# Patient Record
Sex: Female | Born: 1960
Health system: Southern US, Community
[De-identification: ages and names within clinical notes are randomized; demographics above are authoritative.]

## PROBLEM LIST (undated history)

## (undated) DIAGNOSIS — F32A Depression, unspecified: Secondary | ICD-10-CM

## (undated) DIAGNOSIS — Z923 Personal history of irradiation: Secondary | ICD-10-CM

## (undated) DIAGNOSIS — F419 Anxiety disorder, unspecified: Secondary | ICD-10-CM

## (undated) DIAGNOSIS — I10 Essential (primary) hypertension: Secondary | ICD-10-CM

## (undated) DIAGNOSIS — J42 Unspecified chronic bronchitis: Secondary | ICD-10-CM

## (undated) DIAGNOSIS — D509 Iron deficiency anemia, unspecified: Secondary | ICD-10-CM

## (undated) DIAGNOSIS — Z9221 Personal history of antineoplastic chemotherapy: Secondary | ICD-10-CM

## (undated) DIAGNOSIS — J449 Chronic obstructive pulmonary disease, unspecified: Secondary | ICD-10-CM

## (undated) DIAGNOSIS — K859 Acute pancreatitis without necrosis or infection, unspecified: Secondary | ICD-10-CM

## (undated) DIAGNOSIS — M810 Age-related osteoporosis without current pathological fracture: Secondary | ICD-10-CM

## (undated) DIAGNOSIS — F329 Major depressive disorder, single episode, unspecified: Secondary | ICD-10-CM

## (undated) DIAGNOSIS — K759 Inflammatory liver disease, unspecified: Secondary | ICD-10-CM

## (undated) DIAGNOSIS — K219 Gastro-esophageal reflux disease without esophagitis: Secondary | ICD-10-CM

## (undated) DIAGNOSIS — C50919 Malignant neoplasm of unspecified site of unspecified female breast: Secondary | ICD-10-CM

## (undated) DIAGNOSIS — M1990 Unspecified osteoarthritis, unspecified site: Secondary | ICD-10-CM

## (undated) DIAGNOSIS — F17210 Nicotine dependence, cigarettes, uncomplicated: Secondary | ICD-10-CM

## (undated) DIAGNOSIS — I519 Heart disease, unspecified: Secondary | ICD-10-CM

## (undated) DIAGNOSIS — L409 Psoriasis, unspecified: Secondary | ICD-10-CM

## (undated) DIAGNOSIS — G629 Polyneuropathy, unspecified: Secondary | ICD-10-CM

## (undated) DIAGNOSIS — D099 Carcinoma in situ, unspecified: Secondary | ICD-10-CM

## (undated) DIAGNOSIS — G4700 Insomnia, unspecified: Secondary | ICD-10-CM

## (undated) DIAGNOSIS — R0600 Dyspnea, unspecified: Secondary | ICD-10-CM

## (undated) DIAGNOSIS — I509 Heart failure, unspecified: Secondary | ICD-10-CM

## (undated) DIAGNOSIS — G90529 Complex regional pain syndrome I of unspecified lower limb: Secondary | ICD-10-CM

## (undated) DIAGNOSIS — E785 Hyperlipidemia, unspecified: Secondary | ICD-10-CM

## (undated) DIAGNOSIS — E1140 Type 2 diabetes mellitus with diabetic neuropathy, unspecified: Secondary | ICD-10-CM

## (undated) DIAGNOSIS — K5900 Constipation, unspecified: Secondary | ICD-10-CM

## (undated) DIAGNOSIS — R570 Cardiogenic shock: Secondary | ICD-10-CM

## (undated) DIAGNOSIS — C4490 Unspecified malignant neoplasm of skin, unspecified: Secondary | ICD-10-CM

## (undated) HISTORY — DX: Major depressive disorder, single episode, unspecified: F32.9

## (undated) HISTORY — DX: Psoriasis, unspecified: L40.9

## (undated) HISTORY — DX: Anxiety disorder, unspecified: F41.9

## (undated) HISTORY — DX: Heart disease, unspecified: I51.9

## (undated) HISTORY — DX: Carcinoma in situ, unspecified: D09.9

## (undated) HISTORY — DX: Unspecified chronic bronchitis: J42

## (undated) HISTORY — DX: Depression, unspecified: F32.A

## (undated) HISTORY — DX: Unspecified malignant neoplasm of skin, unspecified: C44.90

## (undated) HISTORY — DX: Chronic obstructive pulmonary disease, unspecified: J44.9

## (undated) HISTORY — PX: SHOULDER SURGERY: SHX246

## (undated) HISTORY — DX: Age-related osteoporosis without current pathological fracture: M81.0

## (undated) HISTORY — PX: BREAST BIOPSY: SHX20

## (undated) HISTORY — DX: Inflammatory liver disease, unspecified: K75.9

## (undated) HISTORY — PX: TONSILLECTOMY: SUR1361

## (undated) HISTORY — PX: ABDOMINAL HYSTERECTOMY: SHX81

## (undated) HISTORY — PX: OTHER SURGICAL HISTORY: SHX169

## (undated) HISTORY — DX: Gastro-esophageal reflux disease without esophagitis: K21.9

## (undated) HISTORY — PX: TONSILECTOMY, ADENOIDECTOMY, BILATERAL MYRINGOTOMY AND TUBES: SHX2538

## (undated) HISTORY — PX: COLONOSCOPY: SHX174

## (undated) HISTORY — DX: Malignant neoplasm of unspecified site of unspecified female breast: C50.919

---

## 1997-11-02 ENCOUNTER — Encounter: Payer: Self-pay | Admitting: Emergency Medicine

## 1997-11-03 ENCOUNTER — Encounter: Payer: Self-pay | Admitting: Surgery

## 1997-11-03 ENCOUNTER — Encounter: Payer: Self-pay | Admitting: Emergency Medicine

## 1997-11-03 ENCOUNTER — Inpatient Hospital Stay (HOSPITAL_COMMUNITY): Admission: EM | Admit: 1997-11-03 | Discharge: 1997-11-06 | Payer: Self-pay | Admitting: Emergency Medicine

## 1997-11-04 ENCOUNTER — Encounter: Payer: Self-pay | Admitting: Surgery

## 1997-11-06 ENCOUNTER — Inpatient Hospital Stay
Admission: RE | Admit: 1997-11-06 | Discharge: 1997-11-12 | Payer: Self-pay | Admitting: Physical Medicine and Rehabilitation

## 1997-11-07 ENCOUNTER — Encounter: Payer: Self-pay | Admitting: Physical Medicine and Rehabilitation

## 1997-11-10 ENCOUNTER — Encounter: Payer: Self-pay | Admitting: Orthopedic Surgery

## 1997-11-10 ENCOUNTER — Encounter: Payer: Self-pay | Admitting: Physical Medicine and Rehabilitation

## 1998-04-09 ENCOUNTER — Encounter: Payer: Self-pay | Admitting: Emergency Medicine

## 1998-08-06 ENCOUNTER — Ambulatory Visit (HOSPITAL_COMMUNITY): Admission: RE | Admit: 1998-08-06 | Discharge: 1998-08-06 | Payer: Self-pay | Admitting: Orthopedic Surgery

## 1998-08-06 ENCOUNTER — Encounter: Payer: Self-pay | Admitting: Orthopedic Surgery

## 1998-11-05 ENCOUNTER — Encounter: Payer: Self-pay | Admitting: Orthopedic Surgery

## 1998-11-05 ENCOUNTER — Observation Stay (HOSPITAL_COMMUNITY): Admission: RE | Admit: 1998-11-05 | Discharge: 1998-11-06 | Payer: Self-pay | Admitting: Orthopedic Surgery

## 1999-04-04 ENCOUNTER — Ambulatory Visit (HOSPITAL_COMMUNITY): Admission: RE | Admit: 1999-04-04 | Discharge: 1999-04-04 | Payer: Self-pay | Admitting: Orthopedic Surgery

## 1999-04-04 ENCOUNTER — Encounter: Payer: Self-pay | Admitting: Orthopedic Surgery

## 1999-11-21 ENCOUNTER — Encounter: Payer: Self-pay | Admitting: Orthopedic Surgery

## 1999-11-21 ENCOUNTER — Ambulatory Visit (HOSPITAL_COMMUNITY): Admission: RE | Admit: 1999-11-21 | Discharge: 1999-11-21 | Payer: Self-pay | Admitting: Orthopedic Surgery

## 2007-02-28 HISTORY — PX: BREAST LUMPECTOMY: SHX2

## 2007-04-24 ENCOUNTER — Encounter (INDEPENDENT_AMBULATORY_CARE_PROVIDER_SITE_OTHER): Payer: Self-pay | Admitting: Radiology

## 2007-04-24 ENCOUNTER — Ambulatory Visit (HOSPITAL_COMMUNITY): Admission: RE | Admit: 2007-04-24 | Discharge: 2007-04-24 | Payer: Self-pay | Admitting: Family Medicine

## 2007-05-27 ENCOUNTER — Observation Stay (HOSPITAL_COMMUNITY): Admission: RE | Admit: 2007-05-27 | Discharge: 2007-05-28 | Payer: Self-pay | Admitting: General Surgery

## 2007-05-27 ENCOUNTER — Encounter (INDEPENDENT_AMBULATORY_CARE_PROVIDER_SITE_OTHER): Payer: Self-pay | Admitting: General Surgery

## 2007-05-28 ENCOUNTER — Encounter (INDEPENDENT_AMBULATORY_CARE_PROVIDER_SITE_OTHER): Payer: Self-pay | Admitting: General Surgery

## 2007-07-03 ENCOUNTER — Encounter (HOSPITAL_COMMUNITY): Admission: RE | Admit: 2007-07-03 | Discharge: 2007-08-02 | Payer: Self-pay | Admitting: Oncology

## 2007-07-03 ENCOUNTER — Ambulatory Visit (HOSPITAL_COMMUNITY): Payer: Self-pay | Admitting: Oncology

## 2007-07-08 ENCOUNTER — Ambulatory Visit (HOSPITAL_COMMUNITY): Admission: RE | Admit: 2007-07-08 | Discharge: 2007-07-08 | Payer: Self-pay | Admitting: Oncology

## 2007-07-16 ENCOUNTER — Encounter (HOSPITAL_COMMUNITY): Payer: Self-pay | Admitting: Oncology

## 2007-07-16 ENCOUNTER — Ambulatory Visit: Payer: Self-pay | Admitting: Cardiology

## 2007-07-24 ENCOUNTER — Ambulatory Visit (HOSPITAL_COMMUNITY): Admission: RE | Admit: 2007-07-24 | Discharge: 2007-07-24 | Payer: Self-pay | Admitting: General Surgery

## 2007-08-06 ENCOUNTER — Encounter (HOSPITAL_COMMUNITY): Admission: RE | Admit: 2007-08-06 | Discharge: 2007-09-05 | Payer: Self-pay | Admitting: Oncology

## 2007-08-27 ENCOUNTER — Ambulatory Visit (HOSPITAL_COMMUNITY): Payer: Self-pay | Admitting: Oncology

## 2007-09-02 ENCOUNTER — Ambulatory Visit: Payer: Self-pay | Admitting: Cardiovascular Disease

## 2007-09-02 ENCOUNTER — Encounter (HOSPITAL_COMMUNITY): Payer: Self-pay | Admitting: Oncology

## 2007-09-12 ENCOUNTER — Encounter (HOSPITAL_COMMUNITY): Admission: RE | Admit: 2007-09-12 | Discharge: 2007-10-12 | Payer: Self-pay | Admitting: Oncology

## 2007-10-15 ENCOUNTER — Encounter (HOSPITAL_COMMUNITY): Admission: RE | Admit: 2007-10-15 | Discharge: 2007-11-14 | Payer: Self-pay | Admitting: Oncology

## 2007-10-21 ENCOUNTER — Ambulatory Visit: Admission: RE | Admit: 2007-10-21 | Discharge: 2007-11-12 | Payer: Self-pay | Admitting: Radiation Oncology

## 2007-10-25 ENCOUNTER — Ambulatory Visit (HOSPITAL_COMMUNITY): Payer: Self-pay | Admitting: Oncology

## 2007-11-21 ENCOUNTER — Encounter (HOSPITAL_COMMUNITY): Admission: RE | Admit: 2007-11-21 | Discharge: 2007-12-21 | Payer: Self-pay | Admitting: Oncology

## 2007-12-09 ENCOUNTER — Ambulatory Visit: Admission: RE | Admit: 2007-12-09 | Discharge: 2008-03-08 | Payer: Self-pay | Admitting: Radiation Oncology

## 2007-12-11 ENCOUNTER — Ambulatory Visit (HOSPITAL_COMMUNITY): Payer: Self-pay | Admitting: Oncology

## 2008-01-14 LAB — CBC WITH DIFFERENTIAL/PLATELET
Basophils Absolute: 0 10*3/uL (ref 0.0–0.1)
Eosinophils Absolute: 0.3 10*3/uL (ref 0.0–0.5)
HCT: 38.6 % (ref 34.8–46.6)
HGB: 13.1 g/dL (ref 11.6–15.9)
LYMPH%: 19.1 % (ref 14.0–48.0)
MCV: 92.5 fL (ref 81.0–101.0)
MONO#: 0.4 10*3/uL (ref 0.1–0.9)
MONO%: 5.8 % (ref 0.0–13.0)
NEUT#: 5.3 10*3/uL (ref 1.5–6.5)
Platelets: 216 10*3/uL (ref 145–400)

## 2008-02-04 LAB — CBC WITH DIFFERENTIAL/PLATELET
Basophils Absolute: 0 10*3/uL (ref 0.0–0.1)
Eosinophils Absolute: 0.1 10*3/uL (ref 0.0–0.5)
HGB: 13.3 g/dL (ref 11.6–15.9)
MCV: 89.6 fL (ref 81.0–101.0)
MONO#: 0.2 10*3/uL (ref 0.1–0.9)
MONO%: 3.9 % (ref 0.0–13.0)
NEUT#: 4.1 10*3/uL (ref 1.5–6.5)
RDW: 15.9 % — ABNORMAL HIGH (ref 11.3–14.5)
WBC: 5.6 10*3/uL (ref 3.9–10.0)
lymph#: 1.1 10*3/uL (ref 0.9–3.3)

## 2008-02-05 ENCOUNTER — Ambulatory Visit (HOSPITAL_COMMUNITY): Payer: Self-pay | Admitting: Oncology

## 2008-04-22 ENCOUNTER — Encounter (HOSPITAL_COMMUNITY): Admission: RE | Admit: 2008-04-22 | Discharge: 2008-05-22 | Payer: Self-pay | Admitting: Oncology

## 2008-04-22 ENCOUNTER — Ambulatory Visit (HOSPITAL_COMMUNITY): Payer: Self-pay | Admitting: Oncology

## 2008-05-11 ENCOUNTER — Ambulatory Visit (HOSPITAL_COMMUNITY): Admission: RE | Admit: 2008-05-11 | Discharge: 2008-05-11 | Payer: Self-pay | Admitting: General Surgery

## 2008-08-24 ENCOUNTER — Encounter: Payer: Self-pay | Admitting: Gastroenterology

## 2008-08-25 ENCOUNTER — Encounter: Payer: Self-pay | Admitting: Gastroenterology

## 2008-08-26 ENCOUNTER — Encounter: Payer: Self-pay | Admitting: Gastroenterology

## 2008-08-27 ENCOUNTER — Encounter: Payer: Self-pay | Admitting: Gastroenterology

## 2008-11-01 ENCOUNTER — Encounter: Payer: Self-pay | Admitting: Gastroenterology

## 2008-11-01 ENCOUNTER — Inpatient Hospital Stay (HOSPITAL_COMMUNITY): Admission: EM | Admit: 2008-11-01 | Discharge: 2008-11-03 | Payer: Self-pay | Admitting: Emergency Medicine

## 2008-11-01 ENCOUNTER — Encounter (INDEPENDENT_AMBULATORY_CARE_PROVIDER_SITE_OTHER): Payer: Self-pay | Admitting: *Deleted

## 2008-11-03 ENCOUNTER — Emergency Department (HOSPITAL_COMMUNITY): Admission: EM | Admit: 2008-11-03 | Discharge: 2008-11-03 | Payer: Self-pay | Admitting: Emergency Medicine

## 2008-11-03 ENCOUNTER — Encounter (INDEPENDENT_AMBULATORY_CARE_PROVIDER_SITE_OTHER): Payer: Self-pay | Admitting: *Deleted

## 2008-12-01 ENCOUNTER — Ambulatory Visit: Payer: Self-pay | Admitting: Gastroenterology

## 2008-12-01 DIAGNOSIS — K5900 Constipation, unspecified: Secondary | ICD-10-CM | POA: Insufficient documentation

## 2008-12-01 DIAGNOSIS — C50919 Malignant neoplasm of unspecified site of unspecified female breast: Secondary | ICD-10-CM | POA: Insufficient documentation

## 2008-12-09 ENCOUNTER — Ambulatory Visit (HOSPITAL_COMMUNITY): Payer: Self-pay | Admitting: Oncology

## 2009-01-27 ENCOUNTER — Encounter (HOSPITAL_COMMUNITY): Admission: RE | Admit: 2009-01-27 | Discharge: 2009-02-24 | Payer: Self-pay | Admitting: Oncology

## 2009-01-27 ENCOUNTER — Ambulatory Visit (HOSPITAL_COMMUNITY): Payer: Self-pay | Admitting: Oncology

## 2009-02-08 ENCOUNTER — Ambulatory Visit (HOSPITAL_COMMUNITY): Admission: RE | Admit: 2009-02-08 | Discharge: 2009-02-08 | Payer: Self-pay | Admitting: Oncology

## 2009-05-05 ENCOUNTER — Ambulatory Visit (HOSPITAL_COMMUNITY): Admission: RE | Admit: 2009-05-05 | Discharge: 2009-05-05 | Payer: Self-pay | Admitting: Oncology

## 2009-06-09 ENCOUNTER — Ambulatory Visit (HOSPITAL_COMMUNITY): Payer: Self-pay | Admitting: Oncology

## 2009-06-22 ENCOUNTER — Emergency Department (HOSPITAL_COMMUNITY): Admission: EM | Admit: 2009-06-22 | Discharge: 2009-06-22 | Payer: Self-pay | Admitting: Family Medicine

## 2009-07-19 ENCOUNTER — Encounter (HOSPITAL_COMMUNITY): Admission: RE | Admit: 2009-07-19 | Discharge: 2009-08-18 | Payer: Self-pay | Admitting: Oncology

## 2009-07-22 ENCOUNTER — Encounter: Admission: RE | Admit: 2009-07-22 | Discharge: 2009-10-20 | Payer: Self-pay | Admitting: Orthopedic Surgery

## 2009-09-16 LAB — HEMOGLOBIN A1C: Hgb A1c MFr Bld: 9.2 % — AB (ref 4.0–6.0)

## 2009-09-17 LAB — ANA COMPREHENSIVE PANEL
CPK 2 (MB): 74
ESR: 13
Rheumatoid fact SerPl-aCnc: <13

## 2009-09-29 ENCOUNTER — Encounter: Admission: RE | Admit: 2009-09-29 | Discharge: 2009-09-29 | Payer: Self-pay | Admitting: Rheumatology

## 2009-12-24 ENCOUNTER — Encounter (HOSPITAL_COMMUNITY)
Admission: RE | Admit: 2009-12-24 | Discharge: 2010-01-23 | Payer: Self-pay | Source: Home / Self Care | Admitting: Oncology

## 2009-12-24 ENCOUNTER — Ambulatory Visit (HOSPITAL_COMMUNITY): Payer: Self-pay | Admitting: Oncology

## 2010-01-19 ENCOUNTER — Ambulatory Visit (HOSPITAL_COMMUNITY): Admission: RE | Admit: 2010-01-19 | Discharge: 2010-01-19 | Payer: Self-pay | Admitting: Oncology

## 2010-03-18 ENCOUNTER — Encounter (HOSPITAL_COMMUNITY)
Admission: RE | Admit: 2010-03-18 | Discharge: 2010-03-29 | Payer: Self-pay | Source: Home / Self Care | Attending: Oncology | Admitting: Oncology

## 2010-03-18 ENCOUNTER — Ambulatory Visit (HOSPITAL_COMMUNITY)
Admission: RE | Admit: 2010-03-18 | Discharge: 2010-03-29 | Payer: Self-pay | Source: Home / Self Care | Attending: Oncology | Admitting: Oncology

## 2010-03-19 ENCOUNTER — Encounter (HOSPITAL_COMMUNITY): Payer: Self-pay | Admitting: Oncology

## 2010-03-20 ENCOUNTER — Encounter: Payer: Self-pay | Admitting: General Surgery

## 2010-04-18 ENCOUNTER — Other Ambulatory Visit (HOSPITAL_COMMUNITY): Payer: Self-pay | Admitting: Oncology

## 2010-04-18 DIAGNOSIS — Z9889 Other specified postprocedural states: Secondary | ICD-10-CM

## 2010-05-11 ENCOUNTER — Ambulatory Visit (HOSPITAL_COMMUNITY): Admission: RE | Admit: 2010-05-11 | Payer: Medicare Other | Source: Ambulatory Visit

## 2010-05-11 LAB — CBC
MCH: 29.1 pg (ref 26.0–34.0)
MCV: 87.7 fL (ref 78.0–100.0)
Platelets: 179 10*3/uL (ref 150–400)
RBC: 4.87 MIL/uL (ref 3.87–5.11)

## 2010-05-11 LAB — DIFFERENTIAL
Basophils Absolute: 0 10*3/uL (ref 0.0–0.1)
Lymphocytes Relative: 40 % (ref 12–46)
Neutro Abs: 3.7 10*3/uL (ref 1.7–7.7)
Neutrophils Relative %: 51 % (ref 43–77)

## 2010-05-11 LAB — COMPREHENSIVE METABOLIC PANEL
BUN: 14 mg/dL (ref 6–23)
CO2: 26 mEq/L (ref 19–32)
Chloride: 104 mEq/L (ref 96–112)
Creatinine, Ser: 0.62 mg/dL (ref 0.4–1.2)
GFR calc non Af Amer: >60 mL/min (ref >60)
Total Bilirubin: 0.6 mg/dL (ref 0.3–1.2)

## 2010-05-16 LAB — ANA: Anti Nuclear Antibody(ANA): NEGATIVE

## 2010-05-18 ENCOUNTER — Ambulatory Visit (HOSPITAL_COMMUNITY)
Admission: RE | Admit: 2010-05-18 | Discharge: 2010-05-18 | Disposition: A | Discharge status: Home / Self Care | Payer: Medicare Other | Source: Ambulatory Visit | Attending: Oncology | Admitting: Oncology

## 2010-05-18 DIAGNOSIS — Z9889 Other specified postprocedural states: Secondary | ICD-10-CM

## 2010-05-18 DIAGNOSIS — Z1231 Encounter for screening mammogram for malignant neoplasm of breast: Secondary | ICD-10-CM | POA: Insufficient documentation

## 2010-05-18 DIAGNOSIS — R920 Mammographic microcalcification found on diagnostic imaging of breast: Secondary | ICD-10-CM | POA: Insufficient documentation

## 2010-05-31 LAB — LUTEINIZING HORMONE: LH: 22.9 m[IU]/mL

## 2010-06-03 LAB — POCT I-STAT, CHEM 8
BUN: 10 mg/dL (ref 6–23)
Potassium: 4 mEq/L (ref 3.5–5.1)
Sodium: 139 mEq/L (ref 135–145)
TCO2: 24 mmol/L (ref 0–100)

## 2010-06-03 LAB — DIFFERENTIAL
Eosinophils Relative: 2 % (ref 0–5)
Lymphocytes Relative: 25 % (ref 12–46)
Lymphs Abs: 2.4 10*3/uL (ref 0.7–4.0)
Monocytes Absolute: 0.4 10*3/uL (ref 0.1–1.0)
Monocytes Relative: 4 % (ref 3–12)

## 2010-06-03 LAB — COMPREHENSIVE METABOLIC PANEL
AST: 55 U/L — ABNORMAL HIGH (ref 0–37)
Albumin: 3.1 g/dL — ABNORMAL LOW (ref 3.5–5.2)
Albumin: 3.5 g/dL (ref 3.5–5.2)
BUN: 7 mg/dL (ref 6–23)
Calcium: 8.5 mg/dL (ref 8.4–10.5)
Calcium: 9 mg/dL (ref 8.4–10.5)
Creatinine, Ser: 0.64 mg/dL (ref 0.4–1.2)
Creatinine, Ser: 0.65 mg/dL (ref 0.4–1.2)
GFR calc Af Amer: >60 mL/min (ref >60)
GFR calc non Af Amer: >60 mL/min (ref >60)
Total Protein: 5.9 g/dL — ABNORMAL LOW (ref 6.0–8.3)
Total Protein: 6.4 g/dL (ref 6.0–8.3)

## 2010-06-03 LAB — CBC
HCT: 39.3 % (ref 36.0–46.0)
HCT: 42.7 % (ref 36.0–46.0)
MCHC: 34.6 g/dL (ref 30.0–36.0)
MCV: 93.3 fL (ref 78.0–100.0)
MCV: 93.6 fL (ref 78.0–100.0)
MCV: 94.2 fL (ref 78.0–100.0)
Platelets: 144 10*3/uL — ABNORMAL LOW (ref 150–400)
Platelets: 151 10*3/uL (ref 150–400)
Platelets: 153 10*3/uL (ref 150–400)
RDW: 13.1 % (ref 11.5–15.5)
RDW: 13.6 % (ref 11.5–15.5)
WBC: 5.2 10*3/uL (ref 4.0–10.5)

## 2010-06-03 LAB — URINALYSIS, ROUTINE W REFLEX MICROSCOPIC
Bilirubin Urine: NEGATIVE
Glucose, UA: NEGATIVE mg/dL
Nitrite: NEGATIVE
Specific Gravity, Urine: 1.01 (ref 1.005–1.030)
pH: 5 (ref 5.0–8.0)

## 2010-06-03 LAB — BASIC METABOLIC PANEL
BUN: 7 mg/dL (ref 6–23)
Chloride: 105 mEq/L (ref 96–112)
Glucose, Bld: 104 mg/dL — ABNORMAL HIGH (ref 70–99)
Potassium: 4.1 mEq/L (ref 3.5–5.1)

## 2010-06-03 LAB — LIPASE, BLOOD: Lipase: 28 U/L (ref 11–59)

## 2010-06-03 LAB — POCT CARDIAC MARKERS
Myoglobin, poc: 42.7 ng/mL (ref 12–200)
Troponin i, poc: <0.05 ng/mL (ref 0.00–0.09)

## 2010-06-03 LAB — APTT: aPTT: 23 seconds — ABNORMAL LOW (ref 24–37)

## 2010-06-03 LAB — BRAIN NATRIURETIC PEPTIDE: Pro B Natriuretic peptide (BNP): 36 pg/mL (ref 0.0–100.0)

## 2010-06-03 LAB — GLUCOSE, CAPILLARY
Glucose-Capillary: 100 mg/dL — ABNORMAL HIGH (ref 70–99)
Glucose-Capillary: 109 mg/dL — ABNORMAL HIGH (ref 70–99)
Glucose-Capillary: 110 mg/dL — ABNORMAL HIGH (ref 70–99)

## 2010-06-03 LAB — WET PREP, GENITAL
Trich, Wet Prep: NONE SEEN
Yeast Wet Prep HPF POC: NONE SEEN

## 2010-06-03 LAB — URINE CULTURE: Colony Count: 35000

## 2010-06-03 LAB — CK TOTAL AND CKMB (NOT AT ARMC): Relative Index: INVALID (ref 0.0–2.5)

## 2010-06-03 LAB — TSH: TSH: 6.764 u[IU]/mL — ABNORMAL HIGH (ref 0.350–4.500)

## 2010-06-03 LAB — GC/CHLAMYDIA PROBE AMP, GENITAL: GC Probe Amp, Genital: NEGATIVE

## 2010-06-03 LAB — HEMOGLOBIN A1C: Mean Plasma Glucose: 148 mg/dL

## 2010-06-03 LAB — CARDIAC PANEL(CRET KIN+CKTOT+MB+TROPI)
CK, MB: 1.5 ng/mL (ref 0.3–4.0)
Relative Index: INVALID (ref 0.0–2.5)
Troponin I: 0.01 ng/mL (ref 0.00–0.06)
Troponin I: 0.01 ng/mL (ref 0.00–0.06)

## 2010-07-12 NOTE — H&P (Signed)
Emily [name], Emily [name]               ACCOUNT NO.:  [id number]   MEDICAL RECORD NO.:  [id number]          PATIENT TYPE:  AMB   LOCATION:  DAY                           FACILITY:  [facility]   PHYSICIAN:  Dalia Heading, M.D.  DATE OF BIRTH:  09/03/1960   DATE OF ADMISSION:  DATE OF DISCHARGE:  LH                              HISTORY & PHYSICAL   CHIEF COMPLAINT:  Right breast carcinoma, need for central venous  access.   HISTORY OF PRESENT ILLNESS:  The patient is a 50 year old white female  status post a right partial mastectomy with axillary dissection on May 27, 2007, for right breast carcinoma.  She is about to undergo  chemotherapy and need central venous access.   PAST MEDICAL HISTORY:  Includes non-insulin dependent diabetes mellitus.   PAST SURGICAL HISTORY:  As noted above, tubal ligation and right heel  surgery.   CURRENT MEDICATIONS:  Metformin, glipizide, and Chantix.   ALLERGIES:  No known drug allergies.   REVIEW OF SYSTEMS:  The patient does continue to smoke daily.  She  denies any alcohol use.  She denies any other cardiopulmonary  difficulties or bleeding disorders.   PHYSICAL EXAMINATION:  GENERAL:  The patient is a well-developed and  well-nourished white female, in no acute distress.  LUNGS:  Clear to auscultation with equal breath sounds bilaterally.  HEART:  Reveals a regular rate and rhythm without S3, S4, or murmurs.   IMPRESSION:  Right breast carcinoma, stage II, need for central venous  access.   PLAN:  The patient scheduled for Port-A-Cath insertion on Jul 24, 2007.  The risks and benefits of the procedure including bleeding, infection,  pneumothorax were fully explained to the patient, gave informed consent.      Dalia Heading, M.D.  Electronically Signed     MAJ/MEDQ  D:  07/09/2007  T:  07/10/2007  Job:  [id number]   cc:   Ladona Horns. Mariel Sleet, MD  Fax: [phone]   [facility] Department

## 2010-07-12 NOTE — Op Note (Signed)
NAMEMADDALYN, Emily [name]               ACCOUNT NO.:  [id number]   MEDICAL RECORD NO.:  [id number]          PATIENT TYPE:  AMB   LOCATION:  DAY                           FACILITY:  [facility]   PHYSICIAN:  Dalia Heading, M.D.  DATE OF BIRTH:  05/31/1960   DATE OF PROCEDURE:  07/24/2007  DATE OF DISCHARGE:                               OPERATIVE REPORT   PREOPERATIVE DIAGNOSIS:  Right breast carcinoma, stage II.   POSTOPERATIVE DIAGNOSIS:  Right breast carcinoma, stage II.   PROCEDURE:  Port-A-Cath insertion.   SURGEON:  Dalia Heading, MD   ANESTHESIA:  MAC.   INDICATIONS:  The patient is a 50 year old white female with stage II  right breast carcinoma, who now presents for Port-A-Cath due to the need  for central venous access for chemotherapy.  The risks and benefits of  the procedure including bleeding, infection, and pneumothorax were fully  explained to the patient, gave informed consent.   PROCEDURE NOTE:  The patient was placed in the Trendelenburg position  after the left upper chest was prepped and draped using the usual  sterile technique with Betadine.  Surgical site confirmation was  performed.  Xylocaine 1% was used local anesthesia.   An incision was made below the left clavicle.  Subcutaneous pocket was  then formed.  A needle was advanced into the left subclavian vein using  the Seldinger technique without difficulty.  Guidewire was then advanced  into the right atrium under fluoroscopic guidance.  An introducer and  peel-away sheath were then placed over the guidewire.  The catheter was  inserted through the peel-away sheath and the peel-away sheath was  removed.  The catheter was then attached to the port, and the port was  placed in subcutaneous pocket.  Adequate positioning was confirmed by  fluoroscopy.  The port was flushed with 3000 units of heparin.  The  subcutaneous layer was reapproximated using a 3-0 Vicryl interrupted  suture.  The skin was closed using a  4-0 Vicryl subcuticular suture.  Dermabond was then applied.   All tape and needle counts correct at the end of the procedure.  The  patient was transferred to PACU in stable condition.  Chest x-ray will  be performed at that time.   COMPLICATIONS:  None.   SPECIMEN:  None.   BLOOD LOSS:  Minimal.      Dalia Heading, M.D.  Electronically Signed     MAJ/MEDQ  D:  07/24/2007  T:  07/24/2007  Job:  [id number]   cc:   [facility] Department   Ladona Horns. Mariel Sleet, MD  Fax: [phone]

## 2010-07-12 NOTE — H&P (Signed)
Emily [name], VANDERWEELE               ACCOUNT NO.:  [id number]   MEDICAL RECORD NO.:  [id number]          PATIENT TYPE:  AMB   LOCATION:  DAY                           FACILITY:  [facility]   PHYSICIAN:  Dalia Heading, M.D.  DATE OF BIRTH:  19-May-1960   DATE OF ADMISSION:  DATE OF DISCHARGE:  LH                              HISTORY & PHYSICAL   CHIEF COMPLAINT:  Right breast carcinoma, stage II, finished with  chemotherapy.   HISTORY OF PRESENT ILLNESS:  The patient is a 50 year old white female  who has been treated over the past year for right breast carcinoma.  She  has finished with her chemotherapy and is getting her Port-A-Cath  removed.   PAST MEDICAL HISTORY:  Non-insulin-dependent diabetes mellitus.   PAST SURGICAL HISTORY:  Right partial mastectomy of axillary dissection  in March 2009, tubal ligation, right heel surgery.   CURRENT MEDICATIONS:  Metformin, glipizide, and tamoxifen.   ALLERGIES:  No known drug allergies.   REVIEW OF SYSTEMS:  Noncontributory.   PHYSICAL EXAMINATION:  GENERAL:  The patient is a well-developed, well-  nourished white female in no acute distress.  LUNGS:  Clear to auscultation with equal breath sounds bilaterally.  HEART:  Regular rate and rhythm without S3, S4, or murmurs.  A Port-A-  Cath is in place in the left upper chest.   IMPRESSION:  Stage II right breast carcinoma, finished with  chemotherapy.   PLAN:  The patient is scheduled for Port-A-Cath removal in the minor  procedure room on May 11, 2008.  The risks and benefits of the  procedure were fully explained to the patient, gave informed consent.      Dalia Heading, M.D.  Electronically Signed     MAJ/MEDQ  D:  05/06/2008  T:  05/07/2008  Job:  [id number]   cc:   Ladona Horns. Mariel Sleet, MD  Fax: [phone]   [facility] Department

## 2010-07-12 NOTE — Op Note (Signed)
[name], Emily [name]               ACCOUNT NO.:  [id number]   MEDICAL RECORD NO.:  [id number]          PATIENT TYPE:  AMB   LOCATION:  DAY                           FACILITY:  [facility]   PHYSICIAN:  Dalia Heading, M.D.  DATE OF BIRTH:  May 13, 1960   DATE OF PROCEDURE:  05/11/2008  DATE OF DISCHARGE:                               OPERATIVE REPORT   PREOPERATIVE DIAGNOSIS:  Right breast carcinoma, finished with  chemotherapy.   POSTOPERATIVE DIAGNOSIS:  Right breast carcinoma, finished with  chemotherapy.   PROCEDURE:  Port-A-Cath removal.   SURGEON:  Dr. Dalia Heading, MD   ANESTHESIA:  Local.   INDICATIONS:  The patient is a 50 year old white female who presents for  Port-A-Cath removal.  She has finished with chemotherapy.  The risks and  benefits of the procedure were fully explained to the patient, gave  informed consent.   PROCEDURE NOTE:  The patient was placed in the supine position.  The  left upper chest was prepped and draped using the usual sterile  technique with DuraPrep.  Surgical site confirmation was performed.  Xylocaine 1% was used for local anesthesia.   An incision was made through the previous surgical incision.  This was  taken down to the port.  The port was removed in total without  difficulty.  It was disposed.  No abnormal bleeding was noted.  The  subcutaneous tissue was reapproximated using a 3-0 Vicryl interrupted  suture.  The skin was closed using a 4-0 Vicryl subcuticular suture.  Dermabond was then applied.   All tape and needle counts were correct at the end of the procedure.  The patient was discharged in stable condition.   COMPLICATIONS:  None.   SPECIMEN:  Port-A-Cath, disposed.   BLOOD LOSS:  Minimal.      Dalia Heading, M.D.  Electronically Signed     MAJ/MEDQ  D:  05/11/2008  T:  05/11/2008  Job:  [id number]   cc:   Ladona Horns. Mariel Sleet, MD  Fax: [phone]

## 2010-07-12 NOTE — H&P (Signed)
Emily [name], Emily [name]               ACCOUNT NO.:  [id number]   MEDICAL RECORD NO.:  [id number]          PATIENT TYPE:  AMB   LOCATION:  DAY                           FACILITY:  [facility]   PHYSICIAN:  Dalia Heading, M.D.  DATE OF BIRTH:  25-Dec-1960   DATE OF ADMISSION:  DATE OF DISCHARGE:  LH                              HISTORY & PHYSICAL   CHIEF COMPLAINT:  Right breast carcinoma.   HISTORY OF PRESENT ILLNESS:  Patient is a 50 year old white female who  was referred for evaluation and treatment of a right breast cancer.  She  does have a palpable lump.  A biopsy was performed which revealed  invasive ductal carcinoma.  There is no family history of breast  carcinoma.  No nipple discharge is noted.   PAST MEDICAL HISTORY:  Non-insulin-dependent diabetes mellitus.   PAST SURGICAL HISTORY:  Tubal ligation with removal, bone fusion, right  heel.   CURRENT MEDICATIONS:  Metformin, glipizide, Chantx.   ALLERGIES:  No known drug allergies.   REVIEW OF SYSTEMS:  Patient smokes a pack of cigarettes a day.  She  denies any alcohol use.  She denies any other cardiopulmonary  difficulties or bleeding disorders.   PHYSICAL EXAMINATION:  Patient is a well-developed and well-nourished  white female in no acute distress.  NECK:  Supple without lymphadenopathy.  LUNGS:  Clear to auscultation with equal breath sounds bilaterally.  HEART:  Regular rate and rhythm without S3, S4, or murmurs.  Right breast examination reveals a dominant mass noted in the upper  outer quadrant of the breast.  Some dimpling is noted.  No nipple  discharge is noted.  The axilla is negative for palpable nodes.  Left  breast examination reveals no dominant mass, nipple discharge, or  dimpling.  The axilla is negative for palpable nodes.   The patient was unable to tolerate a preoperative MRI.   IMPRESSION:  Right breast carcinoma.   PLAN:  Patient is scheduled for right partial mastectomy, sentinel lymph  node  biopsy, possible right axillary dissection on May 27, 2007.  The  risks and benefits of the procedure, including bleeding, infection,  pain, nerve injury, and arm swelling were fully explained to the  patient, who gave informed consent.      Dalia Heading, M.D.  Electronically Signed     MAJ/MEDQ  D:  05/23/2007  T:  05/23/2007  Job:  [id number]   cc:   [facility]  Fax: [phone]   [facility] Dept

## 2010-07-12 NOTE — Op Note (Signed)
Emily [name], BACKS               ACCOUNT NO.:  [id number]   MEDICAL RECORD NO.:  [id number]          PATIENT TYPE:  OBV   LOCATION:  A306                          FACILITY:  [facility]   PHYSICIAN:  Dalia Heading, M.D.  DATE OF BIRTH:  1960/03/26   DATE OF PROCEDURE:  05/27/2007  DATE OF DISCHARGE:                               OPERATIVE REPORT   PREOPERATIVE DIAGNOSIS:  Right breast carcinoma.   POSTOPERATIVE DIAGNOSIS:  Right breast carcinoma, axillary metastasis.   PROCEDURE:  Right partial mastectomy with axillary dissection, right  sentinel lymph node biopsy.   SURGEON:  Dr. Franky Macho   ANESTHESIA:  General endotracheal.   INDICATIONS:  The patient is a 50 year old white female who presents  with a biopsy-proven right breast carcinoma in the upper outer quadrant  of the right breast.  She did not tolerate preoperative breast MRI.  She  now comes to the operating for right partial mastectomy with sentinel  lymph node biopsy, possible axillary dissection.  Risks and benefits of  the procedure including bleeding, infection, nerve injury, pain, and the  possibly of an axillary dissection were fully explained to the patient  who gave informed consent.   PROCEDURE NOTE:  The patient was placed in supine position.  She had  earlier undergone radioactive nuclide injection in the preoperative  area.  After induction of general endotracheal anesthesia, the tumor in  the upper, outer quadrant of the right breast was injected with 5 mL of  methylene blue.  This was massaged manually for 5 minutes.  The right  breast and axilla were then prepped and draped in the usual sterile  technique with Betadine.  Surgical site confirmation was performed.   A small incision was made in the right axilla.  Using the [employer], several sentinel lymph nodes were found.  They were very hard  and a couple of them were blue.  A total for lymph nodes were found  which were easily palpable.   Frozen section revealed one of these to be  positive for carcinoma.  The incision was then extended and a level II  completion axillary dissection was performed.  Approximately three more  lymph nodes were found.  Care was taken to avoid the long thoracic nerve  as well as the thoracodorsal artery vein and nerve.  A #10 flat Al Pimple drain was placed into the right axilla, brought through a separate  stab wound inferior to the incision line.  It was secured to skin level  using a 3-0 nylon interrupted suture.  Subcutaneous layer was  reapproximated using 3-0 Vicryl interrupted suture.  The skin was closed  using a 4-0 Vicryl subcuticular suture.  0.5% Sensorcaine was instilled  in the surrounding wound.  Dermabond was then applied.   Next, the palpable mass in the right breast was excised.  It extended  down to but not including the chest wall and pectoralis major muscle  complex.  Normal breast tissue grossly was taken along with the tumor.  A short suture was placed superiorly and a long suture placed laterally  for orientation purposes.  The specimen was sent to pathology for  further examination.  Any bleeding was controlled using Bovie  electrocautery.  The wound was irrigated with normal saline.  The skin  was reapproximated using a 4-0 Vicryl subcuticular suture.  0.5 cm  Sensorcaine was instilled in the surrounding wound.  Dermabond was then  applied.   All tape and needle counts correct at the end of the procedure.  The  patient was extubated in the operating room, went back to recovery room  awake in stable condition.  Complications none.   SPECIMEN:  1. Sentinel lymph nodes.  2. Right axillary lymph nodes.  3. Partial right breast neoplasm.   DRAINS:  Jackson-Pratt drain to right axilla.   BLOOD LOSS:  Less than 100 mL      Dalia Heading, M.D.  Electronically Signed     MAJ/MEDQ  D:  05/27/2007  T:  05/27/2007  Job:  [id number]   cc:   [facility] Department

## 2010-09-16 ENCOUNTER — Ambulatory Visit (HOSPITAL_COMMUNITY): Payer: Self-pay | Admitting: Oncology

## 2010-09-19 ENCOUNTER — Encounter (HOSPITAL_COMMUNITY): Payer: Self-pay | Admitting: Oncology

## 2010-09-19 ENCOUNTER — Encounter (HOSPITAL_COMMUNITY): Payer: Medicare Other | Attending: Oncology | Admitting: Oncology

## 2010-09-19 VITALS — BP 145/90 | HR 93 | Temp 98.0°F | Ht 65.0 in | Wt 170.7 lb

## 2010-09-19 DIAGNOSIS — C50919 Malignant neoplasm of unspecified site of unspecified female breast: Secondary | ICD-10-CM

## 2010-09-19 NOTE — Patient Instructions (Signed)
[facility]  Discharge Instructions  RECOMMENDATIONS MADE BY THE CONSULTANT AND ANY TEST RESULTS WILL BE SENT TO YOUR REFERRING DOCTOR.   EXAM FINDINGS BY MD TODAY AND SIGNS AND SYMPTOMS TO REPORT TO CLINIC OR PRIMARY MD:  Exam per MD.  MEDICATIONS PRESCRIBED: Ativan 1mg  at bedtime #30, 1 refill, we are calling this in to [facility].  INSTRUCTIONS GIVEN AND DISCUSSED: STOP SMOKING!!!!!!  Cut Trazodone in half every other day x 7 days, then stop Trazodone.    SPECIAL INSTRUCTIONS/FOLLOW-UP: Labs in 6 months then to see Dr. Mariel Sleet.    I acknowledge that I have been informed and understand all the instructions given to me and received a copy. I do not have any more questions at this time, but understand that I may call the Specialty Clinic at [facility] at [phone] during business hours should I have any further questions or need assistance in obtaining follow-up care.    __________________________________________  _____________  __________ Signature of Patient or Authorized Representative            Date                   Time    __________________________________________ Nurse's Signature

## 2010-09-19 NOTE — Progress Notes (Signed)
This office note has been dictated.

## 2010-09-19 NOTE — Progress Notes (Signed)
Ativan 1mg  po qhs called into [place] [facility] at 12:57. #30 1 refill.
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
## 2010-10-10 ENCOUNTER — Other Ambulatory Visit (HOSPITAL_COMMUNITY): Payer: Self-pay | Admitting: Oncology

## 2010-10-10 DIAGNOSIS — Z09 Encounter for follow-up examination after completed treatment for conditions other than malignant neoplasm: Secondary | ICD-10-CM

## 2010-11-02 ENCOUNTER — Other Ambulatory Visit: Payer: Self-pay | Admitting: Internal Medicine

## 2010-11-02 DIAGNOSIS — R7401 Elevation of levels of liver transaminase levels: Secondary | ICD-10-CM

## 2010-11-04 ENCOUNTER — Ambulatory Visit
Admission: RE | Admit: 2010-11-04 | Discharge: 2010-11-04 | Disposition: A | Discharge status: Home / Self Care | Payer: Medicare Other | Source: Ambulatory Visit | Attending: Internal Medicine | Admitting: Internal Medicine

## 2010-11-04 DIAGNOSIS — R7401 Elevation of levels of liver transaminase levels: Secondary | ICD-10-CM

## 2010-11-21 LAB — COMPREHENSIVE METABOLIC PANEL
ALT: 41 — ABNORMAL HIGH
BUN: 10
CO2: 31
Calcium: 9.8
Creatinine, Ser: 0.58
GFR calc non Af Amer: >60
Glucose, Bld: 95

## 2010-11-21 LAB — BASIC METABOLIC PANEL
BUN: 8
GFR calc Af Amer: >60
GFR calc non Af Amer: >60
Potassium: 3.7
Sodium: 140

## 2010-11-21 LAB — DIFFERENTIAL
Eosinophils Relative: 1
Lymphocytes Relative: 26
Lymphs Abs: 3.9

## 2010-11-21 LAB — CBC
HCT: 40
HCT: 46.6 — ABNORMAL HIGH
Hemoglobin: 16.1 — ABNORMAL HIGH
MCHC: 34.6
MCV: 88.6
Platelets: 173
RBC: 5.26 — ABNORMAL HIGH
WBC: 14.9 — ABNORMAL HIGH

## 2010-11-21 LAB — HCG, QUANTITATIVE, PREGNANCY: hCG, Beta Chain, Quant, S: 3

## 2010-11-23 ENCOUNTER — Ambulatory Visit (HOSPITAL_COMMUNITY)
Admission: RE | Admit: 2010-11-23 | Discharge: 2010-11-23 | Disposition: A | Discharge status: Home / Self Care | Payer: Medicare Other | Source: Ambulatory Visit | Attending: Oncology | Admitting: Oncology

## 2010-11-23 DIAGNOSIS — R928 Other abnormal and inconclusive findings on diagnostic imaging of breast: Secondary | ICD-10-CM | POA: Insufficient documentation

## 2010-11-23 DIAGNOSIS — Z09 Encounter for follow-up examination after completed treatment for conditions other than malignant neoplasm: Secondary | ICD-10-CM

## 2010-11-23 LAB — BASIC METABOLIC PANEL
CO2: 27
Chloride: 105
Creatinine, Ser: 0.71
GFR calc Af Amer: >60
Sodium: 141

## 2010-11-23 LAB — CBC
HCT: 42.8
Hemoglobin: 15
MCHC: 35
MCV: 88.7
RBC: 4.82

## 2010-11-24 LAB — DIFFERENTIAL
Eosinophils Absolute: 0
Eosinophils Relative: 5
Eosinophils Relative: 0
Eosinophils Relative: 0
Lymphocytes Relative: 38
Lymphocytes Relative: 13
Lymphs Abs: 4
Lymphs Abs: 1.2
Lymphs Abs: 1.8
Monocytes Absolute: 0.4
Monocytes Absolute: 0.5
Monocytes Absolute: 0.6
Neutro Abs: 7.7

## 2010-11-24 LAB — COMPREHENSIVE METABOLIC PANEL
AST: 23
AST: 22
Albumin: 3.7
Albumin: 3.5
BUN: 12
Calcium: 9.2
Calcium: 8.8
Chloride: 105
Creatinine, Ser: 0.66
Creatinine, Ser: 0.68
GFR calc Af Amer: >60
GFR calc Af Amer: >60
GFR calc non Af Amer: >60
Total Protein: 6.3

## 2010-11-24 LAB — CBC
HCT: 40.6
Hemoglobin: 14.4
MCHC: 34.1
MCHC: 34.4
MCV: 90.2
MCV: 87.2
MCV: 88.3
Platelets: 215
Platelets: 161
Platelets: 207
RDW: 12.6
WBC: 7.9
WBC: 9.5

## 2010-11-25 LAB — DIFFERENTIAL
Basophils Absolute: 0
Basophils Absolute: 0
Basophils Absolute: 0
Basophils Relative: 0
Eosinophils Absolute: 0
Eosinophils Absolute: 0
Eosinophils Relative: 0
Eosinophils Relative: 0
Eosinophils Relative: 0
Lymphocytes Relative: 12
Lymphocytes Relative: 15
Lymphocytes Relative: 12
Lymphocytes Relative: 14
Lymphs Abs: 0.9
Lymphs Abs: 1
Lymphs Abs: 0.8
Monocytes Absolute: 0.6
Monocytes Absolute: 0.6
Monocytes Absolute: 0.4
Monocytes Absolute: 0.4
Monocytes Relative: 10
Monocytes Relative: 6
Neutro Abs: 4.2
Neutro Abs: 4.5

## 2010-11-25 LAB — CBC
HCT: 31.2 — ABNORMAL LOW
HCT: 35 — ABNORMAL LOW
HCT: 31.4 — ABNORMAL LOW
Hemoglobin: 10.7 — ABNORMAL LOW
Hemoglobin: 11.9 — ABNORMAL LOW
Hemoglobin: 10.7 — ABNORMAL LOW
Hemoglobin: 11 — ABNORMAL LOW
MCHC: 34.3
MCHC: 34.2
MCV: 90.3
MCV: 91.5
MCV: 93.8
Platelets: 169
Platelets: 205
Platelets: 226
RBC: 3.41 — ABNORMAL LOW
RBC: 3.4 — ABNORMAL LOW
RDW: 14
RDW: 17.4 — ABNORMAL HIGH
RDW: 20.2 — ABNORMAL HIGH
WBC: 5.6
WBC: 5.8

## 2010-11-25 LAB — COMPREHENSIVE METABOLIC PANEL
AST: 21
Albumin: 3.5
BUN: 10
Chloride: 105
Creatinine, Ser: 0.67
GFR calc Af Amer: >60
Total Protein: 5.9 — ABNORMAL LOW

## 2010-11-28 LAB — COMPREHENSIVE METABOLIC PANEL
ALT: 34
AST: 32
Albumin: 3.4 — ABNORMAL LOW
Alkaline Phosphatase: 46
BUN: 11
CO2: 27
Calcium: 8.7
GFR calc Af Amer: >60
Glucose, Bld: 203 — ABNORMAL HIGH
Potassium: 3.4 — ABNORMAL LOW
Sodium: 138
Total Bilirubin: 0.4
Total Protein: 6.1
Total Protein: 5.9 — ABNORMAL LOW

## 2010-11-28 LAB — CBC
HCT: 32.9 — ABNORMAL LOW
HCT: 32.2 — ABNORMAL LOW
Hemoglobin: 11.3 — ABNORMAL LOW
MCHC: 34
MCHC: 34.5
MCV: 101 — ABNORMAL HIGH
MCV: 99.2
MCV: 99.8
Platelets: 222
Platelets: 196
Platelets: 220
RBC: 3.16 — ABNORMAL LOW
RBC: 3.23 — ABNORMAL LOW
RBC: 3.26 — ABNORMAL LOW
RDW: 16 — ABNORMAL HIGH
RDW: 15.7 — ABNORMAL HIGH
WBC: 6.2
WBC: 4.9

## 2010-11-28 LAB — DIFFERENTIAL
Basophils Absolute: 0
Basophils Relative: 1
Basophils Relative: 0
Eosinophils Absolute: 0
Eosinophils Absolute: 0.1
Eosinophils Absolute: 0.1
Eosinophils Relative: 1
Eosinophils Relative: 1
Eosinophils Relative: 1
Lymphocytes Relative: 14
Lymphocytes Relative: 12
Lymphs Abs: 0.9
Lymphs Abs: 0.6 — ABNORMAL LOW
Lymphs Abs: 0.9
Monocytes Absolute: 0.4
Monocytes Relative: 5
Monocytes Relative: 5
Monocytes Relative: 5
Monocytes Relative: 8
Neutro Abs: 4
Neutro Abs: 4.8
Neutrophils Relative %: 75
Neutrophils Relative %: 78 — ABNORMAL HIGH
Neutrophils Relative %: 73

## 2010-11-28 LAB — HEMOGLOBIN A1C: Mean Plasma Glucose: 137

## 2010-11-30 LAB — DIFFERENTIAL
Basophils Relative: 0
Basophils Relative: 0
Eosinophils Absolute: 0.1
Eosinophils Absolute: 0.2
Eosinophils Relative: 3
Lymphs Abs: 0.8
Monocytes Relative: 6
Neutro Abs: 4.5
Neutrophils Relative %: 74
Neutrophils Relative %: 78 — ABNORMAL HIGH

## 2010-11-30 LAB — CBC
HCT: 35 — ABNORMAL LOW
MCHC: 33.8
MCHC: 33.8
MCV: 100.4 — ABNORMAL HIGH
MCV: 99.4
Platelets: 181
Platelets: 227
RBC: 3.22 — ABNORMAL LOW
WBC: 5.9

## 2011-01-08 ENCOUNTER — Other Ambulatory Visit (HOSPITAL_COMMUNITY): Payer: Self-pay | Admitting: Oncology

## 2011-01-15 ENCOUNTER — Other Ambulatory Visit (HOSPITAL_COMMUNITY): Payer: Self-pay | Admitting: Oncology

## 2011-01-22 ENCOUNTER — Other Ambulatory Visit (HOSPITAL_COMMUNITY): Payer: Self-pay | Admitting: Oncology

## 2011-02-09 LAB — HEMOGLOBIN A1C: Hgb A1c MFr Bld: 9 % — AB (ref 4.0–6.0)

## 2011-03-08 ENCOUNTER — Other Ambulatory Visit (HOSPITAL_COMMUNITY): Payer: Self-pay | Admitting: Oncology

## 2011-03-22 ENCOUNTER — Ambulatory Visit (HOSPITAL_COMMUNITY): Payer: Medicare Other | Admitting: Oncology

## 2011-03-22 ENCOUNTER — Other Ambulatory Visit (HOSPITAL_COMMUNITY): Payer: Medicare Other

## 2011-04-04 ENCOUNTER — Encounter (HOSPITAL_COMMUNITY): Payer: Medicare Other | Attending: Oncology | Admitting: Oncology

## 2011-04-04 ENCOUNTER — Other Ambulatory Visit (HOSPITAL_COMMUNITY): Payer: Self-pay

## 2011-04-04 DIAGNOSIS — J449 Chronic obstructive pulmonary disease, unspecified: Secondary | ICD-10-CM

## 2011-04-04 DIAGNOSIS — C50919 Malignant neoplasm of unspecified site of unspecified female breast: Secondary | ICD-10-CM

## 2011-04-04 DIAGNOSIS — J4489 Other specified chronic obstructive pulmonary disease: Secondary | ICD-10-CM

## 2011-04-04 DIAGNOSIS — C50419 Malignant neoplasm of upper-outer quadrant of unspecified female breast: Secondary | ICD-10-CM

## 2011-04-04 NOTE — Patient Instructions (Signed)
[facility]  Discharge Instructions  RECOMMENDATIONS MADE BY THE CONSULTANT AND ANY TEST RESULTS WILL BE SENT TO YOUR REFERRING DOCTOR.   EXAM FINDINGS BY MD TODAY AND SIGNS AND SYMPTOMS TO REPORT TO CLINIC OR PRIMARY MD:  PLEASE QUIT SMOKING!!   INSTRUCTIONS GIVEN AND DISCUSSED: Please have the liver doctor send [place] copies of all labs.  SPECIAL INSTRUCTIONS/FOLLOW-UP: Other (Referral/Appointments)  6 months to see Dr--   I acknowledge that I have been informed and understand all the instructions given to me and received a copy. I do not have any more questions at this time, but understand that I may call the Specialty Clinic at [facility] at [phone] during business hours should I have any further questions or need assistance in obtaining follow-up care.    __________________________________________  _____________  __________ Signature of Patient or Authorized Representative            Date                   Time    __________________________________________ Nurse's Signature
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
## 2011-04-04 NOTE — Progress Notes (Signed)
This office note has been dictated.

## 2011-04-14 ENCOUNTER — Other Ambulatory Visit (HOSPITAL_COMMUNITY): Payer: Self-pay | Admitting: Oncology

## 2011-04-14 DIAGNOSIS — Z9889 Other specified postprocedural states: Secondary | ICD-10-CM

## 2011-04-14 DIAGNOSIS — Z09 Encounter for follow-up examination after completed treatment for conditions other than malignant neoplasm: Secondary | ICD-10-CM

## 2011-05-15 ENCOUNTER — Ambulatory Visit (HOSPITAL_COMMUNITY)
Admission: RE | Admit: 2011-05-15 | Discharge: 2011-05-15 | Disposition: A | Discharge status: Home / Self Care | Payer: Medicare Other | Source: Ambulatory Visit | Attending: Oncology | Admitting: Oncology

## 2011-05-15 DIAGNOSIS — J449 Chronic obstructive pulmonary disease, unspecified: Secondary | ICD-10-CM

## 2011-05-15 DIAGNOSIS — C50919 Malignant neoplasm of unspecified site of unspecified female breast: Secondary | ICD-10-CM

## 2011-05-17 LAB — HEMOGLOBIN A1C
AST: 88 U/L
BUN: 11 mg/dL (ref 4–21)
CO2: 27 mmol/L
Calcium: 9.9 mg/dL
Chloride: 103 mmol/L
Ferritin: 14.6
GFR, Est African American: 127
Glucose: 149
HCT: 41 %
Hgb A1c MFr Bld: 8.5 % — AB (ref 4.0–6.0)
MCH: 28.9
MCV: 84.7 fL (ref 78–100)
Potassium: 4.3 mmol/L
RBC: 4.82
Total Protein: 7.2 g/dL
Vitamin B-12: 260

## 2011-05-24 ENCOUNTER — Ambulatory Visit (HOSPITAL_COMMUNITY)
Admission: RE | Admit: 2011-05-24 | Discharge: 2011-05-24 | Disposition: A | Discharge status: Home / Self Care | Payer: Medicare Other | Source: Ambulatory Visit | Attending: Oncology | Admitting: Oncology

## 2011-05-24 DIAGNOSIS — Z9889 Other specified postprocedural states: Secondary | ICD-10-CM

## 2011-05-24 DIAGNOSIS — Z09 Encounter for follow-up examination after completed treatment for conditions other than malignant neoplasm: Secondary | ICD-10-CM | POA: Insufficient documentation

## 2011-05-24 DIAGNOSIS — R928 Other abnormal and inconclusive findings on diagnostic imaging of breast: Secondary | ICD-10-CM | POA: Insufficient documentation

## 2011-07-03 ENCOUNTER — Telehealth (HOSPITAL_COMMUNITY): Payer: Self-pay | Admitting: *Deleted

## 2011-07-03 NOTE — Telephone Encounter (Signed)
Spoke with Tonia Ghent and she will talk with Dr.Walsh and have her call one of the nurse's here.

## 2011-07-03 NOTE — Telephone Encounter (Signed)
Message copied by Dennie Maizes on Mon Jul 03, 2011  3:54 PM ------      Message from: Mariel Sleet, ERIC S      Created: Mon Jul 03, 2011  3:32 PM       Dr Clent Ridges needs to agree to this-if so I am ok with it      Can you contact [facility]?      ----- Message -----         From: Valentina Shaggy Kela Baccari, RN         Sent: 07/03/2011   2:57 PM           To: Randall An, MD            Message from pt that she wants you to increase her Lorazepam from 1mg  to 2mg  for sleep. Reports she wasn't sleeping well at all and Dr.Walsh RX'd Xanax for her for sleep. Trachelle said that the xanax made her wired and she couldn't sleep at all so she quite taking the xanax. She said she had the lorazepam still on hand and she took one tab one night for sleep but it didn't work. Then she said she decided to try two tabs lorazepam which was a total of 2mg  and that was the best sleep she had in a very long time. Please advise.

## 2011-09-12 LAB — HEMOGLOBIN
Ferritin: 21.7
Hemoglobin: 9 g/dL — AB (ref 11.8–15.5)

## 2011-09-18 ENCOUNTER — Other Ambulatory Visit (HOSPITAL_COMMUNITY): Payer: Self-pay | Admitting: Oncology

## 2011-09-26 ENCOUNTER — Other Ambulatory Visit (HOSPITAL_COMMUNITY): Payer: Self-pay | Admitting: Oncology

## 2011-10-02 ENCOUNTER — Ambulatory Visit (HOSPITAL_COMMUNITY): Payer: Medicare Other

## 2011-10-16 ENCOUNTER — Encounter (HOSPITAL_COMMUNITY): Payer: Medicare Other | Attending: Oncology | Admitting: Oncology

## 2011-10-16 VITALS — BP 139/83 | HR 106 | Temp 98.3°F | Resp 20 | Wt 172.8 lb

## 2011-10-16 DIAGNOSIS — C50419 Malignant neoplasm of upper-outer quadrant of unspecified female breast: Secondary | ICD-10-CM

## 2011-10-16 DIAGNOSIS — J4489 Other specified chronic obstructive pulmonary disease: Secondary | ICD-10-CM

## 2011-10-16 DIAGNOSIS — C50919 Malignant neoplasm of unspecified site of unspecified female breast: Secondary | ICD-10-CM

## 2011-10-16 DIAGNOSIS — G90529 Complex regional pain syndrome I of unspecified lower limb: Secondary | ICD-10-CM

## 2011-10-16 DIAGNOSIS — J449 Chronic obstructive pulmonary disease, unspecified: Secondary | ICD-10-CM

## 2011-10-16 DIAGNOSIS — Z170 Estrogen receptor positive status [ER+]: Secondary | ICD-10-CM

## 2011-10-16 NOTE — Patient Instructions (Signed)
[facility]  Discharge Instructions Emily [name]  DOB 03/15/1960 CSN [id number]  MRN [id number] Dr. Glenford Peers    RECOMMENDATIONS MADE BY THE CONSULTANT AND ANY TEST RESULTS WILL BE SENT TO YOUR REFERRING DOCTOR.   EXAM FINDINGS BY MD TODAY AND SIGNS AND SYMPTOMS TO REPORT TO CLINIC OR PRIMARY MD:   Return in 6 months  Continue Femara   I acknowledge that I have been informed and understand all the instructions given to me and received a copy. I do not have any more questions at this time, but understand that I may call the Specialty [facility] at [facility] at [phone] during business hours should I have any further questions or need assistance in obtaining follow-up care.    __________________________________________  _____________  __________ Signature of Patient or Authorized Representative            Date                   Time    __________________________________________ Nurse's Signature

## 2011-10-16 NOTE — Progress Notes (Signed)
Problem #1 stage IIIa invasive ductal carcinoma the right breast intermediate grade 3.1 cm with 5 of 7 positive nodes with extracapsular extension ER +55% PR +31% HER-2/neu not amplified Ki-67 marker 14% status post a.c. x4 cycles dose dense fashion followed by weekly Taxol x12 followed by radiation therapy. She was started on tamoxifen 03/13/2008 was unable to tolerate tamoxifen and also could not tolerate Arimidex so she is now on letrozole 2.5 mg once a day and she will continue that for total 5 years and started tamoxifen on 03/13/2008. Her chemotherapy and did as of 12/13/2007. Her date of surgery was 05/27/2007.  Problem #2 RSD with chronic right leg pain  Problem #3 COPD still smoking a pack a day they were CAT scan in March did not show tumor  Problem #4 peripheral vascular disease with decreased pulses in her feet  Problem #5 BRCA1 or BRCA2 negativity though she does have a family history of breast cancer. She also status post hysterectomy and oophorectomy by Dr. Ralph Dowdy in August 2011  Problem #6 abnormal liver enzymes for which she was referred to a physician at [facility] who determined that at some point in the distant past she did have hepatitis but that her transaminases have resolved most recently. He fell she had a history of hepatitis E  Unfortunately she cannot walk or exercise well continues to be slightly overweight for her height of course but is sleeping very very well with the Ativan 2 mg at bedtime. She appears to have had a paradoxical reaction to Xanax. It always seem to keep her awake. She has a negative review of systems oncologically at this time. Other vital signs are stable. She has a tender right breast which I suspect will be tender the rest of her life. His been this way since her surgery and radiation therapy. There are no masses. She has nothing in the left breast. She has no adenopathy in any location. Lungs show markedly diminished breath sounds and a small wheeze  anteriorly. Her heart shows a regular rhythm and rate without murmur rub or gallop. Abdomen is obese but without obvious organomegaly. She has no peripheral edema and her skin remains tight over digits and toes.  I cannot convince her to quit smoking even though she states that she wants to.  I will continue the letrozole as mentioned for total 5 years of hormonal therapy and then stop. I will see her in 6 months. She had blood work by Dr. Clent Ridges 2 weeks ago.

## 2011-11-08 LAB — URINALYSIS, DIPSTICK ONLY
Bilirubin (Urine): NEGATIVE
Nitrite: NEGATIVE
Protein: NEGATIVE
Urobilinogen, UA: NORMAL
pH: 5.5

## 2011-11-30 LAB — CBC
Hemoglobin: 15.6 g/dL — AB (ref 11.8–15.5)
Hgb A1c MFr Bld: 8.9 % — AB (ref 4.0–6.0)
MCH: 29.4
Platelets: 202 10*3/uL
RDW: 13.5
WBC: 10.3

## 2011-12-13 LAB — LDL CHOLESTEROL, DIRECT
Chol/HDL Ratio, serum: 4
Direct LDL: 139
Total HDL-C Direct: 51
Triglycerides: 153

## 2011-12-13 LAB — COMPLETE METABOLIC PANEL WITH GFR
ALP, Heat Stable (Liver): 48
AST: 36 U/L
CO2: 29 mmol/L
Calcium: 9.3 mg/dL
Chloride: 102 mmol/L
GFR, Est African American: 137
GFR, Est Non African American: 113
Potassium: 4.2 mmol/L
Total Protein: 6.9 g/dL

## 2011-12-29 ENCOUNTER — Emergency Department (HOSPITAL_COMMUNITY): Payer: Medicare Other

## 2011-12-29 ENCOUNTER — Encounter (HOSPITAL_COMMUNITY): Payer: Self-pay | Admitting: *Deleted

## 2011-12-29 ENCOUNTER — Emergency Department (HOSPITAL_COMMUNITY)
Admission: EM | Admit: 2011-12-29 | Discharge: 2011-12-29 | Disposition: A | Discharge status: Home / Self Care | Payer: Medicare Other | Attending: Emergency Medicine | Admitting: Emergency Medicine

## 2011-12-29 DIAGNOSIS — F17200 Nicotine dependence, unspecified, uncomplicated: Secondary | ICD-10-CM | POA: Insufficient documentation

## 2011-12-29 DIAGNOSIS — K5900 Constipation, unspecified: Secondary | ICD-10-CM

## 2011-12-29 DIAGNOSIS — F329 Major depressive disorder, single episode, unspecified: Secondary | ICD-10-CM | POA: Insufficient documentation

## 2011-12-29 DIAGNOSIS — F3289 Other specified depressive episodes: Secondary | ICD-10-CM | POA: Insufficient documentation

## 2011-12-29 DIAGNOSIS — K219 Gastro-esophageal reflux disease without esophagitis: Secondary | ICD-10-CM | POA: Insufficient documentation

## 2011-12-29 DIAGNOSIS — Z90710 Acquired absence of both cervix and uterus: Secondary | ICD-10-CM | POA: Insufficient documentation

## 2011-12-29 DIAGNOSIS — K859 Acute pancreatitis without necrosis or infection, unspecified: Secondary | ICD-10-CM

## 2011-12-29 DIAGNOSIS — M549 Dorsalgia, unspecified: Secondary | ICD-10-CM | POA: Insufficient documentation

## 2011-12-29 DIAGNOSIS — Z79899 Other long term (current) drug therapy: Secondary | ICD-10-CM | POA: Insufficient documentation

## 2011-12-29 DIAGNOSIS — Z853 Personal history of malignant neoplasm of breast: Secondary | ICD-10-CM | POA: Insufficient documentation

## 2011-12-29 DIAGNOSIS — E119 Type 2 diabetes mellitus without complications: Secondary | ICD-10-CM | POA: Insufficient documentation

## 2011-12-29 DIAGNOSIS — F411 Generalized anxiety disorder: Secondary | ICD-10-CM | POA: Insufficient documentation

## 2011-12-29 LAB — COMPREHENSIVE METABOLIC PANEL
Alkaline Phosphatase: 56 U/L (ref 39–117)
BUN: 10 mg/dL (ref 6–23)
CO2: 26 mEq/L (ref 19–32)
Chloride: 93 mEq/L — ABNORMAL LOW (ref 96–112)
GFR calc Af Amer: >90 mL/min (ref >90)
GFR calc non Af Amer: >90 mL/min (ref >90)
Glucose, Bld: 260 mg/dL — ABNORMAL HIGH (ref 70–99)
Potassium: 3.6 mEq/L (ref 3.5–5.1)
Total Bilirubin: 0.4 mg/dL (ref 0.3–1.2)
Total Protein: 7.3 g/dL (ref 6.0–8.3)

## 2011-12-29 LAB — CBC WITH DIFFERENTIAL/PLATELET
Eosinophils Absolute: 0.2 10*3/uL (ref 0.0–0.7)
Hemoglobin: 15.6 g/dL — ABNORMAL HIGH (ref 12.0–15.0)
Lymphs Abs: 2.7 10*3/uL (ref 0.7–4.0)
MCH: 30.2 pg (ref 26.0–34.0)
Monocytes Relative: 5 % (ref 3–12)
Neutrophils Relative %: 70 % (ref 43–77)
RBC: 5.17 MIL/uL — ABNORMAL HIGH (ref 3.87–5.11)

## 2011-12-29 LAB — URINALYSIS, ROUTINE W REFLEX MICROSCOPIC
Ketones, ur: NEGATIVE mg/dL
Leukocytes, UA: NEGATIVE
Nitrite: NEGATIVE
Protein, ur: NEGATIVE mg/dL
Urobilinogen, UA: 0.2 mg/dL (ref 0.0–1.0)

## 2011-12-29 MED ORDER — ONDANSETRON HCL 4 MG PO TABS: 4.0000 mg | ORAL_TABLET | Freq: Four times a day (QID) | ORAL | Status: DC

## 2011-12-29 MED ORDER — OXYCODONE-ACETAMINOPHEN 5-325 MG PO TABS: 1.0000 | ORAL_TABLET | ORAL | Status: DC | PRN

## 2011-12-29 MED ORDER — SODIUM CHLORIDE 0.9 % IV BOLUS (SEPSIS)
1000.0000 mL | Freq: Once | INTRAVENOUS | Status: AC
  Administered 2011-12-29: 1000 mL via INTRAVENOUS

## 2011-12-29 MED ORDER — IOHEXOL 300 MG/ML  SOLN
100.0000 mL | Freq: Once | INTRAMUSCULAR | Status: AC | PRN
  Administered 2011-12-29: 100 mL via INTRAVENOUS

## 2011-12-29 NOTE — ED Notes (Signed)
Pt states "she did a lumbar thing to see if I have arthritis in my back, which I do, been taking cyclobenzaprine, I've been having stomach pain since last Friday, she's had me taking miralax, mag cit, vegetable laxs, warm prune juice and I still haven't had a BM"; pt presents with hypoactive BS all quads.

## 2011-12-29 NOTE — ED Notes (Signed)
Pt discharged home with daughter; pt given and explained all discharge instructions and prescriptions; pt stated understanding and denied questions/concerns; pt stable at time of discharge

## 2011-12-29 NOTE — ED Provider Notes (Signed)
History     CSN: [id number]  Arrival date & time 12/29/11  1103   First MD Initiated Contact with Patient 12/29/11 1130      Chief Complaint  Patient presents with  . Abdominal Pain  . Back Pain    (Consider location/radiation/quality/duration/timing/severity/associated sxs/prior treatment) HPI Pt has ongoing constipation and has tried multiple different laxatives. States she had a small BM yesterday. Pt get cramping pain across uppper abd and into back. Currently pain free. No fever chills, N/V, chest pain, SOB. No weakness, numbness or urinary symptoms Past Medical History  Diagnosis Date  . Breast cancer     rt breast  . Anxiety   . Depression   . Diabetes mellitus   . GERD (gastroesophageal reflux disease)     Past Surgical History  Procedure Date  . Abdominal hysterectomy     complete  . Tonsilectomy, adenoidectomy, bilateral myringotomy and tubes   . Bone fusion rt heel     Family History  Problem Relation Age of Onset  . Cancer Mother     History  Substance Use Topics  . Smoking status: Current Every Day Smoker -- 1.0 packs/day for 35 years    Types: Cigarettes  . Smokeless tobacco: Not on file  . Alcohol Use: No    OB History    Grav Para Term Preterm Abortions TAB SAB Ect Mult Living                  Review of Systems  Constitutional: Negative for fever and chills.  Respiratory: Negative for shortness of breath.   Gastrointestinal: Positive for abdominal pain and constipation. Negative for nausea, vomiting and diarrhea.  Genitourinary: Positive for flank pain. Negative for dysuria, frequency, hematuria and difficulty urinating.  Musculoskeletal: Positive for myalgias and back pain.  Skin: Negative for rash and wound.  Neurological: Negative for dizziness, weakness, light-headedness and numbness.    Allergies  Gadolinium  Home Medications   Current Outpatient Rx  Name Route Sig Dispense Refill  . ALBUTEROL SULFATE HFA 108 (90 BASE)  MCG/ACT IN AERS Inhalation Inhale 2 puffs into the lungs every 4 (four) hours as needed. For shortness of breath or wheezing    . ALENDRONATE SODIUM 70 MG PO TABS Oral Take 70 mg by mouth every 7 (seven) days. Takes on Sundays Take with a full glass of water on an empty stomach.    Marland Kitchen CALCIUM CARBONATE-VITAMIN D 500-200 MG-UNIT PO TABS Oral Take 1 tablet by mouth 2 (two) times daily.    . CYCLOBENZAPRINE HCL 10 MG PO TABS Oral Take 10 mg by mouth at bedtime.    Marland Kitchen DICLOFENAC SODIUM 1 % TD GEL Topical Apply 1 application topically 4 (four) times daily as needed. For arthritic pain in hands    . FERROUS SULFATE 325 (65 FE) MG PO TABS Oral Take 325 mg by mouth at bedtime. (OTC iron)    . FLUTICASONE-SALMETEROL 100-50 MCG/DOSE IN AEPB Inhalation Inhale 1 puff into the lungs every 12 (twelve) hours.    Marland Kitchen GABAPENTIN 400 MG PO TABS Oral Take 800 mg by mouth 3 (three) times daily.      Marland Kitchen GLIPIZIDE 10 MG PO TABS Oral Take 10-20 mg by mouth 2 (two) times daily before a meal. Take 2 tablets by mouth in the morning and then 1 tablet in the evening    . HYDROXYZINE HCL 25 MG PO TABS Oral Take 25 mg by mouth every 6 (six) hours as needed. For itching    .  LETROZOLE 2.5 MG PO TABS Oral Take 2.5 mg by mouth every morning.     Marland Kitchen LORAZEPAM 2 MG PO TABS Oral Take 2 mg by mouth at bedtime. By Dr. Clent Ridges    . METFORMIN HCL 1000 MG PO TABS Oral Take 1,000 mg by mouth 2 (two) times daily with a meal.     . OMEPRAZOLE 20 MG PO CPDR Oral Take 20 mg by mouth every morning.     Marland Kitchen POLYETHYLENE GLYCOL 3350 PO PACK Oral Take 17 g by mouth 2 (two) times daily.     Marland Kitchen PRAVASTATIN SODIUM 20 MG PO TABS Oral Take 20 mg by mouth every morning.     . SENNA 8.6 MG PO TABS Oral Take 4 tablets by mouth every morning.     Marland Kitchen SERTRALINE HCL 100 MG PO TABS Oral Take 100 mg by mouth at bedtime.     Marland Kitchen VITAMIN B-12 1000 MCG PO TABS Oral Take 1,000 mcg by mouth every morning.     Marland Kitchen ONDANSETRON HCL 4 MG PO TABS Oral Take 1 tablet (4 mg total) by  mouth every 6 (six) hours. 12 tablet 0  . OXYCODONE-ACETAMINOPHEN 5-325 MG PO TABS Oral Take 1 tablet by mouth every 4 (four) hours as needed for pain. 15 tablet 0    BP 141/86  Pulse 100  Temp 97.8 F (36.6 C) (Oral)  Resp 18  Wt 168 lb (76.204 kg)  SpO2 95%  Physical Exam  Nursing note and vitals reviewed. Constitutional: She is oriented to person, place, and time. She appears well-developed and well-nourished. No distress.  HENT:  Head: Normocephalic and atraumatic.  Mouth/Throat: Oropharynx is clear and moist.  Eyes: EOM are normal. Pupils are equal, round, and reactive to light.  Neck: Normal range of motion. Neck supple.  Cardiovascular: Normal rate and regular rhythm.   Pulmonary/Chest: Effort normal and breath sounds normal. No respiratory distress. She has no wheezes. She has no rales.  Abdominal: Soft. She exhibits distension (mildly distended). She exhibits no mass. There is no tenderness. There is no rebound and no guarding.       diminished BS  Musculoskeletal: Normal range of motion. She exhibits no edema and no tenderness.  Neurological: She is alert and oriented to person, place, and time.       5/5 motor in all ext. Sensation intact  Skin: Skin is warm and dry. No rash noted. No erythema.  Psychiatric: She has a normal mood and affect. Her behavior is normal.    ED Course  Procedures (including critical care time)  Labs Reviewed  CBC WITH DIFFERENTIAL - Abnormal; Notable for the following:    WBC 11.6 (*)     RBC 5.17 (*)     Hemoglobin 15.6 (*)     Neutro Abs 8.2 (*)     All other components within normal limits  COMPREHENSIVE METABOLIC PANEL - Abnormal; Notable for the following:    Sodium 131 (*)     Chloride 93 (*)     Glucose, Bld 260 (*)     Creatinine, Ser 0.48 (*)     All other components within normal limits  URINALYSIS, ROUTINE W REFLEX MICROSCOPIC - Abnormal; Notable for the following:    Glucose, UA 100 (*)     All other components within  normal limits  LIPASE, BLOOD - Abnormal; Notable for the following:    Lipase 1206 (*)     All other components within normal limits   Ct Abdomen  Pelvis W Contrast  12/29/2011  *RADIOLOGY REPORT*  Clinical Data: Abdominal pain and nausea for 4 days.  History breast cancer with right-sided lumpectomy in 2009.  Hysterectomy.  CT ABDOMEN AND PELVIS WITH CONTRAST  Technique:  Multidetector CT imaging of the abdomen and pelvis was performed following the standard protocol during bolus administration of intravenous contrast.  Contrast: OMNIPAQUE IOHEXOL 300 MG/ML  SOLN  Comparison: Acute abdomen series earlier today.  A CT of 10/22/2009 from [facility] is reviewed.  Findings: Suspect mild centrilobular emphysema with areas of subpleural scarring in both lung bases.  Mild cardiomegaly, without pericardial or pleural effusion.  The distal esophagus appears thick-walled, mild, on image 10.  Mild hepatic steatosis and hepatomegaly, with the liver measuring 18.2 cm cranial caudal. No focal liver lesion.  Normal spleen.  The proximal stomach is underdistended.  There is edema centered about the pancreatic head/uncinate process and descending duodenum. Example image 34.  No well-defined peripancreatic fluid collection.  No pancreatic ductal dilatation.  Normal appearance of the gallbladder, without biliary ductal dilatation or evidence of common duct stone.  Prominent nodes in the porta hepatis are likely related to hepatic steatosis.  Normal adrenal glands.  Tiny bilateral renal lesions which are likely cysts.  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  A circumaortic left renal vein.  Age advanced aortic atherosclerosis. No retroperitoneal or retrocrural adenopathy.  Fluid-filled colon, suggesting a diarrheal state. Normal terminal ileum.  Appendix is not visualized but there is no evidence of right lower quadrant inflammation.  Normal small bowel without abdominal ascites.  No pelvic adenopathy.   Suspect mild pelvic floor laxity. Hysterectomy.  Normal urinary bladder. No adnexal mass or significant free fluid. Mild osteopenia.  IMPRESSION: 1.  Edema centered about the pancreatic head/uncinate process and descending duodenum.  Favored to represent uncomplicated mild pancreatitis.  Duodenitis either primary or secondary, could look similar.  Correlate with pancreatic enzymes. 2.  Mild hepatic steatosis and hepatomegaly.  Prominent porta hepatis nodes are likely reactive and secondary. 3.  Question distal esophagitis. 4.  Fluid filled colon, suggesting a diarrheal state.   Original Report Authenticated By: Jeronimo Greaves, M.D.    Dg Abd Acute W/chest  12/29/2011  *RADIOLOGY REPORT*  Clinical Data: 51 year old female upper abdominal pain, constipation.  ACUTE ABDOMEN SERIES (ABDOMEN 2 VIEW & CHEST 1 VIEW)  Comparison: Lumbar radiographs 12/13/2011 and earlier.  Findings: Stable lung volumes.  Cardiac size and mediastinal contours are within normal limits.  Visualized tracheal air column is within normal limits.  No pneumothorax or pneumoperitoneum.  No acute pulmonary opacity.  Air-fluid levels throughout the abdomen with most appearing to be within colon.  Distal gas in the rectum.  No dilated small bowel loops.  Abdominal and pelvic visceral contours are within normal limits.  Pelvic phleboliths.  Osteopenia. No acute osseous abnormality identified.  IMPRESSION: 1. Nonobstructed bowel gas pattern, no free air.  Air-fluid levels in the abdomen mostly appear to be within colon such as due to diarrhea or enema use. 2. No acute cardiopulmonary abnormality.   Original Report Authenticated By: Erskine Speed, M.D.      1. Pancreatitis   2. Constipation       MDM   Pt is feeling better. No vomiting in ED. Tachycardia is improved. Discussed CT findings with Dr Dulce Sellar. Thinks outpt ERCP is reasonable given the mildness of her symptoms. Advises liquid diet and call office for appointment on Monday.  Loren Racer, MD 12/29/11 [phone]

## 2011-12-31 ENCOUNTER — Observation Stay (HOSPITAL_COMMUNITY)
Admission: EM | Admit: 2011-12-31 | Discharge: 2012-01-02 | Disposition: A | Discharge status: Home / Self Care | Payer: Medicare Other | Attending: Internal Medicine | Admitting: Internal Medicine

## 2011-12-31 ENCOUNTER — Encounter (HOSPITAL_COMMUNITY): Payer: Self-pay | Admitting: Emergency Medicine

## 2011-12-31 ENCOUNTER — Emergency Department (HOSPITAL_COMMUNITY): Payer: Medicare Other

## 2011-12-31 DIAGNOSIS — E1165 Type 2 diabetes mellitus with hyperglycemia: Secondary | ICD-10-CM | POA: Diagnosis present

## 2011-12-31 DIAGNOSIS — E1149 Type 2 diabetes mellitus with other diabetic neurological complication: Secondary | ICD-10-CM | POA: Diagnosis present

## 2011-12-31 DIAGNOSIS — K859 Acute pancreatitis without necrosis or infection, unspecified: Principal | ICD-10-CM | POA: Insufficient documentation

## 2011-12-31 DIAGNOSIS — Z79899 Other long term (current) drug therapy: Secondary | ICD-10-CM | POA: Insufficient documentation

## 2011-12-31 DIAGNOSIS — IMO0001 Reserved for inherently not codable concepts without codable children: Secondary | ICD-10-CM | POA: Insufficient documentation

## 2011-12-31 DIAGNOSIS — R109 Unspecified abdominal pain: Secondary | ICD-10-CM

## 2011-12-31 DIAGNOSIS — C50919 Malignant neoplasm of unspecified site of unspecified female breast: Secondary | ICD-10-CM

## 2011-12-31 DIAGNOSIS — IMO0002 Reserved for concepts with insufficient information to code with codable children: Secondary | ICD-10-CM

## 2011-12-31 DIAGNOSIS — E119 Type 2 diabetes mellitus without complications: Secondary | ICD-10-CM

## 2011-12-31 DIAGNOSIS — Z8719 Personal history of other diseases of the digestive system: Secondary | ICD-10-CM | POA: Diagnosis present

## 2011-12-31 DIAGNOSIS — K219 Gastro-esophageal reflux disease without esophagitis: Secondary | ICD-10-CM | POA: Insufficient documentation

## 2011-12-31 HISTORY — DX: Acute pancreatitis without necrosis or infection, unspecified: K85.90

## 2011-12-31 LAB — URINALYSIS, ROUTINE W REFLEX MICROSCOPIC
Hgb urine dipstick: NEGATIVE
Leukocytes, UA: NEGATIVE
Protein, ur: NEGATIVE mg/dL
Specific Gravity, Urine: 1.009 (ref 1.005–1.030)
Urobilinogen, UA: 0.2 mg/dL (ref 0.0–1.0)

## 2011-12-31 LAB — COMPREHENSIVE METABOLIC PANEL
ALT: 26 U/L (ref 0–35)
AST: 24 U/L (ref 0–37)
Calcium: 9.5 mg/dL (ref 8.4–10.5)
Creatinine, Ser: 0.46 mg/dL — ABNORMAL LOW (ref 0.50–1.10)
GFR calc Af Amer: >90 mL/min (ref >90)
Glucose, Bld: 239 mg/dL — ABNORMAL HIGH (ref 70–99)
Sodium: 135 mEq/L (ref 135–145)
Total Protein: 7.3 g/dL (ref 6.0–8.3)

## 2011-12-31 LAB — CBC
MCH: 30.2 pg (ref 26.0–34.0)
MCHC: 34.8 g/dL (ref 30.0–36.0)
MCV: 86.8 fL (ref 78.0–100.0)
Platelets: 195 10*3/uL (ref 150–400)

## 2011-12-31 MED ORDER — HYDROCODONE-ACETAMINOPHEN 5-325 MG PO TABS
1.0000 | ORAL_TABLET | ORAL | Status: DC | PRN
  Administered 2011-12-31: 2 via ORAL
  Filled 2011-12-31: qty 2

## 2011-12-31 MED ORDER — LETROZOLE 2.5 MG PO TABS
2.5000 mg | ORAL_TABLET | Freq: Every morning | ORAL | Status: DC
  Administered 2012-01-01 – 2012-01-02 (×2): 2.5 mg via ORAL
  Filled 2011-12-31 (×3): qty 1

## 2011-12-31 MED ORDER — ACETAMINOPHEN 650 MG RE SUPP: 650.0000 mg | Freq: Four times a day (QID) | RECTAL | Status: DC | PRN

## 2011-12-31 MED ORDER — CYCLOBENZAPRINE HCL 10 MG PO TABS
10.0000 mg | ORAL_TABLET | Freq: Every day | ORAL | Status: DC
  Administered 2011-12-31 – 2012-01-01 (×2): 10 mg via ORAL
  Filled 2011-12-31 (×3): qty 1

## 2011-12-31 MED ORDER — ALBUTEROL SULFATE HFA 108 (90 BASE) MCG/ACT IN AERS: 2.0000 | INHALATION_SPRAY | RESPIRATORY_TRACT | Status: DC | PRN

## 2011-12-31 MED ORDER — SODIUM CHLORIDE 0.9 % IV SOLN
80.0000 mg | Freq: Once | INTRAVENOUS | Status: AC
  Administered 2011-12-31: 80 mg via INTRAVENOUS
  Filled 2011-12-31: qty 80

## 2011-12-31 MED ORDER — SODIUM CHLORIDE 0.9 % IV BOLUS (SEPSIS)
1000.0000 mL | Freq: Once | INTRAVENOUS | Status: AC
  Administered 2011-12-31: 1000 mL via INTRAVENOUS

## 2011-12-31 MED ORDER — HYDROMORPHONE HCL PF 1 MG/ML IJ SOLN: 1.0000 mg | INTRAMUSCULAR | Status: DC | PRN

## 2011-12-31 MED ORDER — HYDROXYZINE HCL 25 MG PO TABS
25.0000 mg | ORAL_TABLET | Freq: Four times a day (QID) | ORAL | Status: DC | PRN
  Filled 2011-12-31: qty 1

## 2011-12-31 MED ORDER — GABAPENTIN 800 MG PO TABS: 800.0000 mg | ORAL_TABLET | Freq: Three times a day (TID) | ORAL | Status: DC

## 2011-12-31 MED ORDER — GABAPENTIN 400 MG PO CAPS
800.0000 mg | ORAL_CAPSULE | Freq: Three times a day (TID) | ORAL | Status: DC
  Administered 2011-12-31 – 2012-01-02 (×6): 800 mg via ORAL
  Filled 2011-12-31 (×8): qty 2

## 2011-12-31 MED ORDER — ONDANSETRON HCL 4 MG/2ML IJ SOLN
4.0000 mg | Freq: Once | INTRAMUSCULAR | Status: AC
  Administered 2011-12-31: 4 mg via INTRAVENOUS
  Filled 2011-12-31: qty 2

## 2011-12-31 MED ORDER — INSULIN ASPART 100 UNIT/ML ~~LOC~~ SOLN
0.0000 [IU] | Freq: Four times a day (QID) | SUBCUTANEOUS | Status: DC
  Administered 2012-01-01: 3 [IU] via SUBCUTANEOUS

## 2011-12-31 MED ORDER — HYDROMORPHONE HCL PF 1 MG/ML IJ SOLN
1.0000 mg | Freq: Once | INTRAMUSCULAR | Status: AC
  Administered 2011-12-31: 1 mg via INTRAVENOUS
  Filled 2011-12-31: qty 1

## 2011-12-31 MED ORDER — MORPHINE SULFATE 2 MG/ML IJ SOLN
2.0000 mg | INTRAMUSCULAR | Status: DC | PRN
  Administered 2011-12-31: 2 mg via INTRAVENOUS
  Filled 2011-12-31 (×2): qty 1

## 2011-12-31 MED ORDER — ENOXAPARIN SODIUM 40 MG/0.4ML ~~LOC~~ SOLN
40.0000 mg | SUBCUTANEOUS | Status: DC
  Administered 2011-12-31: 40 mg via SUBCUTANEOUS
  Filled 2011-12-31 (×4): qty 0.4

## 2011-12-31 MED ORDER — ACETAMINOPHEN 325 MG PO TABS
650.0000 mg | ORAL_TABLET | Freq: Four times a day (QID) | ORAL | Status: DC | PRN
  Administered 2012-01-01: 650 mg via ORAL
  Filled 2011-12-31: qty 2

## 2011-12-31 MED ORDER — SENNA 8.6 MG PO TABS
4.0000 | ORAL_TABLET | Freq: Every morning | ORAL | Status: DC
  Administered 2012-01-01: 34.4 mg via ORAL
  Filled 2011-12-31: qty 1
  Filled 2011-12-31: qty 3

## 2011-12-31 MED ORDER — ONDANSETRON HCL 4 MG PO TABS: 4.0000 mg | ORAL_TABLET | Freq: Four times a day (QID) | ORAL | Status: DC | PRN

## 2011-12-31 MED ORDER — ALUM & MAG HYDROXIDE-SIMETH 200-200-20 MG/5ML PO SUSP: 30.0000 mL | Freq: Four times a day (QID) | ORAL | Status: DC | PRN

## 2011-12-31 MED ORDER — GLIPIZIDE 10 MG PO TABS
10.0000 mg | ORAL_TABLET | Freq: Two times a day (BID) | ORAL | Status: DC
  Administered 2011-12-31: 10 mg via ORAL
  Filled 2011-12-31 (×4): qty 1

## 2011-12-31 MED ORDER — ONDANSETRON HCL 4 MG/2ML IJ SOLN: 4.0000 mg | Freq: Four times a day (QID) | INTRAMUSCULAR | Status: DC | PRN

## 2011-12-31 MED ORDER — SERTRALINE HCL 100 MG PO TABS
100.0000 mg | ORAL_TABLET | Freq: Every day | ORAL | Status: DC
  Administered 2011-12-31 – 2012-01-01 (×2): 100 mg via ORAL
  Filled 2011-12-31 (×3): qty 1

## 2011-12-31 MED ORDER — SODIUM CHLORIDE 0.9 % IV SOLN
INTRAVENOUS | Status: AC
  Administered 2011-12-31: 14:00:00 via INTRAVENOUS

## 2011-12-31 MED ORDER — POTASSIUM CHLORIDE IN NACL 20-0.9 MEQ/L-% IV SOLN
INTRAVENOUS | Status: DC
  Administered 2011-12-31 – 2012-01-01 (×2): 1000 mL via INTRAVENOUS
  Filled 2011-12-31 (×3): qty 1000

## 2011-12-31 MED ORDER — INSULIN ASPART 100 UNIT/ML ~~LOC~~ SOLN
0.0000 [IU] | Freq: Three times a day (TID) | SUBCUTANEOUS | Status: DC
  Administered 2011-12-31: 1 [IU] via SUBCUTANEOUS

## 2011-12-31 MED ORDER — ONDANSETRON HCL 4 MG/2ML IJ SOLN: 4.0000 mg | Freq: Three times a day (TID) | INTRAMUSCULAR | Status: DC | PRN

## 2011-12-31 MED ORDER — MORPHINE SULFATE 2 MG/ML IJ SOLN
2.0000 mg | INTRAMUSCULAR | Status: DC | PRN
  Administered 2012-01-01: 2 mg via INTRAVENOUS
  Filled 2011-12-31: qty 1

## 2011-12-31 MED ORDER — MOMETASONE FURO-FORMOTEROL FUM 100-5 MCG/ACT IN AERO
2.0000 | INHALATION_SPRAY | Freq: Two times a day (BID) | RESPIRATORY_TRACT | Status: DC
  Administered 2011-12-31 – 2012-01-02 (×4): 2 via RESPIRATORY_TRACT
  Filled 2011-12-31: qty 8.8

## 2011-12-31 NOTE — ED Provider Notes (Signed)
History     CSN: [id number]  Arrival date & time 12/31/11  4098   First MD Initiated Contact with Patient 12/31/11 [phone]      Chief Complaint  Patient presents with  . Abdominal Pain  . Pancreatitis    (Consider location/radiation/quality/duration/timing/severity/associated sxs/prior treatment) Patient is a 51 y.o. female presenting with abdominal pain. The history is provided by the patient.  Abdominal Pain The primary symptoms of the illness include abdominal pain. The primary symptoms of the illness do not include fever, shortness of breath or dysuria.  Symptoms associated with the illness do not include chills or back pain.  pt w epigastric pain radiating to back for past several days. Was in ed 2 days ago for same, was dx w pancreatitis but states did not want to stay then. Since d/c, despite clear liquid diet and pain meds, states uncontrolled epigastric pain, constant, dull, severe, radiating to back, worse w palpation. Nausea. No vomiting. Had normal bm 2 days ago. Only prior abd surgery is hysterectomy. States hx gallstones. No prior hx pancreatitis. No hx pud. Denies etoh use. No fever or chills. No abd trauma or fall. No cp or sob.      Past Medical History  Diagnosis Date  . Breast cancer     rt breast  . Anxiety   . Depression   . Diabetes mellitus   . GERD (gastroesophageal reflux disease)   . Pancreatitis     Past Surgical History  Procedure Date  . Abdominal hysterectomy     complete  . Tonsilectomy, adenoidectomy, bilateral myringotomy and tubes   . Bone fusion rt heel     Family History  Problem Relation Age of Onset  . Cancer Mother     History  Substance Use Topics  . Smoking status: Current Every Day Smoker -- 1.0 packs/day for 35 years    Types: Cigarettes  . Smokeless tobacco: Not on file  . Alcohol Use: No    OB History    Grav Para Term Preterm Abortions TAB SAB Ect Mult Living                  Review of Systems  Constitutional:  Negative for fever and chills.  HENT: Negative for neck pain.   Eyes: Negative for redness.  Respiratory: Negative for cough and shortness of breath.   Cardiovascular: Negative for chest pain and leg swelling.  Gastrointestinal: Positive for abdominal pain.  Genitourinary: Negative for dysuria and flank pain.  Musculoskeletal: Negative for back pain.  Skin: Negative for rash.  Neurological: Negative for headaches.  Hematological: Does not bruise/bleed easily.  Psychiatric/Behavioral: Negative for confusion.    Allergies  Gadolinium  Home Medications   Current Outpatient Rx  Name  Route  Sig  Dispense  Refill  . ALBUTEROL SULFATE HFA 108 (90 BASE) MCG/ACT IN AERS   Inhalation   Inhale 2 puffs into the lungs every 4 (four) hours as needed. For shortness of breath or wheezing         . ALENDRONATE SODIUM 70 MG PO TABS   Oral   Take 70 mg by mouth every Sunday. Take with a full glass of water on an empty stomach.         Marland Kitchen CALCIUM CARBONATE-VITAMIN D 500-200 MG-UNIT PO TABS   Oral   Take 1 tablet by mouth 2 (two) times daily.         . CYCLOBENZAPRINE HCL 10 MG PO TABS   Oral  Take 10 mg by mouth at bedtime.         Marland Kitchen DICLOFENAC SODIUM 1 % TD GEL   Topical   Apply 1 application topically 4 (four) times daily as needed. Apply to painful areas on hands for arthritis         . FERROUS SULFATE 325 (65 FE) MG PO TABS   Oral   Take 325 mg by mouth at bedtime. (OTC iron)         . FLUTICASONE-SALMETEROL 100-50 MCG/DOSE IN AEPB   Inhalation   Inhale 1 puff into the lungs every 12 (twelve) hours.         Marland Kitchen GABAPENTIN 400 MG PO TABS   Oral   Take 800 mg by mouth 3 (three) times daily.           Marland Kitchen GLIPIZIDE 10 MG PO TABS   Oral   Take 10-20 mg by mouth 2 (two) times daily before a meal. Take 2 tablets by mouth in the morning and then 1 tablet in the evening         . HYDROXYZINE HCL 25 MG PO TABS   Oral   Take 25 mg by mouth every 6 (six) hours as  needed. For itching         . LETROZOLE 2.5 MG PO TABS   Oral   Take 2.5 mg by mouth every morning.          Marland Kitchen LORAZEPAM 2 MG PO TABS   Oral   Take 2 mg by mouth at bedtime. By Dr. Clent Ridges         . METFORMIN HCL 1000 MG PO TABS   Oral   Take 1,000 mg by mouth 2 (two) times daily with a meal.          . OMEPRAZOLE 20 MG PO CPDR   Oral   Take 20 mg by mouth every morning.          Marland Kitchen ONDANSETRON HCL 4 MG PO TABS   Oral   Take 1 tablet (4 mg total) by mouth every 6 (six) hours.   12 tablet   0   . OXYCODONE-ACETAMINOPHEN 5-325 MG PO TABS   Oral   Take 1 tablet by mouth every 4 (four) hours as needed for pain.   15 tablet   0   . POLYETHYLENE GLYCOL 3350 PO PACK   Oral   Take 17 g by mouth 2 (two) times daily.          Marland Kitchen PRAVASTATIN SODIUM 20 MG PO TABS   Oral   Take 20 mg by mouth every morning.          . SENNA 8.6 MG PO TABS   Oral   Take 4 tablets by mouth every morning.          Marland Kitchen SERTRALINE HCL 100 MG PO TABS   Oral   Take 100 mg by mouth at bedtime.          Marland Kitchen VITAMIN B-12 1000 MCG PO TABS   Oral   Take 1,000 mcg by mouth every morning.            BP 127/83  Pulse 100  Temp 97.4 F (36.3 C) (Oral)  Resp 18  SpO2 94%  Physical Exam  Nursing note and vitals reviewed. Constitutional: She is oriented to person, place, and time. She appears well-developed and well-nourished. No distress.  HENT:  Nose: Nose normal.  Mouth/Throat: Oropharynx is  clear and moist.  Eyes: Conjunctivae normal are normal. No scleral icterus.  Neck: Neck supple. No tracheal deviation present.  Cardiovascular: Normal rate, regular rhythm, normal heart sounds and intact distal pulses.   Pulmonary/Chest: Effort normal and breath sounds normal. No respiratory distress.  Abdominal: Soft. Normal appearance and bowel sounds are normal. She exhibits no distension and no mass. There is tenderness. There is no rebound and no guarding.       Epigastric tenderness, no  rebound or guarding.   Genitourinary:       No cva tenderness.   Musculoskeletal: She exhibits no edema and no tenderness.  Neurological: She is alert and oriented to person, place, and time.  Skin: Skin is warm and dry. No rash noted.  Psychiatric: She has a normal mood and affect.    ED Course  Procedures (including critical care time)  Results for orders placed during the hospital encounter of 12/31/11  CBC      Component Value Range   WBC 10.2  4.0 - 10.5 K/uL   RBC 5.07  3.87 - 5.11 MIL/uL   Hemoglobin 15.3 (*) 12.0 - 15.0 g/dL   HCT 78.2  95.6 - 21.3 %   MCV 86.8  78.0 - 100.0 fL   MCH 30.2  26.0 - 34.0 pg   MCHC 34.8  30.0 - 36.0 g/dL   RDW 08.6  57.8 - 46.9 %   Platelets 195  150 - 400 K/uL  COMPREHENSIVE METABOLIC PANEL      Component Value Range   Sodium 135  135 - 145 mEq/L   Potassium 3.7  3.5 - 5.1 mEq/L   Chloride 96  96 - 112 mEq/L   CO2 26  19 - 32 mEq/L   Glucose, Bld 239 (*) 70 - 99 mg/dL   BUN 9  6 - 23 mg/dL   Creatinine, Ser 6.29 (*) 0.50 - 1.10 mg/dL   Calcium 9.5  8.4 - 52.8 mg/dL   Total Protein 7.3  6.0 - 8.3 g/dL   Albumin 3.8  3.5 - 5.2 g/dL   AST 24  0 - 37 U/L   ALT 26  0 - 35 U/L   Alkaline Phosphatase 60  39 - 117 U/L   Total Bilirubin 0.3  0.3 - 1.2 mg/dL   GFR calc non Af Amer >90  >90 mL/min   GFR calc Af Amer >90  >90 mL/min  LIPASE, BLOOD      Component Value Range   Lipase 209 (*) 11 - 59 U/L  URINALYSIS, ROUTINE W REFLEX MICROSCOPIC      Component Value Range   Color, Urine YELLOW  YELLOW   APPearance CLEAR  CLEAR   Specific Gravity, Urine 1.009  1.005 - 1.030   pH 6.0  5.0 - 8.0   Glucose, UA 100 (*) NEGATIVE mg/dL   Hgb urine dipstick NEGATIVE  NEGATIVE   Bilirubin Urine NEGATIVE  NEGATIVE   Ketones, ur NEGATIVE  NEGATIVE mg/dL   Protein, ur NEGATIVE  NEGATIVE mg/dL   Urobilinogen, UA 0.2  0.0 - 1.0 mg/dL   Nitrite NEGATIVE  NEGATIVE   Leukocytes, UA NEGATIVE  NEGATIVE   US Abdomen Complete  12/31/2011  *RADIOLOGY  REPORT*  Clinical Data:  Pain.  Evaluate for gallbladder disease.  ABDOMINAL ULTRASOUND COMPLETE  Comparison:  12/29/2010  Findings:  Gallbladder:  Multiple comet-tail artifacts originate from the ventral wall the gallbladder consistent with adenomyomatosis. Question tiny stone within the gallbladder fundus.  No gallbladder wall  thickening.  Negative sonographic Murphy's sign.  Common Bile Duct:  Within normal limits in caliber.  Liver: Mild fatty infiltration of the liver.  No focal liver abnormalities. Small amount of perihepatic ascites.  IVC:  Appears normal.  Pancreas:  No abnormality identified.  Spleen:  Within normal limits in size and echotexture.  Right kidney:  Normal in size and parenchymal echogenicity.  No evidence of mass or hydronephrosis.  Left kidney:  Normal in size and parenchymal echogenicity.  No evidence of mass or hydronephrosis.  Abdominal Aorta:  No aneurysm identified.  IMPRESSION:  1.  No evidence for acute gallbladder disease. 2.  Suspect gallbladder wall adenomyomatosis.   Original Report Authenticated By: Signa Kell, M.D.    Ct Abdomen Pelvis W Contrast  12/29/2011  *RADIOLOGY REPORT*  Clinical Data: Abdominal pain and nausea for 4 days.  History breast cancer with right-sided lumpectomy in 2009.  Hysterectomy.  CT ABDOMEN AND PELVIS WITH CONTRAST  Technique:  Multidetector CT imaging of the abdomen and pelvis was performed following the standard protocol during bolus administration of intravenous contrast.  Contrast: OMNIPAQUE IOHEXOL 300 MG/ML  SOLN  Comparison: Acute abdomen series earlier today.  A CT of 10/22/2009 from [facility] is reviewed.  Findings: Suspect mild centrilobular emphysema with areas of subpleural scarring in both lung bases.  Mild cardiomegaly, without pericardial or pleural effusion.  The distal esophagus appears thick-walled, mild, on image 10.  Mild hepatic steatosis and hepatomegaly, with the liver measuring 18.2 cm cranial caudal. No focal  liver lesion.  Normal spleen.  The proximal stomach is underdistended.  There is edema centered about the pancreatic head/uncinate process and descending duodenum. Example image 34.  No well-defined peripancreatic fluid collection.  No pancreatic ductal dilatation.  Normal appearance of the gallbladder, without biliary ductal dilatation or evidence of common duct stone.  Prominent nodes in the porta hepatis are likely related to hepatic steatosis.  Normal adrenal glands.  Tiny bilateral renal lesions which are likely cysts.  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  A circumaortic left renal vein.  Age advanced aortic atherosclerosis. No retroperitoneal or retrocrural adenopathy.  Fluid-filled colon, suggesting a diarrheal state. Normal terminal ileum.  Appendix is not visualized but there is no evidence of right lower quadrant inflammation.  Normal small bowel without abdominal ascites.  No pelvic adenopathy.  Suspect mild pelvic floor laxity. Hysterectomy.  Normal urinary bladder. No adnexal mass or significant free fluid. Mild osteopenia.  IMPRESSION: 1.  Edema centered about the pancreatic head/uncinate process and descending duodenum.  Favored to represent uncomplicated mild pancreatitis.  Duodenitis either primary or secondary, could look similar.  Correlate with pancreatic enzymes. 2.  Mild hepatic steatosis and hepatomegaly.  Prominent porta hepatis nodes are likely reactive and secondary. 3.  Question distal esophagitis. 4.  Fluid filled colon, suggesting a diarrheal state.   Original Report Authenticated By: Jeronimo Greaves, M.D.    Dg Abd Acute W/chest  12/29/2011  *RADIOLOGY REPORT*  Clinical Data: 51 year old female upper abdominal pain, constipation.  ACUTE ABDOMEN SERIES (ABDOMEN 2 VIEW & CHEST 1 VIEW)  Comparison: Lumbar radiographs 12/13/2011 and earlier.  Findings: Stable lung volumes.  Cardiac size and mediastinal contours are within normal limits.  Visualized tracheal air column is within  normal limits.  No pneumothorax or pneumoperitoneum.  No acute pulmonary opacity.  Air-fluid levels throughout the abdomen with most appearing to be within colon.  Distal gas in the rectum.  No dilated small bowel loops.  Abdominal and  pelvic visceral contours are within normal limits.  Pelvic phleboliths.  Osteopenia. No acute osseous abnormality identified.  IMPRESSION: 1. Nonobstructed bowel gas pattern, no free air.  Air-fluid levels in the abdomen mostly appear to be within colon such as due to diarrhea or enema use. 2. No acute cardiopulmonary abnormality.   Original Report Authenticated By: Erskine Speed, M.D.        MDM  Iv ns bolus. protonix iv. Dilaudid iv. zofran iv.  Labs.  Reviewed nursing notes and prior charts for additional history.   Recent ed visit w pancreatitis, ct done then.     Date: 12/31/2011  Rate: 106  Rhythm: sinus tachycardia  QRS Axis: normal  Intervals: normal  ST/T Wave abnormalities: nonspecific ST/T changes  Conduction Disutrbances:none  Narrative Interpretation:   Old EKG Reviewed: none available  Recheck pain persists. Dilaudid iv. zofran iv. No change to abd exam, persistent epigastric tenderness.   hospitalist service called to admit.         Suzi Roots, MD 12/31/11 1328

## 2011-12-31 NOTE — Plan of Care (Signed)
Problem: Phase I Progression Outcomes Goal: Voiding-avoid urinary catheter unless indicated Outcome: Completed/Met Date Met:  12/31/11 Pt. Able to ambulate to restroom

## 2011-12-31 NOTE — ED Notes (Signed)
Pt states that she was here on Friday with pancreatitis and "they wanted to keep me overnight but I went home".  Pt states that she has been in constant abdominal pain 10/10 since then.  Denies NVD.

## 2011-12-31 NOTE — H&P (Signed)
History and Physical  Emily [name] ZOX:[id number] DOB: Aug 16, 1960 DOA: 12/31/2011  Referring physician: Cathren Laine, MD PCP: [name], MD   Chief Complaint: abdominal pain  HPI:  51 year old woman presented to ED for epigastric abdominal pain, nausea. Referred for admission for acute pancreatitis.  Symptoms began approximately 2 weeks ago with epigastric pain and back pain. Was diagnosed with constipation by PCP and put on bowel regimen. Failed to improve and was seen in ED 11/1, lipase >1000 and CT confirmed pancreatitis. However, patient felt well enough to go home and stayed on clear liquids. Despite clear liquid diet, epigastric pain has persisted and worsened, unrelieved by oral narcotics and patient presented to ED. Last bowel movement 11/1. No first degree family history of Gi disease. Pain 10/10 intensity.  In ED noted to be tachycardic, but vitals otherwise stable. Lipase down to 209, urinalysis negative, EKG--SR, lateral t-wave inversion. Abdominal ultrasound unremarkable. Admitted for pain control, nausea, hydration.  Review of Systems:  Negative for fever, visual changes, sore throat, rash, new muscle aches, chest pain, SOB, dysuria, bleeding  Past Medical History  Diagnosis Date  . Breast cancer     rt breast  . Anxiety   . Depression   . Diabetes mellitus   . GERD (gastroesophageal reflux disease)   . Pancreatitis     Past Surgical History  Procedure Date  . Abdominal hysterectomy     complete  . Tonsilectomy, adenoidectomy, bilateral myringotomy and tubes   . Bone fusion rt heel     Social History:  reports that she has been smoking Cigarettes.  She has a 35 pack-year smoking history. She does not have any smokeless tobacco history on file. She reports that she does not drink alcohol or use illicit drugs.  Allergies  Allergen Reactions  . Gadolinium Shortness Of Breath     Code: SOB, Onset Date: [id number]     Family History  Problem Relation Age of  Onset  . Cancer Mother      Prior to Admission medications   Medication Sig Start Date End Date Taking? Authorizing Provider  albuterol (PROVENTIL HFA;VENTOLIN HFA) 108 (90 BASE) MCG/ACT inhaler Inhale 2 puffs into the lungs every 4 (four) hours as needed. For shortness of breath or wheezing   Yes Historical Provider, MD  alendronate (FOSAMAX) 70 MG tablet Take 70 mg by mouth every Sunday. Take with a full glass of water on an empty stomach.   Yes Historical Provider, MD  calcium-vitamin D (OSCAL WITH D) 500-200 MG-UNIT per tablet Take 1 tablet by mouth 2 (two) times daily.   Yes Historical Provider, MD  cyclobenzaprine (FLEXERIL) 10 MG tablet Take 10 mg by mouth at bedtime.   Yes Historical Provider, MD  diclofenac sodium (VOLTAREN) 1 % GEL Apply 1 application topically 4 (four) times daily as needed. Apply to painful areas on hands for arthritis   Yes Historical Provider, MD  ferrous sulfate 325 (65 FE) MG tablet Take 325 mg by mouth at bedtime. (OTC iron)   Yes Historical Provider, MD  Fluticasone-Salmeterol (ADVAIR) 100-50 MCG/DOSE AEPB Inhale 1 puff into the lungs every 12 (twelve) hours.   Yes Historical Provider, MD  gabapentin (NEURONTIN) 400 MG tablet Take 800 mg by mouth 3 (three) times daily.     Yes Historical Provider, MD  glipiZIDE (GLUCOTROL) 10 MG tablet Take 10-20 mg by mouth 2 (two) times daily before a meal. Take 2 tablets by mouth in the morning and then 1 tablet in the evening  Yes Historical Provider, MD  hydrOXYzine (ATARAX/VISTARIL) 25 MG tablet Take 25 mg by mouth every 6 (six) hours as needed. For itching   Yes Historical Provider, MD  letrozole (FEMARA) 2.5 MG tablet Take 2.5 mg by mouth every morning.    Yes Historical Provider, MD  LORazepam (ATIVAN) 2 MG tablet Take 2 mg by mouth at bedtime. By Dr. Clent Ridges   Yes Historical Provider, MD  metFORMIN (GLUCOPHAGE) 1000 MG tablet Take 1,000 mg by mouth 2 (two) times daily with a meal.    Yes Historical Provider, MD    omeprazole (PRILOSEC) 20 MG capsule Take 20 mg by mouth every morning.    Yes Historical Provider, MD  ondansetron (ZOFRAN) 4 MG tablet Take 1 tablet (4 mg total) by mouth every 6 (six) hours. 12/29/11  Yes Loren Racer, MD  oxyCODONE-acetaminophen (PERCOCET/ROXICET) 5-325 MG per tablet Take 1 tablet by mouth every 4 (four) hours as needed for pain. 12/29/11  Yes Loren Racer, MD  polyethylene glycol [facility] / GLYCOLAX) packet Take 17 g by mouth 2 (two) times daily.    Yes Historical Provider, MD  pravastatin (PRAVACHOL) 20 MG tablet Take 20 mg by mouth every morning.    Yes Historical Provider, MD  senna (SENOKOT) 8.6 MG TABS Take 4 tablets by mouth every morning.    Yes Historical Provider, MD  sertraline (ZOLOFT) 100 MG tablet Take 100 mg by mouth at bedtime.    Yes Historical Provider, MD  vitamin B-12 (CYANOCOBALAMIN) 1000 MCG tablet Take 1,000 mcg by mouth every morning.    Yes Historical Provider, MD   Physical Exam: Filed Vitals:   12/31/11 1320 12/31/11 1330 12/31/11 1345 12/31/11 1400  BP: 115/87     Pulse:  107 102 106  Temp:      TempSrc:      Resp: 13 17 10 11   SpO2: 93% 95% 97% 96%    General:  Appears calm and comfortable Eyes: PERRL, normal lids, irises  ENT: grossly normal hearing, lips & tongue Neck: no LAD, masses or thyromegaly Cardiovascular: RRR, no m/r/g. No LE edema. Respiratory: CTA bilaterally, no w/r/r. Normal respiratory effort. Abdomen: soft, mild epigastric pain Skin: no rash or induration seen on limited exam Musculoskeletal: grossly normal tone BUE/BLE Psychiatric: grossly normal mood and affect, speech fluent and appropriate Neurologic: grossly non-focal.  Labs on Admission:  Basic Metabolic Panel:  Lab 12/31/11 9604 12/29/11 1140  NA 135 131*  K 3.7 3.6  CL 96 93*  CO2 26 26  GLUCOSE 239* 260*  BUN 9 10  CREATININE 0.46* 0.48*  CALCIUM 9.5 9.1  MG -- --  PHOS -- --    Liver Function Tests:  Lab 12/31/11 0930 12/29/11 1140   AST 24 27  ALT 26 31  ALKPHOS 60 56  BILITOT 0.3 0.4  PROT 7.3 7.3  ALBUMIN 3.8 3.8    Lab 12/31/11 0930 12/29/11 1140  LIPASE 209* 1206*  AMYLASE -- --   CBC:  Lab 12/31/11 0930 12/29/11 1140  WBC 10.2 11.6*  NEUTROABS -- 8.2*  HGB 15.3* 15.6*  HCT 44.0 44.5  MCV 86.8 86.1  PLT 195 211   Radiological Exams on Admission: US Abdomen Complete  12/31/2011  *RADIOLOGY REPORT*  Clinical Data:  Pain.  Evaluate for gallbladder disease.  ABDOMINAL ULTRASOUND COMPLETE  Comparison:  12/29/2010  Findings:  Gallbladder:  Multiple comet-tail artifacts originate from the ventral wall the gallbladder consistent with adenomyomatosis. Question tiny stone within the gallbladder fundus.  No gallbladder wall  thickening.  Negative sonographic Murphy's sign.  Common Bile Duct:  Within normal limits in caliber.  Liver: Mild fatty infiltration of the liver.  No focal liver abnormalities. Small amount of perihepatic ascites.  IVC:  Appears normal.  Pancreas:  No abnormality identified.  Spleen:  Within normal limits in size and echotexture.  Right kidney:  Normal in size and parenchymal echogenicity.  No evidence of mass or hydronephrosis.  Left kidney:  Normal in size and parenchymal echogenicity.  No evidence of mass or hydronephrosis.  Abdominal Aorta:  No aneurysm identified.  IMPRESSION:  1.  No evidence for acute gallbladder disease. 2.  Suspect gallbladder wall adenomyomatosis.   Original Report Authenticated By: Signa Kell, M.D.     EKG: Independently reviewed. As above.   Principal Problem:  *Acute pancreatitis Active Problems:  DM type 2 (diabetes mellitus, type 2)   Assessment/Plan 1. Acute pancreatitis--associated with nausea, pain with oral intake and intense epigastric pain. Despite improvement in Lipase and unremarkable ultrasound admit for pain control, IVF, supportive care, bowel rest. Etiology unclear, denies alcohol use. Check lipid panel. Stop pravastatin, omeprazole.  Alendronate, metformin, Zoloft possible, but less likely. No history of trauma, no evidence of anatomic abnormality. 2. DM type 2--SSI, check HgbA1c.  Code Status: full code Family Communication: discussed with mother at bedside Disposition Plan/Anticipated LOS: 1-2 days  Time spent: 50 minutes  Brendia Sacks, MD  [facility] Team 2 Pager [phone]. If 8PM-8AM, please contact night-coverage at [email or web address], password [facility] 12/31/2011, 3:57 PM

## 2011-12-31 NOTE — ED Notes (Signed)
Pt reports abd "Seems bigger since Friday", last BM 12-29-11 but pt has been on liquid diet since diagnosed with pancreatitis.

## 2012-01-01 DIAGNOSIS — E1165 Type 2 diabetes mellitus with hyperglycemia: Secondary | ICD-10-CM | POA: Diagnosis present

## 2012-01-01 DIAGNOSIS — R109 Unspecified abdominal pain: Secondary | ICD-10-CM

## 2012-01-01 DIAGNOSIS — E1149 Type 2 diabetes mellitus with other diabetic neurological complication: Secondary | ICD-10-CM | POA: Diagnosis present

## 2012-01-01 LAB — CBC
MCH: 29.5 pg (ref 26.0–34.0)
MCV: 89.8 fL (ref 78.0–100.0)
Platelets: 169 10*3/uL (ref 150–400)
RBC: 4.13 MIL/uL (ref 3.87–5.11)
RDW: 12.9 % (ref 11.5–15.5)
WBC: 7.4 10*3/uL (ref 4.0–10.5)

## 2012-01-01 LAB — COMPREHENSIVE METABOLIC PANEL
AST: 15 U/L (ref 0–37)
Albumin: 2.9 g/dL — ABNORMAL LOW (ref 3.5–5.2)
Alkaline Phosphatase: 44 U/L (ref 39–117)
BUN: 9 mg/dL (ref 6–23)
CO2: 25 mEq/L (ref 19–32)
Chloride: 105 mEq/L (ref 96–112)
Creatinine, Ser: 0.5 mg/dL (ref 0.50–1.10)
GFR calc non Af Amer: >90 mL/min (ref >90)
Potassium: 5 mEq/L (ref 3.5–5.1)
Total Bilirubin: 0.3 mg/dL (ref 0.3–1.2)

## 2012-01-01 LAB — LIPID PANEL
Cholesterol: 123 mg/dL (ref 0–200)
Triglycerides: 125 mg/dL (ref <150)
VLDL: 25 mg/dL (ref 0–40)

## 2012-01-01 LAB — GLUCOSE, CAPILLARY
Glucose-Capillary: 108 mg/dL — ABNORMAL HIGH (ref 70–99)
Glucose-Capillary: 212 mg/dL — ABNORMAL HIGH (ref 70–99)

## 2012-01-01 LAB — HEMOGLOBIN A1C: Hgb A1c MFr Bld: 8.8 % — ABNORMAL HIGH (ref <5.7)

## 2012-01-01 MED ORDER — INSULIN ASPART 100 UNIT/ML ~~LOC~~ SOLN: 0.0000 [IU] | Freq: Three times a day (TID) | SUBCUTANEOUS | Status: DC

## 2012-01-01 MED ORDER — LORAZEPAM 2 MG/ML IJ SOLN: 2.0000 mg | Freq: Once | INTRAMUSCULAR | Status: DC

## 2012-01-01 NOTE — Progress Notes (Signed)
[facility] PROGRESS NOTE  Emily [name] BJY:[id number] DOB: Jul 15, 1960 DOA: 12/31/2011 PCP: [name], MD  Assessment/Plan: 1. Acute pancreatitis--resolving symptomatically, clinically and by lipase. Lipid panel unremarkable, CT recently did not suggest anatomic abnormality. No history of alcohol use. History highly suggestive of pravastatin as etiology (initiation coincides with onset of symptoms). Another possiblity would be omeprazole, but patient has been on for a long time. Alendronate, metformin, Zoloft also possible, but less likely. Will resume all medications except pravastatin, advance diet, likely home 11/5. 2. DM type 2, uncontrolled Hgb A1c 8.8--SSI. Resume metformin on discharge. Close outpatient follow-up.  Code Status: full code  Family Communication: none present Disposition Plan: home, likely 11/5  Brendia Sacks, MD  [facility] Team 2 Pager [phone]. If 8PM-8AM, please contact night-coverage at [email or web address], password [facility] 01/01/2012, 2:12 PM  LOS: 1 day   Brief narrative: 51 year old woman presented to ED for epigastric abdominal pain, nausea. Referred for admission for acute pancreatitis. Symptoms began approximately 2 weeks ago with epigastric pain and back pain and coincide with initiation of pravastatin.  Consultants:  none  Procedures:  none  HPI/Subjective: Feels much better, no nausea/vomiting/abdominal pain. Hungry.  Objective: Filed Vitals:   12/31/11 2112 01/01/12 0429 01/01/12 0826 01/01/12 1333  BP: 107/67 93/63  100/56  Pulse: 119 110  96  Temp: 100.2 F (37.9 C) 98.9 F (37.2 C)  98 F (36.7 C)  TempSrc: Oral Oral    Resp: 18 16  20   Height:      Weight:      SpO2: 94% 94% 92% 97%    Intake/Output Summary (Last 24 hours) at 01/01/12 1412 Last data filed at 01/01/12 1300  Gross per 24 hour  Intake 1773.33 ml  Output    200 ml  Net 1573.33 ml   Filed Weights   12/31/11 1609  Weight: 76.204 kg (168 lb)     Exam:  General:  Appears calm and comfortable Cardiovascular: RRR, no m/r/g. No LE edema. Respiratory: CTA bilaterally, no w/r/r. Normal respiratory effort. Abdomen: soft, ntnd Psychiatric: grossly normal mood and affect, speech fluent and appropriate  Data Reviewed: Basic Metabolic Panel:  Lab 01/01/12 6962 12/31/11 0930 12/29/11 1140  NA 137 135 131*  K 5.0 3.7 3.6  CL 105 96 93*  CO2 25 26 26   GLUCOSE 68* 239* 260*  BUN 9 9 10   CREATININE 0.50 0.46* 0.48*  CALCIUM 7.7* 9.5 9.1  MG -- -- --  PHOS -- -- --   Liver Function Tests:  Lab 01/01/12 0415 12/31/11 0930 12/29/11 1140  AST 15 24 27   ALT 17 26 31   ALKPHOS 44 60 56  BILITOT 0.3 0.3 0.4  PROT 5.6* 7.3 7.3  ALBUMIN 2.9* 3.8 3.8    Lab 01/01/12 0412 12/31/11 0930 12/29/11 1140  LIPASE 73* 209* 1206*  AMYLASE -- -- --   CBC:  Lab 01/01/12 0415 12/31/11 0930 12/29/11 1140  WBC 7.4 10.2 11.6*  NEUTROABS -- -- 8.2*  HGB 12.2 15.3* 15.6*  HCT 37.1 44.0 44.5  MCV 89.8 86.8 86.1  PLT 169 195 211   CBG:  Lab 01/01/12 1139 01/01/12 0901 12/31/11 2345 12/31/11 1819  GLUCAP 97 108* 84 131*   Studies: US Abdomen Complete  12/31/2011  *RADIOLOGY REPORT*  Clinical Data:  Pain.  Evaluate for gallbladder disease.  ABDOMINAL ULTRASOUND COMPLETE  Comparison:  12/29/2010  Findings:  Gallbladder:  Multiple comet-tail artifacts originate from the ventral wall the gallbladder consistent with adenomyomatosis. Question tiny stone within the  gallbladder fundus.  No gallbladder wall thickening.  Negative sonographic Murphy's sign.  Common Bile Duct:  Within normal limits in caliber.  Liver: Mild fatty infiltration of the liver.  No focal liver abnormalities. Small amount of perihepatic ascites.  IVC:  Appears normal.  Pancreas:  No abnormality identified.  Spleen:  Within normal limits in size and echotexture.  Right kidney:  Normal in size and parenchymal echogenicity.  No evidence of mass or hydronephrosis.  Left kidney:   Normal in size and parenchymal echogenicity.  No evidence of mass or hydronephrosis.  Abdominal Aorta:  No aneurysm identified.  IMPRESSION:  1.  No evidence for acute gallbladder disease. 2.  Suspect gallbladder wall adenomyomatosis.   Original Report Authenticated By: Signa Kell, M.D.     Scheduled Meds:   . [COMPLETED] sodium chloride   Intravenous STAT  . cyclobenzaprine  10 mg Oral QHS  . enoxaparin (LOVENOX) injection  40 mg Subcutaneous Q24H  . gabapentin  800 mg Oral TID  . insulin aspart  0-9 Units Subcutaneous Q6H  . letrozole  2.5 mg Oral q morning - 10a  . mometasone-formoterol  2 puff Inhalation BID  . senna  4 tablet Oral q morning - 10a  . sertraline  100 mg Oral QHS  . [DISCONTINUED] gabapentin  800 mg Oral TID  . [DISCONTINUED] glipiZIDE  10 mg Oral BID AC  . [DISCONTINUED] insulin aspart  0-9 Units Subcutaneous TID WC   Continuous Infusions:   . [DISCONTINUED] 0.9 % NaCl with KCl 20 mEq / L 1,000 mL (01/01/12 0428)    Principal Problem:  *Acute pancreatitis Active Problems:  DM (diabetes mellitus), type 2, uncontrolled     Brendia Sacks, MD  [facility] Team 2 Pager [phone]. If 8PM-8AM, please contact night-coverage at [email or web address], password [facility] 01/01/2012, 2:12 PM  LOS: 1 day   Time spent: 20 minutes

## 2012-01-01 NOTE — Progress Notes (Signed)
Pt. Tolerating clear liquids. OOB ambulating to the bathroom. Medicated with Tylenol for a headache, pt states headache is better.

## 2012-01-02 DIAGNOSIS — IMO0001 Reserved for inherently not codable concepts without codable children: Secondary | ICD-10-CM

## 2012-01-02 LAB — LIPASE, BLOOD: Lipase: 37 U/L (ref 11–59)

## 2012-01-02 LAB — GLUCOSE, CAPILLARY

## 2012-01-02 MED ORDER — LORAZEPAM 1 MG PO TABS
2.0000 mg | ORAL_TABLET | Freq: Once | ORAL | Status: AC
  Administered 2012-01-02: 2 mg via ORAL
  Filled 2012-01-02: qty 2

## 2012-01-02 NOTE — Discharge Summary (Signed)
Physician Discharge Summary  KRYSTI HICKLING QIO:[id number] DOB: [birth year] DOA: 12/31/2011  PCP: [name], MD  Admit date: 12/31/2011 Discharge date: 01/02/2012  Time spent: 25  minutes  Recommendations for Outpatient Follow-up:  Home with outpatient PCP follow up in 1 week  Discharge Diagnoses:  Principal Problem:  *Acute pancreatitis Active Problems:  DM (diabetes mellitus), type 2, uncontrolled   Discharge Condition: FAIR  Diet recommendation: diabetic  Filed Weights   12/31/11 1609  Weight: 76.204 kg (168 lb)    History of present illness:  51 year old woman presented to ED for epigastric abdominal pain, nausea. Referred for admission for acute pancreatitis. Symptoms began approximately 2 weeks ago with epigastric pain and back pain and coincide with initiation of pravastatin.   Hospital Course:   Acute pancreatitis Resolved . tolerating diet now. Lipid panel unremarkable, CT abdomen shows  Edema centered about the pancreatic head/uncinate process and  descending duodenum suggestive of uncomplicated pancreatitis. Lipase elevated to 200s and now trended down.   No history of alcohol use. USG negative for gallstones.  History highly suggestive of pravastatin as etiology as onset of symptoms coincides with initiation of the agent about 2 weeks back.  Although The risk of pravastatin causing pancreatitis is very low (<1 %)  . Another possiblity is  omeprazole, but patient has been on for a long time. Alendronate, metformin, Zoloft are also possible, but less likely given she is on all these agents for a long time as well. Will resume all medications except pravastatin.  DM type 2, uncontrolled Hgb A1c 8.8  Resume metformin on discharge. Needs close outpatient follow-up.   patient clinically stable for  discharge home with outpatient follow up.   Procedures:  NONE  Consultations:  NONE  Discharge Exam: Filed Vitals:   01/01/12 2025 01/01/12 2037 01/02/12  0500 01/02/12 0858  BP: 109/72  106/75   Pulse: 96  93   Temp: 97.7 F (36.5 C)  97.6 F (36.4 C)   TempSrc: Oral  Oral   Resp: 19 18 18    Height:      Weight:      SpO2: 94%  93% 94%    General: middle aged female in NAD HEENT: no pallor, moist oral mucosa Cardiovascular: NS1&S2, no murmurs Respiratory: clear b/l, no added sounds Abdomen: soft, NT, ND, BS+ Ext: warm, no edema CNS: AAOX3  Discharge Instructions  Discharge Orders    Future Appointments: Provider: Department: Dept Phone: Center:   04/17/2012 11:30 AM Randall An, MD [facility] [phone] None       Medication List     As of 01/02/2012 10:15 AM    STOP taking these medications         pravastatin 20 MG tablet   Commonly known as: PRAVACHOL      TAKE these medications         albuterol 108 (90 BASE) MCG/ACT inhaler   Commonly known as: PROVENTIL HFA;VENTOLIN HFA   Inhale 2 puffs into the lungs every 4 (four) hours as needed. For shortness of breath or wheezing      alendronate 70 MG tablet   Commonly known as: FOSAMAX   Take 70 mg by mouth every Sunday. Take with a full glass of water on an empty stomach.      calcium-vitamin D 500-200 MG-UNIT per tablet   Commonly known as: OSCAL WITH D   Take 1 tablet by mouth 2 (two) times daily.      cyclobenzaprine 10 MG  tablet   Commonly known as: FLEXERIL   Take 10 mg by mouth at bedtime.      ferrous sulfate 325 (65 FE) MG tablet   Take 325 mg by mouth at bedtime. (OTC iron)      Fluticasone-Salmeterol 100-50 MCG/DOSE Aepb   Commonly known as: ADVAIR   Inhale 1 puff into the lungs every 12 (twelve) hours.      gabapentin 400 MG tablet   Commonly known as: NEURONTIN   Take 800 mg by mouth 3 (three) times daily.      glipiZIDE 10 MG tablet   Commonly known as: GLUCOTROL   Take 10-20 mg by mouth 2 (two) times daily before a meal. Take 2 tablets by mouth in the morning and then 1 tablet in the evening      hydrOXYzine 25 MG  tablet   Commonly known as: ATARAX/VISTARIL   Take 25 mg by mouth every 6 (six) hours as needed. For itching      letrozole 2.5 MG tablet   Commonly known as: FEMARA   Take 2.5 mg by mouth every morning.      LORazepam 2 MG tablet   Commonly known as: ATIVAN   Take 2 mg by mouth at bedtime. By Dr. Clent Ridges      metFORMIN 1000 MG tablet   Commonly known as: GLUCOPHAGE   Take 1,000 mg by mouth 2 (two) times daily with a meal.      omeprazole 20 MG capsule   Commonly known as: PRILOSEC   Take 20 mg by mouth every morning.      ondansetron 4 MG tablet   Commonly known as: ZOFRAN   Take 1 tablet (4 mg total) by mouth every 6 (six) hours.      oxyCODONE-acetaminophen 5-325 MG per tablet   Commonly known as: PERCOCET/ROXICET   Take 1 tablet by mouth every 4 (four) hours as needed for pain.      polyethylene glycol packet   Commonly known as: MIRALAX / GLYCOLAX   Take 17 g by mouth 2 (two) times daily.      senna 8.6 MG Tabs   Commonly known as: SENOKOT   Take 4 tablets by mouth every morning.      sertraline 100 MG tablet   Commonly known as: ZOLOFT   Take 100 mg by mouth at bedtime.      vitamin B-12 1000 MCG tablet   Commonly known as: CYANOCOBALAMIN   Take 1,000 mcg by mouth every morning.      VOLTAREN 1 % Gel   Generic drug: diclofenac sodium   Apply 1 application topically 4 (four) times daily as needed. Apply to painful areas on hands for arthritis           Follow-up Information    Follow up with [facility], [place]. In 1 week.   Contact information:   [address] [place] [address] [phone]           The results of significant diagnostics from this hospitalization (including imaging, microbiology, ancillary and laboratory) are listed below for reference.    Significant Diagnostic Studies: US Abdomen Complete  12/31/2011  *RADIOLOGY REPORT*  Clinical Data:  Pain.  Evaluate for gallbladder disease.  ABDOMINAL ULTRASOUND  COMPLETE  Comparison:  12/29/2010  Findings:  Gallbladder:  Multiple comet-tail artifacts originate from the ventral wall the gallbladder consistent with adenomyomatosis. Question tiny stone within the gallbladder fundus.  No gallbladder wall thickening.  Negative sonographic  Murphy's sign.  Common Bile Duct:  Within normal limits in caliber.  Liver: Mild fatty infiltration of the liver.  No focal liver abnormalities. Small amount of perihepatic ascites.  IVC:  Appears normal.  Pancreas:  No abnormality identified.  Spleen:  Within normal limits in size and echotexture.  Right kidney:  Normal in size and parenchymal echogenicity.  No evidence of mass or hydronephrosis.  Left kidney:  Normal in size and parenchymal echogenicity.  No evidence of mass or hydronephrosis.  Abdominal Aorta:  No aneurysm identified.  IMPRESSION:  1.  No evidence for acute gallbladder disease. 2.  Suspect gallbladder wall adenomyomatosis.   Original Report Authenticated By: Signa Kell, M.D.    Ct Abdomen Pelvis W Contrast  12/29/2011  *RADIOLOGY REPORT*  Clinical Data: Abdominal pain and nausea for 4 days.  History breast cancer with right-sided lumpectomy in 2009.  Hysterectomy.  CT ABDOMEN AND PELVIS WITH CONTRAST  Technique:  Multidetector CT imaging of the abdomen and pelvis was performed following the standard protocol during bolus administration of intravenous contrast.  Contrast: OMNIPAQUE IOHEXOL 300 MG/ML  SOLN  Comparison: Acute abdomen series earlier today.  A CT of 10/22/2009 from [facility] is reviewed.  Findings: Suspect mild centrilobular emphysema with areas of subpleural scarring in both lung bases.  Mild cardiomegaly, without pericardial or pleural effusion.  The distal esophagus appears thick-walled, mild, on image 10.  Mild hepatic steatosis and hepatomegaly, with the liver measuring 18.2 cm cranial caudal. No focal liver lesion.  Normal spleen.  The proximal stomach is underdistended.  There is edema  centered about the pancreatic head/uncinate process and descending duodenum. Example image 34.  No well-defined peripancreatic fluid collection.  No pancreatic ductal dilatation.  Normal appearance of the gallbladder, without biliary ductal dilatation or evidence of common duct stone.  Prominent nodes in the porta hepatis are likely related to hepatic steatosis.  Normal adrenal glands.  Tiny bilateral renal lesions which are likely cysts.  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  A circumaortic left renal vein.  Age advanced aortic atherosclerosis. No retroperitoneal or retrocrural adenopathy.  Fluid-filled colon, suggesting a diarrheal state. Normal terminal ileum.  Appendix is not visualized but there is no evidence of right lower quadrant inflammation.  Normal small bowel without abdominal ascites.  No pelvic adenopathy.  Suspect mild pelvic floor laxity. Hysterectomy.  Normal urinary bladder. No adnexal mass or significant free fluid. Mild osteopenia.  IMPRESSION: 1.  Edema centered about the pancreatic head/uncinate process and descending duodenum.  Favored to represent uncomplicated mild pancreatitis.  Duodenitis either primary or secondary, could look similar.  Correlate with pancreatic enzymes. 2.  Mild hepatic steatosis and hepatomegaly.  Prominent porta hepatis nodes are likely reactive and secondary. 3.  Question distal esophagitis. 4.  Fluid filled colon, suggesting a diarrheal state.   Original Report Authenticated By: Jeronimo Greaves, M.D.    Dg Abd Acute W/chest  12/29/2011  *RADIOLOGY REPORT*  Clinical Data: 51 year old female upper abdominal pain, constipation.  ACUTE ABDOMEN SERIES (ABDOMEN 2 VIEW & CHEST 1 VIEW)  Comparison: Lumbar radiographs 12/13/2011 and earlier.  Findings: Stable lung volumes.  Cardiac size and mediastinal contours are within normal limits.  Visualized tracheal air column is within normal limits.  No pneumothorax or pneumoperitoneum.  No acute pulmonary opacity.   Air-fluid levels throughout the abdomen with most appearing to be within colon.  Distal gas in the rectum.  No dilated small bowel loops.  Abdominal and pelvic visceral contours are  within normal limits.  Pelvic phleboliths.  Osteopenia. No acute osseous abnormality identified.  IMPRESSION: 1. Nonobstructed bowel gas pattern, no free air.  Air-fluid levels in the abdomen mostly appear to be within colon such as due to diarrhea or enema use. 2. No acute cardiopulmonary abnormality.   Original Report Authenticated By: Erskine Speed, M.D.     Microbiology: No results found for this or any previous visit (from the past 240 hour(s)).   Labs: Basic Metabolic Panel:  Lab 01/01/12 5621 12/31/11 0930 12/29/11 1140  NA 137 135 131*  K 5.0 3.7 3.6  CL 105 96 93*  CO2 25 26 26   GLUCOSE 68* 239* 260*  BUN 9 9 10   CREATININE 0.50 0.46* 0.48*  CALCIUM 7.7* 9.5 9.1  MG -- -- --  PHOS -- -- --   Liver Function Tests:  Lab 01/01/12 0415 12/31/11 0930 12/29/11 1140  AST 15 24 27   ALT 17 26 31   ALKPHOS 44 60 56  BILITOT 0.3 0.3 0.4  PROT 5.6* 7.3 7.3  ALBUMIN 2.9* 3.8 3.8    Lab 01/02/12 0427 01/01/12 0412 12/31/11 0930 12/29/11 1140  LIPASE 37 73* 209* 1206*  AMYLASE -- -- -- --   No results found for this basename: AMMONIA:5 in the last 168 hours CBC:  Lab 01/01/12 0415 12/31/11 0930 12/29/11 1140  WBC 7.4 10.2 11.6*  NEUTROABS -- -- 8.2*  HGB 12.2 15.3* 15.6*  HCT 37.1 44.0 44.5  MCV 89.8 86.8 86.1  PLT 169 195 211   Cardiac Enzymes: No results found for this basename: CKTOTAL:5,CKMB:5,CKMBINDEX:5,TROPONINI:5 in the last 168 hours BNP: BNP (last 3 results) No results found for this basename: PROBNP:3 in the last 8760 hours CBG:  Lab 01/02/12 0746 01/01/12 2305 01/01/12 1801 01/01/12 1139 01/01/12 0901  GLUCAP 139* 93 212* 97 108*       Signed:  Tieasha Larsen  [facility] 01/02/2012, 10:15 AM

## 2012-01-10 LAB — COMPLETE METABOLIC PANEL WITH GFR
ALP, Heat Stable (Liver): 48
Anion gap: 14.1
CO2: 28 mmol/L
Calcium: 9.7 mg/dL
Chloride: 102 mmol/L
GFR, Est Non African American: 95
Glucose: 114
Potassium: 3.9 mmol/L

## 2012-01-10 LAB — CBC
Hematocrit: 42.4 % (ref 34.8–46)
Hemoglobin: 14.4 g/dL (ref 11.8–15.5)
Lipase: 36 units/L (ref 0–53)
MCV: 88.6 fL (ref 78–100)
Platelets: 194 10*3/uL
RBC: 4.79
WBC: 8.7

## 2012-02-14 ENCOUNTER — Encounter (HOSPITAL_COMMUNITY): Payer: Self-pay | Admitting: Pharmacy Technician

## 2012-03-05 ENCOUNTER — Encounter (HOSPITAL_COMMUNITY): Payer: Self-pay | Admitting: *Deleted

## 2012-03-05 NOTE — Pre-Procedure Instructions (Addendum)
Your procedure is scheduled on:Wednesday, March 06, 2012 Report to [facility] Admitting [id number] Call this number if you have problems morning of your procedure:[phone]  Follow all bowel prep instructions per your doctor's orders.  Do not eat or drink anything after midnight the night before your procedure. You may brush your teeth, rinse out your mouth, but no water, no food, no chewing gum, no mints, no candies, no chewing tobacco.     Take these medicines the morning of your procedure with A SIP OF WATER:Neurontin and Prilosec and use Albuterol   Please make arrangements for a responsible person to drive you home after the procedure. You cannot go home by cab/taxi. We recommend you have someone with you at home the first 24 hours after your procedure. Driver for procedure is Glennon Hamilton and Harlon Ditty [phone]  LEAVE ALL VALUABLES, JEWELRY, BILLFOLD AT HOME.  NO DENTURES, CONTACT LENSES ALLOWED IN THE ENDOSCOPY ROOM.   YOU MAY WEAR DEODORANT, PLEASE REMOVE ALL JEWELRY, WATCHES RINGS, BODY PIERCINGS AND LEAVE AT HOME.   WOMEN: NO MAKE-UP, LOTIONS PERFUMES

## 2012-03-06 ENCOUNTER — Encounter (HOSPITAL_COMMUNITY): Payer: Self-pay | Admitting: *Deleted

## 2012-03-06 ENCOUNTER — Encounter (HOSPITAL_COMMUNITY): Payer: Self-pay | Admitting: Registered Nurse

## 2012-03-06 ENCOUNTER — Ambulatory Visit (HOSPITAL_COMMUNITY): Payer: Medicare Other | Admitting: Registered Nurse

## 2012-03-06 ENCOUNTER — Ambulatory Visit (HOSPITAL_COMMUNITY)
Admission: RE | Admit: 2012-03-06 | Discharge: 2012-03-06 | Disposition: A | Discharge status: Home / Self Care | Payer: Medicare Other | Source: Ambulatory Visit | Attending: Gastroenterology | Admitting: Gastroenterology

## 2012-03-06 ENCOUNTER — Encounter (HOSPITAL_COMMUNITY)
Admission: RE | Disposition: A | Discharge status: Home / Self Care | Payer: Self-pay | Source: Ambulatory Visit | Attending: Gastroenterology

## 2012-03-06 DIAGNOSIS — K859 Acute pancreatitis without necrosis or infection, unspecified: Secondary | ICD-10-CM | POA: Insufficient documentation

## 2012-03-06 HISTORY — PX: EUS: SHX5427

## 2012-03-06 SURGERY — ESOPHAGEAL ENDOSCOPIC ULTRASOUND (EUS) RADIAL
Anesthesia: Monitor Anesthesia Care

## 2012-03-06 MED ORDER — PROMETHAZINE HCL 25 MG/ML IJ SOLN: 6.2500 mg | INTRAMUSCULAR | Status: DC | PRN

## 2012-03-06 MED ORDER — ALBUTEROL SULFATE (5 MG/ML) 0.5% IN NEBU: 2.5000 mg | INHALATION_SOLUTION | Freq: Four times a day (QID) | RESPIRATORY_TRACT | Status: DC | PRN

## 2012-03-06 MED ORDER — ALBUTEROL SULFATE (5 MG/ML) 0.5% IN NEBU
INHALATION_SOLUTION | RESPIRATORY_TRACT | Status: AC
  Filled 2012-03-06: qty 0.5

## 2012-03-06 MED ORDER — FENTANYL CITRATE 0.05 MG/ML IJ SOLN
INTRAMUSCULAR | Status: DC | PRN
Start: 2012-03-06 — End: 2012-03-06
  Administered 2012-03-06: 100 ug via INTRAVENOUS

## 2012-03-06 MED ORDER — SODIUM CHLORIDE 0.9 % IV SOLN: INTRAVENOUS | Status: DC

## 2012-03-06 MED ORDER — PROPOFOL INFUSION 10 MG/ML OPTIME
INTRAVENOUS | Status: DC | PRN
  Administered 2012-03-06: 140 ug/kg/min via INTRAVENOUS

## 2012-03-06 MED ORDER — MIDAZOLAM HCL 5 MG/5ML IJ SOLN
INTRAMUSCULAR | Status: DC | PRN
  Administered 2012-03-06: 2 mg via INTRAVENOUS

## 2012-03-06 MED ORDER — LIDOCAINE VISCOUS 2 % MT SOLN
OROMUCOSAL | Status: DC | PRN
  Administered 2012-03-06: 20 mL via OROMUCOSAL

## 2012-03-06 MED ORDER — LIDOCAINE VISCOUS 2 % MT SOLN
OROMUCOSAL | Status: AC
  Filled 2012-03-06: qty 15

## 2012-03-06 MED ORDER — LACTATED RINGERS IV SOLN
INTRAVENOUS | Status: DC
  Administered 2012-03-06: 1000 mL via INTRAVENOUS

## 2012-03-06 MED ORDER — LACTATED RINGERS IV SOLN
INTRAVENOUS | Status: DC | PRN
  Administered 2012-03-06: 08:00:00 via INTRAVENOUS

## 2012-03-06 MED ORDER — LIDOCAINE HCL (CARDIAC) 20 MG/ML IV SOLN
INTRAVENOUS | Status: DC | PRN
  Administered 2012-03-06: 30 mg via INTRAVENOUS

## 2012-03-06 NOTE — Anesthesia Preprocedure Evaluation (Addendum)
Anesthesia Evaluation  Patient identified by MRN, date of birth, ID band Patient awake    Reviewed: Allergy & Precautions, H&P , NPO status , Patient's Chart, lab work & pertinent test results  Airway Mallampati: II TM Distance: >3 FB Neck ROM: Full    Dental  (+) Teeth Intact and Dental Advisory Given   Pulmonary Current Smoker,    + decreased breath sounds      Cardiovascular negative cardio ROS  Rhythm:Regular Rate:Normal     Neuro/Psych Anxiety Depression negative neurological ROS     GI/Hepatic GERD-  Medicated,Pancreatitis   Endo/Other  diabetes, Type 2  Renal/GU negative Renal ROS  negative genitourinary   Musculoskeletal negative musculoskeletal ROS (+)   Abdominal   Peds  Hematology negative hematology ROS (+)   Anesthesia Other Findings   Reproductive/Obstetrics negative OB ROS                          Anesthesia Physical Anesthesia Plan  ASA: III  Anesthesia Plan: MAC   Post-op Pain Management:    Induction:   Airway Management Planned: Simple Face Mask  Additional Equipment:   Intra-op Plan:   Post-operative Plan:   Informed Consent: I have reviewed the patients History and Physical, chart, labs and discussed the procedure including the risks, benefits and alternatives for the proposed anesthesia with the patient or authorized representative who has indicated his/her understanding and acceptance.   Dental advisory given  Plan Discussed with: CRNA  Anesthesia Plan Comments:         Anesthesia Quick Evaluation

## 2012-03-06 NOTE — Addendum Note (Signed)
Addendum  created 03/06/12 1352 by Illene Silver, CRNA   Modules edited:Anesthesia Medication Administration

## 2012-03-06 NOTE — Addendum Note (Signed)
Addendum  created 03/06/12 1352 by Gini Caputo E Safiya Girdler, CRNA   Modules edited:Anesthesia Medication Administration    

## 2012-03-06 NOTE — Transfer of Care (Signed)
Immediate Anesthesia Transfer of Care Note  Patient: Emily [name]  Procedure(s) Performed: Procedure(s) (LRB) with comments: ESOPHAGEAL ENDOSCOPIC ULTRASOUND (EUS) RADIAL (N/A)  Patient Location: PACU  Anesthesia Type:MAC  Level of Consciousness: awake, alert , oriented and patient cooperative  Airway & Oxygen Therapy: Patient Spontanous Breathing and Patient connected to face mask oxygen  Post-op Assessment: Report given to PACU RN, Post -op Vital signs reviewed and stable and Patient moving all extremities X 4  Post vital signs: stable  Complications: No apparent anesthesia complications

## 2012-03-06 NOTE — Op Note (Signed)
[facility] [address] [place], [address]   ENDOSCOPIC ULTRASOUND PROCEDURE REPORT  PATIENT: Emily, [name].  MR#: [id number] BIRTHDATE: August 22, 1960  GENDER: Female ENDOSCOPIST: Willis Modena, MD REFERRED BY:  [name], M.D. PROCEDURE DATE:  03/06/2012 PROCEDURE:   Upper EUS ASA CLASS:      Class III INDICATIONS:   1.  idiopathic pancreatitis. MEDICATIONS: Cetacaine spray x 2 and MAC sedation, administered by CRNA  DESCRIPTION OF PROCEDURE:   After the risks benefits and alternatives of the procedure were  explained, informed consent was obtained. The patient was then placed in the left, lateral, decubitus postion and IV sedation was administered. Throughout the procedure, the patients blood pressure, pulse and oxygen saturations were monitored continuously.  Under direct visualization, the Pentax Radial EUS [id number]  endoscope was introduced through the mouth  and advanced to the second portion of the duodenum      .  Water was used as necessary to provide an acoustic interface.  Upon completion of the imaging, water was removed and the patient was sent to the recovery room in satisfactory condition.     FINDINGS:      Scattered hyperechoic strands/foci especially in head/uncinate; this, in conjunction with triangular benign-appearing periportal and peripancreatic lymph nodes, is overall most consistent with prior pancreatitis.  No mass or other abnormality seen anywhere else in pancreas.  Mild symmetrical bile duct wall thickening, likely reactive, with no evidence of mass or bile duct stone.  Gallbladder with mild wall thickening, likely reactive; calcified intramural sub-cm cyst within gallbladder wall; no obvious stones or sludge.  Ampulla appears normal.  Endoscopic appearance of distal esophagus normal.  IMPRESSION:     1.  As above.  Reactive changes from prior pancreatitis. 2.  No obvious source of pancreatitis seen; possibly  still gallbladder.  RECOMMENDATIONS:     1.  Watch for potential complications of procedure. 2.  Manage expectantly for now; consider cholecystectomy when/if patient has another attack of pancreatitis, though patient is certainly above average. 3.  Follow with [facility] in 3-4 months.   _______________________________ Rosalie DoctorWillis Modena, MD 03/06/2012 9:19 AM   CC:

## 2012-03-06 NOTE — H&P (Signed)
Patient interval history reviewed.  Patient examined again.  There has been no change from documented H/P dated 03/05/12 (scanned into chart from our office) except as documented above.  Assessment:  1.  Distal esophageal thickening, suspect esophagitis. 2.  Idiopathic pancreatitis.  No alcohol.  Plan:  1.  Endoscopy with endoscopic ultrasound. 2.  Risks (bleeding, infection, bowel perforation that could require surgery, sedation-related changes in cardiopulmonary systems), benefits (identification and possible treatment of source of symptoms, exclusion of certain causes of symptoms), and alternatives (watchful waiting, radiographic imaging studies, empiric medical treatment) of upper endoscopy with ultrasound (EGD) were explained to patient in detail and she wishes to proceed.

## 2012-03-06 NOTE — Anesthesia Postprocedure Evaluation (Signed)
Anesthesia Post Note  Patient: Emily [name]  Procedure(s) Performed: Procedure(s) (LRB): ESOPHAGEAL ENDOSCOPIC ULTRASOUND (EUS) RADIAL (N/A)  Anesthesia type: MAC  Patient location: PACU  Post pain: Pain level controlled  Post assessment: Post-op Vital signs reviewed  Last Vitals:  Filed Vitals:   03/06/12 0935  BP: 104/71  Pulse:   Temp:   Resp: 17    Post vital signs: Reviewed  Level of consciousness: sedated  Complications: No apparent anesthesia complications

## 2012-03-07 ENCOUNTER — Encounter (HOSPITAL_COMMUNITY): Payer: Self-pay | Admitting: Gastroenterology

## 2012-03-13 ENCOUNTER — Other Ambulatory Visit (HOSPITAL_COMMUNITY): Payer: Self-pay | Admitting: Internal Medicine

## 2012-03-13 DIAGNOSIS — J449 Chronic obstructive pulmonary disease, unspecified: Secondary | ICD-10-CM

## 2012-03-13 LAB — HEMOGLOBIN A1C: Hgb A1c MFr Bld: 7.9 % — AB (ref 4.0–6.0)

## 2012-03-18 ENCOUNTER — Ambulatory Visit (HOSPITAL_COMMUNITY)
Admission: RE | Admit: 2012-03-18 | Discharge: 2012-03-18 | Disposition: A | Discharge status: Home / Self Care | Payer: Medicare Other | Source: Ambulatory Visit | Attending: Internal Medicine | Admitting: Internal Medicine

## 2012-03-18 DIAGNOSIS — J449 Chronic obstructive pulmonary disease, unspecified: Secondary | ICD-10-CM | POA: Insufficient documentation

## 2012-03-18 DIAGNOSIS — J4489 Other specified chronic obstructive pulmonary disease: Secondary | ICD-10-CM | POA: Insufficient documentation

## 2012-03-18 LAB — PULMONARY FUNCTION TEST

## 2012-03-18 MED ORDER — ALBUTEROL SULFATE (5 MG/ML) 0.5% IN NEBU: 2.5000 mg | INHALATION_SOLUTION | Freq: Once | RESPIRATORY_TRACT | Status: DC

## 2012-03-18 MED ORDER — ALBUTEROL SULFATE (5 MG/ML) 0.5% IN NEBU
2.5000 mg | INHALATION_SOLUTION | Freq: Once | RESPIRATORY_TRACT | Status: AC
  Administered 2012-03-18: 2.5 mg via RESPIRATORY_TRACT

## 2012-04-13 ENCOUNTER — Other Ambulatory Visit: Payer: Self-pay

## 2012-04-17 ENCOUNTER — Encounter (HOSPITAL_COMMUNITY): Payer: Medicare Other | Attending: Oncology | Admitting: Oncology

## 2012-04-17 ENCOUNTER — Encounter (HOSPITAL_COMMUNITY): Payer: Self-pay | Admitting: Oncology

## 2012-04-17 VITALS — BP 134/73 | HR 104 | Temp 97.2°F | Resp 18 | Wt 166.0 lb

## 2012-04-17 DIAGNOSIS — J449 Chronic obstructive pulmonary disease, unspecified: Secondary | ICD-10-CM

## 2012-04-17 DIAGNOSIS — G90529 Complex regional pain syndrome I of unspecified lower limb: Secondary | ICD-10-CM

## 2012-04-17 DIAGNOSIS — C50919 Malignant neoplasm of unspecified site of unspecified female breast: Secondary | ICD-10-CM

## 2012-04-17 DIAGNOSIS — Z170 Estrogen receptor positive status [ER+]: Secondary | ICD-10-CM

## 2012-04-17 DIAGNOSIS — C50419 Malignant neoplasm of upper-outer quadrant of unspecified female breast: Secondary | ICD-10-CM

## 2012-04-17 NOTE — Progress Notes (Signed)
Emily Showers, MD [address] [place] [address] [place] [address]  ADENOCARCINOMA, BREAST  CURRENT THERAPY:On letrozole 2.5 mg daily, started Tamoxifen on 03/13/2008.  She will finish hormonal therapy on 03/13/2013  INTERVAL HISTORY: Emily [name] 52 y.o. female returns for  regular  visit for followup of Stage IIIa invasive ductal carcinoma the right breast intermediate grade 3.1 cm with 5 of 7 positive nodes with extracapsular extension ER +55% PR +31% HER-2/neu not amplified Ki-67 marker 14% status post a.c. x4 cycles dose dense fashion followed by weekly Taxol x12 followed by radiation therapy. She was started on tamoxifen 03/13/2008 was unable to tolerate tamoxifen and also could not tolerate Arimidex so she is now on letrozole 2.5 mg once a day and she will continue that for total 5 years and started tamoxifen on 03/13/2008. Her chemotherapy ended as of 12/13/2007. Her date of surgery was 05/27/2007.   Unfortunately, Emily [name] had an episode of pancreatitis in November 2013.  This required a hospital admission.  She has fully recovered from that and denies any abdominal pain.  She does not drink EtOH.   Emily [name] reports that a few weeks ago, she got a "bear-hug" from one of her son's friends.  Since then, her 6-7 rib has been painful.  She reports that it is tender when she lays down, takes deep breaths, and on palpation. She does not take NSAIDs for the discomfort due to "gastritis."  I have offered the patient an Xray of her ribs, but I provided her education that she likely has a bruised rib or intercostal muscle strain. She was educated that there is no treatment for a broken rib other than supportive care.  She will continue with supportive care at this time.    She continues to smoke 1 ppd and I recommended that she quit and smoking cessation education provided.   She is tolerating the Letrozole well.  She denies any arthralgias, myalgias, and hot flashes that interfere with her  ADLs.  She will take this for 5 years and complete this in 03/13/2013.  Oncologically, she denies any complaints.  Oncologic ROS questioning is negative.    Past Medical History  Diagnosis Date  . Anxiety   . Diabetes mellitus   . GERD (gastroesophageal reflux disease)   . Pancreatitis   . Breast cancer     rt breast  . Depression     has ADENOCARCINOMA, BREAST; DM; CONSTIPATION; Acute pancreatitis; DM type 2 (diabetes mellitus, type 2); and DM (diabetes mellitus), type 2, uncontrolled on her problem list.     is allergic to gadolinium.  Emily [name] had no medications administered during this visit.  Past Surgical History  Procedure Laterality Date  . Abdominal hysterectomy      complete  . Tonsilectomy, adenoidectomy, bilateral myringotomy and tubes    . Bone fusion rt heel    . Tonsillectomy    . Breast lumpectomy  2009    Right breast  . Eus  03/06/2012    Procedure: ESOPHAGEAL ENDOSCOPIC ULTRASOUND (EUS) RADIAL;  Surgeon: Willis Modena, MD;  Location: [facility];  Service: Endoscopy;  Laterality: N/A;    Denies any headaches, dizziness, double vision, fevers, chills, night sweats, nausea, vomiting, diarrhea, constipation, chest pain, heart palpitations, shortness of breath, blood in stool, black tarry stool, urinary pain, urinary burning, urinary frequency, hematuria.   PHYSICAL EXAMINATION  ECOG PERFORMANCE STATUS: 1 - Symptomatic but completely ambulatory  Filed Vitals:   04/17/12 1121  BP: 134/73  Pulse:  104  Temp: 97.2 F (36.2 C)  Resp: 18    GENERAL:alert, no distress, well nourished, well developed, comfortable, cooperative, obese, smiling and chronically-ill appearing SKIN: skin color, texture, turgor are normal, no rashes or significant lesions HEAD: Normocephalic, No masses, lesions, tenderness or abnormalities EYES: normal, Conjunctiva are pink and non-injected EARS: External ears normal OROPHARYNX:mucous membranes are moist  NECK: supple, no  adenopathy, thyroid normal size, non-tender, without nodularity, no stridor, non-tender, trachea midline LYMPH:  no palpable lymphadenopathy, no hepatosplenomegaly CHEST: Left 6th rib tenderness on palpation lateral to breast and at infra-mammary fold. No erythema, heat noted.  No edema noted.  BREAST:right breast normal without mass, skin or nipple changes or axillary nodes, left post-lumpectomy site well healed and free of suspicious changes.  Radiation tattoos noted. LUNGS: clear to auscultation and percussion, decreased breath sounds HEART: regular rate & rhythm, no murmurs, no gallops, S1 normal and S2 normal ABDOMEN:abdomen soft, non-tender, obese, normal bowel sounds, no masses or organomegaly and no hepatosplenomegaly BACK: Back symmetric, no curvature., No CVA tenderness EXTREMITIES:less then 2 second capillary refill, no joint deformities, effusion, or inflammation, no edema, no skin discoloration, no clubbing, no cyanosis  NEURO: alert & oriented x 3 with fluent speech, no focal motor/sensory deficits, gait normal   LABORATORY DATA: CBC    Component Value Date/Time   WBC 7.4 01/01/2012 0415   WBC 5.6 02/04/2008 1413   RBC 4.13 01/01/2012 0415   RBC 4.40 02/04/2008 1413   HGB 12.2 01/01/2012 0415   HGB 13.3 02/04/2008 1413   HCT 37.1 01/01/2012 0415   HCT 39.4 02/04/2008 1413   PLT 169 01/01/2012 0415   PLT 185 02/04/2008 1413   MCV 89.8 01/01/2012 0415   MCV 89.6 02/04/2008 1413   MCH 29.5 01/01/2012 0415   MCH 30.2 02/04/2008 1413   MCHC 32.9 01/01/2012 0415   MCHC 33.7 02/04/2008 1413   RDW 12.9 01/01/2012 0415   RDW 15.9* 02/04/2008 1413   LYMPHSABS 2.7 12/29/2011 1140   LYMPHSABS 1.1 02/04/2008 1413   MONOABS 0.6 12/29/2011 1140   MONOABS 0.2 02/04/2008 1413   EOSABS 0.2 12/29/2011 1140   EOSABS 0.1 02/04/2008 1413   BASOSABS 0.0 12/29/2011 1140   BASOSABS 0.0 02/04/2008 1413      Chemistry      Component Value Date/Time   NA 137 01/01/2012 0415   K 5.0 01/01/2012 0415   CL 105  01/01/2012 0415   CO2 25 01/01/2012 0415   BUN 9 01/01/2012 0415   CREATININE 0.50 01/01/2012 0415      Component Value Date/Time   CALCIUM 7.7* 01/01/2012 0415   ALKPHOS 44 01/01/2012 0415   AST 15 01/01/2012 0415   ALT 17 01/01/2012 0415   BILITOT 0.3 01/01/2012 0415       ASSESSMENT:  1. Stage IIIa invasive ductal carcinoma the right breast intermediate grade 3.1 cm with 5 of 7 positive nodes with extracapsular extension ER +55% PR +31% HER-2/neu not amplified Ki-67 marker 14% status post a.c. x4 cycles dose dense fashion followed by weekly Taxol x12 followed by radiation therapy. She was started on tamoxifen 03/13/2008 was unable to tolerate tamoxifen and also could not tolerate Arimidex so she is now on letrozole 2.5 mg once a day and she will continue that for total 5 years and started tamoxifen on 03/13/2008. Her chemotherapy ended as of 12/13/2007. Her date of surgery was 05/27/2007.  2.  RSD with chronic right leg pain  3.  COPD still smoking a  pack a day they were CAT scan in March did not show tumor  4. Peripheral vascular disease with decreased pulses in her feet  5. BRCA1 or BRCA2 negativity though she does have a family history of breast cancer. She also status post hysterectomy and oophorectomy by Dr. Ralph Dowdy in August 2011  6. Abnormal liver enzymes for which she was referred to a physician at [facility] who determined that at some point in the distant past she did have hepatitis but that her transaminases have resolved most recently. He fell she had a history of hepatitis E   PLAN:  1. I personally reviewed and went over laboratory results with the patient. 2. I personally reviewed and went over radiographic studies with the patient. 3. Continue Letrozole 2.5 mg daily for 5 years (ending on 03/13/2013). 4. Lab work in 6 months: CBC diff, CMET 5. Tylenol, ice, and heat for left rib contusion 6. Patient declined a left rib x-ray at this time.  7. Mammogram is due this March  8. Return  in 6 months for follow-up.   All questions were answered. The patient knows to call the clinic with any problems, questions or concerns. We can certainly see the patient much sooner if necessary.  The patient and plan discussed with Glenford Peers, MD and he is in agreement with the aforementioned.  Emily [name]

## 2012-04-17 NOTE — Patient Instructions (Addendum)
.  [facility] Discharge Instructions  RECOMMENDATIONS MADE BY THE CONSULTANT AND ANY TEST RESULTS WILL BE SENT TO YOUR REFERRING PHYSICIAN.  EXAM FINDINGS BY THE PHYSICIAN TODAY AND SIGNS OR SYMPTOMS TO REPORT TO CLINIC OR PRIMARY PHYSICIAN: Exam per Jenita Seashore PA Your discomfort is probably from a bruised rib. If it does not get better call us and we will get an xray.  MEDICATIONS PRESCRIBED:  You can take tylenol for pain     SPECIAL INSTRUCTIONS/FOLLOW-UP: Labs in 6 months and see [place] back in 6 months  Thank you for choosing [facility] to provide your oncology and hematology care.  To afford each patient quality time with our providers, please arrive at least 15 minutes before your scheduled appointment time.  With your help, our goal is to use those 15 minutes to complete the necessary work-up to ensure our physicians have the information they need to help with your evaluation and healthcare recommendations.    Effective January 1st, 2014, we ask that you re-schedule your appointment with our physicians should you arrive 10 or more minutes late for your appointment.  We strive to give you quality time with our providers, and arriving late affects you and other patients whose appointments are after yours.    Again, thank you for choosing [facility].  Our hope is that these requests will decrease the amount of time that you wait before being seen by our physicians.       _____________________________________________________________  Should you have questions after your visit to [facility], please contact our office at [phone] between the hours of 8:30 a.m. and 5:00 p.m.  Voicemails left after 4:30 p.m. will not be returned until the following business day.  For prescription refill requests, have your pharmacy contact our office with your prescription refill request.

## 2012-04-22 ENCOUNTER — Other Ambulatory Visit (HOSPITAL_COMMUNITY): Payer: Self-pay | Admitting: Oncology

## 2012-04-22 DIAGNOSIS — C50911 Malignant neoplasm of unspecified site of right female breast: Secondary | ICD-10-CM

## 2012-05-13 ENCOUNTER — Other Ambulatory Visit (HOSPITAL_COMMUNITY): Payer: Self-pay | Admitting: Oncology

## 2012-05-29 ENCOUNTER — Encounter (HOSPITAL_COMMUNITY): Payer: Medicare Other

## 2012-05-29 ENCOUNTER — Inpatient Hospital Stay (HOSPITAL_COMMUNITY): Admission: RE | Admit: 2012-05-29 | Payer: Medicare Other | Source: Ambulatory Visit

## 2012-06-19 ENCOUNTER — Ambulatory Visit (HOSPITAL_COMMUNITY)
Admission: RE | Admit: 2012-06-19 | Discharge: 2012-06-19 | Disposition: A | Discharge status: Home / Self Care | Payer: Medicare Other | Source: Ambulatory Visit | Attending: Oncology | Admitting: Oncology

## 2012-06-19 ENCOUNTER — Other Ambulatory Visit (HOSPITAL_COMMUNITY): Payer: Self-pay | Admitting: Oncology

## 2012-06-19 DIAGNOSIS — Z853 Personal history of malignant neoplasm of breast: Secondary | ICD-10-CM | POA: Insufficient documentation

## 2012-06-19 DIAGNOSIS — R921 Mammographic calcification found on diagnostic imaging of breast: Secondary | ICD-10-CM

## 2012-06-19 DIAGNOSIS — C50911 Malignant neoplasm of unspecified site of right female breast: Secondary | ICD-10-CM

## 2012-06-21 LAB — COMPLETE METABOLIC PANEL WITH GFR
ALT: 37 U/L — AB (ref 7–35)
Albumin: 4.5
Anion gap: 14.2
BUN: 15 mg/dL (ref 4–21)
CO2: 27 mmol/L
Chol/HDL Ratio, serum: 5.9
Creat: 0.68
Direct LDL: 150
Glucose: 185
Non HDL Chol. (LDL+VLDL): 155
Total Bilirubin: 0.7 mg/dL
Total HDL-C Direct: 31
Total Protein: 7.3 g/dL
Triglycerides: 93

## 2012-06-21 LAB — CBC
BASO%: 3 %
EOS%: 4 %
HCT: 40 %
HGB: 13.3 g/dL
Hgb A1c MFr Bld: 7.8 % — AB (ref 4.0–6.0)
MCH: 29.2
MCHC: 33.3
MCV: 87.9 fL (ref 78–100)
MONO#: 2.9
MONO%: 33 %
NEUT%: 60 %
Platelets: 213 10*3/uL
lymph#: 5.3
nRBC#: 0

## 2012-06-24 ENCOUNTER — Ambulatory Visit
Admission: RE | Admit: 2012-06-24 | Discharge: 2012-06-24 | Disposition: A | Discharge status: Home / Self Care | Payer: Medicare Other | Source: Ambulatory Visit | Attending: Oncology | Admitting: Oncology

## 2012-06-24 DIAGNOSIS — R921 Mammographic calcification found on diagnostic imaging of breast: Secondary | ICD-10-CM

## 2012-07-08 ENCOUNTER — Other Ambulatory Visit (HOSPITAL_COMMUNITY): Payer: Self-pay | Admitting: Oncology

## 2012-08-06 ENCOUNTER — Emergency Department (INDEPENDENT_AMBULATORY_CARE_PROVIDER_SITE_OTHER): Payer: Medicare Other

## 2012-08-06 ENCOUNTER — Emergency Department (INDEPENDENT_AMBULATORY_CARE_PROVIDER_SITE_OTHER)
Admission: EM | Admit: 2012-08-06 | Discharge: 2012-08-06 | Disposition: A | Discharge status: Home / Self Care | Payer: Medicare Other | Source: Home / Self Care | Attending: Emergency Medicine | Admitting: Emergency Medicine

## 2012-08-06 ENCOUNTER — Encounter (HOSPITAL_COMMUNITY): Payer: Self-pay | Admitting: Emergency Medicine

## 2012-08-06 ENCOUNTER — Other Ambulatory Visit: Payer: Self-pay

## 2012-08-06 DIAGNOSIS — R05 Cough: Secondary | ICD-10-CM

## 2012-08-06 DIAGNOSIS — R059 Cough, unspecified: Secondary | ICD-10-CM

## 2012-08-06 DIAGNOSIS — J40 Bronchitis, not specified as acute or chronic: Secondary | ICD-10-CM

## 2012-08-06 LAB — POCT I-STAT, CHEM 8
Calcium, Ion: 1.16 mmol/L (ref 1.12–1.23)
Chloride: 104 mEq/L (ref 96–112)
Glucose, Bld: 175 mg/dL — ABNORMAL HIGH (ref 70–99)
HCT: 47 % — ABNORMAL HIGH (ref 36.0–46.0)
TCO2: 26 mmol/L (ref 0–100)

## 2012-08-06 MED ORDER — BENZONATATE 200 MG PO CAPS: 200.0000 mg | ORAL_CAPSULE | Freq: Three times a day (TID) | ORAL | Status: DC | PRN

## 2012-08-06 MED ORDER — ALBUTEROL SULFATE (5 MG/ML) 0.5% IN NEBU
INHALATION_SOLUTION | RESPIRATORY_TRACT | Status: AC
  Filled 2012-08-06: qty 1

## 2012-08-06 MED ORDER — IPRATROPIUM-ALBUTEROL 0.5-2.5 (3) MG/3ML IN SOLN
3.0000 mL | Freq: Once | RESPIRATORY_TRACT | Status: AC
  Administered 2012-08-06: 3 mL via RESPIRATORY_TRACT

## 2012-08-06 MED ORDER — BUDESONIDE-FORMOTEROL FUMARATE 80-4.5 MCG/ACT IN AERO: 2.0000 | INHALATION_SPRAY | Freq: Two times a day (BID) | RESPIRATORY_TRACT | Status: DC

## 2012-08-06 NOTE — ED Provider Notes (Signed)
History     CSN: [id number]  Arrival date & time 08/06/12  1458   None     Chief Complaint  Patient presents with  . Shortness of Breath    (Consider location/radiation/quality/duration/timing/severity/associated sxs/prior treatment) HPI Comments: Patient presents complaining of cough, wheezing, and shortness of breath for one week. The shortness of breath is only while she is coughing, not other times. She called her primary care physician about this and called and an inhaler, a Z-Pak, and prednisone for her to take. She was to do this and follow up but she has not been able to get an appointment. She took the Z-Pak and has been using the inhaler, but did not take the prednisone because she has diabetes. She has shortness of breath is worse with any activity and was coughing. She is also experiencing some diaphoresis. She denies chest pain, nausea, vomiting, or shortness of breath at rest. This patient does smoke cigarettes and has a recent history of breast cancer, diagnosed in 2009. She states her diabetes is not under control because she does not eat right.    Patient is a 52 y.o. female presenting with shortness of breath.  Shortness of Breath Associated symptoms: diaphoresis and wheezing   Associated symptoms: no abdominal pain, no chest pain, no cough, no fever, no rash and no vomiting     Past Medical History  Diagnosis Date  . Anxiety   . Diabetes mellitus   . GERD (gastroesophageal reflux disease)   . Pancreatitis   . Breast cancer     rt breast  . Depression     Past Surgical History  Procedure Laterality Date  . Abdominal hysterectomy      complete  . Tonsilectomy, adenoidectomy, bilateral myringotomy and tubes    . Bone fusion rt heel    . Tonsillectomy    . Breast lumpectomy  2009    Right breast  . Eus  03/06/2012    Procedure: ESOPHAGEAL ENDOSCOPIC ULTRASOUND (EUS) RADIAL;  Surgeon: Willis Modena, MD;  Location: [facility];  Service: Endoscopy;   Laterality: N/A;    Family History  Problem Relation Age of Onset  . Cancer Mother     History  Substance Use Topics  . Smoking status: Current Every Day Smoker -- 1.00 packs/day for 35 years    Types: Cigarettes  . Smokeless tobacco: Current User  . Alcohol Use: No    OB History   Grav Para Term Preterm Abortions TAB SAB Ect Mult Living                  Review of Systems  Constitutional: Positive for diaphoresis and fatigue. Negative for fever and chills.  Eyes: Negative for visual disturbance.  Respiratory: Positive for shortness of breath and wheezing. Negative for cough and chest tightness.   Cardiovascular: Negative for chest pain, palpitations and leg swelling.  Gastrointestinal: Negative for nausea, vomiting and abdominal pain.  Endocrine: Negative for polydipsia and polyuria.  Genitourinary: Negative for dysuria, urgency and frequency.  Musculoskeletal: Negative for myalgias and arthralgias.  Skin: Negative for rash.  Neurological: Negative for dizziness, weakness and light-headedness.    Allergies  Gadolinium  Home Medications   Current Outpatient Rx  Name  Route  Sig  Dispense  Refill  . albuterol (PROVENTIL HFA;VENTOLIN HFA) 108 (90 BASE) MCG/ACT inhaler   Inhalation   Inhale 2 puffs into the lungs every 4 (four) hours as needed. For shortness of breath or wheezing         .  alendronate (FOSAMAX) 70 MG tablet      TAKE ONE TABLET BY MOUTH ONCE A WEEK   4 tablet   0   . benzonatate (TESSALON) 200 MG capsule   Oral   Take 1 capsule (200 mg total) by mouth 3 (three) times daily as needed for cough.   20 capsule   0   . budesonide-formoterol (SYMBICORT) 80-4.5 MCG/ACT inhaler   Inhalation   Inhale 2 puffs into the lungs 2 (two) times daily.   1 Inhaler   12   . calcium-vitamin D (OSCAL WITH D) 500-200 MG-UNIT per tablet   Oral   Take 1 tablet by mouth 2 (two) times daily.         . Ciclopirox 1 % shampoo   Topical   Apply topically.           . diclofenac sodium (VOLTAREN) 1 % GEL   Topical   Apply 1 application topically 4 (four) times daily as needed. Apply to painful areas on hands for arthritis         . ferrous sulfate 325 (65 FE) MG tablet   Oral   Take 325 mg by mouth at bedtime. (OTC iron)         . gabapentin (NEURONTIN) 400 MG tablet   Oral   Take 800 mg by mouth 3 (three) times daily.           Marland Kitchen gemfibrozil (LOPID) 600 MG tablet   Oral   Take 600 mg by mouth 2 (two) times daily.          Marland Kitchen glipiZIDE (GLUCOTROL) 10 MG tablet   Oral   Take 10-20 mg by mouth 2 (two) times daily before a meal. Take 2 tablets by mouth in the morning and then 1 tablet in the evening         . hydrOXYzine (ATARAX/VISTARIL) 25 MG tablet   Oral   Take 25 mg by mouth at bedtime. For itching         . letrozole (FEMARA) 2.5 MG tablet   Oral   Take 2.5 mg by mouth every morning.          Marland Kitchen LORazepam (ATIVAN) 2 MG tablet   Oral   Take 2 mg by mouth at bedtime. By Dr. Clent Ridges         . metFORMIN (GLUCOPHAGE) 1000 MG tablet   Oral   Take 1,000 mg by mouth 2 (two) times daily with a meal.          . omeprazole (PRILOSEC) 20 MG capsule   Oral   Take 20 mg by mouth every morning.          . polycarbophil (FIBERCON) 625 MG tablet   Oral   Take 625 mg by mouth daily.         . Probiotic Product (ALIGN) 4 MG CAPS   Oral   Take 1 capsule by mouth daily.         . sertraline (ZOLOFT) 100 MG tablet   Oral   Take 100 mg by mouth at bedtime.          . vitamin B-12 (CYANOCOBALAMIN) 1000 MCG tablet   Oral   Take 1,000 mcg by mouth every morning.            BP 125/88  Pulse 105  Temp(Src) 98.1 F (36.7 C) (Oral)  Resp 18  SpO2 98%  Physical Exam  Nursing note and vitals reviewed. Constitutional: She  is oriented to person, place, and time. Vital signs are normal. She appears well-developed and well-nourished. No distress.  HENT:  Head: Atraumatic.  Eyes: EOM are normal. Pupils are  equal, round, and reactive to light.  Cardiovascular: Normal rate, regular rhythm and normal heart sounds.  Exam reveals no gallop and no friction rub.   No murmur heard. Pulmonary/Chest: Effort normal and breath sounds normal. No respiratory distress. She has no wheezes. She has no rales.  Abdominal: Soft. There is no tenderness.  Neurological: She is alert and oriented to person, place, and time. She has normal strength.  Skin: Skin is warm and dry. She is not diaphoretic.  Psychiatric: She has a normal mood and affect. Her behavior is normal. Judgment normal.    ED Course  Procedures (including critical care time)  Labs Reviewed  POCT I-STAT, CHEM 8 - Abnormal; Notable for the following:    Glucose, Bld 175 (*)    Hemoglobin 16.0 (*)    HCT 47.0 (*)    All other components within normal limits   Dg Chest 2 View  08/06/2012   *RADIOLOGY REPORT*  Clinical Data: Shortness of breath.  Reported 35 pack years history of smoking  CHEST - 2 VIEW  Comparison: Acute abdominal series 12/29/2011, CT chest 05/15/2011, she is chest radiograph 11/29/2011  Findings: Normal heart, mediastinal, hilar contours.  Lung volumes are normal.  There is diffuse prominence of the interstitial lung markings, most prominent at the lung bases.  This interstitial prominence appears similar to prior chest CT.  No focal airspace disease is identified.  No acute osseous abnormality.  IMPRESSION: Chronic interstitial prominence, as described on prior chest CTs. In this patient with a long history of smoking, this may be secondary to chronic respiratory bronchiolitis secondary to smoking.   Original Report Authenticated By: Britta Mccreedy, M.D.     1. Cough   2. Bronchitis       MDM  This patient's EKG is normal and her chest x-ray does not reveal any acute abnormalities. Will treat her cough see how she does.  If she does not improve she will return or go to the ER.    Pt urged to f/u with her PCP regarding blood  sugar control    Meds ordered this encounter  Medications  . ipratropium-albuterol (DUONEB) 0.5-2.5 (3) MG/3ML nebulizer solution 3 mL    Sig:   . budesonide-formoterol (SYMBICORT) 80-4.5 MCG/ACT inhaler    Sig: Inhale 2 puffs into the lungs 2 (two) times daily.    Dispense:  1 Inhaler    Refill:  12  . benzonatate (TESSALON) 200 MG capsule    Sig: Take 1 capsule (200 mg total) by mouth 3 (three) times daily as needed for cough.    Dispense:  20 capsule    Refill:  0           Graylon Good, PA-C 08/06/12 1710

## 2012-08-06 NOTE — ED Notes (Signed)
Pt c/o SOB x 1 week. Went to PCP and was told she has bronchitis. Was given Z pak and prednisone and inhaler. Finished Z pak on Friday, did not take prednisone due to her diabetes. Last used inhaler this morning with mild relief. Feels SOB on exertion. Patient is alert and oriented.

## 2012-08-06 NOTE — ED Notes (Signed)
Total abdominal hysterectomy in 2012, per pt. Mw,cma.

## 2012-08-06 NOTE — ED Provider Notes (Signed)
Medical screening examination/treatment/procedure(s) were performed by non-physician practitioner and as supervising physician I was immediately available for consultation/collaboration.  Schylar Allard   Corynn Solberg, MD 08/06/12 1938 

## 2012-08-21 ENCOUNTER — Other Ambulatory Visit (HOSPITAL_COMMUNITY): Payer: Self-pay | Admitting: Oncology

## 2012-09-06 ENCOUNTER — Encounter (HOSPITAL_COMMUNITY): Payer: Self-pay | Admitting: Emergency Medicine

## 2012-09-06 ENCOUNTER — Emergency Department (HOSPITAL_COMMUNITY)
Admission: EM | Admit: 2012-09-06 | Discharge: 2012-09-06 | Disposition: A | Discharge status: Home / Self Care | Payer: Medicare Other | Attending: Emergency Medicine | Admitting: Emergency Medicine

## 2012-09-06 ENCOUNTER — Emergency Department (HOSPITAL_COMMUNITY): Payer: Medicare Other

## 2012-09-06 DIAGNOSIS — R109 Unspecified abdominal pain: Secondary | ICD-10-CM

## 2012-09-06 DIAGNOSIS — M545 Low back pain, unspecified: Secondary | ICD-10-CM

## 2012-09-06 DIAGNOSIS — Z9889 Other specified postprocedural states: Secondary | ICD-10-CM | POA: Insufficient documentation

## 2012-09-06 DIAGNOSIS — F3289 Other specified depressive episodes: Secondary | ICD-10-CM | POA: Insufficient documentation

## 2012-09-06 DIAGNOSIS — F411 Generalized anxiety disorder: Secondary | ICD-10-CM | POA: Insufficient documentation

## 2012-09-06 DIAGNOSIS — E119 Type 2 diabetes mellitus without complications: Secondary | ICD-10-CM | POA: Insufficient documentation

## 2012-09-06 DIAGNOSIS — R110 Nausea: Secondary | ICD-10-CM | POA: Insufficient documentation

## 2012-09-06 DIAGNOSIS — IMO0002 Reserved for concepts with insufficient information to code with codable children: Secondary | ICD-10-CM | POA: Insufficient documentation

## 2012-09-06 DIAGNOSIS — F329 Major depressive disorder, single episode, unspecified: Secondary | ICD-10-CM | POA: Insufficient documentation

## 2012-09-06 DIAGNOSIS — W010XXA Fall on same level from slipping, tripping and stumbling without subsequent striking against object, initial encounter: Secondary | ICD-10-CM | POA: Insufficient documentation

## 2012-09-06 DIAGNOSIS — Y9389 Activity, other specified: Secondary | ICD-10-CM | POA: Insufficient documentation

## 2012-09-06 DIAGNOSIS — K219 Gastro-esophageal reflux disease without esophagitis: Secondary | ICD-10-CM | POA: Insufficient documentation

## 2012-09-06 DIAGNOSIS — F17200 Nicotine dependence, unspecified, uncomplicated: Secondary | ICD-10-CM | POA: Insufficient documentation

## 2012-09-06 DIAGNOSIS — Z8719 Personal history of other diseases of the digestive system: Secondary | ICD-10-CM | POA: Insufficient documentation

## 2012-09-06 DIAGNOSIS — Z853 Personal history of malignant neoplasm of breast: Secondary | ICD-10-CM | POA: Insufficient documentation

## 2012-09-06 DIAGNOSIS — Y929 Unspecified place or not applicable: Secondary | ICD-10-CM | POA: Insufficient documentation

## 2012-09-06 DIAGNOSIS — Z79899 Other long term (current) drug therapy: Secondary | ICD-10-CM | POA: Insufficient documentation

## 2012-09-06 LAB — LIPASE, BLOOD: Lipase: 31 U/L (ref 11–59)

## 2012-09-06 MED ORDER — DIAZEPAM 5 MG PO TABS: 5.0000 mg | ORAL_TABLET | Freq: Three times a day (TID) | ORAL | Status: DC | PRN

## 2012-09-06 MED ORDER — IBUPROFEN 600 MG PO TABS: 600.0000 mg | ORAL_TABLET | Freq: Four times a day (QID) | ORAL | Status: DC | PRN

## 2012-09-06 NOTE — ED Provider Notes (Signed)
Medical screening examination/treatment/procedure(s) were performed by non-physician practitioner and as supervising physician I was immediately available for consultation/collaboration.   Gavin Pound. Yailine Ballard, MD 09/06/12 1118

## 2012-09-06 NOTE — ED Notes (Signed)
Pt states she fell on Sunday and has been having back pain radiating through abdomen.  Went to PCP on Tuesday.  PT states pain has been worse since then.  Denies n/v/d.

## 2012-09-06 NOTE — ED Provider Notes (Signed)
History    CSN: [id number] Arrival date & time 09/06/12  0841  First MD Initiated Contact with Patient 09/06/12 0857     Chief Complaint  Patient presents with  . Back Pain   (Consider location/radiation/quality/duration/timing/severity/associated sxs/prior Treatment) HPI Pt is a 52yo female with hx of pancreatitis c/o back pain that started suddenly on Sunday after tripping over wooden decking on her back porch, falling on right side of body. Denies hitting head or LOC.  Since then pain has been aching and throbbing going up and down her back, started to radiate around to abdomen.  Pt states abdominal pain feels similar to last time she had pancreatitis.  Pt was seen by PCP on Tuesday who gave her diclofenac patch for pain but no relief.  Pt reports chronically "bad" LFTs so she is hesitant to take NSAIDs.  Pt states pain is constant, worse with walking, brought her to tears last night while trying to sleep. Pt does report bruise to right lateral thigh but pain is worse in lower back and around abdomen.  Denies knowing cause of previous pancreatitis, denies use of ETOH.  Past Medical History  Diagnosis Date  . Anxiety   . Diabetes mellitus   . GERD (gastroesophageal reflux disease)   . Pancreatitis   . Breast cancer     rt breast  . Depression    Past Surgical History  Procedure Laterality Date  . Abdominal hysterectomy      complete  . Tonsilectomy, adenoidectomy, bilateral myringotomy and tubes    . Bone fusion rt heel    . Tonsillectomy    . Breast lumpectomy  2009    Right breast  . Eus  03/06/2012    Procedure: ESOPHAGEAL ENDOSCOPIC ULTRASOUND (EUS) RADIAL;  Surgeon: Willis Modena, MD;  Location: [facility];  Service: Endoscopy;  Laterality: N/A;   Family History  Problem Relation Age of Onset  . Cancer Mother    History  Substance Use Topics  . Smoking status: Current Every Day Smoker -- 1.00 packs/day for 35 years    Types: Cigarettes  . Smokeless tobacco:  Current User  . Alcohol Use: No   OB History   Grav Para Term Preterm Abortions TAB SAB Ect Mult Living                 Review of Systems  Constitutional: Negative for fever and chills.  Gastrointestinal: Positive for nausea ( mild) and abdominal pain ( upper). Negative for vomiting, diarrhea and constipation.  All other systems reviewed and are negative.    Allergies  Gadolinium  Home Medications   Current Outpatient Rx  Name  Route  Sig  Dispense  Refill  . albuterol (PROVENTIL HFA;VENTOLIN HFA) 108 (90 BASE) MCG/ACT inhaler   Inhalation   Inhale 2 puffs into the lungs every 4 (four) hours as needed. For shortness of breath or wheezing         . alendronate (FOSAMAX) 70 MG tablet      TAKE ONE TABLET BY MOUTH ONCE A WEEK   4 tablet   0   . calcium-vitamin D (OSCAL WITH D) 500-200 MG-UNIT per tablet   Oral   Take 1 tablet by mouth 2 (two) times daily.         . celecoxib (CELEBREX) 200 MG capsule   Oral   Take 200 mg by mouth daily.         . diclofenac (FLECTOR) 1.3 % PTCH   Transdermal  Place 1 patch onto the skin daily.         . diclofenac sodium (VOLTAREN) 1 % GEL   Topical   Apply 1 application topically 4 (four) times daily as needed. Apply to painful areas on hands for arthritis         . ferrous sulfate 325 (65 FE) MG tablet   Oral   Take 325 mg by mouth at bedtime. (OTC iron)         . gabapentin (NEURONTIN) 400 MG tablet   Oral   Take 800 mg by mouth 3 (three) times daily.           Marland Kitchen gemfibrozil (LOPID) 600 MG tablet   Oral   Take 600 mg by mouth 2 (two) times daily.          Marland Kitchen glipiZIDE (GLUCOTROL) 10 MG tablet   Oral   Take 10-20 mg by mouth 2 (two) times daily before a meal. Take 2 tablets by mouth in the morning and then 1 tablet in the evening         . letrozole (FEMARA) 2.5 MG tablet   Oral   Take 2.5 mg by mouth every morning.          Marland Kitchen LORazepam (ATIVAN) 2 MG tablet   Oral   Take 2 mg by mouth at  bedtime. By Dr. Clent Ridges         . metFORMIN (GLUCOPHAGE) 1000 MG tablet   Oral   Take 1,000 mg by mouth 2 (two) times daily with a meal.          . omeprazole (PRILOSEC) 20 MG capsule   Oral   Take 20 mg by mouth every morning.          . sertraline (ZOLOFT) 100 MG tablet   Oral   Take 100 mg by mouth at bedtime.          . diazepam (VALIUM) 5 MG tablet   Oral   Take 1 tablet (5 mg total) by mouth every 8 (eight) hours as needed (take 1-2 pills every 8 hours as needed for back pain.).   10 tablet   0   . hydrOXYzine (ATARAX/VISTARIL) 25 MG tablet   Oral   Take 25 mg by mouth at bedtime. For itching         . ibuprofen (ADVIL,MOTRIN) 600 MG tablet   Oral   Take 1 tablet (600 mg total) by mouth every 6 (six) hours as needed for pain.   30 tablet   0    BP 131/90  Pulse 102  Temp(Src) 98.3 F (36.8 C) (Oral)  Resp 18  Ht 5\' 5"  (1.651 m)  Wt 165 lb (74.844 kg)  BMI 27.46 kg/m2  SpO2 95% Physical Exam  Nursing note and vitals reviewed. Constitutional: She appears well-developed and well-nourished. No distress.  HENT:  Head: Normocephalic and atraumatic.  Eyes: Conjunctivae are normal. No scleral icterus.  Neck: Normal range of motion. Neck supple.  Cardiovascular: Normal rate, regular rhythm and normal heart sounds.   Pulmonary/Chest: Effort normal and breath sounds normal. No respiratory distress. She has no wheezes. She has no rales. She exhibits no tenderness.  Abdominal: Soft. Bowel sounds are normal. She exhibits no distension and no mass. There is tenderness ( mild, diffuse). There is no rebound and no guarding.  Musculoskeletal: Normal range of motion. She exhibits tenderness. She exhibits no edema.       Back:  Legs: TTP along lower thoracic and lumbar paraspinal muscles.  No midline, spinal tenderness, step offs or crepitus.  TTP in right lateral thigh/buttock with 4-6cm area of ecchymosis. No TTP over pelvic bone including ASIS or greater  trochanter   Neurological: She is alert.  Skin: Skin is warm and dry. No rash noted. She is not diaphoretic.    ED Course  Procedures (including critical care time) Labs Reviewed  LIPASE, BLOOD   Dg Lumbar Spine Complete  09/06/2012   *RADIOLOGY REPORT*  Clinical Data: Larey Seat on 09/01/2012, mid low back pain  LUMBAR SPINE - COMPLETE 4+ VIEW  Comparison: CT abdomen pelvis of 12/29/2011  Findings: The lumbar vertebrae are in normal alignment. Intervertebral disc spaces appear normal.  No compression deformity is seen.  On oblique views no significant degenerative change is noted.  The SI joints appear corticated and the bowel gas pattern is nonspecific.  IMPRESSION: Normal alignment.  Normal intervertebral disc spaces.  No acute abnormality.   Original Report Authenticated By: Dwyane Dee, M.D.   1. LBP (low back pain)   2. Abdominal pain     MDM  Will get lipase and imaging of lumbar spine due to pt's hx of pancreatitis and c/o pain similar to previous pancreatitis.  Pt able to eat, only mildy nauseous, on exam abd soft, non-distended, mild tenderness throughout.  11:10 AM Pt declined pain medication at this time, stating she drove herself and would like to hold off if possible.    Lipase: 31, WNL Plain films Lumbar Spine: nl alignment, normal intervertebral disc space, no acute abnormality   Tx: valium and ibuprofen. Will discharge pt home and have her f/u with PCP, [name]. Return precautions given. Pt verbalized understanding and agreement with tx plan. Vitals: unremarkable. Discharged in stable condition.    Discussed pt with attending during ED encounter.   Junius Finner, PA-C 09/06/12 1110

## 2012-09-26 ENCOUNTER — Encounter (HOSPITAL_COMMUNITY): Payer: Self-pay | Admitting: *Deleted

## 2012-09-26 ENCOUNTER — Emergency Department (HOSPITAL_COMMUNITY): Payer: Medicare Other

## 2012-09-26 ENCOUNTER — Other Ambulatory Visit (HOSPITAL_COMMUNITY): Payer: Self-pay | Admitting: Oncology

## 2012-09-26 ENCOUNTER — Emergency Department (HOSPITAL_COMMUNITY)
Admission: EM | Admit: 2012-09-26 | Discharge: 2012-09-26 | Disposition: A | Discharge status: Home / Self Care | Payer: Medicare Other | Attending: Emergency Medicine | Admitting: Emergency Medicine

## 2012-09-26 DIAGNOSIS — F17200 Nicotine dependence, unspecified, uncomplicated: Secondary | ICD-10-CM | POA: Insufficient documentation

## 2012-09-26 DIAGNOSIS — C50919 Malignant neoplasm of unspecified site of unspecified female breast: Secondary | ICD-10-CM | POA: Insufficient documentation

## 2012-09-26 DIAGNOSIS — R062 Wheezing: Secondary | ICD-10-CM | POA: Insufficient documentation

## 2012-09-26 DIAGNOSIS — R059 Cough, unspecified: Secondary | ICD-10-CM | POA: Insufficient documentation

## 2012-09-26 DIAGNOSIS — R05 Cough: Secondary | ICD-10-CM | POA: Insufficient documentation

## 2012-09-26 DIAGNOSIS — Z791 Long term (current) use of non-steroidal anti-inflammatories (NSAID): Secondary | ICD-10-CM | POA: Insufficient documentation

## 2012-09-26 DIAGNOSIS — E119 Type 2 diabetes mellitus without complications: Secondary | ICD-10-CM | POA: Insufficient documentation

## 2012-09-26 DIAGNOSIS — J40 Bronchitis, not specified as acute or chronic: Secondary | ICD-10-CM | POA: Insufficient documentation

## 2012-09-26 DIAGNOSIS — F3289 Other specified depressive episodes: Secondary | ICD-10-CM | POA: Insufficient documentation

## 2012-09-26 DIAGNOSIS — F411 Generalized anxiety disorder: Secondary | ICD-10-CM | POA: Insufficient documentation

## 2012-09-26 DIAGNOSIS — Z79899 Other long term (current) drug therapy: Secondary | ICD-10-CM | POA: Insufficient documentation

## 2012-09-26 DIAGNOSIS — K219 Gastro-esophageal reflux disease without esophagitis: Secondary | ICD-10-CM | POA: Insufficient documentation

## 2012-09-26 DIAGNOSIS — F329 Major depressive disorder, single episode, unspecified: Secondary | ICD-10-CM | POA: Insufficient documentation

## 2012-09-26 MED ORDER — ALBUTEROL SULFATE (5 MG/ML) 0.5% IN NEBU
INHALATION_SOLUTION | RESPIRATORY_TRACT | Status: AC
  Filled 2012-09-26: qty 0.5

## 2012-09-26 MED ORDER — PREDNISONE 20 MG PO TABS: 60.0000 mg | ORAL_TABLET | Freq: Every day | ORAL | Status: DC

## 2012-09-26 MED ORDER — ALBUTEROL SULFATE HFA 108 (90 BASE) MCG/ACT IN AERS: 2.0000 | INHALATION_SPRAY | Freq: Four times a day (QID) | RESPIRATORY_TRACT | Status: DC | PRN

## 2012-09-26 MED ORDER — PREDNISONE 20 MG PO TABS
60.0000 mg | ORAL_TABLET | Freq: Once | ORAL | Status: AC
  Administered 2012-09-26: 60 mg via ORAL
  Filled 2012-09-26: qty 3

## 2012-09-26 MED ORDER — ALBUTEROL SULFATE (5 MG/ML) 0.5% IN NEBU
2.5000 mg | INHALATION_SOLUTION | Freq: Once | RESPIRATORY_TRACT | Status: AC
  Administered 2012-09-26: 2.5 mg via RESPIRATORY_TRACT
  Filled 2012-09-26: qty 0.5

## 2012-09-26 MED ORDER — ALBUTEROL SULFATE (5 MG/ML) 0.5% IN NEBU
5.0000 mg | INHALATION_SOLUTION | Freq: Once | RESPIRATORY_TRACT | Status: AC
  Administered 2012-09-26: 5 mg via RESPIRATORY_TRACT
  Filled 2012-09-26: qty 1

## 2012-09-26 MED ORDER — ALBUTEROL SULFATE (5 MG/ML) 0.5% IN NEBU
2.5000 mg | INHALATION_SOLUTION | Freq: Once | RESPIRATORY_TRACT | Status: AC
Start: 2012-09-26 — End: 2012-09-26
  Administered 2012-09-26: 2.5 mg via RESPIRATORY_TRACT

## 2012-09-26 NOTE — ED Notes (Signed)
Pt reports recent dx of bronchitis x 3 weeks, reports going to UC and "back and forth from my doctor" for same, she states that the NP at her PMD seems to not listen to her when she tells her about the swelling in her face.  Pt reports facial swelling every morning when she wakes up x 1 week.  Pt also reports SOB-worse with exertion.  Denies noticing swelling in her ankles or feet at this time.  Pt is A&Ox 4.  Able to finish her sentences.  Pt has early stage of COPD.  Pt denies any cp at this time but reports dizziness, worse when bending over.

## 2012-09-26 NOTE — ED Notes (Signed)
Lisa RT at bedside

## 2012-09-26 NOTE — ED Notes (Signed)
Please see triage note

## 2012-09-26 NOTE — ED Provider Notes (Signed)
CSN: [id number]     Arrival date & time 09/26/12  4098 History     First MD Initiated Contact with Patient 09/26/12 1014     Chief Complaint  Patient presents with  . Shortness of Breath   (Consider location/radiation/quality/duration/timing/severity/associated sxs/prior Treatment) HPI Comments: Patient is a 52 year old female past medical history significant for DM, GERD, Anxiety, Depression presenting to the ED for four weeks of continued SOB and non-productive cough. Patient states she was evaluated by her PCP initially and given a Z-pak for bronchitis. Pt states her symptoms continued and she then went to [facility] where she was again told she had bronchitis and d/c'd without any supportive care. Patient has taken her Advair and rescue inhaler with some relief. She states her SOB and cough is worse at night time and with exertion. Pt denies any fevers, chills, nausea, vomiting, sore throat, nasal congestion, unilateral leg swelling, recent surgeries or travel. Pt has chronic CP and denies any new or changes to CP.   Patient is a 52 y.o. female presenting with shortness of breath.  Shortness of Breath Associated symptoms: cough and wheezing   Associated symptoms: no fever      Past Medical History  Diagnosis Date  . Anxiety   . Diabetes mellitus   . GERD (gastroesophageal reflux disease)   . Pancreatitis   . Breast cancer     rt breast  . Depression    Past Surgical History  Procedure Laterality Date  . Abdominal hysterectomy      complete  . Tonsilectomy, adenoidectomy, bilateral myringotomy and tubes    . Bone fusion rt heel    . Tonsillectomy    . Breast lumpectomy  2009    Right breast  . Eus  03/06/2012    Procedure: ESOPHAGEAL ENDOSCOPIC ULTRASOUND (EUS) RADIAL;  Surgeon: Willis Modena, MD;  Location: [facility];  Service: Endoscopy;  Laterality: N/A;   Family History  Problem Relation Age of Onset  . Cancer Mother    History  Substance Use Topics  . Smoking  status: Current Every Day Smoker -- 1.00 packs/day for 35 years    Types: Cigarettes  . Smokeless tobacco: Current User  . Alcohol Use: No   OB History   Grav Para Term Preterm Abortions TAB SAB Ect Mult Living                 Review of Systems  Constitutional: Negative for fever and chills.  HENT: Negative.   Eyes: Negative.   Respiratory: Positive for cough, shortness of breath and wheezing.   Cardiovascular: Negative.   Gastrointestinal: Negative.   Genitourinary: Negative.   Musculoskeletal: Negative.   Skin: Negative.   Neurological: Negative.     Allergies  Gadolinium  Home Medications   Current Outpatient Rx  Name  Route  Sig  Dispense  Refill  . albuterol (PROVENTIL HFA;VENTOLIN HFA) 108 (90 BASE) MCG/ACT inhaler   Inhalation   Inhale 2 puffs into the lungs every 4 (four) hours as needed. For shortness of breath or wheezing         . alendronate (FOSAMAX) 70 MG tablet   Oral   Take 70 mg by mouth every Sunday. Take with a full glass of water on an empty stomach.         . Alum & Mag Hydroxide-Simeth (MAGIC MOUTHWASH W/LIDOCAINE) SOLN   Oral   Take 5 mLs by mouth 2 (two) times daily.         . calcium-vitamin  D (OSCAL WITH D) 500-200 MG-UNIT per tablet   Oral   Take 1 tablet by mouth 2 (two) times daily.         . diazepam (VALIUM) 5 MG tablet   Oral   Take 5-10 mg by mouth every 8 (eight) hours as needed (for back pain).         Marland Kitchen diclofenac sodium (VOLTAREN) 1 % GEL   Topical   Apply 1 application topically 4 (four) times daily as needed. Apply to painful areas on hands for arthritis         . ferrous sulfate 325 (65 FE) MG tablet   Oral   Take 325 mg by mouth at bedtime. (OTC iron)         . gabapentin (NEURONTIN) 400 MG tablet   Oral   Take 800 mg by mouth 3 (three) times daily.           Marland Kitchen gemfibrozil (LOPID) 600 MG tablet   Oral   Take 600 mg by mouth 2 (two) times daily.          Marland Kitchen glipiZIDE (GLUCOTROL) 10 MG tablet    Oral   Take 10-20 mg by mouth 2 (two) times daily before a meal. Take 2 tablets by mouth in the morning and then 1 tablet in the evening         . hydrOXYzine (ATARAX/VISTARIL) 25 MG tablet   Oral   Take 25 mg by mouth at bedtime. For itching         . ibuprofen (ADVIL,MOTRIN) 600 MG tablet   Oral   Take 1 tablet (600 mg total) by mouth every 6 (six) hours as needed for pain.   30 tablet   0   . letrozole (FEMARA) 2.5 MG tablet   Oral   Take 2.5 mg by mouth every morning.          Marland Kitchen LORazepam (ATIVAN) 2 MG tablet   Oral   Take 2 mg by mouth at bedtime. By Dr. Clent Ridges         . metFORMIN (GLUCOPHAGE) 1000 MG tablet   Oral   Take 1,000 mg by mouth 2 (two) times daily with a meal.          . omeprazole (PRILOSEC) 20 MG capsule   Oral   Take 20 mg by mouth every morning.          . sertraline (ZOLOFT) 100 MG tablet   Oral   Take 100 mg by mouth at bedtime.          Marland Kitchen albuterol (PROVENTIL HFA;VENTOLIN HFA) 108 (90 BASE) MCG/ACT inhaler   Inhalation   Inhale 2 puffs into the lungs every 6 (six) hours as needed for wheezing.   1 Inhaler   2   . predniSONE (DELTASONE) 20 MG tablet   Oral   Take 3 tablets (60 mg total) by mouth daily.   15 tablet   0    BP 116/75  Pulse 102  Temp(Src) 99.2 F (37.3 C) (Oral)  Resp 20  SpO2 99% Physical Exam  Constitutional: She is oriented to person, place, and time. She appears well-developed and well-nourished. No distress.  HENT:  Head: Normocephalic and atraumatic.  Right Ear: External ear normal.  Left Ear: External ear normal.  Mouth/Throat: Oropharynx is clear and moist. No oropharyngeal exudate.  Eyes: Conjunctivae are normal.  Neck: Neck supple.  Cardiovascular: Normal rate, regular rhythm, normal heart sounds and intact distal pulses.  Pulmonary/Chest: Effort normal. No accessory muscle usage. Not tachypneic and not bradypneic. No respiratory distress. She has wheezes.  Reproducible chest tenderness at  baseline per patient.   Abdominal: Soft. There is no tenderness.  Musculoskeletal: She exhibits no edema.  Lymphadenopathy:    She has no cervical adenopathy.  Neurological: She is alert and oriented to person, place, and time.  Skin: Skin is warm and dry. She is not diaphoretic. No cyanosis. No pallor.    ED Course   Procedures (including critical care time)  Labs Reviewed  GLUCOSE, CAPILLARY - Abnormal; Notable for the following:    Glucose-Capillary 162 (*)    All other components within normal limits   Dg Chest 2 View  09/26/2012   *RADIOLOGY REPORT*  Clinical Data: Shortness of breath, COPD, smoker  CHEST - 2 VIEW  Comparison: 08/06/2012  Findings: Cardiomediastinal silhouette is stable.  No acute infiltrate or pulmonary edema.  Stable chronic mild interstitial prominence and minimal central bronchitic changes. The bony thorax is unremarkable.  IMPRESSION: No acute infiltrate or pulmonary edema.  Stable chronic mild interstitial prominence and minimal central bronchitic changes.   Original Report Authenticated By: Natasha Mead, M.D.   1. Bronchitis     MDM  Pt CXR negative for acute infiltrate. Patients symptoms are consistent with bronchitis. Wheezing improved after nebulizer and Prednisone. Pt endorsed symptoms improved. Discussed that antibiotics are not indicated for viral infections. Pt will be discharged with Prednisone and Albuterol inhaler. Verbalizes understanding and is agreeable with plan. Pt is hemodynamically stable & in NAD prior to dc. Patient d/w with Dr. Silverio Lay, agrees with plan.     Jeannetta Ellis, PA-C 09/26/12 1555

## 2012-09-26 NOTE — ED Notes (Signed)
RT contacted to start pt on neb

## 2012-09-27 ENCOUNTER — Other Ambulatory Visit (HOSPITAL_COMMUNITY): Payer: Self-pay | Admitting: Oncology

## 2012-09-30 NOTE — ED Provider Notes (Signed)
Medical screening examination/treatment/procedure(s) were performed by non-physician practitioner and as supervising physician I was immediately available for consultation/collaboration.   [name], MD 09/30/12 1059

## 2012-10-03 ENCOUNTER — Ambulatory Visit (INDEPENDENT_AMBULATORY_CARE_PROVIDER_SITE_OTHER): Payer: Medicare Other | Admitting: Internal Medicine

## 2012-10-03 ENCOUNTER — Encounter: Payer: Self-pay | Admitting: Internal Medicine

## 2012-10-03 VITALS — BP 112/84 | HR 100 | Temp 97.7°F | Ht 65.0 in | Wt 173.8 lb

## 2012-10-03 DIAGNOSIS — F17200 Nicotine dependence, unspecified, uncomplicated: Secondary | ICD-10-CM

## 2012-10-03 DIAGNOSIS — J449 Chronic obstructive pulmonary disease, unspecified: Secondary | ICD-10-CM

## 2012-10-03 NOTE — Progress Notes (Signed)
  Subjective:    Patient ID: Emily [name], female    DOB: 1960-12-22   MRN: [id number]  HPI  67 yowm active smoker with dx of recurrent bronchitis maybe once a year in winter since 2009 referred 10/03/2012 to pulmonary clinic by Ellin Saba Claris Che NP at [facility]) for new onset doe.  10/03/2012 1st pulmonary eval/[facility] abrupt onset sob with exertion assoc with abrupt cough with discolered mucus x 2 months prior to OV  rx zpak / predisone and 2 ER visits with doe x 50 ft and sensation of something stuck in throat with rattling cough but no excess or purulent sputum, assoc with mild dysphagia.    No obvious daytime variabilty or  cp or chest tightness, subjective wheeze overt sinus or hb symptoms. No unusual exp hx or h/o childhood pna/ asthma or knowledge of premature birth.   Sleeping ok without nocturnal  or early am exacerbation  of respiratory  c/o's or need for noct saba. Also denies any obvious fluctuation of symptoms with weather or environmental changes or other aggravating or alleviating factors except as outlined above   Review of Systems  Constitutional: Negative for fever, chills and unexpected weight change.  HENT: Positive for sore throat, trouble swallowing and dental problem. Negative for ear pain, nosebleeds, congestion, rhinorrhea, sneezing, voice change, postnasal drip and sinus pressure.   Eyes: Negative for visual disturbance.  Respiratory: Positive for cough and shortness of breath. Negative for choking.   Cardiovascular: Negative for chest pain and leg swelling.  Gastrointestinal: Positive for abdominal pain. Negative for vomiting and diarrhea.  Genitourinary: Negative for difficulty urinating.       Indigestion  Musculoskeletal: Negative for arthralgias.  Skin: Negative for rash.  Neurological: Negative for tremors, syncope and headaches.  Hematological: Does not bruise/bleed easily.       Objective:   Physical Exam  amb wf nad  Wt Readings from Last 3 Encounters:   10/03/12 173 lb 12.8 oz (78.835 kg)  09/06/12 165 lb (74.844 kg)  04/17/12 166 lb (75.297 kg)     HEENT mild turbinate edema.  Oropharynx no thrush or excess pnd or cobblestoning.  No JVD or cervical adenopathy. Mild accessory muscle hypertrophy. Trachea midline, nl thryroid. Chest was hyperinflated by percussion with diminished breath sounds and moderate increased exp time without wheeze. Hoover sign positive at mid inspiration. Regular rate and rhythm without murmur gallop or rub or increase P2 or edema.  Abd: no hsm, nl excursion. Ext warm without cyanosis or clubbing.     cxr 09/26/12  No acute infiltrate or pulmonary edema. Stable chronic mild  interstitial prominence and minimal central bronchitic changes.     Assessment & Plan:

## 2012-10-03 NOTE — Assessment & Plan Note (Signed)

## 2012-10-03 NOTE — Patient Instructions (Addendum)
Try prilosec 20mg   Take 30-60 min before first meal of the day and Pepcid 20 mg one bedtime until return and stop fosfamax for now  Dulera 100 Take 2 puffs first thing in am and then another 2 puffs about 12 hours later.   Only use your albuterol (proaire) as a rescue medication to be used if you can't catch your breath by resting or doing a relaxed purse lip breathing pattern. The less you use it, the better it will work when you need it.    Work on inhaler technique:  relax and gently blow all the way out then take a nice smooth deep breath back in, triggering the inhaler at same time you start breathing in.  Hold for up to 5 seconds if you can.  Rinse and gargle with water when done  GERD (REFLUX)  is an extremely common cause of respiratory symptoms, many times with no significant heartburn at all.    It can be treated with medication, but also with lifestyle changes including avoidance of late meals, excessive alcohol, smoking cessation, and avoid fatty foods, chocolate, peppermint, colas, red wine, and acidic juices such as orange juice.  NO MINT OR MENTHOL PRODUCTS SO NO COUGH DROPS  USE SUGARLESS CANDY INSTEAD (jolley ranchers or Stover's)  NO OIL BASED VITAMINS - use powdered substitutes.    Please schedule a follow up office visit in 2 weeks, sooner if needed

## 2012-10-03 NOTE — Assessment & Plan Note (Addendum)
DDX of  difficult airways managment all start with A and  include Adherence, Ace Inhibitors, Acid Reflux, Active Sinus Disease, Alpha 1 Antitripsin deficiency, Anxiety masquerading as Airways dz,  ABPA,  allergy(esp in young), Aspiration (esp in elderly), Adverse effects of DPI,  Active smokers, plus two Bs  = Bronchiectasis and Beta blocker use..and one C= CHF  Adherence is always the initial "prime suspect" and is a multilayered concern that requires a "trust but verify" approach in every patient - starting with knowing how to use medications, especially inhalers, correctly, keeping up with refills and understanding the fundamental difference between maintenance and prns vs those medications only taken for a very short course and then stopped and not refilled.  The proper method of use, as well as anticipated side effects, of a metered-dose inhaler are discussed and demonstrated to the patient. Improved effectiveness after extensive coaching during this visit to a level of approximately  75% so start dulera 100 2 bid to cover any asthmatic component until regroup   ? Acid reflux > max acid suppression and diet/ reviewed plus stop biphosphonates for now   Active smoking discussed separately

## 2012-10-04 ENCOUNTER — Encounter (HOSPITAL_COMMUNITY): Payer: Self-pay | Admitting: Oncology

## 2012-10-15 ENCOUNTER — Encounter (HOSPITAL_COMMUNITY): Payer: Medicare Other | Attending: Hematology and Oncology

## 2012-10-15 ENCOUNTER — Other Ambulatory Visit (HOSPITAL_COMMUNITY): Payer: Self-pay | Admitting: Oncology

## 2012-10-15 DIAGNOSIS — Z09 Encounter for follow-up examination after completed treatment for conditions other than malignant neoplasm: Secondary | ICD-10-CM | POA: Insufficient documentation

## 2012-10-15 DIAGNOSIS — C50419 Malignant neoplasm of upper-outer quadrant of unspecified female breast: Secondary | ICD-10-CM

## 2012-10-15 DIAGNOSIS — Z853 Personal history of malignant neoplasm of breast: Secondary | ICD-10-CM | POA: Insufficient documentation

## 2012-10-15 DIAGNOSIS — C50919 Malignant neoplasm of unspecified site of unspecified female breast: Secondary | ICD-10-CM

## 2012-10-15 DIAGNOSIS — E876 Hypokalemia: Secondary | ICD-10-CM

## 2012-10-15 LAB — COMPREHENSIVE METABOLIC PANEL
Alkaline Phosphatase: 46 U/L (ref 39–117)
BUN: 11 mg/dL (ref 6–23)
Chloride: 96 mEq/L (ref 96–112)
GFR calc Af Amer: >90 mL/min (ref >90)
Glucose, Bld: 208 mg/dL — ABNORMAL HIGH (ref 70–99)
Potassium: 3 mEq/L — ABNORMAL LOW (ref 3.5–5.1)
Total Bilirubin: 0.8 mg/dL (ref 0.3–1.2)

## 2012-10-15 LAB — CBC WITH DIFFERENTIAL/PLATELET
Eosinophils Absolute: 0.2 10*3/uL (ref 0.0–0.7)
HCT: 38.6 % (ref 36.0–46.0)
Hemoglobin: 12.5 g/dL (ref 12.0–15.0)
Lymphs Abs: 2.9 10*3/uL (ref 0.7–4.0)
MCH: 27.8 pg (ref 26.0–34.0)
Monocytes Relative: 5 % (ref 3–12)
Neutro Abs: 5.4 10*3/uL (ref 1.7–7.7)
Neutrophils Relative %: 60 % (ref 43–77)
RBC: 4.5 MIL/uL (ref 3.87–5.11)

## 2012-10-15 MED ORDER — POTASSIUM CHLORIDE CRYS ER 20 MEQ PO TBCR: EXTENDED_RELEASE_TABLET | ORAL | Status: DC

## 2012-10-15 NOTE — Addendum Note (Signed)
Addended by: Edythe Lynn A on: 10/15/2012 04:45 PM   Modules accepted: Orders

## 2012-10-15 NOTE — Progress Notes (Signed)
Labs drawn today for cbc/diff,cmp 

## 2012-10-18 ENCOUNTER — Encounter (HOSPITAL_BASED_OUTPATIENT_CLINIC_OR_DEPARTMENT_OTHER): Payer: Medicare Other

## 2012-10-18 ENCOUNTER — Encounter (HOSPITAL_COMMUNITY): Payer: Self-pay

## 2012-10-18 VITALS — BP 126/88 | HR 107 | Temp 97.3°F | Resp 18

## 2012-10-18 DIAGNOSIS — J449 Chronic obstructive pulmonary disease, unspecified: Secondary | ICD-10-CM

## 2012-10-18 DIAGNOSIS — C50911 Malignant neoplasm of unspecified site of right female breast: Secondary | ICD-10-CM

## 2012-10-18 DIAGNOSIS — J4489 Other specified chronic obstructive pulmonary disease: Secondary | ICD-10-CM

## 2012-10-18 DIAGNOSIS — E119 Type 2 diabetes mellitus without complications: Secondary | ICD-10-CM

## 2012-10-18 DIAGNOSIS — C50419 Malignant neoplasm of upper-outer quadrant of unspecified female breast: Secondary | ICD-10-CM

## 2012-10-18 NOTE — Patient Instructions (Addendum)
[facility] Discharge Instructions  RECOMMENDATIONS MADE BY THE CONSULTANT AND ANY TEST RESULTS WILL BE SENT TO YOUR REFERRING PHYSICIAN.  EXAM FINDINGS BY THE PHYSICIAN TODAY AND SIGNS OR SYMPTOMS TO REPORT TO CLINIC OR PRIMARY PHYSICIAN: Exam and discussion by Dr. Sharia Reeve.  No evidence of recurrence by exam.  MEDICATIONS PRESCRIBED:  Continue the letrozole  INSTRUCTIONS GIVEN AND DISCUSSED: Report any new lumps, bone pain, shortness of breath or other symptoms.  SPECIAL INSTRUCTIONS/FOLLOW-UP: Follow-up in 5 months.  Thank you for choosing [facility] to provide your oncology and hematology care.  To afford each patient quality time with our providers, please arrive at least 15 minutes before your scheduled appointment time.  With your help, our goal is to use those 15 minutes to complete the necessary work-up to ensure our physicians have the information they need to help with your evaluation and healthcare recommendations.    Effective January 1st, 2014, we ask that you re-schedule your appointment with our physicians should you arrive 10 or more minutes late for your appointment.  We strive to give you quality time with our providers, and arriving late affects you and other patients whose appointments are after yours.    Again, thank you for choosing [facility].  Our hope is that these requests will decrease the amount of time that you wait before being seen by our physicians.       _____________________________________________________________  Should you have questions after your visit to [facility], please contact our office at [phone] between the hours of 8:30 a.m. and 5:00 p.m.  Voicemails left after 4:30 p.m. will not be returned until the following business day.  For prescription refill requests, have your pharmacy contact our office with your prescription refill request.

## 2012-10-18 NOTE — Progress Notes (Signed)
[facility] [phone]   [phone]  OFFICE PROGRESS NOTE  [name], MD [address] [place] [address] [place] [address]  DIAGNOSIS: Stage IIIa invasive ducta carcinoma of the right breast.   ONCOLOGIC HISTORY: according to Dr. Mariel Sleet; stage IIIa invasive ductal carcinoma the right breast intermediate grade 3.1 cm with 5 of 7 positive nodes with extracapsular extension ER +55% PR +31% HER-2/neu not amplified Ki-67 marker 14% status post a.c. x4 cycles dose dense fashion followed by weekly Taxol x12 followed by radiation therapy. She was started on tamoxifen 03/13/2008 was unable to tolerate tamoxifen and also could not tolerate Arimidex so she is now on letrozole 2.5 mg once a day and she will continue that for total 5 years and started tamoxifen on 03/13/2008 .  INTERVAL HISTORY:   Emily [name] 52 y.o. female returns to the clinic today for scheduled followup.  She has  shortness or breath (unchanged) . She is a smoker and smokes half  A pack of cigarettes a day. She has not noticed any new breast lumps.  Does complain of generalized aching.  She denies any bone pain. She is a denies any new breast lumps.   MEDICAL HISTORY: Past Medical History  Diagnosis Date  . Anxiety   . Diabetes mellitus   . GERD (gastroesophageal reflux disease)   . Pancreatitis   . Breast cancer     rt breast  . Depression     Past Surgical History  Procedure Laterality Date  . Abdominal hysterectomy      complete  . Tonsilectomy, adenoidectomy, bilateral myringotomy and tubes    . Bone fusion rt heel    . Tonsillectomy    . Breast lumpectomy  2009    Right breast  . Eus  03/06/2012    Procedure: ESOPHAGEAL ENDOSCOPIC ULTRASOUND (EUS) RADIAL;  Surgeon: Willis Modena, MD;  Location: [facility];  Service: Endoscopy;  Laterality: N/A;     REVIEW OF SYSTEMS: 14 point review of system is as in the history above otherwise negative.  PHYSICAL  EXAMINATION:  Blood pressure 126/88, pulse 107, temperature 97.3 F (36.3 C), temperature source Oral, resp. rate 18. GENERAL: No acute distress. SKIN:  No rashes or significant lesions . No ecchymosis or petechial rash. HEAD: Normocephalic, No masses, lesions, tenderness or abnormalities  EYES: Conjunctiva are pink and non-injected and no jaundice ENT: No evidence of mucositis or thrush. LYMPH: No palpable lymphadenopathy, in the neck, supraclavicular areas or axilla. BREAST: posttreatment changes in the right breast.  Left breast was unremarkable. LUNGS: Clear to auscultation , no crackles or wheezes HEART: regular rate & rhythm, no murmurs, no gallops, S1 normal and S2 normal and no S3. ABDOMEN: Abdomen soft, non-tender, no masses or organomegaly and no hepatosplenomegaly palpable EXTREMITIES: No edema, no skin discoloration or tenderness NEURO: Alert & oriented , no focal motor  deficits.    LABORATORY DATA: Lab Results  Component Value Date   WBC 9.0 10/15/2012   HGB 12.5 10/15/2012   HCT 38.6 10/15/2012   MCV 85.8 10/15/2012   PLT 243 10/15/2012      Chemistry      Component Value Date/Time   NA 134* 10/15/2012 1018   K 3.0* 10/15/2012 1018   CL 96 10/15/2012 1018   CO2 27 10/15/2012 1018   BUN 11 10/15/2012 1018   CREATININE 0.64 10/15/2012 1018      Component Value Date/Time   CALCIUM 9.2 10/15/2012 1018  ALKPHOS 46 10/15/2012 1018   AST 26 10/15/2012 1018   ALT 14 10/15/2012 1018   BILITOT 0.8 10/15/2012 1018       RADIOGRAPHIC STUDIES: Dg Chest 2 View 09/26/2012   *RADIOLOGY REPORT*  Clinical Data: Shortness of breath, COPD, smoker  CHEST - 2 VIEW  Comparison: 08/06/2012 .  IMPRESSION: No acute infiltrate or pulmonary edema.  Stable chronic mild interstitial prominence and minimal central bronchitic changes.   Original Report Authenticated By: Natasha Mead, M.D.     ASSESSMENT:  Stage IIIa invasive ductal carcinoma the right breast s/p breast and the return treatment  with tri-modality therapy.  Patient is on endocrine therapy with letrozole due for completion in January of 2015.  She has no clinical evidence of disease at this time. Her most recent abnormal mammogram  resulted in biopsy which was non diagnostic for malignancy. Her shortness of breath, most likely is from COPD and smoking related issues.    PLAN:  1. She'll return to clinic in January 2015 at that time letrozole can be stopped. 2. Patient was counseled on Smoking Cessation. 3. Continue yearly mammogram    All questions were satisfactorily answered. Patient knows to call if  any concern arises.  I spent more than 50 % counseling the patient face to face. The total time spent in the appointment was 30 minutes.   Sherral Hammers, MD FACP. Hematology/Oncology.

## 2012-10-19 ENCOUNTER — Other Ambulatory Visit (HOSPITAL_COMMUNITY): Payer: Self-pay | Admitting: Oncology

## 2012-10-21 ENCOUNTER — Ambulatory Visit (INDEPENDENT_AMBULATORY_CARE_PROVIDER_SITE_OTHER): Payer: Medicare Other | Admitting: Internal Medicine

## 2012-10-21 ENCOUNTER — Encounter: Payer: Self-pay | Admitting: Internal Medicine

## 2012-10-21 VITALS — BP 110/80 | HR 111 | Temp 97.2°F | Ht 65.0 in | Wt 179.2 lb

## 2012-10-21 DIAGNOSIS — R0609 Other forms of dyspnea: Secondary | ICD-10-CM

## 2012-10-21 DIAGNOSIS — F17200 Nicotine dependence, unspecified, uncomplicated: Secondary | ICD-10-CM

## 2012-10-21 DIAGNOSIS — R0600 Dyspnea, unspecified: Secondary | ICD-10-CM

## 2012-10-21 MED ORDER — BUDESONIDE-FORMOTEROL FUMARATE 160-4.5 MCG/ACT IN AERO: INHALATION_SPRAY | RESPIRATORY_TRACT | Status: DC

## 2012-10-21 NOTE — Patient Instructions (Addendum)
Work on inhaler technique:  relax and gently blow all the way out then take a nice smooth deep breath back in, triggering the inhaler at same time you start breathing in.  Hold for up to 5 seconds if you can.  Rinse and gargle with water when done  Symbicort 160 Take 2 puffs first thing in am and then another 2 puffs about 12 hours later.   The key is to stop smoking completely before smoking completely stops you!   Your main problem with your breathing is you abdominal distention > see Dr Dulce Sellar asap   If you are satisfied with your treatment plan let your doctor know and he/she can either refill your medications or you can return here when your prescription runs out.     If in any way you are not 100% satisfied,  please tell us.  If 100% better, tell your friends!

## 2012-10-21 NOTE — Progress Notes (Signed)
Subjective:    Patient ID: Emily [name], female    DOB: 05-04-1960   MRN: [id number]    Brief patient profile:  43 yowf active smoker with dx of recurrent bronchitis maybe once a year in winter since 2009 referred 10/03/2012 to pulmonary clinic by Ellin Saba Claris Che NP at [facility]) for new onset doe around the first of June 2014    HPI 10/03/2012 1st pulmonary eval/Abdou Stocks cc abrupt onset sob with exertion assoc with abrupt cough with discolered mucus x 2 months prior to OV  rx zpak / predisone and 2 ER visits with doe x 50 ft and sensation of something stuck in throat with rattling cough but no excess or purulent sputum, assoc with mild dysphagia.   rec Try prilosec 20mg   Take 30-60 min before first meal of the day and Pepcid 20 mg one bedtime until return and stop fosfamax for now Dulera 100 Take 2 puffs first thing in am and then another 2 puffs about 12 hours later.  work on inhaler technique:  GERD diet   10/21/2012 f/u ov/Jannah Guardiola re sob   only with exertion like parking lot Chief Complaint  Patient presents with  . Follow-up    Pt reports that she still finds it diff to catch her breath, having some bilateral swelling in legs, also reports increasing abd pain and reports it is getting larger   min am cough, no purlent sputum   No obvious daytime variabilty or  cp or chest tightness, subjective wheeze overt sinus or hb symptoms. No unusual exp hx or h/o childhood pna/ asthma or knowledge of premature birth.   Sleeping ok without nocturnal  or early am exacerbation  of respiratory  c/o's or need for noct saba. Also denies any obvious fluctuation of symptoms with weather or environmental changes or other aggravating or alleviating factors except as outlined above   Current Medications, Allergies, Past Medical History, Past Surgical History, Family History, and Social History were reviewed in [employer] record.  ROS  The following are not active complaints unless  bolded sore throat, dysphagia, dental problems, itching, sneezing,  nasal congestion or excess/ purulent secretions, ear ache,   fever, chills, sweats, unintended wt loss, pleuritic or exertional cp, hemoptysis,  orthopnea pnd or leg swelling, presyncope, palpitations, heartburn, abdominal pain, anorexia, nausea, vomiting, diarrhea  or change in bowel or urinary habits, change in stools or urine, dysuria,hematuria,  rash, arthralgias, visual complaints, headache, numbness weakness or ataxia or problems with walking or coordination,  change in mood/affect or memory.           Objective:  Physical Exam  amb wf nad   10/21/2012       179  Wt Readings from Last 3 Encounters:  10/03/12 173 lb 12.8 oz (78.835 kg)  09/06/12 165 lb (74.844 kg)  04/17/12 166 lb (75.297 kg)     HEENT mild turbinate edema.  Oropharynx no thrush or excess pnd or cobblestoning.  No JVD or cervical adenopathy. Mild accessory muscle hypertrophy. Trachea midline, nl thryroid. Chest was hyperinflated by percussion with diminished breath sounds and moderate increased exp time without wheeze. Hoover sign positive at mid inspiration. Regular rate and rhythm without murmur gallop or rub or increase P2 / trace pitting  edema.  Abd: very protuberant abd/ no hsm, nl excursion. Ext warm without cyanosis or clubbing.     cxr 09/26/12  No acute infiltrate or pulmonary edema. Stable chronic mild  interstitial prominence and minimal central bronchitic  changes.     Assessment & Plan:

## 2012-10-22 NOTE — Assessment & Plan Note (Addendum)
-   PFT's 03/18/12 FEV1  1.93 (68%) ratio 78 and min convexity to f/v curve - HFA 75% p coaching 10/03/2012 > 25% 10/21/2012 at baseline, up to 50% p extensive coaching  She has definitely lost ground with inhaler technique bu still actively inhaling cigarettes, a recipe for further decline (discussed separately)  For now would just continue symbicort 160 2 bid for any AB component but told her flat out this only treats the symptoms and that stopping smoking was the better option   Much of her recent doe may be related to poor Cabd > referred back to Dr Dulce Sellar, her GI doctor of record

## 2012-10-22 NOTE — Assessment & Plan Note (Signed)
I reviewed the Flethcher curve with patient that basically indicates  if you quit smoking when your best day FEV1 is still well preserved (as hers is)  it is highly unlikely you will progress to severe disease and informed the patient there was no medication on the market that has proven to change the curve or the likelihood of progression.  Therefore stopping smoking and maintaining abstinence is the most important aspect of care, not choice of inhalers or for that matter, doctors.   

## 2012-10-24 ENCOUNTER — Other Ambulatory Visit: Payer: Self-pay | Admitting: Gastroenterology

## 2012-10-24 DIAGNOSIS — R188 Other ascites: Secondary | ICD-10-CM

## 2012-10-26 ENCOUNTER — Inpatient Hospital Stay (HOSPITAL_COMMUNITY)
Admission: EM | Admit: 2012-10-26 | Discharge: 2012-11-05 | DRG: 286 | Disposition: A | Discharge status: Home Health | Payer: Medicare Other | Attending: Internal Medicine | Admitting: Internal Medicine

## 2012-10-26 ENCOUNTER — Emergency Department (HOSPITAL_COMMUNITY): Payer: Medicare Other

## 2012-10-26 ENCOUNTER — Encounter (HOSPITAL_COMMUNITY): Payer: Self-pay | Admitting: *Deleted

## 2012-10-26 DIAGNOSIS — E876 Hypokalemia: Secondary | ICD-10-CM | POA: Diagnosis present

## 2012-10-26 DIAGNOSIS — E878 Other disorders of electrolyte and fluid balance, not elsewhere classified: Secondary | ICD-10-CM

## 2012-10-26 DIAGNOSIS — K219 Gastro-esophageal reflux disease without esophagitis: Secondary | ICD-10-CM

## 2012-10-26 DIAGNOSIS — IMO0002 Reserved for concepts with insufficient information to code with codable children: Secondary | ICD-10-CM

## 2012-10-26 DIAGNOSIS — I5021 Acute systolic (congestive) heart failure: Secondary | ICD-10-CM

## 2012-10-26 DIAGNOSIS — F329 Major depressive disorder, single episode, unspecified: Secondary | ICD-10-CM | POA: Diagnosis present

## 2012-10-26 DIAGNOSIS — Z91041 Radiographic dye allergy status: Secondary | ICD-10-CM

## 2012-10-26 DIAGNOSIS — E119 Type 2 diabetes mellitus without complications: Secondary | ICD-10-CM

## 2012-10-26 DIAGNOSIS — I5023 Acute on chronic systolic (congestive) heart failure: Principal | ICD-10-CM | POA: Diagnosis present

## 2012-10-26 DIAGNOSIS — I10 Essential (primary) hypertension: Secondary | ICD-10-CM | POA: Diagnosis present

## 2012-10-26 DIAGNOSIS — I2789 Other specified pulmonary heart diseases: Secondary | ICD-10-CM | POA: Diagnosis present

## 2012-10-26 DIAGNOSIS — R0600 Dyspnea, unspecified: Secondary | ICD-10-CM

## 2012-10-26 DIAGNOSIS — Z9221 Personal history of antineoplastic chemotherapy: Secondary | ICD-10-CM

## 2012-10-26 DIAGNOSIS — Z8249 Family history of ischemic heart disease and other diseases of the circulatory system: Secondary | ICD-10-CM

## 2012-10-26 DIAGNOSIS — Z8052 Family history of malignant neoplasm of bladder: Secondary | ICD-10-CM

## 2012-10-26 DIAGNOSIS — J4489 Other specified chronic obstructive pulmonary disease: Secondary | ICD-10-CM | POA: Diagnosis present

## 2012-10-26 DIAGNOSIS — M1990 Unspecified osteoarthritis, unspecified site: Secondary | ICD-10-CM

## 2012-10-26 DIAGNOSIS — E1169 Type 2 diabetes mellitus with other specified complication: Secondary | ICD-10-CM | POA: Diagnosis present

## 2012-10-26 DIAGNOSIS — Z79899 Other long term (current) drug therapy: Secondary | ICD-10-CM

## 2012-10-26 DIAGNOSIS — Z8041 Family history of malignant neoplasm of ovary: Secondary | ICD-10-CM

## 2012-10-26 DIAGNOSIS — M7989 Other specified soft tissue disorders: Secondary | ICD-10-CM

## 2012-10-26 DIAGNOSIS — E785 Hyperlipidemia, unspecified: Secondary | ICD-10-CM

## 2012-10-26 DIAGNOSIS — E1149 Type 2 diabetes mellitus with other diabetic neurological complication: Secondary | ICD-10-CM | POA: Diagnosis present

## 2012-10-26 DIAGNOSIS — I2510 Atherosclerotic heart disease of native coronary artery without angina pectoris: Secondary | ICD-10-CM | POA: Diagnosis present

## 2012-10-26 DIAGNOSIS — Z853 Personal history of malignant neoplasm of breast: Secondary | ICD-10-CM

## 2012-10-26 DIAGNOSIS — E1165 Type 2 diabetes mellitus with hyperglycemia: Secondary | ICD-10-CM

## 2012-10-26 DIAGNOSIS — T451X5A Adverse effect of antineoplastic and immunosuppressive drugs, initial encounter: Secondary | ICD-10-CM | POA: Diagnosis present

## 2012-10-26 DIAGNOSIS — Z803 Family history of malignant neoplasm of breast: Secondary | ICD-10-CM

## 2012-10-26 DIAGNOSIS — I509 Heart failure, unspecified: Secondary | ICD-10-CM

## 2012-10-26 DIAGNOSIS — R109 Unspecified abdominal pain: Secondary | ICD-10-CM

## 2012-10-26 DIAGNOSIS — F17210 Nicotine dependence, cigarettes, uncomplicated: Secondary | ICD-10-CM

## 2012-10-26 DIAGNOSIS — J449 Chronic obstructive pulmonary disease, unspecified: Secondary | ICD-10-CM | POA: Diagnosis present

## 2012-10-26 DIAGNOSIS — E871 Hypo-osmolality and hyponatremia: Secondary | ICD-10-CM

## 2012-10-26 DIAGNOSIS — R234 Changes in skin texture: Secondary | ICD-10-CM | POA: Diagnosis present

## 2012-10-26 DIAGNOSIS — Z7982 Long term (current) use of aspirin: Secondary | ICD-10-CM

## 2012-10-26 DIAGNOSIS — E1142 Type 2 diabetes mellitus with diabetic polyneuropathy: Secondary | ICD-10-CM | POA: Diagnosis present

## 2012-10-26 DIAGNOSIS — C50919 Malignant neoplasm of unspecified site of unspecified female breast: Secondary | ICD-10-CM | POA: Diagnosis present

## 2012-10-26 DIAGNOSIS — G4700 Insomnia, unspecified: Secondary | ICD-10-CM

## 2012-10-26 DIAGNOSIS — R570 Cardiogenic shock: Secondary | ICD-10-CM

## 2012-10-26 DIAGNOSIS — F17200 Nicotine dependence, unspecified, uncomplicated: Secondary | ICD-10-CM

## 2012-10-26 DIAGNOSIS — Z923 Personal history of irradiation: Secondary | ICD-10-CM

## 2012-10-26 DIAGNOSIS — R0602 Shortness of breath: Secondary | ICD-10-CM

## 2012-10-26 DIAGNOSIS — R0609 Other forms of dyspnea: Secondary | ICD-10-CM

## 2012-10-26 DIAGNOSIS — D509 Iron deficiency anemia, unspecified: Secondary | ICD-10-CM

## 2012-10-26 DIAGNOSIS — F3289 Other specified depressive episodes: Secondary | ICD-10-CM | POA: Diagnosis present

## 2012-10-26 DIAGNOSIS — F411 Generalized anxiety disorder: Secondary | ICD-10-CM | POA: Diagnosis present

## 2012-10-26 DIAGNOSIS — Z841 Family history of disorders of kidney and ureter: Secondary | ICD-10-CM

## 2012-10-26 DIAGNOSIS — N179 Acute kidney failure, unspecified: Secondary | ICD-10-CM | POA: Diagnosis not present

## 2012-10-26 HISTORY — DX: Nicotine dependence, cigarettes, uncomplicated: F17.210

## 2012-10-26 HISTORY — DX: Unspecified osteoarthritis, unspecified site: M19.90

## 2012-10-26 HISTORY — DX: Type 2 diabetes mellitus with diabetic neuropathy, unspecified: E11.40

## 2012-10-26 HISTORY — DX: Hyperlipidemia, unspecified: E78.5

## 2012-10-26 HISTORY — DX: Insomnia, unspecified: G47.00

## 2012-10-26 HISTORY — DX: Iron deficiency anemia, unspecified: D50.9

## 2012-10-26 LAB — URINALYSIS, ROUTINE W REFLEX MICROSCOPIC
Glucose, UA: NEGATIVE mg/dL
Nitrite: NEGATIVE
Specific Gravity, Urine: 1.016 (ref 1.005–1.030)
pH: 5 (ref 5.0–8.0)

## 2012-10-26 LAB — COMPREHENSIVE METABOLIC PANEL
AST: 35 U/L (ref 0–37)
Albumin: 3.4 g/dL — ABNORMAL LOW (ref 3.5–5.2)
Alkaline Phosphatase: 44 U/L (ref 39–117)
CO2: 25 mEq/L (ref 19–32)
Chloride: 94 mEq/L — ABNORMAL LOW (ref 96–112)
GFR calc non Af Amer: >90 mL/min (ref >90)
Potassium: 3.9 mEq/L (ref 3.5–5.1)
Total Bilirubin: 0.9 mg/dL (ref 0.3–1.2)

## 2012-10-26 LAB — CBC WITH DIFFERENTIAL/PLATELET
Basophils Absolute: 0 10*3/uL (ref 0.0–0.1)
Basophils Relative: 0 % (ref 0–1)
HCT: 38.6 % (ref 36.0–46.0)
Hemoglobin: 12.6 g/dL (ref 12.0–15.0)
Lymphocytes Relative: 29 % (ref 12–46)
MCHC: 32.6 g/dL (ref 30.0–36.0)
Monocytes Absolute: 0.7 10*3/uL (ref 0.1–1.0)
Monocytes Relative: 7 % (ref 3–12)
Neutro Abs: 6.4 10*3/uL (ref 1.7–7.7)
Neutrophils Relative %: 62 % (ref 43–77)
WBC: 10.2 10*3/uL (ref 4.0–10.5)

## 2012-10-26 LAB — LIPASE, BLOOD: Lipase: 18 U/L (ref 11–59)

## 2012-10-26 LAB — URINE MICROSCOPIC-ADD ON

## 2012-10-26 LAB — PROTIME-INR: INR: 1.32 (ref 0.00–1.49)

## 2012-10-26 LAB — GLUCOSE, CAPILLARY: Glucose-Capillary: 154 mg/dL — ABNORMAL HIGH (ref 70–99)

## 2012-10-26 MED ORDER — SODIUM CHLORIDE 0.9 % IJ SOLN
3.0000 mL | INTRAMUSCULAR | Status: DC | PRN
  Administered 2012-10-29: 3 mL via INTRAVENOUS

## 2012-10-26 MED ORDER — METOPROLOL TARTRATE 12.5 MG HALF TABLET
12.5000 mg | ORAL_TABLET | Freq: Two times a day (BID) | ORAL | Status: DC
  Administered 2012-10-26 – 2012-10-28 (×4): 12.5 mg via ORAL
  Filled 2012-10-26 (×7): qty 1

## 2012-10-26 MED ORDER — INSULIN ASPART 100 UNIT/ML ~~LOC~~ SOLN
0.0000 [IU] | Freq: Every day | SUBCUTANEOUS | Status: DC
  Administered 2012-10-28: 3 [IU] via SUBCUTANEOUS
  Administered 2012-10-29: 4 [IU] via SUBCUTANEOUS
  Administered 2012-10-30: 2 [IU] via SUBCUTANEOUS
  Administered 2012-10-31: 4 [IU] via SUBCUTANEOUS
  Administered 2012-11-01: 2 [IU] via SUBCUTANEOUS
  Administered 2012-11-02 – 2012-11-03 (×2): 3 [IU] via SUBCUTANEOUS
  Administered 2012-11-03: 2 [IU] via SUBCUTANEOUS

## 2012-10-26 MED ORDER — IPRATROPIUM BROMIDE 0.02 % IN SOLN
0.5000 mg | Freq: Two times a day (BID) | RESPIRATORY_TRACT | Status: DC
  Administered 2012-10-27 – 2012-11-04 (×18): 0.5 mg via RESPIRATORY_TRACT
  Filled 2012-10-26 (×20): qty 2.5

## 2012-10-26 MED ORDER — GABAPENTIN 400 MG PO CAPS
800.0000 mg | ORAL_CAPSULE | Freq: Three times a day (TID) | ORAL | Status: DC
  Administered 2012-10-27 – 2012-11-05 (×11): 800 mg via ORAL
  Filled 2012-10-26 (×34): qty 2

## 2012-10-26 MED ORDER — ASPIRIN EC 81 MG PO TBEC
81.0000 mg | DELAYED_RELEASE_TABLET | Freq: Every day | ORAL | Status: DC
  Administered 2012-10-26 – 2012-10-30 (×5): 81 mg via ORAL
  Filled 2012-10-26 (×7): qty 1

## 2012-10-26 MED ORDER — FUROSEMIDE 10 MG/ML IJ SOLN
40.0000 mg | Freq: Three times a day (TID) | INTRAMUSCULAR | Status: DC
  Administered 2012-10-27 – 2012-10-28 (×5): 40 mg via INTRAVENOUS
  Filled 2012-10-26 (×7): qty 4

## 2012-10-26 MED ORDER — SODIUM CHLORIDE 0.9 % IJ SOLN
3.0000 mL | Freq: Two times a day (BID) | INTRAMUSCULAR | Status: DC
  Administered 2012-10-26 – 2012-10-30 (×5): 3 mL via INTRAVENOUS

## 2012-10-26 MED ORDER — POTASSIUM CHLORIDE 20 MEQ/15ML (10%) PO LIQD
20.0000 meq | Freq: Every morning | ORAL | Status: DC
  Filled 2012-10-26: qty 15

## 2012-10-26 MED ORDER — ENOXAPARIN SODIUM 40 MG/0.4ML ~~LOC~~ SOLN
40.0000 mg | Freq: Every day | SUBCUTANEOUS | Status: DC
  Administered 2012-10-26 – 2012-11-04 (×10): 40 mg via SUBCUTANEOUS
  Filled 2012-10-26 (×12): qty 0.4

## 2012-10-26 MED ORDER — DICLOFENAC SODIUM 1 % TD GEL
2.0000 g | Freq: Four times a day (QID) | TRANSDERMAL | Status: DC | PRN
  Filled 2012-10-26 (×3): qty 100

## 2012-10-26 MED ORDER — ONDANSETRON HCL 4 MG/2ML IJ SOLN: 4.0000 mg | Freq: Four times a day (QID) | INTRAMUSCULAR | Status: DC | PRN

## 2012-10-26 MED ORDER — FERROUS SULFATE 325 (65 FE) MG PO TABS
325.0000 mg | ORAL_TABLET | Freq: Every day | ORAL | Status: DC
  Administered 2012-10-27 – 2012-11-04 (×9): 325 mg via ORAL
  Filled 2012-10-26 (×13): qty 1

## 2012-10-26 MED ORDER — SERTRALINE HCL 100 MG PO TABS
100.0000 mg | ORAL_TABLET | Freq: Every day | ORAL | Status: DC
  Administered 2012-10-26 – 2012-11-04 (×10): 100 mg via ORAL
  Filled 2012-10-26 (×12): qty 1

## 2012-10-26 MED ORDER — IPRATROPIUM BROMIDE 0.02 % IN SOLN
0.5000 mg | RESPIRATORY_TRACT | Status: DC
  Administered 2012-10-26: 0.5 mg via RESPIRATORY_TRACT
  Filled 2012-10-26: qty 2.5

## 2012-10-26 MED ORDER — GEMFIBROZIL 600 MG PO TABS
600.0000 mg | ORAL_TABLET | Freq: Two times a day (BID) | ORAL | Status: DC
  Administered 2012-10-26 – 2012-10-28 (×5): 600 mg via ORAL
  Filled 2012-10-26 (×7): qty 1

## 2012-10-26 MED ORDER — PANTOPRAZOLE SODIUM 40 MG PO TBEC
80.0000 mg | DELAYED_RELEASE_TABLET | Freq: Every day | ORAL | Status: DC
  Administered 2012-10-26 – 2012-10-28 (×3): 80 mg via ORAL
  Filled 2012-10-26 (×4): qty 2

## 2012-10-26 MED ORDER — CALCIUM CARBONATE-VITAMIN D 500-200 MG-UNIT PO TABS
1.0000 | ORAL_TABLET | Freq: Two times a day (BID) | ORAL | Status: DC
  Administered 2012-10-27 – 2012-11-05 (×19): 1 via ORAL
  Filled 2012-10-26 (×23): qty 1

## 2012-10-26 MED ORDER — INSULIN ASPART 100 UNIT/ML ~~LOC~~ SOLN
0.0000 [IU] | Freq: Three times a day (TID) | SUBCUTANEOUS | Status: DC
  Administered 2012-10-27: 2 [IU] via SUBCUTANEOUS
  Administered 2012-10-28 (×2): 3 [IU] via SUBCUTANEOUS
  Administered 2012-10-29 (×2): 5 [IU] via SUBCUTANEOUS
  Administered 2012-10-29 – 2012-10-30 (×2): 3 [IU] via SUBCUTANEOUS

## 2012-10-26 MED ORDER — HYDROXYZINE HCL 25 MG PO TABS
25.0000 mg | ORAL_TABLET | Freq: Every evening | ORAL | Status: DC | PRN
  Administered 2012-10-26 – 2012-11-03 (×2): 25 mg via ORAL
  Filled 2012-10-26 (×3): qty 1

## 2012-10-26 MED ORDER — SODIUM CHLORIDE 0.9 % IV SOLN
250.0000 mL | INTRAVENOUS | Status: DC | PRN
  Administered 2012-10-29: 500 mL via INTRAVENOUS

## 2012-10-26 MED ORDER — LETROZOLE 2.5 MG PO TABS
2.5000 mg | ORAL_TABLET | Freq: Every morning | ORAL | Status: DC
  Administered 2012-10-27 – 2012-10-28 (×2): 2.5 mg via ORAL
  Filled 2012-10-26 (×4): qty 1

## 2012-10-26 MED ORDER — ALBUTEROL SULFATE (5 MG/ML) 0.5% IN NEBU: 2.5000 mg | INHALATION_SOLUTION | RESPIRATORY_TRACT | Status: DC | PRN

## 2012-10-26 MED ORDER — ACETAMINOPHEN 325 MG PO TABS
650.0000 mg | ORAL_TABLET | ORAL | Status: DC | PRN
  Administered 2012-10-27 – 2012-11-01 (×5): 650 mg via ORAL
  Filled 2012-10-26 (×5): qty 2

## 2012-10-26 MED ORDER — ALBUTEROL SULFATE (5 MG/ML) 0.5% IN NEBU
2.5000 mg | INHALATION_SOLUTION | RESPIRATORY_TRACT | Status: DC
  Administered 2012-10-26: 2.5 mg via RESPIRATORY_TRACT
  Filled 2012-10-26: qty 0.5

## 2012-10-26 MED ORDER — LORAZEPAM 1 MG PO TABS
2.0000 mg | ORAL_TABLET | Freq: Every evening | ORAL | Status: DC | PRN
  Administered 2012-10-27 – 2012-11-04 (×9): 2 mg via ORAL
  Filled 2012-10-26 (×10): qty 2

## 2012-10-26 MED ORDER — ALBUTEROL SULFATE (5 MG/ML) 0.5% IN NEBU
2.5000 mg | INHALATION_SOLUTION | Freq: Two times a day (BID) | RESPIRATORY_TRACT | Status: DC
  Administered 2012-10-27 – 2012-11-04 (×18): 2.5 mg via RESPIRATORY_TRACT
  Filled 2012-10-26 (×20): qty 0.5

## 2012-10-26 MED ORDER — BUDESONIDE 0.25 MG/2ML IN SUSP
0.2500 mg | Freq: Two times a day (BID) | RESPIRATORY_TRACT | Status: DC
  Administered 2012-10-26 – 2012-11-04 (×18): 0.25 mg via RESPIRATORY_TRACT
  Filled 2012-10-26 (×25): qty 2

## 2012-10-26 MED ORDER — FUROSEMIDE 10 MG/ML IJ SOLN
80.0000 mg | Freq: Once | INTRAMUSCULAR | Status: AC
  Administered 2012-10-26: 80 mg via INTRAVENOUS
  Filled 2012-10-26 (×2): qty 4

## 2012-10-26 MED ORDER — TRAMADOL HCL 50 MG PO TABS
50.0000 mg | ORAL_TABLET | Freq: Once | ORAL | Status: AC
  Administered 2012-10-26: 50 mg via ORAL
  Filled 2012-10-26: qty 1

## 2012-10-26 MED ORDER — NICOTINE 21 MG/24HR TD PT24
21.0000 mg | MEDICATED_PATCH | Freq: Every day | TRANSDERMAL | Status: DC
  Administered 2012-10-26 – 2012-11-05 (×11): 21 mg via TRANSDERMAL
  Filled 2012-10-26 (×12): qty 1

## 2012-10-26 MED ORDER — LISINOPRIL 2.5 MG PO TABS
2.5000 mg | ORAL_TABLET | Freq: Every day | ORAL | Status: DC
  Administered 2012-10-26 – 2012-10-28 (×3): 2.5 mg via ORAL
  Filled 2012-10-26 (×3): qty 1

## 2012-10-26 NOTE — Progress Notes (Addendum)
5 beats of V-tach noted while receiving breathing treatment. Pt denies CP/SOB/palpitations, asymptomatic. VSS, remains NSR/ST. Lenny Pastel, NP made aware, will monitor. Pt instructed to notify RN if she develops any symptoms.
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
## 2012-10-26 NOTE — ED Notes (Signed)
MD at bedside. 

## 2012-10-26 NOTE — Progress Notes (Signed)
VASCULAR LAB PRELIMINARY  PRELIMINARY  PRELIMINARY  PRELIMINARY  Bilateral lower extremity venous duplex completed.    Preliminary report:  Bilateral:  No evidence of DVT, superficial thrombosis, or Baker's Cyst. Doppler waveforms and signals were pulsatile throughout bilaterally consistent with congestive heart failure or fluid overload.   Lewin Pellow, RVS 10/26/2012, 5:44 PM

## 2012-10-26 NOTE — ED Notes (Signed)
Unsuccessful IV attempt to L forearm x1

## 2012-10-26 NOTE — ED Notes (Signed)
Pt reports abd pain and distention, bilateral feet swelling, right foot more swollen than left, and SOB x3 weeks. Was seen here, gave breathing treatment and told to follow up with pulmonary - pulmonary MD told pt her symptoms are not associated and advised her to follow up with GI. Pt was treated by gastro MD on Thursday, who told pt her stomach has air in it and liver enzymes elevated. Pt has an apt Tues. Sept 2nd at [facility] imaging for abdominal ultrasound and xray. Pt reports her SOB has gotten worse so she came into ED

## 2012-10-26 NOTE — ED Notes (Signed)
Attempted to call report. RN on 4th floor unable to take report at this time.

## 2012-10-27 ENCOUNTER — Inpatient Hospital Stay (HOSPITAL_COMMUNITY): Payer: Medicare Other

## 2012-10-27 DIAGNOSIS — IMO0001 Reserved for inherently not codable concepts without codable children: Secondary | ICD-10-CM

## 2012-10-27 DIAGNOSIS — I369 Nonrheumatic tricuspid valve disorder, unspecified: Secondary | ICD-10-CM

## 2012-10-27 DIAGNOSIS — F17200 Nicotine dependence, unspecified, uncomplicated: Secondary | ICD-10-CM

## 2012-10-27 LAB — BASIC METABOLIC PANEL
Calcium: 9.3 mg/dL (ref 8.4–10.5)
Creatinine, Ser: 0.82 mg/dL (ref 0.50–1.10)
GFR calc Af Amer: >90 mL/min (ref >90)

## 2012-10-27 LAB — TSH: TSH: 3.327 u[IU]/mL (ref 0.350–4.500)

## 2012-10-27 LAB — GLUCOSE, CAPILLARY
Glucose-Capillary: 163 mg/dL — ABNORMAL HIGH (ref 70–99)
Glucose-Capillary: 157 mg/dL — ABNORMAL HIGH (ref 70–99)

## 2012-10-27 MED ORDER — POTASSIUM CHLORIDE 20 MEQ/15ML (10%) PO LIQD
40.0000 meq | Freq: Two times a day (BID) | ORAL | Status: DC
  Administered 2012-10-27 – 2012-10-28 (×4): 40 meq via ORAL
  Filled 2012-10-27 (×6): qty 30

## 2012-10-27 MED ORDER — PERFLUTREN LIPID MICROSPHERE
1.0000 mL | INTRAVENOUS | Status: DC | PRN
  Filled 2012-10-27: qty 10

## 2012-10-27 NOTE — Care Management Note (Addendum)
    Page 1 of 2   11/04/2012     2:31:37 PM   CARE MANAGEMENT NOTE 11/04/2012  Patient:  Emily [name], Emily [name]   Account Number:  [id number]  Date Initiated:  10/27/2012  Documentation initiated by:  [facility]  Subjective/Objective Assessment:   52 year old female admitted with CHF exacerbation.   In-house referral  Editor, commissioning  CM consult      [facility] Choice  HOME HEALTH   Choice offered to / List presented to:  C-1 Patient        HH arranged  HH-1 RN  HH-10 DISEASE MANAGEMENT  HH-6 SOCIAL WORKER      HH agency  [facility]   Per UR Regulation:  Reviewed for med. necessity/level of care/duration of stay  Comments:  11/04/12 1419 Takota Cahalan RN MSN BSN CCM Pt previously agreed to home health RN - per [facility] liaison, pt will be in heart failure telemonitoring program. Advised pt to take medical bills to DSS to see if she qualifies for short-term MCD, as she was just over limit when she applied previously.  SW will also visit to facilitate referrals to community resources, as pt states she is unable to afford copays on some meds.  9/5  1547 debbie dowell rn,bsn spoke w pt. she has ins but concerned about hosp bill. have req financial co speak w pt. [place] states md not sure if will need iv at dc or not. no pref to hhc agencies. ref to donna for hhrn for chf and soc work. pt has trouble buying meds even if they have 30.00 copay she does not buy them. the more of her meds on 4.00 list she will be able to be more compliant. tent hhc ref made but will need md orders at disch.  10/27/12 KATHY MAHABIR RN,BSN NCM WEEKEND [phone] PATIENT STATES HER CURRENT PCP IS UNDER [place] BUT UNSURE OF NAME.WOULD RECOMMEND HHRN-CHF PROTOCAL.MAY BENEFIT FROM PT/OT EVAL WHEN MD FEEL APPROPRIATE.

## 2012-10-27 NOTE — Progress Notes (Signed)
[facility] PROGRESS NOTE  Emily [name] KVQ:[id number] DOB: 21-May-1960 DOA: 10/26/2012 PCP: [name], MD  Assessment/Plan:  1. CHF exacerbation of CHF,  - ECHO pending -continue diuresis with IV lasix 40mg  q8 -KCL supplementation -negative 2.4L so afr -continue BB and ACE -CHF education  2. COPD,  -currently stable, tobacco cessation counseleing - Duonebs and start pulmicort for now   3. Abdominal pain and distension, may have some ascites (seen previously). -improved, tolerating diet - Abd [place] with paracentesis today  4. HLD  - Continue fibrate   5. DM  - A1c 9.6 - HOld oral meds , SSI   6. Hyponatremia, likely due to CHF  - TSH normal - continue Diuresis   Proph: lovenox  Code Status: full  Family Communication: none at bedside Disposition Plan: keep on telemetry       HPI/Subjective: Feels better, breathing better, no more abd pain N/V  Objective: Filed Vitals:   10/27/12 1454  BP: 95/60  Pulse:   Temp:   Resp:     Intake/Output Summary (Last 24 hours) at 10/27/12 1626 Last data filed at 10/27/12 1326  Gross per 24 hour  Intake    360 ml  Output   3750 ml  Net  -3390 ml   Filed Weights   10/26/12 1427 10/26/12 2030 10/27/12 0504  Weight: 79.833 kg (176 lb) 80.8 kg (178 lb 2.1 oz) 79 kg (174 lb 2.6 oz)    Exam:   General:  AAOx3  Cardiovascular: S1S2/RRR  Respiratory: fine basilar crackles  Abdomen: soft, Nt, ND, Fluid thrill present  Musculoskeletal: no edema c/c  Data Reviewed: Basic Metabolic Panel:  Recent Labs Lab 10/26/12 1700 10/26/12 2125 10/27/12 0235  NA 130*  --  130*  K 3.9  --  2.9*  CL 94*  --  90*  CO2 25  --  24  GLUCOSE 191*  --  192*  BUN 14  --  17  CREATININE 0.62  --  0.82  CALCIUM 9.4  --  9.3  MG  --  1.5  --    Liver Function Tests:  Recent Labs Lab 10/26/12 1700  AST 35  ALT 16  ALKPHOS 44  BILITOT 0.9  PROT 6.9  ALBUMIN 3.4*    Recent Labs Lab 10/26/12 1700   LIPASE 18   No results found for this basename: AMMONIA,  in the last 168 hours CBC:  Recent Labs Lab 10/26/12 1700  WBC 10.2  NEUTROABS 6.4  HGB 12.6  HCT 38.6  MCV 81.8  PLT 282   Cardiac Enzymes:  Recent Labs Lab 10/26/12 1700 10/26/12 2125 10/27/12 0235  TROPONINI <0.30 <0.30 <0.30   BNP (last 3 results)  Recent Labs  10/26/12 1700  PROBNP 3089.0*   CBG:  Recent Labs Lab 10/26/12 2016 10/27/12 0726 10/27/12 1241  GLUCAP 154* 132* 163*    No results found for this or any previous visit (from the past 240 hour(s)).   Studies: Dg Chest 2 View  10/26/2012   *RADIOLOGY REPORT*  Clinical Data: Shortness of breath and lower extremity edema.  CHEST - 2 VIEW  Comparison: 09/26/2012  Findings: It is suspected that there is mild pulmonary interstitial edema present.  This is at least partially chronic and also superimposed on a component of chronic interstitial lung disease when reviewing multiple prior chest radiographs.  There may be a trace amount of pleural fluid on the right.  Heart size is at the upper limits of normal.  The  bony thorax is unremarkable.  IMPRESSION: Suspected mild interstitial edema superimposed on chronic lung disease.   Original Report Authenticated By: Irish Lack, M.D.   US Abdomen Limited  10/27/2012   *RADIOLOGY REPORT*  Clinical Data: Abdominal distension  LIMITED ABDOMINAL ULTRASOUND  Comparison:  None.  Findings: Limited sonographic evaluation of the abdomen reveals only minimal amount of ascites.  No sizable collection to allow for safe paracentesis is noted.   Original Report Authenticated By: Alcide Clever, M.D.    Scheduled Meds: . ipratropium  0.5 mg Nebulization BID   And  . albuterol  2.5 mg Nebulization BID  . aspirin EC  81 mg Oral Daily  . budesonide (PULMICORT) nebulizer solution  0.25 mg Nebulization BID  . calcium-vitamin D  1 tablet Oral BID  . enoxaparin (LOVENOX) injection  40 mg Subcutaneous QHS  . ferrous sulfate   325 mg Oral QHS  . furosemide  40 mg Intravenous TID  . gabapentin  800 mg Oral TID  . gemfibrozil  600 mg Oral BID  . insulin aspart  0-5 Units Subcutaneous QHS  . insulin aspart  0-9 Units Subcutaneous TID WC  . letrozole  2.5 mg Oral q morning - 10a  . lisinopril  2.5 mg Oral Daily  . metoprolol tartrate  12.5 mg Oral Q12H  . nicotine  21 mg Transdermal Daily  . pantoprazole  80 mg Oral Daily  . potassium chloride  40 mEq Oral BID  . sertraline  100 mg Oral QHS  . sodium chloride  3 mL Intravenous Q12H   Continuous Infusions:   Principal Problem:   Acute exacerbation of CHF (congestive heart failure) Active Problems:   ADENOCARCINOMA, BREAST   DM   DM type 2 (diabetes mellitus, type 2)   Dyspnea   GERD (gastroesophageal reflux disease)   Cigarette nicotine dependence, uncomplicated   Arthritis   Iron deficiency anemia   Dyslipidemia   Insomnia   Abdominal pain    Time spent:    [facility]  [facility] Pager [phone]. If 7PM-7AM, please contact night-coverage at [email or web address], password [facility] 10/27/2012, 4:26 PM  LOS: 1 day

## 2012-10-27 NOTE — Progress Notes (Signed)
  Echocardiogram 2D Echocardiogram has been performed.  Estelle Grumbles 10/27/2012, 11:14 AM

## 2012-10-28 DIAGNOSIS — I5021 Acute systolic (congestive) heart failure: Secondary | ICD-10-CM

## 2012-10-28 DIAGNOSIS — E119 Type 2 diabetes mellitus without complications: Secondary | ICD-10-CM

## 2012-10-28 LAB — GLUCOSE, CAPILLARY
Glucose-Capillary: 215 mg/dL — ABNORMAL HIGH (ref 70–99)
Glucose-Capillary: 231 mg/dL — ABNORMAL HIGH (ref 70–99)
Glucose-Capillary: 289 mg/dL — ABNORMAL HIGH (ref 70–99)

## 2012-10-28 LAB — BASIC METABOLIC PANEL
CO2: 34 mEq/L — ABNORMAL HIGH (ref 19–32)
Calcium: 8.7 mg/dL (ref 8.4–10.5)
Chloride: 89 mEq/L — ABNORMAL LOW (ref 96–112)
GFR calc Af Amer: 88 mL/min — ABNORMAL LOW (ref >90)
Sodium: 135 mEq/L (ref 135–145)

## 2012-10-28 MED ORDER — POTASSIUM CHLORIDE CRYS ER 20 MEQ PO TBCR
60.0000 meq | EXTENDED_RELEASE_TABLET | Freq: Once | ORAL | Status: DC
  Filled 2012-10-28: qty 3

## 2012-10-28 MED ORDER — POTASSIUM CHLORIDE 20 MEQ/15ML (10%) PO LIQD
60.0000 meq | ORAL | Status: AC
  Administered 2012-10-28: 60 meq via ORAL
  Filled 2012-10-28: qty 45

## 2012-10-28 NOTE — Progress Notes (Addendum)
NOtified by RN about drop in BP to 90/60, will stop lisinopril and Lasix till this improves, if becomes more symptomatic will give small bolus x1 May be related to poor Output state from CHF Cards eval pending, may need Dobutamine gtt and diuresis  Zannie Cove, MD [phone]

## 2012-10-28 NOTE — Progress Notes (Addendum)
[facility] PROGRESS NOTE  Emily [name] ZOX:[id number] DOB: 11/22/1960 DOA: 10/26/2012 PCP: [name], MD  Assessment/Plan:  1. Acute systolic CHF - ECHO with EF 20-25% with diffuse hypokinesis - continue diuresis with IV lasix 40mg  q8 [facility] Cardiology consult requested d/w Dr.Smith, ? Need for ischemic eval - KCL supplementation - Negative 4L so far - continue BB and ACE - CHF education  2. COPD,  -currently stable, tobacco cessation counseleing - Duonebs and pulmicort    3. Abdominal pain and distension,  -due to volume overload and abd wall edema -improved, tolerating diet - Abd USG without acsites  4. HLD  - Continue fibrate   5. DM  - A1c 9.6 - HOld oral meds , SSI   6. Hyponatremia, likely due to CHF  - TSH normal - continue Diuresis   Proph: lovenox  Code Status: full  Family Communication: none at bedside Disposition Plan: keep on telemetry       HPI/Subjective: Feels better, breathing better, with abd wall tightness, no N/V  Objective: Filed Vitals:   10/28/12 0519  BP: 102/63  Pulse: 99  Temp: 98.3 F (36.8 C)  Resp: 18    Intake/Output Summary (Last 24 hours) at 10/28/12 0844 Last data filed at 10/28/12 0400  Gross per 24 hour  Intake    600 ml  Output   1850 ml  Net  -1250 ml   Filed Weights   10/26/12 2030 10/27/12 0504 10/28/12 0500  Weight: 80.8 kg (178 lb 2.1 oz) 79 kg (174 lb 2.6 oz) 77.2 kg (170 lb 3.1 oz)    Exam:   General:  AAOx3  Cardiovascular: S1S2/RRR  Respiratory: fine basilar crackles  Abdomen: soft, Nt, ND, no fluid thrill, abd wall edema noted Musculoskeletal: 2 plus edema b/l Data Reviewed: Basic Metabolic Panel:  Recent Labs Lab 10/26/12 1700 10/26/12 2125 10/27/12 0235 10/28/12 0440  NA 130*  --  130* 135  K 3.9  --  2.9* 2.8*  CL 94*  --  90* 89*  CO2 25  --  24 34*  GLUCOSE 191*  --  192* 122*  BUN 14  --  17 16  CREATININE 0.62  --  0.82 0.85  CALCIUM 9.4  --  9.3 8.7   MG  --  1.5  --   --    Liver Function Tests:  Recent Labs Lab 10/26/12 1700  AST 35  ALT 16  ALKPHOS 44  BILITOT 0.9  PROT 6.9  ALBUMIN 3.4*    Recent Labs Lab 10/26/12 1700  LIPASE 18   No results found for this basename: AMMONIA,  in the last 168 hours CBC:  Recent Labs Lab 10/26/12 1700  WBC 10.2  NEUTROABS 6.4  HGB 12.6  HCT 38.6  MCV 81.8  PLT 282   Cardiac Enzymes:  Recent Labs Lab 10/26/12 1700 10/26/12 2125 10/27/12 0235  TROPONINI <0.30 <0.30 <0.30   BNP (last 3 results)  Recent Labs  10/26/12 1700  PROBNP 3089.0*   CBG:  Recent Labs Lab 10/27/12 0726 10/27/12 1241 10/27/12 1710 10/27/12 2135 10/28/12 0735  GLUCAP 132* 163* 157* 125* 111*    No results found for this or any previous visit (from the past 240 hour(s)).   Studies: Dg Chest 2 View  10/26/2012   *RADIOLOGY REPORT*  Clinical Data: Shortness of breath and lower extremity edema.  CHEST - 2 VIEW  Comparison: 09/26/2012  Findings: It is suspected that there is mild pulmonary interstitial edema present.  This is at least partially chronic and also superimposed on a component of chronic interstitial lung disease when reviewing multiple prior chest radiographs.  There may be a trace amount of pleural fluid on the right.  Heart size is at the upper limits of normal.  The bony thorax is unremarkable.  IMPRESSION: Suspected mild interstitial edema superimposed on chronic lung disease.   Original Report Authenticated By: Irish Lack, M.D.   US Abdomen Limited  10/27/2012   *RADIOLOGY REPORT*  Clinical Data: Abdominal distension  LIMITED ABDOMINAL ULTRASOUND  Comparison:  None.  Findings: Limited sonographic evaluation of the abdomen reveals only minimal amount of ascites.  No sizable collection to allow for safe paracentesis is noted.   Original Report Authenticated By: Alcide Clever, M.D.    Scheduled Meds: . ipratropium  0.5 mg Nebulization BID   And  . albuterol  2.5 mg  Nebulization BID  . aspirin EC  81 mg Oral Daily  . budesonide (PULMICORT) nebulizer solution  0.25 mg Nebulization BID  . calcium-vitamin D  1 tablet Oral BID  . enoxaparin (LOVENOX) injection  40 mg Subcutaneous QHS  . ferrous sulfate  325 mg Oral QHS  . furosemide  40 mg Intravenous TID  . gabapentin  800 mg Oral TID  . gemfibrozil  600 mg Oral BID  . insulin aspart  0-5 Units Subcutaneous QHS  . insulin aspart  0-9 Units Subcutaneous TID WC  . letrozole  2.5 mg Oral q morning - 10a  . lisinopril  2.5 mg Oral Daily  . metoprolol tartrate  12.5 mg Oral Q12H  . nicotine  21 mg Transdermal Daily  . pantoprazole  80 mg Oral Daily  . potassium chloride  40 mEq Oral BID  . potassium chloride  60 mEq Oral Once  . sertraline  100 mg Oral QHS  . sodium chloride  3 mL Intravenous Q12H   Continuous Infusions:   Principal Problem:   Acute exacerbation of CHF (congestive heart failure) Active Problems:   ADENOCARCINOMA, BREAST   DM   DM type 2 (diabetes mellitus, type 2)   Dyspnea   GERD (gastroesophageal reflux disease)   Cigarette nicotine dependence, uncomplicated   Arthritis   Iron deficiency anemia   Dyslipidemia   Insomnia   Abdominal pain    Time spent:    [facility]  [facility] Pager [phone]. If 7PM-7AM, please contact night-coverage at [email or web address], password [facility] 10/28/2012, 8:44 AM  LOS: 2 days
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
## 2012-10-28 NOTE — Progress Notes (Signed)
Pt with c/o dizziness. BP checked automatic 78/49. Pulse 87. Manuel BP 90/60. No other acute changes noted with Pt's assessment. MD given update via phone and will recheck BP in one Hour and monitor closely for any further acute changes.

## 2012-10-28 NOTE — Progress Notes (Signed)
MD via phone given update BP 80/60 pulse 80. Pt remains with c/o dizziness but no other acute changes noted at this time. Will continue to monitor Pt closely and notify MD for acute changes.

## 2012-10-28 NOTE — Progress Notes (Signed)
Pt resting quietly at this this time. BP 80/60 pulse 78. Pt is reporting decreased dizziness at this time. MD updated via phone. Maintain current plan of care and monitor Pt closely for any acute changes in assessment.

## 2012-10-29 ENCOUNTER — Inpatient Hospital Stay (HOSPITAL_COMMUNITY): Payer: Medicare Other

## 2012-10-29 ENCOUNTER — Other Ambulatory Visit: Payer: Medicare Other

## 2012-10-29 LAB — BASIC METABOLIC PANEL
BUN: 27 mg/dL — ABNORMAL HIGH (ref 6–23)
BUN: 31 mg/dL — ABNORMAL HIGH (ref 6–23)
Calcium: 8.3 mg/dL — ABNORMAL LOW (ref 8.4–10.5)
Chloride: 87 mEq/L — ABNORMAL LOW (ref 96–112)
GFR calc Af Amer: 43 mL/min — ABNORMAL LOW (ref >90)
GFR calc Af Amer: 48 mL/min — ABNORMAL LOW (ref >90)
GFR calc non Af Amer: 37 mL/min — ABNORMAL LOW (ref >90)
GFR calc non Af Amer: 41 mL/min — ABNORMAL LOW (ref >90)
Glucose, Bld: 285 mg/dL — ABNORMAL HIGH (ref 70–99)
Potassium: 5.3 mEq/L — ABNORMAL HIGH (ref 3.5–5.1)
Potassium: 3.3 mEq/L — ABNORMAL LOW (ref 3.5–5.1)
Sodium: 127 mEq/L — ABNORMAL LOW (ref 135–145)
Sodium: 131 mEq/L — ABNORMAL LOW (ref 135–145)

## 2012-10-29 LAB — CARBOXYHEMOGLOBIN
Carboxyhemoglobin: 1.6 % — ABNORMAL HIGH (ref 0.5–1.5)
O2 Saturation: 47.1 %
O2 Saturation: 71.9 %
Total hemoglobin: 11.1 g/dL — ABNORMAL LOW (ref 12.0–16.0)

## 2012-10-29 LAB — GLUCOSE, CAPILLARY
Glucose-Capillary: 227 mg/dL — ABNORMAL HIGH (ref 70–99)
Glucose-Capillary: 262 mg/dL — ABNORMAL HIGH (ref 70–99)

## 2012-10-29 LAB — MRSA PCR SCREENING: MRSA by PCR: NEGATIVE

## 2012-10-29 MED ORDER — SODIUM CHLORIDE 0.9 % IV SOLN
INTRAVENOUS | Status: DC
  Administered 2012-10-29: 09:00:00 via INTRAVENOUS

## 2012-10-29 MED ORDER — SODIUM CHLORIDE 0.9 % IV BOLUS (SEPSIS)
250.0000 mL | Freq: Once | INTRAVENOUS | Status: AC
  Administered 2012-10-29: 250 mL via INTRAVENOUS

## 2012-10-29 MED ORDER — POTASSIUM CHLORIDE CRYS ER 20 MEQ PO TBCR: 40.0000 meq | EXTENDED_RELEASE_TABLET | Freq: Once | ORAL | Status: AC

## 2012-10-29 MED ORDER — POTASSIUM CHLORIDE CRYS ER 20 MEQ PO TBCR
EXTENDED_RELEASE_TABLET | ORAL | Status: AC
  Administered 2012-10-29: 40 meq
  Filled 2012-10-29: qty 2

## 2012-10-29 MED ORDER — PANTOPRAZOLE SODIUM 40 MG PO TBEC
40.0000 mg | DELAYED_RELEASE_TABLET | Freq: Every day | ORAL | Status: DC
  Administered 2012-10-29 – 2012-11-05 (×8): 40 mg via ORAL
  Filled 2012-10-29 (×8): qty 1

## 2012-10-29 MED ORDER — FUROSEMIDE 10 MG/ML IJ SOLN
10.0000 mg/h | INTRAVENOUS | Status: DC
  Administered 2012-10-29: 10 mg/h via INTRAVENOUS
  Filled 2012-10-29 (×3): qty 25

## 2012-10-29 MED ORDER — DOBUTAMINE IN D5W 4-5 MG/ML-% IV SOLN
2.5000 ug/kg/min | INTRAVENOUS | Status: DC
  Filled 2012-10-29 (×2): qty 250

## 2012-10-29 MED ORDER — DIGOXIN 125 MCG PO TABS
0.1250 mg | ORAL_TABLET | Freq: Every day | ORAL | Status: DC
  Administered 2012-10-29 – 2012-11-05 (×8): 0.125 mg via ORAL
  Filled 2012-10-29 (×8): qty 1

## 2012-10-29 MED ORDER — INSULIN GLARGINE 100 UNIT/ML ~~LOC~~ SOLN
10.0000 [IU] | Freq: Every day | SUBCUTANEOUS | Status: DC
  Administered 2012-10-29 – 2012-10-31 (×3): 10 [IU] via SUBCUTANEOUS
  Filled 2012-10-29 (×4): qty 0.1

## 2012-10-29 MED ORDER — GUAIFENESIN 100 MG/5ML PO SYRP
200.0000 mg | ORAL_SOLUTION | ORAL | Status: DC | PRN
  Administered 2012-10-30: 200 mg via ORAL
  Filled 2012-10-29: qty 10

## 2012-10-29 MED ORDER — SODIUM CHLORIDE 0.9 % IV SOLN
INTRAVENOUS | Status: DC
  Administered 2012-10-29 – 2012-11-04 (×2): via INTRAVENOUS

## 2012-10-29 MED ORDER — MILRINONE IN DEXTROSE 20 MG/100ML IV SOLN
0.2500 ug/kg/min | INTRAVENOUS | Status: DC
  Administered 2012-10-29: 0.125 ug/kg/min via INTRAVENOUS
  Administered 2012-10-29 – 2012-11-03 (×6): 0.25 ug/kg/min via INTRAVENOUS
  Filled 2012-10-29 (×7): qty 100

## 2012-10-29 NOTE — Progress Notes (Signed)
Dr. Elease Hashimoto notified of pt blood pressure of 87/31 with mean of 50.  Blood pressure has been creeping down for past 30 minutes.  Dr. Gala Romney wants mean pressure at least 59-60.  Dr. Elease Hashimoto to come evaluate pt.  Report given to oncoming RN who will continue to monitor pt closely.

## 2012-10-29 NOTE — Progress Notes (Signed)
Thanks to Dr. Gala Romney for magically appearing early this morning to help this nice lady!  I will follow the lead of the heart failure team with reference to management.  Ischemic evaluation when the patient is more stable.

## 2012-10-29 NOTE — Progress Notes (Addendum)
Pt 's BP dropped and c/o lightheadedness. On call notified with new order: 250 ml NS bolus. Will monitor closely.   BP 74/53 via machine, 70/45-manually.

## 2012-10-29 NOTE — Procedures (Signed)
Central Venous Catheter Insertion Procedure Note Emily [name] [id number] Jul 29, 1960  Procedure: Insertion of Pulmonary Artery Catheter Indications: Assessment of intravascular volume  Procedure Details Consent: Risks of procedure as well as the alternatives and risks of each were explained to the (patient/caregiver).  Consent for procedure obtained. Time Out: Verified patient identification, verified procedure, site/side was marked, verified correct patient position, special equipment/implants available, medications/allergies/relevent history reviewed, required imaging and test results available.  Performed  Maximum sterile technique was used including antiseptics, cap, gloves, gown, hand hygiene, mask and sheet. Skin prep: Chlorhexidine; local anesthetic administered A sheath was placed in the right internal jugular vein using the Seldinger technique. Through the sheath a PA catheter was navigated into the PA using hemodynamic tracing guidance.  Initial CVP 30 PCWP ~25 PA sat 47%  Evaluation Blood flow good Complications: No apparent complications Patient did tolerate procedure well. Chest X-ray ordered to verify placement.  CXR: normal.  Emily [name] 10/29/2012, 7:29 AM

## 2012-10-29 NOTE — Progress Notes (Addendum)
  Contacted by [facility].  52 y/o with probable chemotherapy induced CM EF 20-25%. Now with symptoms of low output HF. SBP in 70s. Cool extremities and poor u/o. Has not responded to ~1L NS IVF today.  Will need to transfer to [facility] ICU for [facility] and probable initiation of inotropes.   The HF team will see her when she arrives.  Daniel Bensimhon,MD 3:35 AM

## 2012-10-29 NOTE — Progress Notes (Signed)
Pt transported via [facility] at this time. Personal belongings and IV Dobutamine sent with patient.

## 2012-10-29 NOTE — Progress Notes (Signed)
2nd bolus of 250 ml NS  Completed-BP low 70/40. On call K. Schorr came to see patient. New order to insert foley  Received and a plan to transfer to step down ICU. Pt still c/o  10/29/12 0321  Vitals  BP ! 70/40 mmHg  BP Location Left arm  BP Method Manual  Patient Position, if appropriate Lying   dizziness/lightheadedness at this time. Monitored closely.

## 2012-10-29 NOTE — Progress Notes (Signed)
Inpatient Diabetes Program Recommendations  AACE/ADA: New Consensus Statement on Inpatient Glycemic Control (2013)  Target Ranges:  Prepandial:   less than 140 mg/dL      Peak postprandial:   less than 180 mg/dL (1-2 hours)      Critically ill patients:  140 - 180 mg/dL  Results for DMYA, LONG (MRN [id number]) as of 10/29/2012 12:02  Ref. Range 10/28/2012 07:35 10/28/2012 12:00 10/28/2012 17:24 10/28/2012 21:35 10/29/2012 08:10  Glucose-Capillary Latest Range: 70-99 mg/dL 409 (H) 811 (H) 914 (H) 289 (H) 227 (H)   Inpatient Diabetes Program Recommendations Insulin - Basal: consider adding Levemir 10 units once daily Thank you  Piedad Climes BSN, RN,CDE Inpatient Diabetes Coordinator [phone] (team pager)
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
## 2012-10-29 NOTE — Progress Notes (Signed)
Received order to transfer patient to [facility] under the service of Dr. Milas Kocher. Report called to receiving RN Kelby Aline Ande and carelink staff Baldemar Lenis. Patient wanted to call her family members herself-called her brother Haydee Salter and informed him about the trasfer. Awaiting carelink arrival.

## 2012-10-29 NOTE — Progress Notes (Signed)
[facility] PROGRESS NOTE  Emily [name] ZOX:[id number] DOB: 23-Jan-1961 DOA: 10/26/2012 PCP: [name], MD  Brief narrative  Ms Houchins is a 52 y/o female admitted 10/26/12 for Acute CHF (New onset)the patient has a PMHx of T2DM with peripheral neuropathy, breast cancer s/p lumpectomy, XRT, and chemotherapy, COPD with ongoing tobacco use, GERD, pancreatitis, depression who presented to ED w/ c/o with progressive SOB. T - she found to have acute CHF, BNP on admission was 3089, CXR c/w pulmonary edema. Echo from 10/26/12 revealed EF of 20-25% w/ diffuse hypokinesis. CE have remained negative. -She has diuresed about 8 pounds since admission. Seen by Dr. Katrinka Blazing and felt to have low output HF.Started digoxin. Cath recommended to further evaluate and exclude ischemic HD. Trop neg. pBNP 3089  Throughout the evening 9/1 has been hypotensive with SBP 70-80 range. Poor urine output and cool extremities. Given several 250cc boluses by primary team without any improvement and recommended transfer to [facility] for [facility] placement and probable initiation of inotropes.     Assessment/Plan:  1. Acute decompensated systolic HF, cardiogenic shock ? due to adriamycin toxicity --EF 20-25%; I/O neg 4L; Initial CVP 30; PCWP ~25; PA sat 47% -on IV lasix drip; + milrinone  -cont dig, not on BB or ACE yet due to Low BP; start when more euvolemic  -may need LHC  2. DM2;  A1c 9.6; home metformin+gli[pize  -d/c metformin due to AKI -start lantus 10 units QHS; +ISS   3. AKI, hypo Na likely due to cardiogenic shock +/- diuretics  -close monitor renal function on diuresis   4. COPD, smoker  -currently stable, tobacco cessation counseleing  - Duonebs and pulmicort   5. Abdominal pain and distension,  -due to volume overload and abd wall edema  -improved, tolerating diet  - Abd USG without acsites   6. Breast CA  --status post Adriamycin/Cytoxan/Taxol/lumpectomy/radiation, 2009     Code Status:  full  Family Communication: friend at the bedside, niece  Disposition Plan: pend work up    Consultants:  Dr. Gala Romney   Procedures:  [name] placement 9/2  Antibiotics: No   HPI/Subjective: Patient is feeling better; SOB improved   Objective: Filed Vitals:   10/29/12 1330  BP:   Pulse: 84  Temp: 96.8 F (36 C)  Resp: 21    Intake/Output Summary (Last 24 hours) at 10/29/12 1517 Last data filed at 10/29/12 1400  Gross per 24 hour  Intake 839.94 ml  Output    801 ml  Net  38.94 ml   Filed Weights   10/27/12 0504 10/28/12 0500 10/29/12 0620  Weight: 174 lb 2.6 oz (79 kg) 170 lb 3.1 oz (77.2 kg) 175 lb 0.7 oz (79.4 kg)    Exam:   General:  Alert, oriented   Cardiovascular: S1, S2  Respiratory: crackles in LL   Abdomen: soft, NT, +BS   Musculoskeletal: LE edema present    Data Reviewed: Basic Metabolic Panel:  Recent Labs Lab 10/26/12 1700 10/26/12 2125 10/27/12 0235 10/28/12 0440 10/29/12 0450  NA 130*  --  130* 135 127*  K 3.9  --  2.9* 2.8* 5.3*  CL 94*  --  90* 89* 87*  CO2 25  --  24 34* 26  GLUCOSE 191*  --  192* 122* 245*  BUN 14  --  17 16 27*  CREATININE 0.62  --  0.82 0.85 1.53*  CALCIUM 9.4  --  9.3 8.7 8.9  MG  --  1.5  --   --   --  Liver Function Tests:  Recent Labs Lab 10/26/12 1700  AST 35  ALT 16  ALKPHOS 44  BILITOT 0.9  PROT 6.9  ALBUMIN 3.4*    Recent Labs Lab 10/26/12 1700  LIPASE 18   No results found for this basename: AMMONIA,  in the last 168 hours CBC:  Recent Labs Lab 10/26/12 1700  WBC 10.2  NEUTROABS 6.4  HGB 12.6  HCT 38.6  MCV 81.8  PLT 282   Cardiac Enzymes:  Recent Labs Lab 10/26/12 1700 10/26/12 2125 10/27/12 0235  TROPONINI <0.30 <0.30 <0.30   BNP (last 3 results)  Recent Labs  10/26/12 1700  PROBNP 3089.0*   CBG:  Recent Labs Lab 10/28/12 0735 10/28/12 1200 10/28/12 1724 10/28/12 2135 10/29/12 0810  GLUCAP 111* 215* 231* 289* 227*    Recent Results (from  the past 240 hour(s))  MRSA PCR SCREENING     Status: None   Collection Time    10/29/12 12:40 PM      Result Value Range Status   MRSA by PCR NEGATIVE  NEGATIVE Final   Comment:            The GeneXpert MRSA Assay (FDA     approved for NASAL specimens     only), is one component of a     comprehensive MRSA colonization     surveillance program. It is not     intended to diagnose MRSA     infection nor to guide or     monitor treatment for     MRSA infections.     Studies: Dg Chest Port 1 View  10/29/2012   *RADIOLOGY REPORT*  Clinical Data: Swan-Ganz catheter placement.  PORTABLE CHEST - 1 VIEW  Comparison: PA and lateral chest 10/26/2012.  Findings: The patient has a new right IJ approach Swan-Ganz catheter.  The tip of the catheter is in the proximal most descending interlobar pulmonary artery on the right.  There is no pneumothorax.  Lungs are clear.  Tiny right pleural effusion is noted.  There is cardiomegaly.  No pulmonary edema.  IMPRESSION:  1.  Tip of right IJ catheter is in the proximal most descending interlobar pulmonary artery on the right.  Negative for pneumothorax. 2.  Trace right pleural effusion.   Original Report Authenticated By: Holley Dexter, M.D.    Scheduled Meds: . ipratropium  0.5 mg Nebulization BID   And  . albuterol  2.5 mg Nebulization BID  . aspirin EC  81 mg Oral Daily  . budesonide (PULMICORT) nebulizer solution  0.25 mg Nebulization BID  . calcium-vitamin D  1 tablet Oral BID  . digoxin  0.125 mg Oral Daily  . enoxaparin (LOVENOX) injection  40 mg Subcutaneous QHS  . ferrous sulfate  325 mg Oral QHS  . gabapentin  800 mg Oral TID  . insulin aspart  0-5 Units Subcutaneous QHS  . insulin aspart  0-9 Units Subcutaneous TID WC  . nicotine  21 mg Transdermal Daily  . pantoprazole  40 mg Oral Daily  . sertraline  100 mg Oral QHS  . sodium chloride  3 mL Intravenous Q12H   Continuous Infusions: . sodium chloride 10 mL/hr at 10/29/12 0858  .  sodium chloride Stopped (10/29/12 0856)  . furosemide (LASIX) infusion 10 mg/hr (10/29/12 0855)  . milrinone 0.25 mcg/kg/min (10/29/12 1100)    Principal Problem:   Acute exacerbation of CHF (congestive heart failure) Active Problems:   ADENOCARCINOMA, BREAST   DM   DM type  2 (diabetes mellitus, type 2)   Dyspnea   GERD (gastroesophageal reflux disease)   Cigarette nicotine dependence, uncomplicated   Arthritis   Iron deficiency anemia   Dyslipidemia   Insomnia   Abdominal pain   Acute systolic CHF (congestive heart failure)    Time spent: > 40 minutes     Esperanza Sheets  [facility] Pager [phone]. If 7PM-7AM, please contact night-coverage at [email or web address], password [facility] 10/29/2012, 3:17 PM  LOS: 3 days

## 2012-10-29 NOTE — Progress Notes (Signed)
Completed 250 ml NS bolus. Pt c/o feeling very,very dizzy. BP rechecked-74/42 manually. Notified on call K. Schorr . New order received : give another 250 ml bolus. Will cont to monitor pt closely.

## 2012-10-29 NOTE — Progress Notes (Signed)
PROGRESS NOTE  Subjective:   I was called by the nurse for persistent hypotension.  The patient is sleepy but otherwise has no new symptoms.    Is on a lasix drip - I/O today is net -400 cc.  Objective:    Vital Signs:   Temp:  [96.6 F (35.9 C)-98.6 F (37 C)] 98.1 F (36.7 C) (09/02 1915) Pulse Rate:  [52-99] 96 (09/02 1915) Resp:  [16-28] 23 (09/02 1915) BP: (70-127)/(31-102) 87/31 mmHg (09/02 1915) SpO2:  [89 %-100 %] 98 % (09/02 1915) Weight:  [175 lb 0.7 oz (79.4 kg)] 175 lb 0.7 oz (79.4 kg) (09/02 0620)  Last BM Date: 10/29/12   24-hour weight change: Weight change: 4 lb 13.6 oz (2.2 kg)  Weight trends: Filed Weights   10/27/12 0504 10/28/12 0500 10/29/12 0620  Weight: 174 lb 2.6 oz (79 kg) 170 lb 3.1 oz (77.2 kg) 175 lb 0.7 oz (79.4 kg)    Intake/Output:  09/01 0701 - 09/02 0700 In: 720 [P.O.:720] Out: 901 [Urine:900; Stool:1]     Physical Exam: BP 87/31  Pulse 96  Temp(Src) 98.1 F (36.7 C) (Oral)  Resp 23  Ht 5\' 5"  (1.651 m)  Wt 175 lb 0.7 oz (79.4 kg)  BMI 29.13 kg/m2  SpO2 98%   CVP = 18 PCWP is 20  General: Vital signs reviewed and noted.   Head: Normocephalic, atraumatic.  Eyes: conjunctivae/corneas clear.  EOM's intact.   Throat: normal  Neck:  normal, + JVD  Lungs:  clear  Heart:  RR  Abdomen:  Soft, non-tender, non-distended    Extremities: No edema   Neurologic: A&O X3, CN II - XII are grossly intact.   Psych: Normal     Labs: BMET:  Recent Labs  10/26/12 2125  10/28/12 0440 10/29/12 0450  NA  --   < > 135 127*  K  --   < > 2.8* 5.3*  CL  --   < > 89* 87*  CO2  --   < > 34* 26  GLUCOSE  --   < > 122* 245*  BUN  --   < > 16 27*  CREATININE  --   < > 0.85 1.53*  CALCIUM  --   < > 8.7 8.9  MG 1.5  --   --   --   < > = values in this interval not displayed.  Liver function tests: No results found for this basename: AST, ALT, ALKPHOS, BILITOT, PROT, ALBUMIN,  in the last 72 hours No results found for this basename:  LIPASE, AMYLASE,  in the last 72 hours  CBC: No results found for this basename: WBC, NEUTROABS, HGB, HCT, MCV, PLT,  in the last 72 hours  Cardiac Enzymes:  Recent Labs  10/26/12 2125 10/27/12 0235  TROPONINI <0.30 <0.30    Coagulation Studies: No results found for this basename: LABPROT, INR,  in the last 72 hours  Other: No components found with this basename: POCBNP,  No results found for this basename: DDIMER,  in the last 72 hours  Recent Labs  10/26/12 2125  HGBA1C 9.6*   No results found for this basename: CHOL, HDL, LDLCALC, TRIG, CHOLHDL,  in the last 72 hours  Recent Labs  10/26/12 2125  TSH 3.327   No results found for this basename: VITAMINB12, FOLATE, FERRITIN, TIBC, IRON, RETICCTPCT,  in the last 72 hours   Other results:  Tele:  NSR at 97  Medications:    Infusions: .  sodium chloride 10 mL/hr at 10/29/12 0858  . sodium chloride Stopped (10/29/12 0856)  . furosemide (LASIX) infusion 10 mg/hr (10/29/12 0855)  . milrinone 0.25 mcg/kg/min (10/29/12 1100)    Scheduled Medications: . ipratropium  0.5 mg Nebulization BID   And  . albuterol  2.5 mg Nebulization BID  . aspirin EC  81 mg Oral Daily  . budesonide (PULMICORT) nebulizer solution  0.25 mg Nebulization BID  . calcium-vitamin D  1 tablet Oral BID  . digoxin  0.125 mg Oral Daily  . enoxaparin (LOVENOX) injection  40 mg Subcutaneous QHS  . ferrous sulfate  325 mg Oral QHS  . gabapentin  800 mg Oral TID  . insulin aspart  0-5 Units Subcutaneous QHS  . insulin aspart  0-9 Units Subcutaneous TID WC  . insulin glargine  10 Units Subcutaneous QHS  . nicotine  21 mg Transdermal Daily  . pantoprazole  40 mg Oral Daily  . sertraline  100 mg Oral QHS  . sodium chloride  3 mL Intravenous Q12H    Assessment/ Plan:   Principal Problem:   Acute exacerbation of CHF (congestive heart failure) Active Problems:   ADENOCARCINOMA, BREAST   DM   DM type 2 (diabetes mellitus, type 2)   Dyspnea    GERD (gastroesophageal reflux disease)   Cigarette nicotine dependence, uncomplicated   Arthritis   Iron deficiency anemia   Dyslipidemia   Insomnia   Abdominal pain   Acute systolic CHF (congestive heart failure)  Ms. Fenstermaker has been somewhat hypotensive all day.  Her last labs show acute renal insufficiency.  Her last Co-Ox was 47. Will repeat those labs.  Will continue the mllrinone for now.  We may have to substitute Dobutamine for the Milrinone if her BP remains low.    Length of Stay: 3  Vesta Mixer, Montez Hageman., MD, [facility] 10/29/2012, 7:41 PM Office [phone] Pager [phone]

## 2012-10-29 NOTE — Progress Notes (Signed)
Event: Notified by RN that pt has now completed her second 250 cc NS bolus w/o any improvement in BP. Pt's SBP has been persistently in the 70's. Minimal improvement in BP w/ first NS bolus to 86/69 but quickly trended back down to 70's. Pt has intermittently c/o dizziness through-out the shift which she has c/o through-out the day. NP to bedside. Subjective: Pt currently denies CP or SOB. States she was finally able to get to sleep prior to my arrival. She denies significant dizziness right now just states she feels very "washed out". Pt states she has not voided at all in several hours, possibly sometime this afternoon and a very small amount at that time.  Objective: Emily [name] is a 52 y/o female admitted 10/26/12 for Acute CHF (New onset)the patient has a PMHx of T2DM with peripheral neuropathy, breast cancer s/p lumpectomy, XRT, and chemotherapy, COPD with ongoing tobacco use, GERD, pancreatitis, depression who presented to ED w/ c/o with progressive SOB. The patient was last at their baseline health over a month ago. She has had progressive DOE with wheezing, nonproductive cough, and lower extremity edema over the last month. It had slowly been progressing and her PCP sent her to Dr. Sherene Sires w/ [facility] who recommended smoking cessation, albuterol prn and continued use of PPI. She has continued to smoke. Her previous CXR was c/w COPD. Over the last 2-3 days, however, she reported a 6-lb weight gain, increased painful abdominal distension, increased LEE, PND, and orthopnea which has kept her awake the last few nights. She presented to ED d/t dypsnea at rest. She denied fevers, chills or chest pain. She also reported  some palpitations with tachycardia. BNP on admission was 3089, CXR c/w pulmonary edema. She has been diuresed and is currently negative 4L. Lasix has been held today d/t hypotension. Echo from 10/26/12 revealed EF of 20-25% w/ diffuse hypokinesis. CE have remained negative. At bedside pt  noted resting quietly in NAD. She awakens easily and is oriented x 3. Current manual BP is 70/40's in both arms. HR-84. Remaining VSS. Pt is somewhat pale and cool to touch. Cap refill > 3 sec. PPP but very faint and difficult to palpate. BBS CTA, faint heart sounds. Seen by Dr Verdis Prime w/ cardiology service yesterday.  Assessment/Plan: 1. Persistent Hypotension in clinical setting of pt w/ new onset acute CHF.  CHF felt d/t chemo she rec'd for breast cancer in 2009. Now symptomatic of low output heart failure. Discussed pt w/ Dr Gala Romney who has requested pt be transferred to [facility]/ICU for likely [name] placement and Dobutamine qtt. Discussed plan w/ pt at length and she is agreeable for transfer and SG placement .Dr Gala Romney requested Dobutamine qtt be hung but not started. He has also requested that [facility] "co-manage". Company secretary and [name], NP-C w/ [facility] notified of pt's pending transfer and plan. Will place foley and prepare for transfer to [facility].  Will continue to monitor closely, awaiting [facility].  Emily Chang, NP-C [facility] Pager [phone]

## 2012-10-30 DIAGNOSIS — R570 Cardiogenic shock: Secondary | ICD-10-CM

## 2012-10-30 LAB — CBC
Hemoglobin: 10.8 g/dL — ABNORMAL LOW (ref 12.0–15.0)
MCH: 26.6 pg (ref 26.0–34.0)
Platelets: 165 10*3/uL (ref 150–400)
RBC: 4.06 MIL/uL (ref 3.87–5.11)

## 2012-10-30 LAB — COMPREHENSIVE METABOLIC PANEL
ALT: 13 U/L (ref 0–35)
AST: 26 U/L (ref 0–37)
Alkaline Phosphatase: 35 U/L — ABNORMAL LOW (ref 39–117)
CO2: 30 mEq/L (ref 19–32)
Calcium: 8 mg/dL — ABNORMAL LOW (ref 8.4–10.5)
Potassium: 3.5 mEq/L (ref 3.5–5.1)
Sodium: 131 mEq/L — ABNORMAL LOW (ref 135–145)
Total Protein: 5.9 g/dL — ABNORMAL LOW (ref 6.0–8.3)

## 2012-10-30 LAB — GLUCOSE, CAPILLARY
Glucose-Capillary: 232 mg/dL — ABNORMAL HIGH (ref 70–99)
Glucose-Capillary: 243 mg/dL — ABNORMAL HIGH (ref 70–99)

## 2012-10-30 MED ORDER — INSULIN ASPART 100 UNIT/ML ~~LOC~~ SOLN
0.0000 [IU] | Freq: Three times a day (TID) | SUBCUTANEOUS | Status: DC
  Administered 2012-10-30: 3 [IU] via SUBCUTANEOUS
  Administered 2012-10-30: 5 [IU] via SUBCUTANEOUS
  Administered 2012-10-31: 8 [IU] via SUBCUTANEOUS
  Administered 2012-11-01: 08:00:00 via SUBCUTANEOUS
  Administered 2012-11-01 – 2012-11-02 (×3): 5 [IU] via SUBCUTANEOUS
  Administered 2012-11-02: 2 [IU] via SUBCUTANEOUS
  Administered 2012-11-03 (×2): 3 [IU] via SUBCUTANEOUS
  Administered 2012-11-04 (×2): 2 [IU] via SUBCUTANEOUS
  Administered 2012-11-04: 15 [IU] via SUBCUTANEOUS
  Administered 2012-11-05: 3 [IU] via SUBCUTANEOUS

## 2012-10-30 MED ORDER — INSULIN ASPART 100 UNIT/ML ~~LOC~~ SOLN
4.0000 [IU] | Freq: Three times a day (TID) | SUBCUTANEOUS | Status: DC
  Administered 2012-10-30 – 2012-11-02 (×8): 4 [IU] via SUBCUTANEOUS

## 2012-10-30 MED ORDER — ENALAPRIL MALEATE 5 MG PO TABS
5.0000 mg | ORAL_TABLET | Freq: Two times a day (BID) | ORAL | Status: DC
  Administered 2012-10-30 – 2012-10-31 (×3): 5 mg via ORAL
  Filled 2012-10-30 (×5): qty 1

## 2012-10-30 MED ORDER — ENALAPRIL MALEATE 2.5 MG PO TABS
2.5000 mg | ORAL_TABLET | Freq: Two times a day (BID) | ORAL | Status: DC
  Administered 2012-10-30: 2.5 mg via ORAL
  Filled 2012-10-30 (×2): qty 1

## 2012-10-30 NOTE — Progress Notes (Signed)
Advanced Heart Failure Team Rounding Note  Referring Physician: [facility] Primary Physician: [name], MD Primary Cardiologist:  [facility] Leia Alf, MD  Reason for Consultation: Low output HF  Subjective   Emily [name] is a 52 y/o woman with COPD (ongoing tobacco use), DM2, HTN, breast CA status post Adriamycin/Cytoxan/Taxol/lumpectomy/radiation (2009). She was admitted with acute HF. Echo with EF 20-25% and grade 2 DD.   Swan placed yesterday with evidence of biventricular HF and low CO (CI 1.1). Started milrinone and lasix g55. Down about 500cc. Feels better. No dyspnea. Renal function improving. BP improving. Sugars running high.   Co-ox 47% -> 72%   Last swan numbers:  CVP 17 PA 41/22 (30) PCWP 17 Thermo 4.7/2.5  Objective:    Vital Signs:   Temp:  [96.6 F (35.9 C)-99 F (37.2 C)] 97.3 F (36.3 C) (09/03 0800) Pulse Rate:  [81-105] 97 (09/03 0800) Resp:  [15-29] 23 (09/03 0800) BP: (79-124)/(31-102) 118/63 mmHg (09/03 0800) SpO2:  [89 %-100 %] 94 % (09/03 0800) Weight:  [80.196 kg (176 lb 12.8 oz)] 80.196 kg (176 lb 12.8 oz) (09/03 0423) Last BM Date: 10/29/12  Weight change: Filed Weights   10/28/12 0500 10/29/12 0620 10/30/12 0423  Weight: 77.2 kg (170 lb 3.1 oz) 79.4 kg (175 lb 0.7 oz) 80.196 kg (176 lb 12.8 oz)    Intake/Output:   Intake/Output Summary (Last 24 hours) at 10/30/12 0817 Last data filed at 10/30/12 0800  Gross per 24 hour  Intake 1847.94 ml  Output   2825 ml  Net -977.06 ml     Physical Exam: General:  Chronically ill appearing. No resp difficulty HEENT: normal Neck: supple. JVP to ear. Carotids 2+ bilat; no bruits. No lymphadenopathy or thryomegaly appreciated. Cor: PMI laterally displaced. Regular rate & rhythm. 2/6 TR 2/6 MR +s3 Lungs: clear Abdomen: soft, nontender, nondistended. No hepatosplenomegaly. No bruits or masses. Good bowel sounds. Extremities: Warm. Tr edema. No rash.  Neuro: alert & orientedx3, cranial nerves grossly  intact. moves all 4 extremities w/o difficulty. Affect pleasant  Telemetry: SR 90s  Labs: Basic Metabolic Panel:  Recent Labs Lab 10/26/12 1700 10/26/12 2125 10/27/12 0235 10/28/12 0440 10/29/12 0450 10/29/12 1900 10/30/12 0405  NA 130*  --  130* 135 127* 131* 131*  K 3.9  --  2.9* 2.8* 5.3* 3.3* 3.5  CL 94*  --  90* 89* 87* 88* 88*  CO2 25  --  24 34* 26 30 30   GLUCOSE 191*  --  192* 122* 245* 285* 287*  BUN 14  --  17 16 27* 31* 29*  CREATININE 0.62  --  0.82 0.85 1.53* 1.41* 1.29*  CALCIUM 9.4  --  9.3 8.7 8.9 8.3* 8.0*  MG  --  1.5  --   --   --   --   --     Liver Function Tests:  Recent Labs Lab 10/26/12 1700 10/30/12 0405  AST 35 26  ALT 16 13  ALKPHOS 44 35*  BILITOT 0.9 0.9  PROT 6.9 5.9*  ALBUMIN 3.4* 3.0*    Recent Labs Lab 10/26/12 1700  LIPASE 18   No results found for this basename: AMMONIA,  in the last 168 hours  CBC:  Recent Labs Lab 10/26/12 1700 10/30/12 0405  WBC 10.2 12.2*  NEUTROABS 6.4  --   HGB 12.6 10.8*  HCT 38.6 31.6*  MCV 81.8 77.8*  PLT 282 165    Cardiac Enzymes:  Recent Labs Lab 10/26/12 1700 10/26/12 2125 10/27/12  0235  TROPONINI <0.30 <0.30 <0.30     BNP: BNP (last 3 results)  Recent Labs  10/26/12 1700  PROBNP 3089.0*    CBG:  Recent Labs Lab 10/29/12 0810 10/29/12 1127 10/29/12 1722 10/29/12 2109 10/30/12 0745  GLUCAP 227* 262* 277* 335* 232*    Coagulation Studies: No results found for this basename: LABPROT, INR,  in the last 72 hours  Other results: EKG: ST 105. PRWP (cannot exclude previous ASMI) Nobspecifc ST abnormalities.    Imaging: Dg Chest Port 1 View  10/29/2012   *RADIOLOGY REPORT*  Clinical Data: Swan-Ganz catheter placement.  PORTABLE CHEST - 1 VIEW  Comparison: PA and lateral chest 10/26/2012.  Findings: The patient has a new right IJ approach Swan-Ganz catheter.  The tip of the catheter is in the proximal most descending interlobar pulmonary artery on the right.   There is no pneumothorax.  Lungs are clear.  Tiny right pleural effusion is noted.  There is cardiomegaly.  No pulmonary edema.  IMPRESSION:  1.  Tip of right IJ catheter is in the proximal most descending interlobar pulmonary artery on the right.  Negative for pneumothorax. 2.  Trace right pleural effusion.   Original Report Authenticated By: Holley Dexter, M.D.     Medications:     Current Medications: . ipratropium  0.5 mg Nebulization BID   And  . albuterol  2.5 mg Nebulization BID  . aspirin EC  81 mg Oral Daily  . budesonide (PULMICORT) nebulizer solution  0.25 mg Nebulization BID  . calcium-vitamin D  1 tablet Oral BID  . digoxin  0.125 mg Oral Daily  . enoxaparin (LOVENOX) injection  40 mg Subcutaneous QHS  . ferrous sulfate  325 mg Oral QHS  . gabapentin  800 mg Oral TID  . insulin aspart  0-5 Units Subcutaneous QHS  . insulin aspart  0-9 Units Subcutaneous TID WC  . insulin glargine  10 Units Subcutaneous QHS  . nicotine  21 mg Transdermal Daily  . pantoprazole  40 mg Oral Daily  . sertraline  100 mg Oral QHS  . sodium chloride  3 mL Intravenous Q12H    Infusions: . sodium chloride 10 mL/hr at 10/29/12 0858  . sodium chloride Stopped (10/29/12 0856)  . furosemide (LASIX) infusion 10 mg/hr (10/29/12 0855)  . milrinone 0.25 mcg/kg/min (10/29/12 2125)     Assessment:   1. Acute systolic HF - suspect due to adriamycin toxicity     --EF 20-25% 2. Cardiogenic shock 3. Breast CA     --status post Adriamycin/Cytoxan/Taxol/lumpectomy/radiation, 2009 4. DM2 5. COPD 6. H/o HTN      Plan/Discussion:    Much improved with inotropic support. PCWP improved but RA pressure still high. Will switch IV lasix to demadex. Will add enalapril 2.5 bid. No b-blocker yet. Will likely need coronary angio tomorrow.   [name] 8:17 AM  Advanced Heart Failure Team Pager [phone] (M-F; 7a - 4p)  Please contact [facility] for night-coverage after hours (4p -7a  ) and weekends on [email or web address]

## 2012-10-30 NOTE — Progress Notes (Signed)
Spoke with patient who is in good spirits. Appreciate seeing great care provided by the heart failure team.

## 2012-10-30 NOTE — Progress Notes (Signed)
Inpatient Diabetes Program Recommendations  AACE/ADA: New Consensus Statement on Inpatient Glycemic Control (2013)  Target Ranges:  Prepandial:   less than 140 mg/dL      Peak postprandial:   less than 180 mg/dL (1-2 hours)      Critically ill patients:  140 - 180 mg/dL  Results for CONNOR, FOXWORTHY (MRN [id number]) as of 10/30/2012 10:52  Ref. Range 10/29/2012 08:10 10/29/2012 11:27 10/29/2012 17:22 10/29/2012 21:09 10/30/2012 07:45  Glucose-Capillary Latest Range: 70-99 mg/dL 130 (H) 865 (H) 784 (H) 335 (H) 232 (H)   Inpatient Diabetes Program Recommendations Insulin - Basal: Increase Lantus to 15 units  Insulin - Meal Coverage: consider adding Novolog 4 units TID with meals for elevated postprandial CBGs Thank you  Piedad Climes BSN, RN,CDE Inpatient Diabetes Coordinator [phone] (team pager)

## 2012-10-30 NOTE — Progress Notes (Signed)
[facility] PROGRESS NOTE  Emily [name] WJX:[id number] DOB: 03-20-1960 DOA: 10/26/2012 PCP: [name], MD  Assessment/Plan: 1. Acute decompensated systolic HF, cardiogenic shock ? due to adriamycin toxicity --EF 20-25%; I/O neg 4L; Initial CVP 30; PCWP ~25; PA sat 47%  -on IV lasix drip; + milrinone -- being changed to demadex -cont dig,  - LHC tomm   2. DM2; A1c 9.6; home metformin+gli[pize  -d/c metformin due to AKI  -start lantus 10 units QHS; +SSI+ novolog with meals   3. AKI, hypo Na likely due to cardiogenic shock +/- diuretics  -close monitor renal function on diuresis   4. COPD, smoker  -currently stable, tobacco cessation counseleing  - Duonebs and pulmicort   5. Abdominal pain and distension,  -due to volume overload and abd wall edema  -improved, tolerating diet  - Abd [place] without acsites   6. Breast CA  --status post Adriamycin/Cytoxan/Taxol/lumpectomy/radiation, 2009     Code Status: full Family Communication: patient Disposition Plan: SDU   Consultants:  cards  Procedures:    Antibiotics:    HPI/Subjective: Feeling well, no c/o  Objective: Filed Vitals:   10/30/12 1134  BP:   Pulse: 101  Temp: 97.9 F (36.6 C)  Resp: 21    Intake/Output Summary (Last 24 hours) at 10/30/12 1224 Last data filed at 10/30/12 1100  Gross per 24 hour  Intake   1868 ml  Output   3325 ml  Net  -1457 ml   Filed Weights   10/28/12 0500 10/29/12 0620 10/30/12 0423  Weight: 77.2 kg (170 lb 3.1 oz) 79.4 kg (175 lb 0.7 oz) 80.196 kg (176 lb 12.8 oz)    Exam:  General: Chronically ill appearing. No resp difficulty  Neck: supple. JVD to ear.  Heart: Regular rate & rhythm. 2/6 TR 2/6 MR +s3  Lungs: clear  Abdomen: soft, nontender, nondistended. No hepatosplenomegaly. No bruits or masses. Good bowel sounds.  Neuro: alert & orientedx3, cranial nerves grossly intact. moves all 4 extremities w/o difficulty.   Data Reviewed: Basic Metabolic  Panel:  Recent Labs Lab 10/26/12 1700 10/26/12 2125 10/27/12 0235 10/28/12 0440 10/29/12 0450 10/29/12 1900 10/30/12 0405  NA 130*  --  130* 135 127* 131* 131*  K 3.9  --  2.9* 2.8* 5.3* 3.3* 3.5  CL 94*  --  90* 89* 87* 88* 88*  CO2 25  --  24 34* 26 30 30   GLUCOSE 191*  --  192* 122* 245* 285* 287*  BUN 14  --  17 16 27* 31* 29*  CREATININE 0.62  --  0.82 0.85 1.53* 1.41* 1.29*  CALCIUM 9.4  --  9.3 8.7 8.9 8.3* 8.0*  MG  --  1.5  --   --   --   --   --    Liver Function Tests:  Recent Labs Lab 10/26/12 1700 10/30/12 0405  AST 35 26  ALT 16 13  ALKPHOS 44 35*  BILITOT 0.9 0.9  PROT 6.9 5.9*  ALBUMIN 3.4* 3.0*    Recent Labs Lab 10/26/12 1700  LIPASE 18   No results found for this basename: AMMONIA,  in the last 168 hours CBC:  Recent Labs Lab 10/26/12 1700 10/30/12 0405  WBC 10.2 12.2*  NEUTROABS 6.4  --   HGB 12.6 10.8*  HCT 38.6 31.6*  MCV 81.8 77.8*  PLT 282 165   Cardiac Enzymes:  Recent Labs Lab 10/26/12 1700 10/26/12 2125 10/27/12 0235  TROPONINI <0.30 <0.30 <0.30   BNP (last  3 results)  Recent Labs  10/26/12 1700  PROBNP 3089.0*   CBG:  Recent Labs Lab 10/29/12 0810 10/29/12 1127 10/29/12 1722 10/29/12 2109 10/30/12 0745  GLUCAP 227* 262* 277* 335* 232*    Recent Results (from the past 240 hour(s))  MRSA PCR SCREENING     Status: None   Collection Time    10/29/12 12:40 PM      Result Value Range Status   MRSA by PCR NEGATIVE  NEGATIVE Final   Comment:            The GeneXpert MRSA Assay (FDA     approved for NASAL specimens     only), is one component of a     comprehensive MRSA colonization     surveillance program. It is not     intended to diagnose MRSA     infection nor to guide or     monitor treatment for     MRSA infections.     Studies: Dg Chest Port 1 View  10/29/2012   *RADIOLOGY REPORT*  Clinical Data: Swan-Ganz catheter placement.  PORTABLE CHEST - 1 VIEW  Comparison: PA and lateral chest  10/26/2012.  Findings: The patient has a new right IJ approach Swan-Ganz catheter.  The tip of the catheter is in the proximal most descending interlobar pulmonary artery on the right.  There is no pneumothorax.  Lungs are clear.  Tiny right pleural effusion is noted.  There is cardiomegaly.  No pulmonary edema.  IMPRESSION:  1.  Tip of right IJ catheter is in the proximal most descending interlobar pulmonary artery on the right.  Negative for pneumothorax. 2.  Trace right pleural effusion.   Original Report Authenticated By: Holley Dexter, M.D.    Scheduled Meds: . ipratropium  0.5 mg Nebulization BID   And  . albuterol  2.5 mg Nebulization BID  . aspirin EC  81 mg Oral Daily  . budesonide (PULMICORT) nebulizer solution  0.25 mg Nebulization BID  . calcium-vitamin D  1 tablet Oral BID  . digoxin  0.125 mg Oral Daily  . enalapril  2.5 mg Oral BID  . enoxaparin (LOVENOX) injection  40 mg Subcutaneous QHS  . ferrous sulfate  325 mg Oral QHS  . gabapentin  800 mg Oral TID  . insulin aspart  0-15 Units Subcutaneous TID WC  . insulin aspart  0-5 Units Subcutaneous QHS  . insulin aspart  4 Units Subcutaneous TID WC  . insulin glargine  10 Units Subcutaneous QHS  . nicotine  21 mg Transdermal Daily  . pantoprazole  40 mg Oral Daily  . sertraline  100 mg Oral QHS  . sodium chloride  3 mL Intravenous Q12H   Continuous Infusions: . sodium chloride 10 mL/hr at 10/29/12 0858  . sodium chloride Stopped (10/29/12 0856)  . milrinone 0.25 mcg/kg/min (10/29/12 2125)    Principal Problem:   Acute exacerbation of CHF (congestive heart failure) Active Problems:   ADENOCARCINOMA, BREAST   DM   DM type 2 (diabetes mellitus, type 2)   Dyspnea   GERD (gastroesophageal reflux disease)   Cigarette nicotine dependence, uncomplicated   Arthritis   Iron deficiency anemia   Dyslipidemia   Insomnia   Abdominal pain   Acute systolic CHF (congestive heart failure)   Cardiogenic shock    Time  spent: 35    [facility], [name]  [facility] Pager [phone]. If 7PM-7AM, please contact night-coverage at [email or web address], password [facility] 10/30/2012, 12:24 PM  LOS: 4 days

## 2012-10-31 ENCOUNTER — Encounter (HOSPITAL_COMMUNITY)
Admission: EM | Disposition: A | Discharge status: Home Health | Payer: Self-pay | Source: Home / Self Care | Attending: Internal Medicine

## 2012-10-31 DIAGNOSIS — E871 Hypo-osmolality and hyponatremia: Secondary | ICD-10-CM

## 2012-10-31 HISTORY — PX: LEFT HEART CATHETERIZATION WITH CORONARY ANGIOGRAM: SHX5451

## 2012-10-31 LAB — CARBOXYHEMOGLOBIN
Carboxyhemoglobin: 2 % — ABNORMAL HIGH (ref 0.5–1.5)
Methemoglobin: 1.2 % (ref 0.0–1.5)
O2 Saturation: 61.2 %
Total hemoglobin: 11.3 g/dL — ABNORMAL LOW (ref 12.0–16.0)

## 2012-10-31 LAB — BASIC METABOLIC PANEL
Calcium: 8.2 mg/dL — ABNORMAL LOW (ref 8.4–10.5)
GFR calc Af Amer: >90 mL/min (ref >90)
GFR calc non Af Amer: >90 mL/min (ref >90)
Glucose, Bld: 114 mg/dL — ABNORMAL HIGH (ref 70–99)
Sodium: 130 mEq/L — ABNORMAL LOW (ref 135–145)

## 2012-10-31 LAB — PROTIME-INR: Prothrombin Time: 17.1 seconds — ABNORMAL HIGH (ref 11.6–15.2)

## 2012-10-31 LAB — GLUCOSE, CAPILLARY
Glucose-Capillary: 104 mg/dL — ABNORMAL HIGH (ref 70–99)
Glucose-Capillary: 286 mg/dL — ABNORMAL HIGH (ref 70–99)
Glucose-Capillary: 330 mg/dL — ABNORMAL HIGH (ref 70–99)

## 2012-10-31 SURGERY — LEFT HEART CATHETERIZATION WITH CORONARY ANGIOGRAM
Anesthesia: LOCAL

## 2012-10-31 MED ORDER — SODIUM CHLORIDE 0.9 % IJ SOLN: 3.0000 mL | Freq: Two times a day (BID) | INTRAMUSCULAR | Status: DC

## 2012-10-31 MED ORDER — SODIUM CHLORIDE 0.9 % IJ SOLN: 3.0000 mL | INTRAMUSCULAR | Status: DC | PRN

## 2012-10-31 MED ORDER — POTASSIUM CHLORIDE CRYS ER 20 MEQ PO TBCR
EXTENDED_RELEASE_TABLET | ORAL | Status: AC
  Filled 2012-10-31: qty 2

## 2012-10-31 MED ORDER — SPIRONOLACTONE 12.5 MG HALF TABLET
12.5000 mg | ORAL_TABLET | Freq: Every day | ORAL | Status: DC
  Administered 2012-10-31: 12.5 mg via ORAL
  Filled 2012-10-31: qty 1

## 2012-10-31 MED ORDER — SODIUM CHLORIDE 0.9 % IV SOLN: 250.0000 mL | INTRAVENOUS | Status: DC | PRN

## 2012-10-31 MED ORDER — PREDNISONE 50 MG PO TABS
60.0000 mg | ORAL_TABLET | Freq: Once | ORAL | Status: AC
  Administered 2012-10-31: 60 mg via ORAL
  Filled 2012-10-31: qty 1

## 2012-10-31 MED ORDER — FENTANYL CITRATE 0.05 MG/ML IJ SOLN
INTRAMUSCULAR | Status: AC
  Filled 2012-10-31: qty 2

## 2012-10-31 MED ORDER — FUROSEMIDE 10 MG/ML IJ SOLN
80.0000 mg | Freq: Two times a day (BID) | INTRAMUSCULAR | Status: DC
  Administered 2012-10-31 – 2012-11-01 (×3): 80 mg via INTRAVENOUS
  Filled 2012-10-31 (×6): qty 8

## 2012-10-31 MED ORDER — POTASSIUM CHLORIDE CRYS ER 20 MEQ PO TBCR
40.0000 meq | EXTENDED_RELEASE_TABLET | Freq: Once | ORAL | Status: AC
  Administered 2012-10-31: 40 meq via ORAL
  Filled 2012-10-31: qty 2

## 2012-10-31 MED ORDER — MIDAZOLAM HCL 2 MG/2ML IJ SOLN
INTRAMUSCULAR | Status: AC
  Filled 2012-10-31: qty 2

## 2012-10-31 MED ORDER — ASPIRIN EC 81 MG PO TBEC
81.0000 mg | DELAYED_RELEASE_TABLET | Freq: Every day | ORAL | Status: DC
  Administered 2012-11-01 – 2012-11-05 (×5): 81 mg via ORAL
  Filled 2012-10-31 (×5): qty 1

## 2012-10-31 MED ORDER — ASPIRIN 81 MG PO CHEW
324.0000 mg | CHEWABLE_TABLET | ORAL | Status: AC
  Administered 2012-10-31: 324 mg via ORAL
  Filled 2012-10-31: qty 4

## 2012-10-31 MED ORDER — HEPARIN (PORCINE) IN NACL 2-0.9 UNIT/ML-% IJ SOLN
INTRAMUSCULAR | Status: AC
  Filled 2012-10-31: qty 1500

## 2012-10-31 MED ORDER — POTASSIUM CHLORIDE CRYS ER 20 MEQ PO TBCR
40.0000 meq | EXTENDED_RELEASE_TABLET | Freq: Once | ORAL | Status: AC
  Administered 2012-10-31: 40 meq via ORAL

## 2012-10-31 MED ORDER — TORSEMIDE 20 MG PO TABS
20.0000 mg | ORAL_TABLET | Freq: Two times a day (BID) | ORAL | Status: DC
  Administered 2012-10-31: 20 mg via ORAL
  Filled 2012-10-31 (×3): qty 1

## 2012-10-31 MED ORDER — SODIUM CHLORIDE 0.9 % IV SOLN: INTRAVENOUS | Status: AC

## 2012-10-31 MED ORDER — METHYLPREDNISOLONE SODIUM SUCC 125 MG IJ SOLR
125.0000 mg | INTRAMUSCULAR | Status: AC
  Administered 2012-10-31: 125 mg via INTRAVENOUS
  Filled 2012-10-31: qty 2

## 2012-10-31 MED ORDER — FAMOTIDINE IN NACL 20-0.9 MG/50ML-% IV SOLN
20.0000 mg | INTRAVENOUS | Status: AC
  Administered 2012-10-31: 20 mg via INTRAVENOUS
  Filled 2012-10-31: qty 50

## 2012-10-31 MED ORDER — DIPHENHYDRAMINE HCL 50 MG/ML IJ SOLN
25.0000 mg | INTRAMUSCULAR | Status: AC
  Administered 2012-10-31: 25 mg via INTRAVENOUS
  Filled 2012-10-31: qty 1

## 2012-10-31 MED ORDER — LIDOCAINE HCL (PF) 1 % IJ SOLN
INTRAMUSCULAR | Status: AC
  Filled 2012-10-31: qty 30

## 2012-10-31 MED ORDER — NITROGLYCERIN 0.2 MG/ML ON CALL CATH LAB
INTRAVENOUS | Status: AC
  Filled 2012-10-31: qty 1

## 2012-10-31 MED ORDER — DIPHENHYDRAMINE HCL 50 MG/ML IJ SOLN
INTRAMUSCULAR | Status: AC
  Filled 2012-10-31: qty 1

## 2012-10-31 NOTE — Progress Notes (Signed)
[facility] PROGRESS NOTE  ALAZNE QUANT ZOX:[id number] DOB: 18-Jan-1961 DOA: 10/26/2012 PCP: [name], MD  Assessment/Plan: 1. Acute decompensated systolic HF, cardiogenic shock  --EF 20-25%; I/O neg 4L; Initial CVP 30; PCWP ~25; PA sat 47%  -off IV lasix drip; + milrinone -- being changed to demadex -cont dig,  - LHC    2. DM2; A1c 9.6; home metformin+gli[pize  -d/c metformin due to AKI  -start lantus 10 units QHS; +SSI+ novolog with meals   3. AKI, hypo Na likely due to cardiogenic shock +/- diuretics  -close monitor renal function on diuresis  -decreasing  4. COPD, smoker  -currently stable, tobacco cessation counseleing  - Duonebs and pulmicort   5. Abdominal pain and distension,  -due to volume overload and abd wall edema  -improved, tolerating diet  - Abd [place] without acsites   6. Breast CA  --status post Adriamycin/Cytoxan/Taxol/lumpectomy/radiation, 2009     Code Status: full Family Communication: patient Disposition Plan: SDU   Consultants:  cards  Procedures:    Antibiotics:    HPI/Subjective: For cath today No SOB Mild swelling in legs  Objective: Filed Vitals:   10/31/12 0700  BP: 103/60  Pulse: 100  Temp: 98.1 F (36.7 C)  Resp: 22    Intake/Output Summary (Last 24 hours) at 10/31/12 0819 Last data filed at 10/31/12 0700  Gross per 24 hour  Intake   1658 ml  Output   2375 ml  Net   -717 ml   Filed Weights   10/29/12 0620 10/30/12 0423 10/31/12 0500  Weight: 79.4 kg (175 lb 0.7 oz) 80.196 kg (176 lb 12.8 oz) 80.513 kg (177 lb 8 oz)    Exam:  General: Chronically ill appearing. No resp difficulty  Neck: supple. JVD to ear.  Heart: Regular rate & rhythm. 2/6 TR 2/6 MR +s3  Lungs: clear  Abdomen: soft, nontender, nondistended. No hepatosplenomegaly. No bruits or masses. Good bowel sounds.  Neuro: alert & orientedx3, cranial nerves grossly intact. moves all 4 extremities w/o difficulty.   Data Reviewed: Basic  Metabolic Panel:  Recent Labs Lab 10/26/12 1700 10/26/12 2125 10/27/12 0235 10/28/12 0440 10/29/12 0450 10/29/12 1900 10/30/12 0405  NA 130*  --  130* 135 127* 131* 131*  K 3.9  --  2.9* 2.8* 5.3* 3.3* 3.5  CL 94*  --  90* 89* 87* 88* 88*  CO2 25  --  24 34* 26 30 30   GLUCOSE 191*  --  192* 122* 245* 285* 287*  BUN 14  --  17 16 27* 31* 29*  CREATININE 0.62  --  0.82 0.85 1.53* 1.41* 1.29*  CALCIUM 9.4  --  9.3 8.7 8.9 8.3* 8.0*  MG  --  1.5  --   --   --   --   --    Liver Function Tests:  Recent Labs Lab 10/26/12 1700 10/30/12 0405  AST 35 26  ALT 16 13  ALKPHOS 44 35*  BILITOT 0.9 0.9  PROT 6.9 5.9*  ALBUMIN 3.4* 3.0*    Recent Labs Lab 10/26/12 1700  LIPASE 18   No results found for this basename: AMMONIA,  in the last 168 hours CBC:  Recent Labs Lab 10/26/12 1700 10/30/12 0405  WBC 10.2 12.2*  NEUTROABS 6.4  --   HGB 12.6 10.8*  HCT 38.6 31.6*  MCV 81.8 77.8*  PLT 282 165   Cardiac Enzymes:  Recent Labs Lab 10/26/12 1700 10/26/12 2125 10/27/12 0235  TROPONINI <0.30 <0.30 <0.30  BNP (last 3 results)  Recent Labs  10/26/12 1700  PROBNP 3089.0*   CBG:  Recent Labs Lab 10/29/12 2109 10/30/12 0745 10/30/12 1214 10/30/12 1717 10/30/12 2233  GLUCAP 335* 232* 243* 182* 204*    Recent Results (from the past 240 hour(s))  MRSA PCR SCREENING     Status: None   Collection Time    10/29/12 12:40 PM      Result Value Range Status   MRSA by PCR NEGATIVE  NEGATIVE Final   Comment:            The GeneXpert MRSA Assay (FDA     approved for NASAL specimens     only), is one component of a     comprehensive MRSA colonization     surveillance program. It is not     intended to diagnose MRSA     infection nor to guide or     monitor treatment for     MRSA infections.     Studies: No results found.  Scheduled Meds: . ipratropium  0.5 mg Nebulization BID   And  . albuterol  2.5 mg Nebulization BID  . aspirin EC  81 mg Oral Daily   . budesonide (PULMICORT) nebulizer solution  0.25 mg Nebulization BID  . calcium-vitamin D  1 tablet Oral BID  . digoxin  0.125 mg Oral Daily  . enalapril  5 mg Oral BID  . enoxaparin (LOVENOX) injection  40 mg Subcutaneous QHS  . ferrous sulfate  325 mg Oral QHS  . gabapentin  800 mg Oral TID  . insulin aspart  0-15 Units Subcutaneous TID WC  . insulin aspart  0-5 Units Subcutaneous QHS  . insulin aspart  4 Units Subcutaneous TID WC  . insulin glargine  10 Units Subcutaneous QHS  . nicotine  21 mg Transdermal Daily  . pantoprazole  40 mg Oral Daily  . sertraline  100 mg Oral QHS  . sodium chloride  3 mL Intravenous Q12H   Continuous Infusions: . sodium chloride 10 mL/hr at 10/29/12 0858  . sodium chloride Stopped (10/29/12 0856)  . milrinone 0.25 mcg/kg/min (10/31/12 0726)    Principal Problem:   Acute exacerbation of CHF (congestive heart failure) Active Problems:   ADENOCARCINOMA, BREAST   DM   DM type 2 (diabetes mellitus, type 2)   Dyspnea   GERD (gastroesophageal reflux disease)   Cigarette nicotine dependence, uncomplicated   Arthritis   Iron deficiency anemia   Dyslipidemia   Insomnia   Abdominal pain   Acute systolic CHF (congestive heart failure)   Cardiogenic shock    Time spent: 35    [facility], JESSICA  [facility] Pager [phone]. If 7PM-7AM, please contact night-coverage at [email or web address], password [facility] 10/31/2012, 8:19 AM  LOS: 5 days

## 2012-10-31 NOTE — Progress Notes (Addendum)
Advanced Heart Failure Team Rounding Note  Referring Physician: [facility] Primary Physician: [name], MD Primary Cardiologist:  [facility] Emily Alf, MD  Reason for Consultation: Low output HF  Subjective   Emily [name] is a 52 y/o woman with COPD (ongoing tobacco use), DM2, HTN, breast CA status post Adriamycin/Cytoxan/Taxol/lumpectomy/radiation (2009). She was admitted with acute HF. Echo with EF 20-25% and grade 2 DD.   Feels better. Lying flat. No dyspnea or orthopnea. Enalapril being titrated.   Co-ox 47% -> 72% -> pending  Last swan numbers:  CVP 14 PA 44/24 (32) PCWP 25(?) Thermo 4.1/2.2 SVR 1100  Objective:    Vital Signs:   Temp:  [97.3 F (36.3 C)-98.9 F (37.2 C)] 98.1 F (36.7 C) (09/04 0700) Pulse Rate:  [91-107] 100 (09/04 0700) Resp:  [17-27] 22 (09/04 0700) BP: (95-132)/(49-75) 103/60 mmHg (09/04 0700) SpO2:  [88 %-97 %] 95 % (09/04 0725) Weight:  [80.513 kg (177 lb 8 oz)] 80.513 kg (177 lb 8 oz) (09/04 0500) Last BM Date: 10/29/12  Weight change: Filed Weights   10/29/12 0620 10/30/12 0423 10/31/12 0500  Weight: 79.4 kg (175 lb 0.7 oz) 80.196 kg (176 lb 12.8 oz) 80.513 kg (177 lb 8 oz)    Intake/Output:   Intake/Output Summary (Last 24 hours) at 10/31/12 0747 Last data filed at 10/31/12 0700  Gross per 24 hour  Intake   1934 ml  Output   2775 ml  Net   -841 ml     Physical Exam: General:  Lying flat in bed No resp difficulty HEENT: normal Neck: supple. Swan RIJ Carotids 2+ bilat; no bruits. No lymphadenopathy or thryomegaly appreciated. Cor: PMI laterally displaced. Regular rate & rhythm. 2/6 TR 2/6 MR +s3 Lungs: clear Abdomen: soft, nontender, nondistended. No hepatosplenomegaly. No bruits or masses. Good bowel sounds. Extremities: Warm. Tr edema. No rash.  Neuro: alert & orientedx3, cranial nerves grossly intact. moves all 4 extremities w/o difficulty. Affect pleasant  Telemetry: SR 90s  Labs: Basic Metabolic Panel:  Recent Labs Lab  10/26/12 1700 10/26/12 2125 10/27/12 0235 10/28/12 0440 10/29/12 0450 10/29/12 1900 10/30/12 0405  NA 130*  --  130* 135 127* 131* 131*  K 3.9  --  2.9* 2.8* 5.3* 3.3* 3.5  CL 94*  --  90* 89* 87* 88* 88*  CO2 25  --  24 34* 26 30 30   GLUCOSE 191*  --  192* 122* 245* 285* 287*  BUN 14  --  17 16 27* 31* 29*  CREATININE 0.62  --  0.82 0.85 1.53* 1.41* 1.29*  CALCIUM 9.4  --  9.3 8.7 8.9 8.3* 8.0*  MG  --  1.5  --   --   --   --   --     Liver Function Tests:  Recent Labs Lab 10/26/12 1700 10/30/12 0405  AST 35 26  ALT 16 13  ALKPHOS 44 35*  BILITOT 0.9 0.9  PROT 6.9 5.9*  ALBUMIN 3.4* 3.0*    Recent Labs Lab 10/26/12 1700  LIPASE 18   No results found for this basename: AMMONIA,  in the last 168 hours  CBC:  Recent Labs Lab 10/26/12 1700 10/30/12 0405  WBC 10.2 12.2*  NEUTROABS 6.4  --   HGB 12.6 10.8*  HCT 38.6 31.6*  MCV 81.8 77.8*  PLT 282 165    Cardiac Enzymes:  Recent Labs Lab 10/26/12 1700 10/26/12 2125 10/27/12 0235  TROPONINI <0.30 <0.30 <0.30     BNP: BNP (last 3 results)  Recent Labs  10/26/12 1700  PROBNP 3089.0*    CBG:  Recent Labs Lab 10/29/12 2109 10/30/12 0745 10/30/12 1214 10/30/12 1717 10/30/12 2233  GLUCAP 335* 232* 243* 182* 204*    Coagulation Studies: No results found for this basename: LABPROT, INR,  in the last 72 hours  Other results:    Imaging: No results found.   Medications:     Current Medications: . ipratropium  0.5 mg Nebulization BID   And  . albuterol  2.5 mg Nebulization BID  . aspirin EC  81 mg Oral Daily  . budesonide (PULMICORT) nebulizer solution  0.25 mg Nebulization BID  . calcium-vitamin D  1 tablet Oral BID  . digoxin  0.125 mg Oral Daily  . enalapril  5 mg Oral BID  . enoxaparin (LOVENOX) injection  40 mg Subcutaneous QHS  . ferrous sulfate  325 mg Oral QHS  . gabapentin  800 mg Oral TID  . insulin aspart  0-15 Units Subcutaneous TID WC  . insulin aspart  0-5  Units Subcutaneous QHS  . insulin aspart  4 Units Subcutaneous TID WC  . insulin glargine  10 Units Subcutaneous QHS  . nicotine  21 mg Transdermal Daily  . pantoprazole  40 mg Oral Daily  . sertraline  100 mg Oral QHS  . sodium chloride  3 mL Intravenous Q12H    Infusions: . sodium chloride 10 mL/hr at 10/29/12 0858  . sodium chloride Stopped (10/29/12 0856)  . milrinone 0.25 mcg/kg/min (10/31/12 0726)     Assessment:   1. Acute systolic HF - suspect due to adriamycin toxicity     --EF 20-25% 2. Cardiogenic shock 3. Breast CA     --status post Adriamycin/Cytoxan/Taxol/lumpectomy/radiation, 2009 4. DM2 5. COPD 6. H/o HTN 7. Hyponatremia/Hypokalemia     Plan/Discussion:    Much improved with inotropic support. Weight actually going up slightly. Lasix gtt stopped yesterday. Will start po torsemide. Renal function continues to improve.   Will add low dose carvedilol and spiro today and see if she can tolerate. Continue ACE and milrinone.   For coronary angio today with Dr. Katrinka Blazing. Will supp K+. Appreciate recs from DM2 team.     Emily [name] 7:47 AM  Advanced Heart Failure Team Pager [phone] (M-F; 7a - 4p)  Please contact [facility] for night-coverage after hours (4p -7a ) and weekends on [email or web address]
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
## 2012-10-31 NOTE — Interval H&P Note (Signed)
History and Physical Interval Note: Since this note the patient has undergone advanced heart failure therapy with invasive hemodynamic recordings and inotropic support. She presents to the cath lab now to undergo coronary angiography to exclude coronary disease as the etiology of left ventricular dysfunction.  10/31/2012 12:08 PM  Emily [name]  has presented today for surgery, with the diagnosis of cp  The various methods of treatment have been discussed with the patient and family. After consideration of risks, benefits and other options for treatment, the patient has consented to  Procedure(s): LEFT HEART CATHETERIZATION WITH CORONARY ANGIOGRAM (N/A) as a surgical intervention .  The patient's history has been reviewed, patient examined, no change in status, stable for surgery.  I have reviewed the patient's chart and labs.  Questions were answered to the patient's satisfaction.     Lesleigh Noe

## 2012-10-31 NOTE — CV Procedure (Signed)
     Diagnostic Cardiac Catheterization Report  Emily [name]  52 y.o.  female 1960/09/15  Procedure Date: 10/31/2012 Referring Physician: [name], M.D. Primary Cardiologist:: [facility] Leia Alf, M.D.   PROCEDURE:  Left heart catheterization with selective coronary angiography, left ventriculogram.  INDICATIONS:  Class IV heart failure, possibly related to chemotherapy induced  The risks, benefits, and details of the procedure were explained to the patient.  The patient verbalized understanding and wanted to proceed.  Informed written consent was obtained.  PROCEDURE TECHNIQUE:  After Xylocaine anesthesia a 5 French sheath was placed in the right femoral artery with a single anterior needle wall stick.   Coronary angiography was done using a 5 [place] A2 MP catheter.  Left ventriculography was done using the same catheter.    CONTRAST:  Total of 50 cc.  COMPLICATIONS:  None.    HEMODYNAMICS:  Aortic pressure was 100/69; LV pressure was 105/24; LVEDP 31 mm mercury.  There was no gradient between the left ventricle and aorta.    ANGIOGRAPHIC DATA:   The left main coronary artery is widely patent and normal..  The left anterior descending artery is widely patent with mid vessel diffuse disease up to 40-50% beyond the first diagonal takeoff. No high-grade or hemodynamically significant lesions were noted.  The left circumflex artery is eccentric 50-70% stenosis in the first obtuse marginal. Otherwise widely patent nondominant.  The right coronary artery is dominant. Luminal irregularities proximal mid and distal. PDA contains 50% proximal obstruction.Marland Kitchen  LEFT VENTRICULOGRAM:  Left ventricular angiogram was not done.  Hemodynamics were recorded. EDP was greater than 30.  IMPRESSIONS:  1. 50-70% obtuse marginal #1. Otherwise, widely patent coronary arteries.  2. Known severe left ventricular dysfunction out of proportion to the degree of coronary disease.  3. Markedly elevated  LVEDP   RECOMMENDATION:  Per heart failure team..

## 2012-11-01 DIAGNOSIS — R570 Cardiogenic shock: Secondary | ICD-10-CM

## 2012-11-01 DIAGNOSIS — I5021 Acute systolic (congestive) heart failure: Secondary | ICD-10-CM

## 2012-11-01 LAB — CBC
HCT: 32 % — ABNORMAL LOW (ref 36.0–46.0)
Hemoglobin: 10.6 g/dL — ABNORMAL LOW (ref 12.0–15.0)
MCH: 26.3 pg (ref 26.0–34.0)
MCHC: 33.1 g/dL (ref 30.0–36.0)
MCV: 79.4 fL (ref 78.0–100.0)
RDW: 14.7 % (ref 11.5–15.5)

## 2012-11-01 LAB — BASIC METABOLIC PANEL
BUN: 20 mg/dL (ref 6–23)
BUN: 18 mg/dL (ref 6–23)
Chloride: 89 mEq/L — ABNORMAL LOW (ref 96–112)
Chloride: 90 mEq/L — ABNORMAL LOW (ref 96–112)
Creatinine, Ser: 0.77 mg/dL (ref 0.50–1.10)
GFR calc Af Amer: >90 mL/min (ref >90)
GFR calc Af Amer: >90 mL/min (ref >90)
GFR calc non Af Amer: >90 mL/min (ref >90)
Glucose, Bld: 231 mg/dL — ABNORMAL HIGH (ref 70–99)
Potassium: 3 mEq/L — ABNORMAL LOW (ref 3.5–5.1)
Potassium: 2.9 mEq/L — ABNORMAL LOW (ref 3.5–5.1)
Sodium: 135 mEq/L (ref 135–145)

## 2012-11-01 LAB — GLUCOSE, CAPILLARY
Glucose-Capillary: 198 mg/dL — ABNORMAL HIGH (ref 70–99)
Glucose-Capillary: 233 mg/dL — ABNORMAL HIGH (ref 70–99)

## 2012-11-01 LAB — MAGNESIUM: Magnesium: 1.4 mg/dL — ABNORMAL LOW (ref 1.5–2.5)

## 2012-11-01 MED ORDER — SPIRONOLACTONE 12.5 MG HALF TABLET
12.5000 mg | ORAL_TABLET | Freq: Every day | ORAL | Status: DC
  Administered 2012-11-01: 12.5 mg via ORAL
  Filled 2012-11-01 (×2): qty 1

## 2012-11-01 MED ORDER — MAGNESIUM SULFATE 40 MG/ML IJ SOLN
2.0000 g | Freq: Once | INTRAMUSCULAR | Status: AC
  Administered 2012-11-01: 2 g via INTRAVENOUS
  Filled 2012-11-01: qty 50

## 2012-11-01 MED ORDER — POTASSIUM CHLORIDE 20 MEQ/15ML (10%) PO LIQD
40.0000 meq | Freq: Three times a day (TID) | ORAL | Status: DC
  Administered 2012-11-01 (×2): 40 meq via ORAL
  Filled 2012-11-01 (×5): qty 30

## 2012-11-01 MED ORDER — POTASSIUM CHLORIDE 20 MEQ/15ML (10%) PO LIQD
ORAL | Status: AC
  Administered 2012-11-01: 20 meq
  Filled 2012-11-01: qty 15

## 2012-11-01 MED ORDER — HYDROCORTISONE 1 % EX CREA
TOPICAL_CREAM | CUTANEOUS | Status: DC | PRN
  Filled 2012-11-01 (×2): qty 28

## 2012-11-01 MED ORDER — POTASSIUM CHLORIDE 20 MEQ/15ML (10%) PO LIQD: 20.0000 meq | Freq: Two times a day (BID) | ORAL | Status: DC

## 2012-11-01 MED ORDER — POTASSIUM CHLORIDE CRYS ER 20 MEQ PO TBCR
EXTENDED_RELEASE_TABLET | ORAL | Status: AC
  Administered 2012-11-01: 20 meq
  Filled 2012-11-01: qty 2

## 2012-11-01 MED ORDER — DIPHENHYDRAMINE HCL 50 MG/ML IJ SOLN: 25.0000 mg | Freq: Once | INTRAMUSCULAR | Status: DC

## 2012-11-01 MED ORDER — ENALAPRIL MALEATE 5 MG PO TABS
7.5000 mg | ORAL_TABLET | Freq: Two times a day (BID) | ORAL | Status: DC
  Administered 2012-11-01 (×2): 7.5 mg via ORAL
  Filled 2012-11-01 (×4): qty 1

## 2012-11-01 MED ORDER — POTASSIUM CHLORIDE 20 MEQ/15ML (10%) PO LIQD
40.0000 meq | Freq: Once | ORAL | Status: AC
  Administered 2012-11-01: 40 meq via ORAL
  Filled 2012-11-01: qty 30

## 2012-11-01 MED ORDER — POTASSIUM CHLORIDE 10 MEQ/50ML IV SOLN
10.0000 meq | INTRAVENOUS | Status: AC
  Administered 2012-11-01 (×2): 10 meq via INTRAVENOUS
  Filled 2012-11-01: qty 100

## 2012-11-01 MED ORDER — METOLAZONE 2.5 MG PO TABS
2.5000 mg | ORAL_TABLET | Freq: Two times a day (BID) | ORAL | Status: DC
  Administered 2012-11-01 (×2): 2.5 mg via ORAL
  Filled 2012-11-01 (×4): qty 1

## 2012-11-01 MED ORDER — DIPHENHYDRAMINE HCL 50 MG/ML IJ SOLN
25.0000 mg | Freq: Once | INTRAMUSCULAR | Status: AC
Start: 2012-11-01 — End: 2012-11-01
  Administered 2012-11-01: 25 mg via INTRAVENOUS

## 2012-11-01 MED ORDER — POTASSIUM CHLORIDE 10 MEQ/100ML IV SOLN
10.0000 meq | INTRAVENOUS | Status: DC
  Administered 2012-11-01: 10 meq via INTRAVENOUS
  Filled 2012-11-01: qty 300
  Filled 2012-11-01: qty 100

## 2012-11-01 MED ORDER — DIPHENHYDRAMINE HCL 50 MG/ML IJ SOLN
INTRAMUSCULAR | Status: AC
  Filled 2012-11-01: qty 1

## 2012-11-01 MED ORDER — INSULIN GLARGINE 100 UNIT/ML ~~LOC~~ SOLN
12.0000 [IU] | Freq: Every day | SUBCUTANEOUS | Status: DC
Start: 2012-11-01 — End: 2012-11-05
  Administered 2012-11-01 – 2012-11-04 (×4): 12 [IU] via SUBCUTANEOUS
  Filled 2012-11-01 (×5): qty 0.12

## 2012-11-01 NOTE — Progress Notes (Signed)
Advanced Heart Failure Team Rounding Note  Referring Physician: [facility] Primary Physician: [name], MD Primary Cardiologist:  [facility] Leia Alf, MD  Reason for Consultation: Low output HF  Subjective   Emily [name] is a 52 y/o woman with COPD (ongoing tobacco use), DM2, HTN, breast CA status post Adriamycin/Cytoxan/Taxol/lumpectomy/radiation (2009). She was admitted with acute HF. 8/31/14Echo with EF 20-25% and grade 2 DD.   Co-ox 47> 72>64% on Milrinone 0.25 mcg  LHC 10/31/12 50-70% obtuse marginal #1. Otherwise, widely patent coronary arteries.  Post cath she continue IV lasix and spiro/torsemide was stopped as volume status was elevated. . Weight down 2 pounds.   Denies SOB/PND/Orthopnea   Last swan numbers:  CVP 18 PA 41/23 PCWP 28 Thermo 5.8/3.1 SVR 801  Objective:    Vital Signs:   Temp:  [97.2 F (36.2 C)-98.5 F (36.9 C)] 97.7 F (36.5 C) (09/05 0600) Pulse Rate:  [75-106] 97 (09/05 0600) Resp:  [12-23] 17 (09/05 0600) BP: (87-130)/(45-79) 118/65 mmHg (09/05 0600) SpO2:  [91 %-97 %] 94 % (09/05 0600) Weight:  [175 lb 9.6 oz (79.652 kg)-177 lb 7.5 oz (80.5 kg)] 175 lb 9.6 oz (79.652 kg) (09/05 0429) Last BM Date: 11/01/12  Weight change: Filed Weights   10/31/12 0500 10/31/12 0834 11/01/12 0429  Weight: 177 lb 8 oz (80.513 kg) 177 lb 7.5 oz (80.5 kg) 175 lb 9.6 oz (79.652 kg)    Intake/Output:   Intake/Output Summary (Last 24 hours) at 11/01/12 0707 Last data filed at 11/01/12 0705  Gross per 24 hour  Intake   3077 ml  Output   3425 ml  Net   -348 ml     Physical Exam: General:  Sitting in the chair No resp difficulty HEENT: normal Neck: supple. Swan RIJ Carotids 2+ bilat; no bruits. No lymphadenopathy or thryomegaly appreciated. Cor: PMI laterally displaced. Regular rate & rhythm. 2/6 TR 2/6 MR +s3 Lungs: clear Abdomen: soft, nontender, nondistended. No hepatosplenomegaly. No bruits or masses. Good bowel sounds. Extremities: Warm. R and LLE 1+  edema. No rash.  Neuro: alert & orientedx3, cranial nerves grossly intact. moves all 4 extremities w/o difficulty. Affect pleasant  Telemetry: SR 90s  Labs: Basic Metabolic Panel:  Recent Labs Lab 10/26/12 1700 10/26/12 2125  10/29/12 0450 10/29/12 1900 10/30/12 0405 10/31/12 0839 11/01/12 0312  NA 130*  --   < > 127* 131* 131* 130* 132*  K 3.9  --   < > 5.3* 3.3* 3.5 2.7* 3.0*  CL 94*  --   < > 87* 88* 88* 88* 89*  CO2 25  --   < > 26 30 30  33* 33*  GLUCOSE 191*  --   < > 245* 285* 287* 114* 231*  BUN 14  --   < > 27* 31* 29* 20 20  CREATININE 0.62  --   < > 1.53* 1.41* 1.29* 0.78 0.77  CALCIUM 9.4  --   < > 8.9 8.3* 8.0* 8.2* 8.4  MG  --  1.5  --   --   --   --   --  1.4*  < > = values in this interval not displayed.   Liver Function Tests:  Recent Labs Lab 10/26/12 1700 10/30/12 0405  AST 35 26  ALT 16 13  ALKPHOS 44 35*  BILITOT 0.9 0.9  PROT 6.9 5.9*  ALBUMIN 3.4* 3.0*    Recent Labs Lab 10/26/12 1700  LIPASE 18   No results found for this basename: AMMONIA,  in the  last 168 hours  CBC:  Recent Labs Lab 10/26/12 1700 10/30/12 0405 11/01/12 0312  WBC 10.2 12.2* 8.4  NEUTROABS 6.4  --   --   HGB 12.6 10.8* 10.6*  HCT 38.6 31.6* 32.0*  MCV 81.8 77.8* 79.4  PLT 282 165 136*    Cardiac Enzymes:  Recent Labs Lab 10/26/12 1700 10/26/12 2125 10/27/12 0235  TROPONINI <0.30 <0.30 <0.30     BNP: BNP (last 3 results)  Recent Labs  10/26/12 1700  PROBNP 3089.0*    CBG:  Recent Labs Lab 10/30/12 2233 10/31/12 0830 10/31/12 1137 10/31/12 1711 10/31/12 2136  GLUCAP 204* 104* 164* 286* 330*    Coagulation Studies:  Recent Labs  10/31/12 1044  LABPROT 17.1*  INR 1.43    Other results:    Imaging: No results found.   Medications:     Current Medications: . ipratropium  0.5 mg Nebulization BID   And  . albuterol  2.5 mg Nebulization BID  . aspirin EC  81 mg Oral Daily  . budesonide (PULMICORT) nebulizer solution   0.25 mg Nebulization BID  . calcium-vitamin D  1 tablet Oral BID  . digoxin  0.125 mg Oral Daily  . enalapril  5 mg Oral BID  . enoxaparin (LOVENOX) injection  40 mg Subcutaneous QHS  . ferrous sulfate  325 mg Oral QHS  . furosemide  80 mg Intravenous BID  . gabapentin  800 mg Oral TID  . insulin aspart  0-15 Units Subcutaneous TID WC  . insulin aspart  0-5 Units Subcutaneous QHS  . insulin aspart  4 Units Subcutaneous TID WC  . insulin glargine  10 Units Subcutaneous QHS  . nicotine  21 mg Transdermal Daily  . pantoprazole  40 mg Oral Daily  . potassium chloride  10 mEq Intravenous Q1 Hr x 2  . sertraline  100 mg Oral QHS    Infusions: . sodium chloride 14 mL/hr at 10/31/12 2000  . sodium chloride 10 mL/hr at 10/31/12 2000  . milrinone 0.25 mcg/kg/min (11/01/12 0148)     Assessment:   1. Acute systolic HF - suspect due to adriamycin toxicity     --EF 20-25% 2. Cardiogenic shock 3. Breast CA     --status post Adriamycin/Cytoxan/Taxol/lumpectomy/radiation, 2009 4. DM2 5. COPD 6. H/O HTN 7. Hyponatremia/Hypokalemia 8. Hypomagnesium     Plan/Discussion:   Volume status improving but remains elevated. Weight down 2 pounds. Continue IV lasix 80 mg bid. Continue Milrinone 0.25 mcg. Hold off on carvedilol. Continue enalapril 5 mg bid. Renal function ok.  Add ted hose.   K Supplemented. Repeat BMET at 11:00. Add 12.5 mg spironolactone. Add 20 meq Kdur bid  Magnesium 1.4. Give 2 grams Magnesium now. Check Magnesium tomorrow.     Emily [name],AMY,NP-C 7:07 AM  Advanced Heart Failure Team Pager [phone] (M-F; 7a - 4p)  Please contact [facility] for night-coverage after hours (4p -7a ) and weekends on [email or web address]  Patient seen and examined with Emily Becket, NP. We discussed all aspects of the encounter. I agree with the assessment and plan as stated above. See my note from today for other details.  Emily Mirkin,MD 12:00 AM

## 2012-11-01 NOTE — Progress Notes (Signed)
Advanced Heart Failure Team Rounding Note  Referring Physician: [facility] Primary Physician: [name], MD Primary Cardiologist:  [facility] Leia Alf, MD  Reason for Consultation: Low output HF  Subjective   Emily [name] is a 52 y/o woman with COPD (ongoing tobacco use), DM2, HTN, breast CA status post Adriamycin/Cytoxan/Taxol/lumpectomy/radiation (2009). She was admitted with acute HF. Echo with EF 20-25% and grade 2 DD.   Cath yesterday with nonobstructive CAD. LVEDP ~30. IV lasix restarted.  Feels better. Sitting in chair. No dyspnea or orthopnea. Tolerating low-dose carvedilol and enalapril. Weight down two pounds.   Co-ox 47% -> 72% -> 64%  Last swan numbers:  CVP 18 PA 41/23 PCWP 28 Thermo 5.8/3.1 SVR 801  Objective:    Vital Signs:   Temp:  [97 F (36.1 C)-98.5 F (36.9 C)] 97.2 F (36.2 C) (09/05 0800) Pulse Rate:  [75-106] 95 (09/05 0800) Resp:  [12-23] 22 (09/05 0800) BP: (98-130)/(51-79) 109/71 mmHg (09/05 0800) SpO2:  [91 %-97 %] 95 % (09/05 0800) Weight:  [79.652 kg (175 lb 9.6 oz)] 79.652 kg (175 lb 9.6 oz) (09/05 0429) Last BM Date: 11/01/12  Weight change: Filed Weights   10/31/12 0500 10/31/12 0834 11/01/12 0429  Weight: 80.513 kg (177 lb 8 oz) 80.5 kg (177 lb 7.5 oz) 79.652 kg (175 lb 9.6 oz)    Intake/Output:   Intake/Output Summary (Last 24 hours) at 11/01/12 0859 Last data filed at 11/01/12 0800  Gross per 24 hour  Intake   2581 ml  Output   3225 ml  Net   -644 ml     Physical Exam: General: Sitting in chair. No resp difficulty HEENT: normal Neck: supple. Swan RIJ Carotids 2+ bilat; no bruits. No lymphadenopathy or thryomegaly appreciated. Cor: PMI laterally displaced. Regular rate & rhythm. 2/6 TR 2/6 MR +s3 Lungs: clear Abdomen: soft, nontender, nondistended. No hepatosplenomegaly. No bruits or masses. Good bowel sounds. Extremities: Warm. Tr edema. No rash.  Neuro: alert & orientedx3, cranial nerves grossly intact. moves all 4  extremities w/o difficulty. Affect pleasant  Telemetry: SR 90s  Labs: Basic Metabolic Panel:  Recent Labs Lab 10/26/12 1700 10/26/12 2125  10/29/12 0450 10/29/12 1900 10/30/12 0405 10/31/12 0839 11/01/12 0312  NA 130*  --   < > 127* 131* 131* 130* 132*  K 3.9  --   < > 5.3* 3.3* 3.5 2.7* 3.0*  CL 94*  --   < > 87* 88* 88* 88* 89*  CO2 25  --   < > 26 30 30  33* 33*  GLUCOSE 191*  --   < > 245* 285* 287* 114* 231*  BUN 14  --   < > 27* 31* 29* 20 20  CREATININE 0.62  --   < > 1.53* 1.41* 1.29* 0.78 0.77  CALCIUM 9.4  --   < > 8.9 8.3* 8.0* 8.2* 8.4  MG  --  1.5  --   --   --   --   --  1.4*  < > = values in this interval not displayed.  Liver Function Tests:  Recent Labs Lab 10/26/12 1700 10/30/12 0405  AST 35 26  ALT 16 13  ALKPHOS 44 35*  BILITOT 0.9 0.9  PROT 6.9 5.9*  ALBUMIN 3.4* 3.0*    Recent Labs Lab 10/26/12 1700  LIPASE 18   No results found for this basename: AMMONIA,  in the last 168 hours  CBC:  Recent Labs Lab 10/26/12 1700 10/30/12 0405 11/01/12 0312  WBC 10.2 12.2*  8.4  NEUTROABS 6.4  --   --   HGB 12.6 10.8* 10.6*  HCT 38.6 31.6* 32.0*  MCV 81.8 77.8* 79.4  PLT 282 165 136*    Cardiac Enzymes:  Recent Labs Lab 10/26/12 1700 10/26/12 2125 10/27/12 0235  TROPONINI <0.30 <0.30 <0.30     BNP: BNP (last 3 results)  Recent Labs  10/26/12 1700  PROBNP 3089.0*    CBG:  Recent Labs Lab 10/31/12 0830 10/31/12 1137 10/31/12 1711 10/31/12 2136 11/01/12 0803  GLUCAP 104* 164* 286* 330* 198*    Coagulation Studies:  Recent Labs  10/31/12 1044  LABPROT 17.1*  INR 1.43    Other results:    Imaging: No results found.   Medications:     Current Medications: . ipratropium  0.5 mg Nebulization BID   And  . albuterol  2.5 mg Nebulization BID  . aspirin EC  81 mg Oral Daily  . budesonide (PULMICORT) nebulizer solution  0.25 mg Nebulization BID  . calcium-vitamin D  1 tablet Oral BID  . digoxin  0.125 mg  Oral Daily  . enalapril  5 mg Oral BID  . enoxaparin (LOVENOX) injection  40 mg Subcutaneous QHS  . ferrous sulfate  325 mg Oral QHS  . furosemide  80 mg Intravenous BID  . gabapentin  800 mg Oral TID  . insulin aspart  0-15 Units Subcutaneous TID WC  . insulin aspart  0-5 Units Subcutaneous QHS  . insulin aspart  4 Units Subcutaneous TID WC  . insulin glargine  10 Units Subcutaneous QHS  . magnesium sulfate 1 - 4 g bolus IVPB  2 g Intravenous Once  . nicotine  21 mg Transdermal Daily  . pantoprazole  40 mg Oral Daily  . potassium chloride  20 mEq Oral BID  . sertraline  100 mg Oral QHS  . spironolactone  12.5 mg Oral Daily    Infusions: . sodium chloride 14 mL/hr at 10/31/12 2000  . sodium chloride 10 mL/hr at 10/31/12 2000  . milrinone 0.25 mcg/kg/min (11/01/12 0148)     Assessment:   1. Acute systolic HF - suspect due to adriamycin toxicity     --EF 20-25% 2. Cardiogenic shock 3. Breast CA     --status post Adriamycin/Cytoxan/Taxol/lumpectomy/radiation, 2009 4. DM2 5. COPD 6. H/o HTN 7. Hyponatremia/Hypokalemia     Plan/Discussion:    She is improving slowly but volume status remains elevated. Will continue IV lasix and give metolazone x 2 doses. Add spiro. Titrate enalapril gently. Continue milrinone.     [name] 8:59 AM  Advanced Heart Failure Team Pager [phone] (M-F; 7a - 4p)  Please contact [facility] for night-coverage after hours (4p -7a ) and weekends on [email or web address]

## 2012-11-01 NOTE — Progress Notes (Addendum)
[facility] PROGRESS NOTE  Emily [name] ZOX:[id number] DOB: [birth year] DOA: 10/26/2012 PCP: [name], MD  Assessment/Plan: 1. Acute decompensated systolic HF, cardiogenic shock  --EF 20-25%; I/O neg 4L; Initial CVP 30; PCWP ~25; PA sat 47%  -off IV lasix drip; + milrinone -- IV lasix -cont dig,  - LHC   -per HF team  2. DM2; A1c 9.6; home metformin+gli[pize  -d/c metformin due to AKI  -increase lantus  QHS; +SSI+ novolog with meals   3. AKI, hypo Na likely due to cardiogenic shock +/- diuretics  -close monitor renal function on diuresis  -decreasing  4. COPD, smoker  -currently stable, tobacco cessation counseleing  - Duonebs and pulmicort   5. Abdominal pain and distension,  -due to volume overload and abd wall edema  -improved, tolerating diet  - Abd [place] without acsites   6. Breast CA  --status post Adriamycin/Cytoxan/Taxol/lumpectomy/radiation, 2009   7. Hypokalemia -replete   Code Status: full Family Communication: patient Disposition Plan: SDU   Consultants:  cards  Procedures:    Antibiotics:    HPI/Subjective: No complaints, up standing by bed  Objective: Filed Vitals:   11/01/12 0800  BP: 109/71  Pulse: 95  Temp: 97.2 F (36.2 C)  Resp: 22    Intake/Output Summary (Last 24 hours) at 11/01/12 0904 Last data filed at 11/01/12 0800  Gross per 24 hour  Intake   2541 ml  Output   3225 ml  Net   -684 ml   Filed Weights   10/31/12 0500 10/31/12 0834 11/01/12 0429  Weight: 80.513 kg (177 lb 8 oz) 80.5 kg (177 lb 7.5 oz) 79.652 kg (175 lb 9.6 oz)    Exam:  General: Chronically ill appearing. No resp difficulty  Neck: supple. JVD to ear.  Heart: Regular rate & rhythm. 2/6 TR 2/6 MR +s3  Lungs: clear  Abdomen: soft, nontender, nondistended. No hepatosplenomegaly. No bruits or masses. Good bowel sounds.  Neuro: alert & orientedx3, cranial nerves grossly intact. moves all 4 extremities w/o difficulty.   Data  Reviewed: Basic Metabolic Panel:  Recent Labs Lab 10/26/12 1700 10/26/12 2125  10/29/12 0450 10/29/12 1900 10/30/12 0405 10/31/12 0839 11/01/12 0312  NA 130*  --   < > 127* 131* 131* 130* 132*  K 3.9  --   < > 5.3* 3.3* 3.5 2.7* 3.0*  CL 94*  --   < > 87* 88* 88* 88* 89*  CO2 25  --   < > 26 30 30  33* 33*  GLUCOSE 191*  --   < > 245* 285* 287* 114* 231*  BUN 14  --   < > 27* 31* 29* 20 20  CREATININE 0.62  --   < > 1.53* 1.41* 1.29* 0.78 0.77  CALCIUM 9.4  --   < > 8.9 8.3* 8.0* 8.2* 8.4  MG  --  1.5  --   --   --   --   --  1.4*  < > = values in this interval not displayed. Liver Function Tests:  Recent Labs Lab 10/26/12 1700 10/30/12 0405  AST 35 26  ALT 16 13  ALKPHOS 44 35*  BILITOT 0.9 0.9  PROT 6.9 5.9*  ALBUMIN 3.4* 3.0*    Recent Labs Lab 10/26/12 1700  LIPASE 18   No results found for this basename: AMMONIA,  in the last 168 hours CBC:  Recent Labs Lab 10/26/12 1700 10/30/12 0405 11/01/12 0312  WBC 10.2 12.2* 8.4  NEUTROABS 6.4  --   --  HGB 12.6 10.8* 10.6*  HCT 38.6 31.6* 32.0*  MCV 81.8 77.8* 79.4  PLT 282 165 136*   Cardiac Enzymes:  Recent Labs Lab 10/26/12 1700 10/26/12 2125 10/27/12 0235  TROPONINI <0.30 <0.30 <0.30   BNP (last 3 results)  Recent Labs  10/26/12 1700  PROBNP 3089.0*   CBG:  Recent Labs Lab 10/31/12 0830 10/31/12 1137 10/31/12 1711 10/31/12 2136 11/01/12 0803  GLUCAP 104* 164* 286* 330* 198*    Recent Results (from the past 240 hour(s))  MRSA PCR SCREENING     Status: None   Collection Time    10/29/12 12:40 PM      Result Value Range Status   MRSA by PCR NEGATIVE  NEGATIVE Final   Comment:            The GeneXpert MRSA Assay (FDA     approved for NASAL specimens     only), is one component of a     comprehensive MRSA colonization     surveillance program. It is not     intended to diagnose MRSA     infection nor to guide or     monitor treatment for     MRSA infections.      Studies: No results found.  Scheduled Meds: . ipratropium  0.5 mg Nebulization BID   And  . albuterol  2.5 mg Nebulization BID  . aspirin EC  81 mg Oral Daily  . budesonide (PULMICORT) nebulizer solution  0.25 mg Nebulization BID  . calcium-vitamin D  1 tablet Oral BID  . digoxin  0.125 mg Oral Daily  . enalapril  5 mg Oral BID  . enoxaparin (LOVENOX) injection  40 mg Subcutaneous QHS  . ferrous sulfate  325 mg Oral QHS  . furosemide  80 mg Intravenous BID  . gabapentin  800 mg Oral TID  . insulin aspart  0-15 Units Subcutaneous TID WC  . insulin aspart  0-5 Units Subcutaneous QHS  . insulin aspart  4 Units Subcutaneous TID WC  . insulin glargine  10 Units Subcutaneous QHS  . magnesium sulfate 1 - 4 g bolus IVPB  2 g Intravenous Once  . nicotine  21 mg Transdermal Daily  . pantoprazole  40 mg Oral Daily  . potassium chloride  20 mEq Oral BID  . sertraline  100 mg Oral QHS  . spironolactone  12.5 mg Oral Daily   Continuous Infusions: . sodium chloride 14 mL/hr at 10/31/12 2000  . sodium chloride 10 mL/hr at 10/31/12 2000  . milrinone 0.25 mcg/kg/min (11/01/12 0148)    Principal Problem:   Acute exacerbation of CHF (congestive heart failure) Active Problems:   ADENOCARCINOMA, BREAST   DM   DM type 2 (diabetes mellitus, type 2)   Dyspnea   GERD (gastroesophageal reflux disease)   Cigarette nicotine dependence, uncomplicated   Arthritis   Iron deficiency anemia   Dyslipidemia   Insomnia   Abdominal pain   Acute systolic CHF (congestive heart failure)   Cardiogenic shock    Time spent: 35    [facility], Kathie Posa  [facility] Pager [phone]. If 7PM-7AM, please contact night-coverage at [email or web address], password [facility] 11/01/2012, 9:04 AM  LOS: 6 days

## 2012-11-01 NOTE — Progress Notes (Signed)
Troubled by insomnia. No cath site complications.

## 2012-11-02 LAB — CARBOXYHEMOGLOBIN
Carboxyhemoglobin: 1.8 % — ABNORMAL HIGH (ref 0.5–1.5)
Methemoglobin: 0.6 % (ref 0.0–1.5)
O2 Saturation: 66.2 %
Total hemoglobin: 10.6 g/dL — ABNORMAL LOW (ref 12.0–16.0)

## 2012-11-02 LAB — BASIC METABOLIC PANEL
Chloride: 91 mEq/L — ABNORMAL LOW (ref 96–112)
GFR calc Af Amer: >90 mL/min (ref >90)
Potassium: 3.4 mEq/L — ABNORMAL LOW (ref 3.5–5.1)
Sodium: 134 mEq/L — ABNORMAL LOW (ref 135–145)

## 2012-11-02 LAB — GLUCOSE, CAPILLARY: Glucose-Capillary: 277 mg/dL — ABNORMAL HIGH (ref 70–99)

## 2012-11-02 MED ORDER — METOLAZONE 2.5 MG PO TABS
2.5000 mg | ORAL_TABLET | Freq: Once | ORAL | Status: AC
  Administered 2012-11-02: 2.5 mg via ORAL
  Filled 2012-11-02: qty 1

## 2012-11-02 MED ORDER — ENALAPRIL MALEATE 10 MG PO TABS
10.0000 mg | ORAL_TABLET | Freq: Two times a day (BID) | ORAL | Status: DC
  Administered 2012-11-02 (×2): 10 mg via ORAL
  Filled 2012-11-02 (×4): qty 1

## 2012-11-02 MED ORDER — SPIRONOLACTONE 25 MG PO TABS
25.0000 mg | ORAL_TABLET | Freq: Every day | ORAL | Status: DC
  Administered 2012-11-02 – 2012-11-05 (×4): 25 mg via ORAL
  Filled 2012-11-02 (×4): qty 1

## 2012-11-02 MED ORDER — FUROSEMIDE 40 MG PO TABS
40.0000 mg | ORAL_TABLET | Freq: Two times a day (BID) | ORAL | Status: DC
  Administered 2012-11-02: 40 mg via ORAL
  Filled 2012-11-02 (×3): qty 1

## 2012-11-02 MED ORDER — FUROSEMIDE 10 MG/ML IJ SOLN
80.0000 mg | Freq: Once | INTRAMUSCULAR | Status: AC
  Administered 2012-11-02: 80 mg via INTRAVENOUS

## 2012-11-02 MED ORDER — POTASSIUM CHLORIDE 20 MEQ/15ML (10%) PO LIQD
40.0000 meq | Freq: Two times a day (BID) | ORAL | Status: DC
  Administered 2012-11-02 – 2012-11-03 (×3): 40 meq via ORAL
  Filled 2012-11-02 (×6): qty 30

## 2012-11-02 MED ORDER — INSULIN ASPART 100 UNIT/ML ~~LOC~~ SOLN
5.0000 [IU] | Freq: Three times a day (TID) | SUBCUTANEOUS | Status: DC
  Administered 2012-11-02 – 2012-11-05 (×8): 5 [IU] via SUBCUTANEOUS

## 2012-11-02 MED ORDER — MAGNESIUM SULFATE 40 MG/ML IJ SOLN
2.0000 g | Freq: Once | INTRAMUSCULAR | Status: AC
  Administered 2012-11-02: 2 g via INTRAVENOUS
  Filled 2012-11-02: qty 50

## 2012-11-02 NOTE — Progress Notes (Signed)
[facility] PROGRESS NOTE  Emily [name] BJY:[id number] DOB: [birth year] DOA: 10/26/2012 PCP: [name], MD  Assessment/Plan: 1. Acute decompensated systolic HF, cardiogenic shock  --EF 20-25%; I/O neg 4L; Initial CVP 30; PCWP ~25; PA sat 47%  -off IV lasix drip; + milrinone -- IV lasix -cont dig,  - LHC   -per HF team  2. DM2; A1c 9.6; home metformin+gli[pize  -d/c metformin due to AKI  -increase lantus  QHS; +SSI+ novolog with meals On 9/6 her FBS had improved   3. AKI, hypo Na likely due to cardiogenic shock +/- diuretics  -close monitor renal function on diuresis  -decreasing  4. COPD, smoker  -currently stable, tobacco cessation counseleing  - Duonebs and pulmicort   5. Abdominal pain and distension,  -due to volume overload and abd wall edema  -improved, tolerating diet  - Abd [place] without acsites   6. Breast CA  --status post Adriamycin/Cytoxan/Taxol/lumpectomy/radiation, 2009   7. Hypokalemia -replete   Code Status: full Family Communication: patient Disposition Plan: SDU- plan per HF team   Consultants:  cards  Procedures:    Antibiotics:    HPI/Subjective: No new c/o  Objective: Filed Vitals:   11/02/12 0800  BP: 106/68  Pulse: 98  Temp: 98.1 F (36.7 C)  Resp: 23    Intake/Output Summary (Last 24 hours) at 11/02/12 0816 Last data filed at 11/02/12 0700  Gross per 24 hour  Intake   1860 ml  Output   6175 ml  Net  -4315 ml   Filed Weights   10/31/12 0834 11/01/12 0429 11/02/12 0500  Weight: 80.5 kg (177 lb 7.5 oz) 79.652 kg (175 lb 9.6 oz) 76.6 kg (168 lb 14 oz)    Exam:  General: NAD. No resp difficulty  Neck: supple. JVD to ear.  Heart: Regular rate & rhythm. 2/6 TR 2/6 MR +s3  Lungs: clear  Abdomen: soft, nontender, nondistended. No hepatosplenomegaly. No bruits or masses. Good bowel sounds.  Neuro: alert & orientedx3, cranial nerves grossly intact. moves all 4 extremities w/o difficulty.   Data  Reviewed: Basic Metabolic Panel:  Recent Labs Lab 10/26/12 1700 10/26/12 2125  10/30/12 0405 10/31/12 0839 11/01/12 0312 11/01/12 1230 11/01/12 2000 11/02/12 0403  NA 130*  --   < > 131* 130* 132* 135  --  134*  K 3.9  --   < > 3.5 2.7* 3.0* 2.9* 3.9 3.4*  CL 94*  --   < > 88* 88* 89* 90*  --  91*  CO2 25  --   < > 30 33* 33* 35*  --  35*  GLUCOSE 191*  --   < > 287* 114* 231* 228*  --  170*  BUN 14  --   < > 29* 20 20 18   --  19  CREATININE 0.62  --   < > 1.29* 0.78 0.77 0.71  --  0.82  CALCIUM 9.4  --   < > 8.0* 8.2* 8.4 8.5  --  9.0  MG  --  1.5  --   --   --  1.4*  --   --   --   < > = values in this interval not displayed. Liver Function Tests:  Recent Labs Lab 10/26/12 1700 10/30/12 0405  AST 35 26  ALT 16 13  ALKPHOS 44 35*  BILITOT 0.9 0.9  PROT 6.9 5.9*  ALBUMIN 3.4* 3.0*    Recent Labs Lab 10/26/12 1700  LIPASE 18   No results found  for this basename: AMMONIA,  in the last 168 hours CBC:  Recent Labs Lab 10/26/12 1700 10/30/12 0405 11/01/12 0312  WBC 10.2 12.2* 8.4  NEUTROABS 6.4  --   --   HGB 12.6 10.8* 10.6*  HCT 38.6 31.6* 32.0*  MCV 81.8 77.8* 79.4  PLT 282 165 136*   Cardiac Enzymes:  Recent Labs Lab 10/26/12 1700 10/26/12 2125 10/27/12 0235  TROPONINI <0.30 <0.30 <0.30   BNP (last 3 results)  Recent Labs  10/26/12 1700  PROBNP 3089.0*   CBG:  Recent Labs Lab 11/01/12 0803 11/01/12 1210 11/01/12 1735 11/01/12 2110 11/02/12 0743  GLUCAP 198* 233* 208* 216* 120*    Recent Results (from the past 240 hour(s))  MRSA PCR SCREENING     Status: None   Collection Time    10/29/12 12:40 PM      Result Value Range Status   MRSA by PCR NEGATIVE  NEGATIVE Final   Comment:            The GeneXpert MRSA Assay (FDA     approved for NASAL specimens     only), is one component of a     comprehensive MRSA colonization     surveillance program. It is not     intended to diagnose MRSA     infection nor to guide or      monitor treatment for     MRSA infections.     Studies: No results found.  Scheduled Meds: . ipratropium  0.5 mg Nebulization BID   And  . albuterol  2.5 mg Nebulization BID  . aspirin EC  81 mg Oral Daily  . budesonide (PULMICORT) nebulizer solution  0.25 mg Nebulization BID  . calcium-vitamin D  1 tablet Oral BID  . digoxin  0.125 mg Oral Daily  . enalapril  7.5 mg Oral BID  . enoxaparin (LOVENOX) injection  40 mg Subcutaneous QHS  . ferrous sulfate  325 mg Oral QHS  . furosemide  80 mg Intravenous BID  . gabapentin  800 mg Oral TID  . insulin aspart  0-15 Units Subcutaneous TID WC  . insulin aspart  0-5 Units Subcutaneous QHS  . insulin aspart  4 Units Subcutaneous TID WC  . insulin glargine  12 Units Subcutaneous QHS  . metolazone  2.5 mg Oral BID  . nicotine  21 mg Transdermal Daily  . pantoprazole  40 mg Oral Daily  . potassium chloride  40 mEq Oral TID  . sertraline  100 mg Oral QHS  . spironolactone  12.5 mg Oral Daily   Continuous Infusions: . sodium chloride 14 mL/hr at 10/31/12 2000  . sodium chloride 10 mL/hr at 10/31/12 2000  . milrinone 0.25 mcg/kg/min (11/01/12 2009)    Principal Problem:   Acute exacerbation of CHF (congestive heart failure) Active Problems:   ADENOCARCINOMA, BREAST   DM   DM type 2 (diabetes mellitus, type 2)   Dyspnea   GERD (gastroesophageal reflux disease)   Cigarette nicotine dependence, uncomplicated   Arthritis   Iron deficiency anemia   Dyslipidemia   Insomnia   Abdominal pain   Acute systolic CHF (congestive heart failure)   Cardiogenic shock    Time spent: 35    [facility], Kenderick Kobler  [facility] Pager [phone]. If 7PM-7AM, please contact night-coverage at [email or web address], password [facility] 11/02/2012, 8:16 AM  LOS: 7 days

## 2012-11-02 NOTE — Progress Notes (Signed)
Advanced Heart Failure Team Rounding Note  Referring Physician: [facility] Primary Physician: [name], MD Primary Cardiologist:  [facility] Emily Alf, MD  Reason for Consultation: Low output HF  Subjective   [name] is a 52 y/o woman with COPD (ongoing tobacco use), DM2, HTN, breast CA status post Adriamycin/Cytoxan/Taxol/lumpectomy/radiation (2009). She was admitted with acute HF. Echo with EF 20-25% and grade 2 DD.   Cath yesterday with nonobstructive CAD. LVEDP ~30. IV lasix restarted.  Feels good. Ambulating room with Emily [name]. Remains on milrinone. Excellent diuresis yesterday weight down 7 pounds Renal function improved. K 3.4  Co-ox 47% -> 72% -> 64% >66%  Swan numbers this am (done personally) CVP 12 PA 35/21 (26) PCWP 19-20 Fick 66%  Objective:    Vital Signs:   Temp:  [97.3 F (36.3 C)-99 F (37.2 C)] 98.1 F (36.7 C) (09/06 0800) Pulse Rate:  [91-105] 98 (09/06 0800) Resp:  [17-28] 23 (09/06 0800) BP: (86-138)/(53-85) 106/68 mmHg (09/06 0800) SpO2:  [93 %-98 %] 94 % (09/06 0800) Weight:  [76.6 kg (168 lb 14 oz)] 76.6 kg (168 lb 14 oz) (09/06 0500) Last BM Date: 11/01/12  Weight change: Filed Weights   10/31/12 0834 11/01/12 0429 11/02/12 0500  Weight: 80.5 kg (177 lb 7.5 oz) 79.652 kg (175 lb 9.6 oz) 76.6 kg (168 lb 14 oz)    Intake/Output:   Intake/Output Summary (Last 24 hours) at 11/02/12 0812 Last data filed at 11/02/12 0700  Gross per 24 hour  Intake   1860 ml  Output   6175 ml  Net  -4315 ml     Physical Exam: General: Sitting on side of bed No resp difficulty HEENT: normal Neck: supple. Swan RIJ Carotids 2+ bilat; no bruits. No lymphadenopathy or thryomegaly appreciated. Cor: PMI laterally displaced. Regular rate & rhythm. 2/6 TR 2/6 MR +s3 Lungs: clear Abdomen: soft, nontender, nondistended. No hepatosplenomegaly. No bruits or masses. Good bowel sounds. Extremities: Warm. No edema. No rash.  Neuro: alert & orientedx3, cranial nerves grossly  intact. moves all 4 extremities w/o difficulty. Affect pleasant  Telemetry: SR 90s  Labs: Basic Metabolic Panel:  Recent Labs Lab 10/26/12 1700 10/26/12 2125  10/30/12 0405 10/31/12 0839 11/01/12 0312 11/01/12 1230 11/01/12 2000 11/02/12 0403  NA 130*  --   < > 131* 130* 132* 135  --  134*  K 3.9  --   < > 3.5 2.7* 3.0* 2.9* 3.9 3.4*  CL 94*  --   < > 88* 88* 89* 90*  --  91*  CO2 25  --   < > 30 33* 33* 35*  --  35*  GLUCOSE 191*  --   < > 287* 114* 231* 228*  --  170*  BUN 14  --   < > 29* 20 20 18   --  19  CREATININE 0.62  --   < > 1.29* 0.78 0.77 0.71  --  0.82  CALCIUM 9.4  --   < > 8.0* 8.2* 8.4 8.5  --  9.0  MG  --  1.5  --   --   --  1.4*  --   --   --   < > = values in this interval not displayed.  Liver Function Tests:  Recent Labs Lab 10/26/12 1700 10/30/12 0405  AST 35 26  ALT 16 13  ALKPHOS 44 35*  BILITOT 0.9 0.9  PROT 6.9 5.9*  ALBUMIN 3.4* 3.0*    Recent Labs Lab 10/26/12 1700  LIPASE  18   No results found for this basename: AMMONIA,  in the last 168 hours  CBC:  Recent Labs Lab 10/26/12 1700 10/30/12 0405 11/01/12 0312  WBC 10.2 12.2* 8.4  NEUTROABS 6.4  --   --   HGB 12.6 10.8* 10.6*  HCT 38.6 31.6* 32.0*  MCV 81.8 77.8* 79.4  PLT 282 165 136*    Cardiac Enzymes:  Recent Labs Lab 10/26/12 1700 10/26/12 2125 10/27/12 0235  TROPONINI <0.30 <0.30 <0.30     BNP: BNP (last 3 results)  Recent Labs  10/26/12 1700  PROBNP 3089.0*    CBG:  Recent Labs Lab 11/01/12 0803 11/01/12 1210 11/01/12 1735 11/01/12 2110 11/02/12 0743  GLUCAP 198* 233* 208* 216* 120*    Coagulation Studies:  Recent Labs  10/31/12 1044  LABPROT 17.1*  INR 1.43    Other results:    Imaging: No results found.   Medications:     Current Medications: . ipratropium  0.5 mg Nebulization BID   And  . albuterol  2.5 mg Nebulization BID  . aspirin EC  81 mg Oral Daily  . budesonide (PULMICORT) nebulizer solution  0.25 mg  Nebulization BID  . calcium-vitamin D  1 tablet Oral BID  . digoxin  0.125 mg Oral Daily  . enalapril  7.5 mg Oral BID  . enoxaparin (LOVENOX) injection  40 mg Subcutaneous QHS  . ferrous sulfate  325 mg Oral QHS  . furosemide  80 mg Intravenous BID  . gabapentin  800 mg Oral TID  . insulin aspart  0-15 Units Subcutaneous TID WC  . insulin aspart  0-5 Units Subcutaneous QHS  . insulin aspart  4 Units Subcutaneous TID WC  . insulin glargine  12 Units Subcutaneous QHS  . metolazone  2.5 mg Oral BID  . nicotine  21 mg Transdermal Daily  . pantoprazole  40 mg Oral Daily  . potassium chloride  40 mEq Oral TID  . sertraline  100 mg Oral QHS  . spironolactone  12.5 mg Oral Daily    Infusions: . sodium chloride 14 mL/hr at 10/31/12 2000  . sodium chloride 10 mL/hr at 10/31/12 2000  . milrinone 0.25 mcg/kg/min (11/01/12 2009)     Assessment:   1. Acute systolic HF - suspect due to adriamycin toxicity     --EF 20-25% 2. Cardiogenic shock 3. Breast CA     --status post Adriamycin/Cytoxan/Taxol/lumpectomy/radiation, 2009 4. DM2 5. COPD 6. H/o HTN 7. Hyponatremia/Hypokalemia     Plan/Discussion:    She is improving. Swan numbers better. Still diuresing. Will continue IV lasix and metolazone for one more dose Titrate enalapril gently. May start milrinone wean tomorrow.    Shay Jhaveri,MD 8:12 AM  Advanced Heart Failure Team Pager [phone] (M-F; 7a - 4p)  Please contact [facility] for night-coverage after hours (4p -7a ) and weekends on [email or web address]

## 2012-11-03 DIAGNOSIS — R570 Cardiogenic shock: Secondary | ICD-10-CM

## 2012-11-03 LAB — CARBOXYHEMOGLOBIN
Carboxyhemoglobin: 2.5 % — ABNORMAL HIGH (ref 0.5–1.5)
Methemoglobin: 1.4 % (ref 0.0–1.5)
O2 Saturation: 82.9 %

## 2012-11-03 LAB — BASIC METABOLIC PANEL
BUN: 16 mg/dL (ref 6–23)
Chloride: 88 mEq/L — ABNORMAL LOW (ref 96–112)
Glucose, Bld: 156 mg/dL — ABNORMAL HIGH (ref 70–99)
Potassium: 3 mEq/L — ABNORMAL LOW (ref 3.5–5.1)
Sodium: 134 mEq/L — ABNORMAL LOW (ref 135–145)

## 2012-11-03 LAB — GLUCOSE, CAPILLARY
Glucose-Capillary: 196 mg/dL — ABNORMAL HIGH (ref 70–99)
Glucose-Capillary: 209 mg/dL — ABNORMAL HIGH (ref 70–99)

## 2012-11-03 MED ORDER — POTASSIUM CHLORIDE CRYS ER 20 MEQ PO TBCR
40.0000 meq | EXTENDED_RELEASE_TABLET | Freq: Once | ORAL | Status: AC
  Administered 2012-11-03: 40 meq via ORAL
  Filled 2012-11-03: qty 2

## 2012-11-03 MED ORDER — FUROSEMIDE 40 MG PO TABS
40.0000 mg | ORAL_TABLET | Freq: Every day | ORAL | Status: DC
  Administered 2012-11-03: 40 mg via ORAL
  Filled 2012-11-03: qty 1

## 2012-11-03 MED ORDER — ENALAPRIL MALEATE 5 MG PO TABS
15.0000 mg | ORAL_TABLET | Freq: Two times a day (BID) | ORAL | Status: DC
  Administered 2012-11-03 – 2012-11-04 (×4): 15 mg via ORAL
  Filled 2012-11-03 (×7): qty 1

## 2012-11-03 MED ORDER — MILRINONE IN DEXTROSE 20 MG/100ML IV SOLN: 0.1250 ug/kg/min | INTRAVENOUS | Status: DC

## 2012-11-03 NOTE — Progress Notes (Signed)
[facility] PROGRESS NOTE  Emily [name] ZOX:[id number] DOB: 11-14-1960 DOA: 10/26/2012 PCP: [name], MD  Assessment/Plan: 1. Acute decompensated systolic HF, cardiogenic shock  --EF 20-25%; I/O neg 4L; Initial CVP 30; PCWP ~25; PA sat 47%  -off IV lasix drip; + milrinone -- IV lasix -cont dig,  - LHC   -per HF team  2. DM2; A1c 9.6; home metformin+gli[pize  -d/c metformin due to AKI  -increase lantus  QHS; +SSI+ novolog with meals On 9/6 her FBS had improved   3. AKI, hypo Na likely due to cardiogenic shock +/- diuretics  -close monitor renal function on diuresis  -decreasing  4. COPD, smoker  -currently stable, tobacco cessation counseleing  - Duonebs and pulmicort   5. Abdominal pain and distension,  -due to volume overload and abd wall edema  -improved, tolerating diet  - Abd [place] without acsites   6. Breast CA  --status post Adriamycin/Cytoxan/Taxol/lumpectomy/radiation, 2009   7. Hypokalemia -replete   Code Status: full Family Communication: patient Disposition Plan: SDU- plan per HF team   Consultants:  cards  Procedures:    Antibiotics:    HPI/Subjective: Doing well  Objective: Filed Vitals:   11/03/12 0800  BP: 108/59  Pulse: 95  Temp: 98.8 F (37.1 C)  Resp: 17    Intake/Output Summary (Last 24 hours) at 11/03/12 0850 Last data filed at 11/03/12 0800  Gross per 24 hour  Intake   2680 ml  Output  81191 ml  Net  -7420 ml   Filed Weights   11/02/12 0500 11/02/12 2130 11/03/12 0603  Weight: 76.6 kg (168 lb 14 oz) 70 kg (154 lb 5.2 oz) 69.4 kg (153 lb)    Exam:  General: NAD. No resp difficulty  Neck: supple. JVD to ear.  Heart: Regular rate & rhythm. 2/6 TR 2/6 MR +s3  Lungs: clear  Abdomen: soft, nontender, nondistended. No hepatosplenomegaly. No bruits or masses. Good bowel sounds.  Neuro: alert & orientedx3, cranial nerves grossly intact. moves all 4 extremities w/o difficulty.   Data Reviewed: Basic  Metabolic Panel:  Recent Labs Lab 10/31/12 0839 11/01/12 0312 11/01/12 1230 11/01/12 2000 11/02/12 0403 11/03/12 0449  NA 130* 132* 135  --  134* 134*  K 2.7* 3.0* 2.9* 3.9 3.4* 3.0*  CL 88* 89* 90*  --  91* 88*  CO2 33* 33* 35*  --  35* 36*  GLUCOSE 114* 231* 228*  --  170* 156*  BUN 20 20 18   --  19 16  CREATININE 0.78 0.77 0.71  --  0.82 0.66  CALCIUM 8.2* 8.4 8.5  --  9.0 9.8  MG  --  1.4*  --   --   --   --    Liver Function Tests:  Recent Labs Lab 10/30/12 0405  AST 26  ALT 13  ALKPHOS 35*  BILITOT 0.9  PROT 5.9*  ALBUMIN 3.0*   No results found for this basename: LIPASE, AMYLASE,  in the last 168 hours No results found for this basename: AMMONIA,  in the last 168 hours CBC:  Recent Labs Lab 10/30/12 0405 11/01/12 0312  WBC 12.2* 8.4  HGB 10.8* 10.6*  HCT 31.6* 32.0*  MCV 77.8* 79.4  PLT 165 136*   Cardiac Enzymes: No results found for this basename: CKTOTAL, CKMB, CKMBINDEX, TROPONINI,  in the last 168 hours BNP (last 3 results)  Recent Labs  10/26/12 1700  PROBNP 3089.0*   CBG:  Recent Labs Lab 11/01/12 2110 11/02/12 0743 11/02/12 1143  11/02/12 1654 11/02/12 2155  GLUCAP 216* 120* 246* 143* 277*    Recent Results (from the past 240 hour(s))  MRSA PCR SCREENING     Status: None   Collection Time    10/29/12 12:40 PM      Result Value Range Status   MRSA by PCR NEGATIVE  NEGATIVE Final   Comment:            The GeneXpert MRSA Assay (FDA     approved for NASAL specimens     only), is one component of a     comprehensive MRSA colonization     surveillance program. It is not     intended to diagnose MRSA     infection nor to guide or     monitor treatment for     MRSA infections.     Studies: No results found.  Scheduled Meds: . ipratropium  0.5 mg Nebulization BID   And  . albuterol  2.5 mg Nebulization BID  . aspirin EC  81 mg Oral Daily  . budesonide (PULMICORT) nebulizer solution  0.25 mg Nebulization BID  .  calcium-vitamin D  1 tablet Oral BID  . digoxin  0.125 mg Oral Daily  . enalapril  15 mg Oral BID  . enoxaparin (LOVENOX) injection  40 mg Subcutaneous QHS  . ferrous sulfate  325 mg Oral QHS  . furosemide  40 mg Oral Daily  . gabapentin  800 mg Oral TID  . insulin aspart  0-15 Units Subcutaneous TID WC  . insulin aspart  0-5 Units Subcutaneous QHS  . insulin aspart  5 Units Subcutaneous TID WC  . insulin glargine  12 Units Subcutaneous QHS  . nicotine  21 mg Transdermal Daily  . pantoprazole  40 mg Oral Daily  . potassium chloride  40 mEq Oral BID  . potassium chloride  40 mEq Oral Once  . sertraline  100 mg Oral QHS  . spironolactone  25 mg Oral Daily   Continuous Infusions: . sodium chloride 14 mL/hr at 10/31/12 2000  . sodium chloride 10 mL/hr at 10/31/12 2000  . milrinone      Principal Problem:   Acute exacerbation of CHF (congestive heart failure) Active Problems:   ADENOCARCINOMA, BREAST   DM   DM type 2 (diabetes mellitus, type 2)   Dyspnea   GERD (gastroesophageal reflux disease)   Cigarette nicotine dependence, uncomplicated   Arthritis   Iron deficiency anemia   Dyslipidemia   Insomnia   Abdominal pain   Acute systolic CHF (congestive heart failure)   Cardiogenic shock    Time spent: 35    [facility], Emily [name]  [facility] Pager [phone]. If 7PM-7AM, please contact night-coverage at [email or web address], password [facility] 11/03/2012, 8:50 AM  LOS: 8 days

## 2012-11-03 NOTE — Progress Notes (Signed)
Reviewed Dr. Alford Highland note. Improved.  Wedge  CO ox 81.   Excellent.  Nothing more to add.

## 2012-11-03 NOTE — Progress Notes (Signed)
Patient ID: Emily [name], female   DOB: Oct 17, 1960, 52 y.o.   MRN: [id number] Advanced Heart Failure Team Rounding Note  Referring Physician: [facility] Primary Physician: [name], MD Primary Cardiologist:  [facility] Katrinka Blazing, III, MD  Reason for Consultation: Low output HF  Subjective   Ms. Hefner is a 52 y/o woman with COPD (ongoing tobacco use), DM2, HTN, breast CA status post Adriamycin/Cytoxan/Taxol/lumpectomy/radiation (2009). She was admitted with acute HF. Echo with EF 20-25% and grade 2 DD.   Cath yesterday with nonobstructive CAD. LVEDP ~30. IV lasix restarted.  Feels good. Ambulating room with [name]. Remains on milrinone. Very vigorous diuresis yesterday.  Creatinine stable.  No on po Lasix.   Co-ox 47% -> 72% -> 64% >66% > 81%  Swan numbers this am (done personally) CVP 3-4 PA 30/14 PCWP 5 Fick CI 2.8  Objective:    Vital Signs:   Temp:  [98.1 F (36.7 C)-99.3 F (37.4 C)] 98.6 F (37 C) (09/07 0700) Pulse Rate:  [76-108] 93 (09/07 0700) Resp:  [17-26] 25 (09/07 0700) BP: (93-136)/(52-88) 118/65 mmHg (09/07 0700) SpO2:  [87 %-99 %] 93 % (09/07 0751) Weight:  [69.4 kg (153 lb)-70 kg (154 lb 5.2 oz)] 69.4 kg (153 lb) (09/07 0603) Last BM Date: 11/01/12  Weight change: Filed Weights   11/02/12 0500 11/02/12 2130 11/03/12 0603  Weight: 76.6 kg (168 lb 14 oz) 70 kg (154 lb 5.2 oz) 69.4 kg (153 lb)    Intake/Output:   Intake/Output Summary (Last 24 hours) at 11/03/12 0800 Last data filed at 11/03/12 0700  Gross per 24 hour  Intake   2420 ml  Output  40981 ml  Net  -7680 ml     Physical Exam: General: Sitting on side of bed No resp difficulty HEENT: normal Neck: supple. Swan RIJ Carotids 2+ bilat; no bruits. No lymphadenopathy or thryomegaly appreciated. Cor: PMI laterally displaced. Regular rate & rhythm. 2/6 TR 2/6 MR, no S3 Lungs: clear Abdomen: soft, nontender, nondistended. No hepatosplenomegaly. No bruits or masses. Good bowel sounds. Extremities:  Warm. No edema. No rash.  Neuro: alert & orientedx3, cranial nerves grossly intact. moves all 4 extremities w/o difficulty. Affect pleasant  Telemetry: SR 90s  Labs: Basic Metabolic Panel:  Recent Labs Lab 10/31/12 0839 11/01/12 0312 11/01/12 1230 11/01/12 2000 11/02/12 0403 11/03/12 0449  NA 130* 132* 135  --  134* 134*  K 2.7* 3.0* 2.9* 3.9 3.4* 3.0*  CL 88* 89* 90*  --  91* 88*  CO2 33* 33* 35*  --  35* 36*  GLUCOSE 114* 231* 228*  --  170* 156*  BUN 20 20 18   --  19 16  CREATININE 0.78 0.77 0.71  --  0.82 0.66  CALCIUM 8.2* 8.4 8.5  --  9.0 9.8  MG  --  1.4*  --   --   --   --     Liver Function Tests:  Recent Labs Lab 10/30/12 0405  AST 26  ALT 13  ALKPHOS 35*  BILITOT 0.9  PROT 5.9*  ALBUMIN 3.0*   No results found for this basename: LIPASE, AMYLASE,  in the last 168 hours No results found for this basename: AMMONIA,  in the last 168 hours  CBC:  Recent Labs Lab 10/30/12 0405 11/01/12 0312  WBC 12.2* 8.4  HGB 10.8* 10.6*  HCT 31.6* 32.0*  MCV 77.8* 79.4  PLT 165 136*    Cardiac Enzymes: No results found for this basename: CKTOTAL, CKMB, CKMBINDEX, TROPONINI,  in  the last 168 hours   BNP: BNP (last 3 results)  Recent Labs  10/26/12 1700  PROBNP 3089.0*    CBG:  Recent Labs Lab 11/01/12 2110 11/02/12 0743 11/02/12 1143 11/02/12 1654 11/02/12 2155  GLUCAP 216* 120* 246* 143* 277*    Coagulation Studies:  Recent Labs  10/31/12 1044  LABPROT 17.1*  INR 1.43    Other results:    Imaging: No results found.   Medications:     Current Medications: . ipratropium  0.5 mg Nebulization BID   And  . albuterol  2.5 mg Nebulization BID  . aspirin EC  81 mg Oral Daily  . budesonide (PULMICORT) nebulizer solution  0.25 mg Nebulization BID  . calcium-vitamin D  1 tablet Oral BID  . digoxin  0.125 mg Oral Daily  . enalapril  15 mg Oral BID  . enoxaparin (LOVENOX) injection  40 mg Subcutaneous QHS  . ferrous sulfate  325 mg  Oral QHS  . furosemide  40 mg Oral BID  . gabapentin  800 mg Oral TID  . insulin aspart  0-15 Units Subcutaneous TID WC  . insulin aspart  0-5 Units Subcutaneous QHS  . insulin aspart  5 Units Subcutaneous TID WC  . insulin glargine  12 Units Subcutaneous QHS  . nicotine  21 mg Transdermal Daily  . pantoprazole  40 mg Oral Daily  . potassium chloride  40 mEq Oral BID  . potassium chloride  40 mEq Oral Once  . sertraline  100 mg Oral QHS  . spironolactone  25 mg Oral Daily    Infusions: . sodium chloride 14 mL/hr at 10/31/12 2000  . sodium chloride 10 mL/hr at 10/31/12 2000  . milrinone       Assessment:   1. Acute systolic HF - suspect due to adriamycin toxicity     --EF 20-25% 2. Cardiogenic shock 3. Breast CA     --status post Adriamycin/Cytoxan/Taxol/lumpectomy/radiation, 2009 4. DM2 5. COPD 6. H/o HTN 7. Hyponatremia/Hypokalemia     Plan/Discussion:    Much improved.  Normal filling pressures.  Good CI this morning.   - Remove swan, leave Cordis for CVP and co-ox monitoring.  - Decrease milrinone to 0.125 mcg/kg/min - Increase enalapril to 15 mg bid - Decrease lasix to once daily po (was on no Lasix prior to admission).  Will hold am dose and give this evening.  - May go to step-down.   Macaiah Mangal,MD 8:00 AM  Advanced Heart Failure Team Pager [phone] (M-F; 7a - 4p)  Please contact [facility] for night-coverage after hours (4p -7a ) and weekends on [email or web address]

## 2012-11-03 NOTE — Progress Notes (Signed)
Pt appear dry wt down 6 kg from this am-notified Dr Ihor Gully w/c includes swan ganz readings.

## 2012-11-04 LAB — BASIC METABOLIC PANEL
BUN: 16 mg/dL (ref 6–23)
CO2: 35 mEq/L — ABNORMAL HIGH (ref 19–32)
Chloride: 88 mEq/L — ABNORMAL LOW (ref 96–112)
Creatinine, Ser: 0.68 mg/dL (ref 0.50–1.10)
GFR calc Af Amer: >90 mL/min (ref >90)
Glucose, Bld: 190 mg/dL — ABNORMAL HIGH (ref 70–99)

## 2012-11-04 LAB — GLUCOSE, CAPILLARY
Glucose-Capillary: 129 mg/dL — ABNORMAL HIGH (ref 70–99)
Glucose-Capillary: 365 mg/dL — ABNORMAL HIGH (ref 70–99)
Glucose-Capillary: 147 mg/dL — ABNORMAL HIGH (ref 70–99)

## 2012-11-04 LAB — CARBOXYHEMOGLOBIN
Carboxyhemoglobin: 2.2 % — ABNORMAL HIGH (ref 0.5–1.5)
Methemoglobin: 0.6 % (ref 0.0–1.5)

## 2012-11-04 MED ORDER — SODIUM CHLORIDE 0.9 % IV BOLUS (SEPSIS)
500.0000 mL | Freq: Once | INTRAVENOUS | Status: AC
  Administered 2012-11-05: 500 mL via INTRAVENOUS

## 2012-11-04 MED ORDER — FUROSEMIDE 40 MG PO TABS
40.0000 mg | ORAL_TABLET | Freq: Two times a day (BID) | ORAL | Status: DC
  Administered 2012-11-04 (×2): 40 mg via ORAL

## 2012-11-04 MED ORDER — POTASSIUM CHLORIDE CRYS ER 20 MEQ PO TBCR
EXTENDED_RELEASE_TABLET | ORAL | Status: AC
  Filled 2012-11-04: qty 2

## 2012-11-04 MED ORDER — POTASSIUM CHLORIDE CRYS ER 20 MEQ PO TBCR
40.0000 meq | EXTENDED_RELEASE_TABLET | Freq: Once | ORAL | Status: AC
  Administered 2012-11-04: 40 meq via ORAL

## 2012-11-04 MED ORDER — CARVEDILOL 3.125 MG PO TABS
3.1250 mg | ORAL_TABLET | Freq: Two times a day (BID) | ORAL | Status: DC
  Administered 2012-11-04: 3.125 mg via ORAL
  Filled 2012-11-04 (×5): qty 1

## 2012-11-04 MED ORDER — POTASSIUM CHLORIDE CRYS ER 20 MEQ PO TBCR
40.0000 meq | EXTENDED_RELEASE_TABLET | Freq: Two times a day (BID) | ORAL | Status: DC
  Administered 2012-11-04 – 2012-11-05 (×2): 40 meq via ORAL
  Filled 2012-11-04 (×3): qty 2

## 2012-11-04 NOTE — Discharge Summary (Signed)
Advanced Heart Failure Team  Discharge Summary   Patient ID: Emily [name] MRN: [id number], DOB/AGE: 1960/04/11 52 y.o. Admit date: 10/26/2012 D/C date:     11/05/2012   Primary Discharge Diagnoses:  1. Acute systolic HF - suspect due to adriamycin toxicity --EF 20-25%  2. Cardiogenic shock  3. Breast CA  --status post Adriamycin/Cytoxan/Taxol/lumpectomy/radiation, 2009  4. DM2  5. COPD  6. H/o HTN  7. Hyponatremia/Hypokalemia    Hospital Course:  Emily [name] is a 52 y/o woman with COPD (ongoing tobacco use), DM2, HTN, breast CA status post Adriamycin/Cytoxan/Taxol/lumpectomy/radiation (2009).   She was admitted with acute HF and volume overload. New onset CM thought to be from breast cancer treatment because she received adriamycin. ECHO revealed EF 20-25% with grade 2 DD. Initally diuresed with IV lasix but she developed hypotension, cool extremities, and poor urine out. At that time she was transferred to ICU for swan placement. Hemodynamics confirmed cardiogenic shock with low out. She was placed on Milrinone and lasix drip. Hemodynamically she improved and  Milrinone and lasix drip weaned off. As noted below LHC was performed as her volume status improved. Post cath she was placed on intermittent IV lasix due to elevated LVEDP (~30). Eventually transitioned to 40 mg po lasix daily. Overall she diuresed 23 pounds.  CO-OX remained stable off Milrinone (64%). She was placed on carvedilol 3.125 mg twice a day, enalapril 10 mg twice a day, digoxin 0.125 mg daily. Placed lasix 40 mg daily and spironolactone 25 mg daily with a low threshold to increase lasix  in the outpatient setting if her weight trends up. Discharge weight 156 pounds.   10/31/12. LHC completed with 50-70% obtuse marginal #1 and otherwise, widely patent coronary arteries. LVEDP ~30.   While hospitalized she received extensive education on daily weights, low slat food choices, limiting fluid intake to < 2 liters per day and the  purpose of her medications. [facility] will follow with telemonitoring for ongoing HF management.    She will continue to be followed closely in the HF clinic with follow up appointment scheduled 11/11/12 at 2:30 with BMET at that appointment.        Discharge Weight Range:  Discharge weight: 156 pounds Discharge Vitals: Blood pressure 85/46, pulse 86, temperature 98.3 F (36.8 C), temperature source Oral, resp. rate 18, height 5\' 5"  (1.651 m), weight 156 lb 8 oz (70.988 kg), SpO2 97.00%.  Labs: Lab Results  Component Value Date   WBC 8.4 11/01/2012   HGB 10.6* 11/01/2012   HCT 32.0* 11/01/2012   MCV 79.4 11/01/2012   PLT 136* 11/01/2012    Recent Labs Lab 10/30/12 0405  11/05/12 0500  NA 131*  < > 131*  K 3.5  < > 3.6  CL 88*  < > 91*  CO2 30  < > 31  BUN 29*  < > 21  CREATININE 1.29*  < > 0.62  CALCIUM 8.0*  < > 8.5  PROT 5.9*  --   --   BILITOT 0.9  --   --   ALKPHOS 35*  --   --   ALT 13  --   --   AST 26  --   --   GLUCOSE 287*  < > 175*  < > = values in this interval not displayed. Lab Results  Component Value Date   CHOL 123 01/01/2012   HDL 27* 01/01/2012   LDLCALC 71 01/01/2012   TRIG 125 01/01/2012   BNP (last 3 results)  Recent Labs  10/26/12 1700  PROBNP 3089.0*    Diagnostic Studies/Procedures   No results found.  Discharge Medications     Medication List    STOP taking these medications       gemfibrozil 600 MG tablet  Commonly known as:  LOPID     potassium chloride 20 MEQ/15ML (10%) solution      TAKE these medications       albuterol 108 (90 BASE) MCG/ACT inhaler  Commonly known as:  PROVENTIL HFA;VENTOLIN HFA  Inhale 2 puffs into the lungs every 6 (six) hours as needed for wheezing.     aspirin 81 MG EC tablet  Take 1 tablet (81 mg total) by mouth daily.     calcium-vitamin D 500-200 MG-UNIT per tablet  Commonly known as:  OSCAL WITH D  Take 1 tablet by mouth 2 (two) times daily.     carvedilol 3.125 MG tablet  Commonly known as:  COREG   Take 1 tablet (3.125 mg total) by mouth 2 (two) times daily with a meal.     digoxin 0.125 MG tablet  Commonly known as:  LANOXIN  Take 1 tablet (0.125 mg total) by mouth daily.     enalapril 10 MG tablet  Commonly known as:  VASOTEC  Take 1 tablet (10 mg total) by mouth 2 (two) times daily.     ferrous sulfate 325 (65 FE) MG tablet  Take 325 mg by mouth at bedtime.     furosemide 40 MG tablet  Commonly known as:  LASIX  Take 1 tablet (40 mg total) by mouth daily.     gabapentin 400 MG tablet  Commonly known as:  NEURONTIN  Take 800 mg by mouth 3 (three) times daily.     glipiZIDE 10 MG tablet  Commonly known as:  GLUCOTROL  Take 10-20 mg by mouth 2 (two) times daily. Take 2 tablets by mouth in the morning and then 1 tablet in the evening     hydrOXYzine 25 MG tablet  Commonly known as:  ATARAX/VISTARIL  Take 25 mg by mouth at bedtime.     letrozole 2.5 MG tablet  Commonly known as:  FEMARA  Take 2.5 mg by mouth every morning.     LORazepam 2 MG tablet  Commonly known as:  ATIVAN  Take 2 mg by mouth at bedtime.     metFORMIN 1000 MG tablet  Commonly known as:  GLUCOPHAGE  Take 1,000 mg by mouth 2 (two) times daily with a meal.     omeprazole 20 MG capsule  Commonly known as:  PRILOSEC  Take 20 mg by mouth every morning.     potassium chloride SA 20 MEQ tablet  Commonly known as:  K-DUR,KLOR-CON  Take 2 tablets (40 mEq total) by mouth daily.     sertraline 100 MG tablet  Commonly known as:  ZOLOFT  Take 100 mg by mouth at bedtime.     simvastatin 20 MG tablet  Commonly known as:  ZOCOR  Take 1 tablet (20 mg total) by mouth every evening.     spironolactone 25 MG tablet  Commonly known as:  ALDACTONE  Take 1 tablet (25 mg total) by mouth daily.     VOLTAREN 1 % Gel  Generic drug:  diclofenac sodium  Apply 1 application topically 4 (four) times daily as needed. Apply to painful areas on hands for arthritis        Disposition   The patient will be  discharged in  stable condition to home. Discharge Orders   Future Appointments Provider Department Dept Phone   11/11/2012 2:30 PM [facility] [phone]   02/17/2013 9:30 AM Newt Lukes, MD [facility] [phone]   03/20/2013 1:00 PM [facility] Covering Provider [facility] [phone]   Future Orders Complete By Expires   Contraindication to ACEI at discharge  As directed    Diet - low sodium heart healthy  As directed    Heart Failure patients record your daily weight using the same scale at the same time of day  As directed    Increase activity slowly  As directed      Follow-up Information   Follow up with [name], MD On 11/14/2012. (at 2:30 Garage Code [id number])    Specialty:  Cardiology   Contact information:   [address] [place] [address] [phone]         Duration of Discharge Encounter: Greater than 35 minutes   Signed, CLEGG,AMY NP-C  11/05/2012, 12:00 PM  Patient seen and examined with Tonye Becket, NP. We discussed all aspects of the encounter. I agree with the assessment and plan as stated above. See my progress note from day of discharge for further details. Ok for d/c. F/u in HF Clinic in 1 week.  [name] 6:22 PM

## 2012-11-04 NOTE — Progress Notes (Signed)
CARDIAC REHAB PHASE I   PRE:  Rate/Rhythm: 87 SR    BP: sitting 77/50, standing 90/54    SaO2: 94 RA  MODE:  Ambulation: 600 ft   POST:  Rate/Rhythm: 97 SR    BP: sitting 92/69     SaO2: 89-93 RA  Tolerated well. BP low initially in recliner but increased with standing. No dizziness. Sts she just feels tired/sleepy all the time. SaO2 low normal walking. Reviewed HF ed. Began discussing smoking cessation. Will need more reiteration. Also needs ex gl. Will f/u. [id number]   Elissa Lovett [place] CES, ACSM 11/04/2012 2:02 PM

## 2012-11-04 NOTE — Progress Notes (Signed)
[facility] PROGRESS NOTE  Emily HEMRICK ZOX:[id number] DOB: 1960/04/28 DOA: 10/26/2012 PCP: [name], MD  Assessment/Plan: 1. Acute decompensated systolic HF, cardiogenic shock  --EF 20-25%; I/O neg 4L; Initial CVP 30; PCWP ~25; PA sat 47%  -off IV lasix drip; + milrinone -- IV lasix -cont dig,  - LHC   -per HF team  2. DM2; A1c 9.6; home metformin+gli[pize  -d/c metformin due to AKI  -increase lantus  QHS; +SSI+ novolog with meals On 9/6 her FBS had improved   3. AKI, hypo Na likely due to cardiogenic shock +/- diuretics  -close monitor renal function on diuresis  -decreasing  4. COPD, smoker  -currently stable, tobacco cessation counseleing  - Duonebs and pulmicort   5. Abdominal pain and distension,  -due to volume overload and abd wall edema  -improved, tolerating diet  - Abd [place] without acsites   6. Breast CA  --status post Adriamycin/Cytoxan/Taxol/lumpectomy/radiation, 2009   7. Hypokalemia -replete   Code Status: full Family Communication: patient Disposition Plan: SDU- plan per HF team; please notify if [facility] can be of any further assitance   Consultants:  cards  Procedures:    Antibiotics:    HPI/Subjective: Doing well Eating breakfast  Objective: Filed Vitals:   11/04/12 0400  BP: 103/57  Pulse: 91  Temp:   Resp:     Intake/Output Summary (Last 24 hours) at 11/04/12 0811 Last data filed at 11/04/12 0600  Gross per 24 hour  Intake 571.93 ml  Output    400 ml  Net 171.93 ml   Filed Weights   11/02/12 2130 11/03/12 0603 11/04/12 0500  Weight: 70 kg (154 lb 5.2 oz) 69.4 kg (153 lb) 68.9 kg (151 lb 14.4 oz)    Exam:  General: NAD. No resp difficulty   Heart: Regular rate & rhythm. 2/6 TR 2/6 MR +s3  Lungs: clear  Abdomen: soft, nontender, nondistended. No hepatosplenomegaly. No bruits or masses. Good bowel sounds.  Neuro: alert & orientedx3, cranial nerves grossly intact. moves all 4 extremities w/o  difficulty.   Data Reviewed: Basic Metabolic Panel:  Recent Labs Lab 11/01/12 0312 11/01/12 1230 11/01/12 2000 11/02/12 0403 11/03/12 0449 11/04/12 0415  NA 132* 135  --  134* 134* 134*  K 3.0* 2.9* 3.9 3.4* 3.0* 3.3*  CL 89* 90*  --  91* 88* 88*  CO2 33* 35*  --  35* 36* 35*  GLUCOSE 231* 228*  --  170* 156* 190*  BUN 20 18  --  19 16 16   CREATININE 0.77 0.71  --  0.82 0.66 0.68  CALCIUM 8.4 8.5  --  9.0 9.8 10.1  MG 1.4*  --   --   --   --   --    Liver Function Tests:  Recent Labs Lab 10/30/12 0405  AST 26  ALT 13  ALKPHOS 35*  BILITOT 0.9  PROT 5.9*  ALBUMIN 3.0*   No results found for this basename: LIPASE, AMYLASE,  in the last 168 hours No results found for this basename: AMMONIA,  in the last 168 hours CBC:  Recent Labs Lab 10/30/12 0405 11/01/12 0312  WBC 12.2* 8.4  HGB 10.8* 10.6*  HCT 31.6* 32.0*  MCV 77.8* 79.4  PLT 165 136*   Cardiac Enzymes: No results found for this basename: CKTOTAL, CKMB, CKMBINDEX, TROPONINI,  in the last 168 hours BNP (last 3 results)  Recent Labs  10/26/12 1700  PROBNP 3089.0*   CBG:  Recent Labs Lab 11/03/12 0820 11/03/12  1228 11/03/12 1601 11/03/12 2150 11/04/12 0806  GLUCAP 103* 196* 188* 209* 129*    Recent Results (from the past 240 hour(s))  MRSA PCR SCREENING     Status: None   Collection Time    10/29/12 12:40 PM      Result Value Range Status   MRSA by PCR NEGATIVE  NEGATIVE Final   Comment:            The GeneXpert MRSA Assay (FDA     approved for NASAL specimens     only), is one component of a     comprehensive MRSA colonization     surveillance program. It is not     intended to diagnose MRSA     infection nor to guide or     monitor treatment for     MRSA infections.     Studies: No results found.  Scheduled Meds: . ipratropium  0.5 mg Nebulization BID   And  . albuterol  2.5 mg Nebulization BID  . aspirin EC  81 mg Oral Daily  . budesonide (PULMICORT) nebulizer solution   0.25 mg Nebulization BID  . calcium-vitamin D  1 tablet Oral BID  . carvedilol  3.125 mg Oral BID WC  . digoxin  0.125 mg Oral Daily  . enalapril  15 mg Oral BID  . enoxaparin (LOVENOX) injection  40 mg Subcutaneous QHS  . ferrous sulfate  325 mg Oral QHS  . furosemide  40 mg Oral BID  . gabapentin  800 mg Oral TID  . insulin aspart  0-15 Units Subcutaneous TID WC  . insulin aspart  0-5 Units Subcutaneous QHS  . insulin aspart  5 Units Subcutaneous TID WC  . insulin glargine  12 Units Subcutaneous QHS  . nicotine  21 mg Transdermal Daily  . pantoprazole  40 mg Oral Daily  . potassium chloride  40 mEq Oral BID  . potassium chloride SA      . sertraline  100 mg Oral QHS  . spironolactone  25 mg Oral Daily   Continuous Infusions: . sodium chloride 17 mL/hr at 11/03/12 2000  . sodium chloride 10 mL/hr at 10/31/12 2000    Principal Problem:   Acute exacerbation of CHF (congestive heart failure) Active Problems:   ADENOCARCINOMA, BREAST   DM   DM type 2 (diabetes mellitus, type 2)   Dyspnea   GERD (gastroesophageal reflux disease)   Cigarette nicotine dependence, uncomplicated   Arthritis   Iron deficiency anemia   Dyslipidemia   Insomnia   Abdominal pain   Acute systolic CHF (congestive heart failure)   Cardiogenic shock    Time spent: 25    [facility], [name]  [facility] Pager [phone]. If 7PM-7AM, please contact night-coverage at [email or web address], password [facility] 11/04/2012, 8:11 AM  LOS: 9 days

## 2012-11-04 NOTE — Progress Notes (Addendum)
Patient ID: Emily [name], female   DOB: Dec 23, 1960, 52 y.o.   MRN: [id number] Advanced Heart Failure Team Rounding Note  Referring Physician: [facility] Primary Physician: [name], MD Primary Cardiologist:  [facility] Katrinka Blazing, III, MD  Reason for Consultation: Low output HF  Subjective   Emily [name] is a 52 y/o woman with COPD (ongoing tobacco use), DM2, HTN, breast CA status post Adriamycin/Cytoxan/Taxol/lumpectomy/radiation (2009). She was admitted with acute HF. Echo with EF 20-25% and grade 2 DD.   Cath yesterday with nonobstructive CAD. LVEDP ~30. IV lasix restarted.  Yesterday Milrinone cut back to 0.125 mcg and enalapril was increased 15 mg bid. Last night O2 sats dropped to 84%. Placed back on 2 liters [place] O2 sats >94% Weight down 2 pounds. Overall weight down 25 pounds.   Denies SOB/PND.Orthopnea.   Co-ox 47% -> 72% -> 64% >66% >81>88%   Objective:    Vital Signs:   Temp:  [97.1 F (36.2 C)-98.8 F (37.1 C)] 98.6 F (37 C) (09/08 0330) Pulse Rate:  [91-95] 91 (09/08 0400) Resp:  [16-18] 18 (09/08 0330) BP: (95-117)/(51-66) 103/57 mmHg (09/08 0400) SpO2:  [88 %-96 %] 96 % (09/08 0400) Weight:  [151 lb 14.4 oz (68.9 kg)] 151 lb 14.4 oz (68.9 kg) (09/08 0500) Last BM Date: 11/02/12  Weight change: Filed Weights   11/02/12 2130 11/03/12 0603 11/04/12 0500  Weight: 154 lb 5.2 oz (70 kg) 153 lb (69.4 kg) 151 lb 14.4 oz (68.9 kg)    Intake/Output:   Intake/Output Summary (Last 24 hours) at 11/04/12 0701 Last data filed at 11/04/12 0600  Gross per 24 hour  Intake 831.93 ml  Output    400 ml  Net 431.93 ml     Physical Exam: CVP ~8-9 General: Sitting on side of bed No resp difficulty HEENT: normal Neck: supple.  RIJ sheath Carotids 2+ bilat; no bruits. No lymphadenopathy or thryomegaly appreciated. JVP 9-10 Cor: PMI laterally displaced. Regular rate & rhythm. 2/6 TR 2/6 MR, no S3 Lungs: clear Abdomen: soft, nontender, nondistended. No hepatosplenomegaly. No bruits or  masses. Good bowel sounds. Extremities: Warm. No edema. No rash. Bilateral ted hose  Neuro: alert & orientedx3, cranial nerves grossly intact. moves all 4 extremities w/o difficulty. Affect pleasant  Telemetry: SR 90s  Labs: Basic Metabolic Panel:  Recent Labs Lab 11/01/12 0312 11/01/12 1230 11/01/12 2000 11/02/12 0403 11/03/12 0449 11/04/12 0415  NA 132* 135  --  134* 134* 134*  K 3.0* 2.9* 3.9 3.4* 3.0* 3.3*  CL 89* 90*  --  91* 88* 88*  CO2 33* 35*  --  35* 36* 35*  GLUCOSE 231* 228*  --  170* 156* 190*  BUN 20 18  --  19 16 16   CREATININE 0.77 0.71  --  0.82 0.66 0.68  CALCIUM 8.4 8.5  --  9.0 9.8 10.1  MG 1.4*  --   --   --   --   --     Liver Function Tests:  Recent Labs Lab 10/30/12 0405  AST 26  ALT 13  ALKPHOS 35*  BILITOT 0.9  PROT 5.9*  ALBUMIN 3.0*   No results found for this basename: LIPASE, AMYLASE,  in the last 168 hours No results found for this basename: AMMONIA,  in the last 168 hours  CBC:  Recent Labs Lab 10/30/12 0405 11/01/12 0312  WBC 12.2* 8.4  HGB 10.8* 10.6*  HCT 31.6* 32.0*  MCV 77.8* 79.4  PLT 165 136*    Cardiac Enzymes:  No results found for this basename: CKTOTAL, CKMB, CKMBINDEX, TROPONINI,  in the last 168 hours   BNP: BNP (last 3 results)  Recent Labs  10/26/12 1700  PROBNP 3089.0*    CBG:  Recent Labs Lab 11/02/12 2155 11/03/12 0820 11/03/12 1228 11/03/12 1601 11/03/12 2150  GLUCAP 277* 103* 196* 188* 209*    Coagulation Studies: No results found for this basename: LABPROT, INR,  in the last 72 hours  Other results:    Imaging: No results found.   Medications:     Current Medications: . ipratropium  0.5 mg Nebulization BID   And  . albuterol  2.5 mg Nebulization BID  . aspirin EC  81 mg Oral Daily  . budesonide (PULMICORT) nebulizer solution  0.25 mg Nebulization BID  . calcium-vitamin D  1 tablet Oral BID  . digoxin  0.125 mg Oral Daily  . enalapril  15 mg Oral BID  . enoxaparin  (LOVENOX) injection  40 mg Subcutaneous QHS  . ferrous sulfate  325 mg Oral QHS  . furosemide  40 mg Oral Daily  . gabapentin  800 mg Oral TID  . insulin aspart  0-15 Units Subcutaneous TID WC  . insulin aspart  0-5 Units Subcutaneous QHS  . insulin aspart  5 Units Subcutaneous TID WC  . insulin glargine  12 Units Subcutaneous QHS  . nicotine  21 mg Transdermal Daily  . pantoprazole  40 mg Oral Daily  . potassium chloride  40 mEq Oral BID  . sertraline  100 mg Oral QHS  . spironolactone  25 mg Oral Daily    Infusions: . sodium chloride 17 mL/hr at 11/03/12 2000  . sodium chloride 10 mL/hr at 10/31/12 2000  . milrinone 0.124 mcg/kg/min (11/03/12 0900)     Assessment:   1. Acute systolic HF - suspect due to adriamycin toxicity     --EF 20-25% 2. Cardiogenic shock 3. Breast CA     --status post Adriamycin/Cytoxan/Taxol/lumpectomy/radiation, 2009 4. DM2 5. COPD 6. H/o HTN 7. Hyponatremia/Hypokalemia     Plan/Discussion:    Volume status mildly elevated but will receive lasix 40 mg twice a day. Continue spironolactone 25 mg daily. Overall weight down 25 pounds.  - CO-OX 88% . Renal function stabile. Stop milrinone to 0.125 mcg/kg/min - Add carvedilol 3.125 mg twice a day. Continue digoxin 0.125 mg daily -Continue enalapril to 15 mg bid -supplement potassium.  -Consult cardiac rehab. Will need to ambulate on room air and check O2 sats. May need home oxygen.  -Consult dietitian for HF education. Changed to low sodium diet with fluid restrictions.  -Home in the day or two.  Will need HH   CLEGG,AMY,NP-C 7:01 AM  Advanced Heart Failure Team Pager [phone] (M-F; 7a - 4p)  Please contact [facility] for night-coverage after hours (4p -7a ) and weekends on [email or web address]  Patient seen and examined with Tonye Becket, NP. We discussed all aspects of the encounter. I agree with the assessment and plan as stated above.    She continues to improve. Agree with stopping milrinone  and adding low-dose carvedilol. BP too soft to push enalapril. Supp K+. Check ambulatory O2 sats. Possible d/c in am.  Emily [name] 8:18 AM

## 2012-11-04 NOTE — Plan of Care (Signed)
Problem: Food- and Nutrition-Related Knowledge Deficit (NB-1.1) Goal: Nutrition education Formal process to instruct or train a patient/client in a skill or to impart knowledge to help patients/clients voluntarily manage or modify food choices and eating behavior to maintain or improve health. Outcome: Progressing Nutrition Education Note  RD consulted for nutrition education regarding new onset CHF.  RD provided "Low Sodium Nutrition Therapy" handout from the Academy of [employer]. Reviewed patient's dietary recall. Provided examples on ways to decrease sodium intake in diet. Discouraged intake of processed foods and use of salt shaker. Encouraged fresh fruits and vegetables as well as whole grain sources of carbohydrates to maximize fiber intake.   RD discussed why it is important for patient to adhere to diet recommendations, and emphasized the role of fluids, foods to avoid, and importance of weighing self daily. Pt states she has watched her sodium in the past due to bloating with success, but was not recently following a low sodium diet. Pt asks appropriate questions which are answered. Teach back method used.  Expect good compliance.  Body mass index is 25.28 kg/(m^2). Pt meets criteria for overweight based on current BMI.  Current diet order is Heart Healthy, patient is consuming approximately 100% of meals at this time. Labs and medications reviewed. No further nutrition interventions warranted at this time. RD contact information provided. If additional nutrition issues arise, please re-consult RD.   Loyce Dys, MS RD LDN Clinical Inpatient Dietitian Pager: [phone] Weekend/After hours pager: [phone]

## 2012-11-05 LAB — BASIC METABOLIC PANEL
BUN: 21 mg/dL (ref 6–23)
Calcium: 8.5 mg/dL (ref 8.4–10.5)
Creatinine, Ser: 0.62 mg/dL (ref 0.50–1.10)
GFR calc Af Amer: >90 mL/min (ref >90)
GFR calc non Af Amer: >90 mL/min (ref >90)

## 2012-11-05 LAB — CARBOXYHEMOGLOBIN: Total hemoglobin: 10.2 g/dL — ABNORMAL LOW (ref 12.0–16.0)

## 2012-11-05 LAB — GLUCOSE, CAPILLARY: Glucose-Capillary: 183 mg/dL — ABNORMAL HIGH (ref 70–99)

## 2012-11-05 MED ORDER — DIGOXIN 125 MCG PO TABS: 0.1250 mg | ORAL_TABLET | Freq: Every day | ORAL | Status: DC

## 2012-11-05 MED ORDER — SPIRONOLACTONE 25 MG PO TABS: 25.0000 mg | ORAL_TABLET | Freq: Every day | ORAL | Status: DC

## 2012-11-05 MED ORDER — CARVEDILOL 3.125 MG PO TABS: 3.1250 mg | ORAL_TABLET | Freq: Two times a day (BID) | ORAL | Status: DC

## 2012-11-05 MED ORDER — FUROSEMIDE 40 MG PO TABS: 40.0000 mg | ORAL_TABLET | Freq: Every day | ORAL | Status: DC

## 2012-11-05 MED ORDER — ENALAPRIL MALEATE 10 MG PO TABS: 10.0000 mg | ORAL_TABLET | Freq: Two times a day (BID) | ORAL | Status: DC

## 2012-11-05 MED ORDER — ASPIRIN 81 MG PO TBEC: 81.0000 mg | DELAYED_RELEASE_TABLET | Freq: Every day | ORAL | Status: DC

## 2012-11-05 MED ORDER — SIMVASTATIN 20 MG PO TABS: 20.0000 mg | ORAL_TABLET | Freq: Every evening | ORAL | Status: DC

## 2012-11-05 MED ORDER — POTASSIUM CHLORIDE CRYS ER 20 MEQ PO TBCR: 40.0000 meq | EXTENDED_RELEASE_TABLET | Freq: Every day | ORAL | Status: DC

## 2012-11-05 MED ORDER — ENALAPRIL MALEATE 10 MG PO TABS
10.0000 mg | ORAL_TABLET | Freq: Two times a day (BID) | ORAL | Status: DC
  Filled 2012-11-05 (×2): qty 1

## 2012-11-05 NOTE — Progress Notes (Signed)
11/05/2012   11:07 AM   Discharged home.  Lorah Kalina, Linnell Fulling

## 2012-11-05 NOTE — Progress Notes (Signed)
Patient ID: Emily [name], female   DOB: 21-Mar-1960, 52 y.o.   MRN: [id number] Advanced Heart Failure Team Rounding Note  Referring Physician: [facility] Primary Physician: [name], MD Primary Cardiologist:  [facility] Katrinka Blazing, III, MD  Reason for Consultation: Low output HF  Subjective   Ms. Buccieri is a 52 y/o woman with COPD (ongoing tobacco use), DM2, HTN, breast CA status post Adriamycin/Cytoxan/Taxol/lumpectomy/radiation (2009). She was admitted with acute HF. Echo with EF 20-25% and grade 2 DD.   Cath with nonobstructive CAD. LVEDP ~30. I  Yesterday Milrinone stopped. Ambulating with cardiac rehab with sats > 90% on room air. Overall weight down 23 pounds.   Last night SBP dropped into the 70s and she received 500 cc NS bolus. BP improved SBP 90s.   Denies SOB/PND.Orthopnea.   Co-ox 47% -> 72% -> 64% >66% >81>88>64%   Objective:    Vital Signs:   Temp:  [98 F (36.7 C)-98.6 F (37 C)] 98.4 F (36.9 C) (09/09 0400) Pulse Rate:  [92-96] 94 (09/08 1900) Resp:  [18] 18 (09/08 1620) BP: (70-106)/(41-82) 98/82 mmHg (09/09 0400) SpO2:  [89 %-97 %] 96 % (09/08 1936) Weight:  [156 lb 8 oz (70.988 kg)] 156 lb 8 oz (70.988 kg) (09/09 0500) Last BM Date: 11/02/12  Weight change: Filed Weights   11/03/12 0603 11/04/12 0500 11/05/12 0500  Weight: 153 lb (69.4 kg) 151 lb 14.4 oz (68.9 kg) 156 lb 8 oz (70.988 kg)    Intake/Output:   Intake/Output Summary (Last 24 hours) at 11/05/12 0708 Last data filed at 11/05/12 0600  Gross per 24 hour  Intake   2333 ml  Output   2150 ml  Net    183 ml     Physical Exam: General: Sitting on side of bed No resp difficulty HEENT: normal Neck: supple.  RIJ sheath Carotids 2+ bilat; no bruits. No lymphadenopathy or thryomegaly appreciated. JVP  Cor: PMI laterally displaced. Regular rate & rhythm. 2/6 TR 2/6 MR, no S3 Lungs: clear Abdomen: soft, nontender, nondistended. No hepatosplenomegaly. No bruits or masses. Good bowel sounds. Extremities:  Warm. No edema. No rash. Bilateral ted hose  Neuro: alert & orientedx3, cranial nerves grossly intact. moves all 4 extremities w/o difficulty. Affect pleasant  Telemetry: SR 90s  Labs: Basic Metabolic Panel:  Recent Labs Lab 11/01/12 0312 11/01/12 1230 11/01/12 2000 11/02/12 0403 11/03/12 0449 11/04/12 0415 11/05/12 0500  NA 132* 135  --  134* 134* 134* 131*  K 3.0* 2.9* 3.9 3.4* 3.0* 3.3* 3.6  CL 89* 90*  --  91* 88* 88* 91*  CO2 33* 35*  --  35* 36* 35* 31  GLUCOSE 231* 228*  --  170* 156* 190* 175*  BUN 20 18  --  19 16 16 21   CREATININE 0.77 0.71  --  0.82 0.66 0.68 0.62  CALCIUM 8.4 8.5  --  9.0 9.8 10.1 8.5  MG 1.4*  --   --   --   --   --   --     Liver Function Tests:  Recent Labs Lab 10/30/12 0405  AST 26  ALT 13  ALKPHOS 35*  BILITOT 0.9  PROT 5.9*  ALBUMIN 3.0*   No results found for this basename: LIPASE, AMYLASE,  in the last 168 hours No results found for this basename: AMMONIA,  in the last 168 hours  CBC:  Recent Labs Lab 10/30/12 0405 11/01/12 0312  WBC 12.2* 8.4  HGB 10.8* 10.6*  HCT 31.6* 32.0*  MCV 77.8* 79.4  PLT 165 136*    Cardiac Enzymes: No results found for this basename: CKTOTAL, CKMB, CKMBINDEX, TROPONINI,  in the last 168 hours   BNP: BNP (last 3 results)  Recent Labs  10/26/12 1700  PROBNP 3089.0*    CBG:  Recent Labs Lab 11/03/12 2150 11/04/12 0806 11/04/12 1322 11/04/12 1616 11/04/12 2132  GLUCAP 209* 129* 365* 147* 116*    Coagulation Studies: No results found for this basename: LABPROT, INR,  in the last 72 hours  Other results:    Imaging: No results found.   Medications:     Current Medications: . ipratropium  0.5 mg Nebulization BID   And  . albuterol  2.5 mg Nebulization BID  . aspirin EC  81 mg Oral Daily  . budesonide (PULMICORT) nebulizer solution  0.25 mg Nebulization BID  . calcium-vitamin D  1 tablet Oral BID  . carvedilol  3.125 mg Oral BID WC  . digoxin  0.125 mg Oral  Daily  . enalapril  15 mg Oral BID  . enoxaparin (LOVENOX) injection  40 mg Subcutaneous QHS  . ferrous sulfate  325 mg Oral QHS  . gabapentin  800 mg Oral TID  . insulin aspart  0-15 Units Subcutaneous TID WC  . insulin aspart  0-5 Units Subcutaneous QHS  . insulin aspart  5 Units Subcutaneous TID WC  . insulin glargine  12 Units Subcutaneous QHS  . nicotine  21 mg Transdermal Daily  . pantoprazole  40 mg Oral Daily  . potassium chloride  40 mEq Oral BID  . sertraline  100 mg Oral QHS  . spironolactone  25 mg Oral Daily    Infusions: . sodium chloride 20 mL/hr at 11/04/12 0820  . sodium chloride 10 mL/hr at 10/31/12 2000     Assessment:   1. Acute systolic HF - suspect due to adriamycin toxicity     --EF 20-25% 2. Cardiogenic shock 3. Breast CA     --status post Adriamycin/Cytoxan/Taxol/lumpectomy/radiation, 2009 4. DM2 5. COPD 6. H/o HTN 7. Hyponatremia/Hypokalemia     Plan/Discussion:    Volume status ok. Hold lasix today and restart 40 mg daily tomorrow.  Continue spironolactone 25 mg daily. Overall weight down 23 pounds.  - CO-OX 64% . Renal function stabile.  - Continue carvedilol 3.125 mg twice a day. Continue digoxin 0.125 mg daily. Dig level pending -Drop enalapril back to 10 mg bid -supplement potassium.  -Consult cardiac rehab.  -Consult dietitian for HF education. Changed to low sodium diet with fluid restrictions.  -Home in the day or two.  Will need HH   CLEGG,AMY,NP-C 7:08 AM  Advanced Heart Failure Team Pager [phone] (M-F; 7a - 4p)  Please contact [facility] for night-coverage after hours (4p -7a ) and weekends on [email or web address]  Patient seen and examined with Tonye Becket, NP. We discussed all aspects of the encounter. I agree with the assessment and plan as stated above.   She is much improved. BP low over night and given 500cc. Better this am. Ok for d/c today on above regimen with instructions to take extra 40 lasix if weight up 3 or more  pounds.  Daniel Bensimhon,MD 7:43 AM

## 2012-11-05 NOTE — Progress Notes (Signed)
11/05/2012  1000  Rt IJ cordis removed and dssg applied.  Cath intact. Discharge instrusctions given and questions answered.Emily [name], Linnell Fulling

## 2012-11-11 ENCOUNTER — Encounter (HOSPITAL_COMMUNITY): Payer: Self-pay

## 2012-11-11 ENCOUNTER — Ambulatory Visit (HOSPITAL_COMMUNITY)
Admit: 2012-11-11 | Discharge: 2012-11-11 | Disposition: A | Discharge status: Home / Self Care | Payer: Medicare Other | Source: Ambulatory Visit | Attending: Cardiology | Admitting: Cardiology

## 2012-11-11 VITALS — BP 96/58 | HR 90 | Ht 65.0 in | Wt 157.0 lb

## 2012-11-11 DIAGNOSIS — I509 Heart failure, unspecified: Secondary | ICD-10-CM

## 2012-11-11 DIAGNOSIS — I5021 Acute systolic (congestive) heart failure: Secondary | ICD-10-CM | POA: Insufficient documentation

## 2012-11-11 LAB — BASIC METABOLIC PANEL
CO2: 28 mEq/L (ref 19–32)
Calcium: 9.5 mg/dL (ref 8.4–10.5)
Sodium: 134 mEq/L — ABNORMAL LOW (ref 135–145)

## 2012-11-11 MED ORDER — CARVEDILOL 6.25 MG PO TABS: 6.2500 mg | ORAL_TABLET | Freq: Two times a day (BID) | ORAL | Status: DC

## 2012-11-11 MED ORDER — FUROSEMIDE 40 MG PO TABS: ORAL_TABLET | ORAL | Status: DC

## 2012-11-11 NOTE — Patient Instructions (Addendum)
Increase Carvedilol to 6.25 mg (2 tabs) Twice daily when you run out get new prescription of 6.25 mg tabs and take 1 tab Twice daily   Increase Furosemide to 40 mg in AM and 20 mg (1/2 tab) in PM  [facility] will call you to schedule the cardiac MRI once we have approval from your insurance company  Your physician recommends that you schedule a follow-up appointment in: 3 weeks with fasting labs  You have been referred to Libby Maw, NP at [place] at [facility], Caleen Essex 9/19 at 10 am [phone])

## 2012-11-12 DIAGNOSIS — I5022 Chronic systolic (congestive) heart failure: Secondary | ICD-10-CM | POA: Insufficient documentation

## 2012-11-12 DIAGNOSIS — I2510 Atherosclerotic heart disease of native coronary artery without angina pectoris: Secondary | ICD-10-CM | POA: Insufficient documentation

## 2012-11-12 DIAGNOSIS — I5042 Chronic combined systolic (congestive) and diastolic (congestive) heart failure: Secondary | ICD-10-CM | POA: Insufficient documentation

## 2012-11-12 LAB — TSH: TSH: 2.992 u[IU]/mL (ref 0.350–4.500)

## 2012-11-12 NOTE — Progress Notes (Signed)
Patient ID: Emily [name], female   DOB: 11/10/1960, 52 y.o.   MRN: [id number]  52 yo with history of breast cancer and COPD presents for outpatient followup after recent admission with acute systolic CHF and the new finding of nonischemic cardiomyopathy.  Patient was admitted in 8/14 with acute pulmonary edema and volume overload.  LHC showed moderate nonobstructive CAD.  She was started on milrinone and diuresed.  She diuresed vigorously and was titrated off milrinone.  Cause of cardiomyopathy suspected to be Adriamycin toxicity.   Since discharge, she has done well.  She has been fatigued but denies exertional dyspnea or chest pain.  No orthopnea or PND.  BP is soft today but SBP has been in the 100s-110s when she checks at home.  No lightheadedness. No syncope.   ECG: NSR, normal QRS interval, nonspecific T wave flattening.   Labs (9/14): digoxin 0.4, K 3.6, creatinine 0.62  PMH: 1. COPD: PFTs (1/14) with FEV1 68%.  Quit smoking 9/14.  2. Type II diabetes  3. HTN 4. Breast cancer: 2009, s/p Adriamycin/Cytoxan/Taxol chemo, lumpectomy and radiation.   5. Nonischemic cardiomyopathy: Possibly due to Adriamycin.  Echo (8/14) with EF 20-25%, global hypokinesis, moderate diastolic dysfunction, normal RV size and with mild to moderately decreased systolic function, moderate TR.   6. CAD: LHC (8/14) with nonobstructive CAD; 40-50% mLAD, 50-70% LCx, 50% PDA.  7. Hyperlipidemia  SH: Lives alone in [place].  Mother is next door.  Quit smoking in 9/14.  FH: No premature CAD.  No family history of cardiomyopathy.   ROS: All systems reviewed and negative except as per HPI.   Current Outpatient Prescriptions  Medication Sig Dispense Refill  . aspirin EC 81 MG EC tablet Take 1 tablet (81 mg total) by mouth daily.  30 tablet  3  . calcium-vitamin D (OSCAL WITH D) 500-200 MG-UNIT per tablet Take 1 tablet by mouth 2 (two) times daily.      . carvedilol (COREG) 6.25 MG tablet Take 1 tablet (6.25 mg total)  by mouth 2 (two) times daily with a meal.  60 tablet  3  . digoxin (LANOXIN) 0.125 MG tablet Take 1 tablet (0.125 mg total) by mouth daily.  30 tablet  3  . enalapril (VASOTEC) 10 MG tablet Take 1 tablet (10 mg total) by mouth 2 (two) times daily.  60 tablet  3  . ferrous sulfate 325 (65 FE) MG tablet Take 325 mg by mouth at bedtime.       . furosemide (LASIX) 40 MG tablet Take 40 mg in AM and 20 mg in PM  45 tablet  3  . gabapentin (NEURONTIN) 400 MG tablet Take 800 mg by mouth 3 (three) times daily.        Marland Kitchen glipiZIDE (GLUCOTROL) 10 MG tablet Take 10-20 mg by mouth 2 (two) times daily. Take 2 tablets by mouth in the morning and then 1 tablet in the evening      . hydrOXYzine (ATARAX/VISTARIL) 25 MG tablet Take 25 mg by mouth at bedtime.       Marland Kitchen letrozole (FEMARA) 2.5 MG tablet Take 2.5 mg by mouth every morning.      Marland Kitchen LORazepam (ATIVAN) 2 MG tablet Take 2 mg by mouth at bedtime.       . metFORMIN (GLUCOPHAGE) 1000 MG tablet Take 1,000 mg by mouth 2 (two) times daily with a meal.       . omeprazole (PRILOSEC) 20 MG capsule Take 20 mg by mouth every  morning.       . potassium chloride SA (K-DUR,KLOR-CON) 20 MEQ tablet Take 2 tablets (40 mEq total) by mouth daily.  30 tablet  3  . sertraline (ZOLOFT) 100 MG tablet Take 100 mg by mouth at bedtime.       . simvastatin (ZOCOR) 20 MG tablet Take 1 tablet (20 mg total) by mouth every evening.  30 tablet  3  . spironolactone (ALDACTONE) 25 MG tablet Take 1 tablet (25 mg total) by mouth daily.  30 tablet  3  . albuterol (PROVENTIL HFA;VENTOLIN HFA) 108 (90 BASE) MCG/ACT inhaler Inhale 2 puffs into the lungs every 6 (six) hours as needed for wheezing.  1 Inhaler  2  . diclofenac sodium (VOLTAREN) 1 % GEL Apply 1 application topically 4 (four) times daily as needed. Apply to painful areas on hands for arthritis       No current facility-administered medications for this encounter.    BP 96/58  Pulse 90  Ht 5\' 5"  (1.651 m)  Wt 157 lb (71.215 kg)  BMI  26.13 kg/m2  SpO2 93% General: NAD Neck: JVP 9-10 cm, no thyromegaly or thyroid nodule.  Lungs: Clear to auscultation bilaterally with normal respiratory effort. CV: Nondisplaced PMI.  Heart regular S1/S2, no S3/S4, no murmur.  No peripheral edema.  No carotid bruit.  Normal pedal pulses.  Abdomen: Soft, nontender, no hepatosplenomegaly, no distention.  Skin: Intact without lesions or rashes.  Neurologic: Alert and oriented x 3.  Psych: Normal affect. Extremities: No clubbing or cyanosis.  HEENT: Normal.   Assessment/Plan: 1. Chronic systolic CHF: Nonischemic cardiomyopathy.  Possible Adriamycin toxicity.  She does appear volume overloaded on exam though symptoms are NYHA class II.  - Increase Lasix to 40 mg qam, 20 mg qpm with BMET today and in 3 wks.  - Continue current enalapril, digoxin (good level recently), spironolactone.  - I will increase Coreg to 6.25 mg bid.  - Low salt diet discussed.  - I will arrange for cardiac MRI to assess for infiltrative disease. - Check TSH, SPEP as part of cardiomyopathy workup. - Followup in office in 3 wks.   2. CAD: Moderate nonobstructive disease.  Continue ASA 81 and statin.  Will need fasting lipids with labs in 3 wks.  3. COPD: I congratulated her for having quit smoking.   Marca Ancona 11/12/2012

## 2012-11-13 ENCOUNTER — Other Ambulatory Visit (HOSPITAL_COMMUNITY): Payer: Self-pay | Admitting: Cardiology

## 2012-11-13 DIAGNOSIS — I5022 Chronic systolic (congestive) heart failure: Secondary | ICD-10-CM

## 2012-11-13 LAB — PROTEIN ELECTROPHORESIS, SERUM
Alpha-2-Globulin: 13.6 % — ABNORMAL HIGH (ref 7.1–11.8)
M-Spike, %: NOT DETECTED g/dL
Total Protein ELP: 7.4 g/dL (ref 6.0–8.3)

## 2012-11-13 MED ORDER — POTASSIUM CHLORIDE CRYS ER 20 MEQ PO TBCR: 40.0000 meq | EXTENDED_RELEASE_TABLET | Freq: Every day | ORAL | Status: DC

## 2012-11-15 ENCOUNTER — Ambulatory Visit (INDEPENDENT_AMBULATORY_CARE_PROVIDER_SITE_OTHER): Payer: Medicare Other | Admitting: Nurse Practitioner

## 2012-11-15 ENCOUNTER — Encounter: Payer: Self-pay | Admitting: Nurse Practitioner

## 2012-11-15 ENCOUNTER — Telehealth: Payer: Self-pay | Admitting: Nurse Practitioner

## 2012-11-15 VITALS — BP 84/54 | HR 90 | Temp 97.8°F | Ht 62.5 in | Wt 154.8 lb

## 2012-11-15 DIAGNOSIS — F3289 Other specified depressive episodes: Secondary | ICD-10-CM

## 2012-11-15 DIAGNOSIS — Z23 Encounter for immunization: Secondary | ICD-10-CM

## 2012-11-15 DIAGNOSIS — K760 Fatty (change of) liver, not elsewhere classified: Secondary | ICD-10-CM

## 2012-11-15 DIAGNOSIS — K7689 Other specified diseases of liver: Secondary | ICD-10-CM

## 2012-11-15 DIAGNOSIS — F329 Major depressive disorder, single episode, unspecified: Secondary | ICD-10-CM

## 2012-11-15 DIAGNOSIS — Z8679 Personal history of other diseases of the circulatory system: Secondary | ICD-10-CM

## 2012-11-15 DIAGNOSIS — K219 Gastro-esophageal reflux disease without esophagitis: Secondary | ICD-10-CM

## 2012-11-15 DIAGNOSIS — E119 Type 2 diabetes mellitus without complications: Secondary | ICD-10-CM

## 2012-11-15 DIAGNOSIS — I509 Heart failure, unspecified: Secondary | ICD-10-CM

## 2012-11-15 LAB — MICROALBUMIN / CREATININE URINE RATIO
Creatinine,U: 10.9 mg/dL
Microalb, Ur: 0.6 mg/dL (ref 0.0–1.9)

## 2012-11-15 NOTE — Patient Instructions (Addendum)
Start taking 250 mg magnesium daily. If stools become loose, cut back to 150 mg daily. Decrease enalapril to once daily. You may need 2500-3000 mg sodium daily. I will review records to determine future blood work. I will inquire of Dr Shirlee Latch regarding taking simvastatin. I will call you regarding diabetes medication changes.  You may decrease neurontin as tolerated. You may decrease zoloft to 1/2 tab daily. Do this for at least 2-3 weeks to see if depressive symptoms get worse. If not, continue with 1/2 T until I see you again.   Heart Failure Heart failure is a condition in which the heart has trouble pumping blood. This means your heart does not pump blood efficiently for your body to work well. In some cases of heart failure, fluid may back up into your lungs or you may have swelling (edema) in your lower legs. Heart failure is a long-term (chronic) condition. It is important for you to take good care of yourself and follow your caregiver's treatment plan. CAUSES   Health conditions:  High blood pressure (hypertension) causes the heart muscle to work harder than normal. When pressure in the blood vessels is high, the heart needs to pump (contract) with more force in order to circulate blood throughout the body. High blood pressure eventually causes the heart to become stiff and weak.  Coronary artery disease (CAD) is the buildup of cholesterol and fat (plaque) in the arteries of the heart. The blockage in the arteries deprives the heart muscle of oxygen and blood. This can cause chest pain and may lead to a heart attack. High blood pressure can also contribute to CAD.  Heart attack (myocardial infarction) occurs when 1 or more arteries in the heart become blocked. The loss of oxygen damages the muscle tissue of the heart. When this happens, part of the heart muscle dies. The injured tissue does not contract as well and weakens the heart's ability to pump blood.  Abnormal heart valves can cause  heart failure when the heart valves do not open and close properly. This makes the heart muscle pump harder to keep the blood flowing.  Heart muscle disease (cardiomyopathy or myocarditis) is damage to the heart muscle from a variety of causes. These can include drug or alcohol abuse, infections, or unknown reasons. These can increase the risk of heart failure.  Lung disease makes the heart work harder because the lungs do not work properly. This can cause a strain on the heart, leading it to fail.  Diabetes increases the risk of heart failure. High blood sugar contributes to high fat (lipid) levels in the blood. Diabetes can also cause slow damage to tiny blood vessels that carry important nutrients to the heart muscle. When the heart does not get enough oxygen and food, it can cause the heart to become weak and stiff. This leads to a heart that does not contract efficiently.  Other diseases can contribute to heart failure. These include abnormal heart rhythms, thyroid problems, and low blood counts (anemia).  Unhealthy behaviors:  Being overweight.  Smoking or chewing tobacco.  Eating foods high in fat and cholesterol.  Eating foods or drinking beverages high in salt.  Abusing drugs or alcohol.  Lacking physical activity. SYMPTOMS  Heart failure symptoms may vary and can be hard to detect. Symptoms may include:  Shortness of breath with activity, such as climbing stairs.  Persistent cough.  Swelling of the feet, ankles, legs, or abdomen.  Unexplained, sudden weight gain.  Difficulty breathing when  lying flat (orthopnea).  Waking from sleep because of the need to sit up and get more air.  Rapid heartbeat.  Fatigue and loss of energy.  Feeling lightheaded, dizzy, or close to fainting.  Loss of appetite.  Nausea.  Increased urination during the night (nocturia). DIAGNOSIS  A diagnosis of heart failure is based on your history, symptoms, physical examination, and  diagnostic tests. Diagnostic tests for heart failure may include:  Electrocardiography.  Chest X-ray.  Blood tests.  Exercise stress test.  Echocardiography.  Cardiac angiography.  Radionuclide scans. TREATMENT  Treatment is aimed at managing the symptoms of heart failure. Medicines, behavioral changes, or surgical intervention may be necessary to treat heart failure.  Medicines to help treat heart failure may include:  Angiotensin-converting enzyme (ACE) inhibitors. This type of medicine blocks the effects of a blood protein called angiotensin-converting enzyme. ACE inhibitors relax (dilate) the blood vessels and help lower blood pressure.  Angiotensin receptor blockers. This type of medicine blocks the actions of a blood protein called angiotensin. Angiotensin receptor blockers dilate the blood vessels and help lower blood pressure.  Aldosterone antagonists. This type of medicine helps rid your body of extra fluid. This loss of extra fluid lowers the volume of blood the heart pumps.  Water pills (diuretics). Diuretics cause the kidneys to remove salt and water from the blood. The extra fluid is removed through urination.  Beta blockers. These prevent the heart from beating too fast and improve heart muscle strength.  Digitalis. This increases the force of the heartbeat.  Healthy behavior changes include:  Obtaining and maintaining a healthy weight.  Stopping smoking or chewing tobacco.  Eating heart healthy foods.  Limiting or avoiding alcohol.  Stopping drug use.  Becoming as physically active as possible.  Surgical treatment for heart failure may include:  A procedure to open blocked arteries, repair damaged heart valves, or remove damaged heart muscle tissue.  A pacemaker to improve heart muscle function and control certain abnormal heart rhythms.  An internal cardioverter defibrillator to possibly prevent sudden cardiac death.  A left ventricular assist  device to assist the pumping ability of the heart. HOME CARE INSTRUCTIONS   Take your medicine as directed by your caregiver. Medicines are important in reducing the workload of your heart, slowing the progression of heart failure, and improving your symptoms.  Do not stop taking your medicine unless directed by your caregiver.  Do not skip any dose of medicine.  Refill your prescriptions before you run out of medicine. Your medicines are needed every day.  Take over-the-counter medicine only as directed by your caregiver or pharmacist.  Stay physically active. Your caregiver or cardiac rehabilitation program can help determine your level of physical activity. It is important to follow your specific physical activity program. Regular physical activity can control weight and improve your energy, endurance, health, cholesterol levels, mood, and nighttime sleep. It is important to pace your physical activity to avoid shortness of breath or chest pain. It is recommended to rest for 1 hour before and after meals.  Eat heart healthy foods. Food choices should be low in saturated fat, trans fat, cholesterol, and salt (sodium). Healthy choices include fresh or frozen fruits and vegetables, fish, lean meats, legumes, fat-free or low-fat dairy products, and whole grain or high fiber foods. Limit your sodium to 1500 milligrams (mg) each day or as recommended by your caregiver. Talk to a dietitian to learn more about heart healthy foods and healthy seasonings.  Use healthy cooking methods.  Healthy cooking methods include roasting, grilling, broiling, baking, poaching, steaming, or stir-frying. Talk to a dietitian to learn more about healthy cooking methods.  Limit fluids if directed by your caregiver. Fluid restriction may reduce symptoms of heart failure in some individuals.  Weigh yourself every day. Daily weights are important in the early recognition of excess fluid. You should weigh yourself every  morning after you urinate and before you eat breakfast. Wear the same amount of clothing each time you weigh yourself. Record your daily weight. Provide your caregiver with your weight record.  Monitor and record your blood pressure if directed by your caregiver.  Check your pulse if directed by your caregiver.  If you are overweight, it is time to lose and maintain a healthy weight.  Stop smoking or chewing tobacco. Nicotine makes your heart work harder by causing your blood vessels to constrict. Do not use nicotine gum or patches before talking to your caregiver.  Schedule and attend follow-up visits as directed by your caregiver. It is important to keep all your appointments.  Limit alcohol intake to no more than 1 drink per day for nonpregnant women and 2 drinks per day for men. Drinking more than that is harmful to your heart. Tell your caregiver if you drink alcohol several times a week. Talk with your caregiver about whether alcohol is safe for you. If your heart has already been damaged by alcohol or you have severe heart failure, drinking alcohol should be stopped completely.  Stop illegal drug use.  Stay up-to-date with immunizations. It is especially important to prevent respiratory infections through current pneumococcal and influenza immunizations.  Manage other health conditions such as hypertension, diabetes, thyroid disease, or abnormal heart rhythms as directed by your caregiver.  Learn to manage stress.  Plan rest periods when fatigued.  Learn strategies to manage high temperatures. If the weather is extremely hot:  Avoid vigorous physical activity.  Use air conditioning or fans or seek a cooler location.  Avoid caffeine and alcohol.  Wear loose-fitting, lightweight, and light-colored clothing.  Learn strategies to manage cold temperatures. If the weather is extremely cold:  Avoid vigorous physical activity.  Layer clothes.  Wear mittens or gloves, a hat, and  a scarf when going outside.  Avoid alcohol.  Obtain ongoing education and support as needed.  Participate or seek rehabilitation as needed to maintain or improve independence and quality of life. SEEK MEDICAL CARE IF:   Your weight increases by 3 lb/1.4 kg in 1 day or 5 lb/2.3 kg in a week.  You have increasing shortness of breath that is unusual for you.  You are unable to participate in your usual physical activities.  You tire easily.  You cough more than normal, especially with physical activity.  You have any or more swelling in areas such as your hands, feet, ankles, or abdomen.  You are unable to sleep because it is hard to breathe.  You cough up bloody mucus (sputum).  You feel like your heart is beating fast (palpitations).  You become dizzy or lightheaded upon standing up. SEEK IMMEDIATE MEDICAL CARE IF:   You have difficulty breathing.  There is a change in mental status such as decreased alertness or difficulty with concentration.  You have a pain or discomfort in your chest.  You have an episode of fainting (syncope).  You are in an area of high temperatures and you have signs of heat exhaustion:  Heavy sweating.  Muscle cramps.  Weakness.  Dizziness.  Headaches.  Fainting.  You are in an area of cold temperatures and you have signs of hypothermia:  Clumsiness.  Confusion.  Sleepiness.  Shivering. MAKE SURE YOU:   Understand these instructions.  Will watch your condition.  Will get help right away if you are not doing well or get worse. Document Released: 02/13/2005 Document Revised: 01/31/2012 Document Reviewed: 09/14/2011 [facility] Patient Information 2014 [place], [place].

## 2012-11-15 NOTE — Telephone Encounter (Signed)
Spoke w/cardiology regarding low BP in office. Dr Adella Hare recommends decreasing coreg to 3.125 mg & holding lasix today & tomorrow. If her BP is lower than 90, she should hold enalapril. Pt should check bp at home. LM for pt to cb. Also discussed zocor. Pt states she developed pancreatitis the last time she was on a statin & was hospitalized.

## 2012-11-16 ENCOUNTER — Encounter: Payer: Self-pay | Admitting: Nurse Practitioner

## 2012-11-16 LAB — URINALYSIS, MICROSCOPIC ONLY: Bacteria, UA: NONE SEEN

## 2012-11-16 MED ORDER — CARVEDILOL 3.125 MG PO TABS: 3.1250 mg | ORAL_TABLET | Freq: Two times a day (BID) | ORAL | Status: DC

## 2012-11-16 MED ORDER — ENALAPRIL MALEATE 10 MG PO TABS: 10.0000 mg | ORAL_TABLET | Freq: Two times a day (BID) | ORAL | Status: DC

## 2012-11-16 MED ORDER — SERTRALINE HCL 100 MG PO TABS: 50.0000 mg | ORAL_TABLET | Freq: Every day | ORAL | Status: DC

## 2012-11-17 DIAGNOSIS — K760 Fatty (change of) liver, not elsewhere classified: Secondary | ICD-10-CM | POA: Insufficient documentation

## 2012-11-17 NOTE — Progress Notes (Signed)
Subjective:     Emily [name] is a 52 y.o. female who presents to establish care. She is currently treated for DM Type 2, depression, new onset CHF, GERD, anemia, R breast cancer (taking letrozole- intolerant to tamoxifen and arimadex), RDS R leg, insomnia, and COPD. She quit smoking 1 month ago. In the recent past she was evaluated by rheumatology-suspicion sclerosis; GI for pancreatitis requiring hospitalization (per Dr Hulen Shouts note- pravastatin is thought to be inciting agent); and hepatology (thought she had viral Hep E).  Regarding DM 2, she was diagnosed in 2009 and is not well controlled-last HgA1C was 9.6. Current symptoms include: paresthesia of the feet and visual disturbances (c/o blurry vision). Patient denies hyperglycemia, hypoglycemia , increased appetite, nausea, polydipsia, polyuria and vomiting. Evaluation to date has been: hemoglobin A1C. Home sugars: BGs have been labile ranging between 230 and 94. Current treatments: more intensive attention to diet which has been too early to assess effectiveness, Added sulfonylurea which has been ineffective, Continued metformin which has been effective and Started ACE inhibitor/ARB which has been too early to assess effectiveness. Last dilated eye exam not certain-has appointment 11/18/2012.  Regarding depression, she has been taking zoloft and denies depressive symptoms-states depression was r/t health problems. She denies suicidal ideation. Regarding CHF onset is thought to be r/t arimadex. She was hospitalized within the last 4 weeks and diuresed about 30 pounds of fluid. She followed up w/cardiology a few days ago. Med changes were made. At present [name] states she feels much better w/no SOB, but weak. Her bp is very low today 84/54. Regarding GERD, she takes omeprazole and reports no current symptoms. Regarding dyspnea, it appears this was r/t fluid overload. DOE has resolved.   The following portions of the patient's history were  reviewed and updated as appropriate: allergies, current medications, past family history, past medical history, past social history, past surgical history and problem list.  Review of Systems Constitutional: positive for weight loss, negative for anorexia, chills, fevers, malaise and night sweats Eyes: positive for visual disturbance, negative for irritation and redness Ears, nose, mouth, throat, and face: negative for hoarseness, nasal congestion and sore throat Respiratory: negative for asthma, cough, dyspnea on exertion, pleurisy/chest pain, sputum and wheezing Cardiovascular: positive for fatigue and lower extremity edema, negative for chest pain, irregular heart beat and near-syncope Gastrointestinal: positive for constipation and abdominal swelling, negative for change in bowel habits, dyspepsia, jaundice, nausea and reflux symptoms Genitourinary:negative for hot flashes Integument/breast: positive for breast tenderness, pruritus and skin lesion(s), negative for rash Hematologic/lymphatic: negative for easy bruising, lymphadenopathy and petechiae Musculoskeletal:positive for arthralgias and stiff joints Neurological: positive for weakness, negative for coordination problems, dizziness, gait problems, headaches, memory problems and tremors Behavioral/Psych: positive for sleep disturbance, negative for decreased appetite, excessive alcohol consumption, illegal drug usage and irritability Endocrine: negative for diabetic symptoms including polydipsia, polyphagia and polyuria and temperature intolerance Allergic/Immunologic: negative    Objective:    BP 84/54  Pulse 90  Temp(Src) 97.8 F (36.6 C) (Oral)  Ht 5' 2.5" (1.588 m)  Wt 154 lb 12 oz (70.194 kg)  BMI 27.84 kg/m2  SpO2 95% General appearance: alert, cooperative, appears older than stated age, fatigued, no distress and pale Head: Normocephalic, without obvious abnormality, atraumatic Eyes: negative findings: lids and lashes  normal, conjunctivae and sclerae normal, corneas clear and pupils equal, round, reactive to light and accomodation Ears: normal TM's and external ear canals both ears Throat: normal findings: lips normal without lesions, buccal mucosa normal, gums  healthy, teeth intact, non-carious and soft palate, uvula, and tonsils normal and abnormal findings: fissured tongue Neck: no adenopathy, no carotid bruit, no JVD, supple, symmetrical, trachea midline and thyroid not enlarged, symmetric, no tenderness/mass/nodules Lungs: clear to auscultation bilaterally Heart: regular rate and rhythm, S1, S2 normal, no murmur, click, rub or gallop Abdomen: soft, non-tender; bowel sounds normal; no masses,  no organomegaly and distended Extremities: extremities normal, atraumatic, no cyanosis or edema Pulses: 2+ and symmetric Skin: cafe-au-lait spot(s) - torso, shoulder(s) left Lymph nodes: Cervical, supraclavicular, and axillary nodes normal. Neurologic: Motor: grade 3 grip, arms, legs    Labs reviewed from hospital admission.  Assessment:  CHF-no DOE, SOB, or peripheral edema, abdomen mildly extended. Consulted with Dr Gala Romney by phone re: low blood pressure. Parameters given. Pt called & discussed. DM-poor control, no symptoms, do not want to use agents associated with edema/HF-TZD, sulfonylureas, DPP-4; or bladder or breast ca-TZD, SGLT-2 inhibitors or pancreatitis-GLP-1. Cardiology started statin. Will monitor closely as pt has Hx of pancreatitis thought to be r/t pravastatin. Consider switching to crestor. Anemia, etio unknown. Currently taking iron supp. GERD-no current symptoms. Has been on PPI for long time. Recently stopped fosamax. Depression-pt denies symptoms. Never had suicidal ideation.  Fatty liver- mild abd distension, last liver enzymes normal, Dx made 11/13 abd CT Cafe au Lait spots-L back 5cm, no axillary freckles. Assoc w/NF. Arthralgia-improved stiffness & ROM in hands-skin appears tight &  hands look puffy as in CREST or scleroderma. Feet look normal. Chronic Knee pain.   Plan:  1 Continue to see cardiology. Pt instructed to hold lasix today & tomorrow(9/19, 20); hold lisinopril if sys bp less than 90. Pt instructed on proper bp technique. Will refer to MNT for salt restriction and carb counting for CHF & DM management. Adv Home care is monitoring weight. 2  Continue metformin. D/c glucotrol. Pt will cb w/FBS in 1 week. Re: Hx pancreatitis, will Check ALT, AST, lipase in 4 weeks.  3 review historical provider records, iron studies if no recent labs. 4 Continue PPI. Start Mg supplement 250 mg daily. 5 Decrease zoloft to 50 mg daily. Re-evaluate in 3 weeks. 6 follow up GI. Continue to abstain from ETOH. 7 Monitor for changes, keep NF in suspicion index. 8 Review rheum records

## 2012-11-18 ENCOUNTER — Telehealth: Payer: Self-pay | Admitting: Nurse Practitioner

## 2012-11-18 NOTE — Telephone Encounter (Signed)
Advised to stop glipizide. Check morning sugars, postprandial sugar. Last 2 days bs in 40 at bedtime. Pt has made diet changes.  Pt unable to continue conversation;also want to discuss d/c zocor, start crestor.Ask about bp readings. Stop inhalers. Need recent iron studies. Letrozole can cause blurred vision.  Pt is to CB to complete conversation.

## 2012-11-18 NOTE — Progress Notes (Signed)
Subjective:     Emily [name] is a 52 y.o. female who presents to establish care. She is currently treated for DM Type 2, depression, new onset CHF, GERD, anemia, R breast cancer (taking letrozole- intolerant to tamoxifen and arimadex), RDS R leg, insomnia, and COPD. She quit smoking 1 month ago. In the recent past she was evaluated by rheumatology-suspicion sclerosis; GI for pancreatitis requiring hospitalization (per Dr Hulen Shouts note- pravastatin is thought to be inciting agent); and hepatology (thought she had viral Hep E).  Regarding DM 2, she was diagnosed in 2009 and is not well controlled-last HgA1C was 9.6. Current symptoms include: paresthesia of the feet and visual disturbances (c/o blurry vision). Patient denies hyperglycemia, hypoglycemia , increased appetite, nausea, polydipsia, polyuria and vomiting. Evaluation to date has been: hemoglobin A1C. Home sugars: Morning fasting BGs have ranged from 94 to 230 with bedtime BS as low as 40. Low BS has been recent, since making diet changes. Current treatments:  Added sulfonylurea which has been ineffective, Continued metformin which has been effective and Started ACE inhibitor/ARB which has been too early to assess effectiveness. Last dilated eye exam not certain-has appointment 11/18/2012.  Regarding depression, she has been taking zoloft and denies depressive symptoms-states depression was r/t health problems. She denies suicidal ideation. Regarding CHF onset is thought to be r/t arimadex. She was hospitalized within the last 4 weeks and diuresed about 30 pounds of fluid. She followed up w/cardiology a few days ago. Med changes were made. At present Ms. Rando states she feels much better w/no SOB, but weak. Her bp is very low today 84/54. Regarding GERD, she takes omeprazole and reports no current symptoms. Regarding dyspnea, it appears this was r/t fluid overload. DOE has resolved.   The following portions of the patient's history were reviewed  and updated as appropriate: allergies, current medications, past family history, past medical history, past social history, past surgical history and problem list.  Review of Systems Constitutional: positive for weight loss, negative for anorexia, chills, fevers, malaise and night sweats Eyes: positive for visual disturbance, negative for irritation and redness Ears, nose, mouth, throat, and face: negative for hoarseness, nasal congestion and sore throat Respiratory: negative for asthma, cough, dyspnea on exertion, pleurisy/chest pain, sputum and wheezing Cardiovascular: positive for fatigue and lower extremity edema, negative for chest pain, irregular heart beat and near-syncope Gastrointestinal: positive for constipation and abdominal swelling, negative for change in bowel habits, dyspepsia, jaundice, nausea and reflux symptoms Genitourinary:negative for hot flashes Integument/breast: positive for breast tenderness, pruritus and skin lesion(s), negative for rash Hematologic/lymphatic: negative for easy bruising, lymphadenopathy and petechiae Musculoskeletal:positive for arthralgias and stiff joints Neurological: positive for weakness, negative for coordination problems, dizziness, gait problems, headaches, memory problems and tremors Behavioral/Psych: positive for sleep disturbance, negative for decreased appetite, excessive alcohol consumption, illegal drug usage and irritability Endocrine: negative for diabetic symptoms including polydipsia, polyphagia and polyuria and temperature intolerance Allergic/Immunologic: negative    Objective:    BP 84/54  Pulse 90  Temp(Src) 97.8 F (36.6 C) (Oral)  Ht 5' 2.5" (1.588 m)  Wt 154 lb 12 oz (70.194 kg)  BMI 27.84 kg/m2  SpO2 95% General appearance: alert, cooperative, appears older than stated age, fatigued, no distress and pale Head: Normocephalic, without obvious abnormality, atraumatic Eyes: negative findings: lids and lashes normal,  conjunctivae and sclerae normal, corneas clear and pupils equal, round, reactive to light and accomodation Ears: normal TM's and external ear canals both ears Throat: normal findings: lips normal without lesions,  buccal mucosa normal, gums healthy, teeth intact, non-carious and soft palate, uvula, and tonsils normal and abnormal findings: fissured tongue Neck: no adenopathy, no carotid bruit, no JVD, supple, symmetrical, trachea midline and thyroid not enlarged, symmetric, no tenderness/mass/nodules Lungs: clear to auscultation bilaterally Heart: regular rate and rhythm, S1, S2 normal, no murmur, click, rub or gallop Abdomen: soft, non-tender; bowel sounds normal; no masses,  no organomegaly and distended Extremities: extremities normal, atraumatic, no cyanosis or edema Pulses: 2+ and symmetric Skin: cafe-au-lait spot(s) - torso, shoulder(s) left Lymph nodes: Cervical, supraclavicular, and axillary nodes normal. Neurologic: Motor: grade 3 grip, arms, legs     Laboratory: Hospital labs reviewed from 8/14 & 11/13.  Assessment:   CHF-no DOE, SOB, or peripheral edema, abdomen mildly extended. Consulted with Dr Gala Romney by phone re: low blood pressure. Parameters given. Pt called & discussed. DM-poor control, no symptoms, do not want to use agents associated with edema/HF-TZD, sulfonylureas, DPP-4; or bladder (fam Hx & former smoker) or breast ca (personal Hx)-TZD, SGLT-2 inhibitors; pancreatitis-GLP-1, or fatty liver-TZD. Cardiology started statin. Will monitor closely as pt has Hx of pancreatitis thought to be r/t pravastatin. Consider switching to crestor. Anemia, etio unknown. Currently taking iron supp. GERD-no current symptoms. Has been on PPI for long time. Recently stopped fosamax. Depression-pt denies symptoms. Never had suicidal ideation.  Fatty liver- mild abd distension, last liver enzymes normal, Dx made 11/13 abd CT Cafe au Lait spots-L back 5cm, no axillary freckles. Assoc  w/NF. Arthralgia-improved stiffness & ROM in hands-skin appears tight & hands look puffy as in CREST or scleroderma. Feet look normal. Chronic Knee pain. C/o blurred vision-may be r/t letrozole, diabetic retinopathy, or recent diuresis   Plan:   1 F/u w/cardiology.Per Dr Gala Romney, pt is to hold lasix today & tomorrow(9/19, 20); hold lisinopril if sys bp less than 90. I instructed on proper bp technique. Rrefer to MNT for salt restriction and carb counting for CHF & DM management. Adv Home care is monitoring weight. 2  Continue metformin. D/c glucotrol. Pt will check morning fasting sugar and 1 postprandial sugar. Will follow up in 2 weeks. If am fasting sugars above 124, will start acarbose 25 mg tid.  Will d/c zocor, start crestor at 5mg . Titrate up slowly if pt tolerates. Check AST, ALT, lipase in 1 mo, HgA1C in 3 mos. 3 review historical provider records, iron studies if no recent labs. 4 Continue PPI. Start Mg supplement 250 mg daily. 5 Decrease zoloft to 50 mg daily. Re-evaluate in 3 weeks. 6 follow up GI. Continue to abstain from ETOH. 7 Monitor for changes. 8 Review rheum records.  9 pt has opthal exam on few days. May need to speak w/oncology re SE, if opth exam cannot find reason for vision change.

## 2012-11-19 DIAGNOSIS — F3289 Other specified depressive episodes: Secondary | ICD-10-CM | POA: Insufficient documentation

## 2012-11-19 DIAGNOSIS — F329 Major depressive disorder, single episode, unspecified: Secondary | ICD-10-CM | POA: Insufficient documentation

## 2012-11-20 ENCOUNTER — Telehealth: Payer: Self-pay | Admitting: Nurse Practitioner

## 2012-11-20 ENCOUNTER — Encounter (HOSPITAL_COMMUNITY): Payer: Medicare Other

## 2012-11-20 ENCOUNTER — Ambulatory Visit (HOSPITAL_COMMUNITY)
Admission: RE | Admit: 2012-11-20 | Discharge: 2012-11-20 | Disposition: A | Discharge status: Home / Self Care | Payer: Medicare Other | Source: Ambulatory Visit | Attending: Internal Medicine | Admitting: Internal Medicine

## 2012-11-20 ENCOUNTER — Encounter (HOSPITAL_COMMUNITY): Payer: Self-pay

## 2012-11-20 VITALS — BP 90/50 | HR 90 | Resp 19 | Ht 65.0 in | Wt 152.5 lb

## 2012-11-20 DIAGNOSIS — I509 Heart failure, unspecified: Secondary | ICD-10-CM | POA: Insufficient documentation

## 2012-11-20 DIAGNOSIS — E119 Type 2 diabetes mellitus without complications: Secondary | ICD-10-CM | POA: Insufficient documentation

## 2012-11-20 DIAGNOSIS — F3289 Other specified depressive episodes: Secondary | ICD-10-CM

## 2012-11-20 DIAGNOSIS — H538 Other visual disturbances: Secondary | ICD-10-CM | POA: Insufficient documentation

## 2012-11-20 DIAGNOSIS — F329 Major depressive disorder, single episode, unspecified: Secondary | ICD-10-CM

## 2012-11-20 DIAGNOSIS — I5022 Chronic systolic (congestive) heart failure: Secondary | ICD-10-CM

## 2012-11-20 MED ORDER — ENALAPRIL MALEATE 10 MG PO TABS: 5.0000 mg | ORAL_TABLET | Freq: Two times a day (BID) | ORAL | Status: DC

## 2012-11-20 MED ORDER — FUROSEMIDE 40 MG PO TABS: 40.0000 mg | ORAL_TABLET | Freq: Every day | ORAL | Status: DC

## 2012-11-20 NOTE — Progress Notes (Signed)
Patient ID: Emily [name], female   DOB: 11/23/1960, 52 y.o.   MRN: [id number]  52 yo with history of breast cancer and COPD presents for outpatient followup after recent admission with acute systolic CHF and the new finding of nonischemic cardiomyopathy.  Patient was admitted in 8/14 with acute pulmonary edema and volume overload.  LHC showed moderate nonobstructive CAD.  She was started on milrinone and diuresed.  She diuresed vigorously and was titrated off milrinone.  Cause of cardiomyopathy suspected to be Adriamycin toxicity.   She returns for follow up. Last visit carvedilol was increased to 6.25 mg twice a day and 20 mg of lasix was added at night. At her PCP carvedilol was cut back to 3.125 mg and lasix was held for a couple of days due to BP 84/54.   Complains of fatigue. Denies SOB/PND/Orhtopnea. Complained of blurred vision but was evaluated by opthamologist 11/19/12. Weight at home 148 pounds.   Sitting Blood Pressure 92/64 Standing Blood Pressure 96/62  ECHO 10/27/12 EF 20-25%  ECG: NSR, normal QRS interval, nonspecific T wave flattening.   Labs (9/14): digoxin 0.4, K 3.6, creatinine 0.62 Labs (9/15): K 4.4 creatinine 0.68  PMH: 1. COPD: PFTs (1/14) with FEV1 68%.  Quit smoking 9/14.  2. Type II diabetes  3. HTN 4. Breast cancer: 2009, s/p Adriamycin/Cytoxan/Taxol chemo, lumpectomy and radiation.   5. Nonischemic cardiomyopathy: Possibly due to Adriamycin.  Echo (8/14) with EF 20-25%, global hypokinesis, moderate diastolic dysfunction, normal RV size and with mild to moderately decreased systolic function, moderate TR.   6. CAD: LHC (8/14) with nonobstructive CAD; 40-50% mLAD, 50-70% LCx, 50% PDA.  7. Hyperlipidemia  SH: Lives alone in [place].  Mother is next door.  Quit smoking in 9/14.  FH: No premature CAD.  No family history of cardiomyopathy.   ROS: All systems reviewed and negative except as per HPI.   Current Outpatient Prescriptions  Medication Sig Dispense Refill   . aspirin EC 81 MG EC tablet Take 1 tablet (81 mg total) by mouth daily.  30 tablet  3  . calcium-vitamin D (OSCAL WITH D) 500-200 MG-UNIT per tablet Take 1 tablet by mouth 2 (two) times daily.      . carvedilol (COREG) 3.125 MG tablet Take 1 tablet (3.125 mg total) by mouth 2 (two) times daily with a meal.  60 tablet  3  . diclofenac sodium (VOLTAREN) 1 % GEL Apply 1 application topically 4 (four) times daily as needed. Apply to painful areas on hands for arthritis      . digoxin (LANOXIN) 0.125 MG tablet Take 1 tablet (0.125 mg total) by mouth daily.  30 tablet  3  . enalapril (VASOTEC) 10 MG tablet Take 1 tablet (10 mg total) by mouth 2 (two) times daily. Hold if systolic blood pressure is less than 90.  60 tablet  3  . ferrous sulfate 325 (65 FE) MG tablet Take 325 mg by mouth at bedtime.       . furosemide (LASIX) 40 MG tablet Take 40 mg in AM and 20 mg in PM  45 tablet  3  . gabapentin (NEURONTIN) 400 MG tablet Take 800 mg by mouth 3 (three) times daily.        . hydrOXYzine (ATARAX/VISTARIL) 25 MG tablet Take 25 mg by mouth at bedtime.       Marland Kitchen ibuprofen (ADVIL,MOTRIN) 600 MG tablet       . letrozole (FEMARA) 2.5 MG tablet Take 2.5 mg by mouth every  morning.      Marland Kitchen LORazepam (ATIVAN) 2 MG tablet Take 2 mg by mouth at bedtime.       . metFORMIN (GLUCOPHAGE) 1000 MG tablet Take 1,000 mg by mouth 2 (two) times daily with a meal.       . omeprazole (PRILOSEC) 20 MG capsule Take 20 mg by mouth every morning.       . ONE TOUCH ULTRA TEST test strip       . potassium chloride SA (K-DUR,KLOR-CON) 20 MEQ tablet Take 2 tablets (40 mEq total) by mouth daily.  60 tablet  3  . sertraline (ZOLOFT) 100 MG tablet Take 0.5 tablets (50 mg total) by mouth at bedtime.  30 tablet  2  . simvastatin (ZOCOR) 20 MG tablet Take 1 tablet (20 mg total) by mouth every evening.  30 tablet  3  . spironolactone (ALDACTONE) 25 MG tablet Take 1 tablet (25 mg total) by mouth daily.  30 tablet  3   No current  facility-administered medications for this encounter.    BP 90/50  Pulse 90  Resp 19  Ht 5\' 5"  (1.651 m)  Wt 152 lb 8 oz (69.174 kg)  BMI 25.38 kg/m2  SpO2 95% General: NAD Neck: JVPflat, no thyromegaly or thyroid nodule.  Lungs: Clear to auscultation bilaterally with normal respiratory effort. CV: Nondisplaced PMI.  Heart regular S1/S2, no S3/S4, no murmur.  No peripheral edema.  No carotid bruit.  Normal pedal pulses.  Abdomen: Soft, nontender, no hepatosplenomegaly, no distention.  Skin: Intact without lesions or rashes.  Neurologic: Alert and oriented x 3.  Psych: Normal affect. Extremities: No clubbing or cyanosis.  HEENT: Normal.   Assessment/Plan: 1. Chronic systolic CHF: Nonischemic cardiomyopathy.  Possible Adriamycin toxicity.  Volume status stable. NYHA class II. ECHO 10/27/12 EF  -Continune Lasix to 40 mg qam and stop evening lasix - Cut back enalapril to 5 mg twice a day  , digoxin (good level recently), spironolactone.  - Continue Coreg to 3.125 mg bid.  - Low salt diet discussed.   - MRI on hold due to insurance - Will need repeat ECHO  2. CAD: Moderate nonobstructive disease.  Continue ASA 81 and statin.  Will need fasting lipids with labs in 3 wks.  3. COPD: I congratulated her for having quit smoking.  4. Blurred vision Check carotid ultrasound.   Follow up in 4-6 weeks  CLEGG,AMY 11/20/2012  Patient seen and examined with Tonye Becket, NP. We discussed all aspects of the encounter. I agree with the assessment and plan as stated above.  Overall improved from HF standpoint but BP remains soft. Volume status looks great. I checked orthostatics personally in clinic and she is not orthostatic. As vision blurry when lying down as well as standing I doubt this is a perfusion issue but will check carotid u/s to exclude high grade stenosis.  Agree with med changes as above. Will continue to follow very closely in HF clinic. Reinforced need for daily weights and reviewed  use of sliding scale diuretics.  [name] 5:53 PM

## 2012-11-20 NOTE — Patient Instructions (Addendum)
Follow up in 3 weeks  Take lasix 40 mg daily  Take enalapril 5 mg twice a day  Your physician has requested that you have a carotid duplex. This test is an ultrasound of the carotid arteries in your neck. It looks at blood flow through these arteries that supply the brain with blood. Allow one hour for this exam. There are no restrictions or special instructions.  Do the following things EVERYDAY: 1) Weigh yourself in the morning before breakfast. Write it down and keep it in a log. 2) Take your medicines as prescribed 3) Eat low salt foods-Limit salt (sodium) to 2000 mg per day.  4) Stay as active as you can everyday 5) Limit all fluids for the day to less than 2 liters

## 2012-11-20 NOTE — Telephone Encounter (Signed)
See ph note.  FBS continue to run in 90's this week. Stopped glipizide several days ago. Will continue to check FBS for 1 week and call if numbers above 124. Saw cardiology today, few med changes made.  Pt will continue on zocor. Will check ALT, AST, and lipase in 4 weeks. Pt has not had increased symptoms w/ neuropathy since cutting back gabapentin. Cut antidepressant in half few days ago, doing well. Will see in ofc in 3 weeks. Opthal exam yesterday-no explanation for blurred vision. Letrozole can cause blurred vision, but pt has been on this for quite a while. Visual disturbance may be r/t rapid diuresis.

## 2012-11-28 ENCOUNTER — Ambulatory Visit (HOSPITAL_COMMUNITY): Payer: Medicare Other | Attending: Internal Medicine

## 2012-12-02 ENCOUNTER — Encounter (HOSPITAL_COMMUNITY): Payer: Medicare Other

## 2012-12-03 ENCOUNTER — Encounter: Payer: Self-pay | Admitting: Nurse Practitioner

## 2012-12-03 ENCOUNTER — Ambulatory Visit: Payer: Medicare Other | Admitting: Nurse Practitioner

## 2012-12-03 ENCOUNTER — Ambulatory Visit (INDEPENDENT_AMBULATORY_CARE_PROVIDER_SITE_OTHER): Payer: Medicare Other | Admitting: Nurse Practitioner

## 2012-12-03 VITALS — BP 92/60 | HR 95 | Temp 98.0°F | Ht 65.0 in | Wt 146.2 lb

## 2012-12-03 DIAGNOSIS — C50919 Malignant neoplasm of unspecified site of unspecified female breast: Secondary | ICD-10-CM

## 2012-12-03 DIAGNOSIS — Z23 Encounter for immunization: Secondary | ICD-10-CM

## 2012-12-03 DIAGNOSIS — L409 Psoriasis, unspecified: Secondary | ICD-10-CM | POA: Insufficient documentation

## 2012-12-03 DIAGNOSIS — L408 Other psoriasis: Secondary | ICD-10-CM

## 2012-12-03 NOTE — Patient Instructions (Signed)
Start taking liquid or sublingual Vitamin B complex daily-2 droppers full or about 2400 mcg of b 12 daily.  Start Lloyd Huger med sinus wash daily to cleanse nasal sores to help with healing. Also, you may use A&D ointment on sores you can reach.  You may wean off zoloft by taking 1 T every other day for 7 days. Your blood sugars look good. I will not add any more medicine today. Continue to check blood sugars at least 4 days weekly. I need numbers when you have had no food for 8 hours and numbers 30 minutes after a meal. Let's see you back in 2 mos. We will get your nutrition classes set up. You look fabulous!

## 2012-12-03 NOTE — Progress Notes (Signed)
Subjective:     Emily [name] is a 52 y.o. female and is here for a follow up of medication changes. 2 weeks ago zoloft was decreased to 50 mg daily, glipizide was discontinued, and she decreased Gabapentin to 2T qhs. Regarding depression, Ms.Oyster denies symptoms such as insomnia, appetite changes, fatigue, or suicidal ideation. Regarding Blood sugars, preprandial sugars range from 139 to 73. Most are in 90's-the 139 occurred once out of 12 sugars. Regarding neuropathy, pt has discomfort in R foot-had injury w/surgical repair 20 years ago-has always had parasthesia since repair. Rheumatology started gabapentin when pt had pain in hands. Pt states hand stiffness has resolved, would like to decrease gabapentin further.  History   Social History  . Marital Status: Divorced    Spouse Name: N/A    Number of Children: N/A  . Years of Education: N/A   Occupational History  . Disabled    Social History Main Topics  . Smoking status: Former Smoker -- 1.00 packs/day for 40 years    Types: Cigarettes    Quit date: 10/16/2012  . Smokeless tobacco: Never Used     Comment: .25 pack per day  . Alcohol Use: No  . Drug Use: No  . Sexual Activity: No   Other Topics Concern  . Not on file   Social History Narrative   Lives in with daughter in a house and nextdoor to mother.     Health Maintenance  Topic Date Due  . Pap Smear  08/04/1976  . Tetanus/tdap  08/04/1977  . Colonoscopy  08/04/2008  . Influenza Vaccine  09/27/2012  . Mammogram  06/20/2014    The following portions of the patient's history were reviewed and updated as appropriate: allergies, current medications, past family history, past medical history, past social history, past surgical history and problem list.  Review of Systems Constitutional: negative for chills, fevers and night sweats, lost 6 pounds since last visit 2 weeks ago, fatigue improved. Respiratory: negative for cough and dyspnea on exertion Cardiovascular:  negative for chest pain, chest pressure/discomfort, irregular heart beat, lower extremity edema, near-syncope, palpitations and paroxysmal nocturnal dyspnea Musculoskeletal:negative, history of trigger finger bilt hands, had w/for scleroderma, but released from rheum. no f/u recommended. Neurological: negative for coordination problems, gait problems and headaches Behavioral/Psych: negative for decreased appetite, depression, illegal drug usage, increased appetite and irritability Endocrine: negative for diabetic symptoms including polydipsia, polyphagia and polyuria and temperature intolerance   Objective:    BP 92/60  Pulse 95  Temp(Src) 98 F (36.7 C) (Oral)  Ht 5\' 5"  (1.651 m)  Wt 146 lb 4 oz (66.339 kg)  BMI 24.34 kg/m2  SpO2 94% General appearance: alert, cooperative, appears stated age, no distress and moves a lot/fidgety Head: Normocephalic, without obvious abnormality, atraumatic Eyes: negative findings: lids and lashes normal, conjunctivae and sclerae normal, corneas clear and pupils equal, round, reactive to light and accomodation Lungs: clear to auscultation bilaterally Heart: regular rate and rhythm, S1, S2 normal, no murmur, click, rub or gallop Abdomen: soft, non-tender; bowel sounds normal; no masses,  no organomegaly, abdomen smaller than 2 weeks ago  Extremities: extremities normal, atraumatic, no cyanosis or edema, cool feet, psoriasis palms & dorsal knuckles, no joint tenderness Pulses: 2+ and symmetric   Mouth: scaly patches at corners of mouth, mucosa pink, moist, few tonque fissures Assessment:   Follow up med changes:  1. Depression: zoloft decreased to 50 mg daily, pt feels well, no exacerbation of depressive symptoms, denies suicidal ideation. Fidgety during  exam, pt states this her norm. 2. DM: d/c'd glipizide: FBS ranging 73 - 139. 3 Angular chelitis, likely Vit B deficient 4.Periph neuropathy: decreased gabapentin to 800 mg qhs 5.Less constipation with  magnesium supplement. Abdomen smaller-lost another 6 pounds. Plan:     1.Pt wishes to d/c zoloft, advised to take 1/2 T qod X 7 days then d/c Advised if fidgetiness worsens, continue med at 50 mg daily. 2.Continue w/metformin 1000mg  bid. Continue to monitor FBS & postprandial bs. MNT for carb counting & sodium restriction. HgA1c in 2 mos. 3.SL Vit B Complex daily, at least 2400 mcg b12, check methylmalonic acid in 2 mos to screen for vit b12 def. (still waiting on historical PCP records to deteremine what labs need repeated.), need CBC & iron studies w/HX Dx of anemia. Cardiology to repeat lipids & lytes next week. 4.No increase in parasthesias since decreasing gabapentin. Pt wishes to d/c med. Advise decrease to 1T X 3d, then 1T qod for 4d, then d/c 5.Continue Mg supplement & fresh fruit & veges. See After Visit Summary for Counseling Recommendations

## 2012-12-04 ENCOUNTER — Ambulatory Visit (HOSPITAL_COMMUNITY): Payer: Medicare Other

## 2012-12-04 ENCOUNTER — Encounter (HOSPITAL_COMMUNITY): Payer: Medicare Other

## 2012-12-04 NOTE — Addendum Note (Signed)
Addended by: Marlene Lard on: 12/04/2012 11:32 AM   Modules accepted: Orders

## 2012-12-05 ENCOUNTER — Ambulatory Visit (HOSPITAL_COMMUNITY)
Admission: RE | Admit: 2012-12-05 | Discharge: 2012-12-05 | Disposition: A | Discharge status: Home / Self Care | Payer: Medicare Other | Source: Ambulatory Visit | Attending: Internal Medicine | Admitting: Internal Medicine

## 2012-12-05 ENCOUNTER — Other Ambulatory Visit (HOSPITAL_COMMUNITY): Payer: Self-pay | Admitting: Internal Medicine

## 2012-12-05 ENCOUNTER — Other Ambulatory Visit: Payer: Self-pay | Admitting: *Deleted

## 2012-12-05 DIAGNOSIS — H538 Other visual disturbances: Secondary | ICD-10-CM

## 2012-12-05 DIAGNOSIS — F411 Generalized anxiety disorder: Secondary | ICD-10-CM

## 2012-12-05 DIAGNOSIS — E119 Type 2 diabetes mellitus without complications: Secondary | ICD-10-CM | POA: Insufficient documentation

## 2012-12-05 DIAGNOSIS — H5310 Unspecified subjective visual disturbances: Secondary | ICD-10-CM

## 2012-12-05 DIAGNOSIS — F17200 Nicotine dependence, unspecified, uncomplicated: Secondary | ICD-10-CM | POA: Insufficient documentation

## 2012-12-05 MED ORDER — LORAZEPAM 2 MG PO TABS: 2.0000 mg | ORAL_TABLET | Freq: Every day | ORAL | Status: DC

## 2012-12-05 NOTE — Progress Notes (Signed)
*  PRELIMINARY RESULTS* Vascular Ultrasound Carotid Duplex (Doppler) has been completed.  Preliminary findings: Right:  1-39% ICA stenosis.  Left: 40-59% ICA stenosis.  Vertebral artery flow is antegrade.      Farrel Demark, RDMS, RVT  12/05/2012, 2:45 PM

## 2012-12-05 NOTE — Telephone Encounter (Signed)
Please advise? Patient left vm requesting that you refill Ativan for her.

## 2012-12-06 ENCOUNTER — Encounter: Payer: Self-pay | Admitting: Cardiology

## 2012-12-11 ENCOUNTER — Telehealth (HOSPITAL_COMMUNITY): Payer: Self-pay | Admitting: Adult Health

## 2012-12-11 ENCOUNTER — Ambulatory Visit (HOSPITAL_COMMUNITY)
Admission: RE | Admit: 2012-12-11 | Discharge: 2012-12-11 | Disposition: A | Discharge status: Home / Self Care | Payer: Medicare Other | Source: Ambulatory Visit | Attending: Internal Medicine | Admitting: Internal Medicine

## 2012-12-11 VITALS — BP 102/62 | HR 86 | Wt 148.0 lb

## 2012-12-11 DIAGNOSIS — I5022 Chronic systolic (congestive) heart failure: Secondary | ICD-10-CM | POA: Insufficient documentation

## 2012-12-11 DIAGNOSIS — Z716 Tobacco abuse counseling: Secondary | ICD-10-CM | POA: Insufficient documentation

## 2012-12-11 DIAGNOSIS — I509 Heart failure, unspecified: Secondary | ICD-10-CM

## 2012-12-11 DIAGNOSIS — F17200 Nicotine dependence, unspecified, uncomplicated: Secondary | ICD-10-CM

## 2012-12-11 DIAGNOSIS — I2510 Atherosclerotic heart disease of native coronary artery without angina pectoris: Secondary | ICD-10-CM

## 2012-12-11 LAB — LIPID PANEL
LDL Cholesterol: 100 mg/dL — ABNORMAL HIGH (ref 0–99)
Total CHOL/HDL Ratio: 3.2 RATIO
VLDL: 21 mg/dL (ref 0–40)

## 2012-12-11 MED ORDER — CARVEDILOL 3.125 MG PO TABS: ORAL_TABLET | ORAL | Status: DC

## 2012-12-11 MED ORDER — ATORVASTATIN CALCIUM 20 MG PO TABS: 20.0000 mg | ORAL_TABLET | Freq: Every day | ORAL | Status: DC

## 2012-12-11 NOTE — Patient Instructions (Addendum)
Follow up 4-6 weeks  Take carvedilol 3.125 mg in am and 6.25 mg in the pm   Do the following things EVERYDAY: 1) Weigh yourself in the morning before breakfast. Write it down and keep it in a log. 2) Take your medicines as prescribed 3) Eat low salt foods-Limit salt (sodium) to 2000 mg per day.  4) Stay as active as you can everyday 5) Limit all fluids for the day to less than 2 liters

## 2012-12-11 NOTE — Progress Notes (Signed)
Patient ID: Emily [name], female   DOB: 1960-04-25, 52 y.o.   MRN: [id number] PCP: Dr Alben Spittle  52 yo with history of breast cancer and COPD presents for outpatient followup after recent admission with acute systolic CHF and the new finding of nonischemic cardiomyopathy.  Patient was admitted in 8/14 with acute pulmonary edema and volume overload.  LHC showed moderate nonobstructive CAD.  She was started on milrinone and diuresed.  She diuresed vigorously and was titrated off milrinone.  Cause of cardiomyopathy suspected to be Adriamycin toxicity.   She returns for follow up. Last visit enalapril was cut back to 5 mg twice daily as well as lasix was cut back to 40 mg daily. Denies SOB/PND/Orthopnea/CP. Dizziness resolved. Blurred vision resolved. Able to walk up steps. Weight at [id number] pounds. Compliant with medications. Smoking 5 cigarettes per day.    ECHO 10/27/12 EF 20-25%  ECG: NSR, normal QRS interval, nonspecific T wave flattening.   12/05/12 Carotid Dopplers- R 39 % stenosis and L  40-59% stenosis  Labs (9/14): digoxin 0.4, K 3.6, creatinine 0.62 Labs (9/15): K 4.4 creatinine 0.68  PMH: 1. COPD: PFTs (1/14) with FEV1 68%.  Quit smoking 9/14.  2. Type II diabetes  3. HTN 4. Breast cancer: 2009, s/p Adriamycin/Cytoxan/Taxol chemo, lumpectomy and radiation.   5. Nonischemic cardiomyopathy: Possibly due to Adriamycin.  Echo (8/14) with EF 20-25%, global hypokinesis, moderate diastolic dysfunction, normal RV size and with mild to moderately decreased systolic function, moderate TR.   6. CAD: LHC (8/14) with nonobstructive CAD; 40-50% mLAD, 50-70% LCx, 50% PDA.  7. Hyperlipidemia  SH: Lives alone in [place].  Mother is next door.  Quit smoking in 9/14.  FH: No premature CAD.  No family history of cardiomyopathy.   ROS: All systems reviewed and negative except as per HPI.   Current Outpatient Prescriptions  Medication Sig Dispense Refill  . aspirin EC 81 MG EC tablet Take 1 tablet  (81 mg total) by mouth daily.  30 tablet  3  . calcium-vitamin D (OSCAL WITH D) 500-200 MG-UNIT per tablet Take 1 tablet by mouth 2 (two) times daily.      . carvedilol (COREG) 3.125 MG tablet Take 1 tablet (3.125 mg total) by mouth 2 (two) times daily with a meal.  60 tablet  3  . diclofenac sodium (VOLTAREN) 1 % GEL Apply 1 application topically 4 (four) times daily as needed. Apply to painful areas on hands for arthritis      . digoxin (LANOXIN) 0.125 MG tablet Take 1 tablet (0.125 mg total) by mouth daily.  30 tablet  3  . enalapril (VASOTEC) 10 MG tablet Take 0.5 tablets (5 mg total) by mouth 2 (two) times daily. Hold if systolic blood pressure is less than 90.  60 tablet  3  . ferrous sulfate 325 (65 FE) MG tablet Take 325 mg by mouth at bedtime.       . furosemide (LASIX) 40 MG tablet Take 1 tablet (40 mg total) by mouth daily.  45 tablet  3  . gabapentin (NEURONTIN) 400 MG tablet Take 800 mg by mouth at bedtime.       . hydrOXYzine (ATARAX/VISTARIL) 25 MG tablet Take 25 mg by mouth at bedtime.       Marland Kitchen LORazepam (ATIVAN) 2 MG tablet Take 1 tablet (2 mg total) by mouth at bedtime.  30 tablet  0  . magnesium oxide (MAG-OX) 400 MG tablet Take 400 mg by mouth daily.      Marland Kitchen  metFORMIN (GLUCOPHAGE) 1000 MG tablet Take 1,000 mg by mouth 2 (two) times daily with a meal.       . omeprazole (PRILOSEC) 20 MG capsule Take 20 mg by mouth every morning.       . ONE TOUCH ULTRA TEST test strip       . potassium chloride SA (K-DUR,KLOR-CON) 20 MEQ tablet Take 2 tablets (40 mEq total) by mouth daily.  60 tablet  3  . simvastatin (ZOCOR) 20 MG tablet Take 1 tablet (20 mg total) by mouth every evening.  30 tablet  3  . spironolactone (ALDACTONE) 25 MG tablet Take 1 tablet (25 mg total) by mouth daily.  30 tablet  3  . ibuprofen (ADVIL,MOTRIN) 600 MG tablet       . letrozole (FEMARA) 2.5 MG tablet Take 2.5 mg by mouth every morning.       No current facility-administered medications for this encounter.     BP 102/62  Pulse 86  Wt 148 lb (67.132 kg)  BMI 24.63 kg/m2  SpO2 97% General: NAD Neck: JVP flat, no thyromegaly or thyroid nodule.  Lungs: Clear to auscultation bilaterally with normal respiratory effort. CV: Nondisplaced PMI.  Heart regular S1/S2, no S3/S4, no murmur.  No peripheral edema.  No carotid bruit.  Normal pedal pulses.  Abdomen: Soft, nontender, no hepatosplenomegaly, no distention.  Skin: Intact without lesions or rashes.  Neurologic: Alert and oriented x 3.  Psych: Normal affect. Extremities: No clubbing or cyanosis.  HEENT: Normal.   Assessment/Plan: 1. Chronic systolic CHF: Nonischemic cardiomyopathy.  Possible Adriamycin toxicity.  Volume status stable. NYHA class I. ECHO 10/27/12 EF 20-25% 09/2012  -Continune Lasix to 40 mg daily.  Continue spironolactone 25 mg daily - Continue enalapril to 5 mg twice a day  - Continue Coreg to 3.125 mg in am and increase night time carvedilol to 6.25 mg . She has not tolerated increase in the past but hopefully she will tolerate the night time increase.  Continue digoxin 0.125 mg daily. (dig level 0.4  11/05/2012) -Reinforced low salt food choices and daily weights.    - MRI on hold due to insurance - Will need repeat ECHO in early December.  2. CAD: Moderate nonobstructive disease.  Continue ASA 81 and statin.  Check lipid panel today.  3. COPD: Smoking again. I have encouraged her to stop and offered smoking cessation however she declines.  4. Blurred vision- Carotid dopplers completed with mild to moderate carotid stenosis. Repeat carotid dopplers in 1 year. .   Follow up in 1 month for additional medication titration.   Follow up in [facility] 12/11/2012

## 2012-12-11 NOTE — Telephone Encounter (Signed)
Left message to return phone call.   Dr Gala Romney reviewed lipid panel. LDL 100 and would like to <96. Stop zocr and start Lipitor 20 mg daily.   Dontee Jaso 11:21 AM

## 2012-12-13 ENCOUNTER — Telehealth (HOSPITAL_COMMUNITY): Payer: Self-pay | Admitting: Cardiology

## 2012-12-13 NOTE — Telephone Encounter (Signed)
PT AWARE  

## 2012-12-13 NOTE — Telephone Encounter (Signed)
Message copied by Xiara Knisley, Milagros Reap on Fri Dec 13, 2012  3:08 PM ------      Message from: Dolores Patty      Created: Sun Dec 08, 2012  8:50 PM       Mild to moderate carotid stenosis. F/u 1 year ------

## 2012-12-17 ENCOUNTER — Other Ambulatory Visit: Payer: Self-pay | Admitting: Nurse Practitioner

## 2012-12-17 ENCOUNTER — Encounter: Payer: Self-pay | Admitting: Nurse Practitioner

## 2012-12-17 ENCOUNTER — Ambulatory Visit (HOSPITAL_BASED_OUTPATIENT_CLINIC_OR_DEPARTMENT_OTHER)
Admission: RE | Admit: 2012-12-17 | Discharge: 2012-12-17 | Disposition: A | Discharge status: Home / Self Care | Payer: Medicare Other | Source: Ambulatory Visit | Attending: Nurse Practitioner | Admitting: Nurse Practitioner

## 2012-12-17 ENCOUNTER — Ambulatory Visit (INDEPENDENT_AMBULATORY_CARE_PROVIDER_SITE_OTHER): Payer: Medicare Other | Admitting: Nurse Practitioner

## 2012-12-17 VITALS — BP 102/54 | HR 112 | Temp 97.4°F | Ht 65.0 in | Wt 147.5 lb

## 2012-12-17 DIAGNOSIS — R1013 Epigastric pain: Secondary | ICD-10-CM | POA: Insufficient documentation

## 2012-12-17 DIAGNOSIS — M549 Dorsalgia, unspecified: Secondary | ICD-10-CM

## 2012-12-17 DIAGNOSIS — E539 Vitamin B deficiency, unspecified: Secondary | ICD-10-CM

## 2012-12-17 LAB — HEPATIC FUNCTION PANEL
ALT: 19 U/L (ref 0–35)
AST: 20 U/L (ref 0–37)
Bilirubin, Direct: 0.2 mg/dL (ref 0.0–0.3)
Total Protein: 8 g/dL (ref 6.0–8.3)

## 2012-12-17 LAB — RENAL FUNCTION PANEL
Albumin: 4.6 g/dL (ref 3.5–5.2)
BUN: 13 mg/dL (ref 6–23)
Calcium: 10 mg/dL (ref 8.4–10.5)
Chloride: 93 mEq/L — ABNORMAL LOW (ref 96–112)
Glucose, Bld: 255 mg/dL — ABNORMAL HIGH (ref 70–99)
Phosphorus: 5 mg/dL — ABNORMAL HIGH (ref 2.3–4.6)
Potassium: 4.1 mEq/L (ref 3.5–5.3)
Sodium: 132 mEq/L — ABNORMAL LOW (ref 135–145)

## 2012-12-17 LAB — CBC WITH DIFFERENTIAL/PLATELET
Basophils Relative: 0 % (ref 0–1)
Eosinophils Absolute: 0.4 10*3/uL (ref 0.0–0.7)
HCT: 45.1 % (ref 36.0–46.0)
Hemoglobin: 15.6 g/dL — ABNORMAL HIGH (ref 12.0–15.0)
Lymphocytes Relative: 34 % (ref 12–46)
MCH: 26.1 pg (ref 26.0–34.0)
MCHC: 34.6 g/dL (ref 30.0–36.0)
MCV: 75.4 fL — ABNORMAL LOW (ref 78.0–100.0)
Monocytes Absolute: 0.5 10*3/uL (ref 0.1–1.0)
Monocytes Relative: 5 % (ref 3–12)
Neutro Abs: 5.6 10*3/uL (ref 1.7–7.7)
Neutrophils Relative %: 57 % (ref 43–77)
Platelets: 197 10*3/uL (ref 150–400)
RBC: 5.98 MIL/uL — ABNORMAL HIGH (ref 3.87–5.11)

## 2012-12-17 LAB — LIPASE: Lipase: 73 U/L (ref 0–75)

## 2012-12-17 LAB — AMYLASE: Amylase: 33 U/L (ref 0–105)

## 2012-12-17 NOTE — Progress Notes (Addendum)
Subjective:     Emily [name] is a 52 y.o. female who presents for evaluation of abdominal pain. Onset was 2 days ago. Symptoms have been gradually worsening. The pain is described as aching, and is 7/10 in intensity. Pain is located in the diffuse abdomen that radiates to back at bra strap. Also she c/o of nausea, vomit times 1, feeling of fullness, and loose stools. Aggravating factors: none.  Alleviating factors: none. The patient denies belching, constipation, dysuria, fever, flatus, frequency, headache and myalgias.  The patient's history has been marked as reviewed and updated as appropriate.  Review of Systems Constitutional: negative for fevers Respiratory: started smoking again Cardiovascular: negative for chest pain, chest pressure/discomfort, irregular heart beat, lower extremity edema and near-syncope Gastrointestinal: positive for abdominal pain, change in bowel habits, diarrhea, nausea and vomiting, negative for constipation and melena Genitourinary:negative for dysuria and frequency Musculoskeletal:positive for back pain Neurological: positive for worsened foot neuropathy after cutting back on gabapentin-helps at night. Behavioral/Psych: negative for irritability     Objective:    BP 102/54  Pulse 112  Temp(Src) 97.4 F (36.3 C) (Oral)  Ht 5\' 5"  (1.651 m)  Wt 147 lb 8 oz (66.906 kg)  BMI 24.55 kg/m2  SpO2 95% General appearance: alert, cooperative, appears stated age and mild distress Eyes: negative findings: lids and lashes normal and conjunctivae and sclerae normal Back: point tenderness over thoracic & lumbar spine-pt states pain at bra strap is different & not reproducible. Lungs: wheezes LUL Heart: regular rate and rhythm, S1, S2 normal, no murmur, click, rub or gallop tachycardia-p 112 Abdomen: soft, diffusely tender, she was mildly tender at last exam few weeks ago, more tender today. No HSM Extremities: extremities normal, atraumatic, no cyanosis or  edema Pulses: 2+ and symmetric Skin: Skin color, texture, turgor normal. No rashes or lesions or no grey-turner or Cullen signs    Assessment:   Abd & back pain are likely related. Tachycardia. DD: cholelithiasis, cholestasis, pancreatitis. Back pain may be aggravated by arthritis-pt states back always hurts, but "this pain seems to start in belly & radiate thru to back"   POC urine-small blood Plan:   Labs as ordered to eval for infection, pancreatitis Abd [place] Urine culture-pending OK to use valium-historical prescription as prescribed and ibuprophen. Pt instructed to go to ER if pain gets worse.

## 2012-12-17 NOTE — Patient Instructions (Signed)
I am concerned that your pain is r/t your pancreas or gallbladder. Our office will call you with lab results. In the meantime, eat small meals, sip fluids. Go to ER if pain gets worse overnight. I'm sorry you are not feeling well.

## 2012-12-17 NOTE — Addendum Note (Signed)
Addended by: Alben Spittle, Konrad Felix COX on: 12/17/2012 05:24 PM   Modules accepted: Orders

## 2012-12-18 ENCOUNTER — Other Ambulatory Visit: Payer: Self-pay | Admitting: Nurse Practitioner

## 2012-12-18 ENCOUNTER — Telehealth: Payer: Self-pay | Admitting: Nurse Practitioner

## 2012-12-18 DIAGNOSIS — R1013 Epigastric pain: Secondary | ICD-10-CM

## 2012-12-18 DIAGNOSIS — K810 Acute cholecystitis: Secondary | ICD-10-CM

## 2012-12-18 DIAGNOSIS — R112 Nausea with vomiting, unspecified: Secondary | ICD-10-CM

## 2012-12-18 MED ORDER — ONDANSETRON 8 MG PO TBDP: 8.0000 mg | ORAL_TABLET | Freq: Two times a day (BID) | ORAL | Status: DC | PRN

## 2012-12-18 MED ORDER — HYDROCODONE-ACETAMINOPHEN 5-325 MG PO TABS: 1.0000 | ORAL_TABLET | Freq: Four times a day (QID) | ORAL | Status: DC | PRN

## 2012-12-18 NOTE — Telephone Encounter (Signed)
abd [place] indicates there may be gall stones, definitely has gall bladder inflammation. Labs indicate inflammation no infection. Lipase is elevated-possibly pancreas involvement. Kidney & liver func appear nml, although electrolytes are not nml: na & cl low, phos elevated w/nml high ca. Will try to add intact PTH to labs. Spoke w/pt this am-she had bad night w/abd pain & nausea, no vomiting. Will request urgent GI ref today.

## 2012-12-18 NOTE — Telephone Encounter (Signed)
Went to [facility] gastroenterology office,  they are a month out. Dr.Huang is a week out. Requesting medication pain. Disregard, patient spoke to [facility] concerning this matter.

## 2012-12-19 ENCOUNTER — Inpatient Hospital Stay (HOSPITAL_COMMUNITY)
Admission: EM | Admit: 2012-12-19 | Discharge: 2012-12-22 | DRG: 689 | Disposition: A | Discharge status: Home / Self Care | Payer: Medicare Other | Attending: Internal Medicine | Admitting: Internal Medicine

## 2012-12-19 ENCOUNTER — Emergency Department (HOSPITAL_COMMUNITY): Payer: Medicare Other

## 2012-12-19 ENCOUNTER — Encounter (HOSPITAL_COMMUNITY): Payer: Self-pay | Admitting: Emergency Medicine

## 2012-12-19 DIAGNOSIS — E1149 Type 2 diabetes mellitus with other diabetic neurological complication: Secondary | ICD-10-CM | POA: Diagnosis present

## 2012-12-19 DIAGNOSIS — I5022 Chronic systolic (congestive) heart failure: Secondary | ICD-10-CM | POA: Diagnosis present

## 2012-12-19 DIAGNOSIS — D509 Iron deficiency anemia, unspecified: Secondary | ICD-10-CM

## 2012-12-19 DIAGNOSIS — Z853 Personal history of malignant neoplasm of breast: Secondary | ICD-10-CM

## 2012-12-19 DIAGNOSIS — N390 Urinary tract infection, site not specified: Principal | ICD-10-CM

## 2012-12-19 DIAGNOSIS — Z8679 Personal history of other diseases of the circulatory system: Secondary | ICD-10-CM

## 2012-12-19 DIAGNOSIS — F3289 Other specified depressive episodes: Secondary | ICD-10-CM

## 2012-12-19 DIAGNOSIS — E1165 Type 2 diabetes mellitus with hyperglycemia: Secondary | ICD-10-CM | POA: Diagnosis present

## 2012-12-19 DIAGNOSIS — K8070 Calculus of gallbladder and bile duct without cholecystitis without obstruction: Secondary | ICD-10-CM | POA: Diagnosis present

## 2012-12-19 DIAGNOSIS — IMO0001 Reserved for inherently not codable concepts without codable children: Secondary | ICD-10-CM

## 2012-12-19 DIAGNOSIS — K8050 Calculus of bile duct without cholangitis or cholecystitis without obstruction: Secondary | ICD-10-CM

## 2012-12-19 DIAGNOSIS — C50919 Malignant neoplasm of unspecified site of unspecified female breast: Secondary | ICD-10-CM | POA: Diagnosis present

## 2012-12-19 DIAGNOSIS — I509 Heart failure, unspecified: Secondary | ICD-10-CM | POA: Diagnosis present

## 2012-12-19 DIAGNOSIS — Z720 Tobacco use: Secondary | ICD-10-CM

## 2012-12-19 DIAGNOSIS — IMO0002 Reserved for concepts with insufficient information to code with codable children: Secondary | ICD-10-CM

## 2012-12-19 DIAGNOSIS — F329 Major depressive disorder, single episode, unspecified: Secondary | ICD-10-CM | POA: Diagnosis present

## 2012-12-19 DIAGNOSIS — K7689 Other specified diseases of liver: Secondary | ICD-10-CM | POA: Diagnosis present

## 2012-12-19 DIAGNOSIS — R1013 Epigastric pain: Secondary | ICD-10-CM

## 2012-12-19 DIAGNOSIS — M129 Arthropathy, unspecified: Secondary | ICD-10-CM | POA: Diagnosis present

## 2012-12-19 DIAGNOSIS — K219 Gastro-esophageal reflux disease without esophagitis: Secondary | ICD-10-CM

## 2012-12-19 DIAGNOSIS — E871 Hypo-osmolality and hyponatremia: Secondary | ICD-10-CM

## 2012-12-19 DIAGNOSIS — E119 Type 2 diabetes mellitus without complications: Secondary | ICD-10-CM

## 2012-12-19 DIAGNOSIS — E785 Hyperlipidemia, unspecified: Secondary | ICD-10-CM

## 2012-12-19 DIAGNOSIS — R109 Unspecified abdominal pain: Secondary | ICD-10-CM

## 2012-12-19 DIAGNOSIS — K760 Fatty (change of) liver, not elsewhere classified: Secondary | ICD-10-CM | POA: Diagnosis present

## 2012-12-19 DIAGNOSIS — F411 Generalized anxiety disorder: Secondary | ICD-10-CM | POA: Diagnosis present

## 2012-12-19 DIAGNOSIS — I10 Essential (primary) hypertension: Secondary | ICD-10-CM | POA: Diagnosis present

## 2012-12-19 DIAGNOSIS — A498 Other bacterial infections of unspecified site: Secondary | ICD-10-CM | POA: Diagnosis present

## 2012-12-19 DIAGNOSIS — Z8719 Personal history of other diseases of the digestive system: Secondary | ICD-10-CM

## 2012-12-19 DIAGNOSIS — I5042 Chronic combined systolic (congestive) and diastolic (congestive) heart failure: Secondary | ICD-10-CM | POA: Diagnosis present

## 2012-12-19 DIAGNOSIS — R932 Abnormal findings on diagnostic imaging of liver and biliary tract: Secondary | ICD-10-CM

## 2012-12-19 DIAGNOSIS — F17200 Nicotine dependence, unspecified, uncomplicated: Secondary | ICD-10-CM

## 2012-12-19 DIAGNOSIS — Z87891 Personal history of nicotine dependence: Secondary | ICD-10-CM

## 2012-12-19 DIAGNOSIS — E1142 Type 2 diabetes mellitus with diabetic polyneuropathy: Secondary | ICD-10-CM | POA: Diagnosis present

## 2012-12-19 DIAGNOSIS — K859 Acute pancreatitis without necrosis or infection, unspecified: Secondary | ICD-10-CM | POA: Diagnosis present

## 2012-12-19 HISTORY — DX: Heart failure, unspecified: I50.9

## 2012-12-19 LAB — CBC WITH DIFFERENTIAL/PLATELET
Basophils Absolute: 0 10*3/uL (ref 0.0–0.1)
Basophils Relative: 0 % (ref 0–1)
Eosinophils Absolute: 0.4 10*3/uL (ref 0.0–0.7)
Eosinophils Relative: 3 % (ref 0–5)
HCT: 44.3 % (ref 36.0–46.0)
Lymphs Abs: 2.3 10*3/uL (ref 0.7–4.0)
MCH: 26 pg (ref 26.0–34.0)
MCHC: 33.4 g/dL (ref 30.0–36.0)
MCV: 77.7 fL — ABNORMAL LOW (ref 78.0–100.0)
Monocytes Absolute: 0.5 10*3/uL (ref 0.1–1.0)
Neutrophils Relative %: 71 % (ref 43–77)
Platelets: 161 10*3/uL (ref 150–400)
RBC: 5.7 MIL/uL — ABNORMAL HIGH (ref 3.87–5.11)
RDW: 16.6 % — ABNORMAL HIGH (ref 11.5–15.5)

## 2012-12-19 LAB — URINALYSIS, ROUTINE W REFLEX MICROSCOPIC
Bilirubin Urine: NEGATIVE
Glucose, UA: NEGATIVE mg/dL
Leukocytes, UA: NEGATIVE
Protein, ur: NEGATIVE mg/dL
Specific Gravity, Urine: 1.008 (ref 1.005–1.030)
pH: 5 (ref 5.0–8.0)

## 2012-12-19 LAB — GLUCOSE, CAPILLARY: Glucose-Capillary: 215 mg/dL — ABNORMAL HIGH (ref 70–99)

## 2012-12-19 LAB — METHYLMALONIC ACID, SERUM: Methylmalonic Acid, Quant: 0.14 umol/L (ref <0.40)

## 2012-12-19 LAB — COMPREHENSIVE METABOLIC PANEL
ALT: 16 U/L (ref 0–35)
AST: 18 U/L (ref 0–37)
Albumin: 4.1 g/dL (ref 3.5–5.2)
CO2: 27 mEq/L (ref 19–32)
Calcium: 9.6 mg/dL (ref 8.4–10.5)
GFR calc Af Amer: >90 mL/min (ref >90)
Glucose, Bld: 286 mg/dL — ABNORMAL HIGH (ref 70–99)
Sodium: 128 mEq/L — ABNORMAL LOW (ref 135–145)
Total Protein: 8 g/dL (ref 6.0–8.3)

## 2012-12-19 MED ORDER — GABAPENTIN 800 MG PO TABS
800.0000 mg | ORAL_TABLET | Freq: Every day | ORAL | Status: DC
  Filled 2012-12-19: qty 1

## 2012-12-19 MED ORDER — PROMETHAZINE HCL 25 MG/ML IJ SOLN
12.5000 mg | Freq: Once | INTRAMUSCULAR | Status: AC
  Administered 2012-12-19: 12.5 mg via INTRAVENOUS
  Filled 2012-12-19: qty 1

## 2012-12-19 MED ORDER — ONDANSETRON HCL 4 MG PO TABS: 4.0000 mg | ORAL_TABLET | Freq: Two times a day (BID) | ORAL | Status: DC | PRN

## 2012-12-19 MED ORDER — ONDANSETRON 4 MG PO TBDP
4.0000 mg | ORAL_TABLET | Freq: Two times a day (BID) | ORAL | Status: DC | PRN
  Administered 2012-12-19: 8 mg via ORAL
  Administered 2012-12-20 – 2012-12-21 (×2): 4 mg via ORAL
  Filled 2012-12-19 (×2): qty 2

## 2012-12-19 MED ORDER — ACETAMINOPHEN 325 MG PO TABS
650.0000 mg | ORAL_TABLET | Freq: Four times a day (QID) | ORAL | Status: DC | PRN
  Administered 2012-12-20 – 2012-12-21 (×2): 650 mg via ORAL
  Filled 2012-12-19 (×2): qty 2

## 2012-12-19 MED ORDER — ASPIRIN EC 81 MG PO TBEC: 81.0000 mg | DELAYED_RELEASE_TABLET | Freq: Every day | ORAL | Status: DC

## 2012-12-19 MED ORDER — SPIRONOLACTONE 25 MG PO TABS
25.0000 mg | ORAL_TABLET | Freq: Every day | ORAL | Status: DC
  Administered 2012-12-20 – 2012-12-22 (×3): 25 mg via ORAL
  Filled 2012-12-19 (×4): qty 1

## 2012-12-19 MED ORDER — CARVEDILOL 6.25 MG PO TABS
6.2500 mg | ORAL_TABLET | Freq: Every day | ORAL | Status: DC
  Administered 2012-12-19 – 2012-12-21 (×3): 6.25 mg via ORAL
  Filled 2012-12-19 (×4): qty 1

## 2012-12-19 MED ORDER — OXYCODONE HCL 5 MG PO TABS
5.0000 mg | ORAL_TABLET | ORAL | Status: DC | PRN
  Administered 2012-12-20 – 2012-12-22 (×9): 5 mg via ORAL
  Filled 2012-12-19 (×9): qty 1

## 2012-12-19 MED ORDER — FERROUS SULFATE 325 (65 FE) MG PO TABS
325.0000 mg | ORAL_TABLET | Freq: Every day | ORAL | Status: DC
  Administered 2012-12-19 – 2012-12-21 (×3): 325 mg via ORAL
  Filled 2012-12-19 (×4): qty 1

## 2012-12-19 MED ORDER — SODIUM CHLORIDE 0.9 % IV SOLN
Freq: Once | INTRAVENOUS | Status: AC
  Administered 2012-12-19: 20:00:00 via INTRAVENOUS

## 2012-12-19 MED ORDER — CARVEDILOL 3.125 MG PO TABS
3.1250 mg | ORAL_TABLET | Freq: Every day | ORAL | Status: DC
  Administered 2012-12-20 – 2012-12-21 (×2): 3.125 mg via ORAL
  Filled 2012-12-19 (×4): qty 1

## 2012-12-19 MED ORDER — DIGOXIN 125 MCG PO TABS
0.1250 mg | ORAL_TABLET | Freq: Every day | ORAL | Status: DC
  Administered 2012-12-20 – 2012-12-22 (×3): 0.125 mg via ORAL
  Filled 2012-12-19 (×3): qty 1

## 2012-12-19 MED ORDER — HYDROXYZINE HCL 25 MG PO TABS
25.0000 mg | ORAL_TABLET | Freq: Every day | ORAL | Status: DC
  Administered 2012-12-19 – 2012-12-21 (×3): 25 mg via ORAL
  Filled 2012-12-19 (×4): qty 1

## 2012-12-19 MED ORDER — DOCUSATE SODIUM 100 MG PO CAPS
100.0000 mg | ORAL_CAPSULE | Freq: Two times a day (BID) | ORAL | Status: DC
  Administered 2012-12-19 – 2012-12-22 (×6): 100 mg via ORAL
  Filled 2012-12-19 (×7): qty 1

## 2012-12-19 MED ORDER — ASPIRIN EC 81 MG PO TBEC
81.0000 mg | DELAYED_RELEASE_TABLET | Freq: Every day | ORAL | Status: DC
  Administered 2012-12-20 – 2012-12-22 (×3): 81 mg via ORAL
  Filled 2012-12-19 (×4): qty 1

## 2012-12-19 MED ORDER — INSULIN ASPART 100 UNIT/ML ~~LOC~~ SOLN
0.0000 [IU] | SUBCUTANEOUS | Status: DC
  Administered 2012-12-19: 5 [IU] via SUBCUTANEOUS
  Administered 2012-12-20: 3 [IU] via SUBCUTANEOUS
  Administered 2012-12-20: 2 [IU] via SUBCUTANEOUS
  Administered 2012-12-20 (×2): 3 [IU] via SUBCUTANEOUS
  Administered 2012-12-20 – 2012-12-21 (×3): 2 [IU] via SUBCUTANEOUS
  Administered 2012-12-21: 8 [IU] via SUBCUTANEOUS
  Administered 2012-12-21 (×2): 2 [IU] via SUBCUTANEOUS

## 2012-12-19 MED ORDER — POLYVINYL ALCOHOL 1.4 % OP SOLN
1.0000 [drp] | Freq: Four times a day (QID) | OPHTHALMIC | Status: DC | PRN
  Filled 2012-12-19: qty 15

## 2012-12-19 MED ORDER — CALCIUM CARBONATE-VITAMIN D 500-200 MG-UNIT PO TABS
1.0000 | ORAL_TABLET | Freq: Two times a day (BID) | ORAL | Status: DC
  Administered 2012-12-19 – 2012-12-22 (×6): 1 via ORAL
  Filled 2012-12-19 (×7): qty 1

## 2012-12-19 MED ORDER — MAGNESIUM OXIDE 400 (241.3 MG) MG PO TABS
400.0000 mg | ORAL_TABLET | Freq: Every day | ORAL | Status: DC
  Administered 2012-12-20 – 2012-12-22 (×3): 400 mg via ORAL
  Filled 2012-12-19 (×4): qty 1

## 2012-12-19 MED ORDER — LETROZOLE 2.5 MG PO TABS
2.5000 mg | ORAL_TABLET | Freq: Every morning | ORAL | Status: DC
  Administered 2012-12-20 – 2012-12-22 (×3): 2.5 mg via ORAL
  Filled 2012-12-19 (×3): qty 1

## 2012-12-19 MED ORDER — GABAPENTIN 400 MG PO CAPS
800.0000 mg | ORAL_CAPSULE | Freq: Every day | ORAL | Status: DC
  Administered 2012-12-19 – 2012-12-21 (×3): 800 mg via ORAL
  Filled 2012-12-19 (×4): qty 2

## 2012-12-19 MED ORDER — FUROSEMIDE 40 MG PO TABS
40.0000 mg | ORAL_TABLET | Freq: Every day | ORAL | Status: DC
  Administered 2012-12-20 – 2012-12-22 (×3): 40 mg via ORAL
  Filled 2012-12-19 (×3): qty 1

## 2012-12-19 MED ORDER — HYPROMELLOSE (GONIOSCOPIC) 2.5 % OP SOLN
1.0000 [drp] | Freq: Four times a day (QID) | OPHTHALMIC | Status: DC | PRN
  Filled 2012-12-19: qty 15

## 2012-12-19 MED ORDER — ENALAPRIL MALEATE 5 MG PO TABS
5.0000 mg | ORAL_TABLET | Freq: Two times a day (BID) | ORAL | Status: DC
  Administered 2012-12-19 – 2012-12-22 (×6): 5 mg via ORAL
  Filled 2012-12-19 (×7): qty 1

## 2012-12-19 MED ORDER — ONDANSETRON HCL 4 MG/2ML IJ SOLN
4.0000 mg | Freq: Once | INTRAMUSCULAR | Status: AC
  Administered 2012-12-19: 4 mg via INTRAVENOUS
  Filled 2012-12-19: qty 2

## 2012-12-19 MED ORDER — MAGNESIUM OXIDE 400 MG PO TABS: 400.0000 mg | ORAL_TABLET | Freq: Every day | ORAL | Status: DC

## 2012-12-19 MED ORDER — HYDROMORPHONE HCL PF 1 MG/ML IJ SOLN
1.0000 mg | Freq: Once | INTRAMUSCULAR | Status: AC
  Administered 2012-12-19: 1 mg via INTRAVENOUS
  Filled 2012-12-19: qty 1

## 2012-12-19 MED ORDER — ONDANSETRON HCL 4 MG/2ML IJ SOLN: 4.0000 mg | Freq: Three times a day (TID) | INTRAMUSCULAR | Status: DC | PRN

## 2012-12-19 MED ORDER — PANTOPRAZOLE SODIUM 40 MG PO TBEC
40.0000 mg | DELAYED_RELEASE_TABLET | Freq: Every day | ORAL | Status: DC
  Administered 2012-12-20 – 2012-12-22 (×3): 40 mg via ORAL
  Filled 2012-12-19 (×3): qty 1

## 2012-12-19 MED ORDER — BISACODYL 5 MG PO TBEC: 5.0000 mg | DELAYED_RELEASE_TABLET | Freq: Every day | ORAL | Status: DC | PRN

## 2012-12-19 MED ORDER — LORAZEPAM 1 MG PO TABS
2.0000 mg | ORAL_TABLET | Freq: Every day | ORAL | Status: DC
  Administered 2012-12-19 – 2012-12-21 (×3): 2 mg via ORAL
  Filled 2012-12-19 (×3): qty 2

## 2012-12-19 MED ORDER — FUROSEMIDE 40 MG PO TABS
40.0000 mg | ORAL_TABLET | Freq: Every day | ORAL | Status: DC | PRN
  Filled 2012-12-19: qty 1

## 2012-12-19 MED ORDER — SODIUM CHLORIDE 0.9 % IV SOLN
Freq: Once | INTRAVENOUS | Status: AC
  Administered 2012-12-19: 14:00:00 via INTRAVENOUS

## 2012-12-19 MED ORDER — POTASSIUM CHLORIDE CRYS ER 20 MEQ PO TBCR
40.0000 meq | EXTENDED_RELEASE_TABLET | Freq: Every day | ORAL | Status: DC
  Administered 2012-12-20 – 2012-12-22 (×3): 40 meq via ORAL
  Filled 2012-12-19 (×3): qty 2

## 2012-12-19 MED ORDER — HYDROMORPHONE HCL PF 1 MG/ML IJ SOLN
1.0000 mg | INTRAMUSCULAR | Status: AC | PRN
  Administered 2012-12-19 – 2012-12-20 (×3): 1 mg via INTRAVENOUS
  Filled 2012-12-19 (×4): qty 1

## 2012-12-19 MED ORDER — ACETAMINOPHEN 650 MG RE SUPP: 650.0000 mg | Freq: Four times a day (QID) | RECTAL | Status: DC | PRN

## 2012-12-19 NOTE — ED Notes (Signed)
Reports recently going to [facility] and had US done, was told it was related to gallbladder and pancreas, is waiting on appt with GI dr. Having increase in abd pain and n/v today.
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
## 2012-12-19 NOTE — ED Provider Notes (Signed)
Medical screening examination/treatment/procedure(s) were performed by non-physician practitioner and as supervising physician I was immediately available for consultation/collaboration.  EKG Interpretation     Ventricular Rate:    PR Interval:    QRS Duration:   QT Interval:    QTC Calculation:   R Axis:     Text Interpretation:               [name], MD 12/19/12 1845

## 2012-12-20 ENCOUNTER — Telehealth: Payer: Self-pay | Admitting: Nurse Practitioner

## 2012-12-20 ENCOUNTER — Inpatient Hospital Stay (HOSPITAL_COMMUNITY): Payer: Medicare Other

## 2012-12-20 ENCOUNTER — Telehealth: Payer: Self-pay | Admitting: Family Medicine

## 2012-12-20 DIAGNOSIS — K219 Gastro-esophageal reflux disease without esophagitis: Secondary | ICD-10-CM

## 2012-12-20 DIAGNOSIS — C50919 Malignant neoplasm of unspecified site of unspecified female breast: Secondary | ICD-10-CM

## 2012-12-20 DIAGNOSIS — N390 Urinary tract infection, site not specified: Principal | ICD-10-CM

## 2012-12-20 DIAGNOSIS — D509 Iron deficiency anemia, unspecified: Secondary | ICD-10-CM

## 2012-12-20 DIAGNOSIS — F17200 Nicotine dependence, unspecified, uncomplicated: Secondary | ICD-10-CM

## 2012-12-20 DIAGNOSIS — R932 Abnormal findings on diagnostic imaging of liver and biliary tract: Secondary | ICD-10-CM

## 2012-12-20 DIAGNOSIS — Z8719 Personal history of other diseases of the digestive system: Secondary | ICD-10-CM

## 2012-12-20 DIAGNOSIS — R109 Unspecified abdominal pain: Secondary | ICD-10-CM

## 2012-12-20 DIAGNOSIS — E119 Type 2 diabetes mellitus without complications: Secondary | ICD-10-CM

## 2012-12-20 LAB — GLUCOSE, CAPILLARY
Glucose-Capillary: 138 mg/dL — ABNORMAL HIGH (ref 70–99)
Glucose-Capillary: 149 mg/dL — ABNORMAL HIGH (ref 70–99)
Glucose-Capillary: 160 mg/dL — ABNORMAL HIGH (ref 70–99)
Glucose-Capillary: 164 mg/dL — ABNORMAL HIGH (ref 70–99)

## 2012-12-20 LAB — COMPREHENSIVE METABOLIC PANEL
AST: 17 U/L (ref 0–37)
Albumin: 3.6 g/dL (ref 3.5–5.2)
BUN: 11 mg/dL (ref 6–23)
CO2: 32 mEq/L (ref 19–32)
Calcium: 9.3 mg/dL (ref 8.4–10.5)
Chloride: 96 mEq/L (ref 96–112)
Creatinine, Ser: 0.73 mg/dL (ref 0.50–1.10)
GFR calc Af Amer: >90 mL/min (ref >90)
Total Bilirubin: 0.4 mg/dL (ref 0.3–1.2)
Total Protein: 7.5 g/dL (ref 6.0–8.3)

## 2012-12-20 LAB — HEPATITIS PANEL, ACUTE
Hep A IgM: NONREACTIVE
Hep B C IgM: NONREACTIVE
Hepatitis B Surface Ag: NEGATIVE

## 2012-12-20 LAB — MAGNESIUM: Magnesium: 1.6 mg/dL (ref 1.5–2.5)

## 2012-12-20 LAB — LIPID PANEL
HDL: 36 mg/dL — ABNORMAL LOW (ref >39)
LDL Cholesterol: 71 mg/dL (ref 0–99)
VLDL: 33 mg/dL (ref 0–40)

## 2012-12-20 LAB — HEMOGLOBIN A1C
Hgb A1c MFr Bld: 9.3 % — ABNORMAL HIGH (ref <5.7)
Mean Plasma Glucose: 220 mg/dL — ABNORMAL HIGH (ref <117)

## 2012-12-20 LAB — CBC WITH DIFFERENTIAL/PLATELET
Basophils Absolute: 0 10*3/uL (ref 0.0–0.1)
HCT: 42.9 % (ref 36.0–46.0)
Lymphocytes Relative: 32 % (ref 12–46)
Lymphs Abs: 3.1 10*3/uL (ref 0.7–4.0)
MCV: 79.9 fL (ref 78.0–100.0)
Monocytes Absolute: 0.5 10*3/uL (ref 0.1–1.0)
Neutro Abs: 5.7 10*3/uL (ref 1.7–7.7)
Neutrophils Relative %: 59 % (ref 43–77)
Platelets: 157 10*3/uL (ref 150–400)
RBC: 5.37 MIL/uL — ABNORMAL HIGH (ref 3.87–5.11)
RDW: 17.1 % — ABNORMAL HIGH (ref 11.5–15.5)
WBC: 9.8 10*3/uL (ref 4.0–10.5)

## 2012-12-20 LAB — LIPASE, BLOOD: Lipase: 49 U/L (ref 11–59)

## 2012-12-20 LAB — PRO B NATRIURETIC PEPTIDE: Pro B Natriuretic peptide (BNP): 381.2 pg/mL — ABNORMAL HIGH (ref 0–125)

## 2012-12-20 LAB — TROPONIN I
Troponin I: <0.3 ng/mL (ref <0.30)
Troponin I: <0.3 ng/mL (ref <0.30)

## 2012-12-20 MED ORDER — DEXTROSE 5 % IV SOLN
2.0000 g | INTRAVENOUS | Status: DC
  Administered 2012-12-20 – 2012-12-22 (×3): 2 g via INTRAVENOUS
  Filled 2012-12-20 (×3): qty 2

## 2012-12-20 MED ORDER — HYDROMORPHONE HCL PF 1 MG/ML IJ SOLN
1.0000 mg | INTRAMUSCULAR | Status: DC | PRN
  Administered 2012-12-20 – 2012-12-21 (×3): 1 mg via INTRAVENOUS
  Filled 2012-12-20 (×3): qty 1

## 2012-12-20 MED ORDER — INSULIN GLARGINE 100 UNIT/ML ~~LOC~~ SOLN
5.0000 [IU] | Freq: Every day | SUBCUTANEOUS | Status: DC
  Administered 2012-12-20: 5 [IU] via SUBCUTANEOUS
  Filled 2012-12-20 (×2): qty 0.05

## 2012-12-20 MED ORDER — NICOTINE 14 MG/24HR TD PT24
14.0000 mg | MEDICATED_PATCH | Freq: Every day | TRANSDERMAL | Status: DC
  Administered 2012-12-20 – 2012-12-22 (×3): 14 mg via TRANSDERMAL
  Filled 2012-12-20 (×3): qty 1

## 2012-12-20 MED ORDER — SODIUM CHLORIDE 0.9 % IV SOLN
Freq: Once | INTRAVENOUS | Status: AC
  Administered 2012-12-20: 13:00:00 via INTRAVENOUS

## 2012-12-20 MED ORDER — HYDROMORPHONE HCL PF 1 MG/ML IJ SOLN
1.0000 mg | Freq: Once | INTRAMUSCULAR | Status: AC
  Administered 2012-12-20: 1 mg via INTRAVENOUS

## 2012-12-20 NOTE — Telephone Encounter (Signed)
Spoke w/pt again on 10/22-advised Dr Loreta Ave agreed to review her records on 10/23. Will closely monitor. Prescribed zofran & hydrocodone. Advised no more than full liquids until seen. Do not restrict salt as mildly low.

## 2012-12-20 NOTE — Progress Notes (Signed)
Pt resting comfortable bed, pt is on 2L [place] PRN for comfort, VSS, Oxy IR 5 mg PO given for pain and Atarax for itchiness and zofran PO for nausea with relieve. Pt encourage to keep NPO after MN per MD order.

## 2012-12-20 NOTE — Telephone Encounter (Signed)
Patient's mom Marjory Lies would like to speak to you regarding patient's recent hospital visit.  Please call her at [phone].

## 2012-12-20 NOTE — Telephone Encounter (Signed)
Discussed Emily [name] recent urine cx result & Hx w/Dr [place]-hospitalist managing her care today. Sensitivity not back yet. eRCP is being considered. Emily [name] is stable, waiting to see Dr Dulce Sellar.

## 2012-12-20 NOTE — Progress Notes (Signed)
While pt was down in MRI, pts respirations were 6-8 per min and needed stimulation and prompt to breath, pt now back on unit and respirtations now 12-14 O2 88% on room air and on 2L 98%, BP 96/51 which is pts baseline, pt stable now and will continue to monitor Dr. Gwenlyn Perking notified
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
## 2012-12-20 NOTE — Progress Notes (Addendum)
[facility] PROGRESS NOTE  Emily [name]:[id number] DOB: [birth year] DOA: 12/19/2012 PCP: Kelle Darting, NP  Assessment/Plan: 1-abdominal pain, nausea/vomiting: cholelithiasis and possible choledocholithiasis.  -Symptoms had improved with supportive care and as needed antiemetics/analgesics -MRCP will be done to evaluate CBD properly and r/o retained stones -Gen. surgery has been consulted for possible need of cholecystectomy -Patient might require high desk and to evaluate gallbladder function  2-Escherichia coli UTI: Patient urinary infection most likely also contributing to her abdominal discomfort for, nausea and vomiting. She is also febrile today (12/20/2012) -Will start patient on Rocephin -Follow urine cultures -As needed antipyretics  3-mild episode of pancreatitis: Patient had a history of pancreatitis in 2013 at that time it showed he was thought to be related to statins vs biliary in origin. -Given cholelithiasis/choledocholithiasis as evident mild elevation of lipase, pancreatitis may be related to biliary/gallstones etiology. -Continue supportive care. -Lipase within normal limits this morning (12/20/2012) -Diet will be advanced to clear liquids  4-history of chronic combined congestive heart failure (ejection fraction 20-25% by 2-D echo on 10/27/2012): Currently compensated. -Continue digitoxin, coreg, enalapril and spironolactone -actively followed by heart failure clinic -Will be cautious with fluid resuscitation, follow daily weight and close intake and output  5-hyperlipidemia: Statins has been placed on hold, Given concerns for relationship with pancreatitis.  6-tobacco abuse: Counseling has been provided. Will use nicotine patch.  7-Hypertension: Is soft but stable at her baseline. We'll continue current medication regimen.  8-history of breast cancer: Continue letrozole  9-diabetes: A1c 9.3; continue sliding scale insulin; will add low-dose Lantus  for better control of her baseline  10-GERD: Continue PPI  DVT:SCD's and early ambulation (patient not interested in heparin products)  Code Status: full Family Communication: no family at bedside Disposition Plan: Home with medical stable   Consultants:  Gastroenterology (Dr. Russella Dar)  General surgery  Procedures:  MRCP pending  See below for x-ray reports  Antibiotics: Rocephin 12/20/12  HPI/Subjective: Feeling better, febrile with max temperature of 101.3; reports improvement in her abdominal pain and to be very hungry.  Objective: Filed Vitals:   12/20/12 1036  BP: 98/57  Pulse:   Temp: 101.3 F (38.5 C)  Resp: 16    Intake/Output Summary (Last 24 hours) at 12/20/12 1316 Last data filed at 12/20/12 1144  Gross per 24 hour  Intake    230 ml  Output    600 ml  Net   -370 ml   Filed Weights   12/19/12 1128 12/19/12 1453 12/20/12 0614  Weight: 67.132 kg (148 lb) 65.7 kg (144 lb 13.5 oz) 65.726 kg (144 lb 14.4 oz)    Exam:   General:  No acute distress, febrile; reports improvement in her abdominal pain and no nausea.  Cardiovascular: S1 and S2, no rubs, no gallops; no JVD  Respiratory: Clear to auscultation bilaterally  Abdomen: Soft, no guarding, no distention, mild discomfort with deep palpation involving right upper quadrant and epigastric area  Musculoskeletal: No swelling, no cyanosis, no clubbing  Data Reviewed: Basic Metabolic Panel:  Recent Labs Lab 12/17/12 1714 12/19/12 1147 12/20/12 0550  NA 132* 128* 137  K 4.1 3.8 3.9  CL 93* 88* 96  CO2 27 27 32  GLUCOSE 255* 286* 161*  BUN 13 10 11   CREATININE 0.76 0.62 0.73  CALCIUM 10.0 9.6 9.3  MG  --   --  1.6  PHOS 5.0*  --   --    Liver Function Tests:  Recent Labs Lab 12/17/12 1714 12/19/12  1147 12/20/12 0550  AST 20 18 17   ALT 19 16 16   ALKPHOS 57 55 52  BILITOT 0.7 0.6 0.4  PROT 8.0 8.0 7.5  ALBUMIN 4.6  4.6 4.1 3.6    Recent Labs Lab 12/17/12 1714  12/19/12 1147 12/20/12 0550  LIPASE 73 73* 49  AMYLASE 33  --   --    CBC:  Recent Labs Lab 12/17/12 1714 12/19/12 1147 12/20/12 0550  WBC 9.8 10.8* 9.8  NEUTROABS 5.6 7.6 5.7  HGB 15.6* 14.8 14.3  HCT 45.1 44.3 42.9  MCV 75.4* 77.7* 79.9  PLT 197 161 157   Cardiac Enzymes:  Recent Labs Lab 12/20/12 0036 12/20/12 0550  TROPONINI <0.30 <0.30   BNP (last 3 results)  Recent Labs  10/26/12 1700 12/19/12 1147 12/20/12 0036  PROBNP 3089.0* 452.4* 381.2*   CBG:  Recent Labs Lab 12/19/12 1933 12/20/12 0011 12/20/12 0411 12/20/12 0750 12/20/12 1208  GLUCAP 215* 164* 138* 165* 160*    Recent Results (from the past 240 hour(s))  URINE CULTURE     Status: None   Collection Time    12/18/12  4:30 PM      Result Value Range Status   Colony Count >=100,000 COLONIES/ML   Preliminary   Preliminary Report ESCHERICHIA COLI   Preliminary     Studies: Dg Chest 2 View  12/19/2012   CLINICAL DATA:  Upper abdominal pain, congestive heart failure  EXAM: CHEST  2 VIEW  COMPARISON:  Radiograph 10/29/2012  FINDINGS: Normal mediastinum and cardiac silhouette. Normal pulmonary vasculature. No evidence of effusion, infiltrate, or pneumothorax. No acute bony abnormality.  IMPRESSION: No acute cardiopulmonary process.   Electronically Signed   By: Genevive Bi M.D.   On: 12/19/2012 12:40    Scheduled Meds: . sodium chloride   Intravenous Once  . aspirin EC  81 mg Oral Daily  . calcium-vitamin D  1 tablet Oral BID  . carvedilol  3.125 mg Oral QAC breakfast  . carvedilol  6.25 mg Oral Q supper  . cefTRIAXone (ROCEPHIN)  IV  2 g Intravenous Q24H  . digoxin  0.125 mg Oral Daily  . docusate sodium  100 mg Oral BID  . enalapril  5 mg Oral BID  . ferrous sulfate  325 mg Oral QHS  . furosemide  40 mg Oral Daily  . gabapentin  800 mg Oral QHS  . hydrOXYzine  25 mg Oral QHS  . insulin aspart  0-15 Units Subcutaneous Q4H  . letrozole  2.5 mg Oral q morning - 10a  . LORazepam   2 mg Oral QHS  . magnesium oxide  400 mg Oral Daily  . nicotine  14 mg Transdermal Daily  . pantoprazole  40 mg Oral Daily  . potassium chloride SA  40 mEq Oral Daily  . spironolactone  25 mg Oral Daily   Continuous Infusions:   Principal Problem:   Choledocholithiasis Active Problems:   ADENOCARCINOMA, BREAST   History of acute pancreatitis   DM (diabetes mellitus), type 2, uncontrolled   Former smoker   Chronic systolic CHF (congestive heart failure)   Fatty liver disease, nonalcoholic   History of hypertension   Hyponatremia    Time spent: >30 minutes   Mynor Witkop  [facility] Pager [phone]. If 7PM-7AM, please contact night-coverage at [email or web address], password [facility] 12/20/2012, 1:16 PM  LOS: 1 day

## 2012-12-21 DIAGNOSIS — R933 Abnormal findings on diagnostic imaging of other parts of digestive tract: Secondary | ICD-10-CM

## 2012-12-21 DIAGNOSIS — I5022 Chronic systolic (congestive) heart failure: Secondary | ICD-10-CM

## 2012-12-21 DIAGNOSIS — R109 Unspecified abdominal pain: Secondary | ICD-10-CM

## 2012-12-21 DIAGNOSIS — I509 Heart failure, unspecified: Secondary | ICD-10-CM

## 2012-12-21 LAB — GLUCOSE, CAPILLARY
Glucose-Capillary: 124 mg/dL — ABNORMAL HIGH (ref 70–99)
Glucose-Capillary: 146 mg/dL — ABNORMAL HIGH (ref 70–99)
Glucose-Capillary: 140 mg/dL — ABNORMAL HIGH (ref 70–99)
Glucose-Capillary: 131 mg/dL — ABNORMAL HIGH (ref 70–99)

## 2012-12-21 LAB — URINE CULTURE: Colony Count: >=100000

## 2012-12-21 LAB — COMPREHENSIVE METABOLIC PANEL
BUN: 9 mg/dL (ref 6–23)
CO2: 29 mEq/L (ref 19–32)
Calcium: 9.3 mg/dL (ref 8.4–10.5)
Creatinine, Ser: 0.75 mg/dL (ref 0.50–1.10)
GFR calc Af Amer: >90 mL/min (ref >90)
GFR calc non Af Amer: >90 mL/min (ref >90)
Glucose, Bld: 132 mg/dL — ABNORMAL HIGH (ref 70–99)
Sodium: 134 mEq/L — ABNORMAL LOW (ref 135–145)
Total Bilirubin: 0.4 mg/dL (ref 0.3–1.2)
Total Protein: 7.1 g/dL (ref 6.0–8.3)

## 2012-12-21 LAB — CBC WITH DIFFERENTIAL/PLATELET
Eosinophils Absolute: 0.3 10*3/uL (ref 0.0–0.7)
Hemoglobin: 13.5 g/dL (ref 12.0–15.0)
Lymphocytes Relative: 35 % (ref 12–46)
Lymphs Abs: 3.5 10*3/uL (ref 0.7–4.0)
MCH: 26.7 pg (ref 26.0–34.0)
Monocytes Relative: 6 % (ref 3–12)
Neutro Abs: 5.6 10*3/uL (ref 1.7–7.7)
Neutrophils Relative %: 56 % (ref 43–77)
Platelets: 157 10*3/uL (ref 150–400)
RBC: 5.06 MIL/uL (ref 3.87–5.11)
RDW: 16.9 % — ABNORMAL HIGH (ref 11.5–15.5)
WBC: 9.9 10*3/uL (ref 4.0–10.5)

## 2012-12-21 LAB — MAGNESIUM: Magnesium: 1.5 mg/dL (ref 1.5–2.5)

## 2012-12-21 MED ORDER — INSULIN ASPART 100 UNIT/ML ~~LOC~~ SOLN
0.0000 [IU] | Freq: Three times a day (TID) | SUBCUTANEOUS | Status: DC
  Administered 2012-12-22: 3 [IU] via SUBCUTANEOUS
  Administered 2012-12-22: 11 [IU] via SUBCUTANEOUS

## 2012-12-21 MED ORDER — INSULIN GLARGINE 100 UNIT/ML ~~LOC~~ SOLN
10.0000 [IU] | Freq: Every day | SUBCUTANEOUS | Status: DC
  Administered 2012-12-21: 10 [IU] via SUBCUTANEOUS
  Filled 2012-12-21 (×2): qty 0.1

## 2012-12-21 NOTE — Consult Note (Signed)
I have seen and evaluated patient, she does have ruq pain and history of pancreatitis thought to be from statin.  I don't think she really had pancreatitis this admission.  She does not have stones by mr or [place].  She does have adenomyomatosis which can be related to stones.  That by itself is not indication for cholecystectomy. There is not really a proven association with gb cancer either.  I am however still not entirely sure this is not gb and would recommend hida.  I do not think she has cholecystitis however.  I think fever from e coli uti.  I think if she eats can get hida scan as outpt and see me in my office.

## 2012-12-21 NOTE — Progress Notes (Signed)
Pt running fever 101.8, Tylenol given as ordered, we'll continue to monitor.
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
## 2012-12-21 NOTE — Progress Notes (Signed)
[facility] Progress Note   Subjective  Starving, no abdominal pain but does have back discomfort   Objective   Vital signs in last 24 hours: Temp:  [98.1 F (36.7 C)-101.8 F (38.8 C)] 98.1 F (36.7 C) (10/25 0544) Pulse Rate:  [71-107] 82 (10/25 0925) Resp:  [14-18] 18 (10/25 0430) BP: (92-111)/(50-61) 92/52 mmHg (10/25 0925) SpO2:  [92 %-98 %] 92 % (10/25 0430) Weight:  [144 lb 12.8 oz (65.681 kg)] 144 lb 12.8 oz (65.681 kg) (10/25 0430) Last BM Date: 12/18/12 General:    white female in NAD Abdomen:  Soft, nontender and nondistended. Normal bowel sounds. Extremities:  Without edema. Neurologic:  Alert and oriented,  grossly normal neurologically. Psych:  Cooperative. Normal mood and affect. Lab Results:  Recent Labs  12/19/12 1147 12/20/12 0550 12/21/12 0515  WBC 10.8* 9.8 9.9  HGB 14.8 14.3 13.5  HCT 44.3 42.9 40.1  PLT 161 157 157   BMET  Recent Labs  12/19/12 1147 12/20/12 0550 12/21/12 0515  NA 128* 137 134*  K 3.8 3.9 3.7  CL 88* 96 93*  CO2 27 32 29  GLUCOSE 286* 161* 132*  BUN 10 11 9   CREATININE 0.62 0.73 0.75  CALCIUM 9.6 9.3 9.3   LFT  Recent Labs  12/21/12 0515  PROT 7.1  ALBUMIN 3.5  AST 17  ALT 13  ALKPHOS 44  BILITOT 0.4    Studies/Results: Mr Abdomen Mrcp Wo Cm  12/20/2012   CLINICAL DATA:  Dilated common duct with possible choledocholithiasis on ultrasound. Abdominal/back pain.  EXAM: MRI ABDOMEN WITHOUT  (INCLUDING MRCP)  TECHNIQUE: Multiplanar multisequence MR imaging of the abdomen was performed. Heavily T2-weighted images of the biliary and pancreatic ducts were obtained, and three-dimensional MRCP images were rendered by post processing.  COMPARISON:  Ultrasound abdomen dated 12/17/2012. CT abdomen pelvis dated 12/29/2011.  FINDINGS: Liver is within normal limits. No hepatic steatosis.  Spleen, pancreas, and adrenal glands are within normal limits.  Gallbladder is notable for suspected fundal adenomyomatosis  (series 7/ image 27).  No intrahepatic or extrahepatic ductal dilatation. Common duct measures 4 mm, within normal limits. No choledocholithiasis is seen.  Small bilateral renal cysts measuring up to 9 mm. No hydronephrosis.  No abdominal ascites.  No suspicious abdominal lymphadenopathy.  No focal osseous lesions.  IMPRESSION: Common duct measures 4 mm, within normal limits. No choledocholithiasis is seen.  Suspected gallbladder adenomyomatosis.   Electronically Signed   By: Charline Bills M.D.   On: 12/20/2012 16:57   Mr 3d Recon At Scanner  12/20/2012   CLINICAL DATA:  Dilated common duct with possible choledocholithiasis on ultrasound. Abdominal/back pain.  EXAM: MRI ABDOMEN WITHOUT  (INCLUDING MRCP)  TECHNIQUE: Multiplanar multisequence MR imaging of the abdomen was performed. Heavily T2-weighted images of the biliary and pancreatic ducts were obtained, and three-dimensional MRCP images were rendered by post processing.  COMPARISON:  Ultrasound abdomen dated 12/17/2012. CT abdomen pelvis dated 12/29/2011.  FINDINGS: Liver is within normal limits. No hepatic steatosis.  Spleen, pancreas, and adrenal glands are within normal limits.  Gallbladder is notable for suspected fundal adenomyomatosis (series 7/ image 27).  No intrahepatic or extrahepatic ductal dilatation. Common duct measures 4 mm, within normal limits. No choledocholithiasis is seen.  Small bilateral renal cysts measuring up to 9 mm. No hydronephrosis.  No abdominal ascites.  No suspicious abdominal lymphadenopathy.  No focal osseous lesions.  IMPRESSION: Common duct measures 4 mm, within normal limits. No choledocholithiasis is seen.  Suspected gallbladder adenomyomatosis.  Electronically Signed   By: Charline Bills M.D.   On: 12/20/2012 16:57     Assessment / Plan:    1. Abdominal pain, mildly elevated lipase. Patient has a history of pancreatitis  (? etiology) though no evidence of pancreatitis on current imaging studies. Though  ultrasound raised suspicion of choledocholithiasis, MRCP negative for such and LFTs are normal. No abdominal pain at present. She is hungry, diet to be advanced.   2. Suspected gallbladder adenomyomatosis. Surgery to see today  3. E coli UTI. Rocephin began today.   4.  Hx microcytic anemia. Had EGD and colonoscopy in 2010 in [place] with findings of gastritis and  hemorrhoids.     LOS: 2 days   Willette Cluster  12/21/2012, 10:06 AM     Attending physician's note   I have taken an interval history, reviewed the chart and examined the patient. I agree with the Advanced Practitioner's note, impression and recommendations. MRCP showed a normal CBD and suspected gallbladder adenomyomatosis. Gen surgery consult in progress.  Venita Lick. Russella Dar, MD [facility]

## 2012-12-21 NOTE — Progress Notes (Signed)
[facility] PROGRESS NOTE  Emily [name] ZOX:[id number] DOB: 13-Feb-1961 DOA: 12/19/2012 PCP: Kelle Darting, NP  Assessment/Plan: 1-abdominal pain, nausea/vomiting: cholelithiasis and possible choledocholithiasis.  -Symptoms had improved with supportive care and as needed antiemetics/analgesics -Patient currently is starving and will like to have her diet advanced -Appreciate GI and general surgery evaluation and recommendations -MRCP has rule out gallstones; but demonstrated adenomyomatosis -Patient might require Hida scan to evaluate gallbladder function and determine needs of cholecystectomy; if patient able to tolerate diet this workup can be done in the outpatient setting. -Will advance diet  2-Escherichia coli UTI: Patient urinary infection most likely also contributing to her abdominal discomfort, fever, nausea and vomiting.  -Patient is still spiking fever ; reson why IV antibiotics will be continue -Per culture results Escherichia coli is pansensitive -As needed antipyretics -Will like at least 24 hours without fever before switching treatment to oral antibiotics.  3-mild episode of pancreatitis: Patient had a history of pancreatitis in 2013 at that time it showed he was thought to be related to statins vs biliary in origin vs acute phase reactant lipase and no pancreatitis at all. -Continue supportive care. -Lipase within normal limits now -Diet will be advanced  -patient abd pain could be explained by UTI  4-history of chronic combined congestive heart failure (ejection fraction 20-25% by 2-D echo on 10/27/2012): -Currently remains compensated. -Continue digitoxin, coreg, enalapril and spironolactone -actively followed by heart failure clinic -Will be cautious with fluid resuscitation, follow daily weight and close intake and output  5-hyperlipidemia: Statins has been placed on hold, Given concerns for relationship with pancreatitis. -will resume at  discharge  6-Tobacco abuse: Counseling has been provided.  -Will continue using nicotine patch.  7-Hypertension: Is soft but stable and at her baseline. Will continue current medication regimen.  8-history of breast cancer: Continue letrozole  9-Diabetes: A1c 9.3; continue sliding scale insulin; will add low-dose Lantus for better control of her baseline CBG and will adjust medication as needed.  10-GERD: Continue PPI  DVT: SCD's and early ambulation (patient not interested in heparin products)  Code Status: full Family Communication: no family at bedside Disposition Plan: Home with medical stable   Consultants:  Gastroenterology (Dr. Russella Dar)  General surgery  Procedures:  MRCP pending  See below for x-ray reports  Antibiotics: Rocephin 12/20/12  HPI/Subjective: Feeling better, no nausea, no vomiting and will like to eat. Still spiking fever.  Objective: Filed Vitals:   12/21/12 1300  BP: 98/64  Pulse: 98  Temp: 97.4 F (36.3 C)  Resp: 20    Intake/Output Summary (Last 24 hours) at 12/21/12 1656 Last data filed at 12/20/12 2107  Gross per 24 hour  Intake   1158 ml  Output    200 ml  Net    958 ml   Filed Weights   12/19/12 1453 12/20/12 0614 12/21/12 0430  Weight: 65.7 kg (144 lb 13.5 oz) 65.726 kg (144 lb 14.4 oz) 65.681 kg (144 lb 12.8 oz)    Exam:   General:  No acute distress, febrile; reports improvement in her abdominal pain and no nausea.  Cardiovascular: S1 and S2, no rubs, no gallops; no JVD  Respiratory: Clear to auscultation bilaterally  Abdomen: Soft, no guarding, no distention, still with mild discomfort with deep palpation involving right upper quadrant and epigastric area  Musculoskeletal: No swelling, no cyanosis, no clubbing  Data Reviewed: Basic Metabolic Panel:  Recent Labs Lab 12/17/12 1714 12/19/12 1147 12/20/12 0550 12/21/12 0515  NA 132* 128* 137  134*  K 4.1 3.8 3.9 3.7  CL 93* 88* 96 93*  CO2 27 27 32 29   GLUCOSE 255* 286* 161* 132*  BUN 13 10 11 9   CREATININE 0.76 0.62 0.73 0.75  CALCIUM 10.0 9.6 9.3 9.3  MG  --   --  1.6 1.5  PHOS 5.0*  --   --   --    Liver Function Tests:  Recent Labs Lab 12/17/12 1714 12/19/12 1147 12/20/12 0550 12/21/12 0515  AST 20 18 17 17   ALT 19 16 16 13   ALKPHOS 57 55 52 44  BILITOT 0.7 0.6 0.4 0.4  PROT 8.0 8.0 7.5 7.1  ALBUMIN 4.6  4.6 4.1 3.6 3.5    Recent Labs Lab 12/17/12 1714 12/19/12 1147 12/20/12 0550  LIPASE 73 73* 49  AMYLASE 33  --   --    CBC:  Recent Labs Lab 12/17/12 1714 12/19/12 1147 12/20/12 0550 12/21/12 0515  WBC 9.8 10.8* 9.8 9.9  NEUTROABS 5.6 7.6 5.7 5.6  HGB 15.6* 14.8 14.3 13.5  HCT 45.1 44.3 42.9 40.1  MCV 75.4* 77.7* 79.9 79.2  PLT 197 161 157 157   Cardiac Enzymes:  Recent Labs Lab 12/20/12 0036 12/20/12 0550  TROPONINI <0.30 <0.30   BNP (last 3 results)  Recent Labs  10/26/12 1700 12/19/12 1147 12/20/12 0036  PROBNP 3089.0* 452.4* 381.2*   CBG:  Recent Labs Lab 12/20/12 2001 12/21/12 0014 12/21/12 0315 12/21/12 0424 12/21/12 0809  GLUCAP 149* 124* 146* 130* 140*    Recent Results (from the past 240 hour(s))  URINE CULTURE     Status: None   Collection Time    12/18/12  4:30 PM      Result Value Range Status   Culture ESCHERICHIA COLI   Final   Colony Count >=100,000 COLONIES/ML   Final   Organism ID, Bacteria ESCHERICHIA COLI   Final     Studies: Mr Abdomen Mrcp Wo Cm  12/20/2012   CLINICAL DATA:  Dilated common duct with possible choledocholithiasis on ultrasound. Abdominal/back pain.  EXAM: MRI ABDOMEN WITHOUT  (INCLUDING MRCP)  TECHNIQUE: Multiplanar multisequence MR imaging of the abdomen was performed. Heavily T2-weighted images of the biliary and pancreatic ducts were obtained, and three-dimensional MRCP images were rendered by post processing.  COMPARISON:  Ultrasound abdomen dated 12/17/2012. CT abdomen pelvis dated 12/29/2011.  FINDINGS: Liver is within normal  limits. No hepatic steatosis.  Spleen, pancreas, and adrenal glands are within normal limits.  Gallbladder is notable for suspected fundal adenomyomatosis (series 7/ image 27).  No intrahepatic or extrahepatic ductal dilatation. Common duct measures 4 mm, within normal limits. No choledocholithiasis is seen.  Small bilateral renal cysts measuring up to 9 mm. No hydronephrosis.  No abdominal ascites.  No suspicious abdominal lymphadenopathy.  No focal osseous lesions.  IMPRESSION: Common duct measures 4 mm, within normal limits. No choledocholithiasis is seen.  Suspected gallbladder adenomyomatosis.   Electronically Signed   By: Charline Bills M.D.   On: 12/20/2012 16:57   Mr 3d Recon At Scanner  12/20/2012   CLINICAL DATA:  Dilated common duct with possible choledocholithiasis on ultrasound. Abdominal/back pain.  EXAM: MRI ABDOMEN WITHOUT  (INCLUDING MRCP)  TECHNIQUE: Multiplanar multisequence MR imaging of the abdomen was performed. Heavily T2-weighted images of the biliary and pancreatic ducts were obtained, and three-dimensional MRCP images were rendered by post processing.  COMPARISON:  Ultrasound abdomen dated 12/17/2012. CT abdomen pelvis dated 12/29/2011.  FINDINGS: Liver is within normal limits. No hepatic steatosis.  Spleen, pancreas, and adrenal glands are within normal limits.  Gallbladder is notable for suspected fundal adenomyomatosis (series 7/ image 27).  No intrahepatic or extrahepatic ductal dilatation. Common duct measures 4 mm, within normal limits. No choledocholithiasis is seen.  Small bilateral renal cysts measuring up to 9 mm. No hydronephrosis.  No abdominal ascites.  No suspicious abdominal lymphadenopathy.  No focal osseous lesions.  IMPRESSION: Common duct measures 4 mm, within normal limits. No choledocholithiasis is seen.  Suspected gallbladder adenomyomatosis.   Electronically Signed   By: Charline Bills M.D.   On: 12/20/2012 16:57    Scheduled Meds: . aspirin EC  81 mg  Oral Daily  . calcium-vitamin D  1 tablet Oral BID  . carvedilol  3.125 mg Oral QAC breakfast  . carvedilol  6.25 mg Oral Q supper  . cefTRIAXone (ROCEPHIN)  IV  2 g Intravenous Q24H  . digoxin  0.125 mg Oral Daily  . docusate sodium  100 mg Oral BID  . enalapril  5 mg Oral BID  . ferrous sulfate  325 mg Oral QHS  . furosemide  40 mg Oral Daily  . gabapentin  800 mg Oral QHS  . hydrOXYzine  25 mg Oral QHS  . insulin aspart  0-15 Units Subcutaneous Q4H  . insulin glargine  5 Units Subcutaneous QHS  . letrozole  2.5 mg Oral q morning - 10a  . LORazepam  2 mg Oral QHS  . magnesium oxide  400 mg Oral Daily  . nicotine  14 mg Transdermal Daily  . pantoprazole  40 mg Oral Daily  . potassium chloride SA  40 mEq Oral Daily  . spironolactone  25 mg Oral Daily   Continuous Infusions:   Principal Problem:   Choledocholithiasis Active Problems:   ADENOCARCINOMA, BREAST   History of acute pancreatitis   DM (diabetes mellitus), type 2, uncontrolled   Former smoker   Chronic systolic CHF (congestive heart failure)   Fatty liver disease, nonalcoholic   History of hypertension   Hyponatremia   Nonspecific (abnormal) findings on radiological and other examination of biliary tract    Time spent: >30 minutes   Jeremiah Curci  [facility] Pager [phone]. If 7PM-7AM, please contact night-coverage at [email or web address], password [facility] 12/21/2012, 4:56 PM  LOS: 2 days

## 2012-12-22 DIAGNOSIS — F329 Major depressive disorder, single episode, unspecified: Secondary | ICD-10-CM

## 2012-12-22 DIAGNOSIS — E785 Hyperlipidemia, unspecified: Secondary | ICD-10-CM

## 2012-12-22 DIAGNOSIS — F3289 Other specified depressive episodes: Secondary | ICD-10-CM

## 2012-12-22 LAB — MAGNESIUM: Magnesium: 1.8 mg/dL (ref 1.5–2.5)

## 2012-12-22 LAB — CBC WITH DIFFERENTIAL/PLATELET
Basophils Absolute: 0 10*3/uL (ref 0.0–0.1)
Basophils Relative: 0 % (ref 0–1)
Hemoglobin: 14.1 g/dL (ref 12.0–15.0)
MCH: 27.2 pg (ref 26.0–34.0)
MCHC: 34.6 g/dL (ref 30.0–36.0)
Monocytes Relative: 6 % (ref 3–12)
Neutro Abs: 4.9 10*3/uL (ref 1.7–7.7)
Neutrophils Relative %: 59 % (ref 43–77)
Platelets: 133 10*3/uL — ABNORMAL LOW (ref 150–400)
RDW: 16.7 % — ABNORMAL HIGH (ref 11.5–15.5)
WBC: 8.4 10*3/uL (ref 4.0–10.5)

## 2012-12-22 LAB — COMPREHENSIVE METABOLIC PANEL
ALT: 15 U/L (ref 0–35)
Alkaline Phosphatase: 44 U/L (ref 39–117)
BUN: 12 mg/dL (ref 6–23)
Chloride: 95 mEq/L — ABNORMAL LOW (ref 96–112)
Creatinine, Ser: 0.67 mg/dL (ref 0.50–1.10)
GFR calc Af Amer: >90 mL/min (ref >90)
GFR calc non Af Amer: >90 mL/min (ref >90)
Glucose, Bld: 164 mg/dL — ABNORMAL HIGH (ref 70–99)
Potassium: 3.7 mEq/L (ref 3.5–5.1)
Total Bilirubin: 0.4 mg/dL (ref 0.3–1.2)
Total Protein: 7.2 g/dL (ref 6.0–8.3)

## 2012-12-22 LAB — GLUCOSE, CAPILLARY
Glucose-Capillary: 162 mg/dL — ABNORMAL HIGH (ref 70–99)
Glucose-Capillary: 153 mg/dL — ABNORMAL HIGH (ref 70–99)

## 2012-12-22 MED ORDER — HYDROCODONE-ACETAMINOPHEN 5-325 MG PO TABS: 1.0000 | ORAL_TABLET | Freq: Four times a day (QID) | ORAL | Status: DC | PRN

## 2012-12-22 MED ORDER — ATORVASTATIN CALCIUM 20 MG PO TABS: 20.0000 mg | ORAL_TABLET | Freq: Every day | ORAL | Status: DC

## 2012-12-22 MED ORDER — OMEPRAZOLE 40 MG PO CPDR: 40.0000 mg | DELAYED_RELEASE_CAPSULE | Freq: Every morning | ORAL | Status: DC

## 2012-12-22 MED ORDER — LEVOFLOXACIN 500 MG PO TABS: 500.0000 mg | ORAL_TABLET | Freq: Every day | ORAL | Status: DC

## 2012-12-22 NOTE — Progress Notes (Signed)
Patient ID: Emily [name], female   DOB: Jan 26, 1961, 52 y.o.   MRN: [id number]  [facility] Progress Note  Subjective: Feels pretty good, eating without difficulty - still some back pain, going home this afternoon  Objective:  Vital signs in last 24 hours: Temp:  [97.4 F (36.3 C)-97.6 F (36.4 C)] 97.4 F (36.3 C) (10/26 0553) Pulse Rate:  [89-98] 91 (10/26 0900) Resp:  [18-20] 20 (10/26 0900) BP: (88-98)/(51-66) 91/61 mmHg (10/26 0900) SpO2:  [92 %-95 %] 92 % (10/26 0900) Weight:  [143 lb 4.8 oz (65 kg)] 143 lb 4.8 oz (65 kg) (10/26 0553) Last BM Date: 12/18/12 General:   Alert,  Well-developed,  Thin WF  in NAD Heart:  Regular rate and rhythm; no murmurs Pulm;clear Abdomen:  Soft, nontender and nondistended. Normal bowel sounds, without guarding, and without rebound.   Extremities:  Without edema. Neurologic:  Alert and  oriented x4;  grossly normal neurologically. Psych:  Alert and cooperative. Normal mood and affect.  Intake/Output from previous day: 10/25 0701 - 10/26 0700 In: 558 [P.O.:558] Out: 1600 [Urine:1600] Intake/Output this shift: Total I/O In: 360 [P.O.:360] Out: 400 [Urine:400]  Lab Results:  Recent Labs  12/20/12 0550 12/21/12 0515 12/22/12 0600  WBC 9.8 9.9 8.4  HGB 14.3 13.5 14.1  HCT 42.9 40.1 40.8  PLT 157 157 133*   BMET  Recent Labs  12/20/12 0550 12/21/12 0515 12/22/12 0600  NA 137 134* 136  K 3.9 3.7 3.7  CL 96 93* 95*  CO2 32 29 28  GLUCOSE 161* 132* 164*  BUN 11 9 12   CREATININE 0.73 0.75 0.67  CALCIUM 9.3 9.3 9.5   LFT  Recent Labs  12/22/12 0600  PROT 7.2  ALBUMIN 3.5  AST 21  ALT 15  ALKPHOS 44  BILITOT 0.4   PT/INR No results found for this basename: LABPROT, INR,  in the last 72 hours Hepatitis Panel  Recent Labs  12/19/12 1709  HEPBSAG NEGATIVE  HCVAB NEGATIVE  HEPAIGM NON REACTIVE  HEPBIGM NON REACTIVE    Assessment / Plan: #1 52 yo female with hx of recurrent pancreatitis admitted  with acute abdominal pain, but no pancreatitis on imaging this admit She has GB adenomyomatosis  Pain has improved. She will see Dr. Dwain Sarna as outpt and have HIDA done, then probable cholecystectomy.GI available - had seen Dr. Arlyce Dice. #2 UTI #3 CHF #4 CAD   Principal Problem:   UTI (urinary tract infection) Active Problems:   ADENOCARCINOMA, BREAST   History of acute pancreatitis   DM (diabetes mellitus), type 2, uncontrolled   Former smoker   Chronic systolic CHF (congestive heart failure)   Fatty liver disease, nonalcoholic   History of hypertension   Hyponatremia   Choledocholithiasis   Nonspecific (abnormal) findings on radiological and other examination of biliary tract    LOS: 3 days   Amy Esterwood  12/22/2012, 12:29 PM     Attending physician's note   I have taken an interval history, reviewed the chart and examined the patient. I agree with the Advanced Practitioner's note, impression and recommendations. Further OP evaluation by Dr. Dwain Sarna, likely for cholecystectomy. GI follow up as needed with Dr. Darrel Hoover.  Venita Lick. Russella Dar, MD [facility]

## 2012-12-22 NOTE — Progress Notes (Signed)
Subjective: She is going home today, feels better, abdomen feels better.  Objective: Vital signs in last 24 hours: Temp:  [97.4 F (36.3 C)-97.6 F (36.4 C)] 97.4 F (36.3 C) (10/26 0553) Pulse Rate:  [89-98] 91 (10/26 0900) Resp:  [18-20] 20 (10/26 0900) BP: (88-98)/(51-66) 91/61 mmHg (10/26 0900) SpO2:  [92 %-95 %] 92 % (10/26 0900) Weight:  [65 kg (143 lb 4.8 oz)] 65 kg (143 lb 4.8 oz) (10/26 0553) Last BM Date: 12/18/12 Carb modified diet BP down last PM 88/5, 91/61 this Am. No fever since yesterday AM 0700.   Intake/Output from previous day: 10/25 0701 - 10/26 0700 In: 558 [P.O.:558] Out: 1600 [Urine:1600] Intake/Output this shift: Total I/O In: 360 [P.O.:360] Out: 400 [Urine:400]  General appearance: alert, cooperative and no distress GI: soft, non-tender; bowel sounds normal; no masses,  no organomegaly  Lab Results:   Recent Labs  12/21/12 0515 12/22/12 0600  WBC 9.9 8.4  HGB 13.5 14.1  HCT 40.1 40.8  PLT 157 133*    BMET  Recent Labs  12/21/12 0515 12/22/12 0600  NA 134* 136  K 3.7 3.7  CL 93* 95*  CO2 29 28  GLUCOSE 132* 164*  BUN 9 12  CREATININE 0.75 0.67  CALCIUM 9.3 9.5   PT/INR No results found for this basename: LABPROT, INR,  in the last 72 hours   Recent Labs Lab 12/17/12 1714 12/19/12 1147 12/20/12 0550 12/21/12 0515 12/22/12 0600  AST 20 18 17 17 21   ALT 19 16 16 13 15   ALKPHOS 57 55 52 44 44  BILITOT 0.7 0.6 0.4 0.4 0.4  PROT 8.0 8.0 7.5 7.1 7.2  ALBUMIN 4.6  4.6 4.1 3.6 3.5 3.5     Lipase     Component Value Date/Time   LIPASE 49 12/20/2012 0550     Studies/Results: Mr Abdomen Mrcp Wo Cm  12/20/2012   CLINICAL DATA:  Dilated common duct with possible choledocholithiasis on ultrasound. Abdominal/back pain.  EXAM: MRI ABDOMEN WITHOUT  (INCLUDING MRCP)  TECHNIQUE: Multiplanar multisequence MR imaging of the abdomen was performed. Heavily T2-weighted images of the biliary and pancreatic ducts were obtained,  and three-dimensional MRCP images were rendered by post processing.  COMPARISON:  Ultrasound abdomen dated 12/17/2012. CT abdomen pelvis dated 12/29/2011.  FINDINGS: Liver is within normal limits. No hepatic steatosis.  Spleen, pancreas, and adrenal glands are within normal limits.  Gallbladder is notable for suspected fundal adenomyomatosis (series 7/ image 27).  No intrahepatic or extrahepatic ductal dilatation. Common duct measures 4 mm, within normal limits. No choledocholithiasis is seen.  Small bilateral renal cysts measuring up to 9 mm. No hydronephrosis.  No abdominal ascites.  No suspicious abdominal lymphadenopathy.  No focal osseous lesions.  IMPRESSION: Common duct measures 4 mm, within normal limits. No choledocholithiasis is seen.  Suspected gallbladder adenomyomatosis.   Electronically Signed   By: Charline Bills M.D.   On: 12/20/2012 16:57   Mr 3d Recon At Scanner  12/20/2012   CLINICAL DATA:  Dilated common duct with possible choledocholithiasis on ultrasound. Abdominal/back pain.  EXAM: MRI ABDOMEN WITHOUT  (INCLUDING MRCP)  TECHNIQUE: Multiplanar multisequence MR imaging of the abdomen was performed. Heavily T2-weighted images of the biliary and pancreatic ducts were obtained, and three-dimensional MRCP images were rendered by post processing.  COMPARISON:  Ultrasound abdomen dated 12/17/2012. CT abdomen pelvis dated 12/29/2011.  FINDINGS: Liver is within normal limits. No hepatic steatosis.  Spleen, pancreas, and adrenal glands are within normal limits.  Gallbladder is  notable for suspected fundal adenomyomatosis (series 7/ image 27).  No intrahepatic or extrahepatic ductal dilatation. Common duct measures 4 mm, within normal limits. No choledocholithiasis is seen.  Small bilateral renal cysts measuring up to 9 mm. No hydronephrosis.  No abdominal ascites.  No suspicious abdominal lymphadenopathy.  No focal osseous lesions.  IMPRESSION: Common duct measures 4 mm, within normal limits. No  choledocholithiasis is seen.  Suspected gallbladder adenomyomatosis.   Electronically Signed   By: Charline Bills M.D.   On: 12/20/2012 16:57    Medications: . aspirin EC  81 mg Oral Daily  . calcium-vitamin D  1 tablet Oral BID  . carvedilol  3.125 mg Oral QAC breakfast  . carvedilol  6.25 mg Oral Q supper  . cefTRIAXone (ROCEPHIN)  IV  2 g Intravenous Q24H  . digoxin  0.125 mg Oral Daily  . docusate sodium  100 mg Oral BID  . enalapril  5 mg Oral BID  . ferrous sulfate  325 mg Oral QHS  . furosemide  40 mg Oral Daily  . gabapentin  800 mg Oral QHS  . hydrOXYzine  25 mg Oral QHS  . insulin aspart  0-15 Units Subcutaneous TID WC  . insulin glargine  10 Units Subcutaneous QHS  . letrozole  2.5 mg Oral q morning - 10a  . LORazepam  2 mg Oral QHS  . magnesium oxide  400 mg Oral Daily  . nicotine  14 mg Transdermal Daily  . pantoprazole  40 mg Oral Daily  . potassium chloride SA  40 mEq Oral Daily  . spironolactone  25 mg Oral Daily    Assessment/Plan 1.Suspected gallbladder adenomyomatosis. Normal WBC, Normal LFT's 2. Left Breast cancer with lumpectomy and Chemotherapy  3. NICM EF 20-25%, Hospitalized 9/30-10/9/14 with CHF  4. AODM/DIABETIC NEUROPATHY (HBA1C 9.3) 5. E Coli UTI 6. Pancreatitis 12/2011,  7. Tobacco use/COPD  8. Hx of hepatitis E 9. Anemia  10. Arthritis  11. Depression   Plan:  Going home today will follow up with Dr Dwain Sarna as OP. Info in AVS.  LOS: 3 days    Emily [name] 12/22/2012

## 2012-12-22 NOTE — Discharge Summary (Signed)
Physician Discharge Summary  Emily [name] ZOX:[id number] DOB: 1960/06/29 DOA: 12/19/2012  PCP: Kelle Darting, NP  Admit date: 12/19/2012 Discharge date: 12/22/2012  Time spent: >30 minutes  Recommendations for Outpatient Follow-up:  1. Follow BMET for reassessment of electrolytes and kidney function 2. Will follow with general surgery for Hida Scan and final decision on cholecystectomy  3. Patient needs better control of her diabetes (A1C 9.3)  Discharge Diagnoses:  Principal Problem:   UTI (urinary tract infection) Active Problems:   ADENOCARCINOMA, BREAST   History of acute pancreatitis   DM (diabetes mellitus), type 2, uncontrolled   Former smoker   Chronic systolic CHF (congestive heart failure)   Fatty liver disease, nonalcoholic   History of hypertension   Hyponatremia   Choledocholithiasis   Nonspecific (abnormal) findings on radiological and other examination of biliary tract Gallbladder adenomyomatosis  Discharge Condition: stable and improved. Will discharge home. Will follow with PCP in 10 days and general surgery in outpatient setting for hida scan and final decision on needs of cholecystectomy.  Diet recommendation: heart healthy, low fat diet and low carb  Filed Weights   12/20/12 0614 12/21/12 0430 12/22/12 0553  Weight: 65.726 kg (144 lb 14.4 oz) 65.681 kg (144 lb 12.8 oz) 65 kg (143 lb 4.8 oz)    History of present illness:  52 y.o. WF PMHx Chronic Systolic CHF,Hx acute pancreatitis, diabetes type 2 uncontrolled, fatty liver disease nonalcoholic, HTN -- presents with 5 day h/o upper abdominal pain radiating to back (pain initially started in back), positive nausea/vomiting. Describes pain as dull but steady. States eating does not increase pain. She was seen by PCP and had US showing mild dilation of CBD, ? CBD stone. Lipase and LFTs normal. D/c home with vicodin which has not helped. Told to f/u with GI but they cannot see her for a month. Symptoms are  worsening.   Hospital Course:  1-abdominal pain, nausea/vomiting: cholelithiasis and possible choledocholithiasis.  -Symptoms had improved/resolved with supportive care and as needed antiemetics/analgesics  -Patient tolerating diet, no N/V -Appreciate GI and general surgery evaluation and recommendations  -MRCP has rule out gallstones; but demonstrated adenomyomatosis  -Patient will follow in outpatient setting with surgery for Hida scan to evaluate gallbladder function and determine needs of cholecystectomy -no nausea, no vomiting and tolerating diet at discharge  2-Escherichia coli UTI: Patient urinary infection most likely also contributing to her abdominal discomfort, fever, nausea and vomiting.  -Patient remains on IV antibiotics until afebrile for 24 hours. -now able to tolerate PO's and w/o fever will discharge home with 7 days therapy of levaquin. Will follow with PCP in 10 days   3-mild episode of pancreatitis: Patient had a history of pancreatitis in 2013 at that time was thought to be related to statins vs biliary in origin. This time given CBD concerns etiology involved biliary origin vs acute phase reactant lipase and no pancreatitis at all.  -Lipase within normal limits overnight -Diet advanced and tolerated -patient abd pain could be explained by UTI  -will follow in outpatient setting by surgery for hida scan and decision on cholecystectomy to be taken base on results.  4-history of chronic combined congestive heart failure (ejection fraction 20-25% by 2-D echo on 10/27/2012): -Currently remains compensated.  -Continue digitoxin, coreg, enalapril and spironolactone  -actively followed by heart failure clinic  -BNP 381 (compensated)  5-hyperlipidemia: Statins has been placed on hold, Given concerns for relationship with pancreatitis. Really low likelihood. -will resume lipitor 20mg  daily at discharge  and patient has been advised to follow a low fat diet.  6-Tobacco abuse:  Counseling has been provided.  -Will continue using nicotine patch.   7-Hypertension: Is soft but stable and at her baseline. Will continue current medication regimen.   8-history of breast cancer: Continue letrozole   9-Diabetes: A1c 9.3; continue home regimen and low carb diet. -if patient continue failing to control diabetes with oral regimen might need insulin therapy. -good control while in the hospital with lantus and SSI.  10-GERD: Continue PPI  Procedures: See below for x-ray reports  Consultations:  GI  General Surgery  Discharge Exam: Filed Vitals:   12/22/12 0900  BP: 91/61  Pulse: 91  Temp:   Resp: 20   General: No acute distress, afebrile; reports improvement in her abdominal pain, tolerating diet and no nausea/vomiting.  Cardiovascular: S1 and S2, no rubs, no gallops; no JVD  Respiratory: Clear to auscultation bilaterally  Abdomen: Soft, no guarding, no distention, still with mild discomfort with deep palpation involving right upper quadrant and epigastric area  Musculoskeletal: No swelling, no cyanosis, no clubbing   Discharge Instructions  Discharge Orders   Future Appointments Provider Department Dept Phone   12/25/2012 9:00 AM [facility] [id number]   Patient to arrive 15 minutes prior to appointment time.   01/02/2013 3:00 PM Vevelyn Royals, RD [facility] [phone]   01/08/2013 2:20 PM [facility] [phone]   02/04/2013 1:00 PM Kelle Darting, NP [facility] [place] [phone]   03/20/2013 1:00 PM [facility] [phone]   Future Orders Complete By Expires   Diet - low sodium heart healthy  As directed    Discharge instructions  As directed    Comments:     Take medications as prescribed Quit smoking Follow with PCP in 10 days Follow with surgery as  instructed Follow heart healthy diet and be compliant with your heart failure clinic visit as previously scheduled       Medication List    STOP taking these medications       ondansetron 4 MG tablet  Commonly known as:  ZOFRAN      TAKE these medications       aspirin EC 81 MG tablet  Take 81 mg by mouth daily.     atorvastatin 20 MG tablet  Commonly known as:  LIPITOR  Take 1 tablet (20 mg total) by mouth daily.     calcium-vitamin D 500-200 MG-UNIT per tablet  Commonly known as:  OSCAL WITH D  Take 1 tablet by mouth 2 (two) times daily.     carvedilol 3.125 MG tablet  Commonly known as:  COREG  Take 3.125 mg by mouth every morning.     carvedilol 6.25 MG tablet  Commonly known as:  COREG  Take 6.25 mg by mouth every evening.     digoxin 0.125 MG tablet  Commonly known as:  LANOXIN  Take 0.125 mg by mouth daily.     enalapril 10 MG tablet  Commonly known as:  VASOTEC  Take 5 mg by mouth 2 (two) times daily.     ferrous sulfate 325 (65 FE) MG tablet  Take 325 mg by mouth at bedtime.     furosemide 40 MG tablet  Commonly known as:  LASIX  Take 40 mg by mouth daily.     furosemide  40 MG tablet  Commonly known as:  LASIX  Take 40 mg by mouth daily as needed for fluid or edema.     gabapentin 400 MG tablet  Commonly known as:  NEURONTIN  Take 800 mg by mouth at bedtime.     HYDROcodone-acetaminophen 5-325 MG per tablet  Commonly known as:  NORCO/VICODIN  Take 1 tablet by mouth every 6 (six) hours as needed for pain.     hydroxypropyl methylcellulose 2.5 % ophthalmic solution  Commonly known as:  ISOPTO TEARS  Place 1 drop into both eyes 4 (four) times daily as needed (dry eyes).     hydrOXYzine 25 MG tablet  Commonly known as:  ATARAX/VISTARIL  Take 25 mg by mouth at bedtime.     letrozole 2.5 MG tablet  Commonly known as:  FEMARA  Take 2.5 mg by mouth every morning.     levofloxacin 500 MG tablet  Commonly known as:  LEVAQUIN  Take 1 tablet  (500 mg total) by mouth daily.     LORazepam 2 MG tablet  Commonly known as:  ATIVAN  Take 2 mg by mouth at bedtime.     magnesium oxide 400 MG tablet  Commonly known as:  MAG-OX  Take 400 mg by mouth daily.     metFORMIN 1000 MG tablet  Commonly known as:  GLUCOPHAGE  Take 1,000 mg by mouth 2 (two) times daily with a meal.     omeprazole 40 MG capsule  Commonly known as:  PRILOSEC  Take 1 capsule (40 mg total) by mouth every morning.     ondansetron 8 MG disintegrating tablet  Commonly known as:  ZOFRAN-ODT  Take 8 mg by mouth every 12 (twelve) hours as needed for nausea.     ONE TOUCH ULTRA TEST test strip  Generic drug:  glucose blood     potassium chloride SA 20 MEQ tablet  Commonly known as:  K-DUR,KLOR-CON  Take 40 mEq by mouth daily.     spironolactone 25 MG tablet  Commonly known as:  ALDACTONE  Take 25 mg by mouth daily.     VITAMIN B 12 PO  Take 1 tablet by mouth daily.     VOLTAREN 1 % Gel  Generic drug:  diclofenac sodium  Apply 1 application topically 4 (four) times daily as needed. Apply to painful areas on hands for arthritis       Allergies  Allergen Reactions  . Contrast Media [Iodinated Diagnostic Agents] Anaphylaxis  . Gadolinium Shortness Of Breath     Code: SOB, Onset Date: 16109604        Follow-up Information   [facility] In 2 weeks.   Specialty:  General Surgery   Contact information:   [address] [place] [address] [phone]       Follow up with WEAVER, LAYNE C, NP In 10 days.   Specialty:  Nurse Practitioner   Contact information:   [address] [place] [address] [phone]        The results of significant diagnostics from this hospitalization (including imaging, microbiology, ancillary and laboratory) are listed below for reference.    Significant Diagnostic Studies: Dg Chest 2 View  12/19/2012   CLINICAL DATA:  Upper abdominal pain, congestive heart  failure  EXAM: CHEST  2 VIEW  COMPARISON:  Radiograph 10/29/2012  FINDINGS: Normal mediastinum and cardiac silhouette. Normal pulmonary vasculature. No evidence of effusion, infiltrate, or pneumothorax. No acute bony abnormality.  IMPRESSION: No acute cardiopulmonary process.   Electronically Signed   By: Genevive Bi M.D.   On: 12/19/2012 12:40   US Abdomen Complete  12/17/2012   CLINICAL DATA:  Epigastric abdominal pain.  EXAM: ULTRASOUND ABDOMEN COMPLETE  COMPARISON:  October 27, 2012.  FINDINGS: Gallbladder  No definite gallstones are noted. No pericholecystic fluid is noted. Gallbladder wall is mildly thickened at 4 mm. Positive sonographic Murphy's sign is noted.  Common bile duct  Diameter: Measures 6.7 mm with echogenic material in distal common bile duct which potentially may represent stones.  Liver  No focal lesion identified. Within normal limits in parenchymal echogenicity.  IVC  No abnormality visualized.  Pancreas  Visualized portion unremarkable.  Spleen  Size and appearance within normal limits.  Right Kidney  Length: Measures 9.8 cm. Echogenicity within normal limits. No mass or hydronephrosis visualized.  Left Kidney  Length: Measures 13.4 cm. Echogenicity within normal limits. No mass or hydronephrosis visualized.  Abdominal aorta  No aneurysm visualized.  IMPRESSION: No gallstones are identified, although mild gallbladder wall thickening is noted as well as positive sonographic Murphy's sign. Also noted is mild dilatation of common bile duct with possible echogenic material seen in the distal portion which may represent stones suggesting choledocholithiasis.   Electronically Signed   By: Roque Lias M.D.   On: 12/17/2012 18:20   Mr Abdomen Mrcp Wo Cm  12/20/2012   CLINICAL DATA:  Dilated common duct with possible choledocholithiasis on ultrasound. Abdominal/back pain.  EXAM: MRI ABDOMEN WITHOUT  (INCLUDING MRCP)  TECHNIQUE: Multiplanar multisequence MR imaging of the abdomen was  performed. Heavily T2-weighted images of the biliary and pancreatic ducts were obtained, and three-dimensional MRCP images were rendered by post processing.  COMPARISON:  Ultrasound abdomen dated 12/17/2012. CT abdomen pelvis dated 12/29/2011.  FINDINGS: Liver is within normal limits. No hepatic steatosis.  Spleen, pancreas, and adrenal glands are within normal limits.  Gallbladder is notable for suspected fundal adenomyomatosis (series 7/ image 27).  No intrahepatic or extrahepatic ductal dilatation. Common duct measures 4 mm, within normal limits. No choledocholithiasis is seen.  Small bilateral renal cysts measuring up to 9 mm. No hydronephrosis.  No abdominal ascites.  No suspicious abdominal lymphadenopathy.  No focal osseous lesions.  IMPRESSION: Common duct measures 4 mm, within normal limits. No choledocholithiasis is seen.  Suspected gallbladder adenomyomatosis.   Electronically Signed   By: Charline Bills M.D.   On: 12/20/2012 16:57   Mr 3d Recon At Scanner  12/20/2012   CLINICAL DATA:  Dilated common duct with possible choledocholithiasis on ultrasound. Abdominal/back pain.  EXAM: MRI ABDOMEN WITHOUT  (INCLUDING MRCP)  TECHNIQUE: Multiplanar multisequence MR imaging of the abdomen was performed. Heavily T2-weighted images of the biliary and pancreatic ducts were obtained, and three-dimensional MRCP images were rendered by post processing.  COMPARISON:  Ultrasound abdomen dated 12/17/2012. CT abdomen pelvis dated 12/29/2011.  FINDINGS: Liver is within normal limits. No hepatic steatosis.  Spleen, pancreas, and adrenal glands are within normal limits.  Gallbladder is notable for suspected fundal adenomyomatosis (series 7/ image 27).  No intrahepatic or extrahepatic ductal dilatation. Common duct measures 4 mm, within normal limits. No choledocholithiasis is seen.  Small bilateral renal cysts measuring up to 9 mm. No hydronephrosis.  No abdominal ascites.  No suspicious abdominal lymphadenopathy.  No  focal osseous lesions.  IMPRESSION: Common duct measures 4 mm, within normal limits. No choledocholithiasis is seen.  Suspected gallbladder adenomyomatosis.   Electronically Signed   By: Charline Bills  M.D.   On: 12/20/2012 16:57    Microbiology: Recent Results (from the past 240 hour(s))  URINE CULTURE     Status: None   Collection Time    12/18/12  4:30 PM      Result Value Range Status   Culture ESCHERICHIA COLI   Final   Colony Count >=100,000 COLONIES/ML   Final   Organism ID, Bacteria ESCHERICHIA COLI   Final     Labs: Basic Metabolic Panel:  Recent Labs Lab 12/17/12 1714 12/19/12 1147 12/20/12 0550 12/21/12 0515 12/22/12 0600  NA 132* 128* 137 134* 136  K 4.1 3.8 3.9 3.7 3.7  CL 93* 88* 96 93* 95*  CO2 27 27 32 29 28  GLUCOSE 255* 286* 161* 132* 164*  BUN 13 10 11 9 12   CREATININE 0.76 0.62 0.73 0.75 0.67  CALCIUM 10.0 9.6 9.3 9.3 9.5  MG  --   --  1.6 1.5 1.8  PHOS 5.0*  --   --   --   --    Liver Function Tests:  Recent Labs Lab 12/17/12 1714 12/19/12 1147 12/20/12 0550 12/21/12 0515 12/22/12 0600  AST 20 18 17 17 21   ALT 19 16 16 13 15   ALKPHOS 57 55 52 44 44  BILITOT 0.7 0.6 0.4 0.4 0.4  PROT 8.0 8.0 7.5 7.1 7.2  ALBUMIN 4.6  4.6 4.1 3.6 3.5 3.5    Recent Labs Lab 12/17/12 1714 12/19/12 1147 12/20/12 0550  LIPASE 73 73* 49  AMYLASE 33  --   --    CBC:  Recent Labs Lab 12/17/12 1714 12/19/12 1147 12/20/12 0550 12/21/12 0515 12/22/12 0600  WBC 9.8 10.8* 9.8 9.9 8.4  NEUTROABS 5.6 7.6 5.7 5.6 4.9  HGB 15.6* 14.8 14.3 13.5 14.1  HCT 45.1 44.3 42.9 40.1 40.8  MCV 75.4* 77.7* 79.9 79.2 78.8  PLT 197 161 157 157 133*   Cardiac Enzymes:  Recent Labs Lab 12/20/12 0036 12/20/12 0550  TROPONINI <0.30 <0.30   BNP: BNP (last 3 results)  Recent Labs  10/26/12 1700 12/19/12 1147 12/20/12 0036  PROBNP 3089.0* 452.4* 381.2*   CBG:  Recent Labs Lab 12/21/12 0809 12/21/12 1329 12/21/12 1627 12/21/12 2124 12/22/12 0601   GLUCAP 140* 131* 256* 162* 153*    Signed:  Akyra Bouchie  [facility] 12/22/2012, 1:23 PM

## 2012-12-22 NOTE — Progress Notes (Signed)
Pt given discharge instructions and prescriptions, questions answered. Refused a wheelchair. D/c'd iv and telemetry.

## 2012-12-23 ENCOUNTER — Encounter: Payer: Self-pay | Admitting: *Deleted

## 2012-12-23 LAB — GLUCOSE, CAPILLARY: Glucose-Capillary: 316 mg/dL — ABNORMAL HIGH (ref 70–99)

## 2012-12-24 ENCOUNTER — Telehealth (INDEPENDENT_AMBULATORY_CARE_PROVIDER_SITE_OTHER): Payer: Self-pay

## 2012-12-24 ENCOUNTER — Ambulatory Visit (INDEPENDENT_AMBULATORY_CARE_PROVIDER_SITE_OTHER): Payer: Medicare Other | Admitting: Nurse Practitioner

## 2012-12-24 ENCOUNTER — Encounter: Payer: Self-pay | Admitting: Nurse Practitioner

## 2012-12-24 ENCOUNTER — Telehealth (INDEPENDENT_AMBULATORY_CARE_PROVIDER_SITE_OTHER): Payer: Self-pay | Admitting: *Deleted

## 2012-12-24 ENCOUNTER — Other Ambulatory Visit: Payer: Self-pay | Admitting: Nurse Practitioner

## 2012-12-24 VITALS — BP 105/73 | HR 88 | Temp 98.8°F | Resp 16 | Ht 65.0 in | Wt 147.0 lb

## 2012-12-24 DIAGNOSIS — R112 Nausea with vomiting, unspecified: Secondary | ICD-10-CM

## 2012-12-24 DIAGNOSIS — E1165 Type 2 diabetes mellitus with hyperglycemia: Secondary | ICD-10-CM

## 2012-12-24 DIAGNOSIS — B373 Candidiasis of vulva and vagina: Secondary | ICD-10-CM

## 2012-12-24 DIAGNOSIS — IMO0002 Reserved for concepts with insufficient information to code with codable children: Secondary | ICD-10-CM

## 2012-12-24 DIAGNOSIS — R82998 Other abnormal findings in urine: Secondary | ICD-10-CM

## 2012-12-24 DIAGNOSIS — IMO0001 Reserved for inherently not codable concepts without codable children: Secondary | ICD-10-CM

## 2012-12-24 DIAGNOSIS — R109 Unspecified abdominal pain: Secondary | ICD-10-CM

## 2012-12-24 DIAGNOSIS — B3731 Acute candidiasis of vulva and vagina: Secondary | ICD-10-CM

## 2012-12-24 DIAGNOSIS — R8290 Unspecified abnormal findings in urine: Secondary | ICD-10-CM

## 2012-12-24 DIAGNOSIS — R197 Diarrhea, unspecified: Secondary | ICD-10-CM

## 2012-12-24 LAB — POCT URINALYSIS DIPSTICK
Glucose, UA: NEGATIVE
Ketones, UA: NEGATIVE
Protein, UA: NEGATIVE
Spec Grav, UA: 1.01
Urobilinogen, UA: 0.2

## 2012-12-24 MED ORDER — LOPERAMIDE HCL 2 MG PO TABS: ORAL_TABLET | ORAL | Status: DC

## 2012-12-24 MED ORDER — MICONAZOLE NITRATE 2 % VA CREA: 1.0000 | TOPICAL_CREAM | Freq: Every day | VAGINAL | Status: DC

## 2012-12-24 MED ORDER — FLUCONAZOLE 150 MG PO TABS: 150.0000 mg | ORAL_TABLET | Freq: Once | ORAL | Status: DC

## 2012-12-24 NOTE — Telephone Encounter (Signed)
Called pt to notify her that we are working on getting pt scheduled for a hida scan in the next couple of weeks per Dr Dwain Sarna. Our referral cord. Will call her with the appt date and time. Once the pt gets her hida scan appt then she will need to see Dr Dwain Sarna after the hida scan. The pt understands.

## 2012-12-24 NOTE — Telephone Encounter (Signed)
I called pt to inform her of an appt for her GB study at [facility] on 01/06/13 with an arrival time of 6:45am.  I instructed pt to be NPO after midnight and to not take any of her stomach meds the morning of the test.  I also informed pt of her follow up appt with Dr. Dwain Sarna on 01/07/13 with an arrival time of 9:00am.  Pt agreeable to both appts.

## 2012-12-24 NOTE — Patient Instructions (Signed)
I think the antibiotics have caused the vaginal yeast and the diarrhea. Your bottom is excoriated from diarrhea. Use A&D ointment as a barrier so that when you have diarrhea it does not come into contact with your skin. Use spray bottle after having BM to cleanall residue off skin. Pat dry-the less friction, the better. You may apply vaginal cream to red areas twice daily. Start protbiotic or eat activia yogurt daily, for at least 10-14 days. Keep appointment next week if bottom still irritated and/or still having diarrhea. I will call regarding your c-diff test, as you will have to be treated if you have it-it can cause diffuse diarrhea. Continue antibiotic for urinary infection. Start checking fasting sugars next week. Keep a log & bring them in. Feel better.

## 2012-12-24 NOTE — Progress Notes (Signed)
Subjective:    Patient ID: Emily [name], female    DOB: April 18, 1960, 52 y.o.   MRN: [id number]  Vaginal Itching The patient's primary symptoms include genital itching and a genital rash. The patient's pertinent negatives include no vaginal discharge. This is a new problem. The current episode started in the past 7 days. The problem occurs constantly. The problem has been gradually worsening. The patient is experiencing no pain. She is not pregnant. Associated symptoms include back pain, diarrhea (5-6 episodes daily today. ) and a fever (had fever few days ago, none in 2 days). Pertinent negatives include no abdominal pain, chills, dysuria, flank pain, frequency, headaches, hematuria, nausea, rash, urgency or vomiting. Nothing aggravates the symptoms. She has tried nothing for the symptoms. She is not sexually active. She is postmenopausal. (Recent hospitalization for abd pain, nausea, elevated lipase. Seems finding of e. coli in urine was incidental, but may have contributed to her symptoms.. Pt started on high dose levaquin 2 days ago.)  Diarrhea  Associated symptoms include a fever (had fever few days ago, none in 2 days). Pertinent negatives include no abdominal pain, chills, coughing, headaches or vomiting. recent hospitalization for abd pain, nausea, elevated lipase. Seems finding of e. coli in urine was incidental, but may have contributed to her symptoms.. Pt started on high dose levaquin 2 days ago.      Review of Systems  Constitutional: Positive for fever (had fever few days ago, none in 2 days) and fatigue. Negative for chills.  Respiratory: Negative for cough, chest tightness, shortness of breath and wheezing.   Cardiovascular: Negative for chest pain, palpitations and leg swelling.  Gastrointestinal: Positive for diarrhea (5-6 episodes daily today. ). Negative for nausea, vomiting, abdominal pain and abdominal distention.  Genitourinary: Negative for dysuria, urgency, frequency,  hematuria, flank pain and vaginal discharge.       C/o foul odor to urine   Musculoskeletal: Positive for back pain.  Skin: Positive for pallor (gray tone). Negative for rash.  Neurological: Negative for headaches.  Psychiatric/Behavioral: Positive for sleep disturbance (secondary to pain in legs/neuropathy).       Objective:   Physical Exam  Vitals reviewed. Constitutional: She is oriented to person, place, and time. No distress.  Thin face & limbs, thick trunk. "squirrelly" constant movement- pt states has been this way since developed neuropathy in legs.  HENT:  Head: Normocephalic and atraumatic.  Eyes: Conjunctivae are normal. Right eye exhibits no discharge. Left eye exhibits no discharge.  Cardiovascular: Normal rate, regular rhythm and normal heart sounds.   No murmur heard. Pulmonary/Chest: Effort normal and breath sounds normal. No respiratory distress. She has no wheezes.  Abdominal: Soft. She exhibits no distension and no mass. There is tenderness (epigastric & LUQ). There is no guarding.  Genitourinary:    There is rash on the right labia. There is rash on the left labia. Vaginal discharge found.  Musculoskeletal: She exhibits tenderness (bilat CVA tenderness).  Neurological: She is alert and oriented to person, place, and time.  Skin: Skin is warm and dry. Rash (excoriated perineal skin. See GU assmt. ) noted. There is pallor.  Psychiatric: She has a normal mood and affect. Her behavior is normal. Thought content normal.          Assessment & Plan:  1. Diarrhea Likely due to recent antibiotic regimen. D/c'd from hospital on 500 mg levaquin X 7 days for e/coli in urine, febrile. DD: C-diff as 2 recent hospitalizations.   - Clostridium Difficile by  PCR - loperamide (IMODIUM A-D) 2 MG tablet; Take 2 Tabs po initially, then 1 T po after each loose stool. Do not exceed 8 tabs daily.  Dispense: 30 tablet; Refill: 1  2. Bad odor of urine Pt reports this for more than  1 week. Had fever in hospital, urine culture collected in ofc grew e/coli. Current treatment: levaquin 500mg  daily for 5 more days. This is pyelo dose.  - POCT urinalysis dipstick- trace leuks & mod blood  3. Vaginal yeast infection Likely secondary to abx use. DD: skin excoriation/caustic dermatitis from diarrhea. - fluconazole (DIFLUCAN) 150 MG tablet; Take 1 tablet (150 mg total) by mouth once.  Dispense: 1 tablet; Refill: 1 - miconazole (MONISTAT 7) 2 % vaginal cream; Place 7 Applicatorfuls vaginally at bedtime.  Dispense: 45 g; Refill: 1 A&D ointment as barrier. See pt instructions.  4. DM (diabetes mellitus), type 2, uncontrolled Current Tx metformin at max dose. Needed insulin in hospital-not sure if this is due to infection/diet Pt said she was not on diabetic diet when she had solid food in hospital-had pancakes w/[employer]. Pt will begin logging fasting BS for 10 days beginning next week. Will eval & TX as needed at f/u.

## 2012-12-24 NOTE — Addendum Note (Signed)
Addended by: Alben Spittle, Konrad Felix [facility] on: 12/24/2012 04:28 PM   Modules accepted: Orders

## 2012-12-25 ENCOUNTER — Ambulatory Visit (HOSPITAL_COMMUNITY)
Admission: RE | Admit: 2012-12-25 | Discharge: 2012-12-25 | Disposition: A | Discharge status: Home / Self Care | Payer: Medicare Other | Source: Ambulatory Visit | Attending: Cardiology | Admitting: Cardiology

## 2012-12-25 DIAGNOSIS — F3289 Other specified depressive episodes: Secondary | ICD-10-CM

## 2012-12-25 DIAGNOSIS — F329 Major depressive disorder, single episode, unspecified: Secondary | ICD-10-CM

## 2012-12-26 ENCOUNTER — Telehealth: Payer: Self-pay | Admitting: Nurse Practitioner

## 2012-12-26 LAB — URINE CULTURE: Colony Count: NO GROWTH

## 2012-12-26 NOTE — Telephone Encounter (Signed)
No growth in urine. Advised to stop levaquin. Diarrhea better with immodium. Pt left stool sample today-need to r/o c diff as cause of diarrhea as she has had 2 hospitalizations in last 8 wks. Vaginal itching improved. Perineal skin getting better.  Discussed sleep study as pt told she stopped breathing while sleeping when she was in hospital. Pt undecided. Pt felt like food getting stuck in throat yesterday. Advised she may need to go back on zoloft as may be anxiety r/t. Had endo 7/10 in [place]. Reviewed scanned report-nml.

## 2012-12-27 LAB — CLOSTRIDIUM DIFFICILE BY PCR: Toxigenic C. Difficile by PCR: NOT DETECTED

## 2013-01-02 ENCOUNTER — Other Ambulatory Visit: Payer: Self-pay | Admitting: Nurse Practitioner

## 2013-01-02 ENCOUNTER — Telehealth: Payer: Self-pay | Admitting: Nurse Practitioner

## 2013-01-02 ENCOUNTER — Ambulatory Visit: Payer: Medicare Other | Admitting: *Deleted

## 2013-01-02 DIAGNOSIS — R112 Nausea with vomiting, unspecified: Secondary | ICD-10-CM

## 2013-01-02 MED ORDER — ONDANSETRON HCL 8 MG PO TABS: 8.0000 mg | ORAL_TABLET | Freq: Three times a day (TID) | ORAL | Status: DC | PRN

## 2013-01-02 NOTE — Telephone Encounter (Signed)
continues to have waves of nausea. Upper back pain last few days. Will check FBS & bring to ofc next appt. Plan to see surgeon next week. Nutrition class today. Asked for oral tab zofran rather than ODT-does not like taste.

## 2013-01-06 ENCOUNTER — Encounter (HOSPITAL_COMMUNITY): Payer: Self-pay

## 2013-01-06 ENCOUNTER — Encounter (HOSPITAL_COMMUNITY)
Admission: RE | Admit: 2013-01-06 | Discharge: 2013-01-06 | Disposition: A | Discharge status: Home / Self Care | Payer: Medicare Other | Source: Ambulatory Visit | Attending: General Surgery | Admitting: General Surgery

## 2013-01-06 DIAGNOSIS — R109 Unspecified abdominal pain: Secondary | ICD-10-CM | POA: Insufficient documentation

## 2013-01-06 DIAGNOSIS — R112 Nausea with vomiting, unspecified: Secondary | ICD-10-CM

## 2013-01-06 MED ORDER — TECHNETIUM TC 99M MEBROFENIN IV KIT
5.2000 | PACK | Freq: Once | INTRAVENOUS | Status: AC | PRN
  Administered 2013-01-06: 5.2 via INTRAVENOUS

## 2013-01-07 ENCOUNTER — Encounter (INDEPENDENT_AMBULATORY_CARE_PROVIDER_SITE_OTHER): Payer: Self-pay | Admitting: General Surgery

## 2013-01-07 ENCOUNTER — Ambulatory Visit (INDEPENDENT_AMBULATORY_CARE_PROVIDER_SITE_OTHER): Payer: Medicare Other | Admitting: General Surgery

## 2013-01-07 VITALS — BP 128/72 | HR 80 | Temp 97.6°F | Resp 14 | Ht 65.0 in | Wt 145.0 lb

## 2013-01-07 DIAGNOSIS — D135 Benign neoplasm of extrahepatic bile ducts: Secondary | ICD-10-CM

## 2013-01-07 DIAGNOSIS — K859 Acute pancreatitis without necrosis or infection, unspecified: Secondary | ICD-10-CM

## 2013-01-07 DIAGNOSIS — D134 Benign neoplasm of liver: Secondary | ICD-10-CM
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
## 2013-01-08 ENCOUNTER — Ambulatory Visit (HOSPITAL_COMMUNITY)
Admission: RE | Admit: 2013-01-08 | Discharge: 2013-01-08 | Disposition: A | Discharge status: Home / Self Care | Payer: Medicare Other | Source: Ambulatory Visit | Attending: Internal Medicine | Admitting: Internal Medicine

## 2013-01-08 VITALS — BP 94/58 | HR 94 | Wt 144.5 lb

## 2013-01-08 DIAGNOSIS — I428 Other cardiomyopathies: Secondary | ICD-10-CM | POA: Insufficient documentation

## 2013-01-08 DIAGNOSIS — I5022 Chronic systolic (congestive) heart failure: Secondary | ICD-10-CM

## 2013-01-08 DIAGNOSIS — I10 Essential (primary) hypertension: Secondary | ICD-10-CM | POA: Insufficient documentation

## 2013-01-08 DIAGNOSIS — J449 Chronic obstructive pulmonary disease, unspecified: Secondary | ICD-10-CM | POA: Insufficient documentation

## 2013-01-08 DIAGNOSIS — I509 Heart failure, unspecified: Secondary | ICD-10-CM

## 2013-01-08 DIAGNOSIS — E785 Hyperlipidemia, unspecified: Secondary | ICD-10-CM | POA: Insufficient documentation

## 2013-01-08 DIAGNOSIS — Z87891 Personal history of nicotine dependence: Secondary | ICD-10-CM | POA: Insufficient documentation

## 2013-01-08 DIAGNOSIS — J4489 Other specified chronic obstructive pulmonary disease: Secondary | ICD-10-CM | POA: Insufficient documentation

## 2013-01-08 DIAGNOSIS — Z853 Personal history of malignant neoplasm of breast: Secondary | ICD-10-CM | POA: Insufficient documentation

## 2013-01-08 DIAGNOSIS — E119 Type 2 diabetes mellitus without complications: Secondary | ICD-10-CM | POA: Insufficient documentation

## 2013-01-08 DIAGNOSIS — Z01810 Encounter for preprocedural cardiovascular examination: Secondary | ICD-10-CM

## 2013-01-08 DIAGNOSIS — I2510 Atherosclerotic heart disease of native coronary artery without angina pectoris: Secondary | ICD-10-CM | POA: Insufficient documentation

## 2013-01-08 MED ORDER — CARVEDILOL 6.25 MG PO TABS: 6.2500 mg | ORAL_TABLET | Freq: Two times a day (BID) | ORAL | Status: DC

## 2013-01-08 NOTE — Progress Notes (Signed)
Patient ID: Emily [name], female   DOB: 09/19/1960, 52 y.o.   MRN: [id number] PCP: Dr Alben Spittle  52 yo with history of breast cancer and COPD presents for outpatient followup after recent admission with acute systolic CHF and the new finding of nonischemic cardiomyopathy.  Patient was admitted in 8/14 with acute pulmonary edema and volume overload.  LHC showed moderate nonobstructive CAD.  She was started on milrinone and diuresed.  She diuresed vigorously and was titrated off milrinone.  Cause of cardiomyopathy suspected to be Adriamycin toxicity.   She returns for follow up. Last visit enalapril was cut back to 5 mg twice daily as well as lasix was cut back to 40 mg daily. Denies SOB/PND/Orthopnea/CP. Dizziness resolved. Blurred vision resolved. Able to walk up steps. Weight at [id number] pounds. Compliant with medications. Smoking 5 cigarettes per day.    ECHO 10/27/12 EF 20-25%  ECG: NSR, normal QRS interval, nonspecific T wave flattening.   12/05/12 Carotid Dopplers- R 39 % stenosis and L  40-59% stenosis  Admitted in 10/14 with abdominal pain fever. Found to have UTI and gallbladder adenomyomatosis. Has seen Dr. Dwain Sarna and plan is for lap chole.   Follow-up: Doing very well. Only complaint is back pain and some mild RUQ pain. Breathing is good. Can do most activities without problem. SBP running in the 90s. Occasionally dizzy. No edema, orthopnea or PND. Could not tolerate cMRI due to claustrophobia.    Labs (9/14): digoxin 0.4, K 3.6, creatinine 0.62 Labs (9/15): K 4.4 creatinine 0.68 Labs (10/26):    K 3.7 Cr 0.67  PMH: 1. COPD: PFTs (1/14) with FEV1 68%.  Quit smoking 9/14.  2. Type II diabetes  3. HTN 4. Breast cancer: 2009, s/p Adriamycin/Cytoxan/Taxol chemo, lumpectomy and radiation.   5. Nonischemic cardiomyopathy: Possibly due to Adriamycin.  Echo (8/14) with EF 20-25%, global hypokinesis, moderate diastolic dysfunction, normal RV size and with mild to moderately decreased  systolic function, moderate TR.   6. CAD: LHC (8/14) with nonobstructive CAD; 40-50% mLAD, 50-70% LCx, 50% PDA.  7. Hyperlipidemia  SH: Lives alone in [place].  Mother is next door.  Quit smoking in 9/14.  FH: No premature CAD.  No family history of cardiomyopathy.   ROS: All systems reviewed and negative except as per HPI.   Current Outpatient Prescriptions  Medication Sig Dispense Refill  . aspirin EC 81 MG tablet Take 81 mg by mouth daily.      Marland Kitchen atorvastatin (LIPITOR) 20 MG tablet Take 1 tablet (20 mg total) by mouth daily.  30 tablet  1  . calcium-vitamin D (OSCAL WITH D) 500-200 MG-UNIT per tablet Take 1 tablet by mouth 2 (two) times daily.      . carvedilol (COREG) 3.125 MG tablet Take 3.125 mg by mouth every morning.       . carvedilol (COREG) 6.25 MG tablet Take 6.25 mg by mouth every evening.      . Cyanocobalamin (VITAMIN B 12 PO) Take 1 tablet by mouth daily.      . diclofenac sodium (VOLTAREN) 1 % GEL Apply 1 application topically 4 (four) times daily as needed. Apply to painful areas on hands for arthritis      . digoxin (LANOXIN) 0.125 MG tablet Take 0.125 mg by mouth daily.      . enalapril (VASOTEC) 10 MG tablet Take 5 mg by mouth 2 (two) times daily.      . ferrous sulfate 325 (65 FE) MG tablet Take 325 mg by mouth  at bedtime.       . furosemide (LASIX) 40 MG tablet Take 40 mg by mouth daily. Take extra tab if needed for wt gain      . gabapentin (NEURONTIN) 400 MG tablet Take 800 mg by mouth at bedtime.       Marland Kitchen HYDROcodone-acetaminophen (NORCO/VICODIN) 5-325 MG per tablet Take 1 tablet by mouth every 6 (six) hours as needed for pain.  30 tablet  0  . hydrOXYzine (ATARAX/VISTARIL) 25 MG tablet Take 25 mg by mouth at bedtime.       Marland Kitchen letrozole (FEMARA) 2.5 MG tablet Take 2.5 mg by mouth every morning.      Marland Kitchen LORazepam (ATIVAN) 2 MG tablet Take 2 mg by mouth at bedtime.       . magnesium oxide (MAG-OX) 400 MG tablet Take 400 mg by mouth daily.      . metFORMIN  (GLUCOPHAGE) 1000 MG tablet Take 1,000 mg by mouth 2 (two) times daily with a meal.       . omeprazole (PRILOSEC) 40 MG capsule Take 1 capsule (40 mg total) by mouth every morning.  30 capsule  1  . ondansetron (ZOFRAN) 8 MG tablet Take 1 tablet (8 mg total) by mouth every 8 (eight) hours as needed for nausea or vomiting.  20 tablet  0  . ONE TOUCH ULTRA TEST test strip       . potassium chloride SA (K-DUR,KLOR-CON) 20 MEQ tablet Take 40 mEq by mouth daily.      Marland Kitchen spironolactone (ALDACTONE) 25 MG tablet Take 25 mg by mouth daily.       No current facility-administered medications for this encounter.    BP 94/58  Pulse 94  Wt 144 lb 8 oz (65.545 kg)  SpO2 95% General: NAD Neck: JVP flat, no thyromegaly or thyroid nodule.  Lungs: Clear to auscultation bilaterally with normal respiratory effort. Decreased BS throughout.  CV: Nondisplaced PMI.  Heart regular S1/S2, no S3/S4, no murmur.  No peripheral edema.  No carotid bruit.  Normal pedal pulses.  Abdomen: Soft, mildly tender, no hepatosplenomegaly, no distention.  Skin: Intact without lesions or rashes.  Neurologic: Alert and oriented x 3.  Psych: Normal affect. Extremities: No clubbing or cyanosis.  HEENT: Normal.   Assessment/Plan: 1. Chronic systolic CHF: Nonischemic cardiomyopathy.  Likely Adriamycin toxicity.   --Doing quite well.  --Volume status stable. NYHA class II. ECHO 10/27/12 EF 20-25% 09/2012  -Continune Lasix to 40 mg daily.  Continue spironolactone 25 mg daily - Continue enalapril to 5 mg twice a day  - Will increase am Coreg to 6.25 mg in am and continue night time carvedilol at 6.25 mg . She has not tolerated increase in the past but hopefully she will tolerate the night time increase.   --Continue digoxin 0.125 mg daily. (dig level 0.4  11/05/2012) -Reinforced need for daily weights and reviewed use of sliding scale diuretics. - Will repeat ECHO  2. CAD: Moderate nonobstructive disease.  Continue ASA 81 and statin.   Check lipid panel today.  3. COPD: Still smoking a few cigarettes. I have encouraged her to stop 4. Gallbladder disease: she is likely at mild to moderate risk of peri-operative cardiac problems. But is ok to proceed. Will get echo prior to surgery to have clear picture of where EF stands now.   Follow up in 1 month for additional medication titration and consideration of ICD  Holly Bodily 01/08/2013

## 2013-01-08 NOTE — Progress Notes (Signed)
Can schedule her for after echo done

## 2013-01-08 NOTE — Patient Instructions (Signed)
Increase Carvedilol 6.25 mg Twice daily   Your physician recommends that you schedule a follow-up appointment in: 1 month

## 2013-01-08 NOTE — Addendum Note (Signed)
Encounter addended by: Noralee Space, RN on: 01/08/2013  2:20 PM<BR>     Documentation filed: Medications, Patient Instructions Section, Orders

## 2013-01-09 NOTE — Addendum Note (Signed)
Encounter addended by: Theresia Bough, CMA on: 01/09/2013 12:08 PM<BR>     Documentation filed: Orders

## 2013-01-10 ENCOUNTER — Other Ambulatory Visit: Payer: Self-pay | Admitting: Nurse Practitioner

## 2013-01-10 ENCOUNTER — Ambulatory Visit (HOSPITAL_COMMUNITY)
Admission: RE | Admit: 2013-01-10 | Discharge: 2013-01-10 | Disposition: A | Discharge status: Home / Self Care | Payer: Medicare Other | Source: Ambulatory Visit | Attending: Nurse Practitioner | Admitting: Nurse Practitioner

## 2013-01-10 DIAGNOSIS — E119 Type 2 diabetes mellitus without complications: Secondary | ICD-10-CM | POA: Insufficient documentation

## 2013-01-10 DIAGNOSIS — Z87891 Personal history of nicotine dependence: Secondary | ICD-10-CM | POA: Insufficient documentation

## 2013-01-10 DIAGNOSIS — I517 Cardiomegaly: Secondary | ICD-10-CM

## 2013-01-10 DIAGNOSIS — D649 Anemia, unspecified: Secondary | ICD-10-CM | POA: Insufficient documentation

## 2013-01-10 DIAGNOSIS — C50919 Malignant neoplasm of unspecified site of unspecified female breast: Secondary | ICD-10-CM | POA: Insufficient documentation

## 2013-01-10 DIAGNOSIS — I10 Essential (primary) hypertension: Secondary | ICD-10-CM | POA: Insufficient documentation

## 2013-01-10 DIAGNOSIS — I509 Heart failure, unspecified: Secondary | ICD-10-CM | POA: Insufficient documentation

## 2013-01-10 DIAGNOSIS — I5022 Chronic systolic (congestive) heart failure: Secondary | ICD-10-CM

## 2013-01-10 DIAGNOSIS — K219 Gastro-esophageal reflux disease without esophagitis: Secondary | ICD-10-CM | POA: Insufficient documentation

## 2013-01-10 DIAGNOSIS — F411 Generalized anxiety disorder: Secondary | ICD-10-CM

## 2013-01-10 DIAGNOSIS — I2510 Atherosclerotic heart disease of native coronary artery without angina pectoris: Secondary | ICD-10-CM | POA: Insufficient documentation

## 2013-01-10 NOTE — Telephone Encounter (Signed)
Refill request for Lorazepam Last filled by MD on - Never filled Last Appt: 12/24/12 Next Appt: 02/04/13 Please advise refill?

## 2013-01-10 NOTE — Progress Notes (Signed)
*  PRELIMINARY RESULTS* Echocardiogram 2D Echocardiogram has been performed.  Emily [name] 01/10/2013, 4:32 PM

## 2013-01-10 NOTE — Telephone Encounter (Signed)
Patient needs refill on Lorazepam faxed to [facility] in [facility]

## 2013-01-13 ENCOUNTER — Ambulatory Visit: Payer: Medicare Other | Admitting: Nurse Practitioner

## 2013-01-13 MED ORDER — LORAZEPAM 2 MG PO TABS: 2.0000 mg | ORAL_TABLET | Freq: Every day | ORAL | Status: DC

## 2013-01-14 ENCOUNTER — Telehealth: Payer: Self-pay | Admitting: *Deleted

## 2013-01-14 NOTE — Telephone Encounter (Signed)
Patient left vm letting us know that [facility] pharmacy told patient that they did not receive rx that was faxed over to pharmacy 01/10/13. Called rx into pharmacy for patient. Called patient to let her know.

## 2013-01-21 ENCOUNTER — Ambulatory Visit: Payer: Medicare Other | Admitting: *Deleted

## 2013-02-04 ENCOUNTER — Ambulatory Visit (INDEPENDENT_AMBULATORY_CARE_PROVIDER_SITE_OTHER): Payer: Medicare Other | Admitting: Nurse Practitioner

## 2013-02-04 ENCOUNTER — Encounter: Payer: Self-pay | Admitting: Nurse Practitioner

## 2013-02-04 VITALS — BP 92/54 | HR 90 | Temp 98.2°F | Ht 65.0 in | Wt 144.5 lb

## 2013-02-04 DIAGNOSIS — R062 Wheezing: Secondary | ICD-10-CM

## 2013-02-04 DIAGNOSIS — E119 Type 2 diabetes mellitus without complications: Secondary | ICD-10-CM

## 2013-02-04 DIAGNOSIS — G4700 Insomnia, unspecified: Secondary | ICD-10-CM

## 2013-02-04 DIAGNOSIS — F411 Generalized anxiety disorder: Secondary | ICD-10-CM

## 2013-02-04 MED ORDER — LORAZEPAM 2 MG PO TABS: 2.0000 mg | ORAL_TABLET | Freq: Every day | ORAL | Status: DC

## 2013-02-04 MED ORDER — INSULIN DETEMIR 100 UNIT/ML FLEXPEN: 10.0000 [IU] | PEN_INJECTOR | Freq: Every evening | SUBCUTANEOUS | Status: DC | PRN

## 2013-02-04 MED ORDER — ALBUTEROL SULFATE HFA 108 (90 BASE) MCG/ACT IN AERS: INHALATION_SPRAY | RESPIRATORY_TRACT | Status: DC

## 2013-02-04 NOTE — Progress Notes (Signed)
Pre-visit discussion using our clinic review tool. No additional management support is needed unless otherwise documented below in the visit note.  

## 2013-02-04 NOTE — Progress Notes (Signed)
Diabetes Follow-Up Visit Chief Complaint:  Emily [name] has been treated for DM 2 for several years by historical provider. When she presented to me 3 mos ago, she reported low home CBG in 60's. She also had new diagnosis of CHF requiring hospitalization at which time she diuresed 30 pounds of fluid. Given her report of low CBG & recent CHF, I stopped sulfonylurea, but continued metformin. We discussed new diet changes including carb counting and sodium restriction. Her HgBA1C at that time was 9.6. She was to record home blood sugars and f/u in 1 mo. She missed an appointment due to other illnesses including abdominal pain (w/u showed adenomyoma on GB and UTI) and back pain. She presents today, having lost 4 more pounds since last visit; continues to report nausea. She looks the best I've seen her-color is good. Her most recent HgbA1C is slightly improved since diet changes 9.3. However, she reports recent fasting blood sugar readings of 275 & 300. She did well using insulin when she in the hospital 1 month ago. Today we discussed starting Levemir.She is scheduled to have cholecystectomy in January. Her recent echo showed EF of 20-25%. Chief Complaint  Patient presents with  . Follow-up    2 month     Exam  Edema:  absent  Polyuria:  denies  Polydipsia: denies Polyphagia:  Reports eating late at night  BMI:  Body mass index is 24.05 kg/(m^2).   Weight changes:  Lost 4 pounds in 6 weeks General Appearance:  alert, oriented, no acute distress and getting preogressively thinner. Pt reports wearing 4 sizes smaller than 6 mos ago(was in 16, now wearing 8). Mood/Affect:  cherful, talkative, kinetic, expresses some personal stress w/family. Heart: RRR, no edema in LE Lungs: bilateral wheezes, some clearing with cough  HPI:  See above. Pt misses nutrition appt. Has rescheduled for Jan.  Low fat/carbohydrate diet?  Yes Nicotine Abuse?  Yes Medication Compliance?  Yes Exercise?  No Alcohol Abuse?   No   Lab Results  Component Value Date   HGBA1C 9.3* 12/20/2012    Lab Results  Component Value Date   MICROALBUR 0.6 11/15/2012    Lab Results  Component Value Date   CHOL 140 12/20/2012   HDL 36* 12/20/2012   LDLCALC 71 12/20/2012   LDLDIRECT 150 06/21/2012   TRIG 165* 12/20/2012   CHOLHDL 3.9 12/20/2012      Assessment: 1.  Diabetes. Need to improve sugar control 2.  Blood Pressure.  Well controlled 3.  Lipids.  On statin 4.  Foot Care.  Full sensation in feet using monofilment, continues to c/o LE tingling & pain-prevents sleep. Wonder if coming from back?  Recommendations: 1.  Patient is counseled on appropriate foot care. 2.  BP goal < 130/80. 3.  LDL goal of < 100, HDL > 40 and TG < 150. 4.  Eye Exam yearly and Dental Exam every 6 months. 5.  Dietary recommendations:  Low carb. Will see nutrtionist next mo. 6.  Physical Activity recommendations:  Daily walk 10 mins, add 5 mins as tolerated 7.  Medication recommendations at this time are as follows: Continue metformin. Start Levemir 5u Stone q evening. Monitor fasting blood sugar daily. Will call pt end of week to inquire numbers. 8.  Return to clinic in 3 months or sooner if needed.     Physician time spent with patient: 30 minutes-taught how to use levemir pen.      Jerred Zaremba, Gwenith Daily, NP

## 2013-02-04 NOTE — Patient Instructions (Signed)
I am starting insulin. Please check morning blood sugars.Iwill call you in a couple of days to get blood sugar readings. Use inhaler twice daily. Read 1 hour before you want to fall asleep. Stretch legs before bed time.

## 2013-02-06 ENCOUNTER — Telehealth: Payer: Self-pay | Admitting: Nurse Practitioner

## 2013-02-06 DIAGNOSIS — E119 Type 2 diabetes mellitus without complications: Secondary | ICD-10-CM

## 2013-02-06 DIAGNOSIS — C50919 Malignant neoplasm of unspecified site of unspecified female breast: Secondary | ICD-10-CM

## 2013-02-06 MED ORDER — METFORMIN HCL 1000 MG PO TABS: 1000.0000 mg | ORAL_TABLET | Freq: Two times a day (BID) | ORAL | Status: DC

## 2013-02-06 MED ORDER — INSULIN DETEMIR 100 UNIT/ML FLEXPEN: 10.0000 [IU] | PEN_INJECTOR | Freq: Every day | SUBCUTANEOUS | Status: DC

## 2013-02-06 NOTE — Telephone Encounter (Signed)
Please call pt, ask what the issue is. I just changed the sig on the Levemir this am.

## 2013-02-06 NOTE — Telephone Encounter (Signed)
Patient left vm stating that she can not afford the Levemir pen. Patient wanted to know if there is a cheaper alternative. Patient also stated that meanwhile she is going to look on line at the Levemir web site to see if they have an assistance program. Patient stated that she has not started using the Levemir pen that we gave yet. Please advise?

## 2013-02-06 NOTE — Telephone Encounter (Signed)
Patient is requesting a call back to discuss changing the way her script is written

## 2013-02-06 NOTE — Telephone Encounter (Signed)
LMOVM for patient to return call. Need to know what is wrong with script.

## 2013-02-06 NOTE — Telephone Encounter (Signed)
Patient can not afford the Levemir and she needs another option to try that would be more affordable.

## 2013-02-10 ENCOUNTER — Other Ambulatory Visit: Payer: Self-pay | Admitting: *Deleted

## 2013-02-10 DIAGNOSIS — C50919 Malignant neoplasm of unspecified site of unspecified female breast: Secondary | ICD-10-CM

## 2013-02-10 MED ORDER — OMEPRAZOLE 40 MG PO CPDR: 40.0000 mg | DELAYED_RELEASE_CAPSULE | Freq: Every morning | ORAL | Status: DC

## 2013-02-11 ENCOUNTER — Other Ambulatory Visit: Payer: Self-pay | Admitting: Nurse Practitioner

## 2013-02-11 DIAGNOSIS — E119 Type 2 diabetes mellitus without complications: Secondary | ICD-10-CM

## 2013-02-11 MED ORDER — INSULIN DETEMIR 100 UNIT/ML ~~LOC~~ SOLN: 10.0000 [IU] | Freq: Every day | SUBCUTANEOUS | Status: DC

## 2013-02-11 MED ORDER — "INSULIN SYRINGE-NEEDLE U-100 27G X 1/2"" 0.5 ML MISC": 1.0000 | Freq: Every day | Status: DC

## 2013-02-11 NOTE — Telephone Encounter (Signed)
Patient stated that her morning blood sugars are 235, 238, 233. Patient said that the way her rx is written its like [facility] months supply at [facility]. That is why it is so expensive. Patient stated that if you want to prescribe her the multidose that would be fine. I suggested to patient to check with a few other pharmacies to see if she can get rx cheaper elsewhere.

## 2013-02-11 NOTE — Progress Notes (Signed)
Patient notified

## 2013-02-11 NOTE — Telephone Encounter (Signed)
Patient notified

## 2013-02-11 NOTE — Telephone Encounter (Signed)
Pt is to increase levemir to 10 units daily. I will prescribe mDV if cheaper.

## 2013-02-13 ENCOUNTER — Ambulatory Visit (HOSPITAL_COMMUNITY)
Admission: RE | Admit: 2013-02-13 | Discharge: 2013-02-13 | Disposition: A | Discharge status: Home / Self Care | Payer: Medicare Other | Source: Ambulatory Visit | Attending: Internal Medicine | Admitting: Internal Medicine

## 2013-02-13 VITALS — BP 118/74 | HR 87 | Wt 145.5 lb

## 2013-02-13 DIAGNOSIS — I509 Heart failure, unspecified: Secondary | ICD-10-CM | POA: Insufficient documentation

## 2013-02-13 DIAGNOSIS — I2510 Atherosclerotic heart disease of native coronary artery without angina pectoris: Secondary | ICD-10-CM

## 2013-02-13 DIAGNOSIS — Z01810 Encounter for preprocedural cardiovascular examination: Secondary | ICD-10-CM

## 2013-02-13 DIAGNOSIS — I5022 Chronic systolic (congestive) heart failure: Secondary | ICD-10-CM

## 2013-02-13 MED ORDER — ENALAPRIL MALEATE 10 MG PO TABS: ORAL_TABLET | ORAL | Status: DC

## 2013-02-13 NOTE — Addendum Note (Signed)
Encounter addended by: Noralee Space, RN on: 02/13/2013  2:44 PM<BR>     Documentation filed: Patient Instructions Section, Orders

## 2013-02-13 NOTE — Patient Instructions (Signed)
Increase Enalapril to 5 mg (1/2 tab) in AM and 10 mg (1 tab) in PM  Your physician recommends that you schedule a follow-up appointment in: 1 month

## 2013-02-13 NOTE — Progress Notes (Signed)
Patient ID: Emily [name], female   DOB: Feb 23, 1961, 52 y.o.   MRN: [id number] PCP: Dr Alben Spittle  Emily [name] is a 52 yo woman with a history of breast cancer, DM2, COPD and systolic CHF due to nonischemic cardiomyopathy.  Patient was admitted in 8/14 with acute pulmonary edema and cardiogenic shock.  LHC showed moderate nonobstructive CAD (40-50% mLAD, 50-70% LCx, 50% PDA).  She was started on milrinone and diuresed.  Cause of cardiomyopathy suspected to be Adriamycin toxicity.    ECHO 10/27/12 EF 20-25% ECHO 01/09/13 EF 30-35%  ECG: NSR, normal QRS interval, nonspecific T wave flattening.   12/05/12 Carotid Dopplers- R 39 % stenosis and L  40-59% stenosis  Admitted in 10/14 with abdominal pain fever. Found to have UTI and gallbladder adenomyomatosis. Has seen Dr. Dwain Sarna and plan is for lap chole on January 8.   Follow-up: Has had trouble tolerating uptitration of meds due to hypotension. Last visit carvedilol increased to 6.25 bid. Having trouble with high blood sugars now on insulin. Also still with intermittent RUQ. Denies SOB.No edema, orthopnea, PND. Weighing every day ~143 pounds. Tolerating carvedilol increase. SBP 90-110. Back to smoking 1 ppd.   Labs (9/14): digoxin 0.4, K 3.6, creatinine 0.62 Labs (9/15): K 4.4 creatinine 0.68 Labs (10/26):    K 3.7 Cr 0.67  PMH: 1. COPD: PFTs (1/14) with FEV1 68%.  Quit smoking 9/14.  2. Type II diabetes  3. HTN 4. Breast cancer: 2009, s/p Adriamycin/Cytoxan/Taxol chemo, lumpectomy and radiation.   5. Nonischemic cardiomyopathy: Possibly due to Adriamycin.  Echo (8/14) with EF 20-25%, global hypokinesis, moderate diastolic dysfunction, normal RV size and with mild to moderately decreased systolic function, moderate TR.   6. CAD: LHC (8/14) with nonobstructive CAD; 40-50% mLAD, 50-70% LCx, 50% PDA.  7. Hyperlipidemia  SH: Lives alone in [place].  Mother is next door.  Quit smoking in 9/14.  FH: No premature CAD.  No family history of  cardiomyopathy.   ROS: All systems reviewed and negative except as per HPI.   Current Outpatient Prescriptions  Medication Sig Dispense Refill  . albuterol (PROVENTIL HFA;VENTOLIN HFA) 108 (90 BASE) MCG/ACT inhaler Take 2 puffs twice daily.  1 Inhaler  0  . aspirin EC 81 MG tablet Take 81 mg by mouth daily.      Marland Kitchen atorvastatin (LIPITOR) 20 MG tablet Take 1 tablet (20 mg total) by mouth daily.  30 tablet  1  . calcium-vitamin D (OSCAL WITH D) 500-200 MG-UNIT per tablet Take 1 tablet by mouth 2 (two) times daily.      . carvedilol (COREG) 6.25 MG tablet Take 1 tablet (6.25 mg total) by mouth 2 (two) times daily with a meal.  60 tablet  6  . Cyanocobalamin (VITAMIN B 12 PO) Take 1 tablet by mouth daily.      . diclofenac sodium (VOLTAREN) 1 % GEL Apply 1 application topically 4 (four) times daily as needed. Apply to painful areas on hands for arthritis      . digoxin (LANOXIN) 0.125 MG tablet Take 0.125 mg by mouth daily.      . enalapril (VASOTEC) 10 MG tablet Take 5 mg by mouth 2 (two) times daily.      . ferrous sulfate 325 (65 FE) MG tablet Take 325 mg by mouth at bedtime.       . furosemide (LASIX) 40 MG tablet Take 40 mg by mouth daily. Take extra tab if needed for wt gain      .  gabapentin (NEURONTIN) 400 MG tablet Take 800 mg by mouth at bedtime.       Marland Kitchen HYDROcodone-acetaminophen (NORCO/VICODIN) 5-325 MG per tablet Take 1 tablet by mouth every 6 (six) hours as needed for pain.  30 tablet  0  . hydrOXYzine (ATARAX/VISTARIL) 25 MG tablet Take 25 mg by mouth at bedtime.       . insulin detemir (LEVEMIR) 100 UNIT/ML injection Inject 0.1 mLs (10 Units total) into the skin daily with supper.  10 mL  0  . Insulin Syringe-Needle U-100 27G X 1/2" 0.5 ML MISC 1 Syringe by Does not apply route daily.  100 each  0  . letrozole (FEMARA) 2.5 MG tablet Take 2.5 mg by mouth every morning.      . loperamide (IMODIUM) 2 MG capsule       . LORazepam (ATIVAN) 2 MG tablet Take 1 tablet (2 mg total) by mouth  at bedtime.  30 tablet  0  . magnesium oxide (MAG-OX) 400 MG tablet Take 400 mg by mouth daily.      . metFORMIN (GLUCOPHAGE) 1000 MG tablet Take 1 tablet (1,000 mg total) by mouth 2 (two) times daily with a meal.  60 tablet  2  . omeprazole (PRILOSEC) 40 MG capsule Take 1 capsule (40 mg total) by mouth every morning.  30 capsule  1  . ondansetron (ZOFRAN) 8 MG tablet Take 1 tablet (8 mg total) by mouth every 8 (eight) hours as needed for nausea or vomiting.  20 tablet  0  . ONE TOUCH ULTRA TEST test strip       . potassium chloride SA (K-DUR,KLOR-CON) 20 MEQ tablet Take 40 mEq by mouth daily.      Marland Kitchen spironolactone (ALDACTONE) 25 MG tablet Take 25 mg by mouth daily.       No current facility-administered medications for this encounter.    Filed Vitals:   02/13/13 1425  BP: 118/74  Pulse: 87  Weight: 145 lb 8 oz (65.998 kg)  SpO2: 100%   General: NAD Neck: JVP flat, no thyromegaly or thyroid nodule.  Lungs: Clear to auscultation bilaterally with normal respiratory effort. Decreased BS throughout.  CV: Nondisplaced PMI.  Heart regular S1/S2, no S3/S4, no murmur.  No peripheral edema.  No carotid bruit.  Normal pedal pulses.  Abdomen: Soft, mildly tender, no hepatosplenomegaly, no distention.  Skin: Intact without lesions or rashes.  Neurologic: Alert and oriented x 3.  Psych: Normal affect. Extremities: No clubbing or cyanosis.  HEENT: Normal.   Assessment/Plan: 1. Chronic systolic CHF: Nonischemic cardiomyopathy.  Likely Adriamycin toxicity.   --Doing quite well. EF improving slowly by echo. --Volume status stable. NYHA class II. --Will increase nighttime enalapril to 10mg  and see how she tolerates it. She seems to be handling ACE-I ok despite COPD can change to ARB if needed.   2. CAD: Moderate nonobstructive disease.  Continue ASA 81 and statin. LDL 71. Continue atorva.  3. COPD: Still smoking. I have encouraged her to stop  4. Gallbladder disease: she is likely at mild to  moderate risk of peri-operative cardiac problems. But is ok to proceed. Will get echo prior to surgery to have clear picture of where EF stands now.   Follow up in 1 month for additional medication titration. If EF  Remains < 35% will need ICD.   Holly Bodily 02/13/2013

## 2013-02-17 ENCOUNTER — Telehealth: Payer: Self-pay | Admitting: *Deleted

## 2013-02-17 ENCOUNTER — Ambulatory Visit: Payer: Medicare Other | Admitting: Internal Medicine

## 2013-02-17 NOTE — Telephone Encounter (Signed)
Patient requesting pin needles for Levemir.

## 2013-02-18 ENCOUNTER — Other Ambulatory Visit: Payer: Self-pay | Admitting: Nurse Practitioner

## 2013-02-18 DIAGNOSIS — E119 Type 2 diabetes mellitus without complications: Secondary | ICD-10-CM

## 2013-02-18 MED ORDER — INSULIN DETEMIR 100 UNIT/ML FLEXPEN: 10.0000 [IU] | PEN_INJECTOR | Freq: Every day | SUBCUTANEOUS | Status: DC

## 2013-02-18 MED ORDER — INSULIN PEN NEEDLE 32G X 6 MM MISC: 1.0000 | Freq: Every day | Status: DC

## 2013-02-18 NOTE — Telephone Encounter (Signed)
Patient's fasting sugars are 12/16=233, 12/17=277, 12/18=328, and 12/19=239. Patient notified that rx was sent to pharmacy.

## 2013-02-18 NOTE — Telephone Encounter (Signed)
Patient has decided to use the Levemir pens and needs a prescription for the pen needles. Please advise?

## 2013-02-18 NOTE — Telephone Encounter (Signed)
Pt requests flex pen rather than MDV levemir. Script sent.

## 2013-02-23 ENCOUNTER — Other Ambulatory Visit (HOSPITAL_COMMUNITY): Payer: Self-pay | Admitting: Adult Health

## 2013-02-24 ENCOUNTER — Encounter (HOSPITAL_COMMUNITY): Payer: Self-pay

## 2013-02-24 ENCOUNTER — Encounter (HOSPITAL_COMMUNITY)
Admission: RE | Admit: 2013-02-24 | Discharge: 2013-02-24 | Disposition: A | Discharge status: Home / Self Care | Payer: Medicare Other | Source: Ambulatory Visit | Attending: General Surgery | Admitting: General Surgery

## 2013-02-24 ENCOUNTER — Encounter (HOSPITAL_COMMUNITY): Payer: Self-pay | Admitting: Pharmacy Technician

## 2013-02-24 DIAGNOSIS — Z01812 Encounter for preprocedural laboratory examination: Secondary | ICD-10-CM | POA: Insufficient documentation

## 2013-02-24 DIAGNOSIS — Z01818 Encounter for other preprocedural examination: Secondary | ICD-10-CM | POA: Insufficient documentation

## 2013-02-24 HISTORY — DX: Essential (primary) hypertension: I10

## 2013-02-24 HISTORY — DX: Inflammatory liver disease, unspecified: K75.9

## 2013-02-24 LAB — COMPREHENSIVE METABOLIC PANEL
ALT: 20 U/L (ref 0–35)
AST: 19 U/L (ref 0–37)
Albumin: 4 g/dL (ref 3.5–5.2)
Alkaline Phosphatase: 54 U/L (ref 39–117)
CO2: 26 mEq/L (ref 19–32)
Calcium: 9.1 mg/dL (ref 8.4–10.5)
Chloride: 93 mEq/L — ABNORMAL LOW (ref 96–112)
Creatinine, Ser: 0.75 mg/dL (ref 0.50–1.10)
GFR calc Af Amer: >90 mL/min (ref >90)
Potassium: 3.9 mEq/L (ref 3.5–5.1)
Sodium: 135 mEq/L (ref 135–145)
Total Protein: 7.4 g/dL (ref 6.0–8.3)

## 2013-02-24 LAB — CBC WITH DIFFERENTIAL/PLATELET
Basophils Absolute: 0 10*3/uL (ref 0.0–0.1)
Basophils Relative: 0 % (ref 0–1)
Eosinophils Relative: 4 % (ref 0–5)
HCT: 39.5 % (ref 36.0–46.0)
Lymphocytes Relative: 29 % (ref 12–46)
MCHC: 35.7 g/dL (ref 30.0–36.0)
MCV: 82.8 fL (ref 78.0–100.0)
Monocytes Absolute: 0.6 10*3/uL (ref 0.1–1.0)
Neutrophils Relative %: 63 % (ref 43–77)
Platelets: 201 10*3/uL (ref 150–400)
RDW: 14.5 % (ref 11.5–15.5)
WBC: 12.9 10*3/uL — ABNORMAL HIGH (ref 4.0–10.5)

## 2013-02-24 NOTE — Pre-Procedure Instructions (Addendum)
Emily [name]  02/24/2013   Your procedure is scheduled on:  03/06/13  Report to [facility] short stay admitting at 530 AM.  Call this number if you have problems the morning of surgery: [phone]   Remember:   Do not eat food or drink liquids after midnight.   Take these medicines the morning of surgery with A SIP OF WATER: inhaler, pain med if needed, carvedilol,digoxin,femara         STOP all herbel meds, nsaids (aleve,naproxen,advil,ibuprofen) 5 days prior to surgery including mag ox,aspirin, vitamins        NO INSULIN<DIABETIC MEDS AM OF SURGERY   Do not wear jewelry, make-up or nail polish.  Do not wear lotions, powders, or perfumes. You may wear deodorant.  Do not shave 48 hours prior to surgery. Men may shave face and neck.  Do not bring valuables to the hospital.  [facility] is not responsible                  for any belongings or valuables.               Contacts, dentures or bridgework may not be worn into surgery.  Leave suitcase in the car. After surgery it may be brought to your room.  For patients admitted to the hospital, discharge time is determined by your                treatment team.               Patients discharged the day of surgery will not be allowed to drive  home.  Name and phone number of your driver:   Special Instructions: Shower using CHG 2 nights before surgery and the night before surgery.  If you shower the day of surgery use CHG.  Use special wash - you have one bottle of CHG for all showers.  You should use approximately 1/3 of the bottle for each shower.   Please read over the following fact sheets that you were given: Pain Booklet, Coughing and Deep Breathing and Surgical Site Infection Prevention

## 2013-02-25 ENCOUNTER — Telehealth: Payer: Self-pay | Admitting: Nurse Practitioner

## 2013-02-25 ENCOUNTER — Encounter (HOSPITAL_COMMUNITY): Payer: Self-pay

## 2013-02-25 DIAGNOSIS — E119 Type 2 diabetes mellitus without complications: Secondary | ICD-10-CM

## 2013-02-25 MED ORDER — INSULIN DETEMIR 100 UNIT/ML FLEXPEN: 10.0000 [IU] | PEN_INJECTOR | Freq: Two times a day (BID) | SUBCUTANEOUS | Status: DC

## 2013-02-25 NOTE — Telephone Encounter (Signed)
Increase levemir to twice daily-with breakfast and dinner. Continue metformin.

## 2013-02-25 NOTE — Telephone Encounter (Signed)
Patient notified and stated understanding.

## 2013-02-25 NOTE — Progress Notes (Signed)
Anesthesia Chart Review:  Patient is a 53 year old female scheduled for laparoscopic cholecystectomy on 03/06/13 by Dr. Dwain Sarna.    History includes smoking, systolic CHF due to non-ischemic cardiomyopathy (Adriamycin toxicity suspected cause) 09/2012, moderate non-obstructive CAD, COPD, DM2, GERD, acute pancreatitis 11/2012, right breast cancer s/p lumpectomy '09 with chemoradiation, depression, anxiety, iron deficiency anemia, dyslipidemia, CHF, HTN, Hepatitis "E" in the 1970's, tonsillectomy, hysterectomy. PCP is Maximino Sarin, NP with [facility]. Pulmonologist is Dr. Sherene Sires.    Cardiologist is Dr. Gala Romney who felt she was okay to proceed but at mild to moderate CV risk. EF was up to 30-35% from 20-25% by recent echo 12/2012.  Echo on 01/10/13 showed: - Left ventricle: The cavity size was normal. Wall thickness was normal. Systolic function was moderately to severely reduced. The estimated ejection fraction was in the range of 30% to 35%. Diffuse hypokinesis. Doppler parameters are consistent with abnormal left ventricular relaxation (grade 1 diastolic dysfunction). - Aortic valve: There was no stenosis. - Mitral valve: Mildly calcified annulus. Mildly calcified leaflets . No significant regurgitation. - Left atrium: The atrium was mildly dilated. - Right ventricle: The cavity size was normal. Systolic function was normal. - Pulmonary arteries: No complete TR doppler jet so unable to estimate PA systolic pressure. - Systemic veins: IVC measured 1.8 cm with normal respirophasic variation, suggesting RA pressure 6-10 mmHg. Impressions: Normal LV size with EF 30-35%. Diffuse hypokinesis. Normal RV size and systolic function. No significant valvular abnormalities.  10/31/12 LHC showed moderate nonobstructive CAD (40-50% mLAD, 50-70% LCx(OM1), 50% PDA).   ECG 10/26/12 showed ST @ 105, borderline low voltage in frontal leads, diffuse non-specific T wave abnormality.   12/05/12 Carotid Dopplers-  R 39 % stenosis and L 40-59% stenosis.   PFTs on 03/18/12 showed FVC 2.47 (68%), FEV1 1. 93 (68%), DLCO 22.15 (86%).  No significant response to BD.  CXR on 12/19/12 showed no acute cardiopulmonary process.  Preoperative labs noted.  Glucose is 287. She had drank tea and coffee with artifical sweeteners earlier that day   Patient was recently started on Levimir 10 Units Q PM ~ 02/06/13; however, fasting glucose levels still remain ~ 180-300. She is also on metformin 1000 mg BID.  She denies any hypoglycemia or associated symptoms. She was suppose to see a dietician, but there have been two scheduling conflicts and now she can't be seen until January. Her PCP was notified and is increasing Levimir to 10 Units SQ BID in hopes to get her DM better controlled prior to surgery.  I have routed CMET results with this information to Dr. Dwain Sarna.  [facility] Short Stay Center/Anesthesiology Phone [phone] 02/25/2013 6:13 PM

## 2013-02-25 NOTE — Telephone Encounter (Signed)
Message copied by Maximino Sarin [facility] on Tue Feb 25, 2013  5:06 PM ------      Message from: Jerold Coombe      Created: Tue Feb 25, 2013  3:06 PM      Regarding: hyperglycemia       Maximino Sarin,      I'm the PA with anesthesia at [facility].  Emily [name] is for cholecystectomy with Dr. Dwain Sarna on 03/06/13.  Her glucose yesterday was 287.  She had coffee and tea with artificial sweeteners only that day.  I know she was just started on Levimir ~ 02/06/13, but she still reports her fasting sugars are ~ 180-300.  She denied any known hypoglycemia or associated symptoms. She said she can not see the dietician until January.            If she came in for surgery with a fasting ~ 180-200, that wouldn't be a problem, but surgery could be delayed on cancelled if she comes in at 300.  I told [name] that I would contact you.  Please have you or your staff contact her if you have any additional recommendations to help improve her DM control prior to surgery.              I'm in the office this week except Thursday if you need to reach me. I'm also going to forward labs to Dr. Dwain Sarna.            Thanks,      Velna O[facility]      [facility] Short Stay Center/Anesthesiology      Phone [phone] or [phone]      02/25/2013 3:14 PM             ------

## 2013-03-05 ENCOUNTER — Telehealth: Payer: Self-pay | Admitting: *Deleted

## 2013-03-05 DIAGNOSIS — E119 Type 2 diabetes mellitus without complications: Secondary | ICD-10-CM

## 2013-03-05 MED ORDER — INSULIN DETEMIR 100 UNIT/ML FLEXPEN: PEN_INJECTOR | SUBCUTANEOUS | Status: DC

## 2013-03-05 MED ORDER — DEXTROSE 5 % IV SOLN
2.0000 g | INTRAVENOUS | Status: AC
  Administered 2013-03-06: 2 g via INTRAVENOUS
  Filled 2013-03-05: qty 2

## 2013-03-05 NOTE — Telephone Encounter (Signed)
Am BS running bw 153 - 262 PM BS running bw  127- 227 Will increase evening dose to 13 units from 10 units. Continue Metformin 1000mg  bid.

## 2013-03-05 NOTE — Telephone Encounter (Signed)
Patient notified of med changes.  

## 2013-03-05 NOTE — Telephone Encounter (Signed)
Patient called with BS readings. Wednesday 02/26/13 am-160, pm-204. Thursday 02/27/13 am-219, pm-227. Friday 02/28/13 Saturday am-238. 03/01/13 am-153, bedtime-156. Sunday 03/02/13 am-262, pm-213. Monday 03/03/13 am 167, pm-214, bedtime-127. Tuesday 03/04/13 am 237.

## 2013-03-06 ENCOUNTER — Ambulatory Visit (HOSPITAL_COMMUNITY): Payer: Medicare Other | Admitting: Anesthesiology

## 2013-03-06 ENCOUNTER — Encounter (HOSPITAL_COMMUNITY): Payer: Medicare Other | Admitting: Vascular Surgery

## 2013-03-06 ENCOUNTER — Encounter (HOSPITAL_COMMUNITY)
Admission: RE | Disposition: A | Discharge status: Home / Self Care | Payer: Self-pay | Source: Ambulatory Visit | Attending: General Surgery

## 2013-03-06 ENCOUNTER — Inpatient Hospital Stay (HOSPITAL_COMMUNITY)
Admission: RE | Admit: 2013-03-06 | Discharge: 2013-03-09 | DRG: 415 | Disposition: A | Discharge status: Home / Self Care | Payer: Medicare Other | Source: Ambulatory Visit | Attending: General Surgery | Admitting: General Surgery

## 2013-03-06 ENCOUNTER — Encounter (HOSPITAL_COMMUNITY): Payer: Self-pay | Admitting: Surgery

## 2013-03-06 DIAGNOSIS — K8010 Calculus of gallbladder with chronic cholecystitis without obstruction: Secondary | ICD-10-CM

## 2013-03-06 DIAGNOSIS — I5022 Chronic systolic (congestive) heart failure: Secondary | ICD-10-CM | POA: Diagnosis present

## 2013-03-06 DIAGNOSIS — K660 Peritoneal adhesions (postprocedural) (postinfection): Secondary | ICD-10-CM | POA: Diagnosis present

## 2013-03-06 DIAGNOSIS — K859 Acute pancreatitis without necrosis or infection, unspecified: Principal | ICD-10-CM | POA: Diagnosis present

## 2013-03-06 DIAGNOSIS — E785 Hyperlipidemia, unspecified: Secondary | ICD-10-CM | POA: Diagnosis present

## 2013-03-06 DIAGNOSIS — E1142 Type 2 diabetes mellitus with diabetic polyneuropathy: Secondary | ICD-10-CM | POA: Diagnosis present

## 2013-03-06 DIAGNOSIS — Z5331 Laparoscopic surgical procedure converted to open procedure: Secondary | ICD-10-CM

## 2013-03-06 DIAGNOSIS — Z79899 Other long term (current) drug therapy: Secondary | ICD-10-CM

## 2013-03-06 DIAGNOSIS — K7689 Other specified diseases of liver: Secondary | ICD-10-CM | POA: Diagnosis present

## 2013-03-06 DIAGNOSIS — Z9049 Acquired absence of other specified parts of digestive tract: Secondary | ICD-10-CM

## 2013-03-06 DIAGNOSIS — I509 Heart failure, unspecified: Secondary | ICD-10-CM | POA: Diagnosis present

## 2013-03-06 DIAGNOSIS — K219 Gastro-esophageal reflux disease without esophagitis: Secondary | ICD-10-CM | POA: Diagnosis present

## 2013-03-06 DIAGNOSIS — Z853 Personal history of malignant neoplasm of breast: Secondary | ICD-10-CM

## 2013-03-06 DIAGNOSIS — Z87891 Personal history of nicotine dependence: Secondary | ICD-10-CM

## 2013-03-06 DIAGNOSIS — E1149 Type 2 diabetes mellitus with other diabetic neurological complication: Secondary | ICD-10-CM | POA: Diagnosis present

## 2013-03-06 DIAGNOSIS — Z7982 Long term (current) use of aspirin: Secondary | ICD-10-CM

## 2013-03-06 HISTORY — PX: CHOLECYSTECTOMY: SHX55

## 2013-03-06 HISTORY — DX: Complex regional pain syndrome i of unspecified lower limb: G90.529

## 2013-03-06 LAB — GLUCOSE, CAPILLARY
GLUCOSE-CAPILLARY: 144 mg/dL — AB (ref 70–99)
Glucose-Capillary: 182 mg/dL — ABNORMAL HIGH (ref 70–99)
Glucose-Capillary: 239 mg/dL — ABNORMAL HIGH (ref 70–99)
Glucose-Capillary: 249 mg/dL — ABNORMAL HIGH (ref 70–99)
Glucose-Capillary: 153 mg/dL — ABNORMAL HIGH (ref 70–99)

## 2013-03-06 SURGERY — LAPAROSCOPIC CHOLECYSTECTOMY WITH INTRAOPERATIVE CHOLANGIOGRAM
Anesthesia: General | Site: Abdomen

## 2013-03-06 MED ORDER — ROCURONIUM BROMIDE 100 MG/10ML IV SOLN
INTRAVENOUS | Status: DC | PRN
  Administered 2013-03-06: 35 mg via INTRAVENOUS
  Administered 2013-03-06: 15 mg via INTRAVENOUS

## 2013-03-06 MED ORDER — SPIRONOLACTONE 25 MG PO TABS
25.0000 mg | ORAL_TABLET | Freq: Every day | ORAL | Status: DC
  Administered 2013-03-06 – 2013-03-09 (×4): 25 mg via ORAL
  Filled 2013-03-06 (×4): qty 1

## 2013-03-06 MED ORDER — HYDROMORPHONE HCL PF 1 MG/ML IJ SOLN
INTRAMUSCULAR | Status: AC
  Filled 2013-03-06: qty 1

## 2013-03-06 MED ORDER — LACTATED RINGERS IV SOLN
INTRAVENOUS | Status: DC | PRN
  Administered 2013-03-06: 07:00:00 via INTRAVENOUS

## 2013-03-06 MED ORDER — ACETAMINOPHEN 650 MG RE SUPP: 650.0000 mg | Freq: Four times a day (QID) | RECTAL | Status: DC | PRN

## 2013-03-06 MED ORDER — PROPOFOL 10 MG/ML IV BOLUS
INTRAVENOUS | Status: DC | PRN
  Administered 2013-03-06: 90 mg via INTRAVENOUS

## 2013-03-06 MED ORDER — DIGOXIN 125 MCG PO TABS
0.1250 mg | ORAL_TABLET | Freq: Every day | ORAL | Status: DC
  Administered 2013-03-07 – 2013-03-09 (×3): 0.125 mg via ORAL
  Filled 2013-03-06 (×5): qty 1

## 2013-03-06 MED ORDER — GABAPENTIN 400 MG PO CAPS
800.0000 mg | ORAL_CAPSULE | Freq: Every day | ORAL | Status: DC
  Administered 2013-03-06 – 2013-03-08 (×3): 800 mg via ORAL
  Filled 2013-03-06 (×5): qty 2

## 2013-03-06 MED ORDER — NEOSTIGMINE METHYLSULFATE 1 MG/ML IJ SOLN
INTRAMUSCULAR | Status: DC | PRN
  Administered 2013-03-06: 4 mg via INTRAVENOUS

## 2013-03-06 MED ORDER — MIDAZOLAM HCL 5 MG/5ML IJ SOLN
INTRAMUSCULAR | Status: DC | PRN
  Administered 2013-03-06: 2 mg via INTRAVENOUS

## 2013-03-06 MED ORDER — ONDANSETRON HCL 4 MG/2ML IJ SOLN
INTRAMUSCULAR | Status: AC
  Filled 2013-03-06: qty 2

## 2013-03-06 MED ORDER — CARVEDILOL 6.25 MG PO TABS
6.2500 mg | ORAL_TABLET | Freq: Two times a day (BID) | ORAL | Status: DC
  Administered 2013-03-06 – 2013-03-09 (×6): 6.25 mg via ORAL
  Filled 2013-03-06 (×6): qty 1
  Filled 2013-03-06: qty 2
  Filled 2013-03-06 (×2): qty 1

## 2013-03-06 MED ORDER — 0.9 % SODIUM CHLORIDE (POUR BTL) OPTIME
TOPICAL | Status: DC | PRN
  Administered 2013-03-06: 1000 mL

## 2013-03-06 MED ORDER — HYDROCODONE-ACETAMINOPHEN 5-325 MG PO TABS
1.0000 | ORAL_TABLET | Freq: Four times a day (QID) | ORAL | Status: DC | PRN
  Administered 2013-03-06 – 2013-03-08 (×5): 1 via ORAL
  Filled 2013-03-06 (×7): qty 1

## 2013-03-06 MED ORDER — POTASSIUM CHLORIDE CRYS ER 20 MEQ PO TBCR
40.0000 meq | EXTENDED_RELEASE_TABLET | Freq: Every day | ORAL | Status: DC
  Administered 2013-03-07 – 2013-03-09 (×3): 40 meq via ORAL
  Filled 2013-03-06 (×3): qty 2

## 2013-03-06 MED ORDER — OXYCODONE HCL 5 MG PO TABS
5.0000 mg | ORAL_TABLET | Freq: Once | ORAL | Status: AC | PRN
  Administered 2013-03-06: 5 mg via ORAL

## 2013-03-06 MED ORDER — PANTOPRAZOLE SODIUM 40 MG PO TBEC
40.0000 mg | DELAYED_RELEASE_TABLET | Freq: Every day | ORAL | Status: DC
  Administered 2013-03-06 – 2013-03-09 (×4): 40 mg via ORAL
  Filled 2013-03-06 (×2): qty 1

## 2013-03-06 MED ORDER — FUROSEMIDE 40 MG PO TABS
40.0000 mg | ORAL_TABLET | Freq: Every day | ORAL | Status: DC
  Administered 2013-03-06 – 2013-03-09 (×4): 40 mg via ORAL
  Filled 2013-03-06 (×4): qty 1

## 2013-03-06 MED ORDER — ALBUTEROL SULFATE HFA 108 (90 BASE) MCG/ACT IN AERS
1.0000 | INHALATION_SPRAY | Freq: Four times a day (QID) | RESPIRATORY_TRACT | Status: DC | PRN
  Filled 2013-03-06: qty 6.7

## 2013-03-06 MED ORDER — OXYCODONE HCL 5 MG PO TABS
ORAL_TABLET | ORAL | Status: AC
  Filled 2013-03-06: qty 1

## 2013-03-06 MED ORDER — GABAPENTIN 800 MG PO TABS
800.0000 mg | ORAL_TABLET | Freq: Every day | ORAL | Status: DC
  Filled 2013-03-06: qty 1

## 2013-03-06 MED ORDER — FENTANYL CITRATE 0.05 MG/ML IJ SOLN
INTRAMUSCULAR | Status: DC | PRN
  Administered 2013-03-06 (×3): 50 ug via INTRAVENOUS

## 2013-03-06 MED ORDER — HYDROMORPHONE HCL PF 1 MG/ML IJ SOLN
0.2500 mg | INTRAMUSCULAR | Status: DC | PRN
  Administered 2013-03-06 (×4): 0.5 mg via INTRAVENOUS

## 2013-03-06 MED ORDER — SODIUM CHLORIDE 0.9 % IV SOLN
INTRAVENOUS | Status: DC
  Administered 2013-03-06 – 2013-03-07 (×2): via INTRAVENOUS

## 2013-03-06 MED ORDER — ARTIFICIAL TEARS OP OINT
TOPICAL_OINTMENT | OPHTHALMIC | Status: DC | PRN
  Administered 2013-03-06: 1 via OPHTHALMIC

## 2013-03-06 MED ORDER — ENALAPRIL MALEATE 5 MG PO TABS
5.0000 mg | ORAL_TABLET | Freq: Every day | ORAL | Status: DC
  Administered 2013-03-07 – 2013-03-09 (×3): 5 mg via ORAL
  Filled 2013-03-06 (×4): qty 1

## 2013-03-06 MED ORDER — GLYCOPYRROLATE 0.2 MG/ML IJ SOLN
INTRAMUSCULAR | Status: DC | PRN
  Administered 2013-03-06: 0.6 mg via INTRAVENOUS

## 2013-03-06 MED ORDER — LIDOCAINE HCL (CARDIAC) 20 MG/ML IV SOLN
INTRAVENOUS | Status: DC | PRN
Start: 2013-03-06 — End: 2013-03-06
  Administered 2013-03-06: 60 mg via INTRAVENOUS

## 2013-03-06 MED ORDER — OXYCODONE HCL 5 MG/5ML PO SOLN: 5.0000 mg | Freq: Once | ORAL | Status: AC | PRN

## 2013-03-06 MED ORDER — ENALAPRIL MALEATE 5 MG PO TABS: 5.0000 mg | ORAL_TABLET | Freq: Two times a day (BID) | ORAL | Status: DC

## 2013-03-06 MED ORDER — DIPHENHYDRAMINE HCL 50 MG/ML IJ SOLN
12.5000 mg | Freq: Four times a day (QID) | INTRAMUSCULAR | Status: DC | PRN
  Administered 2013-03-06 – 2013-03-09 (×3): 12.5 mg via INTRAVENOUS
  Filled 2013-03-06 (×3): qty 1

## 2013-03-06 MED ORDER — PHENYLEPHRINE HCL 10 MG/ML IJ SOLN
INTRAMUSCULAR | Status: DC | PRN
  Administered 2013-03-06 (×2): 80 ug via INTRAVENOUS

## 2013-03-06 MED ORDER — INSULIN ASPART 100 UNIT/ML ~~LOC~~ SOLN
0.0000 [IU] | Freq: Three times a day (TID) | SUBCUTANEOUS | Status: DC
Start: 2013-03-06 — End: 2013-03-09
  Administered 2013-03-06 – 2013-03-07 (×4): 5 [IU] via SUBCUTANEOUS
  Administered 2013-03-07 – 2013-03-08 (×2): 8 [IU] via SUBCUTANEOUS
  Administered 2013-03-08 – 2013-03-09 (×4): 3 [IU] via SUBCUTANEOUS

## 2013-03-06 MED ORDER — BUPIVACAINE-EPINEPHRINE 0.25% -1:200000 IJ SOLN
INTRAMUSCULAR | Status: DC | PRN
  Administered 2013-03-06: 10 mL

## 2013-03-06 MED ORDER — HYDROXYZINE HCL 25 MG PO TABS
25.0000 mg | ORAL_TABLET | Freq: Once | ORAL | Status: AC
  Administered 2013-03-06: 25 mg via ORAL
  Filled 2013-03-06: qty 1

## 2013-03-06 MED ORDER — IOHEXOL 300 MG/ML  SOLN
INTRAMUSCULAR | Status: DC | PRN
  Administered 2013-03-06: 08:00:00

## 2013-03-06 MED ORDER — LORAZEPAM 1 MG PO TABS
2.0000 mg | ORAL_TABLET | Freq: Every day | ORAL | Status: DC
  Administered 2013-03-06 – 2013-03-08 (×3): 2 mg via ORAL
  Filled 2013-03-06 (×3): qty 2

## 2013-03-06 MED ORDER — ONDANSETRON HCL 4 MG/2ML IJ SOLN
INTRAMUSCULAR | Status: DC | PRN
  Administered 2013-03-06: 4 mg via INTRAVENOUS

## 2013-03-06 MED ORDER — SODIUM CHLORIDE 0.9 % IR SOLN
Status: DC | PRN
  Administered 2013-03-06: 1000 mL

## 2013-03-06 MED ORDER — ACETAMINOPHEN 325 MG PO TABS
650.0000 mg | ORAL_TABLET | Freq: Four times a day (QID) | ORAL | Status: DC | PRN
  Administered 2013-03-07: 650 mg via ORAL
  Filled 2013-03-06: qty 2

## 2013-03-06 MED ORDER — HEPARIN SODIUM (PORCINE) 5000 UNIT/ML IJ SOLN
5000.0000 [IU] | Freq: Three times a day (TID) | INTRAMUSCULAR | Status: DC
  Administered 2013-03-07 – 2013-03-09 (×7): 5000 [IU] via SUBCUTANEOUS
  Filled 2013-03-06 (×12): qty 1

## 2013-03-06 MED ORDER — ONDANSETRON HCL 4 MG/2ML IJ SOLN
4.0000 mg | Freq: Four times a day (QID) | INTRAMUSCULAR | Status: DC | PRN
  Administered 2013-03-06 (×2): 4 mg via INTRAVENOUS
  Filled 2013-03-06: qty 2

## 2013-03-06 MED ORDER — INSULIN ASPART 100 UNIT/ML ~~LOC~~ SOLN
0.0000 [IU] | Freq: Every day | SUBCUTANEOUS | Status: DC
  Administered 2013-03-07: 2 [IU] via SUBCUTANEOUS
  Filled 2013-03-06: qty 0.05

## 2013-03-06 MED ORDER — MAGNESIUM OXIDE 400 MG PO TABS
400.0000 mg | ORAL_TABLET | Freq: Every day | ORAL | Status: DC
  Administered 2013-03-07 – 2013-03-09 (×3): 400 mg via ORAL
  Filled 2013-03-06 (×4): qty 1

## 2013-03-06 MED ORDER — MORPHINE SULFATE 2 MG/ML IJ SOLN
2.0000 mg | INTRAMUSCULAR | Status: DC | PRN
  Administered 2013-03-06 – 2013-03-08 (×13): 2 mg via INTRAVENOUS
  Filled 2013-03-06 (×16): qty 1

## 2013-03-06 MED ORDER — ENALAPRIL MALEATE 10 MG PO TABS
10.0000 mg | ORAL_TABLET | Freq: Every day | ORAL | Status: DC
  Administered 2013-03-06 – 2013-03-08 (×3): 10 mg via ORAL
  Filled 2013-03-06 (×5): qty 1

## 2013-03-06 MED ORDER — BUPIVACAINE-EPINEPHRINE (PF) 0.25% -1:200000 IJ SOLN
INTRAMUSCULAR | Status: AC
  Filled 2013-03-06: qty 30

## 2013-03-06 SURGICAL SUPPLY — 57 items
ADH SKN CLS APL DERMABOND .7 (GAUZE/BANDAGES/DRESSINGS) ×1
APPLIER CLIP 5 13 M/L LIGAMAX5 (MISCELLANEOUS) ×2
APR CLP MED LRG 5 ANG JAW (MISCELLANEOUS) ×1
BAG SPEC RTRVL 10 TROC 200 (ENDOMECHANICALS) ×1
BLADE SURG 10 STRL SS (BLADE) ×2 IMPLANT
BLADE SURG ROTATE 9660 (MISCELLANEOUS) IMPLANT
CANISTER SUCTION 2500CC (MISCELLANEOUS) ×1 IMPLANT
CHLORAPREP W/TINT 26ML (MISCELLANEOUS) ×1 IMPLANT
CLIP APPLIE 5 13 M/L LIGAMAX5 (MISCELLANEOUS) IMPLANT
COVER MAYO STAND STRL (DRAPES) ×1 IMPLANT
COVER SURGICAL LIGHT HANDLE (MISCELLANEOUS) ×1 IMPLANT
DECANTER SPIKE VIAL GLASS SM (MISCELLANEOUS) ×1 IMPLANT
DERMABOND ADVANCED (GAUZE/BANDAGES/DRESSINGS) ×1
DERMABOND ADVANCED .7 DNX12 (GAUZE/BANDAGES/DRESSINGS) IMPLANT
DRAPE C-ARM 42X72 X-RAY (DRAPES) ×1 IMPLANT
ELECT BLADE 6.5 EXT (BLADE) ×1 IMPLANT
ELECT CAUTERY BLADE 6.4 (BLADE) ×1 IMPLANT
ELECT REM PT RETURN 9FT ADLT (ELECTROSURGICAL) ×2
ELECTRODE REM PT RTRN 9FT ADLT (ELECTROSURGICAL) IMPLANT
GLOVE BIO SURGEON STRL SZ 6.5 (GLOVE) ×1 IMPLANT
GLOVE BIO SURGEON STRL SZ7 (GLOVE) ×1 IMPLANT
GLOVE BIO SURGEON STRL SZ7.5 (GLOVE) ×2 IMPLANT
GLOVE BIOGEL PI IND STRL 7.0 (GLOVE) IMPLANT
GLOVE BIOGEL PI IND STRL 7.5 (GLOVE) IMPLANT
GLOVE BIOGEL PI INDICATOR 7.0 (GLOVE) ×1
GLOVE BIOGEL PI INDICATOR 7.5 (GLOVE) ×4
GLOVE SURG SS PI 7.0 STRL IVOR (GLOVE) ×1 IMPLANT
GOWN STRL NON-REIN LRG LVL3 (GOWN DISPOSABLE) ×8 IMPLANT
KIT BASIN OR (CUSTOM PROCEDURE TRAY) ×1 IMPLANT
KIT ROOM TURNOVER OR (KITS) ×1 IMPLANT
NS IRRIG 1000ML POUR BTL (IV SOLUTION) ×1 IMPLANT
PAD ARMBOARD 7.5X6 YLW CONV (MISCELLANEOUS) ×1 IMPLANT
PENCIL BUTTON HOLSTER BLD 10FT (ELECTRODE) ×1 IMPLANT
POUCH RETRIEVAL ECOSAC 10 (ENDOMECHANICALS) IMPLANT
POUCH RETRIEVAL ECOSAC 10MM (ENDOMECHANICALS) ×1
SCISSORS LAP 5X35 DISP (ENDOMECHANICALS) ×1 IMPLANT
SET CHOLANGIOGRAPH 5 50 .035 (SET/KITS/TRAYS/PACK) ×1 IMPLANT
SET IRRIG TUBING LAPAROSCOPIC (IRRIGATION / IRRIGATOR) ×1 IMPLANT
SLEEVE ENDOPATH XCEL 5M (ENDOMECHANICALS) ×1 IMPLANT
SPECIMEN JAR SMALL (MISCELLANEOUS) ×1 IMPLANT
SPONGE GAUZE 4X4 12PLY (GAUZE/BANDAGES/DRESSINGS) ×1 IMPLANT
SPONGE LAP 18X18 X RAY DECT (DISPOSABLE) ×1 IMPLANT
STAPLER VISISTAT 35W (STAPLE) ×2 IMPLANT
SUCTION POOLE TIP (SUCTIONS) ×1 IMPLANT
SUT MNCRL AB 4-0 PS2 18 (SUTURE) ×1 IMPLANT
SUT PDS AB 1 CTX 36 (SUTURE) ×1 IMPLANT
SUT VIC AB 0 CT1 27 (SUTURE) ×4
SUT VIC AB 0 CT1 27XBRD ANBCTR (SUTURE) IMPLANT
SUT VICRYL 0 UR6 27IN ABS (SUTURE) ×1 IMPLANT
SYR BULB IRRIGATION 50ML (SYRINGE) ×1 IMPLANT
TOWEL OR 17X24 6PK STRL BLUE (TOWEL DISPOSABLE) ×1 IMPLANT
TOWEL OR 17X26 10 PK STRL BLUE (TOWEL DISPOSABLE) ×1 IMPLANT
TRAY LAPAROSCOPIC (CUSTOM PROCEDURE TRAY) ×1 IMPLANT
TROCAR XCEL BLUNT TIP 100MML (ENDOMECHANICALS) ×1 IMPLANT
TROCAR XCEL NON-BLD 5MMX100MML (ENDOMECHANICALS) ×1 IMPLANT
TUBE CONNECTING 12X1/4 (SUCTIONS) ×2 IMPLANT
YANKAUER SUCT BULB TIP NO VENT (SUCTIONS) ×1 IMPLANT

## 2013-03-06 NOTE — Anesthesia Preprocedure Evaluation (Addendum)
Anesthesia Evaluation  Patient identified by MRN, date of birth, ID band Patient awake    Reviewed: Allergy & Precautions, H&P , NPO status , Patient's Chart, lab work & pertinent test results, reviewed documented beta blocker date and time   Airway Mallampati: II TM Distance: >3 FB Neck ROM: Full    Dental no notable dental hx. (+) Teeth Intact and Dental Advisory Given   Pulmonary Current Smoker,  breath sounds clear to auscultation  Pulmonary exam normal       Cardiovascular hypertension, On Medications and On Home Beta Blockers + CAD and +CHF Rhythm:Regular Rate:Normal     Neuro/Psych PSYCHIATRIC DISORDERS negative neurological ROS     GI/Hepatic GERD-  Medicated and Controlled,(+) Hepatitis -  Endo/Other  diabetes, Type 1, Insulin Dependent  Renal/GU negative Renal ROS  negative genitourinary   Musculoskeletal   Abdominal   Peds  Hematology negative hematology ROS (+)   Anesthesia Other Findings   Reproductive/Obstetrics negative OB ROS                          Anesthesia Physical Anesthesia Plan  ASA: III  Anesthesia Plan: General   Post-op Pain Management:    Induction: Intravenous  Airway Management Planned: Oral ETT  Additional Equipment:   Intra-op Plan:   Post-operative Plan: Extubation in OR  Informed Consent: I have reviewed the patients History and Physical, chart, labs and discussed the procedure including the risks, benefits and alternatives for the proposed anesthesia with the patient or authorized representative who has indicated his/her understanding and acceptance.   Dental advisory given  Plan Discussed with: CRNA  Anesthesia Plan Comments:         Anesthesia Quick Evaluation

## 2013-03-06 NOTE — Preoperative (Signed)
Beta Blockers   Reason not to administer Beta Blockers:Not Applicable 

## 2013-03-06 NOTE — Op Note (Signed)
Preoperative diagnosis: history pancreatitis Postoperative diagnosis: significant abdominal adhesions, same as above Procedure: attempted lap chole, open cholecystectomy Surgeon: Dr. Serita Grammes Anesthesia: Gen. Estimated blood loss: Minimal Complications: None Drains: None Specimens: Gallbladder and contents to pathology Sponge and needle count was correct at the completion of the operation prior to closure Disposition to recovery stable  Indications: This is a 53 year old female with multiple cardiac comorbidities who presents with a history of pancreatitis. She does have an abnormal gallbladder ultrasound but is without stones. She also has appeared to have had some debris in her duct on MRCP in the past. She is continues to have episodes of pancreatitis as well as some upper abdominal pain so we discussed a cholecystectomy.  Procedure: After informed consent was obtained the patient was taken to the operating room. She was placed under general anesthesia after receiving cefoxitin. Sequential compression devices were on her legs. Her abdomen was then prepped and draped in the standard sterile surgical fashion. A surgical timeout was then performed.  I infiltrated Marcaine the umbilicus. I made an infraumbilical vertical incision. I identified her fascia and grasped it. I entered this sharply. I then identified the peritoneum and enter this with Metzenbaum scissors. I then placed a 0 Vicryl pursestring suture through the fascia. Her prior surgeries had only been through a low transverse incision. I inserted the 30 scope and realized that her omentum was adherent to her abdominal wall from the upper abdomen all the way to her pelvis. There were numerous adhesions. I looked very carefully for an evidence of an entry injury of which there were none. I did this several times. I tried to find a window in the omentum or place rowdy it were taken down. I inserted a 5 mm trocar in the left lower  quadrant that went through a window in the omentum. I was able to create a window to look up towards the right upper quadrant. I was unable to go to identify either the liver or the gallbladder through this incision. To get the omentum down I was going to have to lyse the adhesions as well as a fair amount of adhesions to her bowel. By the appearance of these lesions they were not flimsy at all either. I elected at this point to do this in an open fashion due to the risk of injury in my opinion. I then removed my trocars. I again inspected for injury prior to this. I then closed my umbilical stitch and this completely obliterated the defect. I then made a right upper quadrant incision. I went through the fascia and divided the muscle. I then entered the peritoneum. I spent another 20 minutes lysing adhesions just to be able to see the gallbladder and the liver in this position. I think her adhesions are likely from her pancreatitis and not from her prior surgery. Her gallbladder was fairly normal although it was diminutive and white appearing. I eventually grasped this. I used cautery to dissect it from the liver bed. I then used dissection to identify the cystic duct as well as the artery at the base. These were clipped and divided. The gallbladder was then removed as a specimen. Hemostasis was obtained. I then irrigated this. I then closed her peritoneum with 0 Vicryl. The fascia was closed with #1 PDS. I irrigated this. I then closed the skin of all incisions with staples. She tolerated this well was extubated and transferred to recovery stable.
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
## 2013-03-06 NOTE — Anesthesia Postprocedure Evaluation (Signed)
  Anesthesia Post-op Note  Patient: Emily [name]  Procedure(s) Performed: Procedure(s): ATTEMPTED LAPAROSCOPIC CHOLECYSTECTOMY (N/A) OPEN CHOLECYSTECTOMY (N/A)  Patient Location: PACU  Anesthesia Type:General  Level of Consciousness: awake and alert   Airway and Oxygen Therapy: Patient Spontanous Breathing  Post-op Pain: mild  Post-op Assessment: Post-op Vital signs reviewed, Patient's Cardiovascular Status Stable and Respiratory Function Stable  Post-op Vital Signs: Reviewed  Filed Vitals:   03/06/13 0945  BP:   Pulse: 74  Temp:   Resp: 15    Complications: No apparent anesthesia complications

## 2013-03-06 NOTE — Anesthesia Procedure Notes (Signed)
Procedure Name: Intubation Date/Time: 03/06/2013 7:33 AM Performed by: Scheryl Darter Pre-anesthesia Checklist: Patient identified, Emergency Drugs available and Timeout performed Patient Re-evaluated:Patient Re-evaluated prior to inductionOxygen Delivery Method: Circle system utilized Preoxygenation: Pre-oxygenation with 100% oxygen Intubation Type: IV induction Ventilation: Mask ventilation without difficulty Laryngoscope Size: Miller and 2 Grade View: Grade I Tube type: Oral Tube size: 7.5 mm Number of attempts: 1 Airway Equipment and Method: Stylet Placement Confirmation: ETT inserted through vocal cords under direct vision,  positive ETCO2 and breath sounds checked- equal and bilateral Secured at: 22 cm Tube secured with: Tape Dental Injury: Teeth and Oropharynx as per pre-operative assessment

## 2013-03-06 NOTE — Transfer of Care (Signed)
Immediate Anesthesia Transfer of Care Note  Patient: Emily [name]  Procedure(s) Performed: Procedure(s): ATTEMPTED LAPAROSCOPIC CHOLECYSTECTOMY (N/A) OPEN CHOLECYSTECTOMY (N/A)  Patient Location: PACU  Anesthesia Type:General  Level of Consciousness: awake, alert , oriented and sedated  Airway & Oxygen Therapy: Patient Spontanous Breathing and Patient connected to face mask oxygen  Post-op Assessment: Report given to PACU RN, Post -op Vital signs reviewed and stable and Patient moving all extremities  Post vital signs: Reviewed and stable  Complications: No apparent anesthesia complications

## 2013-03-07 ENCOUNTER — Encounter (HOSPITAL_COMMUNITY): Payer: Self-pay | Admitting: General Practice

## 2013-03-07 LAB — CBC
HEMATOCRIT: 40.5 % (ref 36.0–46.0)
Hemoglobin: 13.4 g/dL (ref 12.0–15.0)
MCH: 29.5 pg (ref 26.0–34.0)
MCHC: 33.1 g/dL (ref 30.0–36.0)
MCV: 89.2 fL (ref 78.0–100.0)
Platelets: 173 10*3/uL (ref 150–400)
RBC: 4.54 MIL/uL (ref 3.87–5.11)
RDW: 14.3 % (ref 11.5–15.5)
WBC: 14.3 10*3/uL — AB (ref 4.0–10.5)

## 2013-03-07 LAB — BASIC METABOLIC PANEL
BUN: 13 mg/dL (ref 6–23)
CALCIUM: 8.2 mg/dL — AB (ref 8.4–10.5)
CHLORIDE: 98 meq/L (ref 96–112)
CO2: 28 meq/L (ref 19–32)
CREATININE: 0.77 mg/dL (ref 0.50–1.10)
GFR calc Af Amer: >90 mL/min (ref >90)
GFR calc non Af Amer: >90 mL/min (ref >90)
GLUCOSE: 209 mg/dL — AB (ref 70–99)
Potassium: 3.7 mEq/L (ref 3.7–5.3)
Sodium: 139 mEq/L (ref 137–147)

## 2013-03-07 LAB — GLUCOSE, CAPILLARY
GLUCOSE-CAPILLARY: 253 mg/dL — AB (ref 70–99)
GLUCOSE-CAPILLARY: 240 mg/dL — AB (ref 70–99)
Glucose-Capillary: 209 mg/dL — ABNORMAL HIGH (ref 70–99)
Glucose-Capillary: 213 mg/dL — ABNORMAL HIGH (ref 70–99)

## 2013-03-07 MED ORDER — KCL IN DEXTROSE-NACL 20-5-0.45 MEQ/L-%-% IV SOLN
INTRAVENOUS | Status: DC
  Administered 2013-03-07: 10:00:00 via INTRAVENOUS
  Administered 2013-03-08: 50 mL/h via INTRAVENOUS
  Administered 2013-03-09: 05:00:00 via INTRAVENOUS
  Filled 2013-03-07 (×4): qty 1000

## 2013-03-07 MED ORDER — NICOTINE 14 MG/24HR TD PT24
14.0000 mg | MEDICATED_PATCH | Freq: Every day | TRANSDERMAL | Status: DC
  Administered 2013-03-07 – 2013-03-09 (×3): 14 mg via TRANSDERMAL
  Filled 2013-03-07 (×3): qty 1

## 2013-03-07 MED ORDER — ALBUTEROL SULFATE (2.5 MG/3ML) 0.083% IN NEBU
2.5000 mg | INHALATION_SOLUTION | Freq: Four times a day (QID) | RESPIRATORY_TRACT | Status: DC | PRN
  Administered 2013-03-07: 2.5 mg via RESPIRATORY_TRACT
  Filled 2013-03-07: qty 3

## 2013-03-07 NOTE — Progress Notes (Addendum)
Inpatient Diabetes Program Recommendations  AACE/ADA: New Consensus Statement on Inpatient Glycemic Control (2013)  Target Ranges:  Prepandial:   less than 140 mg/dL      Peak postprandial:   less than 180 mg/dL (1-2 hours)      Critically ill patients:  140 - 180 mg/dL     Results for Emily [name], Emily [name] (MRN [id number]) as of 03/07/2013 10:04  Ref. Range 03/06/2013 09:10 03/06/2013 11:56 03/06/2013 16:07 03/06/2013 22:12  Glucose-Capillary Latest Range: 70-99 mg/dL 182 (H) 239 (H) 249 (H) 153 (H)    Results for Emily [name], Emily [name] (MRN [id number]) as of 03/07/2013 10:04  Ref. Range 03/07/2013 08:23  Glucose-Capillary Latest Range: 70-99 mg/dL 209 (H)    **Home DM medications include: Levemir 10 units in the AM and 13 units in the PM Metformin 1000 mg bid   **MD- Please restart patient's home Levemir insulin   Will follow. Wyn Quaker RN, MSN, CDE Diabetes Coordinator Inpatient Diabetes Program Team Pager: [phone] (8a-10p)

## 2013-03-07 NOTE — Progress Notes (Signed)
1 Day Post-Op  Subjective: Sore, tol clears fine, no flatus yet  Objective: Vital signs in last 24 hours: Temp:  [98 F (36.7 C)-100.2 F (37.9 C)] 100.2 F (37.9 C) (01/09 0512) Pulse Rate:  [74-127] 127 (01/09 0512) Resp:  [15-22] 20 (01/09 0512) BP: (100-137)/(53-70) 104/62 mmHg (01/09 0512) SpO2:  [90 %-98 %] 91 % (01/09 0512) Last BM Date: 03/06/13  Intake/Output from previous day: 01/08 0701 - 01/09 0700 In: 2763.8 [P.O.:440; I.V.:2323.8] Out: 2465 [Urine:2450; Blood:15] Intake/Output this shift:    General appearance: no distress Resp: clear to auscultation bilaterally Cardio: regular rate and rhythm GI: dressings dry, approp tender  Lab Results:   Recent Labs  03/07/13 0618  WBC 14.3*  HGB 13.4  HCT 40.5  PLT 173   BMET No results found for this basename: NA, K, CL, CO2, GLUCOSE, BUN, CREATININE, CALCIUM,  in the last 72 hours PT/INR No results found for this basename: LABPROT, INR,  in the last 72 hours ABG No results found for this basename: PHART, PCO2, PO2, HCO3,  in the last 72 hours  Studies/Results: No results found.  Anti-infectives: Anti-infectives   Start     Dose/Rate Route Frequency Ordered Stop   03/06/13 0600  cefOXitin (MEFOXIN) 2 g in dextrose 5 % 50 mL IVPB     2 g 100 mL/hr over 30 Minutes Intravenous On call to O.R. 03/05/13 1255 03/06/13 0735      Assessment/Plan: POD 1 open chole Cont pain meds, will need to stay at least another day until pain controlled orall Can have regular diet today pulm toilet oob today Sq heparin, scds All regular cardiac meds   [facility] 03/07/2013

## 2013-03-08 LAB — GLUCOSE, CAPILLARY
GLUCOSE-CAPILLARY: 168 mg/dL — AB (ref 70–99)
GLUCOSE-CAPILLARY: 177 mg/dL — AB (ref 70–99)
GLUCOSE-CAPILLARY: 247 mg/dL — AB (ref 70–99)
Glucose-Capillary: 170 mg/dL — ABNORMAL HIGH (ref 70–99)

## 2013-03-08 MED ORDER — HYDROCODONE-ACETAMINOPHEN 5-325 MG PO TABS
1.0000 | ORAL_TABLET | ORAL | Status: DC | PRN
  Administered 2013-03-08 – 2013-03-09 (×5): 2 via ORAL
  Administered 2013-03-09: 1 via ORAL
  Administered 2013-03-09: 2 via ORAL
  Filled 2013-03-08 (×4): qty 2
  Filled 2013-03-08: qty 1
  Filled 2013-03-08 (×2): qty 2

## 2013-03-08 MED ORDER — INSULIN GLARGINE 100 UNIT/ML ~~LOC~~ SOLN
10.0000 [IU] | Freq: Every day | SUBCUTANEOUS | Status: DC
  Administered 2013-03-08 – 2013-03-09 (×2): 10 [IU] via SUBCUTANEOUS
  Filled 2013-03-08 (×4): qty 0.1

## 2013-03-08 NOTE — Progress Notes (Signed)
Patient ID: Emily [name], female   DOB: Feb 15, 1961, 53 y.o.   MRN: [id number] 2 Days Post-Op  Subjective: Main complaint is right shoulder pain which was very severe last night. Her abdomen feels better. Tolerating solid food. No nausea.  Objective: Vital signs in last 24 hours: Temp:  [97.7 F (36.5 C)-98.9 F (37.2 C)] 97.7 F (36.5 C) (01/10 0523) Pulse Rate:  [87-100] 94 (01/10 0523) Resp:  [18] 18 (01/10 0523) BP: (87-121)/(45-62) 90/46 mmHg (01/10 0523) SpO2:  [93 %-94 %] 94 % (01/10 0523) Last BM Date: 03/06/13  Intake/Output from previous day: 01/09 0701 - 01/10 0700 In: 592.5 [P.O.:360; I.V.:232.5] Out: 1800 [Urine:1800] Intake/Output this shift:    General appearance: alert, cooperative and no distress GI: soft with minimal appropriate incisional tenderness Extremities: good range of motion of the right shoulder without pain and no swelling or erythema or tenderness Incision/Wound: dressings intact and clean and dry  Lab Results:   Recent Labs  03/07/13 0618  WBC 14.3*  HGB 13.4  HCT 40.5  PLT 173   BMET  Recent Labs  03/07/13 0618  NA 139  K 3.7  CL 98  CO2 28  GLUCOSE 209*  BUN 13  CREATININE 0.77  CALCIUM 8.2*   CBG (last 3)   Recent Labs  03/07/13 1209 03/07/13 1657 03/07/13 2152  GLUCAP 253* 240* 213*      Studies/Results: No results found.  Anti-infectives: Anti-infectives   Start     Dose/Rate Route Frequency Ordered Stop   03/06/13 0600  cefOXitin (MEFOXIN) 2 g in dextrose 5 % 50 mL IVPB     2 g 100 mL/hr over 30 Minutes Intravenous On call to O.R. 03/05/13 1255 03/06/13 0735      Assessment/Plan: s/p Procedure(s): ATTEMPTED LAPAROSCOPIC CHOLECYSTECTOMY OPEN CHOLECYSTECTOMY Overall appears stable Right shoulder pain is major complaint. Her joint appears normal. Possibly referred pain from diaphragmatic irritation. Hyperglycemia. Adjust sliding scale insulin. She does not feel able to go home today do to her  shoulder pain. Observe today.    LOS: 2 days    Gissella Niblack T 03/08/2013

## 2013-03-09 ENCOUNTER — Inpatient Hospital Stay (HOSPITAL_COMMUNITY): Payer: Medicare Other

## 2013-03-09 LAB — CBC
HCT: 35.9 % — ABNORMAL LOW (ref 36.0–46.0)
Hemoglobin: 12 g/dL (ref 12.0–15.0)
MCH: 29.2 pg (ref 26.0–34.0)
MCHC: 33.4 g/dL (ref 30.0–36.0)
MCV: 87.3 fL (ref 78.0–100.0)
PLATELETS: 131 10*3/uL — AB (ref 150–400)
RBC: 4.11 MIL/uL (ref 3.87–5.11)
RDW: 13.5 % (ref 11.5–15.5)
WBC: 6.9 10*3/uL (ref 4.0–10.5)

## 2013-03-09 LAB — GLUCOSE, CAPILLARY
Glucose-Capillary: 171 mg/dL — ABNORMAL HIGH (ref 70–99)
Glucose-Capillary: 174 mg/dL — ABNORMAL HIGH (ref 70–99)

## 2013-03-09 LAB — BASIC METABOLIC PANEL
BUN: 7 mg/dL (ref 6–23)
CALCIUM: 9 mg/dL (ref 8.4–10.5)
CO2: 30 mEq/L (ref 19–32)
Chloride: 99 mEq/L (ref 96–112)
Creatinine, Ser: 0.63 mg/dL (ref 0.50–1.10)
GFR calc Af Amer: >90 mL/min (ref >90)
GFR calc non Af Amer: >90 mL/min (ref >90)
GLUCOSE: 202 mg/dL — AB (ref 70–99)
Potassium: 4.7 mEq/L (ref 3.7–5.3)
SODIUM: 141 meq/L (ref 137–147)

## 2013-03-09 MED ORDER — HYDROCODONE-ACETAMINOPHEN 5-325 MG PO TABS: 1.0000 | ORAL_TABLET | ORAL | Status: DC | PRN

## 2013-03-09 MED ORDER — ONDANSETRON HCL 4 MG PO TABS
4.0000 mg | ORAL_TABLET | Freq: Once | ORAL | Status: AC
  Administered 2013-03-09: 4 mg via ORAL
  Filled 2013-03-09: qty 1

## 2013-03-09 NOTE — Discharge Instructions (Signed)
[facility]      [facility] [phone]  OPEN ABDOMINAL SURGERY: POST OP INSTRUCTIONS  Always review your discharge instruction sheet given to you by the facility where your surgery was performed.  IF YOU HAVE DISABILITY OR FAMILY LEAVE FORMS, YOU MUST BRING THEM TO THE OFFICE FOR PROCESSING.  PLEASE DO NOT GIVE THEM TO YOUR DOCTOR.  1. A prescription for pain medication may be given to you upon discharge.  Take your pain medication as prescribed, if needed.  If narcotic pain medicine is not needed, then you may take acetaminophen (Tylenol) or ibuprofen (Advil) as needed. 2. Take your usually prescribed medications unless otherwise directed. 3. If you need a refill on your pain medication, please contact your pharmacy. They will contact our office to request authorization.  Prescriptions will not be filled after 5pm or on week-ends. 4. You should follow a light diet the first few days after arrival home, such as soup and crackers, pudding, etc.unless your doctor has advised otherwise. A high-fiber, low fat diet can be resumed as tolerated.   Be sure to include lots of fluids daily. Most patients will experience some swelling and bruising on the chest and neck area.  Ice packs will help.  Swelling and bruising can take several days to resolve 5. Most patients will experience some swelling and bruising in the area of the incision. Ice pack will help. Swelling and bruising can take several days to resolve..  6. It is common to experience some constipation if taking pain medication after surgery.  Increasing fluid intake and taking a stool softener will usually help or prevent this problem from occurring.  A mild laxative (Milk of Magnesia or Miralax) should be taken according to package directions if there are no bowel movements after 48 hours. 7.  You may have steri-strips (small skin tapes) in place directly over the incision.  These strips should be left on the skin for 7-10 days.  If your  surgeon used skin glue on the incision, you may shower in 24 hours.  The glue will flake off over the next 2-3 weeks.  Any sutures or staples will be removed at the office during your follow-up visit. You may find that a light gauze bandage over your incision may keep your staples from being rubbed or pulled. You may shower and replace the bandage daily. 8. ACTIVITIES:  You may resume regular (light) daily activities beginning the next day--such as daily self-care, walking, climbing stairs--gradually increasing activities as tolerated.  You may have sexual intercourse when it is comfortable.  Refrain from any heavy lifting or straining until approved by your doctor. a. You may drive when you no longer are taking prescription pain medication, you can comfortably wear a seatbelt, and you can safely maneuver your car and apply brakes b. Return to Work: ___________________________________ 9. You should see your doctor in the office for a follow-up appointment approximately two weeks after your surgery.  Make sure that you call for this appointment within a day or two after you arrive home to insure a convenient appointment time. OTHER INSTRUCTIONS:  _____________________________________________________________ _____________________________________________________________  WHEN TO CALL YOUR DOCTOR: 1. Fever over 101.0 2. Inability to urinate 3. Nausea and/or vomiting 4. Extreme swelling or bruising 5. Continued bleeding from incision. 6. Increased pain, redness, or drainage from the incision. 7. Difficulty swallowing or breathing 8. Muscle cramping or spasms. 9. Numbness or tingling in hands or feet or around lips.  The clinic staff is available to   answer your questions during regular business hours.  Please don't hesitate to call and ask to speak to one of the nurses if you have concerns.  For further questions, please visit [email or web address]   

## 2013-03-09 NOTE — Progress Notes (Signed)
Patient ID: Emily [name], female   DOB: [birth year], 53 y.o.   MRN: [id number] 3 Days Post-Op  Subjective: Right shoulder pain continues to be her only complaint. Requiring pain medication only for this. It only hurts when she stands. It is not related to motion of her shoulder. Denies abdominal pain or any GI complaints.  Objective: Vital signs in last 24 hours: Temp:  [97.6 F (36.4 C)-97.9 F (36.6 C)] 97.6 F (36.4 C) (01/11 0518) Pulse Rate:  [76-99] 79 (01/11 0518) Resp:  [17-18] 18 (01/11 0518) BP: (108-131)/(58-70) 131/70 mmHg (01/11 0518) SpO2:  [93 %-99 %] 95 % (01/11 0518) Weight:  [145 lb 8 oz (65.998 kg)] 145 lb 8 oz (65.998 kg) (01/10 0801) Last BM Date: 03/06/13  Intake/Output from previous day: 01/10 0701 - 01/11 0700 In: 1790 [P.O.:240; I.V.:1550] Out: 500 [Urine:500] Intake/Output this shift:    General appearance: alert, cooperative and no distress GI: normal findings: soft, non-tender and nondistended Extremities: she has normal range of motion of the right shoulder without pain and no tenderness or swelling Incision/Wound: lean and dry without rash or infection  Lab Results:   Recent Labs  03/07/13 0618 03/09/13 0600  WBC 14.3* 6.9  HGB 13.4 12.0  HCT 40.5 35.9*  PLT 173 131*   BMET  Recent Labs  03/07/13 0618  NA 139  K 3.7  CL 98  CO2 28  GLUCOSE 209*  BUN 13  CREATININE 0.77  CALCIUM 8.2*   CBG (last 3)   Recent Labs  03/08/13 1159 03/08/13 1654 03/08/13 2210  GLUCAP 177* 247* 170*      Studies/Results: No results found.  Anti-infectives: Anti-infectives   Start     Dose/Rate Route Frequency Ordered Stop   03/06/13 0600  cefOXitin (MEFOXIN) 2 g in dextrose 5 % 50 mL IVPB     2 g 100 mL/hr over 30 Minutes Intravenous On call to O.R. 03/05/13 1255 03/06/13 0735      Assessment/Plan: s/p Procedure(s): ATTEMPTED LAPAROSCOPIC CHOLECYSTECTOMY OPEN CHOLECYSTECTOMY Generally doing very well. I suspect her shoulder  pain must be referred pain from some residual subdiaphragmatic irritation and she has good range of motion and no tenderness in the shoulder and it only hurts when she is standing Her abdomen seems totally benign and white blood count normal. I will check an x-ray of her right shoulder. If this is negative I think she is okay for discharge with followup in the office in 6 or 7 days for staple removal.   LOS: 3 days    Jamorion Gomillion T 03/09/2013

## 2013-03-09 NOTE — Progress Notes (Signed)
Discharge instructions gone over with patient. Home medications gone over. Follow up appointment is made. Prescription given. Diet, signs and symptoms of infection, activity, and incisional care gone over. Patient is not ready to quit smoking. Heart failure issues discussed. Patient verbalized appropriately on all issues. Patient verbalized understanding of when to call the doctor and understanding of instructions.

## 2013-03-10 ENCOUNTER — Encounter (HOSPITAL_COMMUNITY): Payer: Self-pay | Admitting: General Surgery

## 2013-03-10 ENCOUNTER — Telehealth (INDEPENDENT_AMBULATORY_CARE_PROVIDER_SITE_OTHER): Payer: Self-pay

## 2013-03-10 NOTE — Telephone Encounter (Signed)
Patient is asking for appt for suture removal,order is needed for nurse only staple removal then an  appt

## 2013-03-10 NOTE — Telephone Encounter (Signed)
She can come in ten days postop for nurse only to take staples out or if im here around then can see me.

## 2013-03-11 NOTE — Telephone Encounter (Signed)
Called pt and made her an appt to see Dr Donne Hazel on 03/18/13 for po check along with staple removal. The pt understands.

## 2013-03-14 ENCOUNTER — Other Ambulatory Visit: Payer: Self-pay | Admitting: *Deleted

## 2013-03-14 DIAGNOSIS — G4700 Insomnia, unspecified: Secondary | ICD-10-CM

## 2013-03-14 DIAGNOSIS — F411 Generalized anxiety disorder: Secondary | ICD-10-CM

## 2013-03-14 MED ORDER — LORAZEPAM 2 MG PO TABS: 2.0000 mg | ORAL_TABLET | Freq: Every day | ORAL | Status: DC

## 2013-03-14 NOTE — Telephone Encounter (Signed)
Refill request for lorazepam  Last filled by MD on - 02/04/13 #30 x0 Last Appt: 02/04/2013 Next Appt: none Please advise refill?

## 2013-03-18 ENCOUNTER — Encounter (INDEPENDENT_AMBULATORY_CARE_PROVIDER_SITE_OTHER): Payer: Self-pay | Admitting: General Surgery

## 2013-03-18 ENCOUNTER — Ambulatory Visit (INDEPENDENT_AMBULATORY_CARE_PROVIDER_SITE_OTHER): Payer: Medicare Other | Admitting: General Surgery

## 2013-03-18 VITALS — BP 128/64 | HR 86 | Temp 98.0°F | Resp 18 | Ht 65.0 in | Wt 143.0 lb

## 2013-03-18 DIAGNOSIS — Z09 Encounter for follow-up examination after completed treatment for conditions other than malignant neoplasm: Secondary | ICD-10-CM

## 2013-03-18 NOTE — Progress Notes (Signed)
Subjective:     Patient ID: Emily [name], female   DOB: Mar 21, 1960, 53 y.o.   MRN: [id number]  HPI This is a 53 year old female who I didn't do an open cholecystectomy for a history of pancreatitis. She has a significant amount of adhesions down in her pelvis that includes her bowel as well as her omentum. She also had a significant amount of adhesions in the right upper quadrant for pancreatitis made it unsafe to do this with the laparoscope. I did this in an open fashion and she did very well from that. She returns today doing well without any significant complaints at all. She is here for staple removal. Her pathology shows that she has cholelithiasis.  Review of Systems     Objective:   Physical Exam Healing laparoscopic incisions and a right upper quadrant incision, there is no evidence of infection, I removed her staples and applied Steri-Strips.    Assessment:     Status post cholecystectomy     Plan:     She can increase her activity at 4 weeks postoperatively. We'll plan on seeing her back as needed at this point. steristrips will come off on their own

## 2013-03-20 ENCOUNTER — Encounter (HOSPITAL_COMMUNITY): Payer: Medicare Other | Attending: Hematology and Oncology

## 2013-03-20 ENCOUNTER — Encounter (HOSPITAL_COMMUNITY): Payer: Self-pay

## 2013-03-20 ENCOUNTER — Encounter (HOSPITAL_COMMUNITY): Payer: Medicare Other

## 2013-03-20 ENCOUNTER — Other Ambulatory Visit: Payer: Self-pay | Admitting: *Deleted

## 2013-03-20 VITALS — BP 117/57 | HR 92 | Temp 97.4°F | Resp 20 | Wt 146.1 lb

## 2013-03-20 DIAGNOSIS — I5022 Chronic systolic (congestive) heart failure: Secondary | ICD-10-CM | POA: Insufficient documentation

## 2013-03-20 DIAGNOSIS — Z170 Estrogen receptor positive status [ER+]: Secondary | ICD-10-CM

## 2013-03-20 DIAGNOSIS — E1149 Type 2 diabetes mellitus with other diabetic neurological complication: Secondary | ICD-10-CM | POA: Insufficient documentation

## 2013-03-20 DIAGNOSIS — C50919 Malignant neoplasm of unspecified site of unspecified female breast: Secondary | ICD-10-CM

## 2013-03-20 DIAGNOSIS — Z853 Personal history of malignant neoplasm of breast: Secondary | ICD-10-CM | POA: Insufficient documentation

## 2013-03-20 DIAGNOSIS — E119 Type 2 diabetes mellitus without complications: Secondary | ICD-10-CM

## 2013-03-20 DIAGNOSIS — I509 Heart failure, unspecified: Secondary | ICD-10-CM | POA: Insufficient documentation

## 2013-03-20 DIAGNOSIS — Z09 Encounter for follow-up examination after completed treatment for conditions other than malignant neoplasm: Secondary | ICD-10-CM | POA: Insufficient documentation

## 2013-03-20 DIAGNOSIS — C50911 Malignant neoplasm of unspecified site of right female breast: Secondary | ICD-10-CM

## 2013-03-20 DIAGNOSIS — E1142 Type 2 diabetes mellitus with diabetic polyneuropathy: Secondary | ICD-10-CM | POA: Insufficient documentation

## 2013-03-20 DIAGNOSIS — I10 Essential (primary) hypertension: Secondary | ICD-10-CM | POA: Insufficient documentation

## 2013-03-20 DIAGNOSIS — J449 Chronic obstructive pulmonary disease, unspecified: Secondary | ICD-10-CM

## 2013-03-20 DIAGNOSIS — L299 Pruritus, unspecified: Secondary | ICD-10-CM

## 2013-03-20 LAB — CBC WITH DIFFERENTIAL/PLATELET
BASOS PCT: 1 % (ref 0–1)
Basophils Absolute: 0.1 10*3/uL (ref 0.0–0.1)
Eosinophils Absolute: 1.5 10*3/uL — ABNORMAL HIGH (ref 0.0–0.7)
Eosinophils Relative: 11 % — ABNORMAL HIGH (ref 0–5)
HEMATOCRIT: 42.8 % (ref 36.0–46.0)
Hemoglobin: 14.3 g/dL (ref 12.0–15.0)
LYMPHS ABS: 4.1 10*3/uL — AB (ref 0.7–4.0)
Lymphocytes Relative: 30 % (ref 12–46)
MCH: 29.5 pg (ref 26.0–34.0)
MCHC: 33.4 g/dL (ref 30.0–36.0)
MCV: 88.2 fL (ref 78.0–100.0)
MONO ABS: 0.6 10*3/uL (ref 0.1–1.0)
MONOS PCT: 4 % (ref 3–12)
NEUTROS ABS: 7.5 10*3/uL (ref 1.7–7.7)
Neutrophils Relative %: 55 % (ref 43–77)
Platelets: 254 10*3/uL (ref 150–400)
RBC: 4.85 MIL/uL (ref 3.87–5.11)
RDW: 13.4 % (ref 11.5–15.5)
WBC: 13.8 10*3/uL — ABNORMAL HIGH (ref 4.0–10.5)

## 2013-03-20 LAB — COMPREHENSIVE METABOLIC PANEL
ALK PHOS: 61 U/L (ref 39–117)
ALT: 28 U/L (ref 0–35)
AST: 26 U/L (ref 0–37)
Albumin: 4 g/dL (ref 3.5–5.2)
BILIRUBIN TOTAL: 0.3 mg/dL (ref 0.3–1.2)
BUN: 15 mg/dL (ref 6–23)
CHLORIDE: 98 meq/L (ref 96–112)
CO2: 22 meq/L (ref 19–32)
Calcium: 9.4 mg/dL (ref 8.4–10.5)
Creatinine, Ser: 0.73 mg/dL (ref 0.50–1.10)
Glucose, Bld: 255 mg/dL — ABNORMAL HIGH (ref 70–99)
Potassium: 4.6 mEq/L (ref 3.7–5.3)
Sodium: 138 mEq/L (ref 137–147)
Total Protein: 7.7 g/dL (ref 6.0–8.3)

## 2013-03-20 NOTE — Telephone Encounter (Signed)
Patient left vm requesting new One Touch Ultra meter & test strips refill. Patient requested Hydroxyzine HCI 25 mg. Patient also requested gabapentin 400 mg and to take 2 tablets TID. Patient stated that her legs have hurting really bad lately. Please advise?

## 2013-03-20 NOTE — Patient Instructions (Signed)
[facility] Discharge Instructions  RECOMMENDATIONS MADE BY THE CONSULTANT AND ANY TEST RESULTS WILL BE SENT TO YOUR REFERRING PHYSICIAN.  EXAM FINDINGS BY THE PHYSICIAN TODAY AND SIGNS OR SYMPTOMS TO REPORT TO CLINIC OR PRIMARY PHYSICIAN:   Stay on the Femara! You have 5 positive lymph nodes so Dr. Barnet Glasgow wants you to continue the Femara.  Return in 6 months to see MD  Labs today    Thank you for choosing [facility] to provide your oncology and hematology care.  To afford each patient quality time with our providers, please arrive at least 15 minutes before your scheduled appointment time.  With your help, our goal is to use those 15 minutes to complete the necessary work-up to ensure our physicians have the information they need to help with your evaluation and healthcare recommendations.    Effective January 1st, 2014, we ask that you re-schedule your appointment with our physicians should you arrive 10 or more minutes late for your appointment.  We strive to give you quality time with our providers, and arriving late affects you and other patients whose appointments are after yours.    Again, thank you for choosing [facility].  Our hope is that these requests will decrease the amount of time that you wait before being seen by our physicians.       _____________________________________________________________  Should you have questions after your visit to [facility], please contact our office at [phone] between the hours of 8:30 a.m. and 5:00 p.m.  Voicemails left after 4:30 p.m. will not be returned until the following business day.  For prescription refill requests, have your pharmacy contact our office with your prescription refill request.
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
## 2013-03-20 NOTE — Progress Notes (Signed)
Emily [name] presented for labwork. Labs per MD order drawn via Peripheral Line 23 gauge needle inserted in LT hand Good blood return present. Procedure without incident.  Needle removed intact. Patient tolerated procedure well.

## 2013-03-21 LAB — CANCER ANTIGEN 27.29: CA 27.29: 31 U/mL (ref 0–39)

## 2013-03-21 LAB — CEA: CEA: 0.7 ng/mL (ref 0.0–5.0)

## 2013-03-24 ENCOUNTER — Other Ambulatory Visit (HOSPITAL_COMMUNITY): Payer: Self-pay | Admitting: Oncology

## 2013-03-24 DIAGNOSIS — C50919 Malignant neoplasm of unspecified site of unspecified female breast: Secondary | ICD-10-CM

## 2013-03-24 MED ORDER — LETROZOLE 2.5 MG PO TABS: 2.5000 mg | ORAL_TABLET | Freq: Every morning | ORAL | Status: DC

## 2013-03-25 ENCOUNTER — Telehealth: Payer: Self-pay | Admitting: Nurse Practitioner

## 2013-03-25 DIAGNOSIS — E119 Type 2 diabetes mellitus without complications: Secondary | ICD-10-CM

## 2013-03-25 DIAGNOSIS — C50919 Malignant neoplasm of unspecified site of unspecified female breast: Secondary | ICD-10-CM

## 2013-03-25 DIAGNOSIS — M792 Neuralgia and neuritis, unspecified: Secondary | ICD-10-CM

## 2013-03-25 MED ORDER — GABAPENTIN 400 MG PO CAPS: ORAL_CAPSULE | ORAL | Status: DC

## 2013-03-25 MED ORDER — GLUCOSE BLOOD VI STRP: ORAL_STRIP | Status: DC

## 2013-03-25 MED ORDER — HYDROXYZINE HCL 25 MG PO TABS: 25.0000 mg | ORAL_TABLET | Freq: Every day | ORAL | Status: DC

## 2013-03-25 MED ORDER — ONETOUCH ULTRA SYSTEM W/DEVICE KIT: 1.0000 | PACK | Freq: Once | Status: DC

## 2013-03-25 NOTE — Telephone Encounter (Signed)
Please contact patient after lunch.

## 2013-03-25 NOTE — Telephone Encounter (Signed)
Spoke w/pt. She is having back & leg pain, chronic. She hoped back pain would get better after GB sugery, but it has not & leg & feet neuropathy ARE worse since she decreased gabapentin to qd from tid. Will increase dose back to tid, adv pt to increase to bid for 2 weeks, then start tid. Sent script for new meter & refilled hydroxyzine.  Reviewed 7/14 l-s xray: "corticated SI joints". Ankylosing spondylitis? ASked pt to CB if no relief w/increasing gabapentin, will refer to spine specialists. Or get MRI of SI joint. Took celebrex in past w/no relief.

## 2013-03-25 NOTE — Telephone Encounter (Signed)
Relevant patient education assigned to patient using Emmi. ° °

## 2013-03-25 NOTE — Discharge Summary (Signed)
Physician Discharge Summary  Patient ID: Emily [name] MRN: [id number] DOB/AGE: 07-02-1960 53 y.o.  Admit date: 03/06/2013 Discharge date: 03/25/2013  Admission Diagnoses: Symptomatic cholelithiasis  Discharge Diagnoses:  Active Problems:   S/P cholecystectomy   Discharged Condition: good  Hospital Course: 53 yof with symptomatic cholelithiasis and history of pancreatitis.  She underwent eventual open cholecystectomy due to her adhesions and likely reaction to pancreatitis.  Her diet was advanced and her pain was well controlled.  She was discharged home.  Consults: None  Significant Diagnostic Studies: none  Treatments: surgery: open cholecystectomy    Disposition: 01-Home or Self Care  Discharge Orders   Future Appointments Provider Department Dept Phone   03/27/2013 12:00 PM [place] [phone]   09/17/2013 1:00 PM [place] Covering Provider [place] [phone]   Future Orders Complete By Expires   Discharge patient  As directed    Comments:     After shoulder x-ray is completed and results are called to me. Please page me, [phone]       Medication List    STOP taking these medications       gabapentin 400 MG tablet  Commonly known as:  NEURONTIN      TAKE these medications       albuterol 108 (90 BASE) MCG/ACT inhaler  Commonly known as:  PROVENTIL HFA;VENTOLIN HFA  Inhale 1-2 puffs into the lungs every 6 (six) hours as needed for wheezing or shortness of breath.     aspirin EC 81 MG tablet  Take 81 mg by mouth daily.     atorvastatin 20 MG tablet  Commonly known as:  LIPITOR  Take 1 tablet (20 mg total) by mouth daily.     calcium-vitamin D 500-200 MG-UNIT per tablet  Commonly known as:  OSCAL WITH D  Take 1 tablet by mouth 2 (two) times daily.     carvedilol 6.25 MG tablet  Commonly known as:  COREG  Take 1 tablet (6.25 mg total) by mouth 2 (two) times daily with a  meal.     digoxin 0.125 MG tablet  Commonly known as:  LANOXIN  TAKE ONE TABLET BY MOUTH ONCE DAILY     enalapril 10 MG tablet  Commonly known as:  VASOTEC  Take 5-10 mg by mouth 2 (two) times daily. Take 5mg  in AM and 10mg  in PM     ferrous sulfate 325 (65 FE) MG tablet  Take 325 mg by mouth at bedtime.     furosemide 40 MG tablet  Commonly known as:  LASIX  Take 40 mg by mouth daily. Take extra tab if needed for wt gain     HYDROcodone-acetaminophen 5-325 MG per tablet  Commonly known as:  NORCO/VICODIN  Take 1 tablet by mouth every 6 (six) hours as needed for pain.     HYDROcodone-acetaminophen 5-325 MG per tablet  Commonly known as:  NORCO/VICODIN  Take 1-2 tablets by mouth every 4 (four) hours as needed for moderate pain or severe pain.     Insulin Detemir 100 UNIT/ML Pen  Commonly known as:  LEVEMIR  Administer 10 u [place] with breakfast and 13 u [place] with supper.     Insulin Pen Needle 32G X 6 MM Misc  1 Device by Does not apply route daily.     loperamide 2 MG capsule  Commonly known as:  IMODIUM  Take 2 mg by mouth daily as needed for diarrhea or loose  stools.     magnesium oxide 400 MG tablet  Commonly known as:  MAG-OX  Take 400 mg by mouth daily.     metFORMIN 1000 MG tablet  Commonly known as:  GLUCOPHAGE  Take 1 tablet (1,000 mg total) by mouth 2 (two) times daily with a meal.     omeprazole 40 MG capsule  Commonly known as:  PRILOSEC  Take 1 capsule (40 mg total) by mouth every morning.     ondansetron 8 MG tablet  Commonly known as:  ZOFRAN  Take 1 tablet (8 mg total) by mouth every 8 (eight) hours as needed for nausea or vomiting.     potassium chloride SA 20 MEQ tablet  Commonly known as:  K-DUR,KLOR-CON  Take 40 mEq by mouth daily.     spironolactone 25 MG tablet  Commonly known as:  ALDACTONE  TAKE ONE TABLET BY MOUTH ONCE DAILY     VITAMIN B 12 PO  Take 1 tablet by mouth daily.     VOLTAREN 1 % Gel  Generic drug:  diclofenac sodium   Apply 1 application topically 4 (four) times daily as needed. Apply to painful areas on hands for arthritis           Follow-up Information   Follow up with [facility], [place]. Schedule an appointment as soon as possible for a visit in 1 week.   Specialty:  General Surgery   Contact information:   [address] [place]  [address] [phone]       Signed: Rolm Bookbinder 03/25/2013, 3:17 PM

## 2013-03-27 ENCOUNTER — Other Ambulatory Visit (HOSPITAL_COMMUNITY): Payer: Self-pay

## 2013-03-27 ENCOUNTER — Ambulatory Visit (HOSPITAL_COMMUNITY)
Admission: RE | Admit: 2013-03-27 | Discharge: 2013-03-27 | Disposition: A | Discharge status: Home / Self Care | Payer: Medicare Other | Source: Ambulatory Visit | Attending: Internal Medicine | Admitting: Internal Medicine

## 2013-03-27 ENCOUNTER — Encounter (INDEPENDENT_AMBULATORY_CARE_PROVIDER_SITE_OTHER): Payer: Medicare Other | Admitting: General Surgery

## 2013-03-27 ENCOUNTER — Encounter (HOSPITAL_COMMUNITY): Payer: Self-pay

## 2013-03-27 VITALS — BP 98/58 | HR 79 | Resp 16 | Wt 142.8 lb

## 2013-03-27 DIAGNOSIS — F17200 Nicotine dependence, unspecified, uncomplicated: Secondary | ICD-10-CM | POA: Insufficient documentation

## 2013-03-27 DIAGNOSIS — C50919 Malignant neoplasm of unspecified site of unspecified female breast: Secondary | ICD-10-CM

## 2013-03-27 DIAGNOSIS — M792 Neuralgia and neuritis, unspecified: Secondary | ICD-10-CM

## 2013-03-27 DIAGNOSIS — I2510 Atherosclerotic heart disease of native coronary artery without angina pectoris: Secondary | ICD-10-CM | POA: Insufficient documentation

## 2013-03-27 DIAGNOSIS — I509 Heart failure, unspecified: Secondary | ICD-10-CM

## 2013-03-27 DIAGNOSIS — R112 Nausea with vomiting, unspecified: Secondary | ICD-10-CM

## 2013-03-27 DIAGNOSIS — I5022 Chronic systolic (congestive) heart failure: Secondary | ICD-10-CM | POA: Insufficient documentation

## 2013-03-27 DIAGNOSIS — L299 Pruritus, unspecified: Secondary | ICD-10-CM

## 2013-03-27 DIAGNOSIS — E119 Type 2 diabetes mellitus without complications: Secondary | ICD-10-CM

## 2013-03-27 MED ORDER — MAGNESIUM OXIDE 400 MG PO TABS: 400.0000 mg | ORAL_TABLET | Freq: Every day | ORAL | Status: DC

## 2013-03-27 MED ORDER — POTASSIUM CHLORIDE CRYS ER 20 MEQ PO TBCR: 40.0000 meq | EXTENDED_RELEASE_TABLET | Freq: Every day | ORAL | Status: DC

## 2013-03-27 MED ORDER — HYDROXYZINE HCL 25 MG PO TABS: 25.0000 mg | ORAL_TABLET | Freq: Every day | ORAL | Status: DC

## 2013-03-27 MED ORDER — OMEPRAZOLE 40 MG PO CPDR: 40.0000 mg | DELAYED_RELEASE_CAPSULE | Freq: Every morning | ORAL | Status: DC

## 2013-03-27 MED ORDER — ASPIRIN EC 81 MG PO TBEC: 81.0000 mg | DELAYED_RELEASE_TABLET | Freq: Every day | ORAL | Status: AC

## 2013-03-27 MED ORDER — GABAPENTIN 400 MG PO CAPS: ORAL_CAPSULE | ORAL | Status: DC

## 2013-03-27 MED ORDER — FERROUS SULFATE 325 (65 FE) MG PO TABS: 325.0000 mg | ORAL_TABLET | Freq: Every day | ORAL | Status: DC

## 2013-03-27 MED ORDER — ATORVASTATIN CALCIUM 20 MG PO TABS: 20.0000 mg | ORAL_TABLET | Freq: Every day | ORAL | Status: DC

## 2013-03-27 MED ORDER — METFORMIN HCL 1000 MG PO TABS: 1000.0000 mg | ORAL_TABLET | Freq: Two times a day (BID) | ORAL | Status: DC

## 2013-03-27 MED ORDER — LETROZOLE 2.5 MG PO TABS: 2.5000 mg | ORAL_TABLET | Freq: Every morning | ORAL | Status: DC

## 2013-03-27 MED ORDER — DIGOXIN 125 MCG PO TABS: ORAL_TABLET | ORAL | Status: DC

## 2013-03-27 MED ORDER — ENALAPRIL MALEATE 10 MG PO TABS: 5.0000 mg | ORAL_TABLET | Freq: Two times a day (BID) | ORAL | Status: DC

## 2013-03-27 MED ORDER — SPIRONOLACTONE 25 MG PO TABS: ORAL_TABLET | ORAL | Status: DC

## 2013-03-27 MED ORDER — CARVEDILOL 6.25 MG PO TABS: 6.2500 mg | ORAL_TABLET | Freq: Two times a day (BID) | ORAL | Status: DC

## 2013-03-27 MED ORDER — FUROSEMIDE 40 MG PO TABS: 40.0000 mg | ORAL_TABLET | Freq: Every day | ORAL | Status: DC

## 2013-03-27 MED ORDER — CALCIUM CARBONATE-VITAMIN D 500-200 MG-UNIT PO TABS: 1.0000 | ORAL_TABLET | Freq: Two times a day (BID) | ORAL | Status: DC

## 2013-03-27 NOTE — Patient Instructions (Signed)
Doing great.  Continue to try and quit smoking.  Follow up end of March with ECHO.  Do the following things EVERYDAY: 1) Weigh yourself in the morning before breakfast. Write it down and keep it in a log. 2) Take your medicines as prescribed 3) Eat low salt foods-Limit salt (sodium) to 2000 mg per day.  4) Stay as active as you can everyday 5) Limit all fluids for the day to less than 2 liters 6)

## 2013-03-27 NOTE — Progress Notes (Signed)
Patient ID: Emily [name], female   DOB: [birth year], 53 y.o.   MRN: [id number]  PCP: Dr Kathlen Mody Primary Cardiologist: Dr. Haroldine Laws  Emily [name] is a 53 yo woman with a history of ER + R breast cancer s/p R lumpectomy, DM2, cholelithiasis s/p cholecystectomy (02/2013), COPD and systolic CHF due to nonischemic cardiomyopathy.  Patient was admitted in 09/2012 with acute pulmonary edema and cardiogenic shock.  LHC showed moderate nonobstructive CAD (40-50% mLAD, 50-70% LCx, 50% PDA).  She was started on milrinone and diuresed.  Cause of cardiomyopathy suspected to be Adriamycin toxicity.    ECHO 10/27/12 EF 20-25% ECHO 01/09/13 EF 30-35%  12/05/12 Carotid Dopplers- R 39 % stenosis and L  40-59% stenosis  Follow up: Last visit increased enalapril to 10 mg nightly which she tolerated. Overall feeling fatigued since cholecystectomy 1/87/2015. Denies SOB, orthopnea, edema or CP. Weight stable 140-143lbs. Following a low salt diet and drinking less than 2L a day. Taking medications as prescribed. Able to go upstairs with no issues and can walk around grocery store without stopping. Smoking 1ppd.   Labs (9/14): digoxin 0.4, K 3.6, creatinine 0.62 Labs (9/15): K 4.4 creatinine 0.68 Labs (10/26):    K 3.7 Cr 0.67 Labs: (02/2013): K+ 4.6, Cr 0.73  PMH: 1. COPD: PFTs (1/14) with FEV1 68%.  Quit smoking 9/14.  2. Type II diabetes  3. HTN 4. Breast cancer: 2009, s/p Adriamycin/Cytoxan/Taxol chemo, lumpectomy and radiation.   5. Nonischemic cardiomyopathy: Possibly due to Adriamycin.  Echo (8/14) with EF 20-25%, global hypokinesis, moderate diastolic dysfunction, normal RV size and with mild to moderately decreased systolic function, moderate TR.   6. CAD: LHC (8/14) with nonobstructive CAD; 40-50% mLAD, 50-70% LCx, 50% PDA.  7. Hyperlipidemia  SH: Lives alone in [place].  Mother is next door.  Quit smoking in 9/14.  FH: No premature CAD.  No family history of cardiomyopathy.   ROS: All systems reviewed and  negative except as per HPI.   Current Outpatient Prescriptions  Medication Sig Dispense Refill  . albuterol (PROVENTIL HFA;VENTOLIN HFA) 108 (90 BASE) MCG/ACT inhaler Inhale 1-2 puffs into the lungs every 6 (six) hours as needed for wheezing or shortness of breath.      Marland Kitchen aspirin EC 81 MG tablet Take 81 mg by mouth daily.      Marland Kitchen atorvastatin (LIPITOR) 20 MG tablet Take 1 tablet (20 mg total) by mouth daily.  30 tablet  1  . Blood Glucose Monitoring Suppl (ONE TOUCH ULTRA SYSTEM KIT) W/DEVICE KIT 1 kit by Does not apply route once.  1 each  0  . calcium-vitamin D (OSCAL WITH D) 500-200 MG-UNIT per tablet Take 1 tablet by mouth 2 (two) times daily.      . carvedilol (COREG) 6.25 MG tablet Take 1 tablet (6.25 mg total) by mouth 2 (two) times daily with a meal.  60 tablet  6  . Cyanocobalamin (VITAMIN B 12 PO) Take 1 tablet by mouth daily.      . diclofenac sodium (VOLTAREN) 1 % GEL Apply 1 application topically 4 (four) times daily as needed. Apply to painful areas on hands for arthritis      . digoxin (LANOXIN) 0.125 MG tablet TAKE ONE TABLET BY MOUTH ONCE DAILY  30 tablet  6  . enalapril (VASOTEC) 10 MG tablet Take 5-10 mg by mouth 2 (two) times daily. Take 69m in AM and 159min PM      . ferrous sulfate 325 (65 FE) MG tablet Take  325 mg by mouth at bedtime.       . furosemide (LASIX) 40 MG tablet Take 40 mg by mouth daily. Take extra tab if needed for wt gain      . gabapentin (NEURONTIN) 400 MG capsule Increase to 800 mg bid po for 2 weeks, then increase to 800 mg TID po  180 capsule  2  . glucose blood (ONE TOUCH ULTRA TEST) test strip Use 1 strip in meter.  100 each  4  . HYDROcodone-acetaminophen (NORCO/VICODIN) 5-325 MG per tablet Take 1 tablet by mouth every 6 (six) hours as needed for pain.  30 tablet  0  . HYDROcodone-acetaminophen (NORCO/VICODIN) 5-325 MG per tablet Take 1-2 tablets by mouth every 4 (four) hours as needed for moderate pain or severe pain.  50 tablet  0  . hydrOXYzine  (ATARAX/VISTARIL) 25 MG tablet Take 1 tablet (25 mg total) by mouth at bedtime.  30 tablet  3  . Insulin Detemir (LEVEMIR) 100 UNIT/ML Pen Administer 10 u [place] with breakfast and 13 u [place] with supper.  3 pen  1  . Insulin Pen Needle 32G X 6 MM MISC 1 Device by Does not apply route daily.  1 each  1  . letrozole (FEMARA) 2.5 MG tablet Take 1 tablet (2.5 mg total) by mouth every morning.  90 tablet  2  . loperamide (IMODIUM) 2 MG capsule Take 2 mg by mouth daily as needed for diarrhea or loose stools.       Marland Kitchen LORazepam (ATIVAN) 2 MG tablet Take 1 tablet (2 mg total) by mouth at bedtime.  30 tablet  1  . magnesium oxide (MAG-OX) 400 MG tablet Take 400 mg by mouth daily.      . metFORMIN (GLUCOPHAGE) 1000 MG tablet Take 1 tablet (1,000 mg total) by mouth 2 (two) times daily with a meal.  60 tablet  2  . omeprazole (PRILOSEC) 40 MG capsule Take 1 capsule (40 mg total) by mouth every morning.  30 capsule  1  . ondansetron (ZOFRAN) 8 MG tablet Take 1 tablet (8 mg total) by mouth every 8 (eight) hours as needed for nausea or vomiting.  20 tablet  0  . potassium chloride SA (K-DUR,KLOR-CON) 20 MEQ tablet Take 40 mEq by mouth daily.      Marland Kitchen spironolactone (ALDACTONE) 25 MG tablet TAKE ONE TABLET BY MOUTH ONCE DAILY  30 tablet  6   No current facility-administered medications for this encounter.    Filed Vitals:   03/27/13 1205  BP: 98/58  Pulse: 79  Resp: 16  Weight: 142 lb 12 oz (64.751 kg)  SpO2: 98%   General: NAD Neck: JVP flat, no thyromegaly or thyroid nodule.  Lungs: Clear to auscultation bilaterally with normal respiratory effort. Decreased BS throughout.  CV: Nondisplaced PMI.  Heart regular S1/S2, no S3/S4, no murmur.  No peripheral edema.  No carotid bruit.  Normal pedal pulses.  Abdomen: Soft, mildly tender, no hepatosplenomegaly, no distention. R scar Skin: Intact without lesions or rashes.  Neurologic: Alert and oriented x 3.  Psych: Normal affect. Extremities: No clubbing or  cyanosis.  HEENT: Normal.   Assessment/Plan:  1. Chronic systolic CHF: NICM, likely Adriamycin toxicity, EF 55-35% (12/2012) - NYHA II symptoms and volume status stable. Continue lasix 40 mg daily. - Continue coreg 6.25 mg BID, will not titrate with fatigue and hypotension. - Continue enalapril 5 mg q am and 10 mg qpm. - Reinforced the need and importance of daily weights,  a low sodium diet, and fluid restriction (less than 2 L a day). Instructed to call the HF clinic if weight increases more than 3 lbs overnight or 5 lbs in a week.  2. CAD: Moderate nonobstructive disease.  Continue ASA 81, statin and BB.  3. COPD: Still smoking. I have encouraged her to stop once again.  Will bring back in March to repeat ECHO if EF remains less than 35% will refer to EP for ICD. QRS 80 msec  Rande Brunt NP-C 03/27/2013  Patient seen and examined with Junie Bame, NP. We discussed all aspects of the encounter. I agree with the assessment and plan as stated above. Doing very well. BP soft. Will not titrate meds at this point. Continue current regimen. F/u echo at next visit. If EF  Remains 35% or less will refer for ICD.   Daniel Bensimhon,MD 1:00 PM

## 2013-04-01 ENCOUNTER — Telehealth (HOSPITAL_COMMUNITY): Payer: Self-pay | Admitting: Cardiology

## 2013-04-01 MED ORDER — POTASSIUM CHLORIDE POWD: 40.0000 meq | Freq: Every day | Status: DC

## 2013-04-01 NOTE — Telephone Encounter (Signed)
rx sent in for powdered kcl left mess for pt this was done

## 2013-04-01 NOTE — Telephone Encounter (Signed)
Pt called to request potassium powder She is unable to swallow large tablets, was advised by pharmacy to request powder

## 2013-04-08 ENCOUNTER — Ambulatory Visit (INDEPENDENT_AMBULATORY_CARE_PROVIDER_SITE_OTHER): Payer: Medicare Other | Admitting: Nurse Practitioner

## 2013-04-08 VITALS — BP 110/76 | HR 87 | Temp 98.2°F | Resp 16 | Ht 65.0 in | Wt 145.0 lb

## 2013-04-08 DIAGNOSIS — IMO0002 Reserved for concepts with insufficient information to code with codable children: Secondary | ICD-10-CM

## 2013-04-08 DIAGNOSIS — G4700 Insomnia, unspecified: Secondary | ICD-10-CM

## 2013-04-08 DIAGNOSIS — E1165 Type 2 diabetes mellitus with hyperglycemia: Secondary | ICD-10-CM

## 2013-04-08 DIAGNOSIS — D509 Iron deficiency anemia, unspecified: Secondary | ICD-10-CM

## 2013-04-08 DIAGNOSIS — IMO0001 Reserved for inherently not codable concepts without codable children: Secondary | ICD-10-CM

## 2013-04-08 DIAGNOSIS — E1140 Type 2 diabetes mellitus with diabetic neuropathy, unspecified: Secondary | ICD-10-CM | POA: Insufficient documentation

## 2013-04-08 DIAGNOSIS — R112 Nausea with vomiting, unspecified: Secondary | ICD-10-CM

## 2013-04-08 DIAGNOSIS — M1990 Unspecified osteoarthritis, unspecified site: Secondary | ICD-10-CM

## 2013-04-08 DIAGNOSIS — M129 Arthropathy, unspecified: Secondary | ICD-10-CM

## 2013-04-08 DIAGNOSIS — M792 Neuralgia and neuritis, unspecified: Secondary | ICD-10-CM

## 2013-04-08 DIAGNOSIS — E119 Type 2 diabetes mellitus without complications: Secondary | ICD-10-CM

## 2013-04-08 LAB — MICROALBUMIN / CREATININE URINE RATIO
Creatinine,U: 13.6 mg/dL
MICROALB/CREAT RATIO: 1.5 mg/g (ref 0.0–30.0)
Microalb, Ur: 0.2 mg/dL (ref 0.0–1.9)

## 2013-04-08 LAB — HEMOGLOBIN A1C: Hgb A1c MFr Bld: 9.4 % — ABNORMAL HIGH (ref 4.6–6.5)

## 2013-04-08 MED ORDER — ONDANSETRON HCL 4 MG PO TABS: 4.0000 mg | ORAL_TABLET | Freq: Two times a day (BID) | ORAL | Status: DC

## 2013-04-08 MED ORDER — LORAZEPAM 2 MG PO TABS: 2.0000 mg | ORAL_TABLET | Freq: Every day | ORAL | Status: DC

## 2013-04-08 NOTE — Assessment & Plan Note (Signed)
Continue Gabapentin. Consider seeing ortho or spine specialist.

## 2013-04-08 NOTE — Assessment & Plan Note (Addendum)
Tight skin finger tips to PIP joints. C/o wrist pain. Suspect scleroderma, CREST, or MCTD. Will do AI labs at next appt.: ANA by IFA, SCL- 70, RNP ab, CK, aldolase, anti-centromere ab.

## 2013-04-08 NOTE — Assessment & Plan Note (Signed)
BS still running too high: pt reports FBS 160-220. HgbA1C today. Will increase dose as needed until reach max of 1 u/kg/day.

## 2013-04-08 NOTE — Assessment & Plan Note (Signed)
Sleep hygiene. Continue ativan & vistaril.

## 2013-04-08 NOTE — Progress Notes (Signed)
Subjective:     Emily [name] is a 53 y.o. female. She presents for follow up of multiple chronic conditions: DM, insomnia, foot pain, back pain, wrist pain. See overview for HPI for DM, arthritis, insomnia, neuropathy.    The following portions of the patient's history were reviewed and updated as appropriate: allergies, current medications, past medical history, past social history, past surgical history and problem list.  Review of Systems Constitutional: negative Respiratory: still smoking, wheezing Cardiovascular: negative for chest pain, chest pressure/discomfort, irregular heart beat, lower extremity edema and near-syncope Gastrointestinal: positive for abdominal pain, constipation, diarrhea and nausea Integument/breast: tight skin hands, unable to make complete fist Hematologic/lymphatic: iron deficient, taking iron supplement Musculoskeletal:positive for back pain and leg & foot pain, bilat Neurological: positive for paresthesia Behavioral/Psych: positive for anxiety, sleep disturbance and tobacco use, negative for excessive alcohol consumption and illegal drug usage Endocrine: negative for diabetic symptoms including blurry vision, increased fatigue, polydipsia, polyphagia and polyuria    Objective:    BP 110/76  Pulse 87  Temp(Src) 98.2 F (36.8 C) (Oral)  Resp 16  Ht 5\' 5"  (1.651 m)  Wt 145 lb (65.772 kg)  BMI 24.13 kg/m2  SpO2 96% BP 110/76  Pulse 87  Temp(Src) 98.2 F (36.8 C) (Oral)  Resp 16  Ht 5\' 5"  (1.651 m)  Wt 145 lb (65.772 kg)  BMI 24.13 kg/m2  SpO2 96% General appearance: alert, cooperative, appears stated age and no distress Head: Normocephalic, without obvious abnormality, atraumatic Eyes: negative findings: lids and lashes normal and conjunctivae and sclerae normal Lungs: wheezes apex - bilateral Heart: regular rate and rhythm, S1, S2 normal, no murmur, click, rub or gallop Extremities: extremities normal, atraumatic, no cyanosis or edema Skin:  tight skin fingers to pip joints, unable to make complete fist. No other tight skin findings.    Assessment & Plan    See prob list     :

## 2013-04-08 NOTE — Patient Instructions (Addendum)
Our office will call with lab results and any changes. Consider seeing orthopedist again for recurrent back pain. Have a great trip!  Diabetes and Foot Care Diabetes may cause you to have problems because of poor blood supply (circulation) to your feet and legs. This may cause the skin on your feet to become thinner, break easier, and heal more slowly. Your skin may become dry, and the skin may peel and crack. You may also have nerve damage in your legs and feet causing decreased feeling in them. You may not notice minor injuries to your feet that could lead to infections or more serious problems. Taking care of your feet is one of the most important things you can do for yourself.  HOME CARE INSTRUCTIONS  Wear shoes at all times, even in the house. Do not go barefoot. Bare feet are easily injured.  Check your feet daily for blisters, cuts, and redness. If you cannot see the bottom of your feet, use a mirror or ask someone for help.  Wash your feet with warm water (do not use hot water) and mild soap. Then pat your feet and the areas between your toes until they are completely dry. Do not soak your feet as this can dry your skin.  Apply a moisturizing lotion or petroleum jelly (that does not contain alcohol and is unscented) to the skin on your feet and to dry, brittle toenails. Do not apply lotion between your toes.  Trim your toenails straight across. Do not dig under them or around the cuticle. File the edges of your nails with an emery board or nail file.  Do not cut corns or calluses or try to remove them with medicine.  Wear clean socks or stockings every day. Make sure they are not too tight. Do not wear knee-high stockings since they may decrease blood flow to your legs.  Wear shoes that fit properly and have enough cushioning. To break in new shoes, wear them for just a few hours a day. This prevents you from injuring your feet. Always look in your shoes before you put them on to be  sure there are no objects inside.  Do not cross your legs. This may decrease the blood flow to your feet.  If you find a minor scrape, cut, or break in the skin on your feet, keep it and the skin around it clean and dry. These areas may be cleansed with mild soap and water. Do not cleanse the area with peroxide, alcohol, or iodine.  When you remove an adhesive bandage, be sure not to damage the skin around it.  If you have a wound, look at it several times a day to make sure it is healing.  Do not use heating pads or hot water bottles. They may burn your skin. If you have lost feeling in your feet or legs, you may not know it is happening until it is too late.  Make sure your health care provider performs a complete foot exam at least annually or more often if you have foot problems. Report any cuts, sores, or bruises to your health care provider immediately. SEEK MEDICAL CARE IF:   You have an injury that is not healing.  You have cuts or breaks in the skin.  You have an ingrown nail.  You notice redness on your legs or feet.  You feel burning or tingling in your legs or feet.  You have pain or cramps in your legs and feet.  Your legs or feet are numb.  Your feet always feel cold. SEEK IMMEDIATE MEDICAL CARE IF:   There is increasing redness, swelling, or pain in or around a wound.  There is a red line that goes up your leg.  Pus is coming from a wound.  You develop a fever or as directed by your health care provider.  You notice a bad smell coming from an ulcer or wound. Document Released: 02/11/2000 Document Revised: 10/16/2012 Document Reviewed: 07/23/2012 [facility] Patient Information 2014 [place].

## 2013-04-08 NOTE — Assessment & Plan Note (Signed)
Iron studies next appt.

## 2013-04-10 ENCOUNTER — Other Ambulatory Visit: Payer: Self-pay | Admitting: Nurse Practitioner

## 2013-04-10 ENCOUNTER — Telehealth: Payer: Self-pay

## 2013-04-10 ENCOUNTER — Encounter (HOSPITAL_COMMUNITY): Payer: Self-pay | Admitting: Oncology

## 2013-04-10 DIAGNOSIS — E119 Type 2 diabetes mellitus without complications: Secondary | ICD-10-CM

## 2013-04-10 MED ORDER — INSULIN DETEMIR 100 UNIT/ML FLEXPEN: PEN_INJECTOR | SUBCUTANEOUS | Status: DC

## 2013-04-10 NOTE — Telephone Encounter (Signed)
Relevant patient education assigned to patient using Emmi. ° °

## 2013-04-11 ENCOUNTER — Telehealth: Payer: Self-pay | Admitting: *Deleted

## 2013-04-11 NOTE — Telephone Encounter (Signed)
[employer] is faxing form to office. The form is to try and get med lowered to a pier 2 med. Which will make it a little bit cheaper for pt.

## 2013-04-11 NOTE — Telephone Encounter (Signed)
Pt wanted to know if she needs to have labs drawn at [facility] before her 04/28/2013 appt? Pt also wanted to know if you are going to do a scleroderma lab? Pt wants to know if she can take something else besides Levemir? Pt stated that she can barely afford the Levemir. She said pharmacy called her about her recent refill and told her that we sent in vials. Pt stated that vials cost over $100. Called [facility] and spoke with pharmacy about which would be the cheapest way for patient to take insulin.

## 2013-04-16 ENCOUNTER — Other Ambulatory Visit: Payer: Self-pay | Admitting: Nurse Practitioner

## 2013-04-16 DIAGNOSIS — M25549 Pain in joints of unspecified hand: Secondary | ICD-10-CM

## 2013-04-16 DIAGNOSIS — R739 Hyperglycemia, unspecified: Secondary | ICD-10-CM

## 2013-04-16 NOTE — Telephone Encounter (Signed)
Returned pt's call and informed her that it would be 1 to 2 hours before we could fax lab order to [facility]. Pt stated that she will just go on another day. Pt wants to change her levemir medication to something else ([facility] is Aware).  Asked pt to call back and give [place] her BS readings.

## 2013-04-16 NOTE — Progress Notes (Signed)
ordered lab work up for scleroderma/CREST/MCTD. She has thickened skin from finger tips to DIP joints. Decreased ROM fist. Amylase & lipase may be elevated w/scleroderma. Adding work-up for DM type 1 as pt bs keeps climbing.

## 2013-04-16 NOTE — Telephone Encounter (Signed)
Pt called today. She is on her way to [facility] and wanted to know if she could get her labs drawn today. Please advise.

## 2013-04-18 ENCOUNTER — Telehealth (HOSPITAL_COMMUNITY): Payer: Self-pay | Admitting: *Deleted

## 2013-04-18 NOTE — Telephone Encounter (Signed)
Pt left a voicemail with her blood sugar numbers for Michelle:  2/10  (179) 2/11  (220) 2/12  (232) 2/13  (278) 2/14  (206) 2/16  (205) 2/17  (213) 2/18  (266)

## 2013-04-18 NOTE — Telephone Encounter (Signed)
Pt was wanting powder potassium, have been dealing with the insurance company and they have denied med, pt is aware and states she has not been taking KCL for 2 weeks now but has been using a lot of salt substitute to help with potassium, have advised she needs labs to check K level, she is agreeable and order mailed to her so she can take it to [place] next week, as she has to go there for other labs

## 2013-04-18 NOTE — Telephone Encounter (Signed)
pls call pt: pls tell pt her cardiologist is prescribing potassium-she can call their ofc to change script. Also , her potassium was checked last mo when she went to oncology-does she want it checked again because she stopped taking potassium supplement?

## 2013-04-21 ENCOUNTER — Other Ambulatory Visit (HOSPITAL_COMMUNITY): Payer: Self-pay | Admitting: Internal Medicine

## 2013-04-22 NOTE — Telephone Encounter (Signed)
Received labs K 4.2 per Junie Bame, NP ok to stay off KCL pt aware and agreeable

## 2013-04-28 ENCOUNTER — Encounter: Payer: Self-pay | Admitting: Nurse Practitioner

## 2013-04-28 ENCOUNTER — Ambulatory Visit (INDEPENDENT_AMBULATORY_CARE_PROVIDER_SITE_OTHER): Payer: Medicare Other | Admitting: Nurse Practitioner

## 2013-04-28 VITALS — BP 110/70 | HR 90 | Temp 98.0°F | Ht 65.0 in | Wt 143.5 lb

## 2013-04-28 DIAGNOSIS — G4700 Insomnia, unspecified: Secondary | ICD-10-CM

## 2013-04-28 DIAGNOSIS — M129 Arthropathy, unspecified: Secondary | ICD-10-CM

## 2013-04-28 DIAGNOSIS — Z8739 Personal history of other diseases of the musculoskeletal system and connective tissue: Secondary | ICD-10-CM

## 2013-04-28 DIAGNOSIS — IMO0001 Reserved for inherently not codable concepts without codable children: Secondary | ICD-10-CM

## 2013-04-28 DIAGNOSIS — Z0000 Encounter for general adult medical examination without abnormal findings: Secondary | ICD-10-CM

## 2013-04-28 DIAGNOSIS — D509 Iron deficiency anemia, unspecified: Secondary | ICD-10-CM

## 2013-04-28 DIAGNOSIS — M1990 Unspecified osteoarthritis, unspecified site: Secondary | ICD-10-CM

## 2013-04-28 DIAGNOSIS — K219 Gastro-esophageal reflux disease without esophagitis: Secondary | ICD-10-CM

## 2013-04-28 DIAGNOSIS — M549 Dorsalgia, unspecified: Secondary | ICD-10-CM

## 2013-04-28 DIAGNOSIS — IMO0002 Reserved for concepts with insufficient information to code with codable children: Secondary | ICD-10-CM

## 2013-04-28 DIAGNOSIS — E1165 Type 2 diabetes mellitus with hyperglycemia: Secondary | ICD-10-CM

## 2013-04-28 DIAGNOSIS — E119 Type 2 diabetes mellitus without complications: Secondary | ICD-10-CM

## 2013-04-28 DIAGNOSIS — G8929 Other chronic pain: Secondary | ICD-10-CM

## 2013-04-28 LAB — URINALYSIS, ROUTINE W REFLEX MICROSCOPIC
Bilirubin Urine: NEGATIVE
HGB URINE DIPSTICK: NEGATIVE
Ketones, ur: NEGATIVE
LEUKOCYTES UA: NEGATIVE
NITRITE: NEGATIVE
RBC / HPF: NONE SEEN (ref 0–?)
Specific Gravity, Urine: 1.01 (ref 1.000–1.030)
Total Protein, Urine: NEGATIVE
URINE GLUCOSE: NEGATIVE
UROBILINOGEN UA: 0.2 (ref 0.0–1.0)
WBC, UA: NONE SEEN (ref 0–?)
pH: 5 (ref 5.0–8.0)

## 2013-04-28 LAB — MICROALBUMIN / CREATININE URINE RATIO
CREATININE, U: 31.4 mg/dL
MICROALB UR: 1.2 mg/dL (ref 0.0–1.9)
MICROALB/CREAT RATIO: 3.8 mg/g (ref 0.0–30.0)

## 2013-04-28 MED ORDER — TRAMADOL HCL 50 MG PO TABS: ORAL_TABLET | ORAL | Status: DC

## 2013-04-28 MED ORDER — LORAZEPAM 2 MG PO TABS: 2.0000 mg | ORAL_TABLET | Freq: Every day | ORAL | Status: DC

## 2013-04-28 MED ORDER — GLIPIZIDE 5 MG PO TABS: 5.0000 mg | ORAL_TABLET | Freq: Two times a day (BID) | ORAL | Status: DC

## 2013-04-28 NOTE — Patient Instructions (Addendum)
Consider getting shingles vaccine-go to any [facility] or [facility]. Continue metformin. Start glipizide 5 mg po qd. Decrease insulin to 13 u once daily. Check fasting blood sugars. If lower than 70, Decrease insulin to 6 units qd. If FBS continues to be over 180, increase glipizide to 10 mg daily. If still over 180, increase insulin by 2 u every 3 days, up to 30 units once daily.  Read over low blood sugar symptoms. If sugar under 70, drink glass orange juice with 1 TBL sugar, followed by handful nuts or 4 oz. Cheese. Take omeprazole 2 hrs before or after iron. Take iron on empty stomach with glass OJ or 500 mg Vit C.  Don't take gabapentin with iron as iron decreases strength of gabapentin.  Have fun in [place]!    Hypoglycemia (Low Blood Sugar) Hypoglycemia is when the glucose (sugar) in your blood is too low. Hypoglycemia can happen for many reasons. It can happen to people with or without diabetes. Hypoglycemia can develop quickly and can be a medical emergency.  CAUSES  Having hypoglycemia does not mean that you will develop diabetes. Different causes include:  Missed or delayed meals or not enough carbohydrates eaten.  Medication overdose. This could be by accident or deliberate. If by accident, your medication may need to be adjusted or changed.  Exercise or increased activity without adjustments in carbohydrates or medications.  A nerve disorder that affects body functions like your heart rate, blood pressure and digestion (autonomic neuropathy).  A condition where the stomach muscles do not function properly (gastroparesis). Therefore, medications may not absorb properly.  The inability to recognize the signs of hypoglycemia (hypoglycemic unawareness).  Absorption of insulin  may be altered.  Alcohol consumption.  Pregnancy/menstrual cycles/postpartum. This may be due to hormones.  Certain kinds of tumors. This is very rare. SYMPTOMS    Sweating.  Hunger.  Dizziness.  Blurred vision.  Drowsiness.  Weakness.  Headache.  Rapid heart beat.  Shakiness.  Nervousness. DIAGNOSIS  Diagnosis is made by monitoring blood glucose in one or all of the following ways:  Fingerstick blood glucose monitoring.  Laboratory results. TREATMENT  If you think your blood glucose is low:  Check your blood glucose, if possible. If it is less than 70 mg/dl, take one of the following:  3-4 glucose tablets.   cup juice (prefer clear like apple).   cup "regular" soda pop.  1 cup milk.  -1 tube of glucose gel.  5-6 hard candies.  Do not over treat because your blood glucose (sugar) will only go too high.  Wait 15 minutes and recheck your blood glucose. If it is still less than 70 mg/dl (or below your target range), repeat treatment.  Eat a snack if it is more than one hour until your next meal. Sometimes, your blood glucose may go so low that you are unable to treat yourself. You may need someone to help you. You may even pass out or be unable to swallow. This may require you to get an injection of glucagon, which raises the blood glucose. HOME CARE INSTRUCTIONS  Check blood glucose as recommended by your caregiver.  Take medication as prescribed by your caregiver.  Follow your meal plan. Do not skip meals. Eat on time.  If you are going to drink alcohol, drink it only with meals.  Check your blood glucose before driving.  Check your blood glucose before and after exercise. If you exercise longer or different than usual, be sure to  check blood glucose more frequently.  Always carry treatment with you. Glucose tablets are the easiest to carry.  Always wear medical alert jewelry or carry some form of identification that states that you have diabetes. This will alert people that you have diabetes. If you have hypoglycemia, they will have a better idea on what to do. SEEK MEDICAL CARE IF:   You are having  problems keeping your blood sugar at target range.  You are having frequent episodes of hypoglycemia.  You feel you might be having side effects from your medicines.  You have symptoms of an illness that is not improving after 3-4 days.  You notice a change in vision or a new problem with your vision. SEEK IMMEDIATE MEDICAL CARE IF:   You are a family member or friend of a person whose blood glucose goes below 70 mg/dl and is accompanied by:  Confusion.  A change in mental status.  The inability to swallow.  Passing out. Document Released: 02/13/2005 Document Revised: 05/08/2011 Document Reviewed: 06/12/2011 [facility] Patient Information 2014 [place], [place].

## 2013-04-28 NOTE — Progress Notes (Signed)
Pre-visit discussion using our clinic review tool. No additional management support is needed unless otherwise documented below in the visit note.  

## 2013-04-29 ENCOUNTER — Telehealth: Payer: Self-pay | Admitting: Nurse Practitioner

## 2013-04-29 ENCOUNTER — Other Ambulatory Visit (HOSPITAL_COMMUNITY): Payer: Self-pay | Admitting: Oncology

## 2013-04-29 ENCOUNTER — Other Ambulatory Visit: Payer: Self-pay | Admitting: Nurse Practitioner

## 2013-04-29 ENCOUNTER — Encounter: Payer: Self-pay | Admitting: Nurse Practitioner

## 2013-04-29 DIAGNOSIS — Z8739 Personal history of other diseases of the musculoskeletal system and connective tissue: Secondary | ICD-10-CM | POA: Insufficient documentation

## 2013-04-29 DIAGNOSIS — C50919 Malignant neoplasm of unspecified site of unspecified female breast: Secondary | ICD-10-CM

## 2013-04-29 DIAGNOSIS — Z0000 Encounter for general adult medical examination without abnormal findings: Secondary | ICD-10-CM | POA: Insufficient documentation

## 2013-04-29 DIAGNOSIS — M810 Age-related osteoporosis without current pathological fracture: Secondary | ICD-10-CM

## 2013-04-29 MED ORDER — OMEPRAZOLE 20 MG PO CPDR: 20.0000 mg | DELAYED_RELEASE_CAPSULE | Freq: Every morning | ORAL | Status: DC

## 2013-04-29 NOTE — Telephone Encounter (Signed)
Order placed for DEXA at [facility]

## 2013-04-29 NOTE — Progress Notes (Signed)
Subjective:     Emily [name] is a 53 y.o. female. She is considering moving to [facility] where she is from and has many family & friends. 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  Chief Complaint: Well Adult Physical  Patient here for a comprehensive physical exam.The patient reports problems - back pain, elevated FBS, ongoing insomnia  Do you take any herbs or supplements that were not prescribed by a doctor? no  Are you taking calcium supplements? yes  Are you taking aspirin daily? no  GU History: Hysterectomy. Not sure of last PAP or if has cervix.  Additional Complaints: Diabetes Mellitus Patient presents for follow up of diabetes. Current symptoms include: none. Symptoms have been basically asymptomatic. Patient denies foot ulcerations, hypoglycemia , increased appetite, nausea, polydipsia, polyuria, visual disturbances and vomiting. Evaluation to date has included: fasting blood sugar, fasting lipid panel, hemoglobin A1C and microalbuminuria.  Home sugars: BGs are high in the morning. Current treatment: weight loss of 30 lbs which has been ineffective and metformin and basal insulin at 26 u qd are somewhat effective, but FBS still over 180 & HgA1C 9.6. Pt is having trouble paying for meds & wants to restart glipizide-I stopped it due to potential for CHF exacerbation. GERD Paitent as no complaints of heartburn. She is currently taking omeprazole 40 mg.  Back Pain Patient presents for evaluation of chronic lumbar & thoracic back pain. Symptoms have been present for several years and include paresthesias in bilateral LE and mid & low back. Initial inciting event: none. Symptoms are worse after prolonged walking or cleaning house. Alleviating factors identifiable by the patient are rest. Previous work up: Dr Ace Gins. Previous treatments: lyrica, cymbalta, amitriptylline, tramadol, celebrex.  History of osteoporosis: was on fosamax in past.  Last DEXA 2 ya. Taking Vit D3. Insomnia: uses ativan & vistaril. Leg pain interferes with sleep. The following portions of the patient's history were reviewed and updated as appropriate: allergies, current medications, past family history, past medical history, past social history, past surgical history and problem list.  Review of Systems Pertinent items are noted in HPI.    Objective:    BP 110/70  Pulse 90  Temp(Src) 98 F (36.7 C) (Oral)  Ht 5\' 5"  (1.651 m)  Wt 143 lb 8 oz (65.091 kg)  BMI 23.88 kg/m2  SpO2 95% BP 110/70  Pulse 90  Temp(Src) 98 F (36.7 C) (Oral)  Ht 5\' 5"  (1.651 m)  Wt 143 lb 8 oz (65.091 kg)  BMI 23.88 kg/m2  SpO2 95% General appearance: alert, cooperative, appears older than stated age and no distress Head: Normocephalic, without obvious abnormality, atraumatic Back: no tenderness to percussion or palpation, mild kyphosis Lungs: occasional wheeze Heart: regular rate and rhythm, S1, S2 normal, no murmur, click, rub or gallop Extremities: no edema, redness or tenderness in the calves or thighs and thickened skin finger tips to dip joints Pulses: 2+ and symmetric    Assessment:     1. Back pain, chronic   2. Insomnia   3. Preventative health care   4. Diabetes   5. DM (diabetes mellitus), type 2, uncontrolled   6. GERD (gastroesophageal reflux disease)   7. Iron deficiency anemia   8. History of osteoporosis    9 Thickened skin fingers DDcutaneous scleroderma, morphea. Neg w/u for CREST, MCTD, SScl     Plan:     See problem list & pt instructions.

## 2013-04-29 NOTE — Telephone Encounter (Signed)
Patient has scheduled her mammo 05/28/13 8:45 at [facility] [phone], it's been 2 years since she had one done. Please enter an order for a bone density scan. Patient states they can do it the same day if we call & schedule & fax a signed order.

## 2013-04-29 NOTE — Assessment & Plan Note (Signed)
No current symptoms. Will decrease omeprazole to 20 mg qd.

## 2013-04-29 NOTE — Telephone Encounter (Signed)
Relevant patient education assigned to patient using Emmi. ° °

## 2013-04-29 NOTE — Assessment & Plan Note (Signed)
Pain worse with prolonged activity. Chronic RADICULAR SYMPTOMS IN LEGS. Historical Dx of RSD in R leg. Treated by Dr Sharol Given several ya. Fam Hx RA & osteogenesis imperfecta. Wants to try tramadol again. Will prescribe. Avoiding L.T. script for narcotics. Recmd natural anti-inflamms. Daily back stretches. Encourage consider seeing ortho again.

## 2013-04-29 NOTE — Assessment & Plan Note (Addendum)
Pt is having trouble paying for meds. She wants to start glipizide again because it is cheap. She is aware of potential for CHF exacerbation & still wants to re-start med. I have instructed her to continue metformin. Start glipizide 5 mg po qd. Decrease insulin to 13 u once daily. Check fasting blood sugars. If lower than 70, Decrease insulin to 6 units qd. If FBS continues to be over 180, increase glipizide to 10 mg daily. If still over 180, increase insulin by 2 u every 3 days, up to 30 units once daily. She is not symptomatic: denies shakiness, nausea, sweating, polyuria, dipsia, & phagia.

## 2013-04-29 NOTE — Assessment & Plan Note (Signed)
Check D3 level next visit. Order DEXA.

## 2013-04-29 NOTE — Assessment & Plan Note (Signed)
Iron studies next ov. Adv not to take with gabapentin or omeprazole.

## 2013-04-30 NOTE — Telephone Encounter (Signed)
Called pt. Advised to hold insulin tonight. Continue metformin. Take 5 mg glipizde in am , no insulin. Check FBS. If over 140, take 5 u insulin tomorrow night. If under 140, hold insulin, continue metformin & 5 mg glipizide qam.

## 2013-04-30 NOTE — Telephone Encounter (Signed)
Pt came in for OV appt 04/28/2013.

## 2013-04-30 NOTE — Telephone Encounter (Signed)
[facility], they are on EPIC so they have the order. They will contact patient for an appointment.

## 2013-04-30 NOTE — Telephone Encounter (Signed)
Called pt and gave her new pt advise. Pt wanted you to know she took her BS yesterday at lunch and it was 159. Pt took 7 units of levemir and 5 mg glipizide.  She went to an appointment and started feeling wuzzy, so she ate some candy. Candy made her feel better. When she got home she took her BS again and it was 126. Pt wanted you to know.

## 2013-05-01 ENCOUNTER — Other Ambulatory Visit: Payer: Self-pay | Admitting: *Deleted

## 2013-05-01 ENCOUNTER — Telehealth: Payer: Self-pay | Admitting: Nurse Practitioner

## 2013-05-01 MED ORDER — GLUCOSE BLOOD VI STRP: ORAL_STRIP | Status: DC

## 2013-05-01 NOTE — Telephone Encounter (Signed)
She should hold insulin and glipizide today. Take metformin today. Check FBS tomorrow am. If over 120 take glipizide & metformin, but not insulin.

## 2013-05-01 NOTE — Telephone Encounter (Signed)
Pt.notified

## 2013-05-01 NOTE — Telephone Encounter (Signed)
Resent rx

## 2013-05-01 NOTE — Telephone Encounter (Signed)
Patient called stated that her blood sugar is low. Her blood sugar was 74 this morning at 5:00am and now it is 107 at 8:45AM. Patient also stated that she has not taking her medication yet. She is concern about taking her medication after her reading this morning. Please advise.

## 2013-05-14 ENCOUNTER — Encounter: Payer: Self-pay | Admitting: Internal Medicine

## 2013-05-22 ENCOUNTER — Encounter: Payer: Self-pay | Admitting: Nurse Practitioner

## 2013-05-26 ENCOUNTER — Ambulatory Visit: Payer: Medicare Other | Admitting: Nurse Practitioner

## 2013-05-28 ENCOUNTER — Other Ambulatory Visit (HOSPITAL_COMMUNITY): Payer: Medicare Other

## 2013-05-28 ENCOUNTER — Encounter (HOSPITAL_COMMUNITY): Payer: Medicare Other

## 2013-06-02 ENCOUNTER — Encounter: Payer: Self-pay | Admitting: Family Medicine

## 2013-06-02 ENCOUNTER — Ambulatory Visit (INDEPENDENT_AMBULATORY_CARE_PROVIDER_SITE_OTHER): Payer: Medicare Other | Admitting: Family Medicine

## 2013-06-02 ENCOUNTER — Other Ambulatory Visit: Payer: Self-pay | Admitting: *Deleted

## 2013-06-02 VITALS — BP 92/63 | HR 91 | Temp 98.6°F | Ht 65.0 in | Wt 138.0 lb

## 2013-06-02 DIAGNOSIS — G8929 Other chronic pain: Secondary | ICD-10-CM

## 2013-06-02 DIAGNOSIS — G4700 Insomnia, unspecified: Secondary | ICD-10-CM

## 2013-06-02 DIAGNOSIS — M654 Radial styloid tenosynovitis [de Quervain]: Secondary | ICD-10-CM | POA: Insufficient documentation

## 2013-06-02 DIAGNOSIS — R0781 Pleurodynia: Secondary | ICD-10-CM

## 2013-06-02 DIAGNOSIS — M549 Dorsalgia, unspecified: Principal | ICD-10-CM

## 2013-06-02 DIAGNOSIS — R079 Chest pain, unspecified: Secondary | ICD-10-CM

## 2013-06-02 DIAGNOSIS — I959 Hypotension, unspecified: Secondary | ICD-10-CM

## 2013-06-02 NOTE — Progress Notes (Signed)
OFFICE NOTE  06/02/2013  CC: Co Chief Complaint  Patient presents with  . Wrist Pain     HPI: Patient is a 53 y.o. Caucasian female who is here for wrist pain.    1) 4-5 wks progressively worsening pain in right wrist near base of thumb.   If it is still then it doesn't hurt, but if moved or touched it hurts, sometimes severe pain.  No swelling or redness and no injury recalled.   Recalls hx of easily "spraining" wrists all her life but recalls not overuse/repetitive motion that brought this on.  No hx of this pain in the past.  2) Onset 05/03/13, ribs started hurting after an old friend hugged her and squeezed her very tight. This had improved until she recently had some luggage fall off luggage rack and fall onto anterior chest.  3) feeling lightheaded and generalized weakness on and off last 2d, esp yesterday, has noted bp is 90s over 60s more consistently, same as what we got here today.  Pt takes NO NSAIDS for pain and takes ultram and gabapentin for chronic back pain.   Pertinent PMH:  Past medical, surgical, social, and family history reviewed  MEDS:  Outpatient Prescriptions Prior to Visit  Medication Sig Dispense Refill  . albuterol (PROVENTIL HFA;VENTOLIN HFA) 108 (90 BASE) MCG/ACT inhaler Inhale 1-2 puffs into the lungs every 6 (six) hours as needed for wheezing or shortness of breath.      Marland Kitchen aspirin EC 81 MG tablet Take 1 tablet (81 mg total) by mouth daily.  90 tablet  3  . atorvastatin (LIPITOR) 20 MG tablet Take 1 tablet (20 mg total) by mouth daily.  90 tablet  3  . Blood Glucose Monitoring Suppl (ONE TOUCH ULTRA SYSTEM KIT) W/DEVICE KIT 1 kit by Does not apply route once.  1 each  0  . calcium-vitamin D (OSCAL WITH D) 500-200 MG-UNIT per tablet Take 1 tablet by mouth 2 (two) times daily.  180 tablet  3  . carvedilol (COREG) 6.25 MG tablet Take 1 tablet (6.25 mg total) by mouth 2 (two) times daily with a meal.  180 tablet  3  . Cyanocobalamin (VITAMIN B 12 PO) Take 1  tablet by mouth daily.      . diclofenac sodium (VOLTAREN) 1 % GEL Apply 1 application topically 4 (four) times daily as needed. Apply to painful areas on hands for arthritis      . digoxin (LANOXIN) 0.125 MG tablet TAKE ONE TABLET BY MOUTH ONCE DAILY  90 tablet  3  . enalapril (VASOTEC) 10 MG tablet Take 0.5-1 tablets (5-10 mg total) by mouth 2 (two) times daily. Take 80m in AM and 162min PM  180 tablet  3  . ferrous sulfate 325 (65 FE) MG tablet Take 1 tablet (325 mg total) by mouth at bedtime.  90 tablet  3  . furosemide (LASIX) 40 MG tablet Take 1 tablet (40 mg total) by mouth daily. Take extra tab if needed for wt gain  90 tablet  3  . gabapentin (NEURONTIN) 400 MG capsule Increase to 800 mg bid po for 2 weeks, then increase to 800 mg TID po  240 capsule  2  . glipiZIDE (GLUCOTROL) 5 MG tablet Take 1 tablet (5 mg total) by mouth 2 (two) times daily before a meal.  60 tablet  3  . glucose blood test strip One Touch Ultra Test Strips. Use 4 strips daily in meter. Dx. 250.00  100 each  1  . HYDROcodone-acetaminophen (NORCO/VICODIN) 5-325 MG per tablet Take 1 tablet by mouth every 6 (six) hours as needed for pain.  30 tablet  0  . HYDROcodone-acetaminophen (NORCO/VICODIN) 5-325 MG per tablet Take 1-2 tablets by mouth every 4 (four) hours as needed for moderate pain or severe pain.  50 tablet  0  . hydrOXYzine (ATARAX/VISTARIL) 25 MG tablet Take 1 tablet (25 mg total) by mouth at bedtime.  90 tablet  3  . Insulin Detemir (LEVEMIR) 100 UNIT/ML Pen Administer 13u Agency q12h -same time every day.  3 pen  1  . Insulin Pen Needle 32G X 6 MM MISC 1 Device by Does not apply route daily.  1 each  1  . KLOR-CON M20 20 MEQ tablet       . letrozole (FEMARA) 2.5 MG tablet Take 1 tablet (2.5 mg total) by mouth every morning.  90 tablet  2  . loperamide (IMODIUM) 2 MG capsule Take 2 mg by mouth daily as needed for diarrhea or loose stools.       Marland Kitchen LORazepam (ATIVAN) 2 MG tablet Take 1 tablet (2 mg total) by mouth  at bedtime. Do not fill before May 10, 2013.  30 tablet  2  . magnesium oxide (MAG-OX) 400 MG tablet Take 1 tablet (400 mg total) by mouth daily.  90 tablet  3  . metFORMIN (GLUCOPHAGE) 1000 MG tablet Take 1 tablet (1,000 mg total) by mouth 2 (two) times daily with a meal.  180 tablet  2  . omeprazole (PRILOSEC) 20 MG capsule Take 1 capsule (20 mg total) by mouth every morning.  90 capsule  3  . ondansetron (ZOFRAN) 4 MG tablet Take 1 tablet (4 mg total) by mouth 2 (two) times daily.  20 tablet  0  . spironolactone (ALDACTONE) 25 MG tablet TAKE ONE TABLET BY MOUTH ONCE DAILY  90 tablet  3  . traMADol (ULTRAM) 50 MG tablet Take 100 mg BID PRN pain. Start with 1T po qhs X 3d, then 1T po qam & qhs X 3d. May increase by 1T every 3 days until taking 2T BID.  120 tablet  0   No facility-administered medications prior to visit.    PE: Blood pressure 92/63, pulse 91, temperature 98.6 F (37 C), temperature source Temporal, height 5' 5"  (1.651 m), weight 138 lb (62.596 kg), SpO2 96.00%. Gen: Alert, well appearing.  Patient is oriented to person, place, time, and situation. Right wrist with tenderness over radial aspect of wrist over thumb extensors/abductors--right over the radial styloid region.  No erythema, warmth, or swelling.  Remainder of wrist nontender.  She also has pain in the area with right wrist extension>flexion. CV: RRR Lungs: aeration diminished diffusely but equal/symmetric. Nonlabored resps.  Ribs TTP diffusely in anterior lower rib region on right and left side, without crepitus.   IMPRESSION AND PLAN:  De Quervain's tenosynovitis, right Thumb spica splint rx'd for right wrist. No NSAIDs.   Rib pain Reassured pt but offered x-ray today. Ultimately we were comfortable with watchful waiting approach at this time.  Hypotension, unspecified Symptomatic. I encouraged pt to cut her enalapril dosing in half: take 1/4 tab qAM and 1/2 tab qPM. She sees Dr. Haroldine Laws in 3d, at  which time she'll further discuss this problem.   An After Visit Summary was printed and given to the patient.  FOLLOW UP: prn

## 2013-06-02 NOTE — Progress Notes (Signed)
Pre visit review using our clinic review tool, if applicable. No additional management support is needed unless otherwise documented below in the visit note. 

## 2013-06-02 NOTE — Assessment & Plan Note (Signed)
Symptomatic. I encouraged pt to cut her enalapril dosing in half: take 1/4 tab qAM and 1/2 tab qPM. She sees Dr. Haroldine Laws in 3d, at which time she'll further discuss this problem.

## 2013-06-02 NOTE — Telephone Encounter (Signed)
Patient is requesting 3 month supply for lorazepam and tramadol, [place]. Pt is leaving 06/18/2013 and wants time to set up pcp and pharmacy. Please advise? Pt stated that she is having bone scan 06/04/2013. Pt also wants handicap parking permit filled out. Pt left form with Sharyn Lull.

## 2013-06-02 NOTE — Assessment & Plan Note (Signed)
Reassured pt but offered x-ray today. Ultimately we were comfortable with watchful waiting approach at this time.

## 2013-06-02 NOTE — Assessment & Plan Note (Signed)
Thumb spica splint rx'd for right wrist. No NSAIDs.

## 2013-06-03 ENCOUNTER — Telehealth: Payer: Self-pay | Admitting: Family Medicine

## 2013-06-03 ENCOUNTER — Encounter (HOSPITAL_COMMUNITY): Payer: Self-pay | Admitting: Emergency Medicine

## 2013-06-03 ENCOUNTER — Telehealth: Payer: Self-pay | Admitting: Nurse Practitioner

## 2013-06-03 ENCOUNTER — Emergency Department (HOSPITAL_COMMUNITY)
Admission: EM | Admit: 2013-06-03 | Discharge: 2013-06-03 | Disposition: A | Discharge status: Home / Self Care | Payer: Medicare Other | Attending: Emergency Medicine | Admitting: Emergency Medicine

## 2013-06-03 ENCOUNTER — Emergency Department (HOSPITAL_COMMUNITY): Payer: Medicare Other

## 2013-06-03 DIAGNOSIS — F17200 Nicotine dependence, unspecified, uncomplicated: Secondary | ICD-10-CM | POA: Insufficient documentation

## 2013-06-03 DIAGNOSIS — F329 Major depressive disorder, single episode, unspecified: Secondary | ICD-10-CM | POA: Insufficient documentation

## 2013-06-03 DIAGNOSIS — M25439 Effusion, unspecified wrist: Secondary | ICD-10-CM | POA: Insufficient documentation

## 2013-06-03 DIAGNOSIS — IMO0001 Reserved for inherently not codable concepts without codable children: Secondary | ICD-10-CM | POA: Insufficient documentation

## 2013-06-03 DIAGNOSIS — D509 Iron deficiency anemia, unspecified: Secondary | ICD-10-CM | POA: Insufficient documentation

## 2013-06-03 DIAGNOSIS — F411 Generalized anxiety disorder: Secondary | ICD-10-CM | POA: Insufficient documentation

## 2013-06-03 DIAGNOSIS — K219 Gastro-esophageal reflux disease without esophagitis: Secondary | ICD-10-CM | POA: Insufficient documentation

## 2013-06-03 DIAGNOSIS — M129 Arthropathy, unspecified: Secondary | ICD-10-CM | POA: Insufficient documentation

## 2013-06-03 DIAGNOSIS — E1142 Type 2 diabetes mellitus with diabetic polyneuropathy: Secondary | ICD-10-CM | POA: Insufficient documentation

## 2013-06-03 DIAGNOSIS — M25531 Pain in right wrist: Secondary | ICD-10-CM

## 2013-06-03 DIAGNOSIS — Z7982 Long term (current) use of aspirin: Secondary | ICD-10-CM | POA: Insufficient documentation

## 2013-06-03 DIAGNOSIS — Z8619 Personal history of other infectious and parasitic diseases: Secondary | ICD-10-CM | POA: Insufficient documentation

## 2013-06-03 DIAGNOSIS — I10 Essential (primary) hypertension: Secondary | ICD-10-CM | POA: Insufficient documentation

## 2013-06-03 DIAGNOSIS — M25539 Pain in unspecified wrist: Secondary | ICD-10-CM | POA: Insufficient documentation

## 2013-06-03 DIAGNOSIS — Z79899 Other long term (current) drug therapy: Secondary | ICD-10-CM | POA: Insufficient documentation

## 2013-06-03 DIAGNOSIS — E785 Hyperlipidemia, unspecified: Secondary | ICD-10-CM | POA: Insufficient documentation

## 2013-06-03 DIAGNOSIS — I5022 Chronic systolic (congestive) heart failure: Secondary | ICD-10-CM | POA: Insufficient documentation

## 2013-06-03 DIAGNOSIS — E1149 Type 2 diabetes mellitus with other diabetic neurological complication: Secondary | ICD-10-CM | POA: Insufficient documentation

## 2013-06-03 DIAGNOSIS — F3289 Other specified depressive episodes: Secondary | ICD-10-CM | POA: Insufficient documentation

## 2013-06-03 MED ORDER — LORAZEPAM 2 MG PO TABS: 2.0000 mg | ORAL_TABLET | Freq: Every day | ORAL | Status: DC

## 2013-06-03 MED ORDER — IBUPROFEN 600 MG PO TABS: 600.0000 mg | ORAL_TABLET | Freq: Four times a day (QID) | ORAL | Status: DC | PRN

## 2013-06-03 MED ORDER — TRAMADOL HCL 50 MG PO TABS: ORAL_TABLET | ORAL | Status: DC

## 2013-06-03 NOTE — Telephone Encounter (Signed)
I recommend she wear the splint during the daytime, leave splint off at night when in bed sleeping.-thx

## 2013-06-03 NOTE — ED Notes (Signed)
Per pt sts right wrist pain and went to see her doctor yesterday but has gotten worse. sts he gave her a splint to wear. sts no xray

## 2013-06-03 NOTE — Discharge Instructions (Signed)
Arthralgia Your caregiver has diagnosed you as suffering from an arthralgia. Arthralgia means there is pain in a joint. This can come from many reasons including:  Bruising the joint which causes soreness (inflammation) in the joint.  Wear and tear on the joints which occur as we grow older (osteoarthritis).  Overusing the joint.  Various forms of arthritis.  Infections of the joint. Regardless of the cause of pain in your joint, most of these different pains respond to anti-inflammatory drugs and rest. The exception to this is when a joint is infected, and these cases are treated with antibiotics, if it is a bacterial infection. HOME CARE INSTRUCTIONS   Rest the injured area for as long as directed by your caregiver. Then slowly start using the joint as directed by your caregiver and as the pain allows. Crutches as directed may be useful if the ankles, knees or hips are involved. If the knee was splinted or casted, continue use and care as directed. If an stretchy or elastic wrapping bandage has been applied today, it should be removed and re-applied every 3 to 4 hours. It should not be applied tightly, but firmly enough to keep swelling down. Watch toes and feet for swelling, bluish discoloration, coldness, numbness or excessive pain. If any of these problems (symptoms) occur, remove the ace bandage and re-apply more loosely. If these symptoms persist, contact your caregiver or return to this location.  For the first 24 hours, keep the injured extremity elevated on pillows while lying down.  Wear any splinting, casting, elastic bandage applications, or slings as instructed.  Only take over-the-counter or prescription medicines for pain, discomfort, or fever as directed by your caregiver. Do not use aspirin immediately after the injury unless instructed by your physician. Aspirin can cause increased bleeding and bruising of the tissues.  If you were given crutches, continue to use them as  instructed and do not resume weight bearing on the sore joint until instructed. Persistent pain and inability to use the sore joint as directed for more than 2 to 3 days are warning signs indicating that you should see a caregiver for a follow-up visit as soon as possible. Initially, a hairline fracture (break in bone) may not be evident on X-rays. Persistent pain and swelling indicate that further evaluation, non-weight bearing or use of the joint (use of crutches or slings as instructed), or further X-rays are indicated. X-rays may sometimes not show a small fracture until a week or 10 days later. Make a follow-up appointment with your own caregiver or one to whom we have referred you. A radiologist (specialist in reading X-rays) may read your X-rays. Make sure you know how you are to obtain your X-ray results. Do not assume everything is normal if you do not hear from [place]. SEEK MEDICAL CARE IF: Bruising, swelling, or pain increases. SEEK IMMEDIATE MEDICAL CARE IF:   Your fingers or toes are numb or blue.  The pain is not responding to medications and continues to stay the same or get worse.  The pain in your joint becomes severe.  You develop a fever over 102 F (38.9 C).  It becomes impossible to move or use the joint. MAKE SURE YOU:   Understand these instructions.  Will watch your condition.  Will get help right away if you are not doing well or get worse. Document Released: 02/13/2005 Document Revised: 05/08/2011 Document Reviewed: 10/02/2007 [facility] Patient Information 2014 [place].   Your x-rays are normal today.  Please follow  your primary doctor's instructions for wearing your splint.  A heating pad applied for 20 minutes several times daily may also be helpful.  You may use ibuprofen which can also give additional pain relief.
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
## 2013-06-03 NOTE — Telephone Encounter (Signed)
Relevant patient education assigned to patient using Emmi. ° °

## 2013-06-03 NOTE — Telephone Encounter (Signed)
Patient [facility] stating that she got wrist splint and wore it last night and now her wrist hurts so bad she can barely move it.  Please advise any further plans.

## 2013-06-03 NOTE — Telephone Encounter (Signed)
Patient states that she is currently at hospital due to the wrist pain.

## 2013-06-04 ENCOUNTER — Telehealth: Payer: Self-pay | Admitting: Nurse Practitioner

## 2013-06-04 ENCOUNTER — Other Ambulatory Visit (HOSPITAL_COMMUNITY): Payer: Medicare Other

## 2013-06-04 NOTE — Telephone Encounter (Signed)
Relevant patient education assigned to patient using Emmi. ° °

## 2013-06-05 ENCOUNTER — Encounter (HOSPITAL_COMMUNITY): Payer: Self-pay

## 2013-06-05 ENCOUNTER — Ambulatory Visit (HOSPITAL_BASED_OUTPATIENT_CLINIC_OR_DEPARTMENT_OTHER)
Admission: RE | Admit: 2013-06-05 | Discharge: 2013-06-05 | Disposition: A | Discharge status: Home / Self Care | Payer: Medicare Other | Source: Ambulatory Visit | Attending: Internal Medicine | Admitting: Internal Medicine

## 2013-06-05 ENCOUNTER — Ambulatory Visit (HOSPITAL_COMMUNITY)
Admission: RE | Admit: 2013-06-05 | Discharge: 2013-06-05 | Disposition: A | Discharge status: Home / Self Care | Payer: Medicare Other | Source: Ambulatory Visit | Attending: Nurse Practitioner | Admitting: Nurse Practitioner

## 2013-06-05 VITALS — BP 90/60 | HR 82 | Wt 135.1 lb

## 2013-06-05 DIAGNOSIS — E119 Type 2 diabetes mellitus without complications: Secondary | ICD-10-CM | POA: Insufficient documentation

## 2013-06-05 DIAGNOSIS — I5022 Chronic systolic (congestive) heart failure: Secondary | ICD-10-CM

## 2013-06-05 DIAGNOSIS — I509 Heart failure, unspecified: Secondary | ICD-10-CM

## 2013-06-05 DIAGNOSIS — F17200 Nicotine dependence, unspecified, uncomplicated: Secondary | ICD-10-CM

## 2013-06-05 DIAGNOSIS — E785 Hyperlipidemia, unspecified: Secondary | ICD-10-CM | POA: Insufficient documentation

## 2013-06-05 DIAGNOSIS — I369 Nonrheumatic tricuspid valve disorder, unspecified: Secondary | ICD-10-CM

## 2013-06-05 DIAGNOSIS — I2510 Atherosclerotic heart disease of native coronary artery without angina pectoris: Secondary | ICD-10-CM | POA: Insufficient documentation

## 2013-06-05 MED ORDER — FUROSEMIDE 40 MG PO TABS: 40.0000 mg | ORAL_TABLET | ORAL | Status: DC | PRN

## 2013-06-05 MED ORDER — ENALAPRIL MALEATE 10 MG PO TABS: 5.0000 mg | ORAL_TABLET | Freq: Two times a day (BID) | ORAL | Status: DC

## 2013-06-05 NOTE — Progress Notes (Signed)
  Echocardiogram 2D Echocardiogram has been performed.  [place] 06/05/2013, 1:41 PM

## 2013-06-05 NOTE — Patient Instructions (Addendum)
Take lasix as needed  Stop digoxin  Take enalapril 5 mg twice a day   Follow up as needed  Do the following things EVERYDAY: 1) Weigh yourself in the morning before breakfast. Write it down and keep it in a log. 2) Take your medicines as prescribed 3) Eat low salt foods-Limit salt (sodium) to 2000 mg per day.  4) Stay as active as you can everyday 5) Limit all fluids for the day to less than 2 liters

## 2013-06-05 NOTE — Progress Notes (Signed)
Patient ID: Emily [name], female   DOB: [birth year], 53 y.o.   MRN: [id number] PCP: Dr Kathlen Mody Primary Cardiologist: Dr. Haroldine Laws  Emily [name] is a 52 yo woman with a history of ER + R breast cancer s/p R lumpectomy, DM2, cholelithiasis s/p cholecystectomy (02/2013), COPD and systolic CHF due to nonischemic cardiomyopathy.  Patient was admitted in 09/2012 with acute pulmonary edema and cardiogenic shock.  LHC showed moderate nonobstructive CAD (40-50% mLAD, 50-70% LCx, 50% PDA).  She was started on milrinone and diuresed.  Cause of cardiomyopathy suspected to be Adriamycin toxicity.    ECHO 10/27/12 EF 20-25% ECHO 01/09/13 EF 30-35% ECHO 06/05/13 EF 45-50% septum dysynnergic  12/05/12 Carotid Dopplers- R 39 % stenosis and L  40-59% stenosis  She returns for follow up. She saw Dr [name] earlier this week for R hand pain. BP at that time was low and she help evening enalapril. Denies SOB/PND/Orthopnea/CP.  Complains of bendopnea. Able to walk 1 mile. Denies dyspnea going up steps. Weight at home trending down from 140 to 135 pounds.  Smoking  1PPD. Taking all medications.   Labs (9/14): digoxin 0.4, K 3.6, creatinine 0.62 Labs (9/15): K 4.4 creatinine 0.68 Labs (10/26):    K 3.7 Cr 0.67 Labs: (03/20/2013): K+ 4.6, Cr 0.73  PMH: 1. COPD: PFTs (1/14) with FEV1 68%.  Quit smoking 9/14.  2. Type II diabetes  3. HTN 4. Breast cancer: 2009, s/p Adriamycin/Cytoxan/Taxol chemo, lumpectomy and radiation.   5. Nonischemic cardiomyopathy: Possibly due to Adriamycin.  Echo (8/14) with EF 20-25%, global hypokinesis, moderate diastolic dysfunction, normal RV size and with mild to moderately decreased systolic function, moderate TR.   6. CAD: LHC (8/14) with nonobstructive CAD; 40-50% mLAD, 50-70% LCx, 50% PDA.  7. Hyperlipidemia  SH: Lives alone in [place].  Mother is next door.  Quit smoking in 9/14.  FH: No premature CAD.  No family history of cardiomyopathy.   ROS: All systems reviewed and negative except  as per HPI.   Current Outpatient Prescriptions  Medication Sig Dispense Refill  . albuterol (PROVENTIL HFA;VENTOLIN HFA) 108 (90 BASE) MCG/ACT inhaler Inhale 1-2 puffs into the lungs every 6 (six) hours as needed for wheezing or shortness of breath.      Marland Kitchen aspirin EC 81 MG tablet Take 1 tablet (81 mg total) by mouth daily.  90 tablet  3  . atorvastatin (LIPITOR) 20 MG tablet Take 1 tablet (20 mg total) by mouth daily.  90 tablet  3  . calcium-vitamin D (OSCAL WITH D) 500-200 MG-UNIT per tablet Take 1 tablet by mouth 2 (two) times daily.  180 tablet  3  . carvedilol (COREG) 6.25 MG tablet Take 1 tablet (6.25 mg total) by mouth 2 (two) times daily with a meal.  180 tablet  3  . clobetasol cream (TEMOVATE) 8.56 % Apply 1 application topically daily.      . Cyanocobalamin (VITAMIN B 12 PO) Take 1 tablet by mouth daily.      . diclofenac sodium (VOLTAREN) 1 % GEL Apply 1 application topically 4 (four) times daily as needed. Apply to painful areas on hands for arthritis      . digoxin (LANOXIN) 0.125 MG tablet Take 0.125 mg by mouth every morning.      . enalapril (VASOTEC) 10 MG tablet Take 0.5-1 tablets (5-10 mg total) by mouth 2 (two) times daily. Take 5mg  in AM and 10mg  in PM  180 tablet  3  . ferrous sulfate 325 (65 FE) MG  tablet Take 1 tablet (325 mg total) by mouth at bedtime.  90 tablet  3  . furosemide (LASIX) 40 MG tablet Take 1 tablet (40 mg total) by mouth daily. Take extra tab if needed for wt gain  90 tablet  3  . gabapentin (NEURONTIN) 400 MG capsule Take 800 mg by mouth 3 (three) times daily.      Marland Kitchen glipiZIDE (GLUCOTROL) 5 MG tablet Take 1 tablet (5 mg total) by mouth 2 (two) times daily before a meal.  60 tablet  3  . HYDROcodone-acetaminophen (NORCO/VICODIN) 5-325 MG per tablet Take 1 tablet by mouth every 6 (six) hours as needed for pain.  30 tablet  0  . hydrOXYzine (ATARAX/VISTARIL) 25 MG tablet Take 1 tablet (25 mg total) by mouth at bedtime.  90 tablet  3  . ibuprofen  (ADVIL,MOTRIN) 600 MG tablet Take 1 tablet (600 mg total) by mouth every 6 (six) hours as needed.  30 tablet  0  . letrozole (FEMARA) 2.5 MG tablet Take 1 tablet (2.5 mg total) by mouth every morning.  90 tablet  2  . loperamide (IMODIUM) 2 MG capsule Take 2 mg by mouth daily as needed for diarrhea or loose stools.       Marland Kitchen LORazepam (ATIVAN) 2 MG tablet Take 1 tablet (2 mg total) by mouth at bedtime. Do not fill before May 10, 2013.  30 tablet  2  . magnesium oxide (MAG-OX) 400 MG tablet Take 1 tablet (400 mg total) by mouth daily.  90 tablet  3  . metFORMIN (GLUCOPHAGE) 1000 MG tablet Take 1 tablet (1,000 mg total) by mouth 2 (two) times daily with a meal.  180 tablet  2  . spironolactone (ALDACTONE) 25 MG tablet Take 25 mg by mouth every morning.      . traMADol (ULTRAM) 50 MG tablet Take 50 mg by mouth 2 (two) times daily.       No current facility-administered medications for this encounter.    Filed Vitals:   06/05/13 1344  BP: 90/60  Pulse: 82  Weight: 135 lb 1.6 oz (61.281 kg)  SpO2: 100%   General: NAD Neck: JVP flat, no thyromegaly or thyroid nodule.  Lungs: Clear to auscultation bilaterally with normal respiratory effort. Decreased BS throughout.  CV: Nondisplaced PMI.  Heart regular S1/S2, no S3/S4, no murmur.  No peripheral edema.  No carotid bruit.  Normal pedal pulses.  Abdomen: Soft, mildly tender, no hepatosplenomegaly, no distention. R scar Skin: Intact without lesions or rashes.  Neurologic: Alert and oriented x 3.  Psych: Normal affect. Extremities: No clubbing or cyanosis.  HEENT: Normal.   Assessment/Plan: 1. Chronic systolic CHF: NICM, likely Adriamycin toxicity, EF 30-35% (12/2012). Dr Haroldine Laws discussed and reviewed ECHO. EF improved to 45-50%  - NYHA I  symptoms and volume status stable. Volume status low. Switch to prn lasix.  - Continue coreg 6.25 mg BID, will not titrate with fatigue and hypotension.Stop digoxin - Cut back enalapril to 5 mg bid.  -  Reinforced the need and importance of daily weights, a low sodium diet, and fluid restriction (less than 2 L a day). Instructed to call the HF clinic if weight increases more than 3 lbs overnight or 5 lbs in a week.  2. CAD: Moderate nonobstructive disease.  Continue ASA 81, statin and BB.  3. COPD: Still smoking. I have encouraged her to stop once again 4. Current smoker- Encouraged to stop smoking she declines smoking cessation.  4. Breast Cancer: received  adriamycin. Cancer free for 5 years.   Follow up as needed. Will need to establish with cardiology in [place].  Amy D Clegg NP-C 06/05/2013  Patient seen and examined with Darrick Grinder, NP. We discussed all aspects of the encounter. I agree with the assessment and plan as stated above. I reviewed echo personally. EF now near normal. She is NYHA I.  With low BP will cut lisinopril back and stop digoxin. Can use lasix on prn basis. We provided her with name of HF cardiologist at [facility] to f/u. Given h/o adriamycin would continue ACE & b-blocker indefinitely and repeat echo in 1 year. Encouraged smoking cessation.   Shaune Pascal Bensimhon,MD 10:29 AM

## 2013-06-05 NOTE — ED Provider Notes (Signed)
Medical screening examination/treatment/procedure(s) were performed by non-physician practitioner and as supervising physician I was immediately available for consultation/collaboration.   EKG Interpretation None        Ephraim Hamburger, MD 06/05/13 [phone]

## 2013-06-06 ENCOUNTER — Telehealth: Payer: Self-pay | Admitting: Nurse Practitioner

## 2013-06-06 DIAGNOSIS — IMO0002 Reserved for concepts with insufficient information to code with codable children: Secondary | ICD-10-CM

## 2013-06-06 DIAGNOSIS — E1165 Type 2 diabetes mellitus with hyperglycemia: Secondary | ICD-10-CM

## 2013-06-06 DIAGNOSIS — M654 Radial styloid tenosynovitis [de Quervain]: Secondary | ICD-10-CM

## 2013-06-06 NOTE — Telephone Encounter (Signed)
Spoke with pt, she is taking Glipizide and Levemir for her diabetes. Informed her we will not be able to sign parking permit.

## 2013-06-09 ENCOUNTER — Encounter: Payer: Self-pay | Admitting: Nurse Practitioner

## 2013-06-09 ENCOUNTER — Ambulatory Visit (INDEPENDENT_AMBULATORY_CARE_PROVIDER_SITE_OTHER): Payer: Medicare Other | Admitting: Nurse Practitioner

## 2013-06-09 VITALS — BP 90/58 | HR 73 | Temp 98.0°F | Ht 65.0 in | Wt 139.8 lb

## 2013-06-09 DIAGNOSIS — G4700 Insomnia, unspecified: Secondary | ICD-10-CM

## 2013-06-09 DIAGNOSIS — M549 Dorsalgia, unspecified: Secondary | ICD-10-CM

## 2013-06-09 DIAGNOSIS — M654 Radial styloid tenosynovitis [de Quervain]: Secondary | ICD-10-CM

## 2013-06-09 DIAGNOSIS — R42 Dizziness and giddiness: Secondary | ICD-10-CM

## 2013-06-09 DIAGNOSIS — D509 Iron deficiency anemia, unspecified: Secondary | ICD-10-CM

## 2013-06-09 DIAGNOSIS — R197 Diarrhea, unspecified: Secondary | ICD-10-CM | POA: Insufficient documentation

## 2013-06-09 DIAGNOSIS — R634 Abnormal weight loss: Secondary | ICD-10-CM | POA: Insufficient documentation

## 2013-06-09 DIAGNOSIS — IMO0002 Reserved for concepts with insufficient information to code with codable children: Secondary | ICD-10-CM

## 2013-06-09 DIAGNOSIS — Z8739 Personal history of other diseases of the musculoskeletal system and connective tissue: Secondary | ICD-10-CM

## 2013-06-09 DIAGNOSIS — E1165 Type 2 diabetes mellitus with hyperglycemia: Secondary | ICD-10-CM

## 2013-06-09 DIAGNOSIS — L299 Pruritus, unspecified: Secondary | ICD-10-CM

## 2013-06-09 DIAGNOSIS — G8929 Other chronic pain: Secondary | ICD-10-CM

## 2013-06-09 DIAGNOSIS — M792 Neuralgia and neuritis, unspecified: Secondary | ICD-10-CM

## 2013-06-09 DIAGNOSIS — IMO0001 Reserved for inherently not codable concepts without codable children: Secondary | ICD-10-CM

## 2013-06-09 LAB — CBC
HCT: 39.5 % (ref 36.0–46.0)
Hemoglobin: 13 g/dL (ref 12.0–15.0)
MCHC: 33 g/dL (ref 30.0–36.0)
MCV: 91.1 fl (ref 78.0–100.0)
Platelets: 175 10*3/uL (ref 150.0–400.0)
RBC: 4.33 Mil/uL (ref 3.87–5.11)
RDW: 13.2 % (ref 11.5–14.6)
WBC: 9.5 10*3/uL (ref 4.5–10.5)

## 2013-06-09 LAB — POCT URINALYSIS DIPSTICK
Bilirubin, UA: NEGATIVE
Blood, UA: NEGATIVE
Glucose, UA: NEGATIVE
Ketones, UA: NEGATIVE
Leukocytes, UA: NEGATIVE
NITRITE UA: NEGATIVE
PH UA: 5
Protein, UA: NEGATIVE
SPEC GRAV UA: 1.025
UROBILINOGEN UA: 0.2

## 2013-06-09 LAB — COMPREHENSIVE METABOLIC PANEL
ALBUMIN: 3.8 g/dL (ref 3.5–5.2)
ALT: 21 U/L (ref 0–35)
AST: 20 U/L (ref 0–37)
Alkaline Phosphatase: 39 U/L (ref 39–117)
BUN: 19 mg/dL (ref 6–23)
CHLORIDE: 105 meq/L (ref 96–112)
CO2: 24 meq/L (ref 19–32)
Calcium: 9.3 mg/dL (ref 8.4–10.5)
Creatinine, Ser: 0.7 mg/dL (ref 0.4–1.2)
GFR: 95.53 mL/min (ref >60.00)
GLUCOSE: 68 mg/dL — AB (ref 70–99)
POTASSIUM: 3.9 meq/L (ref 3.5–5.1)
SODIUM: 140 meq/L (ref 135–145)
TOTAL PROTEIN: 6.7 g/dL (ref 6.0–8.3)
Total Bilirubin: 0.3 mg/dL (ref 0.3–1.2)

## 2013-06-09 LAB — LIPID PANEL
CHOL/HDL RATIO: 3
Cholesterol: 103 mg/dL (ref 0–200)
HDL: 37.6 mg/dL — AB (ref >39.00)
LDL Cholesterol: 36 mg/dL (ref 0–99)
TRIGLYCERIDES: 145 mg/dL (ref 0.0–149.0)
VLDL: 29 mg/dL (ref 0.0–40.0)

## 2013-06-09 LAB — FERRITIN: Ferritin: 31.2 ng/mL (ref 10.0–291.0)

## 2013-06-09 LAB — TSH: TSH: 1.33 u[IU]/mL (ref 0.35–5.50)

## 2013-06-09 LAB — VITAMIN B12: VITAMIN B 12: 261 pg/mL (ref 211–911)

## 2013-06-09 MED ORDER — METFORMIN HCL 1000 MG PO TABS: 1000.0000 mg | ORAL_TABLET | Freq: Two times a day (BID) | ORAL | Status: DC

## 2013-06-09 MED ORDER — TRAMADOL HCL 50 MG PO TABS: 50.0000 mg | ORAL_TABLET | Freq: Two times a day (BID) | ORAL | Status: DC

## 2013-06-09 MED ORDER — GABAPENTIN 400 MG PO CAPS: 800.0000 mg | ORAL_CAPSULE | Freq: Three times a day (TID) | ORAL | Status: DC

## 2013-06-09 MED ORDER — LORAZEPAM 2 MG PO TABS
2.0000 mg | ORAL_TABLET | Freq: Every day | ORAL | Status: DC
Start: 2013-06-09 — End: 2013-10-13

## 2013-06-09 MED ORDER — HYDROXYZINE HCL 25 MG PO TABS: 25.0000 mg | ORAL_TABLET | Freq: Every day | ORAL | Status: DC

## 2013-06-09 MED ORDER — GLIPIZIDE 5 MG PO TABS: 5.0000 mg | ORAL_TABLET | Freq: Two times a day (BID) | ORAL | Status: DC

## 2013-06-09 MED ORDER — HYDROCODONE-ACETAMINOPHEN 5-325 MG PO TABS: 1.0000 | ORAL_TABLET | Freq: Four times a day (QID) | ORAL | Status: DC | PRN

## 2013-06-09 MED ORDER — LOPERAMIDE HCL 2 MG PO CAPS: 2.0000 mg | ORAL_CAPSULE | Freq: Every day | ORAL | Status: DC | PRN

## 2013-06-09 MED ORDER — PREDNISONE 5 MG PO TABS: ORAL_TABLET | ORAL | Status: DC

## 2013-06-09 NOTE — Patient Instructions (Addendum)
Our office will call with lab results. Take glipizide once daily. Continue metformin. Continue to check fasting sugars about twice/week. Goal is 70-130. Weigh daily. If you gain 3 or more pounds in a 24 hour period take 1 dose lasix. Eat foods high in fiber-fruits & veggies, whole grains. Also foods with pectin, like apple sauce & fresh apples. (No apple juice). This will help with diarrhea and lower blood sugar. Use imodium as needed for diarrhea. I am concerned that pain in feet is related to arthritis in back.  Get established with rheumatologist when you get to [place]. Although your blood work does not show scleroderma, you have some symptoms.  Continue to wear wrist brace to rest tendons. You may have inflammation in tendon sheath. Use heat several times daily. Apply voltaren gel 4 times daily. Start prednisone. Get established with cardiologist. Continue oral B complex, liquid, 1 dropperful daily.  We will miss you!

## 2013-06-09 NOTE — Assessment & Plan Note (Signed)
Likely r/t cholecystectomy. Use imodium PRN. Eat apple sauce. Avoid apple juice. Sip fluids every hour to avoid dehydration. CMET today.

## 2013-06-09 NOTE — Assessment & Plan Note (Signed)
Continue lipitor. Lipids today.

## 2013-06-09 NOTE — Telephone Encounter (Signed)
Left detailed message for pt to return call.  

## 2013-06-09 NOTE — Progress Notes (Signed)
Pre visit review using our clinic review tool, if applicable. No additional management support is needed unless otherwise documented below in the visit note. 

## 2013-06-09 NOTE — Assessment & Plan Note (Signed)
Continue ativan & hydroxyzine. Use tramadol for pain. Discussed sleep hygiene.

## 2013-06-09 NOTE — Assessment & Plan Note (Addendum)
Continue metformin & glipizide. A1C needs to be rechecked in 1 mo. Continue lipitor.  BP well controlled. Encourage fiber in diet. Neuropathy may not be r/t diabetes-may be arthropathy/enesthitis, tendonitis.

## 2013-06-09 NOTE — Assessment & Plan Note (Signed)
Iron studies today.  

## 2013-06-09 NOTE — Assessment & Plan Note (Signed)
Likely r/t hypotension. DD:  Dehydration (several days diarrhea), medication SE, thyroid disease, B12 deficiency  Urine SG, cmet, cbc, TSH, Vit B

## 2013-06-09 NOTE — Telephone Encounter (Signed)
b 12 low-will administer daily injections for next 4 days starting tomorrow, then she will take oral supplement. Low glucose-decrease glipizide to qd Advised to increase fluids-urine SG 1.025 Apply voltaren gel 4 times daily to R wrist, wear splint, heat, prednisone. See rheum when get to [facility]. Reviewed case w/Dr Trudie Reed. Scleroderma still possibility although seroneg-may be cause for HF. Discussed all with pt. Answered all questions.

## 2013-06-09 NOTE — Assessment & Plan Note (Signed)
Continued pain & mild swelling.  Continue to wear splint use heat several times daily. Apply voltaren gel twice daily. Call placed to Dr Franchot Erichsen request she see pt for possible steroid injection if has availability this week.

## 2013-06-09 NOTE — Assessment & Plan Note (Signed)
Pt c/o anorexia. Recent GB surgery. No weight gain since surgery. Nausea resolved. Continue to monitor.

## 2013-06-09 NOTE — Assessment & Plan Note (Signed)
Corticated SI joints-AS? Diabetic neuropathy? Pain never relieved. Tramadol & gabapentin help. Use voltaren gel on feet. Check Vit D level & B12.

## 2013-06-09 NOTE — Assessment & Plan Note (Signed)
Vit D level today. Continue to get 1200 mg calcium through foods. Limit caffeine & ETOH.  Waiting for DEXA results.

## 2013-06-10 ENCOUNTER — Telehealth: Payer: Self-pay | Admitting: Nurse Practitioner

## 2013-06-10 ENCOUNTER — Other Ambulatory Visit: Payer: Self-pay | Admitting: *Deleted

## 2013-06-10 ENCOUNTER — Ambulatory Visit (INDEPENDENT_AMBULATORY_CARE_PROVIDER_SITE_OTHER): Payer: Medicare Other | Admitting: *Deleted

## 2013-06-10 DIAGNOSIS — M792 Neuralgia and neuritis, unspecified: Secondary | ICD-10-CM

## 2013-06-10 DIAGNOSIS — R197 Diarrhea, unspecified: Secondary | ICD-10-CM

## 2013-06-10 DIAGNOSIS — L299 Pruritus, unspecified: Secondary | ICD-10-CM

## 2013-06-10 DIAGNOSIS — IMO0002 Reserved for concepts with insufficient information to code with codable children: Secondary | ICD-10-CM

## 2013-06-10 DIAGNOSIS — E538 Deficiency of other specified B group vitamins: Secondary | ICD-10-CM

## 2013-06-10 DIAGNOSIS — E1165 Type 2 diabetes mellitus with hyperglycemia: Secondary | ICD-10-CM

## 2013-06-10 LAB — VITAMIN D 25 HYDROXY (VIT D DEFICIENCY, FRACTURES): VIT D 25 HYDROXY: 32 ng/mL (ref 30–89)

## 2013-06-10 LAB — IRON AND TIBC
%SAT: 22 % (ref 20–55)
IRON: 77 ug/dL (ref 42–145)
TIBC: 347 ug/dL (ref 250–470)
UIBC: 270 ug/dL (ref 125–400)

## 2013-06-10 MED ORDER — HYDROXYZINE HCL 25 MG PO TABS: 25.0000 mg | ORAL_TABLET | Freq: Every day | ORAL | Status: DC

## 2013-06-10 MED ORDER — HYDROCODONE-ACETAMINOPHEN 5-325 MG PO TABS: 1.0000 | ORAL_TABLET | Freq: Four times a day (QID) | ORAL | Status: DC | PRN

## 2013-06-10 MED ORDER — GLIPIZIDE 5 MG PO TABS: 5.0000 mg | ORAL_TABLET | Freq: Two times a day (BID) | ORAL | Status: DC

## 2013-06-10 MED ORDER — LOPERAMIDE HCL 2 MG PO CAPS: 2.0000 mg | ORAL_CAPSULE | Freq: Every day | ORAL | Status: DC | PRN

## 2013-06-10 MED ORDER — METFORMIN HCL 1000 MG PO TABS: 1000.0000 mg | ORAL_TABLET | Freq: Two times a day (BID) | ORAL | Status: DC

## 2013-06-10 MED ORDER — TRAMADOL HCL 50 MG PO TABS: 50.0000 mg | ORAL_TABLET | Freq: Two times a day (BID) | ORAL | Status: DC

## 2013-06-10 MED ORDER — CYANOCOBALAMIN 1000 MCG/ML IJ SOLN
1000.0000 ug | Freq: Once | INTRAMUSCULAR | Status: AC
  Administered 2013-06-10: 1000 ug via INTRAMUSCULAR

## 2013-06-10 NOTE — Telephone Encounter (Signed)
Patient will stop by for first injection sometime today. Pt was not sure when she would come in.

## 2013-06-10 NOTE — Telephone Encounter (Signed)
Relevant patient education assigned to patient using Emmi. ° °

## 2013-06-10 NOTE — Telephone Encounter (Signed)
Resent rx's for pt to [facility]. Phone in.

## 2013-06-11 ENCOUNTER — Ambulatory Visit (INDEPENDENT_AMBULATORY_CARE_PROVIDER_SITE_OTHER): Payer: Medicare Other | Admitting: Family Medicine

## 2013-06-11 ENCOUNTER — Encounter: Payer: Self-pay | Admitting: Nurse Practitioner

## 2013-06-11 DIAGNOSIS — E538 Deficiency of other specified B group vitamins: Secondary | ICD-10-CM

## 2013-06-11 MED ORDER — CYANOCOBALAMIN 1000 MCG/ML IJ SOLN
1000.0000 ug | Freq: Once | INTRAMUSCULAR | Status: AC
  Administered 2013-06-11: 1000 ug via INTRAMUSCULAR
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
## 2013-06-12 ENCOUNTER — Ambulatory Visit: Payer: Medicare Other

## 2013-06-12 DIAGNOSIS — E538 Deficiency of other specified B group vitamins: Secondary | ICD-10-CM

## 2013-06-12 MED ORDER — CYANOCOBALAMIN 1000 MCG/ML IJ SOLN
1000.0000 ug | Freq: Once | INTRAMUSCULAR | Status: AC
  Administered 2013-06-12: 1000 ug via INTRAMUSCULAR

## 2013-06-13 ENCOUNTER — Ambulatory Visit: Payer: Medicare Other

## 2013-06-13 ENCOUNTER — Ambulatory Visit (INDEPENDENT_AMBULATORY_CARE_PROVIDER_SITE_OTHER): Payer: Medicare Other

## 2013-06-13 DIAGNOSIS — E538 Deficiency of other specified B group vitamins: Secondary | ICD-10-CM

## 2013-06-13 MED ORDER — CYANOCOBALAMIN 1000 MCG/ML IJ SOLN
1000.0000 ug | Freq: Once | INTRAMUSCULAR | Status: AC
  Administered 2013-06-13: 1000 ug via INTRAMUSCULAR

## 2013-06-25 ENCOUNTER — Encounter (HOSPITAL_COMMUNITY): Payer: Medicare Other

## 2013-07-11 ENCOUNTER — Encounter: Payer: Self-pay | Admitting: Nurse Practitioner

## 2013-07-18 ENCOUNTER — Telehealth: Payer: Self-pay | Admitting: *Deleted

## 2013-07-18 NOTE — Telephone Encounter (Signed)
Called pt to tell her to ask the Rheumatologist to contact our office to request her records when she makes an appointment with them.

## 2013-08-28 LAB — BASIC METABOLIC PANEL
BUN/Creatinine Ratio: 21 (ref 9–23)
BUN: 14 mg/dL (ref 6–24)
CALCIUM: 9.7 mg/dL (ref 8.7–10.2)
CO2: 29 mmol/L (ref 18–29)
CREATININE: 0.68 mg/dL (ref 0.57–1.00)
Chloride: 95 mmol/L — ABNORMAL LOW (ref 96–108)
GFR calc Af Amer: 115 mL/min/{1.73_m2} (ref >59)
GFR calc non Af Amer: 99 mL/min/{1.73_m2} (ref >59)
GLUCOSE: 201 mg/dL — AB (ref 65–99)
Potassium: 4.2 mmol/L (ref 3.5–5.2)
Sodium: 136 mmol/L (ref 134–144)

## 2013-09-05 ENCOUNTER — Encounter: Payer: Self-pay | Admitting: Nurse Practitioner

## 2013-09-08 ENCOUNTER — Telehealth: Payer: Self-pay | Admitting: *Deleted

## 2013-09-08 NOTE — Telephone Encounter (Signed)
Copy of pt's last ECG was mailed to her new home address.

## 2013-09-08 NOTE — Telephone Encounter (Signed)
Message copied by Julieta Bellini on Mon Sep 08, 2013  2:00 PM ------      Message from: WEAVER, [place] [facility]      Created: Mon Sep 08, 2013  1:42 PM       Please copy Emily [name]'s last ECG & mail to her.      Thanks! ------

## 2013-09-17 ENCOUNTER — Ambulatory Visit (HOSPITAL_COMMUNITY): Payer: Medicare Other

## 2013-10-13 ENCOUNTER — Telehealth: Payer: Self-pay

## 2013-10-13 ENCOUNTER — Other Ambulatory Visit: Payer: Self-pay | Admitting: Nurse Practitioner

## 2013-10-13 DIAGNOSIS — F329 Major depressive disorder, single episode, unspecified: Secondary | ICD-10-CM

## 2013-10-13 DIAGNOSIS — F32A Depression, unspecified: Secondary | ICD-10-CM

## 2013-10-13 DIAGNOSIS — G4700 Insomnia, unspecified: Secondary | ICD-10-CM

## 2013-10-13 NOTE — Telephone Encounter (Signed)
Pt called from [place] requesting a refill on her Lorazepam. She is having trouble finding a PCP in [place]. She also states she is having episodes of crying and was wondering if she needs something for depression. She stopped her Zoloft. Pt has made an appt to see Layne on 10/24/2013. She will be in town that day. Please advise.

## 2013-10-13 NOTE — Telephone Encounter (Addendum)
Patient stated that she would like to start Zoloft until she sees you for ov. Patient is requesting 90 day supply. Patient stated that rx's are cheaper with 90 supply in [place].

## 2013-10-13 NOTE — Telephone Encounter (Signed)
Yes, I will refill ativan. Does she want to start on Zoloft? If so, I will prescribe. I would like to get last OV note from last provider she saw in [place]. Please ask for name of provider & have [facility] request record.

## 2013-10-14 ENCOUNTER — Telehealth: Payer: Self-pay | Admitting: *Deleted

## 2013-10-14 MED ORDER — LORAZEPAM 2 MG PO TABS: 2.0000 mg | ORAL_TABLET | Freq: Every day | ORAL | Status: DC

## 2013-10-14 MED ORDER — SERTRALINE HCL 50 MG PO TABS: 50.0000 mg | ORAL_TABLET | Freq: Every day | ORAL | Status: DC

## 2013-10-14 NOTE — Telephone Encounter (Signed)
Patient is requesting a muscle relaxer. Patient stated that her neck has been hurting since she moved her furniture yesterday. Patient stated that neck is aching and she has tingling sensation in her left arm. Patient wanted to know if you would give her around 10 pills of a muscle relaxer? Please advise?

## 2013-10-14 NOTE — Telephone Encounter (Signed)
Patient's pharmacy in [place] called back about rx's that were faxed in earlier this morning, stating that the supervising physician will have sign the rx for Lorazepam.

## 2013-10-15 NOTE — Telephone Encounter (Signed)
Please ensure both muscle relaxer & ativan been signed by Dr Anitra Lauth & sent to pharm. Thanks.

## 2013-10-15 NOTE — Telephone Encounter (Signed)
LMOVM for pt to return call 

## 2013-10-24 ENCOUNTER — Ambulatory Visit (INDEPENDENT_AMBULATORY_CARE_PROVIDER_SITE_OTHER): Payer: Medicare Other | Admitting: Nurse Practitioner

## 2013-10-24 ENCOUNTER — Encounter: Payer: Self-pay | Admitting: Nurse Practitioner

## 2013-10-24 VITALS — BP 99/66 | HR 74 | Temp 97.2°F | Resp 16 | Ht 65.0 in | Wt 139.0 lb

## 2013-10-24 DIAGNOSIS — Z23 Encounter for immunization: Secondary | ICD-10-CM

## 2013-10-24 DIAGNOSIS — R3911 Hesitancy of micturition: Secondary | ICD-10-CM

## 2013-10-24 DIAGNOSIS — M659 Synovitis and tenosynovitis, unspecified: Secondary | ICD-10-CM

## 2013-10-24 DIAGNOSIS — E119 Type 2 diabetes mellitus without complications: Secondary | ICD-10-CM

## 2013-10-24 DIAGNOSIS — M65849 Other synovitis and tenosynovitis, unspecified hand: Secondary | ICD-10-CM

## 2013-10-24 DIAGNOSIS — L988 Other specified disorders of the skin and subcutaneous tissue: Secondary | ICD-10-CM

## 2013-10-24 DIAGNOSIS — G8929 Other chronic pain: Secondary | ICD-10-CM

## 2013-10-24 DIAGNOSIS — R42 Dizziness and giddiness: Secondary | ICD-10-CM

## 2013-10-24 DIAGNOSIS — R238 Other skin changes: Secondary | ICD-10-CM

## 2013-10-24 DIAGNOSIS — Z862 Personal history of diseases of the blood and blood-forming organs and certain disorders involving the immune mechanism: Secondary | ICD-10-CM

## 2013-10-24 DIAGNOSIS — I7300 Raynaud's syndrome without gangrene: Secondary | ICD-10-CM

## 2013-10-24 DIAGNOSIS — R234 Changes in skin texture: Secondary | ICD-10-CM

## 2013-10-24 DIAGNOSIS — M65839 Other synovitis and tenosynovitis, unspecified forearm: Secondary | ICD-10-CM

## 2013-10-24 DIAGNOSIS — M533 Sacrococcygeal disorders, not elsewhere classified: Secondary | ICD-10-CM

## 2013-10-24 DIAGNOSIS — F329 Major depressive disorder, single episode, unspecified: Secondary | ICD-10-CM

## 2013-10-24 DIAGNOSIS — F3289 Other specified depressive episodes: Secondary | ICD-10-CM

## 2013-10-24 DIAGNOSIS — M65939 Unspecified synovitis and tenosynovitis, unspecified forearm: Secondary | ICD-10-CM

## 2013-10-24 LAB — IRON AND TIBC
%SAT: 30 % (ref 20–55)
IRON: 117 ug/dL (ref 42–145)
TIBC: 390 ug/dL (ref 250–470)
UIBC: 273 ug/dL (ref 125–400)

## 2013-10-24 LAB — HEMOGLOBIN A1C: HEMOGLOBIN A1C: 6.3 % (ref 4.6–6.5)

## 2013-10-24 LAB — MAGNESIUM: Magnesium: 1.7 mg/dL (ref 1.5–2.5)

## 2013-10-24 LAB — FERRITIN: Ferritin: 31.9 ng/mL (ref 10.0–291.0)

## 2013-10-24 MED ORDER — TRAMADOL HCL (ER BIPHASIC) 100 MG PO CP24: 100.0000 mg | ORAL_CAPSULE | Freq: Two times a day (BID) | ORAL | Status: DC

## 2013-10-24 MED ORDER — GLUCOSE BLOOD VI STRP: ORAL_STRIP | Status: DC

## 2013-10-24 NOTE — Patient Instructions (Addendum)
Start tramadol for pain. Take once daily for 5 days, then increase to twice daily.  Keep salt under 2400gm/day.  I will call Dr Trudie Reed for evaluation of wrist & back pain.  I am not sure what is causing blisters, possibly pemphigoid-autoimmune disorder.  Continue oral B complex.  Decrease vitamin D3 to 1000 iu daily with meal.  See Dr Haroldine Laws.  We will call with lab results.  Keep working on getting established with PCP in [place].  Plan on seeing me again in 1 month to evaluate response to zoloft.  Great to see you!

## 2013-10-24 NOTE — Progress Notes (Signed)
Pre visit review using our clinic review tool, if applicable. No additional management support is needed unless otherwise documented below in the visit note. 

## 2013-10-25 DIAGNOSIS — R3911 Hesitancy of micturition: Secondary | ICD-10-CM | POA: Insufficient documentation

## 2013-10-25 NOTE — Assessment & Plan Note (Deleted)
Pt has been living in [facility] for last 4 mos. More active. More back pain. Severe at times. Provider in [facility] prescribed hydrocodone. She takes it when severe & can't sleep-about 3/wk. Worse when not taking gabapentin. Add tramadol. Ref to rheum.

## 2013-10-26 LAB — URINE CULTURE
Colony Count: NO GROWTH
ORGANISM ID, BACTERIA: NO GROWTH

## 2013-10-27 ENCOUNTER — Other Ambulatory Visit: Payer: Self-pay

## 2013-10-27 DIAGNOSIS — M659 Synovitis and tenosynovitis, unspecified: Secondary | ICD-10-CM | POA: Insufficient documentation

## 2013-10-27 DIAGNOSIS — M533 Sacrococcygeal disorders, not elsewhere classified: Secondary | ICD-10-CM

## 2013-10-27 DIAGNOSIS — R238 Other skin changes: Secondary | ICD-10-CM | POA: Insufficient documentation

## 2013-10-27 DIAGNOSIS — I7300 Raynaud's syndrome without gangrene: Secondary | ICD-10-CM | POA: Insufficient documentation

## 2013-10-27 DIAGNOSIS — G8929 Other chronic pain: Secondary | ICD-10-CM | POA: Insufficient documentation

## 2013-10-27 NOTE — Assessment & Plan Note (Signed)
Continues to have episodes of dizziness, but only with position changes. She has learned to chsnge positions slowly. This is helping. Had cardio w/u in [facility] including nuclear stress test, holter monitor. No new findings. BP today 99/66. P 76. Weight unchanged since last visit, but pt reports 6 lb weight gain in 2 days, increased lasix to bid X 1day, then qd-lost 4 lbs. Over several days. Has appt w/Dr Bensimhon in next 2 weeks.

## 2013-10-27 NOTE — Assessment & Plan Note (Addendum)
Spontaneous Blisters on toes. 1 blister today on L 5th digit. Pt reports had 2 blisters on toes that went away. Also waxy plaques on dorsal surface hands. Recent sun exposure-living in [place], on beach nearly daily. 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 Ref rheum, given SI tender, swollen wrist, tight skin fingers, sudden CHF 1 ya. Connective tissue disease?

## 2013-10-27 NOTE — Assessment & Plan Note (Signed)
Feeling like not emptying all the way. Denies flank pain, dysuria, fever. POC ua & culture.

## 2013-10-27 NOTE — Assessment & Plan Note (Signed)
Pale, cool fingers & toes. Thickened skin fingers to PIP joints. Suspect scleroderma, labs neg. Ref to rheum for eval.

## 2013-10-27 NOTE — Progress Notes (Addendum)
Subjective:     Emily [name] is a 53 y.o. female presents after 4 mos. She relocated to [place] had hopes of establishing medical providers there. She has seen cardiology, oncology, & PCP, but she does not prefer PCP office. She has returned to [facility] for medical care. She continues on all same meds. She is concerned that her R wrist has become swollen-it was painful 4 mos ago, but minimal swelling. In addition, her back pain has gotten worse although she has been more active in [place] than when in [place]. She has been developing sporadic blisters on toes over last few weeks and has developed waxy plaques on dorsal surface of hands (poryphia cutanea?). She has had rheum w/u in past -all seroneg labs, but does have multiple symptoms that seem to be r/t connective tissue disease (SI joint pain, thickened skin on fingers, raynaud's feet & hands, sudden CHF, deQuervain's tenosynovitis, wrist synovitis). Of note, her brother has psoriatic arthritis.  Also, 1 week ago she gained 6 pounds in 2 days, she doubled lasix for 1 day, then went to qd dose. She lost 4 pounds over several days. She denied SOB, but felt abdomen was bloated-this is how sudden CHF episode presented last year that required hospitalization & resulted in diuresis of 30 lbs. Fluid. She saw cardiology in [place]. W/u included nuclear stress test & Holter monitor. No abnml findings according to pt. She c/o of dizziness w/position changes. I re-started zoloft 2 weeks ago when pt called stating she had become unusually tearful. Has Hx depression treated w/zoloft. Today she is tearful when discusses not getting along w/roommate in [place]. I have reviewed recent labs performed by provider in [facility].: 09/25/13 CBC-mildly elevated WBC, CMET-mildly elevated BUN, B12 416, Vit D 45.5.   The following portions of the patient's history were reviewed and updated as appropriate: allergies, current medications, past family history, past medical history, past social history, past surgical  history and problem list.  Review of Systems Pertinent items are noted in HPI.    Objective:    BP 99/66  Pulse 74  Temp(Src) 97.2 F (36.2 C) (Oral)  Resp 16  Ht 5\' 5"  (1.651 m)  Wt 139 lb (63.05 kg)  BMI 23.13 kg/m2  SpO2 96% BP 99/66  Pulse 74  Temp(Src) 97.2 F (36.2 C) (Oral)  Resp 16  Ht 5\' 5"  (1.651 m)  Wt 139 lb (63.05 kg)  BMI 23.13 kg/m2  SpO2 96% General appearance: alert, cooperative, appears stated age and no distress Head: Normocephalic, without obvious abnormality, atraumatic Eyes: negative findings: lids and lashes normal and conjunctivae and sclerae normal Lungs: wheezes LUL Heart: regular rate and rhythm, S1, S2 normal, no murmur, click, rub or gallop Abdomen: soft, non-tender; bowel sounds normal; no masses,  no organomegaly Extremities: extremities normal, atraumatic, no cyanosis or edema Pulses: 2+ and symmetric Skin: cool fingers & toes, blister L 5th digit 1.5cm, tight skin fingers from tips to PIP joints, waxy plagues dorsal surface hands. Lymph nodes: Cervical, supraclavicular, and axillary nodes normal.    Assessment:     1. Diabetes type 2, controlled - Hemoglobin A1c - Magnesium - glucose blood test strip; Test bid.  Dispense: 180 each; Refill: 3  2. History of anemia - Iron and TIBC - Ferritin  3. Hesitancy - POCT urinalysis dipstick - Urine culture  4. Chronic SI joint pain - TraMADol HCl 100 MG CP24; Take 100 mg by mouth 2 (two) times daily. Start qd for 5 days, then increase  to twice daily.  Dispense: 60 capsule; Refill: 2  5. Need for prophylactic vaccination and inoculation against influenza - Flu Vaccine QUAD 36+ mos PF IM (Fluarix Quad PF)  6. Synovitis or tenosynovitis of wrist - Ambulatory referral to Rheumatology  7. Raynaud's phenomenon - Ambulatory referral to Rheumatology  8. Skin thickening  9. Skin bulla  10. Depressive disorder, not elsewhere classified  11. Dizzy  See problem list for complete  A&P See pt instructions. F/u 4 weeks.

## 2013-10-27 NOTE — Assessment & Plan Note (Signed)
Started iron supplement 4 mos ago. CBC 09/25/13: hgb 13.6, WBC 11.8, PLt 188k Iron studies today.

## 2013-10-27 NOTE — Assessment & Plan Note (Signed)
Pt presents after 4 mos-moved to [facility], now returned. Had Wrist pain 4 mos ago, now has swelling R at radial aspect & warmth bilateral wrists. Ref to rheum, urgent.

## 2013-10-27 NOTE — Telephone Encounter (Signed)
A1C 6.3 from 6.4 5/15. Possibly due to increased activity. D/c glipizide. Continue metformin. Discussed w/pt: she reports BS after 2 cups coffee w/creamer: 100 & 136. Adv continue to check FBS at least twice weekly. Call if under 126. Continue activity & diet changes.  Mg nml, but low nml although she is taking 400 mg qd. Iron studies low nml, continue iron supplement.  F/u 4 weeks.

## 2013-10-27 NOTE — Assessment & Plan Note (Addendum)
Pt has been living in [facility] for last 4 mos. More active. More back pain. Severe at times. Provider in [facility] prescribed hydrocodone. She takes it when severe & can't sleep-about 3 times/wk. Worse when not taking gabapentin. Tender at bilat SI joint. Add tramadol. Ref to rheum.

## 2013-10-27 NOTE — Telephone Encounter (Signed)
Authorization for Tramadol Approved.

## 2013-10-27 NOTE — Assessment & Plan Note (Signed)
Having dizzy episodes, but she relates it to changing positions. Ran out of test strips 1 mo. Ago. Still taking metformin & glipizide. Denies polydispsia, phagia, & uria. A1C 07/09/13 6.4, will check today.

## 2013-10-27 NOTE — Assessment & Plan Note (Signed)
Tearful in ofc when mentions not getting along w/roommate/good friend. Re-started zoloft at 50 mg 2 weeks ago.  Continue at current dose. F/u 4 weeks to eval response.

## 2013-10-27 NOTE — Telephone Encounter (Signed)
Started [facility] [employer]. On Tramadol today. Awaiting response by fax.

## 2013-10-27 NOTE — Assessment & Plan Note (Signed)
Ref to rheum for eval & Tx. She has Dequervain's, tenosynovitis R wrist, raynaud's, developed blisters on toes & waxy plaques dorsal hands w/sun exposure, chronic SI joint pain, pain in feet. Suspect connective tissue disorder, spondyloarthropathy. Pos fam Hx: brother-psoriatic arthritis.

## 2013-11-11 ENCOUNTER — Telehealth: Payer: Self-pay | Admitting: Nurse Practitioner

## 2013-11-11 NOTE — Telephone Encounter (Signed)
Called [facility] to see if Emily [name] could be seen before 9/30. They will see her 9/17 at 10:30. Pt advised.

## 2013-11-12 ENCOUNTER — Ambulatory Visit (HOSPITAL_COMMUNITY)
Admission: RE | Admit: 2013-11-12 | Discharge: 2013-11-12 | Disposition: A | Discharge status: Home / Self Care | Payer: Medicare Other | Source: Ambulatory Visit | Attending: Internal Medicine | Admitting: Internal Medicine

## 2013-11-12 VITALS — BP 88/56 | HR 88 | Wt 135.2 lb

## 2013-11-12 DIAGNOSIS — E119 Type 2 diabetes mellitus without complications: Secondary | ICD-10-CM | POA: Insufficient documentation

## 2013-11-12 DIAGNOSIS — J4489 Other specified chronic obstructive pulmonary disease: Secondary | ICD-10-CM | POA: Insufficient documentation

## 2013-11-12 DIAGNOSIS — I10 Essential (primary) hypertension: Secondary | ICD-10-CM | POA: Insufficient documentation

## 2013-11-12 DIAGNOSIS — J449 Chronic obstructive pulmonary disease, unspecified: Secondary | ICD-10-CM | POA: Diagnosis not present

## 2013-11-12 DIAGNOSIS — I959 Hypotension, unspecified: Secondary | ICD-10-CM

## 2013-11-12 DIAGNOSIS — Z7982 Long term (current) use of aspirin: Secondary | ICD-10-CM | POA: Diagnosis not present

## 2013-11-12 DIAGNOSIS — I6529 Occlusion and stenosis of unspecified carotid artery: Secondary | ICD-10-CM | POA: Insufficient documentation

## 2013-11-12 DIAGNOSIS — Z853 Personal history of malignant neoplasm of breast: Secondary | ICD-10-CM | POA: Insufficient documentation

## 2013-11-12 DIAGNOSIS — I5022 Chronic systolic (congestive) heart failure: Secondary | ICD-10-CM | POA: Insufficient documentation

## 2013-11-12 DIAGNOSIS — E785 Hyperlipidemia, unspecified: Secondary | ICD-10-CM | POA: Diagnosis not present

## 2013-11-12 DIAGNOSIS — I6523 Occlusion and stenosis of bilateral carotid arteries: Secondary | ICD-10-CM

## 2013-11-12 DIAGNOSIS — I509 Heart failure, unspecified: Secondary | ICD-10-CM | POA: Insufficient documentation

## 2013-11-12 DIAGNOSIS — I2510 Atherosclerotic heart disease of native coronary artery without angina pectoris: Secondary | ICD-10-CM | POA: Insufficient documentation

## 2013-11-12 DIAGNOSIS — I428 Other cardiomyopathies: Secondary | ICD-10-CM | POA: Diagnosis not present

## 2013-11-12 DIAGNOSIS — I2584 Coronary atherosclerosis due to calcified coronary lesion: Secondary | ICD-10-CM

## 2013-11-12 DIAGNOSIS — F17200 Nicotine dependence, unspecified, uncomplicated: Secondary | ICD-10-CM | POA: Insufficient documentation

## 2013-11-12 DIAGNOSIS — I658 Occlusion and stenosis of other precerebral arteries: Secondary | ICD-10-CM

## 2013-11-12 LAB — BASIC METABOLIC PANEL
Anion gap: 14 (ref 5–15)
BUN: 11 mg/dL (ref 6–23)
CHLORIDE: 95 meq/L — AB (ref 96–112)
CO2: 25 meq/L (ref 19–32)
CREATININE: 0.87 mg/dL (ref 0.50–1.10)
Calcium: 9.3 mg/dL (ref 8.4–10.5)
GFR calc Af Amer: 85 mL/min — ABNORMAL LOW (ref >90)
GFR calc non Af Amer: 74 mL/min — ABNORMAL LOW (ref >90)
GLUCOSE: 118 mg/dL — AB (ref 70–99)
POTASSIUM: 4.5 meq/L (ref 3.7–5.3)
Sodium: 134 mEq/L — ABNORMAL LOW (ref 137–147)

## 2013-11-12 NOTE — Addendum Note (Signed)
Encounter addended by: Scarlette Calico, RN on: 11/12/2013  3:00 PM<BR>     Documentation filed: Visit Diagnoses, Patient Instructions Section, Orders

## 2013-11-12 NOTE — Patient Instructions (Signed)
Your physician has requested that you have a carotid duplex. This test is an ultrasound of the carotid arteries in your neck. It looks at blood flow through these arteries that supply the brain with blood. Allow one hour for this exam. There are no restrictions or special instructions.

## 2013-11-12 NOTE — Addendum Note (Signed)
Encounter addended by: Scarlette Calico, RN on: 11/12/2013  3:03 PM<BR>     Documentation filed: Orders

## 2013-11-12 NOTE — Addendum Note (Signed)
Encounter addended by: Jolaine Artist, MD on: 11/12/2013  2:57 PM<BR>     Documentation filed: Follow-up Section, LOS Section, Problem List, Notes Section

## 2013-11-12 NOTE — Progress Notes (Addendum)
Patient ID: Emily [name], female   DOB: [birth year], 53 y.o.   MRN: [id number] PCP: Dr Kathlen Mody Primary Cardiologist: Dr. Haroldine Laws  Emily [name] is a 53 yo woman with a history of ER + R breast cancer s/p R lumpectomy, DM2, cholelithiasis s/p cholecystectomy (02/2013), COPD and systolic CHF due to nonischemic cardiomyopathy.  Patient was admitted in 09/2012 with acute pulmonary edema and cardiogenic shock.  LHC showed moderate nonobstructive CAD (40-50% mLAD, 50-70% LCx, 50% PDA).  She was started on milrinone and diuresed.  Cause of cardiomyopathy suspected to be Adriamycin toxicity.    ECHO 10/27/12 EF 20-25% ECHO 01/09/13 EF 30-35% ECHO 06/05/13 EF 45-50% septum dysynnergic  12/05/12 Carotid Dopplers- R 39 % stenosis and L  40-59% stenosis  She has moved to [place] but returns for an evaluation of dizziness. Says she gets dizzy when she stands.  Going on since end of July. Cardiologist there ordered Holter and Stress test and CT scan brain. Very active. No CP or SOB. No edema. Still taking lasix 40 daily. Still smoking.   Lexiscan 09/23/13: EF 50% normal perfusion Holter: SR occasional PVCs  Labs (9/14): digoxin 0.4, K 3.6, creatinine 0.62 Labs (9/15): K 4.4 creatinine 0.68 Labs (10/26):    K 3.7 Cr 0.67 Labs: (03/20/2013): K+ 4.6, Cr 0.73 Labs (09/25/13): K 4.2 Cr 0.9  PMH: 1. COPD: PFTs (1/14) with FEV1 68%.  Quit smoking 9/14.  2. Type II diabetes  3. HTN 4. Breast cancer: 2009, s/p Adriamycin/Cytoxan/Taxol chemo, lumpectomy and radiation.   5. Nonischemic cardiomyopathy: Possibly due to Adriamycin.  Echo (8/14) with EF 20-25%, global hypokinesis, moderate diastolic dysfunction, normal RV size and with mild to moderately decreased systolic function, moderate TR.   6. CAD: LHC (8/14) with nonobstructive CAD; 40-50% mLAD, 50-70% LCx, 50% PDA.  7. Hyperlipidemia  SH: Lives alone in [place].  Mother is next door.  Quit smoking in 9/14.  FH: No premature CAD.  No family history of  cardiomyopathy.   ROS: All systems reviewed and negative except as per HPI.   Current Outpatient Prescriptions  Medication Sig Dispense Refill  . aspirin EC 81 MG tablet Take 1 tablet (81 mg total) by mouth daily.  90 tablet  3  . atorvastatin (LIPITOR) 20 MG tablet Take 1 tablet (20 mg total) by mouth daily.  90 tablet  3  . calcium-vitamin D (OSCAL WITH D) 500-200 MG-UNIT per tablet Take 1 tablet by mouth 2 (two) times daily.  180 tablet  3  . carvedilol (COREG) 6.25 MG tablet Take 1 tablet (6.25 mg total) by mouth 2 (two) times daily with a meal.  180 tablet  3  . Cyanocobalamin (VITAMIN B 12 PO) Take 1 tablet by mouth daily.      . diclofenac sodium (VOLTAREN) 1 % GEL Apply 1 application topically 4 (four) times daily as needed. Apply to painful areas on hands for arthritis      . enalapril (VASOTEC) 10 MG tablet Take 0.5 tablets (5 mg total) by mouth 2 (two) times daily.  180 tablet  3  . ferrous sulfate 325 (65 FE) MG tablet Take 1 tablet (325 mg total) by mouth at bedtime.  90 tablet  3  . furosemide (LASIX) 40 MG tablet Take 1 tablet (40 mg total) by mouth as needed. Take extra tab if needed for wt gain  30 tablet  3  . gabapentin (NEURONTIN) 400 MG capsule Take 2 capsules (800 mg total) by mouth 3 (three) times daily.  180 capsule  2  . glucose blood test strip Test bid.  180 each  3  . HYDROcodone-acetaminophen (NORCO/VICODIN) 5-325 MG per tablet Take 1 tablet by mouth every 6 (six) hours as needed.  30 tablet  0  . hydrOXYzine (ATARAX/VISTARIL) 25 MG tablet Take 1 tablet (25 mg total) by mouth at bedtime.  90 tablet  0  . ibuprofen (ADVIL,MOTRIN) 600 MG tablet Take 1 tablet (600 mg total) by mouth every 6 (six) hours as needed.  30 tablet  0  . letrozole (FEMARA) 2.5 MG tablet Take 1 tablet (2.5 mg total) by mouth every morning.  90 tablet  2  . loperamide (IMODIUM) 2 MG capsule Take 1 capsule (2 mg total) by mouth daily as needed for diarrhea or loose stools.  90 capsule  0  .  LORazepam (ATIVAN) 2 MG tablet Take 1 tablet (2 mg total) by mouth at bedtime.  90 tablet  0  . magnesium oxide (MAG-OX) 400 MG tablet Take 1 tablet (400 mg total) by mouth daily.  90 tablet  3  . metFORMIN (GLUCOPHAGE) 1000 MG tablet Take 1 tablet (1,000 mg total) by mouth 2 (two) times daily with a meal.  180 tablet  0  . sertraline (ZOLOFT) 50 MG tablet Take 1 tablet (50 mg total) by mouth at bedtime.  90 tablet  0  . spironolactone (ALDACTONE) 25 MG tablet Take 25 mg by mouth every morning.      . TraMADol HCl 100 MG CP24 Take 100 mg by mouth 2 (two) times daily. Start qd for 5 days, then increase to twice daily.  60 capsule  2   No current facility-administered medications for this encounter.    Filed Vitals:   11/12/13 1411  BP: 88/56  Pulse: 88  Weight: 135 lb 4 oz (61.349 kg)  SpO2: 96%    Orthostatic BP  113/68 lying HR 75 Standing BP     94/73 standing HR 85  General: NAD Neck: JVP flat, no thyromegaly or thyroid nodule.  Lungs: Clear to auscultation bilaterally with normal respiratory effort. Decreased BS throughout.  CV: Nondisplaced PMI.  Heart regular S1/S2, no S3/S4, no murmur.  No peripheral edema.  No carotid bruit.  Normal pedal pulses.  Abdomen: Soft, mildly tender, no hepatosplenomegaly, no distention. R scar Skin: Intact without lesions or rashes.  Neurologic: Alert and oriented x 3.  Psych: Normal affect. Extremities: No clubbing or cyanosis. DPs 1+ bilateral  HEENT: Normal.   Assessment/Plan: 1. Chronic systolic CHF: NICM, likely Adriamycin toxicity,. EF improved to 45-50%  - NYHA I  Symptoms. Volume status low. Now orthostatic. Switch to prn lasix.  - Continue coreg 6.25 mg BID, enalapril  5 mg bid and spiro for now. If SBP still < 100 can stop spiro too - Check BMET 2. CAD: Moderate nonobstructive disease.  Continue ASA 81, statin and BB.  3. COPD: Still smoking. I have encouraged her to stop once again 4. Current smoker- Encouraged to stop smoking she  declines smoking cessation.  5. Breast Cancer: received adriamycin. Cancer free for 5 years. 6. Carotid stenosis: asymptomatic. Will get yearly u/s. Continue asa and statin.   Joany Khatib,MD 2:41 PM

## 2013-11-13 ENCOUNTER — Ambulatory Visit (HOSPITAL_COMMUNITY): Payer: Medicare Other | Attending: Internal Medicine | Admitting: Cardiology

## 2013-11-13 DIAGNOSIS — F17200 Nicotine dependence, unspecified, uncomplicated: Secondary | ICD-10-CM | POA: Insufficient documentation

## 2013-11-13 DIAGNOSIS — I6529 Occlusion and stenosis of unspecified carotid artery: Secondary | ICD-10-CM

## 2013-11-13 DIAGNOSIS — R42 Dizziness and giddiness: Secondary | ICD-10-CM | POA: Diagnosis present

## 2013-11-13 DIAGNOSIS — J4489 Other specified chronic obstructive pulmonary disease: Secondary | ICD-10-CM | POA: Insufficient documentation

## 2013-11-13 DIAGNOSIS — E785 Hyperlipidemia, unspecified: Secondary | ICD-10-CM | POA: Insufficient documentation

## 2013-11-13 DIAGNOSIS — E119 Type 2 diabetes mellitus without complications: Secondary | ICD-10-CM | POA: Insufficient documentation

## 2013-11-13 DIAGNOSIS — I2510 Atherosclerotic heart disease of native coronary artery without angina pectoris: Secondary | ICD-10-CM | POA: Diagnosis not present

## 2013-11-13 DIAGNOSIS — J449 Chronic obstructive pulmonary disease, unspecified: Secondary | ICD-10-CM | POA: Diagnosis not present

## 2013-11-13 NOTE — Progress Notes (Signed)
Carotid duplex performed 

## 2013-11-19 ENCOUNTER — Ambulatory Visit: Payer: Medicare Other | Admitting: Nurse Practitioner

## 2013-11-27 ENCOUNTER — Encounter: Payer: Self-pay | Admitting: Nurse Practitioner

## 2013-12-01 ENCOUNTER — Ambulatory Visit: Payer: Medicare Other | Admitting: Nurse Practitioner

## 2013-12-02 ENCOUNTER — Telehealth (HOSPITAL_COMMUNITY): Payer: Self-pay | Admitting: Vascular Surgery

## 2013-12-02 DIAGNOSIS — E785 Hyperlipidemia, unspecified: Secondary | ICD-10-CM

## 2013-12-02 DIAGNOSIS — I5022 Chronic systolic (congestive) heart failure: Secondary | ICD-10-CM

## 2013-12-02 MED ORDER — ENALAPRIL MALEATE 10 MG PO TABS: 5.0000 mg | ORAL_TABLET | Freq: Two times a day (BID) | ORAL | Status: DC

## 2013-12-02 MED ORDER — SPIRONOLACTONE 25 MG PO TABS: 25.0000 mg | ORAL_TABLET | ORAL | Status: DC

## 2013-12-02 MED ORDER — ATORVASTATIN CALCIUM 20 MG PO TABS: 20.0000 mg | ORAL_TABLET | Freq: Every day | ORAL | Status: DC

## 2013-12-02 MED ORDER — CARVEDILOL 6.25 MG PO TABS: 6.2500 mg | ORAL_TABLET | Freq: Two times a day (BID) | ORAL | Status: DC

## 2013-12-02 NOTE — Telephone Encounter (Signed)
Refill Carvedilol , atorvastatin,spiralactone, enalapril 90 day supply called in to the [facility]

## 2013-12-05 ENCOUNTER — Encounter: Payer: Self-pay | Admitting: Nurse Practitioner

## 2013-12-05 ENCOUNTER — Ambulatory Visit (INDEPENDENT_AMBULATORY_CARE_PROVIDER_SITE_OTHER): Payer: Medicare Other | Admitting: Nurse Practitioner

## 2013-12-05 VITALS — BP 87/57 | HR 57 | Temp 97.7°F | Ht 65.0 in | Wt 142.0 lb

## 2013-12-05 DIAGNOSIS — R062 Wheezing: Secondary | ICD-10-CM

## 2013-12-05 DIAGNOSIS — M659 Synovitis and tenosynovitis, unspecified: Secondary | ICD-10-CM

## 2013-12-05 DIAGNOSIS — F329 Major depressive disorder, single episode, unspecified: Secondary | ICD-10-CM

## 2013-12-05 DIAGNOSIS — R42 Dizziness and giddiness: Secondary | ICD-10-CM

## 2013-12-05 DIAGNOSIS — IMO0002 Reserved for concepts with insufficient information to code with codable children: Secondary | ICD-10-CM

## 2013-12-05 DIAGNOSIS — R21 Rash and other nonspecific skin eruption: Secondary | ICD-10-CM

## 2013-12-05 DIAGNOSIS — M6588 Other synovitis and tenosynovitis, other site: Secondary | ICD-10-CM

## 2013-12-05 DIAGNOSIS — F32A Depression, unspecified: Secondary | ICD-10-CM

## 2013-12-05 DIAGNOSIS — E1165 Type 2 diabetes mellitus with hyperglycemia: Secondary | ICD-10-CM

## 2013-12-05 DIAGNOSIS — L299 Pruritus, unspecified: Secondary | ICD-10-CM

## 2013-12-05 DIAGNOSIS — R3911 Hesitancy of micturition: Secondary | ICD-10-CM

## 2013-12-05 MED ORDER — DICLOFENAC SODIUM 1 % TD GEL: TRANSDERMAL | Status: DC

## 2013-12-05 MED ORDER — SERTRALINE HCL 50 MG PO TABS: 50.0000 mg | ORAL_TABLET | Freq: Every day | ORAL | Status: DC

## 2013-12-05 MED ORDER — HYDROXYZINE HCL 25 MG PO TABS: 25.0000 mg | ORAL_TABLET | Freq: Every day | ORAL | Status: DC

## 2013-12-05 MED ORDER — METFORMIN HCL 1000 MG PO TABS: 1000.0000 mg | ORAL_TABLET | Freq: Two times a day (BID) | ORAL | Status: DC

## 2013-12-05 MED ORDER — ALBUTEROL SULFATE HFA 108 (90 BASE) MCG/ACT IN AERS
2.0000 | INHALATION_SPRAY | RESPIRATORY_TRACT | Status: DC | PRN
Start: 2013-12-05 — End: 2014-10-02

## 2013-12-05 NOTE — Patient Instructions (Signed)
Blood pressure: Dr Bensihmon's note states you may stop spironolactone if top number is under 90.  Stay hydrated. Take lasix if more than 3 pound increase in 24 hours on same scale.  Use voltaren gel on wrist 3 timesd daily- quarter-sized amount. Continue brace.  Use inhaler daily-at least once, twice is OK.  Please see dermatology for blistering rash.  See me in 3 months.

## 2013-12-05 NOTE — Progress Notes (Signed)
Pre visit review using our clinic review tool, if applicable. No additional management support is needed unless otherwise documented below in the visit note. 

## 2013-12-08 DIAGNOSIS — R21 Rash and other nonspecific skin eruption: Secondary | ICD-10-CM | POA: Insufficient documentation

## 2013-12-08 NOTE — Progress Notes (Signed)
Subjective:     Emily [name] is a 53 y.o. female with DM, sudden onset CHF thought to r/t adriamycin toxicity, chronic back & foot pain, low blood pressure, smoker, blistering rash, depression, urinary hesitancy, and tendonitis of wrist. Today she presents for f/u after starting zoloft. She feels medication has improved depression as she is less teary.  She wishes to continue med.  Voltaren gel is helping wrist tendonitis. She was evaluated by Dr Trudie Reed several weeks ago. She is wearing splints on wrists. I will refill voltaren gel today. She continues to develop clear blisters on fingers & toes. Dr Trudie Reed did not think they are autoimmune related. Blisters are painless. Look like porphyria, but that would not be consistent with iron deficiency, although she has had more sun exposure in last few mos, which is associated w/porphyria flares. i will refer to derm for biopsy. Her bp is low today. Per Dr Bensihmon's note, she may stop spironolactone if BP under 90. Instructed pt to do so. She states dizzy episodes have improved and were associated w/position changes only.  She continues to c/o urinary hesitancy & states she has to push on bladder to completely empty. She does not wish to see urology at this time. She is considering stopping smoking again. She reports fluctuations in weight-she weighs daily. I advised to take lasix only if she has more than 3 lb. Weight gain in 24 hrs. DM is well controlled. She is taking 1000 mg metformin twice daily. Last A1C 6.3 from 9.4. She has made diet changes, has stopped glucophage.  She plans to return to [facility] in next few months. She has not been successful finding providers in [place] that she is happy with. For now, she will continue to see providers in [place].   The following portions of the patient's history were reviewed and updated as appropriate: allergies, current medications, past medical history, past social history, past surgical history and problem  list.  Review of Systems Pertinent items are noted in HPI.    Objective:    BP 87/57  Pulse 57  Temp(Src) 97.7 F (36.5 C) (Temporal)  Ht 5\' 5"  (1.651 m)  Wt 142 lb (64.411 kg)  BMI 23.63 kg/m2  SpO2 95% BP 87/57  Pulse 57  Temp(Src) 97.7 F (36.5 C) (Temporal)  Ht 5\' 5"  (1.651 m)  Wt 142 lb (64.411 kg)  BMI 23.63 kg/m2  SpO2 95% General appearance: alert, cooperative, appears stated age and no distress Head: Normocephalic, without obvious abnormality, atraumatic Eyes: negative findings: lids and lashes normal and conjunctivae and sclerae normal Lungs: clear to auscultation bilaterally Heart: regular rate and rhythm, S1, S2 normal, no murmur, click, rub or gallop Extremities: extremities normal, atraumatic, no cyanosis or edema Pulses: 2+ and symmetric Skin: clear blisters top of L toe & lateral L finger. Each about 3 to 5 mm.    Assessment:  1. Diabetes mellitus type 2, uncontrolled - metFORMIN (GLUCOPHAGE) 1000 MG tablet; Take 1 tablet (1,000 mg total) by mouth 2 (two) times daily with a meal.  Dispense: 180 tablet; Refill: 0  2. Depressed - sertraline (ZOLOFT) 50 MG tablet; Take 1 tablet (50 mg total) by mouth at bedtime.  Dispense: 90 tablet; Refill: 0  3. Pruritus - hydrOXYzine (ATARAX/VISTARIL) 25 MG tablet; Take 1 tablet (25 mg total) by mouth at bedtime.  Dispense: 90 tablet; Refill: 0  4. Synovitis or tenosynovitis of wrist - diclofenac sodium (VOLTAREN) 1 % GEL; Apply dime-sized amount to joints in hands 3  times daily.  Dispense: 100 g; Refill: 3  5. Wheezing - albuterol (PROVENTIL HFA;VENTOLIN HFA) 108 (90 BASE) MCG/ACT inhaler; Inhale 2 puffs into the lungs every 4 (four) hours as needed for wheezing.  Dispense: 1 Inhaler; Refill: 5  6. Bullous rash - Ambulatory referral to Dermatology  7.urinary Hesitancy

## 2013-12-08 NOTE — Assessment & Plan Note (Signed)
Wearing wrist splints.  Refill voltaren gel. She received sample from Dr Trudie Reed.

## 2013-12-08 NOTE — Assessment & Plan Note (Signed)
Was occurring w/position changes. Improved

## 2013-12-08 NOTE — Assessment & Plan Note (Signed)
Continues on zoloft-feel helping. States not as teary. Continue current dose 50 mg qd.

## 2013-12-08 NOTE — Assessment & Plan Note (Addendum)
Continues to develop painless clear blisters on toes & fingers. Rheum did not think these are AI Look like porphyria to me, but she has been iron deficient in past. Porphyria is typically associated with increase in iron. She has had more sun exposure than usual as she moved to [facility] last spring. Ref to derm for Biopsy of blisters. Consider w/u including serum ferritin, urinary porphyrin (24 hr collection).

## 2013-12-08 NOTE — Assessment & Plan Note (Signed)
Still having urinary hesitancy-feels like has to push on bladder to empty. Continue to monitor.

## 2013-12-30 ENCOUNTER — Other Ambulatory Visit: Payer: Self-pay | Admitting: *Deleted

## 2013-12-30 DIAGNOSIS — G4700 Insomnia, unspecified: Secondary | ICD-10-CM

## 2013-12-30 NOTE — Telephone Encounter (Signed)
pls ask if she is taking it more than once daily. She should have 30 days left.

## 2013-12-30 NOTE — Telephone Encounter (Signed)
Refill request for lorazepam Last filled by MD on- 10/14/2013 #90 x0 Last Appt: 12/05/2013 Next Appt: 02/12/2014 Please advise refill? Patient is still in [place].

## 2013-12-31 ENCOUNTER — Other Ambulatory Visit: Payer: Self-pay

## 2013-12-31 MED ORDER — LORAZEPAM 2 MG PO TABS: 2.0000 mg | ORAL_TABLET | Freq: Every day | ORAL | Status: DC

## 2013-12-31 NOTE — Telephone Encounter (Signed)
Patient stated that she has a week and half left of Lorazepam. Patient stated that she has been taking one nightly. Patient has already left for [facility] and is having her mom pick up her rx's from pharmacy to mail to her.

## 2014-01-27 ENCOUNTER — Other Ambulatory Visit: Payer: Self-pay

## 2014-01-27 ENCOUNTER — Other Ambulatory Visit: Payer: Self-pay | Admitting: Nurse Practitioner

## 2014-01-27 DIAGNOSIS — G8929 Other chronic pain: Secondary | ICD-10-CM

## 2014-01-27 DIAGNOSIS — M533 Sacrococcygeal disorders, not elsewhere classified: Principal | ICD-10-CM

## 2014-01-27 MED ORDER — TRAMADOL HCL (ER BIPHASIC) 100 MG PO CP24: 100.0000 mg | ORAL_CAPSULE | Freq: Two times a day (BID) | ORAL | Status: DC

## 2014-01-27 NOTE — Telephone Encounter (Signed)
Pt called and would like a refill on Tramadol for the next three months. She is leaving to go back to [place]. Please advise.

## 2014-01-28 NOTE — Progress Notes (Signed)
Rx sent to her pharmacy 

## 2014-02-05 ENCOUNTER — Encounter (HOSPITAL_COMMUNITY): Payer: Self-pay | Admitting: Interventional Cardiology

## 2014-02-11 ENCOUNTER — Encounter (HOSPITAL_COMMUNITY): Payer: Self-pay | Admitting: Emergency Medicine

## 2014-02-11 ENCOUNTER — Emergency Department (HOSPITAL_COMMUNITY)
Admission: EM | Admit: 2014-02-11 | Discharge: 2014-02-11 | Disposition: A | Discharge status: Home / Self Care | Payer: Medicare Other | Attending: Emergency Medicine | Admitting: Emergency Medicine

## 2014-02-11 DIAGNOSIS — M79604 Pain in right leg: Secondary | ICD-10-CM

## 2014-02-11 DIAGNOSIS — D509 Iron deficiency anemia, unspecified: Secondary | ICD-10-CM | POA: Insufficient documentation

## 2014-02-11 DIAGNOSIS — Z9889 Other specified postprocedural states: Secondary | ICD-10-CM | POA: Insufficient documentation

## 2014-02-11 DIAGNOSIS — I5022 Chronic systolic (congestive) heart failure: Secondary | ICD-10-CM | POA: Insufficient documentation

## 2014-02-11 DIAGNOSIS — Z8709 Personal history of other diseases of the respiratory system: Secondary | ICD-10-CM | POA: Diagnosis not present

## 2014-02-11 DIAGNOSIS — F419 Anxiety disorder, unspecified: Secondary | ICD-10-CM | POA: Diagnosis not present

## 2014-02-11 DIAGNOSIS — Z853 Personal history of malignant neoplasm of breast: Secondary | ICD-10-CM | POA: Diagnosis not present

## 2014-02-11 DIAGNOSIS — M79609 Pain in unspecified limb: Secondary | ICD-10-CM

## 2014-02-11 DIAGNOSIS — M1990 Unspecified osteoarthritis, unspecified site: Secondary | ICD-10-CM | POA: Insufficient documentation

## 2014-02-11 DIAGNOSIS — M25561 Pain in right knee: Secondary | ICD-10-CM

## 2014-02-11 DIAGNOSIS — I10 Essential (primary) hypertension: Secondary | ICD-10-CM | POA: Insufficient documentation

## 2014-02-11 DIAGNOSIS — K219 Gastro-esophageal reflux disease without esophagitis: Secondary | ICD-10-CM | POA: Insufficient documentation

## 2014-02-11 DIAGNOSIS — F329 Major depressive disorder, single episode, unspecified: Secondary | ICD-10-CM | POA: Insufficient documentation

## 2014-02-11 DIAGNOSIS — E785 Hyperlipidemia, unspecified: Secondary | ICD-10-CM | POA: Insufficient documentation

## 2014-02-11 DIAGNOSIS — E1140 Type 2 diabetes mellitus with diabetic neuropathy, unspecified: Secondary | ICD-10-CM | POA: Diagnosis not present

## 2014-02-11 DIAGNOSIS — Z79899 Other long term (current) drug therapy: Secondary | ICD-10-CM | POA: Insufficient documentation

## 2014-02-11 DIAGNOSIS — Z720 Tobacco use: Secondary | ICD-10-CM | POA: Diagnosis not present

## 2014-02-11 DIAGNOSIS — G4700 Insomnia, unspecified: Secondary | ICD-10-CM | POA: Insufficient documentation

## 2014-02-11 DIAGNOSIS — Z791 Long term (current) use of non-steroidal anti-inflammatories (NSAID): Secondary | ICD-10-CM | POA: Diagnosis not present

## 2014-02-11 DIAGNOSIS — Z7982 Long term (current) use of aspirin: Secondary | ICD-10-CM | POA: Diagnosis not present

## 2014-02-11 DIAGNOSIS — M79606 Pain in leg, unspecified: Secondary | ICD-10-CM | POA: Diagnosis present

## 2014-02-11 DIAGNOSIS — M25569 Pain in unspecified knee: Secondary | ICD-10-CM

## 2014-02-11 LAB — CBG MONITORING, ED: GLUCOSE-CAPILLARY: 129 mg/dL — AB (ref 70–99)

## 2014-02-11 MED ORDER — NAPROXEN 250 MG PO TABS
500.0000 mg | ORAL_TABLET | Freq: Once | ORAL | Status: AC
  Administered 2014-02-11: 500 mg via ORAL
  Filled 2014-02-11: qty 2

## 2014-02-11 NOTE — ED Notes (Signed)
Vascular sts pt is next- pt and NP updated.

## 2014-02-11 NOTE — Progress Notes (Signed)
*  Preliminary Results* Right lower extremity venous duplex completed. Right lower extremity is negative for deep vein thrombosis. There is no evidence of right Baker's cyst.  02/11/2014 1:46 PM  Maudry Mayhew, RVT, RDCS, RDMS

## 2014-02-11 NOTE — Discharge Instructions (Signed)
If you were given medicines take as directed.  If you are on coumadin or contraceptives realize their levels and effectiveness is altered by many different medicines.  If you have any reaction (rash, tongues swelling, other) to the medicines stop taking and see a physician.   Please follow up as directed and return to the ER or see a physician for new or worsening symptoms.  Thank you. Filed Vitals:   02/11/14 1230 02/11/14 1300 02/11/14 1337 02/11/14 1400  BP: 101/57 111/53 98/64 102/59  Pulse: 69 69 72 70  Temp:      TempSrc:      Resp: 16 16  14   Height:      Weight:      SpO2: 91% 92% 93% 94%

## 2014-02-11 NOTE — ED Notes (Signed)
Patient complaining of "high sugars" and "R leg pain that is behind my knee and my foot goes numb".   Patient denies any other problems.

## 2014-02-11 NOTE — ED Notes (Signed)
CALLED VASCULAR LAB TO CHECK ON DELAY. THEY ADVISE WILL BE A WHILE LONGER

## 2014-02-11 NOTE — ED Notes (Signed)
Spoke with Agricultural consultant.   Will be moving patient to another room as soon as we get one.
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
## 2014-02-11 NOTE — ED Notes (Addendum)
Patient CBG was 129 Nurse Doroteo Bradford was informed.

## 2014-02-12 ENCOUNTER — Ambulatory Visit (INDEPENDENT_AMBULATORY_CARE_PROVIDER_SITE_OTHER): Payer: Medicare Other | Admitting: Nurse Practitioner

## 2014-02-12 ENCOUNTER — Encounter: Payer: Self-pay | Admitting: Nurse Practitioner

## 2014-02-12 VITALS — BP 115/75 | HR 77 | Temp 97.8°F | Ht 65.0 in | Wt 147.0 lb

## 2014-02-12 DIAGNOSIS — Z79899 Other long term (current) drug therapy: Secondary | ICD-10-CM | POA: Diagnosis not present

## 2014-02-12 DIAGNOSIS — M792 Neuralgia and neuritis, unspecified: Secondary | ICD-10-CM

## 2014-02-12 DIAGNOSIS — D509 Iron deficiency anemia, unspecified: Secondary | ICD-10-CM

## 2014-02-12 DIAGNOSIS — E559 Vitamin D deficiency, unspecified: Secondary | ICD-10-CM | POA: Diagnosis not present

## 2014-02-12 DIAGNOSIS — F329 Major depressive disorder, single episode, unspecified: Secondary | ICD-10-CM

## 2014-02-12 DIAGNOSIS — E1165 Type 2 diabetes mellitus with hyperglycemia: Secondary | ICD-10-CM

## 2014-02-12 DIAGNOSIS — I5022 Chronic systolic (congestive) heart failure: Secondary | ICD-10-CM

## 2014-02-12 DIAGNOSIS — IMO0002 Reserved for concepts with insufficient information to code with codable children: Secondary | ICD-10-CM

## 2014-02-12 DIAGNOSIS — F32A Depression, unspecified: Secondary | ICD-10-CM

## 2014-02-12 DIAGNOSIS — L299 Pruritus, unspecified: Secondary | ICD-10-CM

## 2014-02-12 LAB — VITAMIN D 25 HYDROXY (VIT D DEFICIENCY, FRACTURES): VITD: 32.06 ng/mL (ref 30.00–100.00)

## 2014-02-12 LAB — LIPID PANEL
Cholesterol: 142 mg/dL (ref 0–200)
HDL: 52.6 mg/dL (ref >39.00)
LDL CALC: 55 mg/dL (ref 0–99)
NonHDL: 89.4
Total CHOL/HDL Ratio: 3
Triglycerides: 171 mg/dL — ABNORMAL HIGH (ref 0.0–149.0)
VLDL: 34.2 mg/dL (ref 0.0–40.0)

## 2014-02-12 LAB — CBC WITH DIFFERENTIAL/PLATELET
BASOS PCT: 0.3 % (ref 0.0–3.0)
Basophils Absolute: 0 10*3/uL (ref 0.0–0.1)
Eosinophils Absolute: 0.4 10*3/uL (ref 0.0–0.7)
Eosinophils Relative: 3.2 % (ref 0.0–5.0)
HEMATOCRIT: 43 % (ref 36.0–46.0)
HEMOGLOBIN: 14 g/dL (ref 12.0–15.0)
LYMPHS PCT: 35.6 % (ref 12.0–46.0)
Lymphs Abs: 4.5 10*3/uL — ABNORMAL HIGH (ref 0.7–4.0)
MCHC: 32.7 g/dL (ref 30.0–36.0)
MCV: 89.7 fl (ref 78.0–100.0)
MONOS PCT: 4 % (ref 3.0–12.0)
Monocytes Absolute: 0.5 10*3/uL (ref 0.1–1.0)
NEUTROS ABS: 7.1 10*3/uL (ref 1.4–7.7)
Neutrophils Relative %: 56.9 % (ref 43.0–77.0)
Platelets: 198 10*3/uL (ref 150.0–400.0)
RBC: 4.79 Mil/uL (ref 3.87–5.11)
RDW: 13.4 % (ref 11.5–15.5)
WBC: 12.5 10*3/uL — ABNORMAL HIGH (ref 4.0–10.5)

## 2014-02-12 LAB — MICROALBUMIN / CREATININE URINE RATIO
Creatinine,U: 27.8 mg/dL
Microalb Creat Ratio: 0.4 mg/g (ref 0.0–30.0)
Microalb, Ur: 0.1 mg/dL (ref 0.0–1.9)

## 2014-02-12 LAB — COMPREHENSIVE METABOLIC PANEL
ALT: 21 U/L (ref 0–35)
AST: 22 U/L (ref 0–37)
Albumin: 4.1 g/dL (ref 3.5–5.2)
Alkaline Phosphatase: 55 U/L (ref 39–117)
BUN: 20 mg/dL (ref 6–23)
CALCIUM: 9.5 mg/dL (ref 8.4–10.5)
CHLORIDE: 103 meq/L (ref 96–112)
CO2: 23 mEq/L (ref 19–32)
CREATININE: 0.6 mg/dL (ref 0.4–1.2)
GFR: 108.02 mL/min (ref >60.00)
Glucose, Bld: 97 mg/dL (ref 70–99)
Potassium: 4.3 mEq/L (ref 3.5–5.1)
Sodium: 136 mEq/L (ref 135–145)
Total Bilirubin: 0.5 mg/dL (ref 0.2–1.2)
Total Protein: 6.9 g/dL (ref 6.0–8.3)

## 2014-02-12 LAB — TSH: TSH: 1.78 u[IU]/mL (ref 0.35–4.50)

## 2014-02-12 LAB — VITAMIN B12: Vitamin B-12: 279 pg/mL (ref 211–911)

## 2014-02-12 LAB — HEMOGLOBIN A1C: Hgb A1c MFr Bld: 7.6 % — ABNORMAL HIGH (ref 4.6–6.5)

## 2014-02-12 MED ORDER — SERTRALINE HCL 50 MG PO TABS: 50.0000 mg | ORAL_TABLET | Freq: Every day | ORAL | Status: DC

## 2014-02-12 MED ORDER — METFORMIN HCL 1000 MG PO TABS: 1000.0000 mg | ORAL_TABLET | Freq: Two times a day (BID) | ORAL | Status: DC

## 2014-02-12 MED ORDER — FERROUS SULFATE 325 (65 FE) MG PO TABS: 325.0000 mg | ORAL_TABLET | Freq: Every day | ORAL | Status: DC

## 2014-02-12 NOTE — Patient Instructions (Addendum)
Do back exercises as demonstrated. I think you have sclerotic sacroiliac joints & possibly other arthritis in back that contributed to leg pain yesterday.  Use naprosyn daily for joint & back pain.  Letter to follow.  Merry Christmas!

## 2014-02-12 NOTE — Progress Notes (Signed)
Pre visit review using our clinic review tool, if applicable. No additional management support is needed unless otherwise documented below in the visit note. 

## 2014-02-13 ENCOUNTER — Telehealth: Payer: Self-pay | Admitting: Nurse Practitioner

## 2014-02-13 LAB — IRON AND TIBC
%SAT: 17 % — AB (ref 20–55)
IRON: 57 ug/dL (ref 42–145)
TIBC: 327 ug/dL (ref 250–470)
UIBC: 270 ug/dL (ref 125–400)

## 2014-02-13 NOTE — Telephone Encounter (Signed)
pls call pt: Advise Labs reflect that she is eating more sugar: triglycerides & A1C are up, so scale back on sugar or diabetes meds will have to be adjusted. Vit D low-take 5000 iu qd with meal, should have it checked again in 12 weeks. No anemia, but iron is borderline-keep taking iron supplement daily with 500 mg vitamin C B12 is low. If she is taking B complex every day, it isn't effective & needs B12 injections.  Let her know, It will be few days before I can get letter sent to her due to high patient volumes.

## 2014-02-17 ENCOUNTER — Other Ambulatory Visit: Payer: Self-pay | Admitting: Nurse Practitioner

## 2014-02-17 DIAGNOSIS — E538 Deficiency of other specified B group vitamins: Secondary | ICD-10-CM

## 2014-02-17 MED ORDER — CYANOCOBALAMIN 1000 MCG/ML IJ SOLN: 1000.0000 ug | INTRAMUSCULAR | Status: DC

## 2014-02-17 NOTE — Telephone Encounter (Signed)
Spoke with pt, advised message from [place]. Pt understood. She is going to be in [place] and wants to know how often she should be getting the B12 injections. She is going to try to find a place in [place] to give them to her. Please advise.

## 2014-02-17 NOTE — Telephone Encounter (Signed)
pls verify that she is taking oral b12 every day.  If she is, then she will need b12 inj: 1 inj weekly for 4 weeks, then level should be checked again. I will write order. Please mail or fax to her. If she takes the script to a [facility], they will administer in clinic. (They have their own supply, but need a written script to administer.) Don't know if her insurance will cover cost.  Verify new address if maling.

## 2014-02-17 NOTE — Telephone Encounter (Signed)
She is taking oral sublingual Vit B12.

## 2014-02-23 NOTE — Progress Notes (Signed)
Subjective:     Emily [name] is a 53 y.o. female presents for follow up of new onset pain in R lower leg and for chronic conditions: depression, DM, B12 & d deficiencies, hyperlipidemia, iron deficiency anemia, and pruritis. R lower leg: Pt reports sudden onset pain in R leg-behind knee, while shopping. She has been walking for about 1 hour when she experienced pain. Pain radiated to foot & she noticed foot was red. & felt numb. SHe took vicodin, ibuprophen, and naprosyn with moderate relief. She went to ER for eval as she feared a blood clot. Blood clot & Baker's cyst were r/u w/ US doppler.  Today she has mild discomfort in leg. Given Hx of "corticated SI joints" I wonder if leg pain is coming from arthritis in back. She does not want to get xrays.  She saw Dr Trudie Reed (rheum) for tenosynovitis of wrist: steroid injection performed- some improvement, but persistent pain. Swelling is better. Saw derm regarding bullous rash, lesions were not biopsied. Pt was told to use steroid cream. She reports cardiology has released her-stopped spironolactone as bp was dropping too low. States she has gained 2 pounds overnight, so took lasix yesterday. Has not been following diet as carefully. Last A1C was very good: 6.3 from 9.4 Tomorrow she is leaving for [facility] and does not have plans to return to [facility]. SHe hopes to establish w/medical provider in [place]. She requests refills on meds & letter stating her medical diagnoses for new provider. Today, I will refill iron, tramadol, zoloft, metformin.  The following portions of the patient's history were reviewed and updated as appropriate: allergies, current medications, past medical history, past social history, past surgical history and problem list.  Review of Systems Constitutional: negative for fatigue and fevers Respiratory: negative for dyspnea on exertion, pleurisy/chest pain and still smoking Cardiovascular: negative for exertional chest pressure/discomfort,  irregular heart beat, lower extremity edema and palpitations Integument/breast: positive for pruritus and skin lesion(s) Musculoskeletal:negative for muscle weakness Behavioral/Psych: negative for anxiety and depression    Objective:    BP 115/75 mmHg  Pulse 77  Temp(Src) 97.8 F (36.6 C) (Temporal)  Ht 5\' 5"  (1.651 m)  Wt 147 lb (66.679 kg)  BMI 24.46 kg/m2  SpO2 95% BP 115/75 mmHg  Pulse 77  Temp(Src) 97.8 F (36.6 C) (Temporal)  Ht 5\' 5"  (1.651 m)  Wt 147 lb (66.679 kg)  BMI 24.46 kg/m2  SpO2 95% General appearance: alert, cooperative, appears stated age and no distress Head: Normocephalic, without obvious abnormality, atraumatic Eyes: negative findings: lids and lashes normal and conjunctivae and sclerae normal Back: decreased forward flexion-can barely reach knees. Tender SI joints Lungs: clear to auscultation bilaterally Heart: regular rate and rhythm, S1, S2 normal, no murmur, click, rub or gallop Extremities: no edema, cool toes, few bullous lesions & post-inflamm changes from blisters.  Pulses: 2+ and symmetric Skin: skin on fingers is tight to MCP joints, not able to make complete fist due to tight skin.    Assessment:Plan     1. Depressed - sertraline (ZOLOFT) 50 MG tablet; Take 1 tablet (50 mg total) by mouth at bedtime.  Dispense: 90 tablet; Refill: 1  2. Anemia, iron deficiency Assoc w/CHF - ferrous sulfate 325 (65 FE) MG tablet; Take 1 tablet (325 mg total) by mouth at bedtime.  Dispense: 90 tablet; Refill: 3 - CBC with Differential - Iron and TIBC  3. Diabetes mellitus type 2, uncontrolled controlled - metFORMIN (GLUCOPHAGE) 1000 MG tablet; Take 1 tablet (1,000  mg total) by mouth 2 (two) times daily with a meal.  Dispense: 180 tablet; Refill: 1 - Lipid panel - TSH - Vitamin B12 - Hemoglobin A1c - Comprehensive metabolic panel - Microalbumin / creatinine urine ratio  4. Chronic systolic heart failure  5. Pruritus Unknown etio Hydroxyzine helps  at night  6. Neuropathic pain, feet & legs unk etio DD: ankylosing spondylitis or other arthritis in back/pelvis  Needs eval by rheum or spine specialists  7. Vitamin D deficiency - Vit D  25 hydroxy (rtn osteoporosis monitoring)  Pt is to establish care w/provider in [place]. Spent 25 minutes w/pt.

## 2014-02-24 ENCOUNTER — Encounter: Payer: Self-pay | Admitting: Nurse Practitioner

## 2014-02-24 DIAGNOSIS — L299 Pruritus, unspecified: Secondary | ICD-10-CM | POA: Insufficient documentation

## 2014-02-24 DIAGNOSIS — E559 Vitamin D deficiency, unspecified: Secondary | ICD-10-CM | POA: Insufficient documentation

## 2014-03-05 ENCOUNTER — Encounter: Payer: Self-pay | Admitting: Nurse Practitioner

## 2014-03-18 ENCOUNTER — Telehealth: Payer: Self-pay | Admitting: *Deleted

## 2014-03-18 ENCOUNTER — Other Ambulatory Visit: Payer: Self-pay | Admitting: Nurse Practitioner

## 2014-03-18 ENCOUNTER — Encounter: Payer: Self-pay | Admitting: *Deleted

## 2014-03-18 ENCOUNTER — Telehealth: Payer: Self-pay

## 2014-03-18 DIAGNOSIS — G8929 Other chronic pain: Secondary | ICD-10-CM

## 2014-03-18 DIAGNOSIS — M533 Sacrococcygeal disorders, not elsewhere classified: Principal | ICD-10-CM

## 2014-03-18 MED ORDER — TRAMADOL HCL 50 MG PO TABS: 100.0000 mg | ORAL_TABLET | Freq: Two times a day (BID) | ORAL | Status: DC

## 2014-03-18 NOTE — Telephone Encounter (Signed)
Emily [name] from [facility] in [place] left a VM on the front desk VM stating that he needs an MD approval to fill a controlled substance. The pharmacist did not leave his phone number on the message.

## 2014-03-18 NOTE — Telephone Encounter (Addendum)
Patient stated that her tramadol is $60. Pt's insurance told her she can get Ultram 50 mg up to 240 tab per 30 days for less. Patient is using [facility] in [facility]. Please advise?

## 2014-03-19 ENCOUNTER — Other Ambulatory Visit: Payer: Self-pay | Admitting: *Deleted

## 2014-03-19 MED ORDER — TRAMADOL HCL 50 MG PO TABS: 100.0000 mg | ORAL_TABLET | Freq: Two times a day (BID) | ORAL | Status: DC

## 2014-03-19 NOTE — Telephone Encounter (Signed)
Please see last note from [facility] regarding filling tramadol. Cella will need to get a doc in [place] to prescribe these meds. The laws in [place] are different than in [place] for NPs. I can send to a [place] pharmacy, but not in [place].

## 2014-03-19 NOTE — Telephone Encounter (Signed)
I will ask him to sign for 30 day script. She needs to understand that I am asking him to take responsibility for a patient that he doesn't know.  She will need to make other arrangements until she sees [facility] provider. I will write script for [facility] pharmacy, if needed for Feb & March.   Print script & I will ask Dr Marcille Buffy to sign. Then please fax.

## 2014-03-19 NOTE — Telephone Encounter (Signed)
Patient notified. Patient still persists with wanting the rx filled. Patient wants to know if Dr. Anitra Lauth can fill rx? Patient stated that she has appt with new physician 05/13/14. Please advise?

## 2014-03-19 NOTE — Telephone Encounter (Signed)
Patient notified

## 2014-04-01 ENCOUNTER — Encounter: Payer: Self-pay | Admitting: Nurse Practitioner

## 2014-08-16 IMAGING — CR DG CHEST 1V PORT
1 series · 1 of 1 positions shown · non-contrast
Comparison: PA and lateral chest 10/26/2012.

CLINICAL DATA: Swan-Ganz catheter placement.

PORTABLE CHEST - 1 VIEW

[AP]
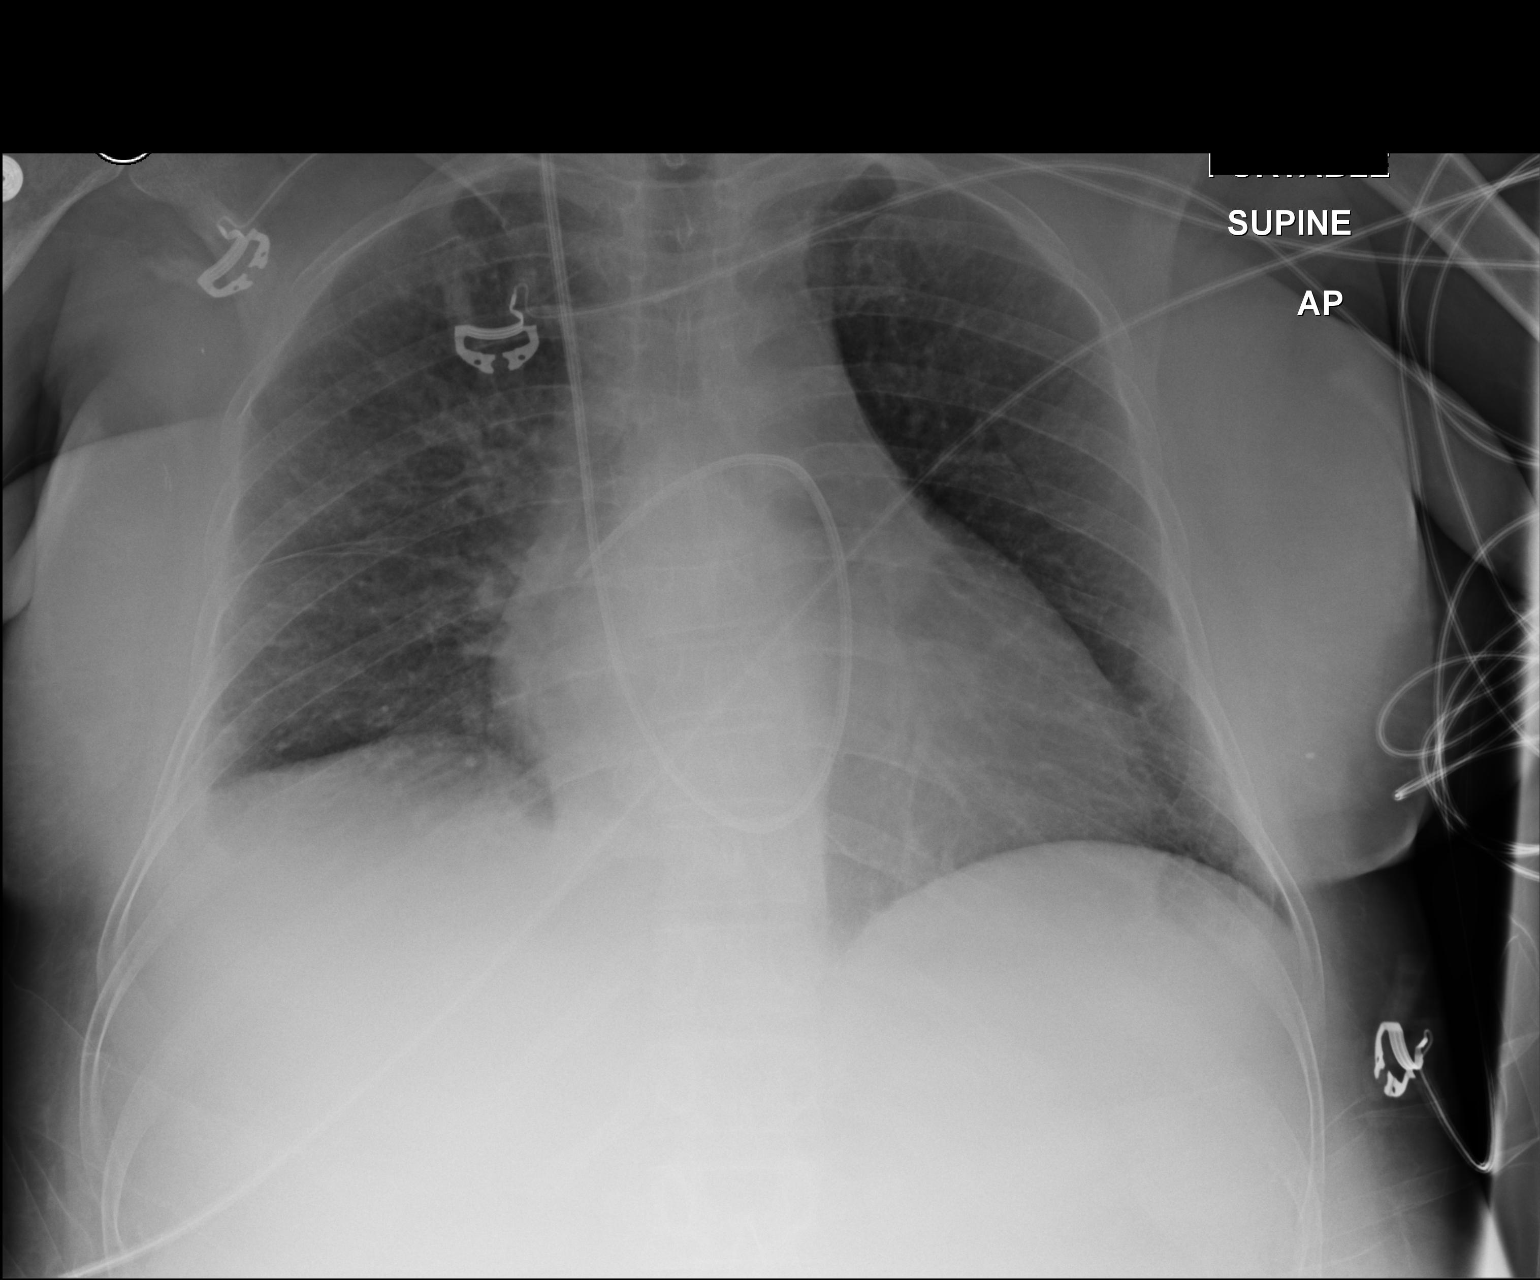

[1 of 1 positions shown; findings below may reference images not displayed]

FINDINGS: The patient has a new right IJ approach Swan-Ganz
catheter.  The tip of the catheter is in the proximal most
descending interlobar pulmonary artery on the right.  There is no
pneumothorax.

Lungs are clear.  Tiny right pleural effusion is noted.  There is
cardiomegaly.  No pulmonary edema.
IMPRESSION: 1.  Tip of right IJ catheter is in the proximal most descending
interlobar pulmonary artery on the right.  Negative for
pneumothorax.
2.  Trace right pleural effusion.

## 2014-08-24 ENCOUNTER — Other Ambulatory Visit: Payer: Self-pay

## 2014-09-15 ENCOUNTER — Other Ambulatory Visit (HOSPITAL_COMMUNITY): Payer: Self-pay | Admitting: Hematology & Oncology

## 2014-09-15 DIAGNOSIS — Z9889 Other specified postprocedural states: Secondary | ICD-10-CM

## 2014-09-28 ENCOUNTER — Emergency Department (HOSPITAL_COMMUNITY)
Admission: RE | Admit: 2014-09-28 | Discharge: 2014-09-28 | Disposition: A | Discharge status: Home / Self Care | Payer: Medicare Other | Attending: Emergency Medicine | Admitting: Emergency Medicine

## 2014-09-28 ENCOUNTER — Encounter (HOSPITAL_COMMUNITY): Payer: Self-pay | Admitting: *Deleted

## 2014-09-28 DIAGNOSIS — Z853 Personal history of malignant neoplasm of breast: Secondary | ICD-10-CM | POA: Insufficient documentation

## 2014-09-28 DIAGNOSIS — I10 Essential (primary) hypertension: Secondary | ICD-10-CM | POA: Diagnosis not present

## 2014-09-28 DIAGNOSIS — D509 Iron deficiency anemia, unspecified: Secondary | ICD-10-CM | POA: Insufficient documentation

## 2014-09-28 DIAGNOSIS — Z79899 Other long term (current) drug therapy: Secondary | ICD-10-CM | POA: Insufficient documentation

## 2014-09-28 DIAGNOSIS — Z8619 Personal history of other infectious and parasitic diseases: Secondary | ICD-10-CM | POA: Diagnosis not present

## 2014-09-28 DIAGNOSIS — E785 Hyperlipidemia, unspecified: Secondary | ICD-10-CM | POA: Diagnosis not present

## 2014-09-28 DIAGNOSIS — E1140 Type 2 diabetes mellitus with diabetic neuropathy, unspecified: Secondary | ICD-10-CM | POA: Diagnosis not present

## 2014-09-28 DIAGNOSIS — K219 Gastro-esophageal reflux disease without esophagitis: Secondary | ICD-10-CM | POA: Insufficient documentation

## 2014-09-28 DIAGNOSIS — M1990 Unspecified osteoarthritis, unspecified site: Secondary | ICD-10-CM | POA: Insufficient documentation

## 2014-09-28 DIAGNOSIS — F329 Major depressive disorder, single episode, unspecified: Secondary | ICD-10-CM | POA: Diagnosis not present

## 2014-09-28 DIAGNOSIS — Z7982 Long term (current) use of aspirin: Secondary | ICD-10-CM | POA: Insufficient documentation

## 2014-09-28 DIAGNOSIS — M5410 Radiculopathy, site unspecified: Secondary | ICD-10-CM | POA: Diagnosis not present

## 2014-09-28 DIAGNOSIS — E119 Type 2 diabetes mellitus without complications: Secondary | ICD-10-CM | POA: Insufficient documentation

## 2014-09-28 DIAGNOSIS — I509 Heart failure, unspecified: Secondary | ICD-10-CM | POA: Insufficient documentation

## 2014-09-28 DIAGNOSIS — F419 Anxiety disorder, unspecified: Secondary | ICD-10-CM | POA: Insufficient documentation

## 2014-09-28 DIAGNOSIS — R1030 Lower abdominal pain, unspecified: Secondary | ICD-10-CM | POA: Diagnosis present

## 2014-09-28 DIAGNOSIS — Z720 Tobacco use: Secondary | ICD-10-CM | POA: Diagnosis not present

## 2014-09-28 MED ORDER — PREDNISONE 20 MG PO TABS: ORAL_TABLET | ORAL | Status: DC

## 2014-09-28 MED ORDER — DEXAMETHASONE SODIUM PHOSPHATE 10 MG/ML IJ SOLN
10.0000 mg | Freq: Once | INTRAMUSCULAR | Status: AC
  Administered 2014-09-28: 10 mg via INTRAMUSCULAR
  Filled 2014-09-28: qty 1

## 2014-09-28 MED ORDER — CYCLOBENZAPRINE HCL 10 MG PO TABS: 10.0000 mg | ORAL_TABLET | Freq: Two times a day (BID) | ORAL | Status: DC | PRN

## 2014-09-28 MED ORDER — OXYCODONE-ACETAMINOPHEN 5-325 MG PO TABS
2.0000 | ORAL_TABLET | Freq: Once | ORAL | Status: AC
  Administered 2014-09-28: 2 via ORAL
  Filled 2014-09-28: qty 2

## 2014-09-28 NOTE — ED Notes (Signed)
PT was seen at [facility] in [place]. On SAT 30. For back pain that radiates down leg. Pt now reports LT groin pain at radiates to back . Pain is 10/10.
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
## 2014-09-28 NOTE — Discharge Instructions (Signed)

## 2014-09-29 ENCOUNTER — Encounter (HOSPITAL_COMMUNITY): Payer: Self-pay

## 2014-09-30 ENCOUNTER — Ambulatory Visit (INDEPENDENT_AMBULATORY_CARE_PROVIDER_SITE_OTHER): Payer: Medicare Other | Admitting: Family Medicine

## 2014-09-30 ENCOUNTER — Other Ambulatory Visit: Payer: Self-pay | Admitting: Family Medicine

## 2014-09-30 ENCOUNTER — Encounter: Payer: Self-pay | Admitting: Family Medicine

## 2014-09-30 VITALS — BP 108/74 | HR 87 | Temp 97.5°F | Ht 65.0 in | Wt 149.0 lb

## 2014-09-30 DIAGNOSIS — E1165 Type 2 diabetes mellitus with hyperglycemia: Secondary | ICD-10-CM

## 2014-09-30 DIAGNOSIS — D509 Iron deficiency anemia, unspecified: Secondary | ICD-10-CM

## 2014-09-30 DIAGNOSIS — E559 Vitamin D deficiency, unspecified: Secondary | ICD-10-CM | POA: Diagnosis not present

## 2014-09-30 DIAGNOSIS — M5432 Sciatica, left side: Secondary | ICD-10-CM | POA: Diagnosis not present

## 2014-09-30 DIAGNOSIS — M792 Neuralgia and neuritis, unspecified: Secondary | ICD-10-CM

## 2014-09-30 DIAGNOSIS — I2510 Atherosclerotic heart disease of native coronary artery without angina pectoris: Secondary | ICD-10-CM

## 2014-09-30 DIAGNOSIS — K760 Fatty (change of) liver, not elsewhere classified: Secondary | ICD-10-CM | POA: Diagnosis not present

## 2014-09-30 DIAGNOSIS — I2584 Coronary atherosclerosis due to calcified coronary lesion: Secondary | ICD-10-CM

## 2014-09-30 DIAGNOSIS — IMO0002 Reserved for concepts with insufficient information to code with codable children: Secondary | ICD-10-CM

## 2014-09-30 LAB — CBC WITH DIFFERENTIAL/PLATELET
Basophils Absolute: 0 10*3/uL (ref 0.0–0.1)
Basophils Relative: 0 % (ref 0–1)
EOS ABS: 0.1 10*3/uL (ref 0.0–0.7)
EOS PCT: 1 % (ref 0–5)
HEMATOCRIT: 42 % (ref 36.0–46.0)
HEMOGLOBIN: 14.2 g/dL (ref 12.0–15.0)
LYMPHS ABS: 2.4 10*3/uL (ref 0.7–4.0)
LYMPHS PCT: 16 % (ref 12–46)
MCH: 30.5 pg (ref 26.0–34.0)
MCHC: 33.8 g/dL (ref 30.0–36.0)
MCV: 90.3 fL (ref 78.0–100.0)
MONOS PCT: 1 % — AB (ref 3–12)
MPV: 11.4 fL (ref 8.6–12.4)
Monocytes Absolute: 0.1 10*3/uL (ref 0.1–1.0)
NEUTROS ABS: 12.2 10*3/uL — AB (ref 1.7–7.7)
Neutrophils Relative %: 82 % — ABNORMAL HIGH (ref 43–77)
Platelets: 219 10*3/uL (ref 150–400)
RBC: 4.65 MIL/uL (ref 3.87–5.11)
RDW: 13.6 % (ref 11.5–15.5)
WBC: 14.9 10*3/uL — AB (ref 4.0–10.5)

## 2014-09-30 LAB — IRON AND TIBC
%SAT: 17 % — ABNORMAL LOW (ref 20–55)
IRON: 72 ug/dL (ref 42–145)
TIBC: 427 ug/dL (ref 250–470)
UIBC: 355 ug/dL (ref 125–400)

## 2014-09-30 LAB — VITAMIN B12: Vitamin B-12: 575 pg/mL (ref 211–911)

## 2014-09-30 LAB — HEMOGLOBIN A1C
Hgb A1c MFr Bld: 7 % — ABNORMAL HIGH (ref <5.7)
MEAN PLASMA GLUCOSE: 154 mg/dL — AB (ref <117)

## 2014-09-30 LAB — MAGNESIUM: Magnesium: 1.6 mg/dL (ref 1.5–2.5)

## 2014-09-30 LAB — COMPREHENSIVE METABOLIC PANEL
ALT: 111 U/L — ABNORMAL HIGH (ref 6–29)
AST: 32 U/L (ref 10–35)
Albumin: 4.5 g/dL (ref 3.6–5.1)
Alkaline Phosphatase: 59 U/L (ref 33–130)
BILIRUBIN TOTAL: 0.3 mg/dL (ref 0.2–1.2)
BUN: 21 mg/dL (ref 7–25)
CO2: 22 mmol/L (ref 20–31)
Calcium: 9.5 mg/dL (ref 8.6–10.4)
Chloride: 98 mmol/L (ref 98–110)
Creat: 0.79 mg/dL (ref 0.50–1.05)
Glucose, Bld: 289 mg/dL — ABNORMAL HIGH (ref 65–99)
Potassium: 4.7 mmol/L (ref 3.5–5.3)
Sodium: 135 mmol/L (ref 135–146)
TOTAL PROTEIN: 7.2 g/dL (ref 6.1–8.1)

## 2014-09-30 LAB — LIPID PANEL
Cholesterol: 120 mg/dL — ABNORMAL LOW (ref 125–200)
HDL: 67 mg/dL (ref >=46)
LDL Cholesterol: 37 mg/dL (ref <130)
Total CHOL/HDL Ratio: 1.8 Ratio (ref <=5.0)
Triglycerides: 79 mg/dL (ref <150)
VLDL: 16 mg/dL (ref <30)

## 2014-09-30 MED ORDER — OXYCODONE-ACETAMINOPHEN 5-325 MG PO TABS: 1.0000 | ORAL_TABLET | Freq: Two times a day (BID) | ORAL | Status: DC | PRN

## 2014-09-30 MED ORDER — CYCLOBENZAPRINE HCL 10 MG PO TABS: 10.0000 mg | ORAL_TABLET | Freq: Three times a day (TID) | ORAL | Status: DC | PRN

## 2014-09-30 NOTE — Patient Instructions (Signed)
He will need to make an appointment to establish care with [place] so that we can go over all your chronic medical issues. Please do this before you go back to [place], and we will review the labs we collected today and discuss your diabetes management. Have including some information on sciatica. I have given you a short-term prescription for a pain medicine, Percocet, this is a one-time prescription only and should be reserved for severe pain only.  Sciatica Sciatica is pain, weakness, numbness, or tingling along the path of the sciatic nerve. The nerve starts in the lower back and runs down the back of each leg. The nerve controls the muscles in the lower leg and in the back of the knee, while also providing sensation to the back of the thigh, lower leg, and the sole of your foot. Sciatica is a symptom of another medical condition. For instance, nerve damage or certain conditions, such as a herniated disk or bone spur on the spine, pinch or put pressure on the sciatic nerve. This causes the pain, weakness, or other sensations normally associated with sciatica. Generally, sciatica only affects one side of the body. CAUSES   Herniated or slipped disc.  Degenerative disk disease.  A pain disorder involving the narrow muscle in the buttocks (piriformis syndrome).  Pelvic injury or fracture.  Pregnancy.  Tumor (rare). SYMPTOMS  Symptoms can vary from mild to very severe. The symptoms usually travel from the low back to the buttocks and down the back of the leg. Symptoms can include:  Mild tingling or dull aches in the lower back, leg, or hip.  Numbness in the back of the calf or sole of the foot.  Burning sensations in the lower back, leg, or hip.  Sharp pains in the lower back, leg, or hip.  Leg weakness.  Severe back pain inhibiting movement. These symptoms may get worse with coughing, sneezing, laughing, or prolonged sitting or standing. Also, being overweight may worsen  symptoms. DIAGNOSIS  Your caregiver will perform a physical exam to look for common symptoms of sciatica. He or she may ask you to do certain movements or activities that would trigger sciatic nerve pain. Other tests may be performed to find the cause of the sciatica. These may include:  Blood tests.  X-rays.  Imaging tests, such as an MRI or CT scan. TREATMENT  Treatment is directed at the cause of the sciatic pain. Sometimes, treatment is not necessary and the pain and discomfort goes away on its own. If treatment is needed, your caregiver may suggest:  Over-the-counter medicines to relieve pain.  Prescription medicines, such as anti-inflammatory medicine, muscle relaxants, or narcotics.  Applying heat or ice to the painful area.  Steroid injections to lessen pain, irritation, and inflammation around the nerve.  Reducing activity during periods of pain.  Exercising and stretching to strengthen your abdomen and improve flexibility of your spine. Your caregiver may suggest losing weight if the extra weight makes the back pain worse.  Physical therapy.  Surgery to eliminate what is pressing or pinching the nerve, such as a bone spur or part of a herniated disk. HOME CARE INSTRUCTIONS   Only take over-the-counter or prescription medicines for pain or discomfort as directed by your caregiver.  Apply ice to the affected area for 20 minutes, 3-4 times a day for the first 48-72 hours. Then try heat in the same way.  Exercise, stretch, or perform your usual activities if these do not aggravate your pain.  Attend  physical therapy sessions as directed by your caregiver.  Keep all follow-up appointments as directed by your caregiver.  Do not wear high heels or shoes that do not provide proper support.  Check your mattress to see if it is too soft. A firm mattress may lessen your pain and discomfort. SEEK IMMEDIATE MEDICAL CARE IF:   You lose control of your bowel or bladder  (incontinence).  You have increasing weakness in the lower back, pelvis, buttocks, or legs.  You have redness or swelling of your back.  You have a burning sensation when you urinate.  You have pain that gets worse when you lie down or awakens you at night.  Your pain is worse than you have experienced in the past.  Your pain is lasting longer than 4 weeks.  You are suddenly losing weight without reason. MAKE SURE YOU:  Understand these instructions.  Will watch your condition.  Will get help right away if you are not doing well or get worse. Document Released: 02/07/2001 Document Revised: 08/15/2011 Document Reviewed: 06/25/2011 [facility] Patient Information 2015 [place], [place]. This information is not intended to replace advice given to you by your health care provider. Make sure you discuss any questions you have with your health care provider.

## 2014-09-30 NOTE — Assessment & Plan Note (Signed)
Emily [name] is a 54 y.o. Caucasian female present for sciatica, left. - Continue prednisone, Flexeril, gabapentin and heat application. Start stretches. - Patient given AVS instructions and exercises/stretches/preventative care for sciatica. - Refill on Flexeril. Prescribed oxycodone 5-3 25, #20 twice a day when necessary for severe pain. Discussed with patient this would be a one-time prescription only. -  Discussed with patient, I would not encourage her to have 2 different PCPs managing her chronic  issues [place] two [place]. She was given a decision if she would like to have her chronic issues managed with this office and we would complete her lab work today and follow-up next week with lab results, medication management, chronic illness management. Patient stated that she would prefer to come here since she's in [place] most often. - We'll need to contact Dr. Royston Sinner at [facility] for family medicine in [place] to communicate changes. Phone # [phone].  - If patient asked for additional narcotic medication, she will be looked up in the [facility] in communication with her PCP in [place] concerning her [place] prescriptions/database. - Labs collected today: Vitamin D, magnesium, B-12, CMP, CBC, A1c, lipid profile, iron panel - Follow-up one week for lab results and chronic illness management.
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
## 2014-10-01 LAB — VITAMIN D 25 HYDROXY (VIT D DEFICIENCY, FRACTURES): Vit D, 25-Hydroxy: 50 ng/mL (ref 30–100)

## 2014-10-02 ENCOUNTER — Encounter (HOSPITAL_COMMUNITY): Payer: Self-pay | Admitting: Hematology & Oncology

## 2014-10-02 ENCOUNTER — Encounter (HOSPITAL_COMMUNITY): Payer: Medicare Other | Attending: Hematology & Oncology | Admitting: Hematology & Oncology

## 2014-10-02 ENCOUNTER — Ambulatory Visit (INDEPENDENT_AMBULATORY_CARE_PROVIDER_SITE_OTHER): Payer: Medicare Other | Admitting: Family Medicine

## 2014-10-02 ENCOUNTER — Encounter: Payer: Self-pay | Admitting: Family Medicine

## 2014-10-02 VITALS — BP 130/88 | HR 105 | Temp 97.6°F | Resp 18 | Ht 65.0 in | Wt 148.0 lb

## 2014-10-02 VITALS — BP 104/68 | HR 89 | Temp 97.6°F | Resp 18 | Wt 149.3 lb

## 2014-10-02 DIAGNOSIS — R062 Wheezing: Secondary | ICD-10-CM | POA: Diagnosis not present

## 2014-10-02 DIAGNOSIS — C50911 Malignant neoplasm of unspecified site of right female breast: Secondary | ICD-10-CM | POA: Diagnosis not present

## 2014-10-02 DIAGNOSIS — K760 Fatty (change of) liver, not elsewhere classified: Secondary | ICD-10-CM

## 2014-10-02 DIAGNOSIS — Z8739 Personal history of other diseases of the musculoskeletal system and connective tissue: Secondary | ICD-10-CM | POA: Diagnosis not present

## 2014-10-02 DIAGNOSIS — E1165 Type 2 diabetes mellitus with hyperglycemia: Secondary | ICD-10-CM

## 2014-10-02 DIAGNOSIS — Z720 Tobacco use: Secondary | ICD-10-CM | POA: Diagnosis not present

## 2014-10-02 DIAGNOSIS — Z853 Personal history of malignant neoplasm of breast: Secondary | ICD-10-CM | POA: Diagnosis not present

## 2014-10-02 DIAGNOSIS — IMO0002 Reserved for concepts with insufficient information to code with codable children: Secondary | ICD-10-CM

## 2014-10-02 DIAGNOSIS — I5022 Chronic systolic (congestive) heart failure: Secondary | ICD-10-CM | POA: Diagnosis not present

## 2014-10-02 DIAGNOSIS — F17200 Nicotine dependence, unspecified, uncomplicated: Secondary | ICD-10-CM

## 2014-10-02 DIAGNOSIS — L409 Psoriasis, unspecified: Secondary | ICD-10-CM

## 2014-10-02 MED ORDER — ALBUTEROL SULFATE HFA 108 (90 BASE) MCG/ACT IN AERS: 2.0000 | INHALATION_SPRAY | Freq: Four times a day (QID) | RESPIRATORY_TRACT | Status: DC | PRN

## 2014-10-02 MED ORDER — LETROZOLE 2.5 MG PO TABS: 2.5000 mg | ORAL_TABLET | Freq: Every morning | ORAL | Status: DC

## 2014-10-02 NOTE — Progress Notes (Signed)
Pre visit review using our clinic review tool, if applicable. No additional management support is needed unless otherwise documented below in the visit note. 
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
## 2014-10-02 NOTE — Patient Instructions (Signed)
I want to see you in the next 3-4 months.  I will call you with the echo result once it comes available.  We will collect labs at that time and you will need to schedule a pulmonary appt at that time as well.  Dry weight is 148, take your lasix as directed if greater than 3-5 lbs weight gain in a day.  Good luck on stopping smoking.  Make certain to have records sent to [place].

## 2014-10-02 NOTE — Patient Instructions (Signed)
..  [place] at [facility] Discharge Instructions  RECOMMENDATIONS MADE BY THE CONSULTANT AND ANY TEST RESULTS WILL BE SENT TO YOUR REFERRING PHYSICIAN.  Seen and discussion with Dr. Whitney Muse.   Call the cancer center with any questions and/or concerns that you have.  Mammogram 10/06/14 at [facility].  Follow up appt in 1 year.    Thank you for choosing [place] at [facility] to provide your oncology and hematology care.  To afford each patient quality time with our provider, please arrive at least 15 minutes before your scheduled appointment time.    You need to re-schedule your appointment should you arrive 10 or more minutes late.  We strive to give you quality time with our providers, and arriving late affects you and other patients whose appointments are after yours.  Also, if you no show three or more times for appointments you may be dismissed from the clinic at the providers discretion.     Again, thank you for choosing [facility].  Our hope is that these requests will decrease the amount of time that you wait before being seen by our physicians.       _____________________________________________________________  Should you have questions after your visit to [facility], please contact our office at [phone] between the hours of 8:30 a.m. and 4:30 p.m.  Voicemails left after 4:30 p.m. will not be returned until the following business day.  For prescription refill requests, have your pharmacy contact our office.

## 2014-10-05 ENCOUNTER — Ambulatory Visit (HOSPITAL_COMMUNITY)
Admission: RE | Admit: 2014-10-05 | Discharge: 2014-10-05 | Disposition: A | Discharge status: Home / Self Care | Payer: Medicare Other | Source: Ambulatory Visit | Attending: Hematology & Oncology | Admitting: Hematology & Oncology

## 2014-10-05 DIAGNOSIS — Z8739 Personal history of other diseases of the musculoskeletal system and connective tissue: Secondary | ICD-10-CM | POA: Insufficient documentation

## 2014-10-06 ENCOUNTER — Ambulatory Visit (HOSPITAL_COMMUNITY)
Admission: RE | Admit: 2014-10-06 | Discharge: 2014-10-06 | Disposition: A | Discharge status: Home / Self Care | Payer: Medicare Other | Source: Ambulatory Visit | Attending: Hematology & Oncology | Admitting: Hematology & Oncology

## 2014-10-06 ENCOUNTER — Ambulatory Visit (HOSPITAL_BASED_OUTPATIENT_CLINIC_OR_DEPARTMENT_OTHER)
Admission: RE | Admit: 2014-10-06 | Discharge: 2014-10-06 | Disposition: A | Discharge status: Home / Self Care | Payer: Medicare Other | Source: Ambulatory Visit | Attending: Family Medicine | Admitting: Family Medicine

## 2014-10-06 DIAGNOSIS — Z853 Personal history of malignant neoplasm of breast: Secondary | ICD-10-CM | POA: Diagnosis not present

## 2014-10-06 DIAGNOSIS — I340 Nonrheumatic mitral (valve) insufficiency: Secondary | ICD-10-CM | POA: Diagnosis not present

## 2014-10-06 DIAGNOSIS — I5022 Chronic systolic (congestive) heart failure: Secondary | ICD-10-CM | POA: Diagnosis not present

## 2014-10-06 DIAGNOSIS — Z9889 Other specified postprocedural states: Secondary | ICD-10-CM

## 2014-10-06 DIAGNOSIS — I509 Heart failure, unspecified: Secondary | ICD-10-CM | POA: Diagnosis present

## 2014-10-08 ENCOUNTER — Telehealth: Payer: Self-pay | Admitting: Family Medicine

## 2014-10-08 NOTE — Telephone Encounter (Signed)
LMOM for pt to CB.  

## 2014-10-08 NOTE — Telephone Encounter (Signed)
Please call pt, her echocardiogram result is good. No concerns with pumping action of her heart. We will need to repeat her carotid duplex (study on her vessels in her neck) on her next visit. This will also need to be followed yearly, her last study was 10/2013 and there was concerns for increasing blockage in those vessels, which could lead to a stroke. Encourage her to continue aspirin and Lipitor to help prevent progression. Follow-up in November as we discussed.

## 2014-10-09 NOTE — Telephone Encounter (Signed)
LMOM for pt to CB.  

## 2014-10-09 NOTE — Telephone Encounter (Signed)
Patient aware of results and recommendations.  She plans to f/u in November.

## 2014-10-29 ENCOUNTER — Telehealth (HOSPITAL_COMMUNITY): Payer: Self-pay

## 2014-10-29 ENCOUNTER — Other Ambulatory Visit (HOSPITAL_COMMUNITY): Payer: Self-pay

## 2014-10-29 MED ORDER — ALENDRONATE SODIUM 70 MG PO TABS: 70.0000 mg | ORAL_TABLET | ORAL | Status: DC

## 2014-10-29 NOTE — Telephone Encounter (Signed)
Patient notified and is agreeable to taking fosamax weekly and is already taking calcium and vitamin D.  Discussed with Robynn Pane, PA-C and prescription e-scribed to her pharmacy.

## 2014-10-29 NOTE — Telephone Encounter (Signed)
-----   Message from Patrici Ranks, MD sent at 10/23/2014  8:42 AM EDT ----- Really worried about her bones and ongoing letrozole use.  See if she will let us call in Fosamax once weekly 70 mg po. Make sure taking calcium 1200mg  daily and vitamin D at least 2000 IU daily. Dr.P

## 2014-11-07 ENCOUNTER — Encounter (HOSPITAL_COMMUNITY): Payer: Self-pay | Admitting: Hematology & Oncology

## 2014-12-04 ENCOUNTER — Encounter: Payer: Self-pay | Admitting: Family Medicine

## 2015-06-21 ENCOUNTER — Telehealth (HOSPITAL_COMMUNITY): Payer: Self-pay | Admitting: *Deleted

## 2015-06-21 NOTE — Telephone Encounter (Signed)
Pt called, she is concerned she is having HF symptoms again.  She states she became very SOB and went to local ER in [place], [place] over the weekend.  They did a chest x-ray which showed fluid so they did a chest CT which showed a nodule on right and left lung, bnp was 355.  She states they gave her lasix and several breathing treatments and sent her home and told her to take Lasix 40 mg BID.  She is sch to see her PCP there tomorrow and is going to ask him about doing an echo.  She states she is going to sign release to get records sent to [place] and would like Dr Haroldine Laws to review everything, she would like to come up here to see him, but can't come until early May.  Advised pt ok to send [place] records, see pcp for med doses and ? Getting an echo.  Once she gets his input she will call us back to get appt sch here.

## 2015-06-28 ENCOUNTER — Telehealth (HOSPITAL_COMMUNITY): Payer: Self-pay | Admitting: Vascular Surgery

## 2015-06-28 ENCOUNTER — Encounter: Payer: Self-pay | Admitting: Internal Medicine

## 2015-06-29 ENCOUNTER — Encounter (HOSPITAL_COMMUNITY): Payer: Self-pay | Admitting: Internal Medicine

## 2015-06-29 ENCOUNTER — Telehealth (HOSPITAL_COMMUNITY): Payer: Self-pay | Admitting: *Deleted

## 2015-06-29 DIAGNOSIS — I5022 Chronic systolic (congestive) heart failure: Secondary | ICD-10-CM

## 2015-06-29 NOTE — Telephone Encounter (Signed)
Pt called to let us know she is coming to [facility] for 2 weeks and would like to see Dr Haroldine Laws, she states she was suppose to get echo in [place] but had to cancel her appt b/c she also had a mass to come up on her neck she had to get removed.  She would like to have echo done here, ok per Dr Haroldine Laws, order placed and sch, pt aware

## 2015-07-08 ENCOUNTER — Ambulatory Visit (HOSPITAL_BASED_OUTPATIENT_CLINIC_OR_DEPARTMENT_OTHER)
Admission: RE | Admit: 2015-07-08 | Discharge: 2015-07-08 | Disposition: A | Discharge status: Home / Self Care | Payer: Medicare Other | Source: Ambulatory Visit | Attending: Internal Medicine | Admitting: Internal Medicine

## 2015-07-08 ENCOUNTER — Encounter (HOSPITAL_COMMUNITY): Payer: Self-pay | Admitting: Internal Medicine

## 2015-07-08 ENCOUNTER — Encounter (HOSPITAL_COMMUNITY): Payer: Self-pay | Admitting: *Deleted

## 2015-07-08 ENCOUNTER — Ambulatory Visit (HOSPITAL_COMMUNITY)
Admission: RE | Admit: 2015-07-08 | Discharge: 2015-07-08 | Disposition: A | Discharge status: Home / Self Care | Payer: Medicare Other | Source: Ambulatory Visit | Attending: Family Medicine | Admitting: Family Medicine

## 2015-07-08 VITALS — BP 126/70 | HR 91 | Wt 154.5 lb

## 2015-07-08 DIAGNOSIS — I071 Rheumatic tricuspid insufficiency: Secondary | ICD-10-CM | POA: Diagnosis not present

## 2015-07-08 DIAGNOSIS — I358 Other nonrheumatic aortic valve disorders: Secondary | ICD-10-CM | POA: Insufficient documentation

## 2015-07-08 DIAGNOSIS — I5023 Acute on chronic systolic (congestive) heart failure: Secondary | ICD-10-CM | POA: Diagnosis not present

## 2015-07-08 DIAGNOSIS — I509 Heart failure, unspecified: Secondary | ICD-10-CM | POA: Diagnosis present

## 2015-07-08 DIAGNOSIS — I340 Nonrheumatic mitral (valve) insufficiency: Secondary | ICD-10-CM | POA: Insufficient documentation

## 2015-07-08 DIAGNOSIS — E785 Hyperlipidemia, unspecified: Secondary | ICD-10-CM | POA: Diagnosis not present

## 2015-07-08 DIAGNOSIS — I517 Cardiomegaly: Secondary | ICD-10-CM | POA: Diagnosis not present

## 2015-07-08 DIAGNOSIS — I5022 Chronic systolic (congestive) heart failure: Secondary | ICD-10-CM

## 2015-07-08 DIAGNOSIS — E119 Type 2 diabetes mellitus without complications: Secondary | ICD-10-CM | POA: Diagnosis not present

## 2015-07-08 DIAGNOSIS — I2510 Atherosclerotic heart disease of native coronary artery without angina pectoris: Secondary | ICD-10-CM | POA: Insufficient documentation

## 2015-07-08 DIAGNOSIS — R9431 Abnormal electrocardiogram [ECG] [EKG]: Secondary | ICD-10-CM | POA: Diagnosis not present

## 2015-07-08 DIAGNOSIS — R000 Tachycardia, unspecified: Secondary | ICD-10-CM | POA: Diagnosis not present

## 2015-07-08 DIAGNOSIS — Z720 Tobacco use: Secondary | ICD-10-CM | POA: Diagnosis not present

## 2015-07-08 LAB — COMPREHENSIVE METABOLIC PANEL
ALBUMIN: 3.9 g/dL (ref 3.5–5.0)
ALT: 19 U/L (ref 14–54)
AST: 26 U/L (ref 15–41)
Alkaline Phosphatase: 46 U/L (ref 38–126)
Anion gap: 20 — ABNORMAL HIGH (ref 5–15)
BUN: 12 mg/dL (ref 6–20)
CHLORIDE: 94 mmol/L — AB (ref 101–111)
CO2: 25 mmol/L (ref 22–32)
CREATININE: 1.14 mg/dL — AB (ref 0.44–1.00)
Calcium: 8.9 mg/dL (ref 8.9–10.3)
GFR calc non Af Amer: 53 mL/min — ABNORMAL LOW (ref >60)
GLUCOSE: 145 mg/dL — AB (ref 65–99)
Potassium: 3.7 mmol/L (ref 3.5–5.1)
SODIUM: 139 mmol/L (ref 135–145)
Total Bilirubin: 0.7 mg/dL (ref 0.3–1.2)
Total Protein: 7 g/dL (ref 6.5–8.1)

## 2015-07-08 LAB — CBC
HCT: 36.5 % (ref 36.0–46.0)
HEMOGLOBIN: 11.8 g/dL — AB (ref 12.0–15.0)
MCH: 28.9 pg (ref 26.0–34.0)
MCHC: 32.3 g/dL (ref 30.0–36.0)
MCV: 89.5 fL (ref 78.0–100.0)
Platelets: 238 10*3/uL (ref 150–400)
RBC: 4.08 MIL/uL (ref 3.87–5.11)
RDW: 14.2 % (ref 11.5–15.5)
WBC: 11.9 10*3/uL — ABNORMAL HIGH (ref 4.0–10.5)

## 2015-07-08 LAB — BRAIN NATRIURETIC PEPTIDE: B Natriuretic Peptide: 331.2 pg/mL — ABNORMAL HIGH (ref 0.0–100.0)

## 2015-07-08 MED ORDER — DIGOXIN 125 MCG PO TABS: 0.1250 mg | ORAL_TABLET | Freq: Every day | ORAL | Status: DC

## 2015-07-08 MED ORDER — SACUBITRIL-VALSARTAN 24-26 MG PO TABS: 1.0000 | ORAL_TABLET | Freq: Two times a day (BID) | ORAL | Status: DC

## 2015-07-08 NOTE — Progress Notes (Signed)
ADVANCED HF CLINIC NOTE  Patient ID: Emily [name], female   DOB: Jan 26, 1961, 55 y.o.   MRN: [id number] Patient ID: Emily [name], female   DOB: May 07, 1960, 55 y.o.   MRN: [id number] PCP: Dr Kathlen Mody Primary Cardiologist: Dr. Haroldine Laws  [name] is a 55 yo woman with a history of ER + R breast cancer s/p R lumpectomy, DM2, cholelithiasis s/p cholecystectomy (02/2013), COPD and systolic CHF due to nonischemic cardiomyopathy.  Patient was admitted in 09/2012 with acute pulmonary edema and cardiogenic shock.  LHC showed moderate nonobstructive CAD (40-50% mLAD, 50-70% LCx, 50% PDA).  She was started on milrinone and diuresed.  Cause of cardiomyopathy suspected to be Adriamycin toxicity.    ECHO 10/27/12 EF 20-25% ECHO 01/09/13 EF 30-35% ECHO 06/05/13 EF 45-50% septum dysynnergic  12/05/12 Carotid Dopplers- R 39 % stenosis and L  40-59% stenosis  Follow-up: She has moved to [place], [place] but returns for an re-evaluation of her HF. I have not seen her since 9/15. At last visit her heart function had returned to normal. 3 weeks ago developed orthopnea. Was admitted 2 weeks ago for 1 night due to ADHF. Diuresed 8 pounds. No echo done. Recently had surgery to remove R chest leiomyosarcoma. Margins not clear so needs larger resection. SOB with just mild exertion. Mild LE edema. + ab bloating. Still smoking. No dizziness. No CP.    Echo today EF 15-20% RV ok.   Lexiscan 09/23/13: EF 50% normal perfusion Holter: SR occasional PVCs  Labs (9/14): digoxin 0.4, K 3.6, creatinine 0.62 Labs (9/15): K 4.4 creatinine 0.68 Labs (10/26):    K 3.7 Cr 0.67 Labs: (03/20/2013): K+ 4.6, Cr 0.73 Labs (09/25/13): K 4.2 Cr 0.9  PMH: 1. COPD: PFTs (1/14) with FEV1 68%.  Quit smoking 9/14.  2. Type II diabetes  3. HTN 4. Breast cancer: 2009, s/p Adriamycin/Cytoxan/Taxol chemo, lumpectomy and radiation.   5. Nonischemic cardiomyopathy: Possibly due to Adriamycin.  Echo (8/14) with EF 20-25%, global hypokinesis, moderate  diastolic dysfunction, normal RV size and with mild to moderately decreased systolic function, moderate TR.   6. CAD: LHC (8/14) with nonobstructive CAD; 40-50% mLAD, 50-70% LCx, 50% PDA.  7. Hyperlipidemia  SH: Lives alone in [place].  Mother is next door.  Still smoking.   FH: No premature CAD.  No family history of cardiomyopathy.   ROS: All systems reviewed and negative except as per HPI.   Current Outpatient Prescriptions  Medication Sig Dispense Refill  . albuterol (PROVENTIL HFA;VENTOLIN HFA) 108 (90 BASE) MCG/ACT inhaler Inhale 2 puffs into the lungs every 6 (six) hours as needed for wheezing or shortness of breath. 1 Inhaler 0  . aspirin EC 81 MG tablet Take 1 tablet (81 mg total) by mouth daily. 90 tablet 3  . atorvastatin (LIPITOR) 20 MG tablet Take 1 tablet (20 mg total) by mouth daily. 90 tablet 3  . carvedilol (COREG) 6.25 MG tablet Take 1 tablet (6.25 mg total) by mouth 2 (two) times daily with a meal. 180 tablet 3  . Cholecalciferol (VITAMIN D) 2000 UNITS CAPS Take 1 capsule by mouth daily.    . enalapril (VASOTEC) 10 MG tablet Take 0.5 tablets (5 mg total) by mouth 2 (two) times daily. 180 tablet 3  . furosemide (LASIX) 40 MG tablet Take 40 mg by mouth 2 (two) times daily.    Marland Kitchen gabapentin (NEURONTIN) 400 MG capsule Take 2 capsules (800 mg total) by mouth 3 (three) times daily. 180 capsule 2  .  glucose blood test strip Test bid. 180 each 3  . ibuprofen (ADVIL,MOTRIN) 200 MG tablet Take 200 mg by mouth every 6 (six) hours as needed for mild pain.    Marland Kitchen letrozole (FEMARA) 2.5 MG tablet Take 1 tablet (2.5 mg total) by mouth every morning. 90 tablet 2  . loperamide (IMODIUM) 2 MG capsule Take 1 capsule (2 mg total) by mouth daily as needed for diarrhea or loose stools. 90 capsule 0  . LORazepam (ATIVAN) 2 MG tablet Take 1 tablet (2 mg total) by mouth at bedtime. 90 tablet 1  . metFORMIN (GLUCOPHAGE) 1000 MG tablet Take 1 tablet (1,000 mg total) by mouth 2 (two) times daily with  a meal. 180 tablet 1  . venlafaxine XR (EFFEXOR-XR) 150 MG 24 hr capsule Take 150 mg by mouth daily with breakfast.     No current facility-administered medications for this encounter.    Filed Vitals:   07/08/15 1423  BP: 126/70  Pulse: 91  Weight: 154 lb 8 oz (70.081 kg)  SpO2: 98%    General: NAD Neck: JVP flat, no thyromegaly or thyroid nodule.  Lungs: Clear to auscultation bilaterally with normal respiratory effort. Decreased BS throughout.  CV: Laterally displaced PMI.  Heart regular S1/S2, no S3/S4, no murmur.  1+ edema.  No carotid bruit.  Normal pedal pulses Abdomen: Soft, nontender, no hepatosplenomegaly, no distention. R scar Skin: Intact without lesions or rashes.  Neurologic: Alert and oriented x 3.  Psych: Normal affect. Extremities: No clubbing or cyanosis. DPs 1+ bilateral  HEENT: Normal.   Assessment/Plan: 1. Chronic systolic CHF: NICM, likely Adriamycin toxicity,. EF had previously recovered but now way back down about 20% - Tenuous. NYHA III. Mild volume overload. - Start Digoxin 0.125mg  daily. - Stop Enalapril. 36 hour washout. Then start Entresto 24/26 twice daily.  - Continue coreg 6.25 mg BID, lasix 40 bid and spiro for now. - Given h/o CAD and ongoing tobacco use falling EF  will need repeat R/L heart cath 2. CAD: Moderate nonobstructive disease previously. Will repeat cath. 3. COPD: Still smoking. I have encouraged her to stop once again 4. Current smoker- Encouraged to stop smoking she declines smoking cessation.  5. Breast Cancer: received adriamycin. Cancer free for > 5 years. 6. Carotid stenosis: asymptomatic. Will get yearly u/s. Continue asa and statin.   Bensimhon, Daniel,MD 3:11 PM

## 2015-07-08 NOTE — Progress Notes (Signed)
*  PRELIMINARY RESULTS* Echocardiogram 2D Echocardiogram has been performed.  Leavy Cella 07/08/2015, 1:44 PM

## 2015-07-08 NOTE — Patient Instructions (Addendum)
Start Digoxin 0.125mg  daily.  Stop Enalapril.  Start Entresto 24/26 twice daily. (START 36 HOURS AFTER STOPPING ENALAPRIL)  First Dose: Saturday 07/10/15   Routine lab work today. Will notify you of abnormal results  Your provider has scheduled you for a Right and Left Heart Cath on Wednesday May 17th arriving at [facility] at 6:30am.  Follow up with Dr.Bensimhon in 2-3 weeks

## 2015-07-11 DIAGNOSIS — I5023 Acute on chronic systolic (congestive) heart failure: Secondary | ICD-10-CM | POA: Insufficient documentation

## 2015-07-13 ENCOUNTER — Telehealth (HOSPITAL_COMMUNITY): Payer: Self-pay | Admitting: Pharmacist

## 2015-07-13 NOTE — Telephone Encounter (Addendum)
Patient called with concerns about Entresto copay cost of $64/mo. I have enrolled her in the [employer] foundation so that she will have $800 toward her copay costs through 07/11/16. Relayed info to [facility] in [place].   Billing ID: [id number] Person Code: [id number] RX Group: [id number] RX BIN: [id number] PCN for Part D: [id number]   [name]. Velva Harman, PharmD, BCPS, CPP Clinical Pharmacist Pager: [phone] Phone: [phone] 07/13/2015 4:59 PM

## 2015-07-14 ENCOUNTER — Inpatient Hospital Stay (HOSPITAL_COMMUNITY)
Admission: RE | Admit: 2015-07-14 | Discharge: 2015-07-23 | DRG: 919 | Disposition: A | Discharge status: Home / Self Care | Payer: Medicare Other | Source: Ambulatory Visit | Attending: Internal Medicine | Admitting: Internal Medicine

## 2015-07-14 ENCOUNTER — Encounter (HOSPITAL_COMMUNITY): Payer: Self-pay | Admitting: *Deleted

## 2015-07-14 ENCOUNTER — Encounter (HOSPITAL_COMMUNITY)
Admission: RE | Disposition: A | Discharge status: Home / Self Care | Payer: Self-pay | Source: Ambulatory Visit | Attending: Internal Medicine

## 2015-07-14 ENCOUNTER — Other Ambulatory Visit (HOSPITAL_COMMUNITY): Payer: Self-pay | Admitting: Pharmacist

## 2015-07-14 DIAGNOSIS — Z853 Personal history of malignant neoplasm of breast: Secondary | ICD-10-CM | POA: Diagnosis not present

## 2015-07-14 DIAGNOSIS — Z515 Encounter for palliative care: Secondary | ICD-10-CM | POA: Diagnosis not present

## 2015-07-14 DIAGNOSIS — M1990 Unspecified osteoarthritis, unspecified site: Secondary | ICD-10-CM | POA: Diagnosis present

## 2015-07-14 DIAGNOSIS — K219 Gastro-esophageal reflux disease without esophagitis: Secondary | ICD-10-CM | POA: Diagnosis present

## 2015-07-14 DIAGNOSIS — F329 Major depressive disorder, single episode, unspecified: Secondary | ICD-10-CM | POA: Diagnosis present

## 2015-07-14 DIAGNOSIS — E876 Hypokalemia: Secondary | ICD-10-CM | POA: Diagnosis present

## 2015-07-14 DIAGNOSIS — F419 Anxiety disorder, unspecified: Secondary | ICD-10-CM | POA: Diagnosis present

## 2015-07-14 DIAGNOSIS — I429 Cardiomyopathy, unspecified: Secondary | ICD-10-CM | POA: Diagnosis present

## 2015-07-14 DIAGNOSIS — I472 Ventricular tachycardia: Secondary | ICD-10-CM | POA: Diagnosis present

## 2015-07-14 DIAGNOSIS — E785 Hyperlipidemia, unspecified: Secondary | ICD-10-CM | POA: Diagnosis present

## 2015-07-14 DIAGNOSIS — I2510 Atherosclerotic heart disease of native coronary artery without angina pectoris: Secondary | ICD-10-CM

## 2015-07-14 DIAGNOSIS — I6529 Occlusion and stenosis of unspecified carotid artery: Secondary | ICD-10-CM | POA: Diagnosis present

## 2015-07-14 DIAGNOSIS — R570 Cardiogenic shock: Secondary | ICD-10-CM

## 2015-07-14 DIAGNOSIS — I5023 Acute on chronic systolic (congestive) heart failure: Secondary | ICD-10-CM

## 2015-07-14 DIAGNOSIS — J449 Chronic obstructive pulmonary disease, unspecified: Secondary | ICD-10-CM | POA: Diagnosis present

## 2015-07-14 DIAGNOSIS — I110 Hypertensive heart disease with heart failure: Secondary | ICD-10-CM | POA: Diagnosis present

## 2015-07-14 DIAGNOSIS — Y848 Other medical procedures as the cause of abnormal reaction of the patient, or of later complication, without mention of misadventure at the time of the procedure: Secondary | ICD-10-CM | POA: Diagnosis not present

## 2015-07-14 DIAGNOSIS — Z79899 Other long term (current) drug therapy: Secondary | ICD-10-CM | POA: Diagnosis not present

## 2015-07-14 DIAGNOSIS — E1140 Type 2 diabetes mellitus with diabetic neuropathy, unspecified: Secondary | ICD-10-CM | POA: Diagnosis present

## 2015-07-14 DIAGNOSIS — Z7984 Long term (current) use of oral hypoglycemic drugs: Secondary | ICD-10-CM | POA: Diagnosis not present

## 2015-07-14 DIAGNOSIS — F1410 Cocaine abuse, uncomplicated: Secondary | ICD-10-CM | POA: Diagnosis present

## 2015-07-14 DIAGNOSIS — T8111XA Postprocedural  cardiogenic shock, initial encounter: Secondary | ICD-10-CM | POA: Diagnosis present

## 2015-07-14 DIAGNOSIS — G4700 Insomnia, unspecified: Secondary | ICD-10-CM | POA: Diagnosis present

## 2015-07-14 DIAGNOSIS — Z7982 Long term (current) use of aspirin: Secondary | ICD-10-CM

## 2015-07-14 DIAGNOSIS — F17210 Nicotine dependence, cigarettes, uncomplicated: Secondary | ICD-10-CM | POA: Diagnosis present

## 2015-07-14 DIAGNOSIS — Z7189 Other specified counseling: Secondary | ICD-10-CM | POA: Diagnosis not present

## 2015-07-14 DIAGNOSIS — F32A Depression, unspecified: Secondary | ICD-10-CM | POA: Insufficient documentation

## 2015-07-14 HISTORY — PX: CARDIAC CATHETERIZATION: SHX172

## 2015-07-14 LAB — POCT I-STAT 3, ART BLOOD GAS (G3+)
Acid-Base Excess: 2 mmol/L (ref 0.0–2.0)
BICARBONATE: 27.1 meq/L — AB (ref 20.0–24.0)
O2 Saturation: 90 %
PH ART: 7.412 (ref 7.350–7.450)
PO2 ART: 59 mmHg — AB (ref 80.0–100.0)
TCO2: 28 mmol/L (ref 0–100)
pCO2 arterial: 42.6 mmHg (ref 35.0–45.0)

## 2015-07-14 LAB — CBC
HEMATOCRIT: 37.5 % (ref 36.0–46.0)
HEMOGLOBIN: 12.3 g/dL (ref 12.0–15.0)
MCH: 29.4 pg (ref 26.0–34.0)
MCHC: 32.8 g/dL (ref 30.0–36.0)
MCV: 89.7 fL (ref 78.0–100.0)
Platelets: 232 10*3/uL (ref 150–400)
RBC: 4.18 MIL/uL (ref 3.87–5.11)
RDW: 14 % (ref 11.5–15.5)
WBC: 12.5 10*3/uL — ABNORMAL HIGH (ref 4.0–10.5)

## 2015-07-14 LAB — POCT I-STAT 3, VENOUS BLOOD GAS (G3P V)
ACID-BASE EXCESS: 4 mmol/L — AB (ref 0.0–2.0)
ACID-BASE EXCESS: 4 mmol/L — AB (ref 0.0–2.0)
Acid-Base Excess: 4 mmol/L — ABNORMAL HIGH (ref 0.0–2.0)
BICARBONATE: 30.6 meq/L — AB (ref 20.0–24.0)
BICARBONATE: 30.8 meq/L — AB (ref 20.0–24.0)
Bicarbonate: 30.7 mEq/L — ABNORMAL HIGH (ref 20.0–24.0)
O2 SAT: 34 %
O2 SAT: 54 %
O2 Saturation: 42 %
PCO2 VEN: 55.8 mmHg — AB (ref 45.0–50.0)
PH VEN: 7.359 — AB (ref 7.250–7.300)
PH VEN: 7.387 — AB (ref 7.250–7.300)
PO2 VEN: 29 mmHg — AB (ref 31.0–45.0)
TCO2: 32 mmol/L (ref 0–100)
TCO2: 32 mmol/L (ref 0–100)
TCO2: 32 mmol/L (ref 0–100)
pCO2, Ven: 51.1 mmHg — ABNORMAL HIGH (ref 45.0–50.0)
pCO2, Ven: 54.8 mmHg — ABNORMAL HIGH (ref 45.0–50.0)
pH, Ven: 7.348 — ABNORMAL HIGH (ref 7.250–7.300)
pO2, Ven: 22 mmHg — ABNORMAL LOW (ref 31.0–45.0)
pO2, Ven: 25 mmHg — ABNORMAL LOW (ref 31.0–45.0)

## 2015-07-14 LAB — GLUCOSE, CAPILLARY
GLUCOSE-CAPILLARY: 474 mg/dL — AB (ref 65–99)
GLUCOSE-CAPILLARY: 406 mg/dL — AB (ref 65–99)
GLUCOSE-CAPILLARY: 307 mg/dL — AB (ref 65–99)
Glucose-Capillary: 235 mg/dL — ABNORMAL HIGH (ref 65–99)
Glucose-Capillary: 296 mg/dL — ABNORMAL HIGH (ref 65–99)
Glucose-Capillary: 315 mg/dL — ABNORMAL HIGH (ref 65–99)

## 2015-07-14 LAB — PROTIME-INR
INR: 1.14 (ref 0.00–1.49)
Prothrombin Time: 14.8 seconds (ref 11.6–15.2)

## 2015-07-14 LAB — RAPID URINE DRUG SCREEN, HOSP PERFORMED
Amphetamines: NOT DETECTED
BENZODIAZEPINES: POSITIVE — AB
Barbiturates: NOT DETECTED
Cocaine: POSITIVE — AB
OPIATES: NOT DETECTED
Tetrahydrocannabinol: NOT DETECTED

## 2015-07-14 LAB — CREATININE, SERUM
Creatinine, Ser: 1.02 mg/dL — ABNORMAL HIGH (ref 0.44–1.00)
GFR calc Af Amer: >60 mL/min (ref >60)
GFR calc non Af Amer: >60 mL/min (ref >60)

## 2015-07-14 LAB — MRSA PCR SCREENING: MRSA by PCR: NEGATIVE

## 2015-07-14 SURGERY — RIGHT/LEFT HEART CATH AND CORONARY ANGIOGRAPHY

## 2015-07-14 MED ORDER — ASPIRIN 81 MG PO CHEW: 81.0000 mg | CHEWABLE_TABLET | ORAL | Status: DC

## 2015-07-14 MED ORDER — MIDAZOLAM HCL 2 MG/2ML IJ SOLN
INTRAMUSCULAR | Status: AC
  Filled 2015-07-14: qty 2

## 2015-07-14 MED ORDER — MILRINONE LACTATE IN DEXTROSE 20-5 MG/100ML-% IV SOLN
0.3750 ug/kg/min | INTRAVENOUS | Status: DC
  Administered 2015-07-14 – 2015-07-15 (×2): 0.25 ug/kg/min via INTRAVENOUS
  Administered 2015-07-15 – 2015-07-16 (×2): 0.375 ug/kg/min via INTRAVENOUS
  Filled 2015-07-14 (×4): qty 100

## 2015-07-14 MED ORDER — ONDANSETRON HCL 4 MG/2ML IJ SOLN: 4.0000 mg | Freq: Four times a day (QID) | INTRAMUSCULAR | Status: DC | PRN

## 2015-07-14 MED ORDER — VENLAFAXINE HCL ER 150 MG PO CP24
150.0000 mg | ORAL_CAPSULE | Freq: Every day | ORAL | Status: DC
  Administered 2015-07-15 – 2015-07-23 (×9): 150 mg via ORAL
  Filled 2015-07-14 (×11): qty 1

## 2015-07-14 MED ORDER — INSULIN ASPART 100 UNIT/ML ~~LOC~~ SOLN
SUBCUTANEOUS | Status: AC
  Filled 2015-07-14: qty 1

## 2015-07-14 MED ORDER — FUROSEMIDE 10 MG/ML IJ SOLN
80.0000 mg | Freq: Two times a day (BID) | INTRAMUSCULAR | Status: DC
  Filled 2015-07-14: qty 8

## 2015-07-14 MED ORDER — GABAPENTIN 400 MG PO CAPS
800.0000 mg | ORAL_CAPSULE | Freq: Three times a day (TID) | ORAL | Status: DC
  Administered 2015-07-14 – 2015-07-23 (×27): 800 mg via ORAL
  Filled 2015-07-14 (×7): qty 2
  Filled 2015-07-14: qty 8
  Filled 2015-07-14 (×14): qty 2
  Filled 2015-07-14: qty 8
  Filled 2015-07-14 (×3): qty 2

## 2015-07-14 MED ORDER — ALBUTEROL SULFATE HFA 108 (90 BASE) MCG/ACT IN AERS: 2.0000 | INHALATION_SPRAY | Freq: Four times a day (QID) | RESPIRATORY_TRACT | Status: DC | PRN

## 2015-07-14 MED ORDER — DIPHENHYDRAMINE HCL 25 MG PO CAPS
25.0000 mg | ORAL_CAPSULE | Freq: Once | ORAL | Status: AC
  Administered 2015-07-15: 25 mg via ORAL
  Filled 2015-07-14: qty 1

## 2015-07-14 MED ORDER — SODIUM CHLORIDE 0.9% FLUSH
3.0000 mL | Freq: Two times a day (BID) | INTRAVENOUS | Status: DC
  Administered 2015-07-14: 3 mL via INTRAVENOUS
  Administered 2015-07-15: 10:00:00 via INTRAVENOUS

## 2015-07-14 MED ORDER — SODIUM CHLORIDE 0.9 % IV SOLN: 250.0000 mL | INTRAVENOUS | Status: DC | PRN

## 2015-07-14 MED ORDER — HEPARIN (PORCINE) IN NACL 2-0.9 UNIT/ML-% IJ SOLN
INTRAMUSCULAR | Status: DC | PRN
  Administered 2015-07-14: 1500 mL

## 2015-07-14 MED ORDER — FUROSEMIDE 10 MG/ML IJ SOLN
80.0000 mg | Freq: Two times a day (BID) | INTRAMUSCULAR | Status: DC
  Administered 2015-07-14 – 2015-07-17 (×6): 80 mg via INTRAVENOUS
  Filled 2015-07-14 (×5): qty 8

## 2015-07-14 MED ORDER — LETROZOLE 2.5 MG PO TABS
2.5000 mg | ORAL_TABLET | Freq: Every morning | ORAL | Status: DC
  Administered 2015-07-15 – 2015-07-23 (×10): 2.5 mg via ORAL
  Filled 2015-07-14 (×10): qty 1

## 2015-07-14 MED ORDER — NICOTINE 21 MG/24HR TD PT24
21.0000 mg | MEDICATED_PATCH | Freq: Every day | TRANSDERMAL | Status: DC
  Administered 2015-07-14 – 2015-07-23 (×10): 21 mg via TRANSDERMAL
  Filled 2015-07-14 (×10): qty 1

## 2015-07-14 MED ORDER — SODIUM CHLORIDE 0.9% FLUSH
3.0000 mL | Freq: Two times a day (BID) | INTRAVENOUS | Status: DC
  Administered 2015-07-15: 10:00:00 via INTRAVENOUS
  Administered 2015-07-16 – 2015-07-17 (×2): 3 mL via INTRAVENOUS

## 2015-07-14 MED ORDER — HEPARIN (PORCINE) IN NACL 2-0.9 UNIT/ML-% IJ SOLN
INTRAMUSCULAR | Status: AC
  Filled 2015-07-14: qty 1500

## 2015-07-14 MED ORDER — INSULIN ASPART 100 UNIT/ML ~~LOC~~ SOLN
0.0000 [IU] | Freq: Three times a day (TID) | SUBCUTANEOUS | Status: DC
  Administered 2015-07-14: 15 [IU] via SUBCUTANEOUS
  Administered 2015-07-14 – 2015-07-15 (×3): 11 [IU] via SUBCUTANEOUS
  Administered 2015-07-16 (×2): 5 [IU] via SUBCUTANEOUS
  Administered 2015-07-16: 11 [IU] via SUBCUTANEOUS
  Administered 2015-07-17 (×3): 8 [IU] via SUBCUTANEOUS
  Administered 2015-07-18 (×2): 5 [IU] via SUBCUTANEOUS
  Administered 2015-07-18: 8 [IU] via SUBCUTANEOUS
  Administered 2015-07-19: 11 [IU] via SUBCUTANEOUS
  Administered 2015-07-19: 5 [IU] via SUBCUTANEOUS
  Administered 2015-07-20: 3 [IU] via SUBCUTANEOUS
  Filled 2015-07-14: qty 0.15

## 2015-07-14 MED ORDER — DIPHENHYDRAMINE HCL 50 MG/ML IJ SOLN
INTRAMUSCULAR | Status: AC
  Administered 2015-07-14: 25 mg via INTRAVENOUS
  Filled 2015-07-14: qty 1

## 2015-07-14 MED ORDER — ACETAMINOPHEN 325 MG PO TABS
650.0000 mg | ORAL_TABLET | ORAL | Status: DC | PRN
  Administered 2015-07-19: 650 mg via ORAL
  Filled 2015-07-14: qty 2

## 2015-07-14 MED ORDER — FUROSEMIDE 10 MG/ML IJ SOLN
80.0000 mg | Freq: Two times a day (BID) | INTRAMUSCULAR | Status: DC
  Administered 2015-07-14: 80 mg via INTRAVENOUS

## 2015-07-14 MED ORDER — DIGOXIN 125 MCG PO TABS
0.1250 mg | ORAL_TABLET | Freq: Every day | ORAL | Status: DC
  Administered 2015-07-15 – 2015-07-23 (×10): 0.125 mg via ORAL
  Filled 2015-07-14 (×9): qty 1

## 2015-07-14 MED ORDER — SODIUM CHLORIDE 0.9 % IV SOLN
250.0000 mL | INTRAVENOUS | Status: DC | PRN
  Administered 2015-07-14: 250 mL via INTRAVENOUS

## 2015-07-14 MED ORDER — LIDOCAINE HCL (PF) 1 % IJ SOLN
INTRAMUSCULAR | Status: DC | PRN
  Administered 2015-07-14: 18 mL via SUBCUTANEOUS

## 2015-07-14 MED ORDER — ALBUTEROL SULFATE (2.5 MG/3ML) 0.083% IN NEBU: 2.5000 mg | INHALATION_SOLUTION | Freq: Four times a day (QID) | RESPIRATORY_TRACT | Status: DC | PRN

## 2015-07-14 MED ORDER — SODIUM CHLORIDE 0.9% FLUSH: 3.0000 mL | Freq: Two times a day (BID) | INTRAVENOUS | Status: DC

## 2015-07-14 MED ORDER — SODIUM CHLORIDE 0.9% FLUSH: 3.0000 mL | INTRAVENOUS | Status: DC | PRN

## 2015-07-14 MED ORDER — ATORVASTATIN CALCIUM 20 MG PO TABS
20.0000 mg | ORAL_TABLET | Freq: Every day | ORAL | Status: DC
  Administered 2015-07-14 – 2015-07-22 (×9): 20 mg via ORAL
  Filled 2015-07-14 (×9): qty 1

## 2015-07-14 MED ORDER — ENOXAPARIN SODIUM 40 MG/0.4ML ~~LOC~~ SOLN
40.0000 mg | SUBCUTANEOUS | Status: DC
  Administered 2015-07-16 – 2015-07-23 (×8): 40 mg via SUBCUTANEOUS
  Filled 2015-07-14 (×9): qty 0.4

## 2015-07-14 MED ORDER — LIDOCAINE HCL (PF) 1 % IJ SOLN
INTRAMUSCULAR | Status: AC
  Filled 2015-07-14: qty 30

## 2015-07-14 MED ORDER — ENOXAPARIN SODIUM 30 MG/0.3ML ~~LOC~~ SOLN: 30.0000 mg | Freq: Two times a day (BID) | SUBCUTANEOUS | Status: DC

## 2015-07-14 MED ORDER — FAMOTIDINE IN NACL 20-0.9 MG/50ML-% IV SOLN
20.0000 mg | Freq: Once | INTRAVENOUS | Status: AC
  Administered 2015-07-14: 20 mg via INTRAVENOUS

## 2015-07-14 MED ORDER — FENTANYL CITRATE (PF) 100 MCG/2ML IJ SOLN
INTRAMUSCULAR | Status: AC
  Filled 2015-07-14: qty 2

## 2015-07-14 MED ORDER — DIPHENHYDRAMINE HCL 50 MG/ML IJ SOLN
25.0000 mg | Freq: Once | INTRAMUSCULAR | Status: AC
  Administered 2015-07-14: 25 mg via INTRAVENOUS

## 2015-07-14 MED ORDER — SODIUM CHLORIDE 0.9 % IV SOLN
INTRAVENOUS | Status: DC
  Administered 2015-07-14: 07:00:00 via INTRAVENOUS

## 2015-07-14 MED ORDER — METHYLPREDNISOLONE SODIUM SUCC 125 MG IJ SOLR
125.0000 mg | Freq: Once | INTRAMUSCULAR | Status: AC
  Administered 2015-07-14: 125 mg via INTRAVENOUS

## 2015-07-14 MED ORDER — FAMOTIDINE IN NACL 20-0.9 MG/50ML-% IV SOLN
INTRAVENOUS | Status: AC
  Administered 2015-07-14: 20 mg via INTRAVENOUS
  Filled 2015-07-14: qty 50

## 2015-07-14 MED ORDER — POTASSIUM CHLORIDE CRYS ER 20 MEQ PO TBCR
20.0000 meq | EXTENDED_RELEASE_TABLET | Freq: Two times a day (BID) | ORAL | Status: DC
  Administered 2015-07-14: 20 meq via ORAL
  Filled 2015-07-14: qty 1

## 2015-07-14 MED ORDER — LORAZEPAM 1 MG PO TABS
1.0000 mg | ORAL_TABLET | Freq: Once | ORAL | Status: AC
  Administered 2015-07-14: 1 mg via ORAL
  Filled 2015-07-14: qty 1

## 2015-07-14 MED ORDER — ACETAMINOPHEN 325 MG PO TABS: 650.0000 mg | ORAL_TABLET | ORAL | Status: DC | PRN

## 2015-07-14 MED ORDER — ASPIRIN EC 81 MG PO TBEC
81.0000 mg | DELAYED_RELEASE_TABLET | Freq: Every day | ORAL | Status: DC
  Administered 2015-07-15 – 2015-07-23 (×11): 81 mg via ORAL
  Filled 2015-07-14 (×9): qty 1

## 2015-07-14 MED ORDER — SPIRONOLACTONE 25 MG PO TABS
25.0000 mg | ORAL_TABLET | Freq: Every day | ORAL | Status: DC
  Administered 2015-07-14 – 2015-07-23 (×11): 25 mg via ORAL
  Filled 2015-07-14 (×11): qty 1

## 2015-07-14 MED ORDER — FUROSEMIDE 10 MG/ML IJ SOLN
INTRAMUSCULAR | Status: AC
  Filled 2015-07-14: qty 8

## 2015-07-14 MED ORDER — IOPAMIDOL (ISOVUE-370) INJECTION 76%
INTRAVENOUS | Status: DC | PRN
  Administered 2015-07-14: 40 mL via INTRA_ARTERIAL

## 2015-07-14 MED ORDER — FENTANYL CITRATE (PF) 100 MCG/2ML IJ SOLN
INTRAMUSCULAR | Status: DC | PRN
  Administered 2015-07-14: 25 ug via INTRAVENOUS

## 2015-07-14 MED ORDER — MIDAZOLAM HCL 2 MG/2ML IJ SOLN
INTRAMUSCULAR | Status: DC | PRN
  Administered 2015-07-14: 1 mg via INTRAVENOUS

## 2015-07-14 MED ORDER — METHYLPREDNISOLONE SODIUM SUCC 125 MG IJ SOLR
INTRAMUSCULAR | Status: AC
  Administered 2015-07-14: 125 mg via INTRAVENOUS
  Filled 2015-07-14: qty 2

## 2015-07-14 SURGICAL SUPPLY — 11 items
CATH INFINITI 5 FR LCB (CATHETERS) ×1 IMPLANT
CATH INFINITI 5FR MULTPACK ANG (CATHETERS) ×2 IMPLANT
CATH SWAN GANZ 7F STRAIGHT (CATHETERS) ×1 IMPLANT
KIT HEART LEFT (KITS) ×2 IMPLANT
KIT HEART RIGHT NAMIC (KITS) ×2 IMPLANT
PACK CARDIAC CATHETERIZATION (CUSTOM PROCEDURE TRAY) ×2 IMPLANT
SHEATH PINNACLE 5F 10CM (SHEATH) ×1 IMPLANT
SHEATH PINNACLE 7F 10CM (SHEATH) ×1 IMPLANT
TRANSDUCER W/STOPCOCK (MISCELLANEOUS) ×2 IMPLANT
TUBING CIL FLEX 10 FLL-RA (TUBING) ×2 IMPLANT
WIRE EMERALD 3MM-J .035X150CM (WIRE) ×2 IMPLANT

## 2015-07-14 NOTE — Progress Notes (Addendum)
Site area: RFA/RFV Site Prior to Removal:  Level 0 Pressure Applied For:20 MIN Manual:   YES Patient Status During Pull: stable  Post Pull Site:  Level 0 Post Pull Instructions Given:  yes Post Pull Pulses Present: doppler Dressing Applied: pressure Bedrest begins @ 1015 till 1415 Comments:

## 2015-07-14 NOTE — Interval H&P Note (Signed)
History and Physical Interval Note:  07/14/2015 8:49 AM  Emily [name]  has presented today for surgery, with the diagnosis of hf  The various methods of treatment have been discussed with the patient and family. After consideration of risks, benefits and other options for treatment, the patient has consented to  Procedure(s): Right/Left Heart Cath and Coronary Angiography (N/A) and possible angioplasty as a surgical intervention .  The patient's history has been reviewed, patient examined, no change in status, stable for surgery.  I have reviewed the patient's chart and labs.  Questions were answered to the patient's satisfaction.     Harla Mensch, Quillian Quince
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
## 2015-07-14 NOTE — H&P (View-Only) (Signed)
ADVANCED HF CLINIC NOTE  Patient ID: Emily [name], female   DOB: 01-Oct-1960, 55 y.o.   MRN: [id number] Patient ID: Emily [name], female   DOB: Jan 13, 1961, 55 y.o.   MRN: [id number] PCP: Dr Kathlen Mody Primary Cardiologist: Dr. Haroldine Laws  Emily [name] is a 55 yo woman with a history of ER + R breast cancer s/p R lumpectomy, DM2, cholelithiasis s/p cholecystectomy (02/2013), COPD and systolic CHF due to nonischemic cardiomyopathy.  Patient was admitted in 09/2012 with acute pulmonary edema and cardiogenic shock.  LHC showed moderate nonobstructive CAD (40-50% mLAD, 50-70% LCx, 50% PDA).  She was started on milrinone and diuresed.  Cause of cardiomyopathy suspected to be Adriamycin toxicity.    ECHO 10/27/12 EF 20-25% ECHO 01/09/13 EF 30-35% ECHO 06/05/13 EF 45-50% septum dysynnergic  12/05/12 Carotid Dopplers- R 39 % stenosis and L  40-59% stenosis  Follow-up: She has moved to [place], [place] but returns for an re-evaluation of her HF. I have not seen her since 9/15. At last visit her heart function had returned to normal. 3 weeks ago developed orthopnea. Was admitted 2 weeks ago for 1 night due to ADHF. Diuresed 8 pounds. No echo done. Recently had surgery to remove R chest leiomyosarcoma. Margins not clear so needs larger resection. SOB with just mild exertion. Mild LE edema. + ab bloating. Still smoking. No dizziness. No CP.    Echo today EF 15-20% RV ok.   Lexiscan 09/23/13: EF 50% normal perfusion Holter: SR occasional PVCs  Labs (9/14): digoxin 0.4, K 3.6, creatinine 0.62 Labs (9/15): K 4.4 creatinine 0.68 Labs (10/26):    K 3.7 Cr 0.67 Labs: (03/20/2013): K+ 4.6, Cr 0.73 Labs (09/25/13): K 4.2 Cr 0.9  PMH: 1. COPD: PFTs (1/14) with FEV1 68%.  Quit smoking 9/14.  2. Type II diabetes  3. HTN 4. Breast cancer: 2009, s/p Adriamycin/Cytoxan/Taxol chemo, lumpectomy and radiation.   5. Nonischemic cardiomyopathy: Possibly due to Adriamycin.  Echo (8/14) with EF 20-25%, global hypokinesis, moderate  diastolic dysfunction, normal RV size and with mild to moderately decreased systolic function, moderate TR.   6. CAD: LHC (8/14) with nonobstructive CAD; 40-50% mLAD, 50-70% LCx, 50% PDA.  7. Hyperlipidemia  SH: Lives alone in [place].  Mother is next door.  Still smoking.   FH: No premature CAD.  No family history of cardiomyopathy.   ROS: All systems reviewed and negative except as per HPI.   Current Outpatient Prescriptions  Medication Sig Dispense Refill  . albuterol (PROVENTIL HFA;VENTOLIN HFA) 108 (90 BASE) MCG/ACT inhaler Inhale 2 puffs into the lungs every 6 (six) hours as needed for wheezing or shortness of breath. 1 Inhaler 0  . aspirin EC 81 MG tablet Take 1 tablet (81 mg total) by mouth daily. 90 tablet 3  . atorvastatin (LIPITOR) 20 MG tablet Take 1 tablet (20 mg total) by mouth daily. 90 tablet 3  . carvedilol (COREG) 6.25 MG tablet Take 1 tablet (6.25 mg total) by mouth 2 (two) times daily with a meal. 180 tablet 3  . Cholecalciferol (VITAMIN D) 2000 UNITS CAPS Take 1 capsule by mouth daily.    . enalapril (VASOTEC) 10 MG tablet Take 0.5 tablets (5 mg total) by mouth 2 (two) times daily. 180 tablet 3  . furosemide (LASIX) 40 MG tablet Take 40 mg by mouth 2 (two) times daily.    Marland Kitchen gabapentin (NEURONTIN) 400 MG capsule Take 2 capsules (800 mg total) by mouth 3 (three) times daily. 180 capsule 2  .  glucose blood test strip Test bid. 180 each 3  . ibuprofen (ADVIL,MOTRIN) 200 MG tablet Take 200 mg by mouth every 6 (six) hours as needed for mild pain.    Marland Kitchen letrozole (FEMARA) 2.5 MG tablet Take 1 tablet (2.5 mg total) by mouth every morning. 90 tablet 2  . loperamide (IMODIUM) 2 MG capsule Take 1 capsule (2 mg total) by mouth daily as needed for diarrhea or loose stools. 90 capsule 0  . LORazepam (ATIVAN) 2 MG tablet Take 1 tablet (2 mg total) by mouth at bedtime. 90 tablet 1  . metFORMIN (GLUCOPHAGE) 1000 MG tablet Take 1 tablet (1,000 mg total) by mouth 2 (two) times daily with  a meal. 180 tablet 1  . venlafaxine XR (EFFEXOR-XR) 150 MG 24 hr capsule Take 150 mg by mouth daily with breakfast.     No current facility-administered medications for this encounter.    Filed Vitals:   07/08/15 1423  BP: 126/70  Pulse: 91  Weight: 154 lb 8 oz (70.081 kg)  SpO2: 98%    General: NAD Neck: JVP flat, no thyromegaly or thyroid nodule.  Lungs: Clear to auscultation bilaterally with normal respiratory effort. Decreased BS throughout.  CV: Laterally displaced PMI.  Heart regular S1/S2, no S3/S4, no murmur.  1+ edema.  No carotid bruit.  Normal pedal pulses Abdomen: Soft, nontender, no hepatosplenomegaly, no distention. R scar Skin: Intact without lesions or rashes.  Neurologic: Alert and oriented x 3.  Psych: Normal affect. Extremities: No clubbing or cyanosis. DPs 1+ bilateral  HEENT: Normal.   Assessment/Plan: 1. Chronic systolic CHF: NICM, likely Adriamycin toxicity,. EF had previously recovered but now way back down about 20% - Tenuous. NYHA III. Mild volume overload. - Start Digoxin 0.125mg  daily. - Stop Enalapril. 36 hour washout. Then start Entresto 24/26 twice daily.  - Continue coreg 6.25 mg BID, lasix 40 bid and spiro for now. - Given h/o CAD and ongoing tobacco use falling EF  will need repeat R/L heart cath 2. CAD: Moderate nonobstructive disease previously. Will repeat cath. 3. COPD: Still smoking. I have encouraged her to stop once again 4. Current smoker- Encouraged to stop smoking she declines smoking cessation.  5. Breast Cancer: received adriamycin. Cancer free for > 5 years. 6. Carotid stenosis: asymptomatic. Will get yearly u/s. Continue asa and statin.   Emily Schetter,MD 3:11 PM

## 2015-07-14 NOTE — Progress Notes (Signed)
Pt refuses foley. Up to BR to void .

## 2015-07-15 ENCOUNTER — Encounter (HOSPITAL_COMMUNITY): Payer: Self-pay | Admitting: Internal Medicine

## 2015-07-15 DIAGNOSIS — Z7189 Other specified counseling: Secondary | ICD-10-CM

## 2015-07-15 DIAGNOSIS — Z515 Encounter for palliative care: Secondary | ICD-10-CM

## 2015-07-15 LAB — BASIC METABOLIC PANEL
ANION GAP: 12 (ref 5–15)
BUN: 21 mg/dL — AB (ref 6–20)
CALCIUM: 8.9 mg/dL (ref 8.9–10.3)
CO2: 30 mmol/L (ref 22–32)
Chloride: 94 mmol/L — ABNORMAL LOW (ref 101–111)
Creatinine, Ser: 0.97 mg/dL (ref 0.44–1.00)
GFR calc Af Amer: >60 mL/min (ref >60)
Glucose, Bld: 261 mg/dL — ABNORMAL HIGH (ref 65–99)
POTASSIUM: 3.1 mmol/L — AB (ref 3.5–5.1)
SODIUM: 136 mmol/L (ref 135–145)

## 2015-07-15 LAB — CARBOXYHEMOGLOBIN
CARBOXYHEMOGLOBIN: 1.3 % (ref 0.5–1.5)
CARBOXYHEMOGLOBIN: 1.4 % (ref 0.5–1.5)
METHEMOGLOBIN: 0.6 % (ref 0.0–1.5)
Methemoglobin: 0.9 % (ref 0.0–1.5)
O2 SAT: 43.7 %
O2 SAT: 55.4 %
TOTAL HEMOGLOBIN: 11.7 g/dL — AB (ref 12.0–16.0)
Total hemoglobin: 10.6 g/dL — ABNORMAL LOW (ref 12.0–16.0)

## 2015-07-15 LAB — CBC
HEMATOCRIT: 35.4 % — AB (ref 36.0–46.0)
HEMOGLOBIN: 11.3 g/dL — AB (ref 12.0–15.0)
MCH: 28.5 pg (ref 26.0–34.0)
MCHC: 31.9 g/dL (ref 30.0–36.0)
MCV: 89.4 fL (ref 78.0–100.0)
Platelets: 203 10*3/uL (ref 150–400)
RBC: 3.96 MIL/uL (ref 3.87–5.11)
RDW: 14 % (ref 11.5–15.5)
WBC: 12.1 10*3/uL — AB (ref 4.0–10.5)

## 2015-07-15 LAB — GLUCOSE, CAPILLARY
GLUCOSE-CAPILLARY: 323 mg/dL — AB (ref 65–99)
Glucose-Capillary: 251 mg/dL — ABNORMAL HIGH (ref 65–99)
Glucose-Capillary: 302 mg/dL — ABNORMAL HIGH (ref 65–99)
Glucose-Capillary: 306 mg/dL — ABNORMAL HIGH (ref 65–99)

## 2015-07-15 MED ORDER — POTASSIUM CHLORIDE CRYS ER 20 MEQ PO TBCR
40.0000 meq | EXTENDED_RELEASE_TABLET | Freq: Two times a day (BID) | ORAL | Status: DC
  Administered 2015-07-15 – 2015-07-17 (×4): 40 meq via ORAL
  Filled 2015-07-15 (×4): qty 2

## 2015-07-15 MED ORDER — LORAZEPAM 1 MG PO TABS
2.0000 mg | ORAL_TABLET | Freq: Every day | ORAL | Status: DC
  Administered 2015-07-15 – 2015-07-22 (×8): 2 mg via ORAL
  Filled 2015-07-15 (×8): qty 2

## 2015-07-15 MED ORDER — SODIUM CHLORIDE 0.9% FLUSH
10.0000 mL | INTRAVENOUS | Status: DC | PRN
Start: 2015-07-15 — End: 2015-07-23

## 2015-07-15 MED ORDER — POTASSIUM CHLORIDE CRYS ER 20 MEQ PO TBCR
40.0000 meq | EXTENDED_RELEASE_TABLET | Freq: Once | ORAL | Status: AC
  Administered 2015-07-15: 40 meq via ORAL
  Filled 2015-07-15: qty 2

## 2015-07-15 MED ORDER — MAGNESIUM HYDROXIDE 400 MG/5ML PO SUSP
30.0000 mL | Freq: Every day | ORAL | Status: DC | PRN
  Administered 2015-07-15: 30 mL via ORAL
  Filled 2015-07-15: qty 30

## 2015-07-15 MED ORDER — LORAZEPAM 1 MG PO TABS: 1.0000 mg | ORAL_TABLET | Freq: Every day | ORAL | Status: DC

## 2015-07-15 MED ORDER — HYDROXYZINE HCL 25 MG PO TABS
25.0000 mg | ORAL_TABLET | Freq: Two times a day (BID) | ORAL | Status: DC | PRN
  Administered 2015-07-15: 25 mg via ORAL
  Filled 2015-07-15: qty 1

## 2015-07-15 MED ORDER — SODIUM CHLORIDE 0.9% FLUSH
10.0000 mL | Freq: Two times a day (BID) | INTRAVENOUS | Status: DC
  Administered 2015-07-15: 10 mL
  Administered 2015-07-15 – 2015-07-16 (×2): 20 mL
  Administered 2015-07-17 – 2015-07-22 (×8): 10 mL
  Administered 2015-07-23: 20 mL

## 2015-07-15 NOTE — Progress Notes (Signed)
CRITICAL VALUE ALERT  Critical value received:  Potassium 3.1  Date of notification:  07/15/15  Time of notification:  Was not notified  Nurse who received alert:  Alben Deeds, RN  MD notified (1st page):  Corky Downs  Responding MD:  Corky Downs -- pt receiving 8meq BID -- extra 40 meq ordered PO. Will continue to monitor.
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
## 2015-07-15 NOTE — Telephone Encounter (Signed)
Encounter open in error 

## 2015-07-15 NOTE — Progress Notes (Signed)
Peripherally Inserted Central Catheter/Midline Placement  The IV Nurse has discussed with the patient and/or persons authorized to consent for the patient, the purpose of this procedure and the potential benefits and risks involved with this procedure.  The benefits include less needle sticks, lab draws from the catheter and patient may be discharged home with the catheter.  Risks include, but not limited to, infection, bleeding, blood clot (thrombus formation), and puncture of an artery; nerve damage and irregular heat beat.  Alternatives to this procedure were also discussed.  Consent obtained by Claretha Cooper, RN  PICC/Midline Placement Documentation        Emily [name], Emily [name] 07/15/2015, 11:34 AM

## 2015-07-15 NOTE — Telephone Encounter (Signed)
encounter open in error

## 2015-07-15 NOTE — Progress Notes (Signed)
Inpatient Diabetes Program Recommendations  AACE/ADA: New Consensus Statement on Inpatient Glycemic Control (2015)  Target Ranges:  Prepandial:   less than 140 mg/dL      Peak postprandial:   less than 180 mg/dL (1-2 hours)      Critically ill patients:  140 - 180 mg/dL  Results for Emily [name], Emily [name] (MRN [id number]) as of 07/15/2015 10:37  Ref. Range 07/14/2015 13:08 07/14/2015 15:35 07/14/2015 17:57 07/14/2015 21:05 07/15/2015 07:51  Glucose-Capillary Latest Ref Range: 65-99 mg/dL 474 (H) 406 (H) 315 (H) 307 (H) 251 (H)   Review of Glycemic Control  Diabetes history: DM 2 Outpatient Diabetes medications: Metformin 1 gm bid Current orders for Inpatient glycemic control: Novolog correction 0-15 units tid  Inpatient Diabetes Program Recommendations:  Noted elevated CBGs. Please consider starting basal insulin Lantus 15 units daily (approx. 0.2 units/kg) to assist with glycemic control.  Thank you, Nani Gasser. Brittley Regner, RN, MSN, CDE Inpatient Glycemic Control Team Team Pager [phone] (8am-5pm) 07/15/2015 10:44 AM

## 2015-07-15 NOTE — Progress Notes (Signed)
Advanced Heart Failure Rounding Note   Subjective:     Admitted from cath lab with cardiogenic shock. Started on milrinone and diuresing with IV lasix.   + Cocaine on admit. Has been using over the last few weeks.    Objective:   Weight Range:  Vital Signs:   Temp:  [97.7 F (36.5 C)-98.4 F (36.9 C)] 98.2 F (36.8 C) (05/18 0754) Pulse Rate:  [101-113] 106 (05/18 0300) Resp:  [9-34] 18 (05/18 0754) BP: (88-129)/(44-102) 90/58 mmHg (05/18 0754) SpO2:  [89 %-99 %] 94 % (05/18 0754) Weight:  [153 lb 14.1 oz (69.8 kg)-157 lb 6.5 oz (71.4 kg)] 156 lb 4.8 oz (70.897 kg) (05/18 0500)    Weight change: Filed Weights   07/14/15 1800 07/15/15 0300 07/15/15 0500  Weight: 157 lb 6.5 oz (71.4 kg) 153 lb 14.1 oz (69.8 kg) 156 lb 4.8 oz (70.897 kg)    Intake/Output:   Intake/Output Summary (Last 24 hours) at 07/15/15 0847 Last data filed at 07/15/15 0800  Gross per 24 hour  Intake 964.75 ml  Output   1550 ml  Net -585.25 ml     Physical Exam: General:  Well appearing. No resp difficulty HEENT: normal Neck: supple. JVP to jaw  . Carotids 2+ bilat; no bruits. No lymphadenopathy or thryomegaly appreciated. Cor: PMI nondisplaced. Regular rate & rhythm. No rubs, gallops or murmurs. Lungs: clear Abdomen: soft, nontender, nondistended. No hepatosplenomegaly. No bruits or masses. Good bowel sounds. Extremities: no cyanosis, clubbing, rash, edema Neuro: alert & orientedx3, cranial nerves grossly intact. moves all 4 extremities w/o difficulty. Affect pleasant  Telemetry: Sinus Tach 100s   Labs: Basic Metabolic Panel:  Recent Labs Lab 07/08/15 1545 07/14/15 1818 07/15/15 0347  NA 139  --  136  K 3.7  --  3.1*  CL 94*  --  94*  CO2 25  --  30  GLUCOSE 145*  --  261*  BUN 12  --  21*  CREATININE 1.14* 1.02* 0.97  CALCIUM 8.9  --  8.9    Liver Function Tests:  Recent Labs Lab 07/08/15 1545  AST 26  ALT 19  ALKPHOS 46  BILITOT 0.7  PROT 7.0  ALBUMIN 3.9    No results for input(s): LIPASE, AMYLASE in the last 168 hours. No results for input(s): AMMONIA in the last 168 hours.  CBC:  Recent Labs Lab 07/08/15 1545 07/14/15 1818 07/15/15 0347  WBC 11.9* 12.5* 12.1*  HGB 11.8* 12.3 11.3*  HCT 36.5 37.5 35.4*  MCV 89.5 89.7 89.4  PLT 238 232 203    Cardiac Enzymes: No results for input(s): CKTOTAL, CKMB, CKMBINDEX, TROPONINI in the last 168 hours.  BNP: BNP (last 3 results)  Recent Labs  07/08/15 1545  BNP 331.2*    ProBNP (last 3 results) No results for input(s): PROBNP in the last 8760 hours.    Other results:  Imaging:  No results found.   Medications:     Scheduled Medications: . aspirin EC  81 mg Oral Daily  . atorvastatin  20 mg Oral q1800  . digoxin  0.125 mg Oral Daily  . enoxaparin (LOVENOX) injection  40 mg Subcutaneous Q24H  . furosemide  80 mg Intravenous Q12H  . gabapentin  800 mg Oral TID  . insulin aspart  0-15 Units Subcutaneous TID WC  . letrozole  2.5 mg Oral q morning - 10a  . nicotine  21 mg Transdermal Daily  . potassium chloride  20 mEq Oral BID  .  sodium chloride flush  3 mL Intravenous Q12H  . sodium chloride flush  3 mL Intravenous Q12H  . spironolactone  25 mg Oral Daily  . venlafaxine XR  150 mg Oral Q breakfast     Infusions: . milrinone 0.25 mcg/kg/min (07/15/15 0800)     PRN Medications:  sodium chloride, sodium chloride, acetaminophen, albuterol, ondansetron (ZOFRAN) IV, sodium chloride flush, sodium chloride flush   Assessment:  1.  Cardiogenic shock 2. Acute on chronic systolic HF due to NICM EF 20% 3. Non-obstructive CAD 4. COPD with ongoing tobacco use 5. H/o breast CA treated with Adriamycin 6. Recent resection of leiomyosarcoma in R chest (margins +) 7. Carotid stenosis 8. DM2 9. Substance Abuse- UDS + cocaine  10. Hypokalemia    Plan/Discussion:   For PICC today. Continue IV lasix + milrinone.No BB with shock. I am very concerned about home  milrinone with current cocaine abuse. Hopefully we can wean off milrinone.  No bb. No ace with hypotension.   Has had long history of drug abuse. + UDS cocaine. Consult SW for substance abuse. She is considering rehab.   I will also ask palliative care to consult for goals of care.   Length of Stay: 1   Amy Clegg NP-C  07/15/2015, 8:47 AM  Advanced Heart Failure Team Pager [phone] (M-F; 7a - 4p)  Please contact [facility] for night-coverage after hours (4p -7a ) and weekends on [email or web address]  Patient seen and examined with Darrick Grinder, NP. We discussed all aspects of the encounter. I agree with the assessment and plan as stated above.   She remains quite tenuous. Co-ox remains low despite milrinone. Will increase milrinone to 0.375. Continue IV diuresis. Renal function and electrolytes ok. No b-blocker or ACE/ARB yet. Suspect weaning inotropes will be quite difficult.    Long talk about substance abuse.  Also discussed with Palliative Care.   Elyssa Pendelton,MD 11:21 PM

## 2015-07-16 DIAGNOSIS — Z7189 Other specified counseling: Secondary | ICD-10-CM | POA: Insufficient documentation

## 2015-07-16 DIAGNOSIS — E876 Hypokalemia: Secondary | ICD-10-CM

## 2015-07-16 DIAGNOSIS — Z515 Encounter for palliative care: Secondary | ICD-10-CM | POA: Insufficient documentation

## 2015-07-16 LAB — CARBOXYHEMOGLOBIN
Carboxyhemoglobin: 0.8 % (ref 0.5–1.5)
Carboxyhemoglobin: 1.5 % (ref 0.5–1.5)
METHEMOGLOBIN: 0.8 % (ref 0.0–1.5)
Methemoglobin: 0.7 % (ref 0.0–1.5)
O2 Saturation: 54 %
O2 Saturation: 56.1 %
Total hemoglobin: 14.8 g/dL (ref 12.0–16.0)
Total hemoglobin: 10.8 g/dL — ABNORMAL LOW (ref 12.0–16.0)

## 2015-07-16 LAB — BASIC METABOLIC PANEL
ANION GAP: 11 (ref 5–15)
BUN: 18 mg/dL (ref 6–20)
CALCIUM: 8.5 mg/dL — AB (ref 8.9–10.3)
CO2: 32 mmol/L (ref 22–32)
Chloride: 95 mmol/L — ABNORMAL LOW (ref 101–111)
Creatinine, Ser: 0.79 mg/dL (ref 0.44–1.00)
GLUCOSE: 258 mg/dL — AB (ref 65–99)
Potassium: 4 mmol/L (ref 3.5–5.1)
SODIUM: 138 mmol/L (ref 135–145)

## 2015-07-16 LAB — GLUCOSE, CAPILLARY
GLUCOSE-CAPILLARY: 411 mg/dL — AB (ref 65–99)
Glucose-Capillary: 244 mg/dL — ABNORMAL HIGH (ref 65–99)
Glucose-Capillary: 332 mg/dL — ABNORMAL HIGH (ref 65–99)
Glucose-Capillary: 221 mg/dL — ABNORMAL HIGH (ref 65–99)
Glucose-Capillary: 234 mg/dL — ABNORMAL HIGH (ref 65–99)

## 2015-07-16 LAB — MAGNESIUM: Magnesium: 1.9 mg/dL (ref 1.7–2.4)

## 2015-07-16 MED ORDER — MILRINONE LACTATE IN DEXTROSE 20-5 MG/100ML-% IV SOLN
0.1250 ug/kg/min | INTRAVENOUS | Status: DC
  Administered 2015-07-16 – 2015-07-18 (×6): 0.5 ug/kg/min via INTRAVENOUS
  Administered 2015-07-18: 0.375 ug/kg/min via INTRAVENOUS
  Administered 2015-07-19 – 2015-07-20 (×2): 0.25 ug/kg/min via INTRAVENOUS
  Administered 2015-07-21: 0.125 ug/kg/min via INTRAVENOUS
  Filled 2015-07-16 (×10): qty 100

## 2015-07-16 NOTE — Progress Notes (Signed)
Advanced Heart Failure Rounding Note   Subjective:     Admitted from cath lab with cardiogenic shock. Started on milrinone and diuresing with IV lasix.   Milrinone increased to 0.375 mcg once CO-OX obtained. Todays CO-OX 54%.  + Cocaine on admit. Has been using over the last few weeks.    Objective:   Weight Range:  Vital Signs:   Temp:  [97.5 F (36.4 C)-98.7 F (37.1 C)] 97.5 F (36.4 C) (05/19 0334) Pulse Rate:  [110-111] 111 (05/18 2344) Resp:  [18] 18 (05/18 2002) BP: (105-132)/(70-97) 114/70 mmHg (05/19 0312) SpO2:  [95 %-98 %] 96 % (05/19 0312) Weight:  [159 lb 1.6 oz (72.167 kg)] 159 lb 1.6 oz (72.167 kg) (05/19 0334)    Weight change: Filed Weights   07/15/15 0300 07/15/15 0500 07/16/15 0334  Weight: 153 lb 14.1 oz (69.8 kg) 156 lb 4.8 oz (70.897 kg) 159 lb 1.6 oz (72.167 kg)    Intake/Output:   Intake/Output Summary (Last 24 hours) at 07/16/15 0848 Last data filed at 07/16/15 0700  Gross per 24 hour  Intake 1888.4 ml  Output      0 ml  Net 1888.4 ml     Physical Exam: CVP 10  General: No resp difficulty. Sitting in the chair.  HEENT: normal Neck: supple. JVP 9-10   . Carotids 2+ bilat; no bruits. No lymphadenopathy or thryomegaly appreciated. Cor: PMI nondisplaced. Tachy Regular +s3 Lungs: clear Abdomen: soft, nontender, nondistended. No hepatosplenomegaly. No bruits or masses. Good bowel sounds. Extremities: no cyanosis, clubbing, rash, edema Neuro: alert & orientedx3, cranial nerves grossly intact. moves all 4 extremities w/o difficulty. Affect pleasant  Telemetry: Sinus Tach 100s   Labs: Basic Metabolic Panel:  Recent Labs Lab 07/14/15 1818 07/15/15 0347  NA  --  136  K  --  3.1*  CL  --  94*  CO2  --  30  GLUCOSE  --  261*  BUN  --  21*  CREATININE 1.02* 0.97  CALCIUM  --  8.9    Liver Function Tests: No results for input(s): AST, ALT, ALKPHOS, BILITOT, PROT, ALBUMIN in the last 168 hours. No results for input(s): LIPASE,  AMYLASE in the last 168 hours. No results for input(s): AMMONIA in the last 168 hours.  CBC:  Recent Labs Lab 07/14/15 1818 07/15/15 0347  WBC 12.5* 12.1*  HGB 12.3 11.3*  HCT 37.5 35.4*  MCV 89.7 89.4  PLT 232 203    Cardiac Enzymes: No results for input(s): CKTOTAL, CKMB, CKMBINDEX, TROPONINI in the last 168 hours.  BNP: BNP (last 3 results)  Recent Labs  07/08/15 1545  BNP 331.2*    ProBNP (last 3 results) No results for input(s): PROBNP in the last 8760 hours.    Other results:  Imaging: No results found.   Medications:     Scheduled Medications: . aspirin EC  81 mg Oral Daily  . atorvastatin  20 mg Oral q1800  . digoxin  0.125 mg Oral Daily  . enoxaparin (LOVENOX) injection  40 mg Subcutaneous Q24H  . furosemide  80 mg Intravenous Q12H  . gabapentin  800 mg Oral TID  . insulin aspart  0-15 Units Subcutaneous TID WC  . letrozole  2.5 mg Oral q morning - 10a  . LORazepam  2 mg Oral QHS  . nicotine  21 mg Transdermal Daily  . potassium chloride  40 mEq Oral BID  . sodium chloride flush  10-40 mL Intracatheter Q12H  . sodium  chloride flush  3 mL Intravenous Q12H  . spironolactone  25 mg Oral Daily  . venlafaxine XR  150 mg Oral Q breakfast    Infusions: . milrinone 0.375 mcg/kg/min (07/16/15 0419)    PRN Medications: sodium chloride, acetaminophen, albuterol, hydrOXYzine, magnesium hydroxide, ondansetron (ZOFRAN) IV, sodium chloride flush, sodium chloride flush   Assessment:  1.  Cardiogenic shock 2. Acute on chronic systolic HF due to NICM EF 20% 3. Non-obstructive CAD 4. COPD with ongoing tobacco use 5. H/o breast CA treated with Adriamycin 6. Recent resection of leiomyosarcoma in R chest (margins +) 7. Carotid stenosis 8. DM2 9. Substance Abuse- UDS + cocaine  10. Hypokalemia    Plan/Discussion:   Todays CO-OX improved 54%. Increase milrinone 0.5 mcg. Continue IV lasix + milrinone.No BB with shock. BMET pending.    Hopefully we  can wean off milrinone.  No bb. No ace with hypotension.   Has had long history of drug abuse. + UDS cocaine. Consult SW for substance abuse. She is considering rehab.   Palliative care consult appreciated.    Length of Stay: 2   Amy Clegg NP-C  07/16/2015, 8:48 AM  Advanced Heart Failure Team Pager [phone] (M-F; 7a - 4p)  Please contact [facility] for night-coverage after hours (4p -7a ) and weekends on [email or web address]  Patient seen and examined with Darrick Grinder, NP. We discussed all aspects of the encounter. I agree with the assessment and plan as stated above.   She remains quite tenuous. CVP still up. Co-ox borderline. Agree with increasing milrinone to 0.5. Will need to try and wean soon. Continue IV diuresis for 1 more day. Renal function stable. Supp K+ aggressively.   Bensimhon, Daniel,MD 4:34 PM

## 2015-07-17 LAB — GLUCOSE, CAPILLARY
GLUCOSE-CAPILLARY: 242 mg/dL — AB (ref 65–99)
Glucose-Capillary: 271 mg/dL — ABNORMAL HIGH (ref 65–99)
Glucose-Capillary: 252 mg/dL — ABNORMAL HIGH (ref 65–99)
Glucose-Capillary: 190 mg/dL — ABNORMAL HIGH (ref 65–99)

## 2015-07-17 LAB — CARBOXYHEMOGLOBIN
Carboxyhemoglobin: 1.6 % — ABNORMAL HIGH (ref 0.5–1.5)
Methemoglobin: 0.7 % (ref 0.0–1.5)
O2 Saturation: 53.2 %
Total hemoglobin: 11.8 g/dL — ABNORMAL LOW (ref 12.0–16.0)

## 2015-07-17 LAB — BASIC METABOLIC PANEL
Anion gap: 10 (ref 5–15)
BUN: 22 mg/dL — AB (ref 6–20)
CALCIUM: 8.6 mg/dL — AB (ref 8.9–10.3)
CHLORIDE: 95 mmol/L — AB (ref 101–111)
CO2: 30 mmol/L (ref 22–32)
CREATININE: 0.73 mg/dL (ref 0.44–1.00)
GLUCOSE: 257 mg/dL — AB (ref 65–99)
POTASSIUM: 3.7 mmol/L (ref 3.5–5.1)
SODIUM: 135 mmol/L (ref 135–145)

## 2015-07-17 MED ORDER — FUROSEMIDE 80 MG PO TABS
80.0000 mg | ORAL_TABLET | Freq: Two times a day (BID) | ORAL | Status: DC
  Administered 2015-07-17 – 2015-07-18 (×2): 80 mg via ORAL
  Filled 2015-07-17 (×2): qty 1

## 2015-07-17 MED ORDER — LOSARTAN POTASSIUM 25 MG PO TABS
25.0000 mg | ORAL_TABLET | Freq: Every day | ORAL | Status: DC
  Administered 2015-07-17 – 2015-07-18 (×2): 25 mg via ORAL
  Filled 2015-07-17 (×2): qty 1

## 2015-07-17 MED ORDER — GLIPIZIDE 5 MG PO TABS
5.0000 mg | ORAL_TABLET | Freq: Every day | ORAL | Status: DC
  Administered 2015-07-18: 5 mg via ORAL
  Filled 2015-07-17: qty 1

## 2015-07-17 MED ORDER — POTASSIUM CHLORIDE CRYS ER 20 MEQ PO TBCR
20.0000 meq | EXTENDED_RELEASE_TABLET | Freq: Two times a day (BID) | ORAL | Status: DC
  Administered 2015-07-17: 20 meq via ORAL
  Filled 2015-07-17: qty 1

## 2015-07-17 NOTE — Progress Notes (Addendum)
Advanced Heart Failure Rounding Note   Subjective:     Admitted with cardiogenic shock. EF 15%. RV down.   Milrinone increased to 0.5 mcg yesterday. Co-ox still low at 53%. Diuresing well though. CVP 8. Weight down 5 pounds. Feels ok. No dyspnea.CBGs high   + Cocaine on admit. Has been using over the last few weeks.    Objective:   Weight Range:  Vital Signs:   Temp:  [97.2 F (36.2 C)-98.5 F (36.9 C)] 97.2 F (36.2 C) (05/20 1105) Pulse Rate:  [109-119] 113 (05/20 1105) Resp:  [15-17] 17 (05/20 1105) BP: (113-147)/(71-85) 130/74 mmHg (05/20 1105) SpO2:  [95 %-96 %] 96 % (05/20 1105) Weight:  [70.1 kg (154 lb 8.7 oz)] 70.1 kg (154 lb 8.7 oz) (05/20 0253) Last BM Date:  (PTA)  Weight change: Filed Weights   07/15/15 0500 07/16/15 0334 07/17/15 0253  Weight: 70.897 kg (156 lb 4.8 oz) 72.167 kg (159 lb 1.6 oz) 70.1 kg (154 lb 8.7 oz)    Intake/Output:   Intake/Output Summary (Last 24 hours) at 07/17/15 1131 Last data filed at 07/17/15 1100  Gross per 24 hour  Intake 1189.2 ml  Output   3000 ml  Net -1810.8 ml     Physical Exam: CVP 8 General: No resp difficulty. Sitting in bed HEENT: normal Neck: supple. JVP 8  . Carotids 2+ bilat; no bruits. No lymphadenopathy or thryomegaly appreciated. Cor: PMI nondisplaced. Tachy Regular +s3 Lungs: clear Abdomen: soft, nontender, nondistended. No hepatosplenomegaly. No bruits or masses. Good bowel sounds. Extremities: no cyanosis, clubbing, rash, edema Neuro: alert & orientedx3, cranial nerves grossly intact. moves all 4 extremities w/o difficulty. Affect pleasant  Telemetry: Sinus Tach 100-110s   Labs: Basic Metabolic Panel:  Recent Labs Lab 07/14/15 1818 07/15/15 0347 07/16/15 0901 07/17/15 0400  NA  --  136 138 135  K  --  3.1* 4.0 3.7  CL  --  94* 95* 95*  CO2  --  30 32 30  GLUCOSE  --  261* 258* 257*  BUN  --  21* 18 22*  CREATININE 1.02* 0.97 0.79 0.73  CALCIUM  --  8.9 8.5* 8.6*  MG  --   --   1.9  --     Liver Function Tests: No results for input(s): AST, ALT, ALKPHOS, BILITOT, PROT, ALBUMIN in the last 168 hours. No results for input(s): LIPASE, AMYLASE in the last 168 hours. No results for input(s): AMMONIA in the last 168 hours.  CBC:  Recent Labs Lab 07/14/15 1818 07/15/15 0347  WBC 12.5* 12.1*  HGB 12.3 11.3*  HCT 37.5 35.4*  MCV 89.7 89.4  PLT 232 203    Cardiac Enzymes: No results for input(s): CKTOTAL, CKMB, CKMBINDEX, TROPONINI in the last 168 hours.  BNP: BNP (last 3 results)  Recent Labs  07/08/15 1545  BNP 331.2*    ProBNP (last 3 results) No results for input(s): PROBNP in the last 8760 hours.    Other results:  Imaging: No results found.   Medications:     Scheduled Medications: . aspirin EC  81 mg Oral Daily  . atorvastatin  20 mg Oral q1800  . digoxin  0.125 mg Oral Daily  . enoxaparin (LOVENOX) injection  40 mg Subcutaneous Q24H  . furosemide  80 mg Intravenous Q12H  . gabapentin  800 mg Oral TID  . insulin aspart  0-15 Units Subcutaneous TID WC  . letrozole  2.5 mg Oral q morning - 10a  .  LORazepam  2 mg Oral QHS  . nicotine  21 mg Transdermal Daily  . potassium chloride  40 mEq Oral BID  . sodium chloride flush  10-40 mL Intracatheter Q12H  . sodium chloride flush  3 mL Intravenous Q12H  . spironolactone  25 mg Oral Daily  . venlafaxine XR  150 mg Oral Q breakfast    Infusions: . milrinone 0.5 mcg/kg/min (07/17/15 0800)    PRN Medications: sodium chloride, acetaminophen, albuterol, hydrOXYzine, magnesium hydroxide, ondansetron (ZOFRAN) IV, sodium chloride flush, sodium chloride flush   Assessment:   1.  Cardiogenic shock 2. Acute on chronic systolic HF due to NICM EF 20% 3. Non-obstructive CAD 4. COPD with ongoing tobacco use 5. H/o breast CA treated with Adriamycin 6. Recent resection of leiomyosarcoma in R chest (margins +) 7. Carotid stenosis 8. DM2 9. Substance Abuse- UDS + cocaine  10. Hypokalemia      Plan/Discussion:    She remains quite tenuous. CVP improving but co-ox still low despite high-dose milrinone. Renal function stable. Change lasix to po.  Will continue digoxin and spiro. Add low-dose losartan.  Supp K+. Low-dose ivabradine may be an option.   Has had long history of drug abuse. + UDS cocaine. Consult SW for substance abuse. She is considering rehab.   Also need further attention to recent leiomyosarcoma resection with + margins if/when cardiac situation improved.   Metformin on hold due to cardiogenic shock. Start glipizide. Cover with SSI.   Palliative care consult appreciated.    Length of Stay: 3   Bensimhon, Daniel MD  07/17/2015, 11:31 AM  Advanced Heart Failure Team Pager [phone] (M-F; [place])  Please contact [facility] for night-coverage after hours (4p -7a ) and weekends on [email or web address]

## 2015-07-18 DIAGNOSIS — I472 Ventricular tachycardia: Secondary | ICD-10-CM

## 2015-07-18 LAB — GLUCOSE, CAPILLARY
GLUCOSE-CAPILLARY: 227 mg/dL — AB (ref 65–99)
GLUCOSE-CAPILLARY: 265 mg/dL — AB (ref 65–99)
Glucose-Capillary: 241 mg/dL — ABNORMAL HIGH (ref 65–99)
Glucose-Capillary: 294 mg/dL — ABNORMAL HIGH (ref 65–99)

## 2015-07-18 LAB — BASIC METABOLIC PANEL
ANION GAP: 8 (ref 5–15)
ANION GAP: 8 (ref 5–15)
Anion gap: 9 (ref 5–15)
BUN: 24 mg/dL — AB (ref 6–20)
BUN: 21 mg/dL — ABNORMAL HIGH (ref 6–20)
BUN: 17 mg/dL (ref 6–20)
CALCIUM: 8.8 mg/dL — AB (ref 8.9–10.3)
CALCIUM: 8.8 mg/dL — AB (ref 8.9–10.3)
CO2: 30 mmol/L (ref 22–32)
CO2: 30 mmol/L (ref 22–32)
CO2: 29 mmol/L (ref 22–32)
CREATININE: 0.9 mg/dL (ref 0.44–1.00)
Calcium: 8.8 mg/dL — ABNORMAL LOW (ref 8.9–10.3)
Chloride: 94 mmol/L — ABNORMAL LOW (ref 101–111)
Chloride: 94 mmol/L — ABNORMAL LOW (ref 101–111)
Chloride: 94 mmol/L — ABNORMAL LOW (ref 101–111)
Creatinine, Ser: 0.86 mg/dL (ref 0.44–1.00)
Creatinine, Ser: 0.87 mg/dL (ref 0.44–1.00)
GFR calc Af Amer: >60 mL/min (ref >60)
GLUCOSE: 271 mg/dL — AB (ref 65–99)
GLUCOSE: 396 mg/dL — AB (ref 65–99)
Glucose, Bld: 347 mg/dL — ABNORMAL HIGH (ref 65–99)
POTASSIUM: 3.9 mmol/L (ref 3.5–5.1)
Potassium: 4.2 mmol/L (ref 3.5–5.1)
Potassium: 3.7 mmol/L (ref 3.5–5.1)
SODIUM: 133 mmol/L — AB (ref 135–145)
Sodium: 132 mmol/L — ABNORMAL LOW (ref 135–145)
Sodium: 131 mmol/L — ABNORMAL LOW (ref 135–145)

## 2015-07-18 LAB — MAGNESIUM
MAGNESIUM: 1.9 mg/dL (ref 1.7–2.4)
Magnesium: 2 mg/dL (ref 1.7–2.4)

## 2015-07-18 LAB — CARBOXYHEMOGLOBIN
CARBOXYHEMOGLOBIN: 1.3 % (ref 0.5–1.5)
Methemoglobin: 0.8 % (ref 0.0–1.5)
O2 SAT: 64.9 %
TOTAL HEMOGLOBIN: 12.5 g/dL (ref 12.0–16.0)

## 2015-07-18 MED ORDER — MAGNESIUM SULFATE IN D5W 1-5 GM/100ML-% IV SOLN
1.0000 g | Freq: Once | INTRAVENOUS | Status: AC
  Administered 2015-07-18: 1 g via INTRAVENOUS
  Filled 2015-07-18: qty 100

## 2015-07-18 MED ORDER — LOSARTAN POTASSIUM 25 MG PO TABS
25.0000 mg | ORAL_TABLET | Freq: Two times a day (BID) | ORAL | Status: DC
  Administered 2015-07-18: 25 mg via ORAL
  Filled 2015-07-18: qty 1

## 2015-07-18 MED ORDER — GLIPIZIDE 10 MG PO TABS
10.0000 mg | ORAL_TABLET | Freq: Every day | ORAL | Status: DC
  Administered 2015-07-19 – 2015-07-22 (×4): 10 mg via ORAL
  Filled 2015-07-18 (×5): qty 1

## 2015-07-18 MED ORDER — FUROSEMIDE 40 MG PO TABS
40.0000 mg | ORAL_TABLET | Freq: Two times a day (BID) | ORAL | Status: DC
  Administered 2015-07-18: 40 mg via ORAL
  Filled 2015-07-18: qty 1

## 2015-07-18 MED ORDER — POTASSIUM CHLORIDE CRYS ER 20 MEQ PO TBCR
40.0000 meq | EXTENDED_RELEASE_TABLET | Freq: Every day | ORAL | Status: DC
  Administered 2015-07-18 – 2015-07-21 (×4): 40 meq via ORAL
  Filled 2015-07-18 (×4): qty 2

## 2015-07-18 NOTE — Progress Notes (Signed)
Advanced Heart Failure Rounding Note   Subjective:     Admitted with cardiogenic shock. EF 15%. RV down.   On milrinone 0.5 mcg/kg/min. Co-ox up to 65% . CVP 5-6  Several 15-20 beat runs NSVT overnight and this am. Walking halls. Feels great. Diuresing well though. Weight down another pound. Off metformin. Glipizide started yesterday. CBGs still high   + Cocaine on admit. Has been using over the last few weeks.    Objective:   Weight Range:  Vital Signs:   Temp:  [97.5 F (36.4 C)-98.3 F (36.8 C)] 98.1 F (36.7 C) (05/21 1108) Pulse Rate:  [104-114] 110 (05/21 1108) Resp:  [16-33] 23 (05/21 1200) BP: (92-141)/(63-98) 141/95 mmHg (05/21 1108) SpO2:  [91 %-98 %] 96 % (05/21 1108) Weight:  [69.446 kg (153 lb 1.6 oz)] 69.446 kg (153 lb 1.6 oz) (05/21 0453) Last BM Date: 07/17/15  Weight change: Filed Weights   07/16/15 0334 07/17/15 0253 07/18/15 0453  Weight: 72.167 kg (159 lb 1.6 oz) 70.1 kg (154 lb 8.7 oz) 69.446 kg (153 lb 1.6 oz)    Intake/Output:   Intake/Output Summary (Last 24 hours) at 07/18/15 1419 Last data filed at 07/18/15 1400  Gross per 24 hour  Intake 1769.2 ml  Output   3950 ml  Net -2180.8 ml     Physical Exam: CVP 5 General: No resp difficulty. Sitting in bed HEENT: normal Neck: supple. JVP flat  . Carotids 2+ bilat; no bruits. No lymphadenopathy or thryomegaly appreciated. Cor: PMI nondisplaced. Tachy Regular +s3 Lungs: clear Abdomen: soft, nontender, nondistended. No hepatosplenomegaly. No bruits or masses. Good bowel sounds. Extremities: no cyanosis, clubbing, rash, edema Neuro: alert & orientedx3, cranial nerves grossly intact. moves all 4 extremities w/o difficulty. Affect pleasant  Telemetry: Sinus Tach 100-110s several 10-20 beat runs NSVT (viewed personally)  Labs: Basic Metabolic Panel:  Recent Labs Lab 07/15/15 0347 07/16/15 0901 07/17/15 0400 07/17/15 2316 07/18/15 0400  NA 136 138 135 133* 132*  K 3.1* 4.0 3.7 4.2  3.7  CL 94* 95* 95* 94* 94*  CO2 30 32 30 30 30   GLUCOSE 261* 258* 257* 271* 347*  BUN 21* 18 22* 24* 21*  CREATININE 0.97 0.79 0.73 0.90 0.86  CALCIUM 8.9 8.5* 8.6* 8.8* 8.8*  MG  --  1.9  --  1.9  --     Liver Function Tests: No results for input(s): AST, ALT, ALKPHOS, BILITOT, PROT, ALBUMIN in the last 168 hours. No results for input(s): LIPASE, AMYLASE in the last 168 hours. No results for input(s): AMMONIA in the last 168 hours.  CBC:  Recent Labs Lab 07/14/15 1818 07/15/15 0347  WBC 12.5* 12.1*  HGB 12.3 11.3*  HCT 37.5 35.4*  MCV 89.7 89.4  PLT 232 203    Cardiac Enzymes: No results for input(s): CKTOTAL, CKMB, CKMBINDEX, TROPONINI in the last 168 hours.  BNP: BNP (last 3 results)  Recent Labs  07/08/15 1545  BNP 331.2*    ProBNP (last 3 results) No results for input(s): PROBNP in the last 8760 hours.    Other results:  Imaging: No results found.   Medications:     Scheduled Medications: . aspirin EC  81 mg Oral Daily  . atorvastatin  20 mg Oral q1800  . digoxin  0.125 mg Oral Daily  . enoxaparin (LOVENOX) injection  40 mg Subcutaneous Q24H  . furosemide  80 mg Oral BID  . gabapentin  800 mg Oral TID  . glipiZIDE  5 mg  Oral QAC breakfast  . insulin aspart  0-15 Units Subcutaneous TID WC  . letrozole  2.5 mg Oral q morning - 10a  . LORazepam  2 mg Oral QHS  . losartan  25 mg Oral Daily  . nicotine  21 mg Transdermal Daily  . potassium chloride SA  40 mEq Oral Daily  . sodium chloride flush  10-40 mL Intracatheter Q12H  . sodium chloride flush  3 mL Intravenous Q12H  . spironolactone  25 mg Oral Daily  . venlafaxine XR  150 mg Oral Q breakfast    Infusions: . milrinone 0.5 mcg/kg/min (07/18/15 1153)    PRN Medications: sodium chloride, acetaminophen, albuterol, hydrOXYzine, magnesium hydroxide, ondansetron (ZOFRAN) IV, sodium chloride flush, sodium chloride flush   Assessment:   1.  Cardiogenic shock 2. Acute on chronic  systolic HF due to NICM EF 20% 3. Non-obstructive CAD 4. COPD with ongoing tobacco use 5. H/o breast CA treated with Adriamycin 6. Recent resection of leiomyosarcoma in R chest (margins +) 7. Carotid stenosis 8. DM2 9. Substance Abuse- UDS + cocaine  10. Hypokalemia  11. NSVT  Plan/Discussion:    Co-ox now much improved. Will decrease milrinone to 0.375 particularly with NSVT.   Volume status and renal function stable on po lasix. Will decrease lasix to 40 po bid.  Will continue digoxin and spiro. Increase losartan to 25 bid. May be able to switch to [facility] soon if BP tolerates. No b-blocker yet due to low output.     With NSVT will keep K+ >= 4.0 and Mg >= 2.0  Has had long history of drug abuse. + UDS cocaine. Consult SW for substance abuse. She is considering rehab.   Also need further attention to recent leiomyosarcoma resection with + margins if/when cardiac situation improved.   Metformin on hold due to cardiogenic shock. Increase glipizide. Cover with SSI.   Palliative care consult appreciated.    Length of Stay: 4   Bensimhon, Daniel MD  07/18/2015, 2:19 PM  Advanced Heart Failure Team Pager [phone] (M-F; [place])  Please contact [facility] for night-coverage after hours (4p -7a ) and weekends on [email or web address]

## 2015-07-18 NOTE — Progress Notes (Signed)
Pt had 17 beats of VT. Pt lying in bed. Pt states she "felt a flush feeling come and go". Dr Rosanna Randy on call for Cardiology paged. Orders received. Will continue to monitor.

## 2015-07-19 ENCOUNTER — Ambulatory Visit (HOSPITAL_COMMUNITY): Payer: Medicare Other | Admitting: Hematology & Oncology

## 2015-07-19 LAB — CARBOXYHEMOGLOBIN
Carboxyhemoglobin: 1.5 % (ref 0.5–1.5)
Methemoglobin: 0.8 % (ref 0.0–1.5)
O2 SAT: 59.3 %
Total hemoglobin: 12.8 g/dL (ref 12.0–16.0)

## 2015-07-19 LAB — BASIC METABOLIC PANEL
Anion gap: 7 (ref 5–15)
BUN: 17 mg/dL (ref 6–20)
CALCIUM: 8.8 mg/dL — AB (ref 8.9–10.3)
CO2: 30 mmol/L (ref 22–32)
CREATININE: 0.75 mg/dL (ref 0.44–1.00)
Chloride: 96 mmol/L — ABNORMAL LOW (ref 101–111)
GFR calc non Af Amer: >60 mL/min (ref >60)
GLUCOSE: 287 mg/dL — AB (ref 65–99)
Potassium: 3.8 mmol/L (ref 3.5–5.1)
Sodium: 133 mmol/L — ABNORMAL LOW (ref 135–145)

## 2015-07-19 LAB — GLUCOSE, CAPILLARY
GLUCOSE-CAPILLARY: 334 mg/dL — AB (ref 65–99)
GLUCOSE-CAPILLARY: 97 mg/dL (ref 65–99)
GLUCOSE-CAPILLARY: 312 mg/dL — AB (ref 65–99)
Glucose-Capillary: 250 mg/dL — ABNORMAL HIGH (ref 65–99)

## 2015-07-19 MED ORDER — INSULIN ASPART 100 UNIT/ML ~~LOC~~ SOLN
0.0000 [IU] | Freq: Every day | SUBCUTANEOUS | Status: DC
  Administered 2015-07-19: 4 [IU] via SUBCUTANEOUS
  Administered 2015-07-20: 3 [IU] via SUBCUTANEOUS
  Administered 2015-07-21: 2 [IU] via SUBCUTANEOUS

## 2015-07-19 MED ORDER — SACUBITRIL-VALSARTAN 24-26 MG PO TABS
1.0000 | ORAL_TABLET | Freq: Two times a day (BID) | ORAL | Status: DC
  Administered 2015-07-19: 1 via ORAL
  Filled 2015-07-19 (×2): qty 1

## 2015-07-19 MED ORDER — SODIUM CHLORIDE 0.9 % IV SOLN: 250.0000 mL | INTRAVENOUS | Status: DC | PRN

## 2015-07-19 MED ORDER — INSULIN ASPART 100 UNIT/ML ~~LOC~~ SOLN
0.0000 [IU] | Freq: Three times a day (TID) | SUBCUTANEOUS | Status: DC
  Administered 2015-07-20: 5 [IU] via SUBCUTANEOUS
  Administered 2015-07-20: 8 [IU] via SUBCUTANEOUS
  Administered 2015-07-21: 5 [IU] via SUBCUTANEOUS
  Administered 2015-07-21 – 2015-07-22 (×2): 8 [IU] via SUBCUTANEOUS
  Administered 2015-07-22: 5 [IU] via SUBCUTANEOUS
  Administered 2015-07-22: 3 [IU] via SUBCUTANEOUS
  Administered 2015-07-23: 2 [IU] via SUBCUTANEOUS

## 2015-07-19 MED ORDER — SACUBITRIL-VALSARTAN 24-26 MG PO TABS
1.0000 | ORAL_TABLET | Freq: Two times a day (BID) | ORAL | Status: DC
  Administered 2015-07-19 – 2015-07-21 (×5): 1 via ORAL
  Filled 2015-07-19 (×6): qty 1

## 2015-07-19 NOTE — Progress Notes (Signed)
Daily Progress Note   Patient Name: Emily [name]       Date: 07/19/2015 DOB: May 05, 1960  Age: 55 y.o. MRN#: [id number] Attending Physician: Jolaine Artist, MD Primary Care Physician: [name], DO Admit Date: 07/14/2015  Reason for Consultation/Follow-up: Establishing goals of care and Psychosocial/spiritual support  Subjective: - continued conversation regarding diagnosis, prognosis, trajectory of CHF specifically to medication, diet and life style compliance  -discussed importance of documentation of AD and HPOA- consulted Spiritual Care for assitance  Length of Stay: 5  Current Medications: Scheduled Meds:  . aspirin EC  81 mg Oral Daily  . atorvastatin  20 mg Oral q1800  . digoxin  0.125 mg Oral Daily  . enoxaparin (LOVENOX) injection  40 mg Subcutaneous Q24H  . gabapentin  800 mg Oral TID  . glipiZIDE  10 mg Oral QAC breakfast  . insulin aspart  0-15 Units Subcutaneous TID WC  . letrozole  2.5 mg Oral q morning - 10a  . LORazepam  2 mg Oral QHS  . nicotine  21 mg Transdermal Daily  . potassium chloride SA  40 mEq Oral Daily  . sacubitril-valsartan  1 tablet Oral BID  . sodium chloride flush  10-40 mL Intracatheter Q12H  . sodium chloride flush  3 mL Intravenous Q12H  . spironolactone  25 mg Oral Daily  . venlafaxine XR  150 mg Oral Q breakfast    Continuous Infusions: . milrinone 0.25 mcg/kg/min (07/19/15 1005)    PRN Meds: sodium chloride, acetaminophen, albuterol, hydrOXYzine, magnesium hydroxide, ondansetron (ZOFRAN) IV, sodium chloride flush, sodium chloride flush  Physical Exam  Constitutional: She appears well-developed. She is cooperative.  Cardiovascular: Normal rate, regular rhythm and normal heart sounds.   Pulmonary/Chest: Effort normal and breath  sounds normal.  Neurological: She is alert.  Skin: Skin is warm and dry.            Vital Signs: BP 90/80 mmHg  Pulse 112  Temp(Src) 98.2 F (36.8 C) (Oral)  Resp 16  Ht 5\' 5"  (1.651 m)  Wt 70.1 kg (154 lb 8.7 oz)  BMI 25.72 kg/m2  SpO2 95% SpO2: SpO2: 95 % O2 Device: O2 Device: Not Delivered O2 Flow Rate: O2 Flow Rate (L/min): 2 L/min  Intake/output summary:  Intake/Output Summary (Last 24 hours) at 07/19/15 1303 Last data filed  at 07/19/15 1000  Gross per 24 hour  Intake 1462.74 ml  Output   1950 ml  Net -487.26 ml   LBM: Last BM Date: 07/17/15 Baseline Weight: Weight: 71.668 kg (158 lb) Most recent weight: Weight: 70.1 kg (154 lb 8.7 oz)       Palliative Assessment/Data: 60-70%    Flowsheet Rows        Most Recent Value   Intake Tab    Referral Department  Cardiology   Unit at Time of Referral  ICU   Palliative Care Primary Diagnosis  Cardiac   Date Notified  07/15/15   Palliative Care Type  New Palliative care   Reason for referral  Clarify Goals of Care   Date of Admission  07/14/15   Date first seen by Palliative Care  07/15/15   # of days Palliative referral response time  0 Day(s)   # of days IP prior to Palliative referral  1   Clinical Assessment    Psychosocial & Spiritual Assessment    Palliative Care Outcomes       Patient Active Problem List   Diagnosis Date Noted  . DNR (do not resuscitate) discussion   . Palliative care encounter   . Acute on chronic systolic (congestive) heart failure ([place]) 07/11/2015  . Wheeze 10/02/2014  . History of breast cancer 10/02/2014  . Sciatica of left side 09/30/2014  . Vitamin D deficiency 02/24/2014  . Bullous rash 12/08/2013  . Carotid stenosis 11/12/2013  . Chronic SI joint pain 10/27/2013  . Raynaud's phenomenon 10/27/2013  . Skin bulla 10/27/2013  . Dizzy 06/09/2013  . Preventative health care 04/29/2013  . History of osteoporosis 04/29/2013  . Neuropathic pain 04/08/2013  . Choledocholithiasis  12/19/2012  . Smoker 12/11/2012  . Psoriasis 12/03/2012  . Fatty liver disease, nonalcoholic [id number]  . CAD (coronary artery disease) 11/12/2012  . Chronic systolic CHF (congestive heart failure) ([place]) 11/12/2012  . Cardiogenic shock ([place]) 10/30/2012  . GERD (gastroesophageal reflux disease)   . Skin thickening   . Iron deficiency anemia   . Dyslipidemia   . Insomnia   . DM (diabetes mellitus), type 2, uncontrolled ([place]) 01/01/2012  . History of acute pancreatitis 12/31/2011  . ADENOCARCINOMA, BREAST 12/01/2008    Palliative Care Assessment & Plan   Patient Profile:  55 y.o. female admitted on 07/14/2015 ER + R breast cancer s/p R lumpectomy, DM2, cholelithiasis s/p cholecystectomy (02/2013), COPD and systolic CHF due to nonischemic cardiomyopathy. Patient was admitted in 09/2012 with acute pulmonary edema and cardiogenic shock. LHC showed moderate nonobstructive CAD (40-50% mLAD, 50-70% LCx, 50% PDA). She was started on milrinone and diuresed. Cause of cardiomyopathy suspected to be Adriamycin toxicity. EF recovered to 45-50% in 4/15.   Recently had surgery to remove R chest leiomyosarcoma. Margins not clear so needs larger resection. Her oncologist is Dr Whitney Muse in [place]  Evaluated in office last week with recurrent HF symptoms - Class IIIb-IV. ECHO EF 20%, admitted and stabilized, close to ready for discharge   Recommendations/Plan:   Once medically stable dc home with mother locally, pursue substance abuse support.  Encouraged thorough psychiatric evaluation for diagnosis and medication adjustmetns   Goals of Care and Additional Recommendations:  Patient is open to all offered and available medical interventions to prolong life    Code Status:     Code Status Orders        Start     Ordered   07/14/15 1747  Full code   Continuous  07/14/15 1746    Code Status History    Date Active Date Inactive Code Status Order ID Comments User Context    03/06/2013 11:04 AM 03/09/2013  4:47 PM Full Code [id number]  Rolm Bookbinder, MD Inpatient   12/19/2012  4:17 PM 12/22/2012  5:29 PM Full Code [id number]  Allie Bossier, MD Inpatient   10/26/2012  8:29 PM 11/05/2012  2:11 PM Full Code [id number]  Janece Canterbury, MD Inpatient   12/31/2011  4:22 PM 01/02/2012  4:26 PM Full Code [id number]  Samuella Cota, MD Inpatient      Strongly encourged    Discharge Planning:   Likely home to mother's home with home health services   Thank you for allowing the Palliative Medicine Team to assist in the care of this patient.   Time In: 1300 Time Out: 1335 Total Time 35 min Prolonged Time Billed  no       Greater than 50%  of this time was spent counseling and coordinating care related to the above assessment and plan.  Wadie Lessen, NP  Please contact Palliative Medicine Team phone at [phone] for questions and concerns.

## 2015-07-19 NOTE — Clinical Social Work Note (Addendum)
CSW received consult for patient needing advanced directive paperwork completed.  Chaplain office completes Advanced directive paperwork with patient.  [name]. Corley, MSW, [place] 07/19/2015 3:54 PM

## 2015-07-19 NOTE — Consult Note (Signed)
[place] conferred with RN that literature for Advanced Directive and HCPOA was given to pt; Once completed [place] can aid in facilitating notary and witness on 5/23.  Gwynn Burly 1:09 PM

## 2015-07-19 NOTE — Progress Notes (Signed)
CARDIAC REHAB PHASE I   PRE:  Rate/Rhythm: 115 ST  BP:  Supine: 102/88 calf  Sitting:   Standing:    SaO2: 93%RA  MODE:  Ambulation: 700 ft   POST:  Rate/Rhythm: 119 ST  BP:  Supine: 90/80  Took several times on  calf  Sitting:   Standing:    SaO2: 95%RA 1120-1150 Pt walked earlier independently. Walked 700 ft on RA with me and tolerated well. Able to walk and talk. Gave CHF booklet even though she has had one before. Discussed zones and when to call MD. Gave low sodium and diabetic diet and reviewed carb counting. Pt very receptive to ed.    Graylon Good, RN BSN  07/19/2015 11:47 AM

## 2015-07-19 NOTE — Progress Notes (Signed)
Advanced Heart Failure Rounding Note   Subjective:     Admitted with cardiogenic shock. EF 15%. RV down. + Cocaine on admit. Has been using over the last few weeks.   On milrinone 0.375 mcg/kg/min.  Yesterday losartan increased to 25 mg twice a day. Todays CO-OX 59%.  Denies SOB.    Objective:   Weight Range:  Vital Signs:   Temp:  [98.1 F (36.7 C)-98.3 F (36.8 C)] 98.2 F (36.8 C) (05/22 0725) Pulse Rate:  [104-115] 107 (05/22 0415) Resp:  [17-33] 20 (05/22 0725) BP: (104-141)/(66-95) 104/77 mmHg (05/22 0725) SpO2:  [91 %-96 %] 95 % (05/22 0725) Weight:  [154 lb 8.7 oz (70.1 kg)] 154 lb 8.7 oz (70.1 kg) (05/22 0415) Last BM Date: 07/17/15  Weight change: Filed Weights   07/17/15 0253 07/18/15 0453 07/19/15 0415  Weight: 154 lb 8.7 oz (70.1 kg) 153 lb 1.6 oz (69.446 kg) 154 lb 8.7 oz (70.1 kg)    Intake/Output:   Intake/Output Summary (Last 24 hours) at 07/19/15 0756 Last data filed at 07/19/15 0600  Gross per 24 hour  Intake 1882.62 ml  Output   3300 ml  Net -1417.38 ml     Physical Exam: CVP 10  General: No resp difficulty. Sitting in bed HEENT: normal Neck: supple. JVP  9-10 . Carotids 2+ bilat; no bruits. No lymphadenopathy or thryomegaly appreciated. Cor: PMI nondisplaced. Tachy Regular +s3 Lungs: clear Abdomen: soft, nontender, nondistended. No hepatosplenomegaly. No bruits or masses. Good bowel sounds. Extremities: no cyanosis, clubbing, rash, edema Neuro: alert & orientedx3, cranial nerves grossly intact. moves all 4 extremities w/o difficulty. Affect pleasant  Telemetry: Sinus Tach 110-120s  PVCs.   Labs: Basic Metabolic Panel:  Recent Labs Lab 07/16/15 0901 07/17/15 0400 07/17/15 2316 07/18/15 0400 07/18/15 1342 07/19/15 0430  NA 138 135 133* 132* 131* 133*  K 4.0 3.7 4.2 3.7 3.9 3.8  CL 95* 95* 94* 94* 94* 96*  CO2 32 30 30 30 29 30   GLUCOSE 258* 257* 271* 347* 396* 287*  BUN 18 22* 24* 21* 17 17  CREATININE 0.79 0.73 0.90  0.86 0.87 0.75  CALCIUM 8.5* 8.6* 8.8* 8.8* 8.8* 8.8*  MG 1.9  --  1.9  --  2.0  --     Liver Function Tests: No results for input(s): AST, ALT, ALKPHOS, BILITOT, PROT, ALBUMIN in the last 168 hours. No results for input(s): LIPASE, AMYLASE in the last 168 hours. No results for input(s): AMMONIA in the last 168 hours.  CBC:  Recent Labs Lab 07/14/15 1818 07/15/15 0347  WBC 12.5* 12.1*  HGB 12.3 11.3*  HCT 37.5 35.4*  MCV 89.7 89.4  PLT 232 203    Cardiac Enzymes: No results for input(s): CKTOTAL, CKMB, CKMBINDEX, TROPONINI in the last 168 hours.  BNP: BNP (last 3 results)  Recent Labs  07/08/15 1545  BNP 331.2*    ProBNP (last 3 results) No results for input(s): PROBNP in the last 8760 hours.    Other results:  Imaging: No results found.   Medications:     Scheduled Medications: . aspirin EC  81 mg Oral Daily  . atorvastatin  20 mg Oral q1800  . digoxin  0.125 mg Oral Daily  . enoxaparin (LOVENOX) injection  40 mg Subcutaneous Q24H  . furosemide  40 mg Oral BID  . gabapentin  800 mg Oral TID  . glipiZIDE  10 mg Oral QAC breakfast  . insulin aspart  0-15 Units Subcutaneous TID WC  .  letrozole  2.5 mg Oral q morning - 10a  . LORazepam  2 mg Oral QHS  . losartan  25 mg Oral BID  . nicotine  21 mg Transdermal Daily  . potassium chloride SA  40 mEq Oral Daily  . sodium chloride flush  10-40 mL Intracatheter Q12H  . sodium chloride flush  3 mL Intravenous Q12H  . spironolactone  25 mg Oral Daily  . venlafaxine XR  150 mg Oral Q breakfast    Infusions: . milrinone 0.375 mcg/kg/min (07/18/15 2100)    PRN Medications: sodium chloride, acetaminophen, albuterol, hydrOXYzine, magnesium hydroxide, ondansetron (ZOFRAN) IV, sodium chloride flush, sodium chloride flush   Assessment:   1.  Cardiogenic shock 2. Acute on chronic systolic HF due to NICM EF 20% 3. Non-obstructive CAD 4. COPD with ongoing tobacco use 5. H/o breast CA treated with  Adriamycin 6. Recent resection of leiomyosarcoma in R chest (margins +) 7. Carotid stenosis 8. DM2 9. Substance Abuse- UDS + cocaine  10. Hypokalemia  11. NSVT  Plan/Discussion:     Today CO-OX 59% on milrinone 0.375  Mcg. Cut back milrinone to 0.25 mcg. Will continue digoxin and spiro. Stop losartan and lasix. Switch to entresto 24-26 mg twice a day. Plan to recheck CVP this afternoon. No b-blocker yet due to low output.     With NSVT will keep K+ >= 4.0 and Mg >= 2.0.   Has had long history of drug abuse. + UDS cocaine. Consult SW for substance abuse. She is considering rehab.   Also need further attention to recent leiomyosarcoma resection with + margins if/when cardiac situation improved.   Metformin on hold due to cardiogenic shock. Increase glipizide. Cover with SSI.   Palliative care consult appreciated.    Length of Stay: 5   Amy Clegg NP-C  07/19/2015, 7:56 AM  Advanced Heart Failure Team Pager [phone] (M-F; 7a - 4p)  Please contact [facility] for night-coverage after hours (4p -7a ) and weekends on [email or web address]  Patient seen with NP, agree with above note.  CVP 9.  We are going to decrease milrinone to 0.25 today.  Stop IV Lasix and losartan today, add Entresto 24/26 bid.  Will check CVP this afternoon to determine Lasix dose.    Not candidate for advanced therapies with ongoing substance abuse.   Loralie Champagne 07/19/2015 9:21 AM

## 2015-07-20 LAB — CARBOXYHEMOGLOBIN
CARBOXYHEMOGLOBIN: 1.1 % (ref 0.5–1.5)
Methemoglobin: 1 % (ref 0.0–1.5)
O2 SAT: 60.9 %
TOTAL HEMOGLOBIN: 13 g/dL (ref 12.0–16.0)

## 2015-07-20 LAB — BASIC METABOLIC PANEL
Anion gap: 8 (ref 5–15)
BUN: 15 mg/dL (ref 6–20)
CALCIUM: 8.9 mg/dL (ref 8.9–10.3)
CHLORIDE: 101 mmol/L (ref 101–111)
CO2: 28 mmol/L (ref 22–32)
CREATININE: 0.58 mg/dL (ref 0.44–1.00)
GFR calc non Af Amer: >60 mL/min (ref >60)
GLUCOSE: 222 mg/dL — AB (ref 65–99)
Potassium: 4.1 mmol/L (ref 3.5–5.1)
Sodium: 137 mmol/L (ref 135–145)

## 2015-07-20 LAB — GLUCOSE, CAPILLARY
GLUCOSE-CAPILLARY: 239 mg/dL — AB (ref 65–99)
GLUCOSE-CAPILLARY: 254 mg/dL — AB (ref 65–99)
Glucose-Capillary: 182 mg/dL — ABNORMAL HIGH (ref 65–99)
Glucose-Capillary: 275 mg/dL — ABNORMAL HIGH (ref 65–99)

## 2015-07-20 MED ORDER — FUROSEMIDE 20 MG PO TABS
20.0000 mg | ORAL_TABLET | Freq: Every day | ORAL | Status: DC
  Administered 2015-07-20 – 2015-07-22 (×3): 20 mg via ORAL
  Filled 2015-07-20 (×4): qty 1

## 2015-07-20 NOTE — Progress Notes (Signed)
Spoke w pt. She is on entresto. She states she has 30day free of meds at home now. She had started on this pta. She states clinic working w company to help her get her entresto thru drug company.

## 2015-07-20 NOTE — Progress Notes (Signed)
Advanced Heart Failure Rounding Note   Subjective:     Admitted with cardiogenic shock. EF 15%. RV down. + Cocaine on admit. Has been using over the last few weeks.   Yesterday milrinone was cut back to 0.25 mcg. Also losartan and lasix stopped and switched to entresto. Todays CO-OX 61%. Able to walk 700 feet.   Denies SOB.    Objective:   Weight Range:  Vital Signs:   Temp:  [97.3 F (36.3 C)-97.9 F (36.6 C)] 97.9 F (36.6 C) (05/23 0748) Pulse Rate:  [102-118] 112 (05/22 1628) Resp:  [14-24] 20 (05/23 0748) BP: (90-119)/(66-80) 119/72 mmHg (05/23 0748) SpO2:  [93 %-96 %] 95 % (05/23 0748) Weight:  [156 lb 6.4 oz (70.943 kg)] 156 lb 6.4 oz (70.943 kg) (05/23 0322) Last BM Date: 07/18/15  Weight change: Filed Weights   07/18/15 0453 07/19/15 0415 07/20/15 0322  Weight: 153 lb 1.6 oz (69.446 kg) 154 lb 8.7 oz (70.1 kg) 156 lb 6.4 oz (70.943 kg)    Intake/Output:   Intake/Output Summary (Last 24 hours) at 07/20/15 0752 Last data filed at 07/20/15 0700  Gross per 24 hour  Intake 1613.77 ml  Output   2700 ml  Net -1086.23 ml     Physical Exam CVP 8-9 General: No resp difficulty. Sitting in bed HEENT: normal Neck: supple. JVP  8-9. Carotids 2+ bilat; no bruits. No lymphadenopathy or thryomegaly appreciated. Cor: PMI nondisplaced. Tachy Regular +s3 Lungs: clear Abdomen: soft, nontender, nondistended. No hepatosplenomegaly. No bruits or masses. Good bowel sounds. Extremities: no cyanosis, clubbing, rash, edema Neuro: alert & orientedx3, cranial nerves grossly intact. moves all 4 extremities w/o difficulty. Affect pleasant  Telemetry: Sinus Tach 90-100s   Labs: Basic Metabolic Panel:  Recent Labs Lab 07/16/15 0901  07/17/15 2316 07/18/15 0400 07/18/15 1342 07/19/15 0430 07/20/15 0440  NA 138  < > 133* 132* 131* 133* 137  K 4.0  < > 4.2 3.7 3.9 3.8 4.1  CL 95*  < > 94* 94* 94* 96* 101  CO2 32  < > 30 30 29 30 28   GLUCOSE 258*  < > 271* 347* 396*  287* 222*  BUN 18  < > 24* 21* 17 17 15   CREATININE 0.79  < > 0.90 0.86 0.87 0.75 0.58  CALCIUM 8.5*  < > 8.8* 8.8* 8.8* 8.8* 8.9  MG 1.9  --  1.9  --  2.0  --   --   < > = values in this interval not displayed.  Liver Function Tests: No results for input(s): AST, ALT, ALKPHOS, BILITOT, PROT, ALBUMIN in the last 168 hours. No results for input(s): LIPASE, AMYLASE in the last 168 hours. No results for input(s): AMMONIA in the last 168 hours.  CBC:  Recent Labs Lab 07/14/15 1818 07/15/15 0347  WBC 12.5* 12.1*  HGB 12.3 11.3*  HCT 37.5 35.4*  MCV 89.7 89.4  PLT 232 203    Cardiac Enzymes: No results for input(s): CKTOTAL, CKMB, CKMBINDEX, TROPONINI in the last 168 hours.  BNP: BNP (last 3 results)  Recent Labs  07/08/15 1545  BNP 331.2*    ProBNP (last 3 results) No results for input(s): PROBNP in the last 8760 hours.    Other results:  Imaging: No results found.   Medications:     Scheduled Medications: . aspirin EC  81 mg Oral Daily  . atorvastatin  20 mg Oral q1800  . digoxin  0.125 mg Oral Daily  . enoxaparin (LOVENOX) injection  40 mg Subcutaneous Q24H  . gabapentin  800 mg Oral TID  . glipiZIDE  10 mg Oral QAC breakfast  . insulin aspart  0-15 Units Subcutaneous TID WC  . insulin aspart  0-15 Units Subcutaneous TID WC  . insulin aspart  0-5 Units Subcutaneous QHS  . letrozole  2.5 mg Oral q morning - 10a  . LORazepam  2 mg Oral QHS  . nicotine  21 mg Transdermal Daily  . potassium chloride SA  40 mEq Oral Daily  . sacubitril-valsartan  1 tablet Oral BID  . sodium chloride flush  10-40 mL Intracatheter Q12H  . spironolactone  25 mg Oral Daily  . venlafaxine XR  150 mg Oral Q breakfast    Infusions: . milrinone 0.25 mcg/kg/min (07/20/15 0105)    PRN Medications: sodium chloride, acetaminophen, albuterol, hydrOXYzine, magnesium hydroxide, ondansetron (ZOFRAN) IV, sodium chloride flush   Assessment:   1.  Cardiogenic shock 2. Acute on  chronic systolic HF due to NICM EF 20% 3. Non-obstructive CAD 4. COPD with ongoing tobacco use 5. H/o breast CA treated with Adriamycin 6. Recent resection of leiomyosarcoma in R chest (margins +) 7. Carotid stenosis 8. DM2 9. Substance Abuse- UDS + cocaine  10. Hypokalemia  11. NSVT  Plan/Discussion:     Today CO-OX 61% on milrinone 0.33mcg.  Cut back milrinone to 0.125 mcg. Hopefully can we can stop tomorrow. Volume status up a little. Add lasix 20 mg daily. Will continue digoxin and spiro. Continue entresto 24-26 mg twice a day. No b-blocker yet due to low output.   Renal function stable. Check dig level in am.   With NSVT will keep K+ >= 4.0 and Mg >= 2.0.   Has had long history of drug abuse. + UDS cocaine. Consult SW for substance abuse. She is considering rehab.   Also need further attention to recent leiomyosarcoma resection with + margins if/when cardiac situation improved.   Metformin on hold due to cardiogenic shock. Increase glipizide. Cover with SSI.   Palliative care consult appreciated.    Length of Stay: Show Low NP-C  07/20/2015, 7:52 AM  Advanced Heart Failure Team Pager [phone] (M-F; 7a - 4p)  Please contact [facility] for night-coverage after hours (4p -7a ) and weekends on [email or web address]   Patient seen and examined with Darrick Grinder, NP. We discussed all aspects of the encounter. I agree with the assessment and plan as stated above.   Still tenuous but improved. Agree with cutting back milrinone. Now on Entresto. Resume lasix 20 daily. Continue with some episodes of NSVT. Supping electrolytes. CBGs improving.  Ozella Comins,MD 9:07 AM

## 2015-07-20 NOTE — Progress Notes (Signed)
CARDIAC REHAB PHASE I   PRE:  Rate/Rhythm: 107 ST  BP:  Supine: 136/78  Sitting:   Standing:    SaO2: 96%RA  MODE:  Ambulation: 1050 ft   POST:  Rate/Rhythm: 112 ST  BP:  Supine: 107/65  Sitting:   Standing:    SaO2: 97%RA 1135-1200 Pt walked 1050 ft with steady gait. Tolerated well. Discussed smoking cessation and gave fake cigarette and tobacco cessation sheet. Pt stated she quit once for 6 weeks. Gave ex ed and offered CRP 2. Pt declined program at this time. Will walk for ex. Since pt can walk independently and ed completed, will see only as low priority.   Graylon Good, RN BSN  07/20/2015 11:55 AM

## 2015-07-21 LAB — CARBOXYHEMOGLOBIN
CARBOXYHEMOGLOBIN: 1 % (ref 0.5–1.5)
CARBOXYHEMOGLOBIN: 1.5 % (ref 0.5–1.5)
METHEMOGLOBIN: 1 % (ref 0.0–1.5)
Methemoglobin: 0.8 % (ref 0.0–1.5)
O2 SAT: 65.4 %
O2 Saturation: 63.2 %
TOTAL HEMOGLOBIN: 12.7 g/dL (ref 12.0–16.0)
Total hemoglobin: 13.1 g/dL (ref 12.0–16.0)

## 2015-07-21 LAB — GLUCOSE, CAPILLARY
Glucose-Capillary: 239 mg/dL — ABNORMAL HIGH (ref 65–99)
Glucose-Capillary: 271 mg/dL — ABNORMAL HIGH (ref 65–99)
Glucose-Capillary: 152 mg/dL — ABNORMAL HIGH (ref 65–99)
Glucose-Capillary: 232 mg/dL — ABNORMAL HIGH (ref 65–99)

## 2015-07-21 LAB — BASIC METABOLIC PANEL
Anion gap: 7 (ref 5–15)
BUN: 12 mg/dL (ref 6–20)
CALCIUM: 9.6 mg/dL (ref 8.9–10.3)
CO2: 32 mmol/L (ref 22–32)
CREATININE: 0.59 mg/dL (ref 0.44–1.00)
Chloride: 98 mmol/L — ABNORMAL LOW (ref 101–111)
GFR calc Af Amer: >60 mL/min (ref >60)
GLUCOSE: 260 mg/dL — AB (ref 65–99)
POTASSIUM: 4.1 mmol/L (ref 3.5–5.1)
SODIUM: 137 mmol/L (ref 135–145)

## 2015-07-21 LAB — DIGOXIN LEVEL: DIGOXIN LVL: 0.2 ng/mL — AB (ref 0.8–2.0)

## 2015-07-21 NOTE — Progress Notes (Signed)
Advanced Heart Failure Rounding Note   Subjective:     Admitted with cardiogenic shock. EF 15%. RV down. + Cocaine on admit. Has been using over the last few weeks.   Yesterday milrinone was cut back to 0.125 mcg. Coox 65.4% today.  Tolerating Entresto well.  CVP 7  Feeling good this morning. Walking halls without difficulty.  Denies lightheadedness or dizziness on Entresto.  Has a small nodule at edge of her incision scar that is worrying her.   Out 0.6 L though up 1 lb with addition of 20 mg lasix.   Objective:   Weight Range:  Vital Signs:   Temp:  [97.3 F (36.3 C)-98.2 F (36.8 C)] 97.6 F (36.4 C) (05/24 0819) Pulse Rate:  [102] 102 (05/23 1600) Resp:  [17-35] 17 (05/24 0330) BP: (102-136)/(58-78) 114/58 mmHg (05/24 0330) SpO2:  [91 %-98 %] 91 % (05/24 0330) Weight:  [157 lb 6.5 oz (71.4 kg)] 157 lb 6.5 oz (71.4 kg) (05/24 0330) Last BM Date: 07/18/15  Weight change: Filed Weights   07/19/15 0415 07/20/15 0322 07/21/15 0330  Weight: 154 lb 8.7 oz (70.1 kg) 156 lb 6.4 oz (70.943 kg) 157 lb 6.5 oz (71.4 kg)    Intake/Output:   Intake/Output Summary (Last 24 hours) at 07/21/15 0832 Last data filed at 07/21/15 0700  Gross per 24 hour  Intake 792.01 ml  Output   2000 ml  Net -1207.99 ml     Physical Exam CVP 7-8 General: No resp difficulty. Sitting in bed HEENT: normal Neck: supple. JVP  7-8. Carotids 2+ bilat; no bruits. No lymphadenopathy or thyromegaly appreciated. Incisional scar noted with small nodule on upper outer portion.  Cor: PMI nondisplaced. Tachy Regular +s3 Lungs: clear Abdomen: soft, nontender, nondistended. No hepatosplenomegaly. No bruits or masses. Good bowel sounds. Extremities: no cyanosis, clubbing, rash, edema Neuro: alert & orientedx3, cranial nerves grossly intact. moves all 4 extremities w/o difficulty. Affect pleasant  Telemetry: Sinus Tach 90-100s   Labs: Basic Metabolic Panel:  Recent Labs Lab 07/16/15 0901   07/17/15 2316 07/18/15 0400 07/18/15 1342 07/19/15 0430 07/20/15 0440 07/21/15 0348  NA 138  < > 133* 132* 131* 133* 137 137  K 4.0  < > 4.2 3.7 3.9 3.8 4.1 4.1  CL 95*  < > 94* 94* 94* 96* 101 98*  CO2 32  < > 30 30 29 30 28  32  GLUCOSE 258*  < > 271* 347* 396* 287* 222* 260*  BUN 18  < > 24* 21* 17 17 15 12   CREATININE 0.79  < > 0.90 0.86 0.87 0.75 0.58 0.59  CALCIUM 8.5*  < > 8.8* 8.8* 8.8* 8.8* 8.9 9.6  MG 1.9  --  1.9  --  2.0  --   --   --   < > = values in this interval not displayed.  Liver Function Tests: No results for input(s): AST, ALT, ALKPHOS, BILITOT, PROT, ALBUMIN in the last 168 hours. No results for input(s): LIPASE, AMYLASE in the last 168 hours. No results for input(s): AMMONIA in the last 168 hours.  CBC:  Recent Labs Lab 07/14/15 1818 07/15/15 0347  WBC 12.5* 12.1*  HGB 12.3 11.3*  HCT 37.5 35.4*  MCV 89.7 89.4  PLT 232 203    Cardiac Enzymes: No results for input(s): CKTOTAL, CKMB, CKMBINDEX, TROPONINI in the last 168 hours.  BNP: BNP (last 3 results)  Recent Labs  07/08/15 1545  BNP 331.2*    ProBNP (last 3 results)  No results for input(s): PROBNP in the last 8760 hours.    Other results:  Imaging: No results found.   Medications:     Scheduled Medications: . aspirin EC  81 mg Oral Daily  . atorvastatin  20 mg Oral q1800  . digoxin  0.125 mg Oral Daily  . enoxaparin (LOVENOX) injection  40 mg Subcutaneous Q24H  . furosemide  20 mg Oral Daily  . gabapentin  800 mg Oral TID  . glipiZIDE  10 mg Oral QAC breakfast  . insulin aspart  0-15 Units Subcutaneous TID WC  . insulin aspart  0-5 Units Subcutaneous QHS  . letrozole  2.5 mg Oral q morning - 10a  . LORazepam  2 mg Oral QHS  . nicotine  21 mg Transdermal Daily  . potassium chloride SA  40 mEq Oral Daily  . sacubitril-valsartan  1 tablet Oral BID  . sodium chloride flush  10-40 mL Intracatheter Q12H  . spironolactone  25 mg Oral Daily  . venlafaxine XR  150 mg Oral Q  breakfast    Infusions: . milrinone 0.125 mcg/kg/min (07/21/15 0625)    PRN Medications: sodium chloride, acetaminophen, albuterol, hydrOXYzine, magnesium hydroxide, ondansetron (ZOFRAN) IV, sodium chloride flush   Assessment:   1.  Cardiogenic shock 2. Acute on chronic systolic HF due to NICM EF 20% 3. Non-obstructive CAD 4. COPD with ongoing tobacco use 5. H/o breast CA treated with Adriamycin 6. Recent resection of leiomyosarcoma in R chest (margins +) 7. Carotid stenosis 8. DM2 9. Substance Abuse- UDS + cocaine  10. Hypokalemia  11. NSVT  Plan/Discussion:    Today CO-OX 65.4% on milrinone 0.172mcg.  Stop milrinone and recheck this afternoon.    Volume status up a little. Continue lasix 20 mg daily. Will continue digoxin and spiro. Continue entresto 24-26 mg twice a day. No b-blocker yet due to low output.   Renal function stable. Dig level 0.2 this am.    With NSVT will keep K+ >= 4.0 and Mg >= 2.0. Stable today with addition of lasix yesterday.   Has had long history of drug abuse. + UDS cocaine. Consult SW for substance abuse. She is considering rehab, but not directly after hospital stay.   Also need further attention to recent leiomyosarcoma resection with + margins if/when cardiac situation improved.   Metformin on hold due to cardiogenic shock. Increase glipizide. Cover with SSI.   Palliative care consult appreciated.    Length of Stay: 7  Shirley Friar PA-C  07/21/2015, 8:32 AM  Advanced Heart Failure Team Pager [phone] (M-F; 7a - 4p)  Please contact [facility] for night-coverage after hours (4p -7a ) and weekends on [email or web address]  Patient seen and examined with Oda Kilts, PA-C. We discussed all aspects of the encounter. I agree with the assessment and plan as stated above.   Co-ox and CVP ok. Will stop milrinone. Continue entresto. Possibly can titrate it tomorrow. No b-blocker yet with shock. NSVT improved with supplementing electrolytes.  Continue glipizide for DM2.   Ambulate.   Bensimhon, Daniel,MD 3:54 PM

## 2015-07-21 NOTE — Care Management Important Message (Signed)
Important Message  Patient Details  Name: Emily [name] MRN: [id number] Date of Birth: 1960-05-01   Medicare Important Message Given:  Yes    Loann Quill 07/21/2015, 11:08 AM

## 2015-07-21 NOTE — Progress Notes (Signed)
Pt is up walking independently. Looks good. Will f/u as time allows. Yves Dill CES, ACSM 2:24 PM 07/21/2015

## 2015-07-21 NOTE — Progress Notes (Signed)
Inpatient Diabetes Program Recommendations  AACE/ADA: New Consensus Statement on Inpatient Glycemic Control (2015)  Target Ranges:  Prepandial:   less than 140 mg/dL      Peak postprandial:   less than 180 mg/dL (1-2 hours)      Critically ill patients:  140 - 180 mg/dL   Review of Glycemic Control  Results for KALITA, NASUTI (MRN [id number]) as of 07/21/2015 15:44  Ref. Range 07/20/2015 11:20 07/20/2015 16:15 07/20/2015 21:13 07/21/2015 08:14 07/21/2015 12:30  Glucose-Capillary Latest Ref Range: 65-99 mg/dL 275 (H) 239 (H) 254 (H) 239 (H) 271 (H)   Needs insulin adjustment.  Inpatient Diabetes Program Recommendations:    Add Lantus 12 units QHS Add Novolog 3 units tidwc for meal coverage insulin. HgbA1C to assess glycemic control prior to admission.  Will continue to follow. Thank you. Lorenda Peck, RD, LDN, CDE Inpatient Diabetes Coordinator [phone]

## 2015-07-21 NOTE — Clinical Social Work Note (Signed)
Clinical Social Work Assessment  Patient Details  Name: Emily [name] MRN: [id number] Date of Birth: [birth year]  Date of referral:  07/21/15               Reason for consult:  Substance Use/ETOH Abuse                Permission sought to share information with:    Permission granted to share information::  No  Name::        Agency::     Relationship::     Contact Information:     Housing/Transportation Living arrangements for the past 2 months:  Single Family Home Source of Information:  Patient Patient Interpreter Needed:  None Criminal Activity/Legal Involvement Pertinent to Current Situation/Hospitalization:  No - Comment as needed Significant Relationships:  Parents, Other Family Members, Siblings Lives with:  Parents Do you feel safe going back to the place where you live?  Yes Need for family participation in patient care:  No (Coment)  Care giving concerns:  Patient reports no care giving concerns at this time.   Social Worker assessment / plan:  Patient is 55 YO Caucasian female with a history of polysubstance abuse. CSW engaged with Patient at her bedside. CSW introduced self, role of CSW, and addressed substance abuse concerns. Patient reports that she has been abusing substances on and off for 20+ years. Patient reports that she just moved to [facility] from [place], [place] about two weeks ago with the intentions of maintaining her sobriety. Patient reports that she has told her mother and brother that she uses but prefers not to talk about it with them. Patient reports that her drug of choice is cocaine but she has used prescription pills and alcohol as well. Patient expressed her desire to obtain and maintain her sobriety. Patient reports that she does not feel that she needs residential treatment at this time but is very interested in the Chemical Dependency IOPs and Outpatient substance abuse treatment centers CSW provided. Patient reports that Narcotics Anonymous has  worked well for her in the past. Patient expressed a strong desire to "clean up". She reports that this hospitalization and her various health issues have "scared me from wanting to use anything again". Patient showed CSW a self help "Just for Today" Narcotics Anonymous app on her phone that she reads daily. Patient is in agreement to follow up with Chemical Dependency IOPs and Outpatient substance abuse treatment centers CSW provided (Substance Abuse resources left with Patient).   Employment status:  Disabled (Comment on whether or not currently receiving Disability) Insurance information:  Managed Medicare PT Recommendations:  Not assessed at this time Information / Referral to community resources:  Residential Substance Abuse Treatment Options, Outpatient Substance Abuse Treatment Options, Outpatient Psychiatric Care (Comment Required)  Patient/Family's Response to care: Patient very appreciative of care received at this time.   Patient/Family's Understanding of and Emotional Response to Diagnosis, Current Treatment, and Prognosis:  Patient verbalized understanding of diagnosis, current treatment, and prognosis. Patient seems motivated to obtain and maintain sobriety.   Emotional Assessment Appearance:  Appears older than stated age Attitude/Demeanor/Rapport:   (Cooperative; Engaging; Pleasant) Affect (typically observed):  Appropriate, Calm, Hopeful, Accepting, Pleasant Orientation:  Oriented to Self, Oriented to Place, Oriented to  Time, Oriented to Situation Alcohol / Substance use:  Illicit Drugs Psych involvement (Current and /or in the community):  No (Comment)  Discharge Needs  Concerns to be addressed:  Substance Abuse Concerns Readmission within the last 30  days:  No Current discharge risk:  None Barriers to Discharge:  No Barriers Identified   Judeth Horn, LCSW 07/21/2015, 12:46 PM

## 2015-07-22 DIAGNOSIS — F329 Major depressive disorder, single episode, unspecified: Secondary | ICD-10-CM

## 2015-07-22 LAB — CARBOXYHEMOGLOBIN
Carboxyhemoglobin: 1.4 % (ref 0.5–1.5)
METHEMOGLOBIN: 0.8 % (ref 0.0–1.5)
O2 SAT: 54.7 %
TOTAL HEMOGLOBIN: 13.1 g/dL (ref 12.0–16.0)

## 2015-07-22 LAB — GLUCOSE, CAPILLARY
GLUCOSE-CAPILLARY: 227 mg/dL — AB (ref 65–99)
GLUCOSE-CAPILLARY: 189 mg/dL — AB (ref 65–99)
Glucose-Capillary: 183 mg/dL — ABNORMAL HIGH (ref 65–99)
Glucose-Capillary: 261 mg/dL — ABNORMAL HIGH (ref 65–99)

## 2015-07-22 LAB — BASIC METABOLIC PANEL
Anion gap: 7 (ref 5–15)
BUN: 13 mg/dL (ref 6–20)
CO2: 31 mmol/L (ref 22–32)
CREATININE: 0.68 mg/dL (ref 0.44–1.00)
Calcium: 10 mg/dL (ref 8.9–10.3)
Chloride: 99 mmol/L — ABNORMAL LOW (ref 101–111)
GFR calc Af Amer: >60 mL/min (ref >60)
GLUCOSE: 193 mg/dL — AB (ref 65–99)
POTASSIUM: 4.4 mmol/L (ref 3.5–5.1)
Sodium: 137 mmol/L (ref 135–145)

## 2015-07-22 MED ORDER — SACUBITRIL-VALSARTAN 49-51 MG PO TABS
1.0000 | ORAL_TABLET | Freq: Two times a day (BID) | ORAL | Status: DC
  Administered 2015-07-22 – 2015-07-23 (×4): 1 via ORAL
  Filled 2015-07-22 (×3): qty 1

## 2015-07-22 MED ORDER — METFORMIN HCL 500 MG PO TABS
1000.0000 mg | ORAL_TABLET | Freq: Two times a day (BID) | ORAL | Status: DC
  Administered 2015-07-23: 1000 mg via ORAL
  Filled 2015-07-22: qty 2

## 2015-07-22 NOTE — Progress Notes (Signed)
Daily Progress Note   Patient Name: Emily [name]       Date: 07/22/2015 DOB: [birth year]  Age: 55 y.o. MRN#: [id number] Attending Physician: Jolaine Artist, MD Primary Care Physician: [name], DO Admit Date: 07/14/2015  Reason for Consultation/Follow-up: Establishing goals of care and Psychosocial/spiritual support  Subjective:  - continued conversation regarding diagnosis, prognosis, trajectory of CHF specifically to medication, diet and life style compliance ans self responsibility  - discussed with Lianette her intention of seeking help/assistance with substance abuse  - discussed importance of seeking psychiatric evaluation for long h/o depression, likely bi-polar diagnosis which is under treated.  Friday is going to schedule appointment if she has difficulty she will contact me for assistance.    Length of Stay: 8  Current Medications: Scheduled Meds:  . aspirin EC  81 mg Oral Daily  . atorvastatin  20 mg Oral q1800  . digoxin  0.125 mg Oral Daily  . enoxaparin (LOVENOX) injection  40 mg Subcutaneous Q24H  . furosemide  20 mg Oral Daily  . gabapentin  800 mg Oral TID  . insulin aspart  0-15 Units Subcutaneous TID WC  . insulin aspart  0-5 Units Subcutaneous QHS  . letrozole  2.5 mg Oral q morning - 10a  . LORazepam  2 mg Oral QHS  . [START ON 07/23/2015] metFORMIN  1,000 mg Oral BID WC  . nicotine  21 mg Transdermal Daily  . sacubitril-valsartan  1 tablet Oral BID  . sodium chloride flush  10-40 mL Intracatheter Q12H  . spironolactone  25 mg Oral Daily  . venlafaxine XR  150 mg Oral Q breakfast    Continuous Infusions:    PRN Meds: sodium chloride, acetaminophen, albuterol, hydrOXYzine, magnesium hydroxide, ondansetron (ZOFRAN) IV, sodium chloride  flush  Physical Exam  Constitutional: She appears well-developed. She is cooperative.  Cardiovascular: Normal rate, regular rhythm and normal heart sounds.   Pulmonary/Chest: Effort normal and breath sounds normal.  Neurological: She is alert.  Skin: Skin is warm and dry.            Vital Signs: BP 107/94 mmHg  Pulse 104  Temp(Src) 97.8 F (36.6 C) (Oral)  Resp 19  Ht 5\' 5"  (1.651 m)  Wt 71.079 kg (156 lb 11.2 oz)  BMI 26.08 kg/m2  SpO2 95% SpO2: SpO2:  95 % O2 Device: O2 Device: Not Delivered O2 Flow Rate: O2 Flow Rate (L/min): 2 L/min  Intake/output summary:   Intake/Output Summary (Last 24 hours) at 07/22/15 1047 Last data filed at 07/22/15 0500  Gross per 24 hour  Intake    600 ml  Output    450 ml  Net    150 ml   LBM: Last BM Date: 07/18/15 Baseline Weight: Weight: 71.668 kg (158 lb) Most recent weight: Weight: 71.079 kg (156 lb 11.2 oz)       Palliative Assessment/Data: 60-70%    Flowsheet Rows        Most Recent Value   Intake Tab    Referral Department  Cardiology   Unit at Time of Referral  ICU   Palliative Care Primary Diagnosis  Cardiac   Date Notified  07/15/15   Palliative Care Type  New Palliative care   Reason for referral  Clarify Goals of Care   Date of Admission  07/14/15   Date first seen by Palliative Care  07/15/15   # of days Palliative referral response time  0 Day(s)   # of days IP prior to Palliative referral  1   Clinical Assessment    Psychosocial & Spiritual Assessment    Palliative Care Outcomes       Patient Active Problem List   Diagnosis Date Noted  . DNR (do not resuscitate) discussion   . Palliative care encounter   . Acute on chronic systolic (congestive) heart failure ([place]) 07/11/2015  . Wheeze 10/02/2014  . History of breast cancer 10/02/2014  . Sciatica of left side 09/30/2014  . Vitamin D deficiency 02/24/2014  . Bullous rash 12/08/2013  . Carotid stenosis 11/12/2013  . Chronic SI joint pain 10/27/2013  .  Raynaud's phenomenon 10/27/2013  . Skin bulla 10/27/2013  . Dizzy 06/09/2013  . Preventative health care 04/29/2013  . History of osteoporosis 04/29/2013  . Neuropathic pain 04/08/2013  . Choledocholithiasis 12/19/2012  . Smoker 12/11/2012  . Psoriasis 12/03/2012  . Fatty liver disease, nonalcoholic [id number]  . CAD (coronary artery disease) 11/12/2012  . Chronic systolic CHF (congestive heart failure) ([place]) 11/12/2012  . Cardiogenic shock ([place]) 10/30/2012  . GERD (gastroesophageal reflux disease)   . Skin thickening   . Iron deficiency anemia   . Dyslipidemia   . Insomnia   . DM (diabetes mellitus), type 2, uncontrolled ([place]) 01/01/2012  . History of acute pancreatitis 12/31/2011  . ADENOCARCINOMA, BREAST 12/01/2008    Palliative Care Assessment & Plan   Patient Profile:  55 y.o. female admitted on 07/14/2015 ER + R breast cancer s/p R lumpectomy, DM2, cholelithiasis s/p cholecystectomy (02/2013), COPD and systolic CHF due to nonischemic cardiomyopathy. Patient was admitted in 09/2012 with acute pulmonary edema and cardiogenic shock. LHC showed moderate nonobstructive CAD (40-50% mLAD, 50-70% LCx, 50% PDA). She was started on milrinone and diuresed. Cause of cardiomyopathy suspected to be Adriamycin toxicity. EF recovered to 45-50% in 4/15.   Recently had surgery to remove R chest leiomyosarcoma. Margins not clear so needs larger resection. Her oncologist is Dr Whitney Muse in [place]  Evaluated in office last week with recurrent HF symptoms - Class IIIb-IV. ECHO EF 20%, admitted and stabilized, close to ready for discharge   Recommendations/Plan:   Once medically stable dc home with mother locally, pursue substance abuse support.  Encouraged thorough psychiatric evaluation for diagnosis and medication adjustmetns.  She  is encuoraged to call with needs   Goals of Care and Additional Recommendations:  Patient is open to all offered and available medical interventions  to prolong life    Code Status:     Code Status Orders        Start     Ordered   07/14/15 1747  Full code   Continuous     07/14/15 1746    Code Status History    Date Active Date Inactive Code Status Order ID Comments User Context   03/06/2013 11:04 AM 03/09/2013  4:47 PM Full Code [id number]  Rolm Bookbinder, MD Inpatient   12/19/2012  4:17 PM 12/22/2012  5:29 PM Full Code [id number]  Allie Bossier, MD Inpatient   10/26/2012  8:29 PM 11/05/2012  2:11 PM Full Code [id number]  Janece Canterbury, MD Inpatient   12/31/2011  4:22 PM 01/02/2012  4:26 PM Full Code [id number]  Samuella Cota, MD Inpatient      Strongly encourged    Discharge Planning:   Likely home to mother's home with home health services   Thank you for allowing the Palliative Medicine Team to assist in the care of this patient.   Time In: 0900 Time Out: 0935 Total Time 35 min Prolonged Time Billed  no       Greater than 50%  of this time was spent counseling and coordinating care related to the above assessment and plan.  Wadie Lessen, NP  Please contact Palliative Medicine Team phone at [phone] for questions and concerns.   Discussed with Jannet Mantis NP

## 2015-07-22 NOTE — Progress Notes (Signed)
RN sts pt has walked numerous times in hall today. She is now asleep. Will allow to rest. Yves Dill CES, ACSM 2:25 PM 07/22/2015

## 2015-07-22 NOTE — Progress Notes (Signed)
Advance Directive has been completed. Please page chaplain with there anything else we could do.   Thanks.

## 2015-07-22 NOTE — Progress Notes (Signed)
Advanced Heart Failure Rounding Note   Subjective:     Admitted with cardiogenic shock. EF 15%. RV down. + Cocaine on admit. Has been using over the last few weeks.   Yesterday milrinone was stopped. Walked 12 times around the unit.   Denies SOB. Wants to go home   Objective:   Weight Range:  Vital Signs:   Temp:  [97.1 F (36.2 C)-98.4 F (36.9 C)] 97.8 F (36.6 C) (05/25 0738) Pulse Rate:  [104] 104 (05/24 0819) Resp:  [19-22] 19 (05/25 0738) BP: (103-134)/(58-94) 107/94 mmHg (05/25 0738) SpO2:  [92 %-97 %] 95 % (05/25 0738) Weight:  [156 lb 11.2 oz (71.079 kg)] 156 lb 11.2 oz (71.079 kg) (05/25 0414) Last BM Date: 07/18/15  Weight change: Filed Weights   07/20/15 0322 07/21/15 0330 07/22/15 0414  Weight: 156 lb 6.4 oz (70.943 kg) 157 lb 6.5 oz (71.4 kg) 156 lb 11.2 oz (71.079 kg)    Intake/Output:   Intake/Output Summary (Last 24 hours) at 07/22/15 0808 Last data filed at 07/22/15 0500  Gross per 24 hour  Intake 845.39 ml  Output   2450 ml  Net -1604.61 ml     Physical Exam CVP 8 CVP 7-8 General: No resp difficulty. Sitting in the chair. HEENT: normal Neck: supple. JVP  7-8. Carotids 2+ bilat; no bruits. No lymphadenopathy or thyromegaly appreciated. Incisional scar noted with small nodule on upper outer portion.  Cor: PMI nondisplaced. Tachy Regular +s3 Lungs: clear Abdomen: soft, nontender, nondistended. No hepatosplenomegaly. No bruits or masses. Good bowel sounds. Extremities: no cyanosis, clubbing, rash, edema Neuro: alert & orientedx3, cranial nerves grossly intact. moves all 4 extremities w/o difficulty. Affect pleasant  Telemetry: Sinus Tach 90-100s   Labs: Basic Metabolic Panel:  Recent Labs Lab 07/16/15 0901  07/17/15 2316  07/18/15 1342 07/19/15 0430 07/20/15 0440 07/21/15 0348 07/22/15 0450  NA 138  < > 133*  < > 131* 133* 137 137 137  K 4.0  < > 4.2  < > 3.9 3.8 4.1 4.1 4.4  CL 95*  < > 94*  < > 94* 96* 101 98* 99*  CO2 32   < > 30  < > 29 30 28  32 31  GLUCOSE 258*  < > 271*  < > 396* 287* 222* 260* 193*  BUN 18  < > 24*  < > 17 17 15 12 13   CREATININE 0.79  < > 0.90  < > 0.87 0.75 0.58 0.59 0.68  CALCIUM 8.5*  < > 8.8*  < > 8.8* 8.8* 8.9 9.6 10.0  MG 1.9  --  1.9  --  2.0  --   --   --   --   < > = values in this interval not displayed.  Liver Function Tests: No results for input(s): AST, ALT, ALKPHOS, BILITOT, PROT, ALBUMIN in the last 168 hours. No results for input(s): LIPASE, AMYLASE in the last 168 hours. No results for input(s): AMMONIA in the last 168 hours.  CBC: No results for input(s): WBC, NEUTROABS, HGB, HCT, MCV, PLT in the last 168 hours.  Cardiac Enzymes: No results for input(s): CKTOTAL, CKMB, CKMBINDEX, TROPONINI in the last 168 hours.  BNP: BNP (last 3 results)  Recent Labs  07/08/15 1545  BNP 331.2*    ProBNP (last 3 results) No results for input(s): PROBNP in the last 8760 hours.    Other results:  Imaging: No results found.   Medications:     Scheduled Medications: .  aspirin EC  81 mg Oral Daily  . atorvastatin  20 mg Oral q1800  . digoxin  0.125 mg Oral Daily  . enoxaparin (LOVENOX) injection  40 mg Subcutaneous Q24H  . furosemide  20 mg Oral Daily  . gabapentin  800 mg Oral TID  . glipiZIDE  10 mg Oral QAC breakfast  . insulin aspart  0-15 Units Subcutaneous TID WC  . insulin aspart  0-5 Units Subcutaneous QHS  . letrozole  2.5 mg Oral q morning - 10a  . LORazepam  2 mg Oral QHS  . nicotine  21 mg Transdermal Daily  . potassium chloride SA  40 mEq Oral Daily  . sacubitril-valsartan  1 tablet Oral BID  . sodium chloride flush  10-40 mL Intracatheter Q12H  . spironolactone  25 mg Oral Daily  . venlafaxine XR  150 mg Oral Q breakfast    Infusions:    PRN Medications: sodium chloride, acetaminophen, albuterol, hydrOXYzine, magnesium hydroxide, ondansetron (ZOFRAN) IV, sodium chloride flush   Assessment:   1.  Cardiogenic shock 2. Acute on chronic  systolic HF due to NICM EF 20% 3. Non-obstructive CAD 4. COPD with ongoing tobacco use 5. H/o breast CA treated with Adriamycin 6. Recent resection of leiomyosarcoma in R chest (margins +) 7. Carotid stenosis 8. DM2 9. Substance Abuse- UDS + cocaine  10. Hypokalemia  11. NSVT  Plan/Discussion:    Today CO-OX 55% off milrinone.   Repeat in am.    CVP 8. Continue lasix 20 mg daily. Will continue digoxin and spiro. Dig level 0.2. Increase entresto 49-51 mg twice a day. No b-blocker yet due to low output.   Renal function stable. Dig level 0.2 this am.    With NSVT will keep K+ >= 4.0 and Mg >= 2.0. Stable today.    Has had long history of drug abuse. + UDS cocaine. Consult SW for substance abuse. She is considering rehab, but not directly after hospital stay.   Also need further attention to recent leiomyosarcoma resection with + margins if/when cardiac situation improved.    Cover with SSI. Restart metformin tomorrow. Already had glipizide today  Palliative care consult appreciated.    Disposition: Home tomorrow  Length of Stay: [place]  NP-C  07/22/2015, 8:08 AM  Advanced Heart Failure Team Pager [phone] (M-F; 7a - 4p)  Please contact [facility] for night-coverage after hours (4p -7a ) and weekends on [email or web address]  Patient seen and examined with Darrick Grinder, NP. We discussed all aspects of the encounter. I agree with the assessment and plan as stated above.   Feels ok but co-ox dropping off milrinone. CVP 7-8 range. Will titrate Entresto and BP tolerates. Watch co-ox and CVP. Possibly home tomorrow. Restart metformin.   Bensimhon, Daniel,MD 9:02 AM

## 2015-07-23 DIAGNOSIS — F32A Depression, unspecified: Secondary | ICD-10-CM | POA: Insufficient documentation

## 2015-07-23 DIAGNOSIS — F329 Major depressive disorder, single episode, unspecified: Secondary | ICD-10-CM | POA: Insufficient documentation

## 2015-07-23 LAB — BASIC METABOLIC PANEL
ANION GAP: 5 (ref 5–15)
BUN: 13 mg/dL (ref 6–20)
CHLORIDE: 100 mmol/L — AB (ref 101–111)
CO2: 33 mmol/L — AB (ref 22–32)
Calcium: 9.2 mg/dL (ref 8.9–10.3)
Creatinine, Ser: 0.86 mg/dL (ref 0.44–1.00)
GFR calc Af Amer: >60 mL/min (ref >60)
GLUCOSE: 212 mg/dL — AB (ref 65–99)
POTASSIUM: 4.4 mmol/L (ref 3.5–5.1)
Sodium: 138 mmol/L (ref 135–145)

## 2015-07-23 LAB — CARBOXYHEMOGLOBIN
Carboxyhemoglobin: 0.9 % (ref 0.5–1.5)
METHEMOGLOBIN: 0.7 % (ref 0.0–1.5)
O2 Saturation: 52.2 %
Total hemoglobin: 12.9 g/dL (ref 12.0–16.0)

## 2015-07-23 LAB — GLUCOSE, CAPILLARY: Glucose-Capillary: 149 mg/dL — ABNORMAL HIGH (ref 65–99)

## 2015-07-23 MED ORDER — SPIRONOLACTONE 25 MG PO TABS: 25.0000 mg | ORAL_TABLET | Freq: Every day | ORAL | Status: DC

## 2015-07-23 MED ORDER — FUROSEMIDE 40 MG PO TABS: 40.0000 mg | ORAL_TABLET | Freq: Every day | ORAL | Status: DC

## 2015-07-23 MED ORDER — FUROSEMIDE 10 MG/ML IJ SOLN
40.0000 mg | Freq: Once | INTRAMUSCULAR | Status: AC
  Administered 2015-07-23: 40 mg via INTRAVENOUS
  Filled 2015-07-23: qty 4

## 2015-07-23 MED ORDER — SACUBITRIL-VALSARTAN 49-51 MG PO TABS: 1.0000 | ORAL_TABLET | Freq: Two times a day (BID) | ORAL | Status: DC

## 2015-07-23 NOTE — Progress Notes (Signed)
Transported to home with Mother in w/c with hospital volunteer assistance.  Patient awake, alert and oriented at time of discharge.  PICC d/c'd by IV Team with CDI dressing at site.  Discharge instructions given with all questions and concerns addressed and answered.  Clothing and cellphone with patient at time of discharge.

## 2015-07-23 NOTE — Discharge Summary (Signed)
Advanced Heart Failure Team  Discharge Summary   Patient ID: Emily [name] MRN: [id number], DOB/AGE: 1960/06/30 55 y.o. Admit date: 07/14/2015 D/C date:     07/23/2015   Primary Discharge Diagnoses:  1. Cardiogenic shock 2. Acute on chronic systolic HF due to NICM EF 20% 3. Non-obstructive CAD 4. COPD with ongoing tobacco use 5. H/o breast CA treated with Adriamycin 6. Recent resection of leiomyosarcoma in R chest (margins +) 7. Carotid stenosis 8. DM2 9. Substance Abuse- UDS + cocaine  10. Hypokalemia  11. NSVT  Hospital Course:  Emily [name] is a 55 yo woman with a history of ER + R breast cancer s/p R lumpectomy, DM2, cholelithiasis s/p cholecystectomy (02/2013), COPD and systolic CHF due to nonischemic cardiomyopathy (?chemo induced) . Most recent admit  09/2012 with acute pulmonary edema and cardiogenic shock.required short term milrinone.  LHC showed moderate nonobstructive CAD (40-50% mLAD, 50-70% LCx, 50% PDA).   Admitted post cath with cardiogenic shock and volume overload. Started on milrinone 0.25 mcg but had low mixed venous saturations so milrinone was increased to 0.5 mcg. Once fully diuresed milrinone was weaned off. On the day of discharge mixed venous saturation was low 52% . She is not a candidate for home milrinone with cocaine noted in her urine.Renal function was stable on discharge.    Overall she diuresed 4 pounds. She was not placed on bb due to cardiogenic shock. She was able to tolerate entresto 49-51 mg twice a day, spiro, lasix, and dig.   Due to + cocaine on admit she received SW consult for possible rehab. She is consideing rehab. Follow up in the HF clinic next week. She was instructed to the HF clinic if she develops increased dyspnea and or fatigue.    RHC 07/14/15 Ao = 106/72 (87) LV = 108/28 (36) RA = 22 RV = 60/14/25 PA = 67/22 (51) PCW = 31 Fick cardiac output/index = 2.8/1.6 SVR = 1823 PVR = 7.0 WU FA sat = 90% PA sat = 34%,  42% Assessment: 1. Mild non-obstructive CAD 2. Severe NICM with EF 25% by echo 3. Cardiogenic shock physiology with markedly elevated biventricular filling pressures   Discharge Weight : 156 pounds  Discharge Vitals: Blood pressure 98/72, pulse 88, temperature 98.1 F (36.7 C), temperature source Oral, resp. rate 14, height 5\' 5"  (1.651 m), weight 156 lb 3.2 oz (70.852 kg), SpO2 97 %.  Labs: Lab Results  Component Value Date   WBC 12.1* 07/15/2015   HGB 11.3* 07/15/2015   HCT 35.4* 07/15/2015   MCV 89.4 07/15/2015   PLT 203 07/15/2015    Recent Labs Lab 07/23/15 0454  NA 138  K 4.4  CL 100*  CO2 33*  BUN 13  CREATININE 0.86  CALCIUM 9.2  GLUCOSE 212*   Lab Results  Component Value Date   CHOL 120* 09/30/2014   HDL 67 09/30/2014   LDLCALC 37 09/30/2014   TRIG 79 09/30/2014   BNP (last 3 results)  Recent Labs  07/08/15 1545  BNP 331.2*    ProBNP (last 3 results) No results for input(s): PROBNP in the last 8760 hours.   Diagnostic Studies/Procedures   No results found.  Discharge Medications     Medication List    STOP taking these medications        carvedilol 6.25 MG tablet  Commonly known as:  COREG     ibuprofen 200 MG tablet  Commonly known as:  ADVIL,MOTRIN     sacubitril-valsartan 24-26  MG  Commonly known as:  ENTRESTO  Replaced by:  sacubitril-valsartan 49-51 MG      TAKE these medications        albuterol 108 (90 Base) MCG/ACT inhaler  Commonly known as:  PROVENTIL HFA;VENTOLIN HFA  Inhale 2 puffs into the lungs every 6 (six) hours as needed for wheezing or shortness of breath.     aspirin EC 81 MG tablet  Take 1 tablet (81 mg total) by mouth daily.     atorvastatin 20 MG tablet  Commonly known as:  LIPITOR  Take 1 tablet (20 mg total) by mouth daily.     digoxin 0.125 MG tablet  Commonly known as:  LANOXIN  Take 1 tablet (0.125 mg total) by mouth daily.     furosemide 40 MG tablet  Commonly known as:  LASIX  Take 1  tablet (40 mg total) by mouth daily.     gabapentin 400 MG capsule  Commonly known as:  NEURONTIN  Take 2 capsules (800 mg total) by mouth 3 (three) times daily.     glucose blood test strip  Test bid.     letrozole 2.5 MG tablet  Commonly known as:  FEMARA  Take 1 tablet (2.5 mg total) by mouth every morning.     loperamide 2 MG capsule  Commonly known as:  IMODIUM  Take 1 capsule (2 mg total) by mouth daily as needed for diarrhea or loose stools.     LORazepam 2 MG tablet  Commonly known as:  ATIVAN  Take 1 tablet (2 mg total) by mouth at bedtime.     metFORMIN 1000 MG tablet  Commonly known as:  GLUCOPHAGE  Take 1 tablet (1,000 mg total) by mouth 2 (two) times daily with a meal.     sacubitril-valsartan 49-51 MG  Commonly known as:  ENTRESTO  Take 1 tablet by mouth 2 (two) times daily.     spironolactone 25 MG tablet  Commonly known as:  ALDACTONE  Take 1 tablet (25 mg total) by mouth daily.     venlafaxine XR 150 MG 24 hr capsule  Commonly known as:  EFFEXOR-XR  Take 150 mg by mouth daily with breakfast.     Vitamin D 2000 units Caps  Take 1 capsule by mouth daily.        Disposition   The patient will be discharged in stable condition to home.     Discharge Instructions    Diet - low sodium heart healthy    Complete by:  As directed      Heart Failure patients record your daily weight using the same scale at the same time of day    Complete by:  As directed      Increase activity slowly    Complete by:  As directed      PICC line removal    Complete by:  As directed           Follow-up Information    Follow up with Glori Bickers, MD On 07/29/2015.   Specialty:  Cardiology   Why:  Garage Code 0020 at 10:40    Contact information:   [address] [place] [address] [phone]         Duration of Discharge Encounter: Greater than 35 minutes   Signed, Darrick Grinder NP-C  07/23/2015, 8:00 AM   Patient seen and  examined with Darrick Grinder, NP. We discussed all aspects of the encounter. I agree with the assessment  and plan as stated above.   Co-ox coming back down and CVP slightly up but she feels well and is ambulating the unit without difficulty. On good oral meds.She is not ideal candidate for home inotropes with ongoing substance use.  Will let her go home today and see her in HF Clinic next week. Knows to call or come to ER over weekend if she is feeling worse.   Corneisha Alvi,MD 11:51 AM

## 2015-07-23 NOTE — Progress Notes (Signed)
Advanced Heart Failure Rounding Note   Subjective:     Admitted with cardiogenic shock. EF 15%. RV down. + Cocaine on admit. Has been using over the last few weeks.   Todays CO-OX 52%. Walked 24 times around the unit.   Denies SOB. Wants to go home   Objective:   Weight Range:  Vital Signs:   Temp:  [97.5 F (36.4 C)-98.1 F (36.7 C)] 98.1 F (36.7 C) (05/26 0318) Pulse Rate:  [78-101] 101 (05/25 2345) Resp:  [13-23] 14 (05/26 0400) BP: (93-101)/(63-75) 98/72 mmHg (05/26 0325) SpO2:  [95 %-98 %] 97 % (05/26 0325) Weight:  [156 lb 3.2 oz (70.852 kg)] 156 lb 3.2 oz (70.852 kg) (05/26 0400) Last BM Date: 07/21/15 (per patient )  Weight change: Filed Weights   07/21/15 0330 07/22/15 0414 07/23/15 0400  Weight: 157 lb 6.5 oz (71.4 kg) 156 lb 11.2 oz (71.079 kg) 156 lb 3.2 oz (70.852 kg)    Intake/Output:   Intake/Output Summary (Last 24 hours) at 07/23/15 0741 Last data filed at 07/23/15 0600  Gross per 24 hour  Intake    945 ml  Output   2350 ml  Net  -1405 ml     Physical Exam CVP ~9 CVP 7-8 General: No resp difficulty. In bed.  HEENT: normal Neck: supple. JVP  7-8. Carotids 2+ bilat; no bruits. No lymphadenopathy or thyromegaly appreciated. Incisional scar noted with small nodule on upper outer portion.  Cor: PMI nondisplaced. Tachy Regular +s3 Lungs: clear Abdomen: soft, nontender, nondistended. No hepatosplenomegaly. No bruits or masses. Good bowel sounds. Extremities: no cyanosis, clubbing, rash, edema Neuro: alert & orientedx3, cranial nerves grossly intact. moves all 4 extremities w/o difficulty. Affect pleasant  Telemetry: Sinus Tach 90s   Labs: Basic Metabolic Panel:  Recent Labs Lab 07/16/15 0901  07/17/15 2316  07/18/15 1342 07/19/15 0430 07/20/15 0440 07/21/15 0348 07/22/15 0450 07/23/15 0454  NA 138  < > 133*  < > 131* 133* 137 137 137 138  K 4.0  < > 4.2  < > 3.9 3.8 4.1 4.1 4.4 4.4  CL 95*  < > 94*  < > 94* 96* 101 98* 99* 100*    CO2 32  < > 30  < > 29 30 28  32 31 33*  GLUCOSE 258*  < > 271*  < > 396* 287* 222* 260* 193* 212*  BUN 18  < > 24*  < > 17 17 15 12 13 13   CREATININE 0.79  < > 0.90  < > 0.87 0.75 0.58 0.59 0.68 0.86  CALCIUM 8.5*  < > 8.8*  < > 8.8* 8.8* 8.9 9.6 10.0 9.2  MG 1.9  --  1.9  --  2.0  --   --   --   --   --   < > = values in this interval not displayed.  Liver Function Tests: No results for input(s): AST, ALT, ALKPHOS, BILITOT, PROT, ALBUMIN in the last 168 hours. No results for input(s): LIPASE, AMYLASE in the last 168 hours. No results for input(s): AMMONIA in the last 168 hours.  CBC: No results for input(s): WBC, NEUTROABS, HGB, HCT, MCV, PLT in the last 168 hours.  Cardiac Enzymes: No results for input(s): CKTOTAL, CKMB, CKMBINDEX, TROPONINI in the last 168 hours.  BNP: BNP (last 3 results)  Recent Labs  07/08/15 1545  BNP 331.2*    ProBNP (last 3 results) No results for input(s): PROBNP in the last 8760 hours.  Other results:  Imaging: No results found.   Medications:     Scheduled Medications: . aspirin EC  81 mg Oral Daily  . atorvastatin  20 mg Oral q1800  . digoxin  0.125 mg Oral Daily  . enoxaparin (LOVENOX) injection  40 mg Subcutaneous Q24H  . furosemide  20 mg Oral Daily  . gabapentin  800 mg Oral TID  . insulin aspart  0-15 Units Subcutaneous TID WC  . insulin aspart  0-5 Units Subcutaneous QHS  . letrozole  2.5 mg Oral q morning - 10a  . LORazepam  2 mg Oral QHS  . metFORMIN  1,000 mg Oral BID WC  . nicotine  21 mg Transdermal Daily  . sacubitril-valsartan  1 tablet Oral BID  . sodium chloride flush  10-40 mL Intracatheter Q12H  . spironolactone  25 mg Oral Daily  . venlafaxine XR  150 mg Oral Q breakfast    Infusions:    PRN Medications: sodium chloride, acetaminophen, albuterol, hydrOXYzine, magnesium hydroxide, ondansetron (ZOFRAN) IV, sodium chloride flush   Assessment:   1.  Cardiogenic shock 2. Acute on chronic systolic HF  due to NICM EF 20% 3. Non-obstructive CAD 4. COPD with ongoing tobacco use 5. H/o breast CA treated with Adriamycin 6. Recent resection of leiomyosarcoma in R chest (margins +) 7. Carotid stenosis 8. DM2 9. Substance Abuse- UDS + cocaine  10. Hypokalemia  11. NSVT  Plan/Discussion:    Today CO-OX 52% off milrinone. No HF symptoms. Watch closely.    CVP 9. Increase lasix to 40 mg po daily. Will give a dose of IV lasix before discharge. Will continue digoxin and spiro. Dig level 0.2. Continue entresto 49-51 mg twice a day. No b-blocker yet due to low output.   Dig level 0.2 this am.    With NSVT will keep K+ >= 4.0 and Mg >= 2.0. Stable today.    Has had long history of drug abuse. + UDS cocaine. Consult SW for substance abuse. She is considering rehab, but not directly after hospital stay.   Also need further attention to recent leiomyosarcoma resection with + margins if/when cardiac situation improved.    Cover with SSI. Restart metformin tomorrow. Already had glipizide today  Palliative care consult appreciated.    Disposition: Home today. She has follow up June 1st  Length of Stay: [place]  NP-C  07/23/2015, 7:41 AM  Advanced Heart Failure Team Pager [phone] (M-F; [place])  Please contact [facility] for night-coverage after hours (4p -7a ) and weekends on [email or web address]    Patient seen and examined with Darrick Grinder, NP. We discussed all aspects of the encounter. I agree with the assessment and plan as stated above.   Co-ox coming back down and CVP slightly up but she feels well and is ambulating the unit without difficulty. On good oral meds.She is not ideal candidate for home inotropes with ongoing substance use.  Will let her go home today and see her in HF Clinic next week. Knows to call or come to ER over weekend if she is feeling worse.   Jenine Krisher,MD 11:51 AM

## 2015-07-27 ENCOUNTER — Other Ambulatory Visit (HOSPITAL_COMMUNITY): Payer: Self-pay | Admitting: Oncology

## 2015-07-27 DIAGNOSIS — C50911 Malignant neoplasm of unspecified site of right female breast: Secondary | ICD-10-CM

## 2015-07-27 MED ORDER — LETROZOLE 2.5 MG PO TABS: 2.5000 mg | ORAL_TABLET | Freq: Every morning | ORAL | Status: DC

## 2015-07-29 ENCOUNTER — Ambulatory Visit (HOSPITAL_COMMUNITY)
Admission: RE | Admit: 2015-07-29 | Discharge: 2015-07-29 | Disposition: A | Discharge status: Home / Self Care | Payer: Medicare Other | Source: Ambulatory Visit | Attending: Internal Medicine | Admitting: Internal Medicine

## 2015-07-29 ENCOUNTER — Encounter (HOSPITAL_COMMUNITY): Payer: Self-pay | Admitting: Internal Medicine

## 2015-07-29 VITALS — BP 98/60 | HR 85 | Wt 154.8 lb

## 2015-07-29 DIAGNOSIS — Z7984 Long term (current) use of oral hypoglycemic drugs: Secondary | ICD-10-CM | POA: Diagnosis not present

## 2015-07-29 DIAGNOSIS — Z923 Personal history of irradiation: Secondary | ICD-10-CM | POA: Insufficient documentation

## 2015-07-29 DIAGNOSIS — I6523 Occlusion and stenosis of bilateral carotid arteries: Secondary | ICD-10-CM | POA: Diagnosis not present

## 2015-07-29 DIAGNOSIS — Z9221 Personal history of antineoplastic chemotherapy: Secondary | ICD-10-CM | POA: Insufficient documentation

## 2015-07-29 DIAGNOSIS — E785 Hyperlipidemia, unspecified: Secondary | ICD-10-CM | POA: Insufficient documentation

## 2015-07-29 DIAGNOSIS — C761 Malignant neoplasm of thorax: Secondary | ICD-10-CM | POA: Insufficient documentation

## 2015-07-29 DIAGNOSIS — F17200 Nicotine dependence, unspecified, uncomplicated: Secondary | ICD-10-CM | POA: Insufficient documentation

## 2015-07-29 DIAGNOSIS — Z79811 Long term (current) use of aromatase inhibitors: Secondary | ICD-10-CM | POA: Diagnosis not present

## 2015-07-29 DIAGNOSIS — Z7982 Long term (current) use of aspirin: Secondary | ICD-10-CM | POA: Diagnosis not present

## 2015-07-29 DIAGNOSIS — Z853 Personal history of malignant neoplasm of breast: Secondary | ICD-10-CM | POA: Diagnosis not present

## 2015-07-29 DIAGNOSIS — I428 Other cardiomyopathies: Secondary | ICD-10-CM | POA: Insufficient documentation

## 2015-07-29 DIAGNOSIS — R21 Rash and other nonspecific skin eruption: Secondary | ICD-10-CM | POA: Diagnosis not present

## 2015-07-29 DIAGNOSIS — I2510 Atherosclerotic heart disease of native coronary artery without angina pectoris: Secondary | ICD-10-CM | POA: Diagnosis not present

## 2015-07-29 DIAGNOSIS — I5022 Chronic systolic (congestive) heart failure: Secondary | ICD-10-CM | POA: Diagnosis not present

## 2015-07-29 DIAGNOSIS — Z79899 Other long term (current) drug therapy: Secondary | ICD-10-CM | POA: Insufficient documentation

## 2015-07-29 DIAGNOSIS — E119 Type 2 diabetes mellitus without complications: Secondary | ICD-10-CM | POA: Diagnosis not present

## 2015-07-29 DIAGNOSIS — J449 Chronic obstructive pulmonary disease, unspecified: Secondary | ICD-10-CM | POA: Insufficient documentation

## 2015-07-29 DIAGNOSIS — I110 Hypertensive heart disease with heart failure: Secondary | ICD-10-CM | POA: Insufficient documentation

## 2015-07-29 LAB — BASIC METABOLIC PANEL
Anion gap: 11 (ref 5–15)
BUN: 23 mg/dL — AB (ref 6–20)
CO2: 22 mmol/L (ref 22–32)
CREATININE: 0.75 mg/dL (ref 0.44–1.00)
Calcium: 9.4 mg/dL (ref 8.9–10.3)
Chloride: 103 mmol/L (ref 101–111)
GFR calc Af Amer: >60 mL/min (ref >60)
GLUCOSE: 133 mg/dL — AB (ref 65–99)
POTASSIUM: 4.1 mmol/L (ref 3.5–5.1)
SODIUM: 136 mmol/L (ref 135–145)

## 2015-07-29 LAB — DIGOXIN LEVEL: Digoxin Level: 0.6 ng/mL — ABNORMAL LOW (ref 0.8–2.0)

## 2015-07-29 LAB — BRAIN NATRIURETIC PEPTIDE: B Natriuretic Peptide: 138.7 pg/mL — ABNORMAL HIGH (ref 0.0–100.0)

## 2015-07-29 NOTE — Progress Notes (Signed)
Patient ID: Emily [name], female   DOB: 1960-04-26, 55 y.o.   MRN: [id number]  ADVANCED HF CLINIC NOTE  Patient ID: Emily [name], female   DOB: March 27, 1960, 55 y.o.   MRN: [id number] PCP: Dr Emily [name] Primary Cardiologist: Dr. Haroldine [name]  Emily [name] is a 55 yo woman with a history of ER + R breast cancer s/p R lumpectomy, DM2, cholelithiasis s/p cholecystectomy (02/2013), COPD and systolic CHF due to nonischemic cardiomyopathy.  Patient was admitted in 09/2012 with acute pulmonary edema and cardiogenic shock.  LHC showed moderate nonobstructive CAD (40-50% mLAD, 50-70% LCx, 50% PDA).  She was started on milrinone and diuresed.  Cause of cardiomyopathy suspected to be Adriamycin toxicity.    ECHO 10/27/12 EF 20-25% ECHO 01/09/13 EF 30-35% ECHO 06/05/13 EF 45-50% septum dysynnergic  12/05/12 Carotid Dopplers- R 39 % stenosis and L  40-59% stenosis  Recently had surgery to remove R chest leiomyosarcoma. Margins not clear so needs larger resection.  She moved back from [place], [place] in May 2017 for recurrent HF symptome. Found to have EF 10-15% with NYHA IIIB-IV symptoms. Was admitted. UDS + cocaine. Cath showed non-obstructive CAD with Cardiogenic shock initial PA sat 34%, 42%. Started on milrinone. Diuresed about 5 pounds. Milrinone weaned off and co-ox dropped down to 52% but was feeling ok. Weight on d/c was 156.   Follow-up: Overall doing ok but feels she just doesn't have much energy to do much. That said she can walk around the story without too much difficulty. No dyspnea or PND. Weight stable at 153#. Compliant with all meds. No orthopnea or PND. No dizziness.    Echo today EF 15-20% RV ok.    Cath 07/14/15 Ao = 106/72 (87) LV = 108/28 (36) RA = 22 RV = 60/14/25 PA = 67/22 (51) PCW = 31 Fick cardiac output/index = 2.8/1.6 SVR = 1823 PVR = 7.0 WU FA sat = 90% PA sat = 34%, 42%  Assessment: 1. Mild non-obstructive CAD 2. Severe NICM with EF 25% by echo 3. Cardiogenic shock  physiology with markedly elevated biventricular filling pressures Lexiscan 09/23/13: EF 50% normal perfusion Holter: SR occasional PVCs  Labs (9/14): digoxin 0.4, K 3.6, creatinine 0.62 Labs (9/15): K 4.4 creatinine 0.68 Labs (10/26):    K 3.7 Cr 0.67 Labs: (03/20/2013): K+ 4.6, Cr 0.73 Labs (09/25/13): K 4.2 Cr 0.9  PMH: 1. COPD: PFTs (1/14) with FEV1 68%.  Quit smoking 9/14.  2. Type II diabetes  3. HTN 4. Breast cancer: 2009, s/p Adriamycin/Cytoxan/Taxol chemo, lumpectomy and radiation.   5. Nonischemic cardiomyopathy: Possibly due to Adriamycin.  Echo (8/14) with EF 20-25%, global hypokinesis, moderate diastolic dysfunction, normal RV size and with mild to moderately decreased systolic function, moderate TR.   6. CAD: LHC (8/14) with nonobstructive CAD; 40-50% mLAD, 50-70% LCx, 50% PDA.  7. Hyperlipidemia  SH: Lives alone in [place].  Mother is next door.  Still smoking.   FH: No premature CAD.  No family history of cardiomyopathy.   ROS: All systems reviewed and negative except as per HPI.   Current Outpatient Prescriptions  Medication Sig Dispense Refill  . albuterol (PROVENTIL HFA;VENTOLIN HFA) 108 (90 BASE) MCG/ACT inhaler Inhale 2 puffs into the lungs every 6 (six) hours as needed for wheezing or shortness of breath. 1 Inhaler 0  . aspirin EC 81 MG tablet Take 1 tablet (81 mg total) by mouth daily. 90 tablet 3  . atorvastatin (LIPITOR) 20 MG tablet Take 1 tablet (20 mg total)  by mouth daily. 90 tablet 3  . Cholecalciferol (VITAMIN D) 2000 UNITS CAPS Take 1 capsule by mouth daily.    . digoxin (LANOXIN) 0.125 MG tablet Take 1 tablet (0.125 mg total) by mouth daily. 30 tablet 3  . furosemide (LASIX) 40 MG tablet Take 1 tablet (40 mg total) by mouth daily. 30 tablet 6  . gabapentin (NEURONTIN) 400 MG capsule Take 2 capsules (800 mg total) by mouth 3 (three) times daily. 180 capsule 2  . glucose blood test strip Test bid. 180 each 3  . letrozole (FEMARA) 2.5 MG tablet Take 1  tablet (2.5 mg total) by mouth every morning. 90 tablet 2  . loperamide (IMODIUM) 2 MG capsule Take 1 capsule (2 mg total) by mouth daily as needed for diarrhea or loose stools. 90 capsule 0  . LORazepam (ATIVAN) 2 MG tablet Take 1 tablet (2 mg total) by mouth at bedtime. 90 tablet 1  . metFORMIN (GLUCOPHAGE) 1000 MG tablet Take 1 tablet (1,000 mg total) by mouth 2 (two) times daily with a meal. 180 tablet 1  . sacubitril-valsartan (ENTRESTO) 49-51 MG Take 1 tablet by mouth 2 (two) times daily. 60 tablet 6  . spironolactone (ALDACTONE) 25 MG tablet Take 1 tablet (25 mg total) by mouth daily. 30 tablet 6  . venlafaxine XR (EFFEXOR-XR) 150 MG 24 hr capsule Take 150 mg by mouth daily with breakfast.     No current facility-administered medications for this encounter.    Filed Vitals:   07/29/15 1039  BP: 98/60  Pulse: 85  Weight: 154 lb 12 oz (70.194 kg)  SpO2: 97%    General: NAD Neck: JVP flat, no thyromegaly or thyroid nodule.  Lungs: Clear to auscultation bilaterally with normal respiratory effort. Decreased BS throughout.  CV: Laterally displaced PMI.  Heart regular S1/S2, no S3/S4, no murmur.  1+ edema.  No carotid bruit.  Normal pedal pulses Abdomen: Soft, nontender, no hepatosplenomegaly, no distention. R subclavian scar Skin: Intact without lesions or rashes.  Neurologic: Alert and oriented x 3.  Psych: Normal affect. Extremities: No clubbing or cyanosis. DPs 1+ bilateral  HEENT: Normal.   Assessment/Plan: 1. Chronic systolic CHF: NICM, likely Adriamycin toxicity,. EF had previously recovered but now way back down  20% - Recently admitted for cardiogenic shock 5/17. Required milrinone. Now off.  - Improved but NYHA III. Volume status ok  - Continue Digoxin 0.125mg  daily, Entresto 49/51, spiro 25, lasix 40 daily - Too tenuous for b-blocker at this point. - Continue current regimen. Check labs. Need to check with pharmD regarding Entresto price.  2. CAD: Moderate  nonobstructive disease. -Continue ASA and statin 3. COPD/tobacco use - has cut way back. Trying to quit with patch 4. Cocaine use- says she has quit  5. Breast Cancer: received adriamycin. Cancer free for > 5 years. 6. Carotid stenosis: asymptomatic. Will get yearly u/s. Continue asa and statin.  7. R chest leiomyosarcoma: -has had resection but margins were not clear. Will need oncology f/u. Previously has seen Dr. Whitney Muse for breast CA but would like to transfer her care to [facility] as it is closer. Will arrange. -currently her heart is too weak for invasive treatment.   Stephana Morell,MD 11:23 AM

## 2015-07-29 NOTE — Patient Instructions (Signed)
Routine lab work today. Will notify you of abnormal results, otherwise no news is good news!  Will refer you to Dr. Alen Blew with Oncology for subclavian tumor. [facility]  [place], [place] [address]  [phone]  Follow up 4 weeks with Dr. Haroldine Laws.  Do the following things EVERYDAY: 1) Weigh yourself in the morning before breakfast. Write it down and keep it in a log. 2) Take your medicines as prescribed 3) Eat low salt foods-Limit salt (sodium) to 2000 mg per day.  4) Stay as active as you can everyday 5) Limit all fluids for the day to less than 2 liters

## 2015-08-06 ENCOUNTER — Encounter: Payer: Self-pay | Admitting: Family Medicine

## 2015-08-06 ENCOUNTER — Ambulatory Visit (INDEPENDENT_AMBULATORY_CARE_PROVIDER_SITE_OTHER): Payer: Medicare Other | Admitting: Family Medicine

## 2015-08-06 VITALS — BP 139/80 | HR 89 | Temp 98.8°F | Resp 20 | Wt 152.8 lb

## 2015-08-06 DIAGNOSIS — E1165 Type 2 diabetes mellitus with hyperglycemia: Secondary | ICD-10-CM

## 2015-08-06 DIAGNOSIS — Z23 Encounter for immunization: Secondary | ICD-10-CM

## 2015-08-06 DIAGNOSIS — M792 Neuralgia and neuritis, unspecified: Secondary | ICD-10-CM | POA: Diagnosis not present

## 2015-08-06 DIAGNOSIS — IMO0001 Reserved for inherently not codable concepts without codable children: Secondary | ICD-10-CM

## 2015-08-06 LAB — POCT GLYCOSYLATED HEMOGLOBIN (HGB A1C): HEMOGLOBIN A1C: 7.5

## 2015-08-06 MED ORDER — METFORMIN HCL 1000 MG PO TABS: 1000.0000 mg | ORAL_TABLET | Freq: Two times a day (BID) | ORAL | Status: DC

## 2015-08-06 MED ORDER — GABAPENTIN 400 MG PO CAPS: 800.0000 mg | ORAL_CAPSULE | Freq: Three times a day (TID) | ORAL | Status: DC

## 2015-08-06 MED ORDER — GLUCOSE BLOOD VI STRP: ORAL_STRIP | Status: DC

## 2015-08-06 MED ORDER — GLIPIZIDE 5 MG PO TABS: 5.0000 mg | ORAL_TABLET | Freq: Two times a day (BID) | ORAL | Status: DC
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
## 2015-08-06 NOTE — Patient Instructions (Signed)
Diabetes follow up every 3 months.  Diabetes and Foot Care Diabetes may cause you to have problems because of poor blood supply (circulation) to your feet and legs. This may cause the skin on your feet to become thinner, break easier, and heal more slowly. Your skin may become dry, and the skin may peel and crack. You may also have nerve damage in your legs and feet causing decreased feeling in them. You may not notice minor injuries to your feet that could lead to infections or more serious problems. Taking care of your feet is one of the most important things you can do for yourself.  HOME CARE INSTRUCTIONS  Wear shoes at all times, even in the house. Do not go barefoot. Bare feet are easily injured.  Check your feet daily for blisters, cuts, and redness. If you cannot see the bottom of your feet, use a mirror or ask someone for help.  Wash your feet with warm water (do not use hot water) and mild soap. Then pat your feet and the areas between your toes until they are completely dry. Do not soak your feet as this can dry your skin.  Apply a moisturizing lotion or petroleum jelly (that does not contain alcohol and is unscented) to the skin on your feet and to dry, brittle toenails. Do not apply lotion between your toes.  Trim your toenails straight across. Do not dig under them or around the cuticle. File the edges of your nails with an emery board or nail file.  Do not cut corns or calluses or try to remove them with medicine.  Wear clean socks or stockings every day. Make sure they are not too tight. Do not wear knee-high stockings since they may decrease blood flow to your legs.  Wear shoes that fit properly and have enough cushioning. To break in new shoes, wear them for just a few hours a day. This prevents you from injuring your feet. Always look in your shoes before you put them on to be sure there are no objects inside.  Do not cross your legs. This may decrease the blood flow to your  feet.  If you find a minor scrape, cut, or break in the skin on your feet, keep it and the skin around it clean and dry. These areas may be cleansed with mild soap and water. Do not cleanse the area with peroxide, alcohol, or iodine.  When you remove an adhesive bandage, be sure not to damage the skin around it.  If you have a wound, look at it several times a day to make sure it is healing.  Do not use heating pads or hot water bottles. They may burn your skin. If you have lost feeling in your feet or legs, you may not know it is happening until it is too late.  Make sure your health care provider performs a complete foot exam at least annually or more often if you have foot problems. Report any cuts, sores, or bruises to your health care provider immediately. SEEK MEDICAL CARE IF:   You have an injury that is not healing.  You have cuts or breaks in the skin.  You have an ingrown nail.  You notice redness on your legs or feet.  You feel burning or tingling in your legs or feet.  You have pain or cramps in your legs and feet.  Your legs or feet are numb.  Your feet always feel cold. [place]  IF:   There is increasing redness, swelling, or pain in or around a wound.  There is a red line that goes up your leg.  Pus is coming from a wound.  You develop a fever or as directed by your health care provider.  You notice a bad smell coming from an ulcer or wound.   This information is not intended to replace advice given to you by your health care provider. Make sure you discuss any questions you have with your health care provider.   Document Released: 02/11/2000 Document Revised: 10/16/2012 Document Reviewed: 07/23/2012 Elsevier Interactive Patient Education [employer].

## 2015-08-09 ENCOUNTER — Telehealth (HOSPITAL_COMMUNITY): Payer: Self-pay | Admitting: Cardiology

## 2015-08-09 NOTE — Telephone Encounter (Signed)
Patient called with concerns regarding 5 lb weight gain Mild LE edema  Per vo andy tillery, pa Patient can take an additional lasix for 2-3 days   Pt aware and voiced understanding, advised to call back if she has to take additional tablet longer than 3 days

## 2015-08-10 ENCOUNTER — Other Ambulatory Visit: Payer: Self-pay | Admitting: Oncology

## 2015-08-10 ENCOUNTER — Telehealth: Payer: Self-pay | Admitting: Oncology

## 2015-08-10 DIAGNOSIS — Z1231 Encounter for screening mammogram for malignant neoplasm of breast: Secondary | ICD-10-CM

## 2015-08-10 NOTE — Telephone Encounter (Signed)
Pt aware of np appt on 08/19/15@1 :30 Dx Leiomyosarcoma Referring Dr. Haroldine Laws

## 2015-08-16 ENCOUNTER — Encounter: Payer: Self-pay | Admitting: Internal Medicine

## 2015-08-17 ENCOUNTER — Encounter: Payer: Self-pay | Admitting: Internal Medicine

## 2015-08-17 ENCOUNTER — Encounter: Payer: Self-pay | Admitting: Family Medicine

## 2015-08-19 ENCOUNTER — Ambulatory Visit (HOSPITAL_BASED_OUTPATIENT_CLINIC_OR_DEPARTMENT_OTHER): Payer: Medicare Other | Admitting: Oncology

## 2015-08-19 ENCOUNTER — Other Ambulatory Visit: Payer: Medicare Other

## 2015-08-19 VITALS — BP 129/85 | HR 114 | Temp 98.0°F | Resp 18 | Ht 65.0 in | Wt 154.9 lb

## 2015-08-19 DIAGNOSIS — C499 Malignant neoplasm of connective and soft tissue, unspecified: Secondary | ICD-10-CM

## 2015-08-19 DIAGNOSIS — C493 Malignant neoplasm of connective and soft tissue of thorax: Secondary | ICD-10-CM

## 2015-08-19 DIAGNOSIS — Z853 Personal history of malignant neoplasm of breast: Secondary | ICD-10-CM

## 2015-08-19 DIAGNOSIS — I255 Ischemic cardiomyopathy: Secondary | ICD-10-CM
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
## 2015-08-19 NOTE — Progress Notes (Signed)
Please see consult note.  

## 2015-08-20 ENCOUNTER — Telehealth: Payer: Self-pay | Admitting: Oncology

## 2015-08-20 NOTE — Telephone Encounter (Signed)
returned call and s.w. pt and r.s appt per pt request....pt ok and aware °

## 2015-08-24 LAB — HM DIABETES EYE EXAM

## 2015-09-01 ENCOUNTER — Encounter (HOSPITAL_COMMUNITY): Payer: Self-pay | Admitting: Internal Medicine

## 2015-09-01 ENCOUNTER — Telehealth (HOSPITAL_COMMUNITY): Payer: Self-pay | Admitting: *Deleted

## 2015-09-01 ENCOUNTER — Ambulatory Visit (INDEPENDENT_AMBULATORY_CARE_PROVIDER_SITE_OTHER): Payer: Medicare Other

## 2015-09-01 ENCOUNTER — Ambulatory Visit (HOSPITAL_COMMUNITY)
Admission: RE | Admit: 2015-09-01 | Discharge: 2015-09-01 | Disposition: A | Discharge status: Home / Self Care | Payer: Medicare Other | Source: Ambulatory Visit | Attending: Internal Medicine | Admitting: Internal Medicine

## 2015-09-01 VITALS — BP 124/76 | HR 96 | Wt 159.5 lb

## 2015-09-01 DIAGNOSIS — Z853 Personal history of malignant neoplasm of breast: Secondary | ICD-10-CM | POA: Insufficient documentation

## 2015-09-01 DIAGNOSIS — Z7984 Long term (current) use of oral hypoglycemic drugs: Secondary | ICD-10-CM | POA: Diagnosis not present

## 2015-09-01 DIAGNOSIS — Z79899 Other long term (current) drug therapy: Secondary | ICD-10-CM | POA: Insufficient documentation

## 2015-09-01 DIAGNOSIS — Z9221 Personal history of antineoplastic chemotherapy: Secondary | ICD-10-CM | POA: Insufficient documentation

## 2015-09-01 DIAGNOSIS — Z79811 Long term (current) use of aromatase inhibitors: Secondary | ICD-10-CM | POA: Insufficient documentation

## 2015-09-01 DIAGNOSIS — R002 Palpitations: Secondary | ICD-10-CM

## 2015-09-01 DIAGNOSIS — Z923 Personal history of irradiation: Secondary | ICD-10-CM | POA: Insufficient documentation

## 2015-09-01 DIAGNOSIS — J449 Chronic obstructive pulmonary disease, unspecified: Secondary | ICD-10-CM | POA: Diagnosis not present

## 2015-09-01 DIAGNOSIS — Z7982 Long term (current) use of aspirin: Secondary | ICD-10-CM | POA: Insufficient documentation

## 2015-09-01 DIAGNOSIS — I110 Hypertensive heart disease with heart failure: Secondary | ICD-10-CM | POA: Insufficient documentation

## 2015-09-01 DIAGNOSIS — F17200 Nicotine dependence, unspecified, uncomplicated: Secondary | ICD-10-CM | POA: Diagnosis not present

## 2015-09-01 DIAGNOSIS — E785 Hyperlipidemia, unspecified: Secondary | ICD-10-CM

## 2015-09-01 DIAGNOSIS — E119 Type 2 diabetes mellitus without complications: Secondary | ICD-10-CM | POA: Diagnosis not present

## 2015-09-01 DIAGNOSIS — I5022 Chronic systolic (congestive) heart failure: Secondary | ICD-10-CM | POA: Diagnosis not present

## 2015-09-01 DIAGNOSIS — I6523 Occlusion and stenosis of bilateral carotid arteries: Secondary | ICD-10-CM | POA: Diagnosis not present

## 2015-09-01 DIAGNOSIS — C761 Malignant neoplasm of thorax: Secondary | ICD-10-CM | POA: Diagnosis not present

## 2015-09-01 DIAGNOSIS — I2510 Atherosclerotic heart disease of native coronary artery without angina pectoris: Secondary | ICD-10-CM | POA: Insufficient documentation

## 2015-09-01 DIAGNOSIS — I428 Other cardiomyopathies: Secondary | ICD-10-CM | POA: Diagnosis not present

## 2015-09-01 LAB — MAGNESIUM: MAGNESIUM: 1.4 mg/dL — AB (ref 1.7–2.4)

## 2015-09-01 LAB — COMPREHENSIVE METABOLIC PANEL
ALT: 19 U/L (ref 14–54)
AST: 22 U/L (ref 15–41)
Albumin: 3.8 g/dL (ref 3.5–5.0)
Alkaline Phosphatase: 49 U/L (ref 38–126)
Anion gap: 10 (ref 5–15)
BUN: 11 mg/dL (ref 6–20)
CHLORIDE: 100 mmol/L — AB (ref 101–111)
CO2: 27 mmol/L (ref 22–32)
Calcium: 9.4 mg/dL (ref 8.9–10.3)
Creatinine, Ser: 0.86 mg/dL (ref 0.44–1.00)
GFR calc Af Amer: >60 mL/min (ref >60)
Glucose, Bld: 138 mg/dL — ABNORMAL HIGH (ref 65–99)
POTASSIUM: 4.2 mmol/L (ref 3.5–5.1)
Sodium: 137 mmol/L (ref 135–145)
Total Bilirubin: 0.4 mg/dL (ref 0.3–1.2)
Total Protein: 6.8 g/dL (ref 6.5–8.1)

## 2015-09-01 MED ORDER — ATORVASTATIN CALCIUM 20 MG PO TABS: 20.0000 mg | ORAL_TABLET | Freq: Every day | ORAL | Status: DC

## 2015-09-01 MED ORDER — FUROSEMIDE 40 MG PO TABS: 40.0000 mg | ORAL_TABLET | Freq: Every day | ORAL | Status: DC

## 2015-09-01 MED ORDER — CARVEDILOL 3.125 MG PO TABS: 3.1250 mg | ORAL_TABLET | Freq: Two times a day (BID) | ORAL | Status: DC

## 2015-09-01 MED ORDER — SPIRONOLACTONE 25 MG PO TABS: 25.0000 mg | ORAL_TABLET | Freq: Every day | ORAL | Status: DC

## 2015-09-01 MED ORDER — DIGOXIN 125 MCG PO TABS: 0.1250 mg | ORAL_TABLET | Freq: Every day | ORAL | Status: DC

## 2015-09-01 MED ORDER — MAGNESIUM OXIDE 400 MG PO TABS: 400.0000 mg | ORAL_TABLET | Freq: Every day | ORAL | Status: DC

## 2015-09-01 MED ORDER — SACUBITRIL-VALSARTAN 49-51 MG PO TABS: 1.0000 | ORAL_TABLET | Freq: Two times a day (BID) | ORAL | Status: DC

## 2015-09-01 NOTE — Telephone Encounter (Signed)
Notes Recorded by Scarlette Calico, RN on 09/01/2015 at 3:37 PM Pt aware and agreeable, rx sent in

## 2015-09-01 NOTE — Telephone Encounter (Signed)
-----   Message from Jolaine Artist, MD sent at 09/01/2015  1:33 PM EDT ----- Start mag ox 400 bid

## 2015-09-01 NOTE — Patient Instructions (Signed)
Start Carvedilol 3.125 mg Twice daily   Labs today  Your physician has recommended that you wear an event monitor. Event monitors are medical devices that record the heart's electrical activity. Doctors most often [place] these monitors to diagnose arrhythmias. Arrhythmias are problems with the speed or rhythm of the heartbeat. The monitor is a small, portable device. You can wear one while you do your normal daily activities. This is usually used to diagnose what is causing palpitations/syncope (passing out).  You have been referred to [facility]  Your physician recommends that you schedule a follow-up appointment in: 6 weeks

## 2015-09-01 NOTE — Progress Notes (Signed)
Patient ID: Emily [name], female   DOB: 01-May-1960, 55 y.o.   MRN: [id number]  ADVANCED [facility] CLINIC NOTE  Patient ID: Emily [name], female   DOB: July 31, 1960, 55 y.o.   MRN: [id number] PCP: Dr Kathlen Mody Primary Cardiologist: Dr. Haroldine Laws  Emily [name] is a 55 yo woman with a history of ER + R breast cancer s/p R lumpectomy, DM2, cholelithiasis s/p cholecystectomy (02/2013), COPD and systolic CHF due to nonischemic cardiomyopathy.  Patient was admitted in 09/2012 with acute pulmonary edema and cardiogenic shock.  LHC showed moderate nonobstructive CAD (40-50% mLAD, 50-70% LCx, 50% PDA).  She was started on milrinone and diuresed.  Cause of cardiomyopathy suspected to be Adriamycin toxicity.    ECHO 10/27/12 EF 20-25% ECHO 01/09/13 EF 30-35% ECHO 06/05/13 EF 45-50% septum dysynnergic Echo 5/17: EF 15-20%  12/05/12 Carotid Dopplers- R 39 % stenosis and L  40-59% stenosis  Recently had surgery to remove R chest leiomyosarcoma. Margins not clear so needs larger resection. Saw Dr. Alen Blew who has ordered scans and will present at tumor board.   She moved back from [place], [place] in May 2017 for recurrent HF symptome. Found to have EF 10-15% with NYHA IIIB-IV symptoms. Was admitted. UDS + cocaine. Cath showed non-obstructive CAD with Cardiogenic shock initial PA sat 34%, 42%. Started on milrinone. Diuresed about 5 pounds. Milrinone weaned off and co-ox dropped down to 52% but was feeling ok. Weight on d/c was 156.   Follow-up: Feeling much better. Doing all activity without limitation. Carrying wood. No dyspnea or PND. SOB with steps. Weight stable up from 153-> 157. Good appetite. "I am getting fat."  Compliant with all meds. No orthopnea or PND. Had 2 episodes of palpitations for 15 seconds.    Cath 07/14/15 Ao = 106/72 (87) LV = 108/28 (36) RA = 22 RV = 60/14/25 PA = 67/22 (51) PCW = 31 Fick cardiac output/index = 2.8/1.6 SVR = 1823 PVR = 7.0 WU FA sat = 90% PA sat = 34%, 42%  Assessment: 1.  Mild non-obstructive CAD 2. Severe NICM with EF 25% by echo 3. Cardiogenic shock physiology with markedly elevated biventricular filling pressures Lexiscan 09/23/13: EF 50% normal perfusion Holter: SR occasional PVCs  Labs (9/14): digoxin 0.4, K 3.6, creatinine 0.62 Labs (9/15): K 4.4 creatinine 0.68 Labs (10/26):    K 3.7 Cr 0.67 Labs: (03/20/2013): K+ 4.6, Cr 0.73 Labs (09/25/13): K 4.2 Cr 0.9  PMH: 1. COPD: PFTs (1/14) with FEV1 68%.  Quit smoking 9/14.  2. Type II diabetes  3. HTN 4. Breast cancer: 2009, s/p Adriamycin/Cytoxan/Taxol chemo, lumpectomy and radiation.   5. Nonischemic cardiomyopathy: Possibly due to Adriamycin.  Echo (8/14) with EF 20-25%, global hypokinesis, moderate diastolic dysfunction, normal RV size and with mild to moderately decreased systolic function, moderate TR.   6. CAD: LHC (8/14) with nonobstructive CAD; 40-50% mLAD, 50-70% LCx, 50% PDA.  7. Hyperlipidemia  SH: Lives alone in [place].  Mother is next door.  Still smoking.   FH: No premature CAD.  No family history of cardiomyopathy.   ROS: All systems reviewed and negative except as per HPI.   Current Outpatient Prescriptions  Medication Sig Dispense Refill  . albuterol (PROVENTIL HFA;VENTOLIN HFA) 108 (90 BASE) MCG/ACT inhaler Inhale 2 puffs into the lungs every 6 (six) hours as needed for wheezing or shortness of breath. 1 Inhaler 0  . aspirin EC 81 MG tablet Take 1 tablet (81 mg total) by mouth daily. 90 tablet 3  .  atorvastatin (LIPITOR) 20 MG tablet Take 1 tablet (20 mg total) by mouth daily. 90 tablet 3  . Cholecalciferol (VITAMIN D) 2000 UNITS CAPS Take 1 capsule by mouth daily.    . digoxin (LANOXIN) 0.125 MG tablet Take 1 tablet (0.125 mg total) by mouth daily. 30 tablet 3  . furosemide (LASIX) 40 MG tablet Take 1 tablet (40 mg total) by mouth daily. 30 tablet 6  . gabapentin (NEURONTIN) 400 MG capsule Take 2 capsules (800 mg total) by mouth 3 (three) times daily. 180 capsule 2  .  glipiZIDE (GLUCOTROL) 5 MG tablet Take 1 tablet (5 mg total) by mouth 2 (two) times daily before a meal. 60 tablet 2  . glucose blood test strip Test bid. 180 each 3  . letrozole (FEMARA) 2.5 MG tablet Take 1 tablet (2.5 mg total) by mouth every morning. 90 tablet 2  . loperamide (IMODIUM) 2 MG capsule Take 1 capsule (2 mg total) by mouth daily as needed for diarrhea or loose stools. 90 capsule 0  . LORazepam (ATIVAN) 1 MG tablet     . metFORMIN (GLUCOPHAGE) 1000 MG tablet Take 1 tablet (1,000 mg total) by mouth 2 (two) times daily with a meal. 180 tablet 1  . sacubitril-valsartan (ENTRESTO) 49-51 MG Take 1 tablet by mouth 2 (two) times daily. 60 tablet 6  . spironolactone (ALDACTONE) 25 MG tablet Take 1 tablet (25 mg total) by mouth daily. 30 tablet 6  . venlafaxine XR (EFFEXOR-XR) 150 MG 24 hr capsule Take 150 mg by mouth daily with breakfast.     No current facility-administered medications for this encounter.    Filed Vitals:   09/01/15 0946  BP: 124/76  Pulse: 96  Weight: 159 lb 8 oz (72.349 kg)  SpO2: 98%    General: NAD Neck: JVP flat, no thyromegaly or thyroid nodule.  Lungs: Clear to auscultation bilaterally with normal respiratory effort. Decreased BS throughout.  CV: Laterally displaced PMI.  Heart regular S1/S2, no S3/S4, no murmur.  No edema.  No carotid bruit.  Normal pedal pulses Abdomen: Soft, nontender, no hepatosplenomegaly, no distention. R subclavian scar Skin: Intact without lesions or rashes.  Neurologic: Alert and oriented x 3.  Psych: Normal affect. Extremities: No clubbing or cyanosis. DPs 1+ bilateral  HEENT: Normal.   Assessment/Plan: 1. Chronic systolic CHF: NICM, likely Adriamycin toxicity,. EF had previously recovered but now way back down  20% - Recently admitted for cardiogenic shock 5/17. Required milrinone. Now off.  - Improved now NYHA II. Volume status ok  - Continue Digoxin 0.125mg  daily, Entresto 49/51, spiro 25, lasix 40 daily - Add  carvedilol 3.125 bid - PharmD to see in 2-3 weeks to increase Entresto 2. CAD: Moderate nonobstructive disease. -Continue ASA and statin 3. COPD/tobacco use - has cut way back. Trying to quit with patch 4. Cocaine use- says she has quit  5. Breast Cancer: received adriamycin. Cancer free for > 5 years. 6. Carotid stenosis: asymptomatic. Will get yearly u/s. Continue asa and statin.  7. R chest leiomyosarcoma: -has had resection but margins were not clear. Has seen Dr. Alen Blew who will present at tumor board.  -currently her heart is too weak for invasive treatment.  8. Palpitations:  --place event monitor  Bensimhon, Daniel,MD 10:22 AM

## 2015-09-01 NOTE — Addendum Note (Signed)
Encounter addended by: Scarlette Calico, RN on: 09/01/2015 10:38 AM<BR>     Documentation filed: [employer], Patient Instructions Section, Orders

## 2015-09-02 ENCOUNTER — Telehealth: Payer: Self-pay | Admitting: *Deleted

## 2015-09-02 NOTE — Telephone Encounter (Signed)
Patient called asking "how to find out if CT scan has been authorized.  I've called my insurance company and they haven't received anything."   Message left for [employer].  Requesting call to patient with update on this request.

## 2015-09-03 NOTE — Telephone Encounter (Signed)
CT Scan currently scheduled.

## 2015-09-15 ENCOUNTER — Encounter: Payer: Self-pay | Admitting: *Deleted

## 2015-09-15 ENCOUNTER — Ambulatory Visit (HOSPITAL_COMMUNITY): Admission: RE | Admit: 2015-09-15 | Payer: Medicare Other | Source: Ambulatory Visit

## 2015-09-15 NOTE — Progress Notes (Signed)
Called patient to ask why she was a no show for her CT scan today. Patient states she overslept and was trying to call and re-schedule it now. Given direct number to radiology for her to re-schedule. Note to dr Hazeline Junker desk.

## 2015-09-16 ENCOUNTER — Encounter (HOSPITAL_COMMUNITY): Payer: Self-pay

## 2015-09-16 ENCOUNTER — Ambulatory Visit (HOSPITAL_COMMUNITY)
Admission: RE | Admit: 2015-09-16 | Discharge: 2015-09-16 | Disposition: A | Discharge status: Home / Self Care | Payer: Medicare Other | Source: Ambulatory Visit | Attending: Oncology | Admitting: Oncology

## 2015-09-16 DIAGNOSIS — J701 Chronic and other pulmonary manifestations due to radiation: Secondary | ICD-10-CM | POA: Diagnosis not present

## 2015-09-16 DIAGNOSIS — F17200 Nicotine dependence, unspecified, uncomplicated: Secondary | ICD-10-CM | POA: Diagnosis not present

## 2015-09-16 DIAGNOSIS — C499 Malignant neoplasm of connective and soft tissue, unspecified: Secondary | ICD-10-CM | POA: Insufficient documentation

## 2015-09-16 DIAGNOSIS — I2510 Atherosclerotic heart disease of native coronary artery without angina pectoris: Secondary | ICD-10-CM | POA: Insufficient documentation

## 2015-09-16 DIAGNOSIS — I700 Atherosclerosis of aorta: Secondary | ICD-10-CM | POA: Diagnosis not present

## 2015-09-16 DIAGNOSIS — R918 Other nonspecific abnormal finding of lung field: Secondary | ICD-10-CM | POA: Diagnosis not present

## 2015-09-16 MED ORDER — IOPAMIDOL (ISOVUE-300) INJECTION 61%
100.0000 mL | Freq: Once | INTRAVENOUS | Status: AC | PRN
  Administered 2015-09-16: 75 mL via INTRAVENOUS

## 2015-09-22 ENCOUNTER — Ambulatory Visit (HOSPITAL_COMMUNITY)
Admission: RE | Admit: 2015-09-22 | Discharge: 2015-09-22 | Disposition: A | Discharge status: Home / Self Care | Payer: Medicare Other | Source: Ambulatory Visit | Attending: Cardiology | Admitting: Cardiology

## 2015-09-22 VITALS — BP 140/88 | HR 107 | Wt 160.6 lb

## 2015-09-22 DIAGNOSIS — J449 Chronic obstructive pulmonary disease, unspecified: Secondary | ICD-10-CM | POA: Insufficient documentation

## 2015-09-22 DIAGNOSIS — C761 Malignant neoplasm of thorax: Secondary | ICD-10-CM | POA: Diagnosis not present

## 2015-09-22 DIAGNOSIS — F17200 Nicotine dependence, unspecified, uncomplicated: Secondary | ICD-10-CM | POA: Insufficient documentation

## 2015-09-22 DIAGNOSIS — I2510 Atherosclerotic heart disease of native coronary artery without angina pectoris: Secondary | ICD-10-CM | POA: Insufficient documentation

## 2015-09-22 DIAGNOSIS — Z853 Personal history of malignant neoplasm of breast: Secondary | ICD-10-CM | POA: Insufficient documentation

## 2015-09-22 DIAGNOSIS — I5023 Acute on chronic systolic (congestive) heart failure: Secondary | ICD-10-CM

## 2015-09-22 DIAGNOSIS — R002 Palpitations: Secondary | ICD-10-CM | POA: Diagnosis not present

## 2015-09-22 DIAGNOSIS — I6529 Occlusion and stenosis of unspecified carotid artery: Secondary | ICD-10-CM | POA: Insufficient documentation

## 2015-09-22 DIAGNOSIS — I5022 Chronic systolic (congestive) heart failure: Secondary | ICD-10-CM | POA: Insufficient documentation

## 2015-09-22 DIAGNOSIS — Z9049 Acquired absence of other specified parts of digestive tract: Secondary | ICD-10-CM | POA: Diagnosis not present

## 2015-09-22 DIAGNOSIS — R0602 Shortness of breath: Secondary | ICD-10-CM | POA: Diagnosis not present

## 2015-09-22 DIAGNOSIS — F1410 Cocaine abuse, uncomplicated: Secondary | ICD-10-CM | POA: Insufficient documentation

## 2015-09-22 LAB — BASIC METABOLIC PANEL WITH GFR
Anion gap: 10 (ref 5–15)
BUN: 20 mg/dL (ref 6–20)
CO2: 25 mmol/L (ref 22–32)
Calcium: 9.5 mg/dL (ref 8.9–10.3)
Chloride: 99 mmol/L — ABNORMAL LOW (ref 101–111)
Creatinine, Ser: 0.95 mg/dL (ref 0.44–1.00)
GFR calc Af Amer: >60 mL/min
GFR calc non Af Amer: >60 mL/min
Glucose, Bld: 225 mg/dL — ABNORMAL HIGH (ref 65–99)
Potassium: 4.8 mmol/L (ref 3.5–5.1)
Sodium: 134 mmol/L — ABNORMAL LOW (ref 135–145)

## 2015-09-22 LAB — MAGNESIUM: Magnesium: 1.4 mg/dL — ABNORMAL LOW (ref 1.7–2.4)

## 2015-09-22 MED ORDER — SACUBITRIL-VALSARTAN 97-103 MG PO TABS: 1.0000 | ORAL_TABLET | Freq: Two times a day (BID) | ORAL | 3 refills | Status: DC

## 2015-09-22 MED ORDER — MAGNESIUM OXIDE 400 MG PO TABS: 400.0000 mg | ORAL_TABLET | Freq: Two times a day (BID) | ORAL | 5 refills | Status: DC

## 2015-09-22 MED ORDER — CARVEDILOL 3.125 MG PO TABS: 3.1250 mg | ORAL_TABLET | Freq: Two times a day (BID) | ORAL | 3 refills | Status: DC

## 2015-09-22 NOTE — Patient Instructions (Signed)
It was great to see you today!  Please INCREASE Entresto to 97/103 mg TWICE DAILY. You may continue to take your Entresto 49-51 mg tablets but take 2 tablets TWICE daily until you get the new strength.   Labs today. Will call with any abnormalities.   Labs again on 8/4.   Please keep your appointment with Dr. Haroldine Laws on 8/18.

## 2015-09-22 NOTE — Progress Notes (Signed)
HPI:  [name] is a 55 yo woman with a history of ER+  breast cancer s/p R lumpectomy, DM2, cholelithiasis s/p cholecystectomy (02/2013), COPD and systolic CHF due to nonischemic cardiomyopathy.  Patient was admitted in 09/2012 with acute pulmonary edema and cardiogenic shock.  LHC showed moderate nonobstructive CAD (40-50% mLAD, 50-70% LCx, 50% PDA).  She was started on milrinone and diuresed.  Cause of cardiomyopathy suspected to be Adriamycin toxicity.   [name] recurrent HF symptoms. Found to have EF 10-15% with NYHA IIIB-IV symptoms. Was admitted. UDS + cocaine. Cath showed non-obstructive CAD with Cardiogenic shock initial PA sat 34%. Started on milrinone. Diuresed about 5 lbs. Milrinone weaned off and co-ox dropped down to 52% but was feeling ok. Weight on d/c was 156.  She presents today for pharmacist-led HF medication titration. At her last HF clinic visit on 7/5, carvedilol 3.125 mg PO BID was initiated. She has felt well since last visit and is very excited about her upcoming cruise to [name] at the end of August. She currently has 30 day event monitor on through middle of August. Not feeling palpitations since placed. She has been feeling more tired in the mornings since initiating carvedilol. Admits to smoking tobacco ~1 ppd but assures me that she is not using cocaine. Did smoke a cigarette just before appointment today.   . Shortness of breath/dyspnea on exertion? Yes - when walking up 1 flight of steps (also has "bad" knees) . Orthopnea/PND? no  . Edema? no . Lightheadedness/dizziness? no . Daily weights at home? Yes - 154-159 lbs (stable) . Blood pressure/heart rate monitoring at home? No . Following low-sodium/fluid-restricted diet? Yes - still trying to lose weight by eating baked lean proteins/vegetables but admits to drinking > 2 L of fluid most days   HF Medications: Carvedilol 3.125 mg PO BID Digoxin 0.125 mg PO daily  Furosemide  40 mg PO daily Magnesium oxide 400 mg PO daily - not able to swallow well Entresto 49-51 mg PO BID  Spironolactone 25 mg PO daily  Has the patient been experiencing any side effects to the medications prescribed?  no  Does the patient have any problems obtaining medications due to transportation or finances?   no - [employer] + [employer]   Understanding of regimen: good Understanding of indications: good Potential of compliance: good Patient understands to avoid NSAIDs. Patient understands to avoid decongestants.    Pertinent Lab Values: . 09/22/15: Serum creatinine 0.95 (BL 0.7-0.8), BUN 20, Potassium 4.8, Sodium 134, Mag 1.4 on MagOx 400 mg BID but not able to swallow them well  . 07/29/15: Dig 0.6  Vital Signs: . Weight: 160 lb (last HF clinic visit weight: 159 lbs) . Blood pressure: 140/88 mmHg  . Heart rate: 107 bpm - states she smoked a cigarette just before appointment  1. Chronicsystolic CHF (EF 0000000), NICM, likely Adriamycin toxicity. NYHA class II symptoms. - Volume status seems stable today (1 lb up from last visit) - Continue carvedilol 3.125 mg BID, digoxin 0.125 mg daily, spironolactone 25 mg daily, furosemide 40 mg daily, magnesium oxide 400 mg PO BID - Increase Entresto to 97/103 mg PO BID - Basic disease state pathophysiology, medication indication, mechanism and side effects reviewed at length with patient and he verbalized understanding 2. CAD - moderate non-obstructive  - Continue ASA 81 mg daily and atorvastatin 20 mg daily 3. COPD/tobacco abuse - Continues to smoke ~1 ppd - Discussed cessation  options and will consider for next appointment  4. Cocaine abuse - States that she has quit and has not used anytime recently 5. Palpitations - 30-day event monitor on through August 6. Carotid stenosis - Asymptomatic - Yearly U/S - Continue ASA + atorvastatin 7. R chest leiomyosarcoma - S/p resection but margins not clear - Dr. Alen [name] to present at tumor  board - Heart currently too weak for invasive treatment 8. Breast cancer - received Adriamycin  - Remission > 5 years - Continue [name] per oncology  Plan: 1) Medication changes: Based on clinical presentation, vital signs and recent labs will increase Entresto to 97/103 mg PO BID 2) Labs: BMET + Mag today  3) Follow-up: BMET in 10 days and Dr. Haroldine [name] on 8/18   [name], PharmD, BCPS, CPP Clinical Pharmacist Pager: [phone] Phone: [phone] 09/22/2015 1:32 PM   HF stable. Agree with PharmD note as above. Continue to titrate HF meds as tolerated.   Emily Blundell,MD 10:43 PM

## 2015-09-23 ENCOUNTER — Encounter: Payer: Self-pay | Admitting: Family Medicine

## 2015-09-28 ENCOUNTER — Telehealth (HOSPITAL_COMMUNITY): Payer: Self-pay | Admitting: Pharmacist

## 2015-09-28 NOTE — Telephone Encounter (Signed)
[name] called asking for a 90 day supply spironolactone Rx. I have called her pharmacy ([facility] in [place]) who verified that they had a 90 day Rx on file from 7/5 for her. They will fill now and have it ready for her by tomorrow. I attempted to call [name] back with this information but she did not answer and her VM box was not set up yet.   [name]. Velva Harman, PharmD, BCPS, CPP Clinical Pharmacist Pager: [phone] Phone: [phone] 09/28/2015 4:05 PM

## 2015-09-30 ENCOUNTER — Ambulatory Visit: Payer: Medicare Other | Admitting: Oncology

## 2015-10-01 ENCOUNTER — Ambulatory Visit (HOSPITAL_COMMUNITY): Payer: Medicare Other | Admitting: Oncology

## 2015-10-01 ENCOUNTER — Other Ambulatory Visit (HOSPITAL_COMMUNITY): Payer: Medicare Other

## 2015-10-04 ENCOUNTER — Telehealth (HOSPITAL_COMMUNITY): Payer: Self-pay | Admitting: Pharmacist

## 2015-10-04 NOTE — Telephone Encounter (Signed)
Received a VM from patient stating she missed her lab appointment (BMET/Mag) last Friday and wanted to know if she could come in today. Attempted to call patient back to verify that this was ok and she did not answer and did not have a VM set up.   [name]. Velva Harman, PharmD, BCPS, CPP Clinical Pharmacist Pager: [phone] Phone: [phone] 10/04/2015 9:41 AM

## 2015-10-08 ENCOUNTER — Ambulatory Visit
Admission: RE | Admit: 2015-10-08 | Discharge: 2015-10-08 | Disposition: A | Discharge status: Home / Self Care | Payer: Medicare Other | Source: Ambulatory Visit | Attending: Oncology | Admitting: Oncology

## 2015-10-08 ENCOUNTER — Ambulatory Visit (HOSPITAL_COMMUNITY)
Admission: RE | Admit: 2015-10-08 | Discharge: 2015-10-08 | Disposition: A | Discharge status: Home / Self Care | Payer: Medicare Other | Source: Ambulatory Visit | Attending: Internal Medicine | Admitting: Internal Medicine

## 2015-10-08 DIAGNOSIS — I5022 Chronic systolic (congestive) heart failure: Secondary | ICD-10-CM | POA: Diagnosis not present

## 2015-10-08 DIAGNOSIS — Z1231 Encounter for screening mammogram for malignant neoplasm of breast: Secondary | ICD-10-CM

## 2015-10-08 LAB — BASIC METABOLIC PANEL
Anion gap: 12 (ref 5–15)
BUN: 19 mg/dL (ref 6–20)
CALCIUM: 9.1 mg/dL (ref 8.9–10.3)
CHLORIDE: 98 mmol/L — AB (ref 101–111)
CO2: 24 mmol/L (ref 22–32)
CREATININE: 1 mg/dL (ref 0.44–1.00)
GFR calc non Af Amer: >60 mL/min (ref >60)
Glucose, Bld: 240 mg/dL — ABNORMAL HIGH (ref 65–99)
Potassium: 5 mmol/L (ref 3.5–5.1)
SODIUM: 134 mmol/L — AB (ref 135–145)

## 2015-10-08 LAB — MAGNESIUM: MAGNESIUM: 1.7 mg/dL (ref 1.7–2.4)

## 2015-10-11 ENCOUNTER — Other Ambulatory Visit (HOSPITAL_COMMUNITY): Payer: Medicare Other

## 2015-10-14 ENCOUNTER — Ambulatory Visit (HOSPITAL_COMMUNITY): Payer: Medicare Other | Admitting: Psychiatry

## 2015-10-14 ENCOUNTER — Ambulatory Visit (HOSPITAL_BASED_OUTPATIENT_CLINIC_OR_DEPARTMENT_OTHER): Payer: Medicare Other | Admitting: Oncology

## 2015-10-14 ENCOUNTER — Telehealth: Payer: Self-pay | Admitting: Oncology

## 2015-10-14 DIAGNOSIS — C50911 Malignant neoplasm of unspecified site of right female breast: Secondary | ICD-10-CM

## 2015-10-14 DIAGNOSIS — C499 Malignant neoplasm of connective and soft tissue, unspecified: Secondary | ICD-10-CM | POA: Diagnosis not present

## 2015-10-14 DIAGNOSIS — Z853 Personal history of malignant neoplasm of breast: Secondary | ICD-10-CM

## 2015-10-14 MED ORDER — LETROZOLE 2.5 MG PO TABS: 2.5000 mg | ORAL_TABLET | Freq: Every morning | ORAL | 2 refills | Status: DC

## 2015-10-14 NOTE — Progress Notes (Signed)
Hematology and Oncology Follow Up Visit  Emily [name] [id number] 02/16/1961 55 y.o. 10/14/2015 3:38 PM [name], [name], Emily A, DO   Principle Diagnosis: 55 year old woman with the following issues:  1. Leiomyosarcoma of the right chest wall. She is status post excision of 3.5 x 2.0 x 1.7 cm lesion with positive margins in April 2017. 2. Breast cancer diagnosed in 2009. At that time she had [name] stage IIIc disease with 3.1 cm tumor and 5 out of 7 lymph nodes involved. Her tumor was ER/PR positive HER-2 negative.  3. Nonischemic cardiomyopathy related to Adriamycin exposure.  Prior Therapy:  She was treated with Adriamycin and cyclophosphamide in [name] dose dense fashion and weekly Taxol. She received radiation after lumpectomy and tamoxifen and subsequently Arimidex and subsequently letrozole  Current therapy: Letrozole 2.5 mg daily since 2010.  Interim History: [name] presents today for [name] follow-up visit. Since the last visit, she is reporting no major changes in her health. She denied any chest pain or shortness of breath or hospitalization from [name] cardiac standpoint. She denied any nodules on her skin or any new rashes or lesions. She continues to perform activities of daily living without any hindrance or decline. She denied any lower extremity edema or weight gain.  She does not report any headaches, blurry vision, syncope or seizures. She does not report any fevers or chills or sweats. She does not report any cough, wheezing or hemoptysis. She does not report any nausea, vomiting or abdominal pain. She does not report any frequency urgency or hesitancy. She does not report any skeletal complaints. Remaining review of systems unremarkable.   Medications: I have reviewed the patient's current medications.  Current Outpatient Prescriptions  Medication Sig Dispense Refill  . albuterol (PROVENTIL HFA;VENTOLIN HFA) 108 (90 BASE) MCG/ACT inhaler Inhale 2 puffs into the lungs every 6 (six)  hours as needed for wheezing or shortness of breath. 1 Inhaler 0  . aspirin EC 81 MG tablet Take 1 tablet (81 mg total) by mouth daily. 90 tablet 3  . atorvastatin (LIPITOR) 20 MG tablet Take 1 tablet (20 mg total) by mouth daily. 90 tablet 2  . carvedilol (COREG) 3.125 MG tablet Take 1 tablet (3.125 mg total) by mouth 2 (two) times daily. 180 tablet 3  . Cholecalciferol (VITAMIN D) 2000 UNITS CAPS Take 1 capsule by mouth daily.    . digoxin (LANOXIN) 0.125 MG tablet Take 1 tablet (0.125 mg total) by mouth daily. 90 tablet 2  . furosemide (LASIX) 40 MG tablet Take 1 tablet (40 mg total) by mouth daily. 90 tablet 2  . gabapentin (NEURONTIN) 400 MG capsule Take 2 capsules (800 mg total) by mouth 3 (three) times daily. 180 capsule 2  . glipiZIDE (GLUCOTROL) 5 MG tablet Take 1 tablet (5 mg total) by mouth 2 (two) times daily before [name] meal. 60 tablet 2  . glucose blood test strip Test bid. 180 each 3  . letrozole (FEMARA) 2.5 MG tablet Take 1 tablet (2.5 mg total) by mouth every morning. 90 tablet 2  . loperamide (IMODIUM) 2 MG capsule Take 1 capsule (2 mg total) by mouth daily as needed for diarrhea or loose stools. (Patient not taking: Reported on 09/22/2015) 90 capsule 0  . LORazepam (ATIVAN) 1 MG tablet Take 1 mg by mouth at bedtime.     . magnesium oxide (MAG-OX) 400 MG tablet Take 1 tablet (400 mg total) by mouth 2 (two) times daily. 60 tablet 5  . Melatonin 10 MG  TABS Take 10 mg by mouth at bedtime.    . metFORMIN (GLUCOPHAGE) 1000 MG tablet Take 1 tablet (1,000 mg total) by mouth 2 (two) times daily with [name] meal. 180 tablet 1  . sacubitril-valsartan (ENTRESTO) 97-103 MG Take 1 tablet by mouth 2 (two) times daily. 180 tablet 3  . senna (SENOKOT) 8.6 MG tablet Take 1 tablet by mouth daily.    Marland Kitchen spironolactone (ALDACTONE) 25 MG tablet Take 1 tablet (25 mg total) by mouth daily. 90 tablet 2  . venlafaxine XR (EFFEXOR-XR) 150 MG 24 hr capsule Take 150 mg by mouth daily with breakfast.     No current  facility-administered medications for this visit.      Allergies:  Allergies  Allergen Reactions  . Contrast Media [Iodinated Diagnostic Agents] Anaphylaxis  . Gadolinium Shortness Of Breath     Code: SOB, Onset Date: [id number]     Past Medical History, Surgical history, Social history, and Family History were reviewed and updated.   Physical Exam: Reviewed today which showed [name] blood pressure 126/72, heart rate of 98, respiratory rate of 18 and oxygen saturation of 94%. She was afebrile with [name] temperature of 97.5.  ECOG: 1 General appearance: alert and cooperative Head: Normocephalic, without obvious abnormality Neck: no adenopathy Lymph nodes: Cervical, supraclavicular, and axillary nodes normal. Heart:regular rate and rhythm, S1, S2 normal, no murmur, click, rub or gallop Lung:chest clear, no wheezing, rales, normal symmetric air entry Abdomin: soft, non-tender, without masses or organomegaly EXT:no erythema, induration, or nodules   Lab Results: Lab Results  Component Value Date   WBC 12.1 (H) 07/15/2015   HGB 11.3 (L) 07/15/2015   HCT 35.4 (L) 07/15/2015   MCV 89.4 07/15/2015   PLT 203 07/15/2015     Chemistry      Component Value Date/Time   NA 134 (L) 10/08/2015 1400   NA 136 04/21/2013 1159   K 5.0 10/08/2015 1400   K 4.3 06/21/2012   CL 98 (L) 10/08/2015 1400   CL 102 06/21/2012   CO2 24 10/08/2015 1400   CO2 27 06/21/2012   BUN 19 10/08/2015 1400   BUN 14 04/21/2013 1159   CREATININE 1.00 10/08/2015 1400   CREATININE 0.79 09/30/2014 0001      Component Value Date/Time   CALCIUM 9.1 10/08/2015 1400   CALCIUM 10.2 06/21/2012   ALKPHOS 49 09/01/2015 1037   AST 22 09/01/2015 1037   AST 57 06/21/2012   ALT 19 09/01/2015 1037   BILITOT 0.4 09/01/2015 1037   BILITOT 0.7 06/21/2012       EXAM: CT CHEST WITH CONTRAST  TECHNIQUE: Multidetector CT imaging of the chest was performed during intravenous contrast administration.  CONTRAST:  21m  ISOVUE-300 IOPAMIDOL (ISOVUE-300) INJECTION 61%  COMPARISON:  05/15/2011 chest CT.  FINDINGS: Mediastinum/Nodes: Top-normal heart size. No significant pericardial fluid/thickening. Left anterior descending coronary atherosclerosis. Atherosclerotic nonaneurysmal thoracic aorta. Normal caliber pulmonary arteries. No central pulmonary emboli. Hypodense 1.2 cm right thyroid lobe nodule. Unremarkable esophagus. No pathologically enlarged axillary, mediastinal or hilar lymph nodes.  Lungs/Pleura: No pneumothorax. No pleural effusion. Right lower lobe 5 mm solid pulmonary nodule (series 5/ image 97) is stable back to 05/15/2011 and considered benign. Irregular subpleural nodular opacity at the left lower lobe base (series 5/ images 119-126) is stable since 05/15/2011 and considered benign. Mild subpleural reticulation in the anterior mid to upper right lung is unchanged since 05/15/2011 and consistent with mild radiation fibrosis. Diffuse ground-glass centrilobular micro nodularity throughout both lungs, upper lobe  predominant, unchanged back to the 05/15/2011. No acute consolidative airspace disease or new significant pulmonary nodules.  Upper abdomen: Cholecystectomy.  Musculoskeletal: No aggressive appearing focal osseous lesions. Superficial calcifications and distortion in the lateral right breast is consistent with post lumpectomy change. No mass or fluid collection is seen in the chest wall.  IMPRESSION: 1. No evidence of [name] residual or recurrent mass in the chest wall. 2. No evidence of metastatic disease in the chest. 3. Diffuse ground-glass centrilobular micronodularity throughout both lungs, upper lobe predominant, unchanged back to 2013. If the patient is [name] current smoker, this finding is most suggestive of smoking related interstitial lung disease. If the patient is not [name] current smoker, this finding could indicate subacute hypersensitivity pneumonitis or chronic  postinflammatory nodularity. 4. Stable mild radiation fibrosis in the anterior mid to upper right lung. 5. Aortic atherosclerosis.  One vessel coronary atherosclerosis.      Impression and Plan:  55 year old woman with the following issues:  1. Leiomyosarcoma of the right chest wall. She is status post excision of 3.5 x 2.0 x 1.7 cm lesion with positive margins. This is in the setting of [name] history of breast cancer and breast radiation completed in 2009.  CT scan on 09/16/2015 was personally reviewed and showed no evidence of metastatic disease. Her case was discussed and the sarcoma tumor Board and her pathology slides were discussed with our pathologist.  After discussion, she will require reexcision of her tumor site given the high risk of recurrence associated with her positive margins. Dr. Barry Dienes from [facility] felt that is certainly [name] durable procedure even with her cardiac history. Cardiac clearance by be needed prior to the procedure.  No further therapy is needed from [name] medical oncology standpoint beyond reexcision surgery. Active surveillance will be required after that.  2. History of breast cancer: Diagnosed in 2009 with [name] stage IIIc. She is status post adjuvant chemotherapy initially with chemotherapy, radiation therapy and currently on adjuvant hormonal therapy for the last 7 years. I have recommended continuing Letrozole  for at least 3 years and she has no objections to that. Her last mammogram was obtained on 10/08/2015 and showed no abnormalities.  She has been seen in the past by Dr. Whitney Muse and I offered her return follow-up with her once her sarcoma issues resolve and I will leave it up to the patient to decide whether she was to do so or not.  3. Follow-up: Will be in 3 months to follow her progress after surgery.  Zola Button, MD 8/17/20173:38 PM

## 2015-10-14 NOTE — Telephone Encounter (Signed)
Gave patient avs report and appointments for December. Patient also given appointment with Dr. Barry Dienes 10/22/2015 @ 11:30 am to arrive 11 am - spoke with Santiago Glad.

## 2015-10-15 ENCOUNTER — Ambulatory Visit (HOSPITAL_COMMUNITY)
Admission: RE | Admit: 2015-10-15 | Discharge: 2015-10-15 | Disposition: A | Discharge status: Home / Self Care | Payer: Medicare Other | Source: Ambulatory Visit | Attending: Internal Medicine | Admitting: Internal Medicine

## 2015-10-15 VITALS — BP 114/70 | HR 92 | Wt 159.0 lb

## 2015-10-15 DIAGNOSIS — Z7984 Long term (current) use of oral hypoglycemic drugs: Secondary | ICD-10-CM | POA: Diagnosis not present

## 2015-10-15 DIAGNOSIS — I5022 Chronic systolic (congestive) heart failure: Secondary | ICD-10-CM | POA: Diagnosis present

## 2015-10-15 DIAGNOSIS — J449 Chronic obstructive pulmonary disease, unspecified: Secondary | ICD-10-CM | POA: Insufficient documentation

## 2015-10-15 DIAGNOSIS — R002 Palpitations: Secondary | ICD-10-CM | POA: Diagnosis not present

## 2015-10-15 DIAGNOSIS — Z7982 Long term (current) use of aspirin: Secondary | ICD-10-CM | POA: Diagnosis not present

## 2015-10-15 DIAGNOSIS — I428 Other cardiomyopathies: Secondary | ICD-10-CM | POA: Diagnosis not present

## 2015-10-15 DIAGNOSIS — Z9221 Personal history of antineoplastic chemotherapy: Secondary | ICD-10-CM | POA: Diagnosis not present

## 2015-10-15 DIAGNOSIS — I2510 Atherosclerotic heart disease of native coronary artery without angina pectoris: Secondary | ICD-10-CM | POA: Insufficient documentation

## 2015-10-15 DIAGNOSIS — Z720 Tobacco use: Secondary | ICD-10-CM | POA: Diagnosis not present

## 2015-10-15 DIAGNOSIS — F17200 Nicotine dependence, unspecified, uncomplicated: Secondary | ICD-10-CM | POA: Insufficient documentation

## 2015-10-15 DIAGNOSIS — I6529 Occlusion and stenosis of unspecified carotid artery: Secondary | ICD-10-CM | POA: Insufficient documentation

## 2015-10-15 DIAGNOSIS — Z79811 Long term (current) use of aromatase inhibitors: Secondary | ICD-10-CM | POA: Insufficient documentation

## 2015-10-15 DIAGNOSIS — E119 Type 2 diabetes mellitus without complications: Secondary | ICD-10-CM | POA: Insufficient documentation

## 2015-10-15 DIAGNOSIS — Z79899 Other long term (current) drug therapy: Secondary | ICD-10-CM | POA: Insufficient documentation

## 2015-10-15 DIAGNOSIS — Z853 Personal history of malignant neoplasm of breast: Secondary | ICD-10-CM | POA: Insufficient documentation

## 2015-10-15 DIAGNOSIS — E785 Hyperlipidemia, unspecified: Secondary | ICD-10-CM | POA: Diagnosis not present

## 2015-10-15 DIAGNOSIS — C761 Malignant neoplasm of thorax: Secondary | ICD-10-CM | POA: Diagnosis not present

## 2015-10-15 LAB — BASIC METABOLIC PANEL
ANION GAP: 9 (ref 5–15)
BUN: 28 mg/dL — ABNORMAL HIGH (ref 6–20)
CALCIUM: 9.4 mg/dL (ref 8.9–10.3)
CHLORIDE: 99 mmol/L — AB (ref 101–111)
CO2: 24 mmol/L (ref 22–32)
CREATININE: 1.28 mg/dL — AB (ref 0.44–1.00)
GFR calc non Af Amer: 45 mL/min — ABNORMAL LOW (ref >60)
GFR, EST AFRICAN AMERICAN: 53 mL/min — AB (ref >60)
Glucose, Bld: 183 mg/dL — ABNORMAL HIGH (ref 65–99)
Potassium: 5.2 mmol/L — ABNORMAL HIGH (ref 3.5–5.1)
SODIUM: 132 mmol/L — AB (ref 135–145)

## 2015-10-15 LAB — BRAIN NATRIURETIC PEPTIDE: B NATRIURETIC PEPTIDE 5: 10.9 pg/mL (ref 0.0–100.0)

## 2015-10-15 LAB — MAGNESIUM: MAGNESIUM: 1.9 mg/dL (ref 1.7–2.4)

## 2015-10-15 NOTE — Progress Notes (Signed)
Patient ID: [name], female   DOB: 1960/04/17, 55 y.o.   MRN: [id number]  ADVANCED HF CLINIC NOTE  Patient ID: [name], female   DOB: 10-Dec-1960, 55 y.o.   MRN: [id number] PCP: Dr Kathlen Mody Primary Cardiologist: Dr. Haroldine Laws  Emily [name] is a 55 yo woman with a history of ER + R breast cancer s/p R lumpectomy, DM2, cholelithiasis s/p cholecystectomy (02/2013), COPD and systolic CHF due to nonischemic cardiomyopathy.  Patient was admitted in 09/2012 with acute pulmonary edema and cardiogenic shock.  LHC showed moderate nonobstructive CAD (40-50% mLAD, 50-70% LCx, 50% PDA).  She was started on milrinone and diuresed.  Cause of cardiomyopathy suspected to be Adriamycin toxicity.    ECHO 10/27/12 EF 20-25% ECHO 01/09/13 EF 30-35% ECHO 06/05/13 EF 45-50% septum dysynnergic Echo 5/17: EF 15-20%  12/05/12 Carotid Dopplers- R 39 % stenosis and L  40-59% stenosis  Had surgery 4/17 in [place] to remove R chest leiomyosarcoma. Margins not clear so needs larger resection. Saw Dr. Alen Blew who has ordered scans and has presented to Tumor Board. Pending appt with Dr. Barry Dienes 10/22/15.   She moved back from [place], [place] in May 2017 for recurrent HF symptome. Found to have EF 10-15% with NYHA IIIB-IV symptoms. Was admitted. UDS + cocaine. Cath showed non-obstructive CAD with Cardiogenic shock initial PA sat 34%, 42%. Started on milrinone. Diuresed about 5 pounds. Milrinone weaned off and co-ox dropped down to 52% but was feeling ok. Weight on d/c was 156.   Follow-up: Feeling better. Doing all activity without limitation. Only real complaint is fatigue. Says she sleeps a lot. Weight relatively stable 153-157 range. No orthopnea or PND. No edema. Still smoking 1 ppd. No ETOH or cocaine. About to go about 21 day cruise to [place] and [place].     Cath 07/14/15 Ao = 106/72 (87) LV = 108/28 (36) RA = 22 RV = 60/14/25 PA = 67/22 (51) PCW = 31 Fick cardiac output/index = 2.8/1.6 SVR = 1823 PVR = 7.0 WU FA  sat = 90% PA sat = 34%, 42%  Assessment: 1. Mild non-obstructive CAD 2. Severe NICM with EF 25% by echo 3. Cardiogenic shock physiology with markedly elevated biventricular filling pressures Lexiscan 09/23/13: EF 50% normal perfusion Holter: SR occasional PVCs  Labs (9/14): digoxin 0.4, K 3.6, creatinine 0.62 Labs (9/15): K 4.4 creatinine 0.68 Labs (10/26):    K 3.7 Cr 0.67 Labs: (03/20/2013): K+ 4.6, Cr 0.73 Labs (09/25/13): K 4.2 Cr 0.9  PMH: 1. COPD: PFTs (1/14) with FEV1 68%.  Quit smoking 9/14.  2. Type II diabetes  3. HTN 4. Breast cancer: 2009, s/p Adriamycin/Cytoxan/Taxol chemo, lumpectomy and radiation.   5. Nonischemic cardiomyopathy: Possibly due to Adriamycin.  Echo (8/14) with EF 20-25%, global hypokinesis, moderate diastolic dysfunction, normal RV size and with mild to moderately decreased systolic function, moderate TR.   6. CAD: LHC (8/14) with nonobstructive CAD; 40-50% mLAD, 50-70% LCx, 50% PDA.  7. Hyperlipidemia  SH: Lives alone in [place].  Mother is next door.  Still smoking.   FH: No premature CAD.  No family history of cardiomyopathy.   ROS: All systems reviewed and negative except as per HPI.   Current Outpatient Prescriptions  Medication Sig Dispense Refill  . albuterol (PROVENTIL HFA;VENTOLIN HFA) 108 (90 BASE) MCG/ACT inhaler Inhale 2 puffs into the lungs every 6 (six) hours as needed for wheezing or shortness of breath. 1 Inhaler 0  . aspirin EC 81 MG tablet Take 1 tablet (81  mg total) by mouth daily. 90 tablet 3  . atorvastatin (LIPITOR) 20 MG tablet Take 1 tablet (20 mg total) by mouth daily. 90 tablet 2  . carvedilol (COREG) 3.125 MG tablet Take 1 tablet (3.125 mg total) by mouth 2 (two) times daily. 180 tablet 3  . Cholecalciferol (VITAMIN D) 2000 UNITS CAPS Take 1 capsule by mouth daily.    . digoxin (LANOXIN) 0.125 MG tablet Take 1 tablet (0.125 mg total) by mouth daily. 90 tablet 2  . furosemide (LASIX) 40 MG tablet Take 1 tablet (40 mg  total) by mouth daily. 90 tablet 2  . gabapentin (NEURONTIN) 400 MG capsule Take 2 capsules (800 mg total) by mouth 3 (three) times daily. 180 capsule 2  . glipiZIDE (GLUCOTROL) 5 MG tablet Take 1 tablet (5 mg total) by mouth 2 (two) times daily before a meal. 60 tablet 2  . glucose blood test strip Test bid. 180 each 3  . letrozole (FEMARA) 2.5 MG tablet Take 1 tablet (2.5 mg total) by mouth every morning. 90 tablet 2  . loperamide (IMODIUM) 2 MG capsule Take 1 capsule (2 mg total) by mouth daily as needed for diarrhea or loose stools. 90 capsule 0  . LORazepam (ATIVAN) 1 MG tablet Take 2 mg by mouth at bedtime.     . magnesium oxide (MAG-OX) 400 MG tablet Take 1 tablet (400 mg total) by mouth 2 (two) times daily. 60 tablet 5  . Melatonin 10 MG TABS Take 10 mg by mouth at bedtime.    . metFORMIN (GLUCOPHAGE) 1000 MG tablet Take 1 tablet (1,000 mg total) by mouth 2 (two) times daily with a meal. 180 tablet 1  . sacubitril-valsartan (ENTRESTO) 97-103 MG Take 1 tablet by mouth 2 (two) times daily. 180 tablet 3  . senna (SENOKOT) 8.6 MG tablet Take 1 tablet by mouth daily.    Marland Kitchen spironolactone (ALDACTONE) 25 MG tablet Take 1 tablet (25 mg total) by mouth daily. 90 tablet 2  . venlafaxine XR (EFFEXOR-XR) 150 MG 24 hr capsule Take 150 mg by mouth daily with breakfast.     No current facility-administered medications for this encounter.     Vitals:   10/15/15 1352  BP: 114/70  BP Location: Left Arm  Patient Position: Sitting  Cuff Size: Normal  Pulse: 92  SpO2: 96%  Weight: 159 lb (72.1 kg)    General: NAD Neck: JVP flat, no thyromegaly or thyroid nodule.  Lungs: Clear to auscultation bilaterally with normal respiratory effort. Decreased BS throughout.  CV: Laterally displaced PMI.  Heart regular S1/S2, no S3/S4, no murmur.  No edema.  No carotid bruit.  Normal pedal pulses Abdomen: Soft, nontender, no hepatosplenomegaly, no distention. R subclavian scar Skin: Intact without lesions or  rashes.  Neurologic: Alert and oriented x 3.  Psych: Normal affect. Extremities: No clubbing or cyanosis. DPs 1+ bilateral  HEENT: Normal.   Assessment/Plan: 1. Chronic systolic CHF: NICM, likely Adriamycin toxicity,. EF had previously recovered but now way back down  20% - Admitted for cardiogenic shock 5/17. Required milrinone. Now off.  - Improved now NYHA II. Volume status ok  - Continue Digoxin 0.125mg  daily, Entresto 97/103, spiro 25, lasix 40 daily - Increase carvedilol to 6.25 bid - Potassium high on last labs. She will cut back on no-salt - Echo at next visit. 2. CAD: Moderate nonobstructive disease. -Continue ASA and statin 3. COPD/tobacco use - has cut way back. Trying to quit with patch. Interested in [place]. Will try when  she gets back.  4. Cocaine use- says she has quit  5. Breast Cancer: received adriamycin. Cancer free for > 5 years. 6. Carotid stenosis: asymptomatic. Will get yearly u/s. Continue asa and statin.  7. R chest leiomyosarcoma: -has had resection but margins were not clear. Has seen Dr. Alen Blew who will present at tumor board.  -has been referred to Dr. Barry Dienes. Will repeat echo at next visit to see if her heart has improved enough for surgery.  8. Palpitations:  --resolved. 30-day even monitor from July reviewed in clinic. No events.   Tamas Suen,MD 2:27 PM

## 2015-10-15 NOTE — Addendum Note (Signed)
Encounter addended by: Scarlette Calico, RN on: 10/15/2015  2:45 PM<BR>    Actions taken: Sign clinical note, Order Entry activity accessed, Diagnosis association updated

## 2015-10-15 NOTE — Patient Instructions (Signed)
Labs today  Your physician recommends that you schedule a follow-up appointment in: 2 months with echocardiogram  

## 2015-10-18 ENCOUNTER — Telehealth (HOSPITAL_COMMUNITY): Payer: Self-pay | Admitting: *Deleted

## 2015-10-18 MED ORDER — CARVEDILOL 6.25 MG PO TABS: 6.2500 mg | ORAL_TABLET | Freq: Two times a day (BID) | ORAL | 3 refills | Status: DC

## 2015-10-18 NOTE — Telephone Encounter (Signed)
Pt left VM earlier this AM asking about increasing Carvedilol dose, no new rx hasd been sent in.  Per OV note from 8/18: - Increase carvedilol to 6.25 bid  Returned call to pt, advised she was suppose to increase carvedilol, new rx sent in.

## 2015-10-19 ENCOUNTER — Ambulatory Visit: Payer: Medicare Other | Admitting: Family Medicine

## 2015-10-20 ENCOUNTER — Encounter: Payer: Self-pay | Admitting: Family Medicine

## 2015-10-20 ENCOUNTER — Ambulatory Visit (INDEPENDENT_AMBULATORY_CARE_PROVIDER_SITE_OTHER): Payer: Medicare Other | Admitting: Family Medicine

## 2015-10-20 VITALS — BP 128/81 | HR 89 | Temp 98.2°F | Resp 20 | Wt 158.0 lb

## 2015-10-20 DIAGNOSIS — G4700 Insomnia, unspecified: Secondary | ICD-10-CM | POA: Diagnosis not present

## 2015-10-20 DIAGNOSIS — C499 Malignant neoplasm of connective and soft tissue, unspecified: Secondary | ICD-10-CM | POA: Diagnosis not present

## 2015-10-20 DIAGNOSIS — IMO0001 Reserved for inherently not codable concepts without codable children: Secondary | ICD-10-CM

## 2015-10-20 DIAGNOSIS — E1165 Type 2 diabetes mellitus with hyperglycemia: Secondary | ICD-10-CM | POA: Diagnosis not present

## 2015-10-20 LAB — MICROALBUMIN / CREATININE URINE RATIO
Creatinine,U: 52.7 mg/dL
MICROALB/CREAT RATIO: 1.3 mg/g (ref 0.0–30.0)
Microalb, Ur: <0.7 mg/dL (ref 0.0–1.9)

## 2015-10-20 MED ORDER — LORAZEPAM 1 MG PO TABS: 1.0000 mg | ORAL_TABLET | Freq: Every day | ORAL | 0 refills | Status: DC

## 2015-10-20 MED ORDER — GLIPIZIDE 10 MG PO TABS
10.0000 mg | ORAL_TABLET | Freq: Two times a day (BID) | ORAL | 0 refills | Status: DC
Start: 2015-10-20 — End: 2016-01-13
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
## 2015-10-20 NOTE — Patient Instructions (Addendum)
Ativan refilled x1, will need to discuss insomnia when you return. We will not refill this medication again without discussion.  When you return will want to refer you to pulmonology for lung function test. You will also need an appt for a1c, we can do this with your insomnia visit. This should be as soon as you return.  Start glipizide 10 mg in the morning and 5 mg afternoon for 5 days then increase to 10 mg twice a day if tolerating.

## 2015-10-21 ENCOUNTER — Telehealth: Payer: Self-pay | Admitting: Family Medicine

## 2015-10-21 NOTE — Telephone Encounter (Signed)
Please call pt: - her urine studies are normal.  - waiting on CMET, please make sure has this completed.

## 2015-10-22 ENCOUNTER — Other Ambulatory Visit: Payer: Self-pay | Admitting: General Surgery

## 2015-10-22 ENCOUNTER — Ambulatory Visit (HOSPITAL_COMMUNITY)
Admission: RE | Admit: 2015-10-22 | Discharge: 2015-10-22 | Disposition: A | Discharge status: Home / Self Care | Payer: Medicare Other | Source: Ambulatory Visit | Attending: Cardiology | Admitting: Cardiology

## 2015-10-22 ENCOUNTER — Telehealth: Payer: Self-pay | Admitting: Family Medicine

## 2015-10-22 DIAGNOSIS — C499 Malignant neoplasm of connective and soft tissue, unspecified: Secondary | ICD-10-CM | POA: Insufficient documentation

## 2015-10-22 DIAGNOSIS — E1165 Type 2 diabetes mellitus with hyperglycemia: Secondary | ICD-10-CM

## 2015-10-22 DIAGNOSIS — IMO0001 Reserved for inherently not codable concepts without codable children: Secondary | ICD-10-CM

## 2015-10-22 LAB — COMPREHENSIVE METABOLIC PANEL WITH GFR
ALT: 27 U/L (ref 14–54)
AST: 27 U/L (ref 15–41)
Albumin: 3.9 g/dL (ref 3.5–5.0)
Alkaline Phosphatase: 53 U/L (ref 38–126)
Anion gap: 13 (ref 5–15)
BUN: 19 mg/dL (ref 6–20)
CO2: 27 mmol/L (ref 22–32)
Calcium: 9.3 mg/dL (ref 8.9–10.3)
Chloride: 95 mmol/L — ABNORMAL LOW (ref 101–111)
Creatinine, Ser: 1.19 mg/dL — ABNORMAL HIGH (ref 0.44–1.00)
GFR calc Af Amer: 58 mL/min — ABNORMAL LOW
GFR calc non Af Amer: 50 mL/min — ABNORMAL LOW
Glucose, Bld: 228 mg/dL — ABNORMAL HIGH (ref 65–99)
Potassium: 4.2 mmol/L (ref 3.5–5.1)
Sodium: 135 mmol/L (ref 135–145)
Total Bilirubin: 0.6 mg/dL (ref 0.3–1.2)
Total Protein: 7.1 g/dL (ref 6.5–8.1)

## 2015-10-22 NOTE — Telephone Encounter (Signed)
Please call pt: - her kidney function is mildly improved, but still not her baseline. Please make sure she follows up after her cruise as we discussed.

## 2015-10-27 ENCOUNTER — Telehealth (HOSPITAL_COMMUNITY): Payer: Self-pay | Admitting: Pharmacist

## 2015-10-27 ENCOUNTER — Ambulatory Visit: Payer: Medicare Other | Admitting: Family Medicine

## 2015-10-27 NOTE — Telephone Encounter (Signed)
Received a fax from [facility] insurance that Ms. Angert filled enalapril on 8/27 along with Entresto on 7/27. I called [facility] who verified only filling Entresto 97/103 mg on 7/27 for 90 day supply and not filling enalapril since 2015. I also called Ms. Moxley who verified that she is only taking Entresto.   [name]. Velva Harman, PharmD, BCPS, CPP Clinical Pharmacist Pager: [phone] Phone: [phone] 10/27/2015 11:44 AM

## 2015-10-28 ENCOUNTER — Encounter: Payer: Self-pay | Admitting: *Deleted

## 2015-10-28 NOTE — Telephone Encounter (Signed)
Message sent in my chart

## 2015-11-12 ENCOUNTER — Encounter (HOSPITAL_COMMUNITY): Payer: Self-pay

## 2015-11-12 NOTE — Progress Notes (Signed)
Attending Physician's Statement compelted on behalf of patient for [employer] disability claim. Placed in Dr. Clayborne Dana folder for signature and review. Patient made aware.  Renee Pain, RN

## 2015-11-16 ENCOUNTER — Telehealth: Payer: Self-pay | Admitting: Family Medicine

## 2015-11-16 DIAGNOSIS — E119 Type 2 diabetes mellitus without complications: Secondary | ICD-10-CM

## 2015-11-16 NOTE — Telephone Encounter (Signed)
Patient would like to come in this afternoon for her flu shot & to have her A1C tested. Please call patient back when lab order is in EPIC. Thanks

## 2015-11-17 NOTE — Addendum Note (Signed)
Addended by: Leota Jacobsen on: 11/17/2015 10:13 AM   Modules accepted: Orders

## 2015-11-19 ENCOUNTER — Ambulatory Visit: Payer: Medicare Other

## 2015-11-19 ENCOUNTER — Ambulatory Visit (INDEPENDENT_AMBULATORY_CARE_PROVIDER_SITE_OTHER): Payer: Medicare Other | Admitting: Family Medicine

## 2015-11-19 ENCOUNTER — Encounter: Payer: Self-pay | Admitting: Family Medicine

## 2015-11-19 VITALS — BP 128/85 | HR 92 | Temp 98.1°F | Resp 20 | Ht 65.0 in | Wt 148.5 lb

## 2015-11-19 DIAGNOSIS — Z23 Encounter for immunization: Secondary | ICD-10-CM

## 2015-11-19 DIAGNOSIS — E119 Type 2 diabetes mellitus without complications: Secondary | ICD-10-CM

## 2015-11-19 LAB — HEMOGLOBIN A1C: HEMOGLOBIN A1C: 8.4 % — AB (ref 4.6–6.5)

## 2015-11-22 ENCOUNTER — Telehealth: Payer: Self-pay | Admitting: Family Medicine

## 2015-11-22 DIAGNOSIS — E1122 Type 2 diabetes mellitus with diabetic chronic kidney disease: Secondary | ICD-10-CM

## 2015-11-22 DIAGNOSIS — IMO0002 Reserved for concepts with insufficient information to code with codable children: Secondary | ICD-10-CM

## 2015-11-22 DIAGNOSIS — E1165 Type 2 diabetes mellitus with hyperglycemia: Principal | ICD-10-CM

## 2015-11-22 DIAGNOSIS — N183 Chronic kidney disease, stage 3 (moderate): Principal | ICD-10-CM

## 2015-11-22 NOTE — Telephone Encounter (Signed)
Spoke with patient reviewed lab result and information. Patient verbalized understanding.

## 2015-11-22 NOTE — Progress Notes (Signed)
error 

## 2015-11-22 NOTE — Telephone Encounter (Signed)
Please call pt: - her a1c is much higher than prior at 8.4. - She is currently on metformin and glipizide and max dose.  - with her other chronic conditions I feel she could benefit from endocrine management to gain better control of her diabetes.  I have placed this referral for her today.

## 2015-11-23 ENCOUNTER — Ambulatory Visit (INDEPENDENT_AMBULATORY_CARE_PROVIDER_SITE_OTHER): Payer: Medicare Other | Admitting: Family Medicine

## 2015-11-23 ENCOUNTER — Encounter: Payer: Self-pay | Admitting: Family Medicine

## 2015-11-23 ENCOUNTER — Telehealth: Payer: Self-pay | Admitting: Family Medicine

## 2015-11-23 VITALS — BP 135/82 | HR 108 | Temp 98.3°F | Resp 20 | Wt 155.8 lb

## 2015-11-23 DIAGNOSIS — G4700 Insomnia, unspecified: Secondary | ICD-10-CM

## 2015-11-23 MED ORDER — TRAZODONE HCL 50 MG PO TABS: 50.0000 mg | ORAL_TABLET | Freq: Every evening | ORAL | 3 refills | Status: DC | PRN

## 2015-11-23 MED ORDER — LORAZEPAM 1 MG PO TABS: 1.0000 mg | ORAL_TABLET | Freq: Every day | ORAL | 1 refills | Status: DC

## 2015-11-23 NOTE — Telephone Encounter (Signed)
Patient called to let you know that her Glucometer is a one touch.

## 2015-11-23 NOTE — Telephone Encounter (Signed)
Called patient back need clarification on which model of onetouch. Patient says she will call back with information.

## 2015-11-23 NOTE — Patient Instructions (Addendum)
1. Practice better sleep hygiene. Go to bed same time, wake at same time.  2. Taper back to ativan 1 mg nightly, while starting trazodone 50 mg. Take trazodone 1 hour before bed.  3. I refilled you ativan today to taper. Start 1 mg with trazodone, then in 1 week cut to 0.5 mg for 1 week, then every other day for 1 week, then stop.   Follow up four weeks  Call in with monitor name type.
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
## 2015-11-24 MED ORDER — BLOOD GLUCOSE METER KIT: PACK | 0 refills | Status: DC

## 2015-11-24 NOTE — Addendum Note (Signed)
Addended by: Leota Jacobsen on: 11/24/2015 02:08 PM   Modules accepted: Orders

## 2015-11-24 NOTE — Addendum Note (Signed)
Addended by: Leota Jacobsen on: 11/24/2015 02:04 PM   Modules accepted: Orders

## 2015-11-24 NOTE — Telephone Encounter (Signed)
Patient called back and said her glucometer is a onetouch ultra mini. Her current insurance does not cover this meter anymore. We will write a Rx for generic meter so patient can get covered meter from pharmacy.

## 2015-11-30 ENCOUNTER — Encounter: Payer: Self-pay | Admitting: Endocrinology

## 2015-12-01 ENCOUNTER — Ambulatory Visit (HOSPITAL_COMMUNITY)
Admission: RE | Admit: 2015-12-01 | Discharge: 2015-12-01 | Disposition: A | Discharge status: Home / Self Care | Payer: Medicare Other | Source: Ambulatory Visit | Attending: Family Medicine | Admitting: Family Medicine

## 2015-12-01 ENCOUNTER — Encounter (HOSPITAL_COMMUNITY): Payer: Self-pay | Admitting: Internal Medicine

## 2015-12-01 ENCOUNTER — Ambulatory Visit (HOSPITAL_BASED_OUTPATIENT_CLINIC_OR_DEPARTMENT_OTHER)
Admission: RE | Admit: 2015-12-01 | Discharge: 2015-12-01 | Disposition: A | Discharge status: Home / Self Care | Payer: Medicare Other | Source: Ambulatory Visit | Attending: Internal Medicine | Admitting: Internal Medicine

## 2015-12-01 VITALS — BP 104/62 | HR 88 | Wt 158.5 lb

## 2015-12-01 DIAGNOSIS — Z7982 Long term (current) use of aspirin: Secondary | ICD-10-CM | POA: Insufficient documentation

## 2015-12-01 DIAGNOSIS — Z853 Personal history of malignant neoplasm of breast: Secondary | ICD-10-CM | POA: Diagnosis not present

## 2015-12-01 DIAGNOSIS — I110 Hypertensive heart disease with heart failure: Secondary | ICD-10-CM | POA: Insufficient documentation

## 2015-12-01 DIAGNOSIS — Z01810 Encounter for preprocedural cardiovascular examination: Secondary | ICD-10-CM

## 2015-12-01 DIAGNOSIS — Z7984 Long term (current) use of oral hypoglycemic drugs: Secondary | ICD-10-CM | POA: Insufficient documentation

## 2015-12-01 DIAGNOSIS — E119 Type 2 diabetes mellitus without complications: Secondary | ICD-10-CM | POA: Insufficient documentation

## 2015-12-01 DIAGNOSIS — Z9049 Acquired absence of other specified parts of digestive tract: Secondary | ICD-10-CM | POA: Diagnosis not present

## 2015-12-01 DIAGNOSIS — J449 Chronic obstructive pulmonary disease, unspecified: Secondary | ICD-10-CM | POA: Insufficient documentation

## 2015-12-01 DIAGNOSIS — I5022 Chronic systolic (congestive) heart failure: Secondary | ICD-10-CM

## 2015-12-01 DIAGNOSIS — R002 Palpitations: Secondary | ICD-10-CM | POA: Diagnosis not present

## 2015-12-01 DIAGNOSIS — F17210 Nicotine dependence, cigarettes, uncomplicated: Secondary | ICD-10-CM | POA: Insufficient documentation

## 2015-12-01 DIAGNOSIS — I6529 Occlusion and stenosis of unspecified carotid artery: Secondary | ICD-10-CM | POA: Diagnosis not present

## 2015-12-01 DIAGNOSIS — I429 Cardiomyopathy, unspecified: Secondary | ICD-10-CM | POA: Diagnosis not present

## 2015-12-01 DIAGNOSIS — E785 Hyperlipidemia, unspecified: Secondary | ICD-10-CM | POA: Diagnosis not present

## 2015-12-01 DIAGNOSIS — I2510 Atherosclerotic heart disease of native coronary artery without angina pectoris: Secondary | ICD-10-CM | POA: Insufficient documentation

## 2015-12-01 NOTE — Patient Instructions (Addendum)
Stop Digoxin.  Follow up in 4 months.

## 2015-12-01 NOTE — Progress Notes (Signed)
  Echocardiogram 2D Echocardiogram has been performed.  Donata Clay 12/01/2015, 12:06 PM

## 2015-12-01 NOTE — Progress Notes (Signed)
Patient ID: Emily [name], female   DOB: 1960-06-11, 55 y.o.   MRN: [id number]  ADVANCED HF CLINIC NOTE  Patient ID: Emily [name], female   DOB: 01/12/1961, 55 y.o.   MRN: [id number] PCP: Dr Kathlen Mody Primary Cardiologist: Dr. Haroldine Laws  [name] is a 55 yo woman with a history of ER + R breast cancer s/p R lumpectomy, DM2, cholelithiasis s/p cholecystectomy (02/2013), COPD and systolic CHF due to nonischemic cardiomyopathy.  Patient was admitted in 09/2012 with acute pulmonary edema and cardiogenic shock.  LHC showed moderate nonobstructive CAD (40-50% mLAD, 50-70% LCx, 50% PDA).  She was started on milrinone and diuresed.  Cause of cardiomyopathy suspected to be Adriamycin toxicity.    ECHO 10/27/12 EF 20-25% ECHO 01/09/13 EF 30-35% ECHO 06/05/13 EF 45-50% septum dysynnergic Echo 5/17: EF 15-20%  12/05/12 Carotid Dopplers- R 39 % stenosis and L  40-59% stenosis  Had surgery 4/17 in [place] to remove R chest leiomyosarcoma. Margins not clear so needs larger resection. Saw Dr. Alen Blew who has ordered scans and has presented to Tumor Board. Pending appt with Dr. Barry Dienes 10/22/15.   She moved back from [place], [place] in May 2017 for recurrent HF symptome. Found to have EF 10-15% with NYHA IIIB-IV symptoms. Was admitted. UDS + cocaine. Cath showed non-obstructive CAD with Cardiogenic shock initial PA sat 34%, 42%. Started on milrinone. Diuresed about 5 pounds. Milrinone weaned off and co-ox dropped down to 52% but was feeling ok. Weight on d/c was 156.   Follow-up: Last visit carvedilol was increased to 6.25 mg twice a day. Overall feeling ok,. Having difficulty sleeping. PCP trying to get her off ativan. SOB with house work. Weight at home 155-161 pounds. On Saturday she took an extra lasix. Says the weight went back down. Taking all medications. Smoking 1 PPD.   Cath 07/14/15 Ao = 106/72 (87) LV = 108/28 (36) RA = 22 RV = 60/14/25 PA = 67/22 (51) PCW = 31 Fick cardiac output/index = 2.8/1.6 SVR  = 1823 PVR = 7.0 WU FA sat = 90% PA sat = 34%, 42%  Assessment: 1. Mild non-obstructive CAD 2. Severe NICM with EF 25% by echo 3. Cardiogenic shock physiology with markedly elevated biventricular filling pressures Lexiscan 09/23/13: EF 50% normal perfusion Holter: SR occasional PVCs  Labs (9/14): digoxin 0.4, K 3.6, creatinine 0.62 Labs (9/15): K 4.4 creatinine 0.68 Labs (10/26):    K 3.7 Cr 0.67 Labs: (03/20/2013): K+ 4.6, Cr 0.73 Labs (09/25/13): K 4.2 Cr 0.9  PMH: 1. COPD: PFTs (1/14) with FEV1 68%.  Quit smoking 9/14.  2. Type II diabetes  3. HTN 4. Breast cancer: 2009, s/p Adriamycin/Cytoxan/Taxol chemo, lumpectomy and radiation.   5. Nonischemic cardiomyopathy: Possibly due to Adriamycin.  Echo (8/14) with EF 20-25%, global hypokinesis, moderate diastolic dysfunction, normal RV size and with mild to moderately decreased systolic function, moderate TR.   6. CAD: LHC (8/14) with nonobstructive CAD; 40-50% mLAD, 50-70% LCx, 50% PDA.  7. Hyperlipidemia  SH: Lives alone in [place].  Mother is next door.  Still smoking.   FH: No premature CAD.  No family history of cardiomyopathy.   ROS: All systems reviewed and negative except as per HPI.   Current Outpatient Prescriptions  Medication Sig Dispense Refill  . albuterol (PROVENTIL HFA;VENTOLIN HFA) 108 (90 BASE) MCG/ACT inhaler Inhale 2 puffs into the lungs every 6 (six) hours as needed for wheezing or shortness of breath. 1 Inhaler 0  . aspirin EC 81 MG tablet Take  1 tablet (81 mg total) by mouth daily. 90 tablet 3  . atorvastatin (LIPITOR) 20 MG tablet Take 1 tablet (20 mg total) by mouth daily. 90 tablet 2  . blood glucose meter kit and supplies Check blood sugar fasting in the am and prior to each meal 1 each 0  . carvedilol (COREG) 6.25 MG tablet Take 1 tablet (6.25 mg total) by mouth 2 (two) times daily. 180 tablet 3  . Cholecalciferol (VITAMIN D) 2000 UNITS CAPS Take 1 capsule by mouth daily.    . digoxin (LANOXIN) 0.125  MG tablet Take 1 tablet (0.125 mg total) by mouth daily. 90 tablet 2  . furosemide (LASIX) 40 MG tablet Take 1 tablet (40 mg total) by mouth daily. 90 tablet 2  . gabapentin (NEURONTIN) 400 MG capsule Take 2 capsules (800 mg total) by mouth 3 (three) times daily. 180 capsule 2  . glipiZIDE (GLUCOTROL) 10 MG tablet Take 1 tablet (10 mg total) by mouth 2 (two) times daily before a meal. 180 tablet 0  . glucose blood test strip Test bid. 180 each 3  . letrozole (FEMARA) 2.5 MG tablet Take 1 tablet (2.5 mg total) by mouth every morning. 90 tablet 2  . loperamide (IMODIUM) 2 MG capsule Take 1 capsule (2 mg total) by mouth daily as needed for diarrhea or loose stools. 90 capsule 0  . LORazepam (ATIVAN) 1 MG tablet Take 1-2 tablets (1-2 mg total) by mouth at bedtime. 60 tablet 1  . magnesium oxide (MAG-OX) 400 MG tablet Take 1 tablet (400 mg total) by mouth 2 (two) times daily. 60 tablet 5  . Melatonin 10 MG TABS Take 10 mg by mouth at bedtime.    . metFORMIN (GLUCOPHAGE) 1000 MG tablet Take 1 tablet (1,000 mg total) by mouth 2 (two) times daily with a meal. 180 tablet 1  . sacubitril-valsartan (ENTRESTO) 97-103 MG Take 1 tablet by mouth 2 (two) times daily. 180 tablet 3  . senna (SENOKOT) 8.6 MG tablet Take 1 tablet by mouth daily.    Marland Kitchen spironolactone (ALDACTONE) 25 MG tablet Take 1 tablet (25 mg total) by mouth daily. 90 tablet 2  . traZODone (DESYREL) 50 MG tablet Take 1 tablet (50 mg total) by mouth at bedtime as needed for sleep. 30 tablet 3  . venlafaxine XR (EFFEXOR-XR) 150 MG 24 hr capsule Take 150 mg by mouth daily with breakfast.     No current facility-administered medications for this encounter.     Vitals:   12/01/15 1230  BP: 104/62  Pulse: 88  SpO2: 98%  Weight: 158 lb 8 oz (71.9 kg)    General: NAD Neck: JVP flat, no thyromegaly or thyroid nodule.  Lungs: Clear to auscultation bilaterally with normal respiratory effort. Decreased BS throughout.  CV: Laterally displaced PMI.   Heart regular S1/S2, no S3/S4, no murmur.  No edema.  No carotid bruit.  Normal pedal pulses Abdomen: Soft, nontender, no hepatosplenomegaly, no distention. R subclavian scar Skin: Intact without lesions or rashes.  Neurologic: Alert and oriented x 3.  Psych: Normal affect. Extremities: No clubbing or cyanosis. DPs 1+ bilateral  HEENT: Normal.   Assessment/Plan: 1. Chronic systolic CHF: NICM, likely Adriamycin toxicity,. EF had previously recovered but now way back down  20%.  - Admitted for cardiogenic shock 5/17. Required milrinone. Now off.  Todays ECHO has improved with EF recovery noted.  - Improved now NYHA II. Volume status ok  - Stop digoxin 0.164m daily,  -Continue Entresto 97/103 -Continue spiro  25 mg daily -Continue lasix 40 daily - Continue  carvedilol to 6.25 bid - 2. CAD: Moderate nonobstructive disease. -Continue ASA and statin 3. COPD/tobacco use - has cut way back. Trying to quit with patch. Interested in [place]. Will try when she gets back.  4. Cocaine use- says she has quit  5. Breast Cancer: received adriamycin. Cancer free for > 5 years. 6. Carotid stenosis: asymptomatic. Will get yearly u/s. Continue asa and statin.  7. R chest leiomyosarcoma: -has had resection but margins were not clear. Has seen Dr. Alen Blew who will present at tumor board.  -has been referred to Dr. Barry Dienes. Will repeat echo at next visit to see if her heart has improved enough for surgery.  8. Palpitations:  --resolved. 30-day even monitor from July reviewed in clinic. No events.    Follow up in 3 months  Amy Clegg, NP-C  12:34 PM  Patient seen and examined with Darrick Grinder, NP. We discussed all aspects of the encounter. I agree with the assessment and plan as stated above.   Echo today with normalization of LF function. Volume status looks good. NYHA I-II. Can stop digoxin. Can now proceed with surgery for leiomyosarcoma.  Marili Vader,MD 4:19 PM

## 2015-12-16 ENCOUNTER — Encounter (HOSPITAL_COMMUNITY): Payer: Self-pay | Admitting: Psychiatry

## 2015-12-16 ENCOUNTER — Ambulatory Visit (INDEPENDENT_AMBULATORY_CARE_PROVIDER_SITE_OTHER): Payer: Medicare Other | Admitting: Psychiatry

## 2015-12-16 VITALS — BP 142/73 | HR 96 | Ht 65.0 in | Wt 161.2 lb

## 2015-12-16 DIAGNOSIS — F419 Anxiety disorder, unspecified: Secondary | ICD-10-CM

## 2015-12-16 DIAGNOSIS — F321 Major depressive disorder, single episode, moderate: Secondary | ICD-10-CM

## 2015-12-16 MED ORDER — HYDROXYZINE PAMOATE 50 MG PO CAPS: 50.0000 mg | ORAL_CAPSULE | Freq: Every evening | ORAL | 1 refills | Status: DC | PRN

## 2015-12-16 MED ORDER — VENLAFAXINE HCL ER 75 MG PO CP24: 75.0000 mg | ORAL_CAPSULE | Freq: Every day | ORAL | 2 refills | Status: DC
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
## 2015-12-21 ENCOUNTER — Ambulatory Visit: Payer: Medicare Other | Admitting: Family Medicine

## 2015-12-23 ENCOUNTER — Encounter (HOSPITAL_COMMUNITY)
Admission: RE | Admit: 2015-12-23 | Discharge: 2015-12-23 | Disposition: A | Discharge status: Home / Self Care | Payer: Medicare Other | Source: Ambulatory Visit | Attending: General Surgery | Admitting: General Surgery

## 2015-12-23 ENCOUNTER — Encounter (HOSPITAL_COMMUNITY): Payer: Self-pay

## 2015-12-23 DIAGNOSIS — Z01812 Encounter for preprocedural laboratory examination: Secondary | ICD-10-CM | POA: Diagnosis not present

## 2015-12-23 DIAGNOSIS — C761 Malignant neoplasm of thorax: Secondary | ICD-10-CM | POA: Insufficient documentation

## 2015-12-23 HISTORY — DX: Constipation, unspecified: K59.00

## 2015-12-23 HISTORY — DX: Cardiogenic shock: R57.0

## 2015-12-23 HISTORY — DX: Polyneuropathy, unspecified: G62.9

## 2015-12-23 HISTORY — DX: Dyspnea, unspecified: R06.00

## 2015-12-23 LAB — CBC
HCT: 41.3 % (ref 36.0–46.0)
Hemoglobin: 13.7 g/dL (ref 12.0–15.0)
MCH: 30.6 pg (ref 26.0–34.0)
MCHC: 33.2 g/dL (ref 30.0–36.0)
MCV: 92.4 fL (ref 78.0–100.0)
Platelets: 206 10*3/uL (ref 150–400)
RBC: 4.47 MIL/uL (ref 3.87–5.11)
RDW: 13.1 % (ref 11.5–15.5)
WBC: 9.6 10*3/uL (ref 4.0–10.5)

## 2015-12-23 LAB — COMPREHENSIVE METABOLIC PANEL
ALK PHOS: 51 U/L (ref 38–126)
ALT: 26 U/L (ref 14–54)
ANION GAP: 10 (ref 5–15)
AST: 28 U/L (ref 15–41)
Albumin: 4.1 g/dL (ref 3.5–5.0)
BILIRUBIN TOTAL: 0.5 mg/dL (ref 0.3–1.2)
BUN: 19 mg/dL (ref 6–20)
CALCIUM: 9.3 mg/dL (ref 8.9–10.3)
CO2: 25 mmol/L (ref 22–32)
Chloride: 99 mmol/L — ABNORMAL LOW (ref 101–111)
Creatinine, Ser: 1.03 mg/dL — ABNORMAL HIGH (ref 0.44–1.00)
GFR calc non Af Amer: 59 mL/min — ABNORMAL LOW (ref >60)
Glucose, Bld: 165 mg/dL — ABNORMAL HIGH (ref 65–99)
Potassium: 4.8 mmol/L (ref 3.5–5.1)
Sodium: 134 mmol/L — ABNORMAL LOW (ref 135–145)
TOTAL PROTEIN: 7.3 g/dL (ref 6.5–8.1)

## 2015-12-23 LAB — GLUCOSE, CAPILLARY: GLUCOSE-CAPILLARY: 217 mg/dL — AB (ref 65–99)

## 2015-12-23 NOTE — Pre-Procedure Instructions (Signed)
ARSHIKA TAMS  12/23/2015     Your procedure is scheduled on Tuesday, December 18, 2015 at 2:30 PM.   Report to [facility] Entrance "A" Admitting Office at 12:30 PM.   Call this number if you have problems the morning of surgery: [phone]   Questions prior to day of surgery, please call [phone] between 8 & 4 PM..   Remember:  Do not eat food or drink liquids after midnight Monday, 12/27/15.  Take these medicines the morning of surgery with A SIP OF WATER: Carvedilol (Coreg), Gabapentin (Neurontin), Letrozole (Femara), Venlafaxine (Effexor), Albuterol inhaler - if needed (bring inhaler with you the day of surgery.  Stop Aspirin as of today.   How to Manage Your Diabetes Before Surgery   Why is it important to control my blood sugar before and after surgery?   Improving blood sugar levels before and after surgery helps healing and can limit problems.  A way of improving blood sugar control is eating a healthy diet by:  - Eating less sugar and carbohydrates  - Increasing activity/exercise  - Talk with your doctor about reaching your blood sugar goals  High blood sugars (greater than 180 mg/dL) can raise your risk of infections and slow down your recovery so you will need to focus on controlling your diabetes during the weeks before surgery.  Make sure that the doctor who takes care of your diabetes knows about your planned surgery including the date and location.  How do I manage my blood sugars before surgery?   Check your blood sugar at least 4 times a day, 2 days before surgery to make sure that they are not too high or low.  Check your blood sugar the morning of your surgery when you wake up and every 2 hours until you get to the Short-Stay unit.  Treat a low blood sugar (less than 70 mg/dL) with 1/2 cup of clear juice (cranberry or apple), 4 glucose tablets, OR glucose gel.  Recheck blood sugar in 15 minutes after treatment (to make sure it is  greater than 70 mg/dL).  If blood sugar is not greater than 70 mg/dL on re-check, call [phone] for further instructions.   Report your blood sugar to the Short-Stay nurse when you get to Short-Stay.  References:  [facility], 2007 "How to Manage your Diabetes Before and After Surgery".  What do I do about my diabetes medications?  Do not take your Glipizide the evening before surgery   Do not take oral diabetes medicines (pills) the morning of surgery.   Do not wear jewelry, make-up or nail polish.  Do not wear lotions, powders, or perfumes, or deodorant.  Do not shave 48 hours prior to surgery.    Do not bring valuables to the hospital.  [facility] is not responsible for any belongings or valuables.  Contacts, dentures or bridgework may not be worn into surgery.  Leave your suitcase in the car.  After surgery it may be brought to your room.  For patients admitted to the hospital, discharge time will be determined by your treatment team.  Patients discharged the day of surgery will not be allowed to drive home.   Special instructions:  [facility] - Preparing for Surgery  Before surgery, you can play an important role.  Because skin is not sterile, your skin needs to be as free of germs as possible.  You can reduce the number of germs on you skin by  washing with CHG (chlorahexidine gluconate) soap before surgery.  CHG is an antiseptic cleaner which kills germs and bonds with the skin to continue killing germs even after washing.  Please DO NOT use if you have an allergy to CHG or antibacterial soaps.  If your skin becomes reddened/irritated stop using the CHG and inform your nurse when you arrive at Short Stay.  Do not shave (including legs and underarms) for at least 48 hours prior to the first CHG shower.  You may shave your face.  Please follow these instructions carefully:   1.  Shower with CHG Soap the night before surgery and the                     morning of Surgery.  2.  If you choose to wash your hair, wash your hair first as usual with your       normal shampoo.  3.  After you shampoo, rinse your hair and body thoroughly to remove the shampoo.  4.  Use CHG as you would any other liquid soap.  You can apply chg directly       to the skin and wash gently with scrungie or a clean washcloth.  5.  Apply the CHG Soap to your body ONLY FROM THE NECK DOWN.        Do not use on open wounds or open sores.  Avoid contact with your eyes, ears, mouth and genitals (private parts).  Wash genitals (private parts) with your normal soap.  6.  Wash thoroughly, paying special attention to the area where your surgery        will be performed.  7.  Thoroughly rinse your body with warm water from the neck down.  8.  DO NOT shower/wash with your normal soap after using and rinsing off       the CHG Soap.  9.  Pat yourself dry with a clean towel.            10.  Wear clean pajamas.            11.  Place clean sheets on your bed the night of your first shower and do not        sleep with pets.  Day of Surgery  Do not apply any lotions/deodorants the morning of surgery.  Please wear clean clothes to the hospital.   Please read over the following fact sheets that you were given.

## 2015-12-23 NOTE — Progress Notes (Signed)
Pt has hx of CHF and is followed by Dr. Tempie Hoist. Pt denies any recent chest pain or sob. Pt is diabetic. States last A1C was 8.9 a month ago. States fasting blood sugar has been high recently 270-295. States she will watch her carb intake prior to surgery. Instructed pt not to smoke 24 hours prior to surgery.

## 2015-12-24 NOTE — Progress Notes (Signed)
Anesthesia Chart Review:  Patient is a 55 year old female scheduled for reexcision of right chest wall leiomyosarcoma on 12/28/2015 by Dr. Barry Dienes.   History includes smoking, systolic CHF due to non-ischemic cardiomyopathy (Adriamycin toxicity suspected cause; presented in cardiogenic shock) 09/2012, mild-moderate non-obstructive CAD, COPD, DOE, DM2, neuropathy, GERD, acute pancreatitis 11/2012, right breast cancer s/p lumpectomy '09 with chemoradiation, depression, anxiety, iron deficiency anemia, dyslipidemia, HTN, Hepatitis "E" in the 1970's, RSD RLE, psoriasis, osteoporosis, T&A, hysterectomy, open cholecystectomy 03/06/13, right chest wall leiomyosarcoma resection with positive margins 05/2015 [facility]). [place], [place] in 06/2015 with recurrent HF syndrome and cardiogenic shock, EF 25-30% with NYHA IIIB-IV symptoms. UDS positive for cocaine. Cath showed non-obstructive CAD. She was treated with diuresis and Milrinone. EF recovered by 11/2015.  - PCP is Dr. [name] with [facility], last visit 11/23/15.   - Cardiologist is Dr. Haroldine Laws, last visit 12/01/15. Patient is now off milirone. Echo repeated and showed normalization of LVF. Digoxin stopped. She denied cocaine use. He wrote, "Can now proceed with surgery for leiomyosarcoma."  Meds include albuterol, aspirin 81 mg, Lipitor, Coreg, Lasix, Neurontin, glipizide, hydroxyzine, Climara, Mag-ox, melatonin, metformin, Entresto, Aldactone, Effexor XR.  BP 118/68   Pulse 95   Temp 36.9 C   Resp 20   Ht 5\' 5"  (1.651 m)   Wt 157 lb 9.6 oz (71.5 kg)   SpO2 95%   BMI 26.23 kg/m   12/01/15 Echo: Study Conclusions - Left ventricle: The cavity size was normal. Wall thickness was   normal. Systolic function was normal. The estimated ejection   fraction was in the range of 55% to 60%. Wall motion was normal;   there were no regional wall motion abnormalities. Doppler   parameters are consistent with abnormal left  ventricular   relaxation (grade 1 diastolic dysfunction). - Left atrium: The atrium was mildly dilated. (Previous EF: 25-30% 07/08/15,   07/14/15 RHC/LHC:  1st Mrg lesion, 40% stenosed.  Mid LAD lesion, 40% stenosed.  Prox RCA to Mid RCA lesion, 30% stenosed.  Mid RCA to Dist RCA lesion, 30% stenosed. Ao = 106/72 (87) LV = 108/28 (36) RA = 22 RV = 60/14/25 PA = 67/22 (51) PCW = 31 Fick cardiac output/index = 2.8/1.6 SVR = 1823 PVR = 7.0 WU FA sat = 90% PA sat = 34%, 42% Assessment: 1. Mild non-obstructive CAD 2. Severe NICM with EF 25% by echo 3. Cardiogenic shock physiology with markedly elevated biventricular filling pressures Lexiscan 09/23/13: EF 50% normal perfusion Holter: SR occasional PVCs  09/01/15-09/30/15 Event monitor: Average heart weight was 94 bpm. Tachycardia was present 28% of the readable data; bradycardia was present for 0% of the readable data. No pauses noted of 3 seconds or longer. One manually triggered recording posted.  07/19/15 EKG: ST at 109 bpm, biatrial enlargement, non-specific T wave abnormality.  11/13/13 Carotid Dopplers: Stable carotid artery disease. 1-39% our ICA stenosis. 123456 LICA stenosis. Patent vertebral arteries with antegrade flow. Normal subclavian arteries, bilaterally.  09/16/15 CT Chest with contrast: IMPRESSION: 1. No evidence of a residual or recurrent mass in the chest wall. 2. No evidence of metastatic disease in the chest. 3. Diffuse ground-glass centrilobular micronodularity throughout both lungs, upper lobe predominant, unchanged back to 2013. If the patient is a current smoker, this finding is most suggestive of smoking related interstitial lung disease. If the patient is not a current smoker, this finding could indicate subacute hypersensitivity pneumonitis or chronic postinflammatory nodularity.  4. Stable mild radiation fibrosis in the anterior mid to upper right lung. 5. Aortic atherosclerosis.  One vessel coronary  atherosclerosis.  03/18/12 PFTs: FVC 2.47 (68%), FEV1 1. 93 (68%), DLCO 22.15 (86%).  No significant response to BD.  Preoperative labs noted. Cr 1.03, glucose 165. CBC WNL. A1c 8.4 on 11/19/15, consistent with average glucose of 194--however, she told her PAT RN that home fasting CBGs have been running 270-295. She apparently eats later in the day. She will watch her carb intake between now and surgery. Surgery is not until 2:30PM in the afternoon. PAT RN did discuss day of surgery glucose goals with patient. I also routed glucose information to Dr. Barry Dienes. If procedure remains as scheduled then she will get a fasting CBG on arrival and treatment for hyperglycemia as indicated. If glucose > 250, surgery could be delayed.   George [facility] Short Stay Center/Anesthesiology Phone [phone] 12/24/2015 10:53 AM

## 2015-12-28 ENCOUNTER — Ambulatory Visit (HOSPITAL_COMMUNITY): Payer: Medicare Other | Admitting: Vascular Surgery

## 2015-12-28 ENCOUNTER — Ambulatory Visit (HOSPITAL_COMMUNITY): Payer: Medicare Other | Admitting: Anesthesiology

## 2015-12-28 ENCOUNTER — Encounter (HOSPITAL_COMMUNITY)
Admission: RE | Disposition: A | Discharge status: Home / Self Care | Payer: Self-pay | Source: Ambulatory Visit | Attending: General Surgery

## 2015-12-28 ENCOUNTER — Encounter (HOSPITAL_COMMUNITY): Payer: Self-pay | Admitting: *Deleted

## 2015-12-28 ENCOUNTER — Ambulatory Visit (HOSPITAL_COMMUNITY)
Admission: RE | Admit: 2015-12-28 | Discharge: 2015-12-28 | Disposition: A | Discharge status: Home / Self Care | Payer: Medicare Other | Source: Ambulatory Visit | Attending: General Surgery | Admitting: General Surgery

## 2015-12-28 DIAGNOSIS — I110 Hypertensive heart disease with heart failure: Secondary | ICD-10-CM | POA: Insufficient documentation

## 2015-12-28 DIAGNOSIS — I700 Atherosclerosis of aorta: Secondary | ICD-10-CM | POA: Insufficient documentation

## 2015-12-28 DIAGNOSIS — Z7982 Long term (current) use of aspirin: Secondary | ICD-10-CM | POA: Insufficient documentation

## 2015-12-28 DIAGNOSIS — I5022 Chronic systolic (congestive) heart failure: Secondary | ICD-10-CM | POA: Insufficient documentation

## 2015-12-28 DIAGNOSIS — I2510 Atherosclerotic heart disease of native coronary artery without angina pectoris: Secondary | ICD-10-CM | POA: Diagnosis not present

## 2015-12-28 DIAGNOSIS — L578 Other skin changes due to chronic exposure to nonionizing radiation: Secondary | ICD-10-CM | POA: Insufficient documentation

## 2015-12-28 DIAGNOSIS — Z888 Allergy status to other drugs, medicaments and biological substances status: Secondary | ICD-10-CM | POA: Diagnosis not present

## 2015-12-28 DIAGNOSIS — E7800 Pure hypercholesterolemia, unspecified: Secondary | ICD-10-CM | POA: Diagnosis not present

## 2015-12-28 DIAGNOSIS — K219 Gastro-esophageal reflux disease without esophagitis: Secondary | ICD-10-CM | POA: Diagnosis not present

## 2015-12-28 DIAGNOSIS — C493 Malignant neoplasm of connective and soft tissue of thorax: Secondary | ICD-10-CM | POA: Diagnosis not present

## 2015-12-28 DIAGNOSIS — Z90710 Acquired absence of both cervix and uterus: Secondary | ICD-10-CM | POA: Insufficient documentation

## 2015-12-28 DIAGNOSIS — F329 Major depressive disorder, single episode, unspecified: Secondary | ICD-10-CM | POA: Insufficient documentation

## 2015-12-28 DIAGNOSIS — K759 Inflammatory liver disease, unspecified: Secondary | ICD-10-CM | POA: Diagnosis not present

## 2015-12-28 DIAGNOSIS — Z91041 Radiographic dye allergy status: Secondary | ICD-10-CM | POA: Insufficient documentation

## 2015-12-28 DIAGNOSIS — Z853 Personal history of malignant neoplasm of breast: Secondary | ICD-10-CM | POA: Diagnosis not present

## 2015-12-28 DIAGNOSIS — F419 Anxiety disorder, unspecified: Secondary | ICD-10-CM | POA: Diagnosis not present

## 2015-12-28 DIAGNOSIS — M1990 Unspecified osteoarthritis, unspecified site: Secondary | ICD-10-CM | POA: Diagnosis not present

## 2015-12-28 HISTORY — PX: MASS EXCISION: SHX2000

## 2015-12-28 LAB — GLUCOSE, CAPILLARY
GLUCOSE-CAPILLARY: 156 mg/dL — AB (ref 65–99)
Glucose-Capillary: 241 mg/dL — ABNORMAL HIGH (ref 65–99)

## 2015-12-28 SURGERY — EXCISION MASS
Anesthesia: General | Site: Chest | Laterality: Right

## 2015-12-28 MED ORDER — CEFAZOLIN SODIUM-DEXTROSE 2-4 GM/100ML-% IV SOLN
INTRAVENOUS | Status: AC
  Filled 2015-12-28: qty 100

## 2015-12-28 MED ORDER — CEFAZOLIN SODIUM-DEXTROSE 2-4 GM/100ML-% IV SOLN
2.0000 g | INTRAVENOUS | Status: AC
  Administered 2015-12-28: 2 g via INTRAVENOUS

## 2015-12-28 MED ORDER — ONDANSETRON HCL 4 MG/2ML IJ SOLN
INTRAMUSCULAR | Status: DC | PRN
  Administered 2015-12-28: 4 mg via INTRAVENOUS

## 2015-12-28 MED ORDER — 0.9 % SODIUM CHLORIDE (POUR BTL) OPTIME
TOPICAL | Status: DC | PRN
  Administered 2015-12-28: 1000 mL

## 2015-12-28 MED ORDER — SUGAMMADEX SODIUM 200 MG/2ML IV SOLN
INTRAVENOUS | Status: DC | PRN
  Administered 2015-12-28: 150 mg via INTRAVENOUS

## 2015-12-28 MED ORDER — OXYCODONE HCL 5 MG PO TABS
ORAL_TABLET | ORAL | Status: AC
  Filled 2015-12-28: qty 2

## 2015-12-28 MED ORDER — LACTATED RINGERS IV SOLN
INTRAVENOUS | Status: DC
  Administered 2015-12-28 (×2): via INTRAVENOUS

## 2015-12-28 MED ORDER — PHENYLEPHRINE HCL 10 MG/ML IJ SOLN
INTRAMUSCULAR | Status: DC | PRN
  Administered 2015-12-28: 40 ug via INTRAVENOUS
  Administered 2015-12-28: 120 ug via INTRAVENOUS
  Administered 2015-12-28 (×2): 40 ug via INTRAVENOUS

## 2015-12-28 MED ORDER — FENTANYL CITRATE (PF) 100 MCG/2ML IJ SOLN
INTRAMUSCULAR | Status: AC
  Filled 2015-12-28: qty 2

## 2015-12-28 MED ORDER — SODIUM CHLORIDE 0.9% FLUSH: 3.0000 mL | Freq: Two times a day (BID) | INTRAVENOUS | Status: DC

## 2015-12-28 MED ORDER — BUPIVACAINE LIPOSOME 1.3 % IJ SUSP
20.0000 mL | INTRAMUSCULAR | Status: AC
  Administered 2015-12-28: 20 mL
  Filled 2015-12-28: qty 20

## 2015-12-28 MED ORDER — SODIUM CHLORIDE 0.9% FLUSH: 3.0000 mL | INTRAVENOUS | Status: DC | PRN

## 2015-12-28 MED ORDER — LIDOCAINE HCL (CARDIAC) 20 MG/ML IV SOLN
INTRAVENOUS | Status: DC | PRN
  Administered 2015-12-28: 80 mg via INTRAVENOUS

## 2015-12-28 MED ORDER — CHLORHEXIDINE GLUCONATE CLOTH 2 % EX PADS: 6.0000 | MEDICATED_PAD | Freq: Once | CUTANEOUS | Status: DC

## 2015-12-28 MED ORDER — FENTANYL CITRATE (PF) 100 MCG/2ML IJ SOLN
INTRAMUSCULAR | Status: DC | PRN
  Administered 2015-12-28: 100 ug via INTRAVENOUS
  Administered 2015-12-28 (×2): 50 ug via INTRAVENOUS

## 2015-12-28 MED ORDER — ROCURONIUM BROMIDE 100 MG/10ML IV SOLN
INTRAVENOUS | Status: DC | PRN
  Administered 2015-12-28: 40 mg via INTRAVENOUS

## 2015-12-28 MED ORDER — SODIUM CHLORIDE 0.9 % IV SOLN: 250.0000 mL | INTRAVENOUS | Status: DC | PRN

## 2015-12-28 MED ORDER — ACETAMINOPHEN 325 MG PO TABS
650.0000 mg | ORAL_TABLET | ORAL | Status: DC | PRN
  Filled 2015-12-28: qty 2

## 2015-12-28 MED ORDER — LIDOCAINE-EPINEPHRINE (PF) 1 %-1:200000 IJ SOLN
INTRAMUSCULAR | Status: AC
  Filled 2015-12-28: qty 30

## 2015-12-28 MED ORDER — MIDAZOLAM HCL 2 MG/2ML IJ SOLN
INTRAMUSCULAR | Status: AC
  Filled 2015-12-28: qty 2

## 2015-12-28 MED ORDER — OXYCODONE HCL 5 MG PO TABS
5.0000 mg | ORAL_TABLET | ORAL | Status: DC | PRN
  Administered 2015-12-28: 10 mg via ORAL

## 2015-12-28 MED ORDER — PROPOFOL 10 MG/ML IV BOLUS
INTRAVENOUS | Status: DC | PRN
  Administered 2015-12-28: 130 mg via INTRAVENOUS

## 2015-12-28 MED ORDER — EPHEDRINE SULFATE 50 MG/ML IJ SOLN
INTRAMUSCULAR | Status: DC | PRN
  Administered 2015-12-28: 5 mg via INTRAVENOUS

## 2015-12-28 MED ORDER — ALBUTEROL SULFATE HFA 108 (90 BASE) MCG/ACT IN AERS
INHALATION_SPRAY | RESPIRATORY_TRACT | Status: DC | PRN
  Administered 2015-12-28 (×3): 2 via RESPIRATORY_TRACT

## 2015-12-28 MED ORDER — ACETAMINOPHEN 650 MG RE SUPP
650.0000 mg | RECTAL | Status: DC | PRN
  Filled 2015-12-28: qty 1

## 2015-12-28 MED ORDER — OXYCODONE HCL 5 MG PO TABS: 5.0000 mg | ORAL_TABLET | Freq: Four times a day (QID) | ORAL | 0 refills | Status: DC | PRN

## 2015-12-28 MED ORDER — BUPIVACAINE HCL (PF) 0.25 % IJ SOLN
INTRAMUSCULAR | Status: AC
  Filled 2015-12-28: qty 30

## 2015-12-28 MED ORDER — MIDAZOLAM HCL 5 MG/5ML IJ SOLN
INTRAMUSCULAR | Status: DC | PRN
  Administered 2015-12-28: 2 mg via INTRAVENOUS

## 2015-12-28 MED ORDER — PROPOFOL 10 MG/ML IV BOLUS
INTRAVENOUS | Status: AC
  Filled 2015-12-28: qty 20

## 2015-12-28 SURGICAL SUPPLY — 48 items
ADH SKN CLS APL DERMABOND .7 (GAUZE/BANDAGES/DRESSINGS) ×1
APL SKNCLS STERI-STRIP NONHPOA (GAUZE/BANDAGES/DRESSINGS) ×1
BENZOIN TINCTURE PRP APPL 2/3 (GAUZE/BANDAGES/DRESSINGS) ×2 IMPLANT
BLADE SURG 15 STRL LF DISP TIS (BLADE) ×1 IMPLANT
BLADE SURG 15 STRL SS (BLADE) ×2
CANISTER SUCTION 2500CC (MISCELLANEOUS) ×2 IMPLANT
CHLORAPREP W/TINT 26ML (MISCELLANEOUS) ×2 IMPLANT
COVER SURGICAL LIGHT HANDLE (MISCELLANEOUS) ×2 IMPLANT
DERMABOND ADVANCED (GAUZE/BANDAGES/DRESSINGS) ×1
DERMABOND ADVANCED .7 DNX12 (GAUZE/BANDAGES/DRESSINGS) IMPLANT
DRAPE CHEST BREAST 15X10 FENES (DRAPES) ×2 IMPLANT
DRAPE UTILITY XL STRL (DRAPES) ×2 IMPLANT
DRSG TEGADERM 4X4.75 (GAUZE/BANDAGES/DRESSINGS) ×2 IMPLANT
ELECT CAUTERY BLADE 6.4 (BLADE) ×2 IMPLANT
ELECT REM PT RETURN 9FT ADLT (ELECTROSURGICAL) ×2
ELECTRODE REM PT RTRN 9FT ADLT (ELECTROSURGICAL) ×1 IMPLANT
GAUZE SPONGE 4X4 12PLY STRL (GAUZE/BANDAGES/DRESSINGS) ×2 IMPLANT
GAUZE SPONGE 4X4 16PLY XRAY LF (GAUZE/BANDAGES/DRESSINGS) ×2 IMPLANT
GLOVE BIO SURGEON STRL SZ 6 (GLOVE) ×4 IMPLANT
GLOVE BIOGEL PI IND STRL 6.5 (GLOVE) ×1 IMPLANT
GLOVE BIOGEL PI INDICATOR 6.5 (GLOVE) ×1
GOWN STRL REUS W/ TWL LRG LVL3 (GOWN DISPOSABLE) ×1 IMPLANT
GOWN STRL REUS W/TWL 2XL LVL3 (GOWN DISPOSABLE) ×2 IMPLANT
GOWN STRL REUS W/TWL LRG LVL3 (GOWN DISPOSABLE) ×2
KIT BASIN OR (CUSTOM PROCEDURE TRAY) ×2 IMPLANT
KIT ROOM TURNOVER OR (KITS) ×2 IMPLANT
NDL HYPO 25GX1X1/2 BEV (NEEDLE) ×1 IMPLANT
NEEDLE HYPO 25GX1X1/2 BEV (NEEDLE) ×2 IMPLANT
NS IRRIG 1000ML POUR BTL (IV SOLUTION) ×2 IMPLANT
PACK SURGICAL SETUP 50X90 (CUSTOM PROCEDURE TRAY) ×2 IMPLANT
PAD ARMBOARD 7.5X6 YLW CONV (MISCELLANEOUS) ×4 IMPLANT
PENCIL BUTTON HOLSTER BLD 10FT (ELECTRODE) ×2 IMPLANT
SPECIMEN JAR SMALL (MISCELLANEOUS) ×2 IMPLANT
SPONGE LAP 18X18 X RAY DECT (DISPOSABLE) ×2 IMPLANT
STRIP CLOSURE SKIN 1/2X4 (GAUZE/BANDAGES/DRESSINGS) ×1 IMPLANT
SUT ETHILON 2 0 FS 18 (SUTURE) ×1 IMPLANT
SUT MON AB 4-0 PC3 18 (SUTURE) ×2 IMPLANT
SUT SILK 2 0 FS (SUTURE) ×1 IMPLANT
SUT VIC AB 2-0 CT1 27 (SUTURE) ×4
SUT VIC AB 2-0 CT1 TAPERPNT 27 (SUTURE) IMPLANT
SUT VIC AB 3-0 SH 27 (SUTURE) ×4
SUT VIC AB 3-0 SH 27X BRD (SUTURE) ×1 IMPLANT
SYR BULB 3OZ (MISCELLANEOUS) ×2 IMPLANT
SYR CONTROL 10ML LL (SYRINGE) ×2 IMPLANT
TOWEL OR 17X24 6PK STRL BLUE (TOWEL DISPOSABLE) ×2 IMPLANT
TOWEL OR 17X26 10 PK STRL BLUE (TOWEL DISPOSABLE) ×2 IMPLANT
TUBE CONNECTING 12X1/4 (SUCTIONS) ×1 IMPLANT
YANKAUER SUCT BULB TIP NO VENT (SUCTIONS) IMPLANT

## 2015-12-28 NOTE — Op Note (Signed)
PRE-OPERATIVE DIAGNOSIS: right chest wall sarcoma, s/p excision with microscopically positive margins  POST-OPERATIVE DIAGNOSIS:  Same  PROCEDURE:  Procedure(s): Reexcision of right chest wall sarcoma  SURGEON:  Surgeon(s): Stark Klein, MD  ANESTHESIA:   local and general  DRAINS: none   LOCAL MEDICATIONS USED:  OTHER exparel  SPECIMEN:  Source of Specimen:  reexcision right chest wall sarcoma  DISPOSITION OF SPECIMEN:  PATHOLOGY  COUNTS:  YES  DICTATION: .Dragon Dictation  PLAN OF CARE: Discharge to home after PACU  PATIENT DISPOSITION:  PACU - hemodynamically stable.  FINDINGS:  No gross recurrence evident  EBL: min  PROCEDURE:  Pt was identified in the holding area and taken to the OR where she was placed supine on the OR table.  General anesthesia was induced.  Her right arm was tucked.  The right clavicle area was prepped and draped in sterile fashion.  Timeout was performed according to the surgical safety checklist.  When all was correct, we continued.    The prior incision was excised in an elliptical fashion with around 1 cm margin around the incision.  The dissection was taken down through the soft tissue to the pectoralis and the clavicle.  The pectoralis fascia was taken.  The clavicle was scraped with the periosteal elevator.  The specimen was marked.    The cavity was irrigated and hemostasis was achieved with the cautery.  The incision was closed in layers with interrupted 2-0 vicryl, interrupted 3-0 vicryl, running 4-0 monocryl, and three interrupted 2-0 nylons.    The wound was cleaned, dried, and dressed with benzoin, steristrips, gauze, and tegaderm.    Needle, sponge, and instrument counts were correct x 2.  The patient emerged from anesthesia and was taken to the pacu in stable condition.

## 2015-12-28 NOTE — Discharge Instructions (Addendum)
[facility] Office Phone Number [phone]   POST OP INSTRUCTIONS  Always review your discharge instruction sheet given to you by the facility where your surgery was performed.  IF YOU HAVE DISABILITY OR FAMILY LEAVE FORMS, YOU MUST BRING THEM TO THE OFFICE FOR PROCESSING.  DO NOT GIVE THEM TO YOUR DOCTOR.  1. A prescription for pain medication may be given to you upon discharge.  Take your pain medication as prescribed, if needed.  If narcotic pain medicine is not needed, then you may take acetaminophen (Tylenol) or ibuprofen (Advil) as needed. 2. Take your usually prescribed medications unless otherwise directed 3. If you need a refill on your pain medication, please contact your pharmacy.  They will contact our office to request authorization.  Prescriptions will not be filled after 5pm or on week-ends. 4. You should eat very light the first 24 hours after surgery, such as soup, crackers, pudding, etc.  Resume your normal diet the day after surgery 5. It is common to experience some constipation if taking pain medication after surgery.  Increasing fluid intake and taking a stool softener will usually help or prevent this problem from occurring.  A mild laxative (Milk of Magnesia or Miralax) should be taken according to package directions if there are no bowel movements after 48 hours. 6. You may shower in 48 hours.  The surgical glue will flake off in 2-3 weeks.   7. ACTIVITIES:  No strenuous activity or heavy lifting for 2 week.   a. You may drive when you no longer are taking prescription pain medication, you can comfortably wear a seatbelt, and you can safely maneuver your car and apply brakes. b. RETURN TO WORK:  __________2 weeks_______________ Emily [name] should see your doctor in the office for a follow-up appointment approximately three-four weeks after your surgery.    WHEN TO CALL YOUR DOCTOR: 1. Fever over 101.0 2. Nausea and/or vomiting. 3. Extreme swelling or  bruising. 4. Continued bleeding from incision. 5. Increased pain, redness, or drainage from the incision.  The clinic staff is available to answer your questions during regular business hours.  Please dont hesitate to call and ask to speak to one of the nurses for clinical concerns.  If you have a medical emergency, go to the nearest emergency room or call 911.  A surgeon from [facility] is always on call at the hospital.  For further questions, please visit [email or web address]

## 2015-12-28 NOTE — H&P (Signed)
Emily [name]. Emily [name]  Location: [facility] Patient #: [id number] DOB: 02-Jan-1961 Divorced / Language: [name] / Race: White Female   History of Present Illness The patient is a 55 year old female who presents with a diagnosis of cancer. Pt is a 55 yo F who is referred by Dr. Alen [name] for consultation for a right chest wall sarcoma. she has history of right breast cancer and had lumpectomy/SLN/radiation in 2009. She is on 10 years of antihormone tx. She had a mass noted just below the right clavicle that was excised 06/27/2015 at [name] in [facility] by Dr. Su [name]. The deep margin was positive. She periodically spends a good portion of the summer in [place] with friends. She was referred to Dr. Alen [name] this summer. There is no evidence of metastatic disease on staging scans, but we discussed the positive margin at tumor board and it was felt that she may benefit from reexcision. She is not a candidate for repeat radiation in this area given the amount of radiation dosing she received with her breast cancer. She does still have some tightness and soreness in this region. There was no residual mass there on clinical exam with her surgeon in [place] or on the skin. She does have a history of congestive heart failure. She is doing much better since she saw Emily [name] he adjusted her medications. She was having shortness of breath with activity, but now she is able to help her mother clean her house without experiencing impaired exercise tolerance or respiratory symptoms. She is not on any blood thinners other than aspirin.  Excisional Biopsy Leiomyosarcoma extending to deep margin.   CT chest 09/16/2015  IMPRESSION: 1. No evidence of a residual or recurrent mass in the chest wall. 2. No evidence of metastatic disease in the chest. 3. Diffuse ground-glass centrilobular micronodularity throughout both lungs, upper lobe predominant, unchanged back to  2013. If the patient is a current smoker, this finding is most suggestive of smoking related interstitial lung disease. If the patient is not a current smoker, this finding could indicate subacute hypersensitivity pneumonitis or chronic postinflammatory nodularity. 4. Stable mild radiation fibrosis in the anterior mid to upper right lung. 5. Aortic atherosclerosis. One vessel coronary atherosclerosis.   Other Problems  Anxiety Disorder Arthritis Back Pain Breast Cancer Cancer Congestive Heart Failure Depression Diabetes Mellitus Gastroesophageal Reflux Disease Hepatitis High blood pressure Hypercholesterolemia Oophorectomy Bilateral. Pancreatitis  Past Surgical History Breast Biopsy Left. Breast Mass; Local Excision Right. Foot Surgery Right. Gallbladder Surgery - Open Hysterectomy (due to cancer) - Complete Hysterectomy (not due to cancer) - Complete Oral Surgery Tonsillectomy  Diagnostic Studies History Colonoscopy 5-10 years ago Mammogram within last year Pap Smear 1-5 years ago  Allergies Iodinated Glycerol *COUGH/COLD/ALLERGY*  Medication History Coreg (6.25MG  Tablet, Oral) Active. Femara (2.5MG  Tablet, Oral) Active. Melatonin (10MG  Capsule, Oral) Active. Senna (8.6MG  Capsule, Oral) Active. Magnesium (200MG  Tablet, Oral) Active. Venlafaxine HCl ER (150MG  Capsule ER 24HR, Oral) Active. Atorvastatin Calcium (20MG  Tablet, Oral) Active. Digoxin (0.1MG  Capsule, Oral) Active. Spironolactone (25MG  Tablet, Oral) Active. Gabapentin (800MG  Tablet, Oral) Active. GlipiZIDE (5MG  Tablet, Oral) Active. Enalapril Maleate (2.5MG  Tablet, Oral) Active. Sertraline HCl (50MG  Tablet, Oral) Active. Letrozole (2.5MG  Tablet, Oral) Active. HydrOXYzine HCl (25MG  Tablet, Oral) Active. Ferrous Sulfate (325 (65 Fe)MG Tablet, Oral) Active. Albuterol Sulfate (4MG  Tablet, Oral) Active. Loperamide HCl (2MG  Capsule, Oral)  Active. Medications Reconciled  Social History Alcohol use Occasional alcohol use. Caffeine use Carbonated beverages, Coffee. Illicit drug use Prefer  to discuss with provider. Tobacco use Current every day smoker.  Family History Alcohol Abuse Father. Cancer Mother. Diabetes Mellitus Mother.  Pregnancy / Birth History Age at menarche 21 years. Age of menopause <43 Contraceptive History Oral contraceptives. Gravida 3 Maternal age 13-20 Para 0    Review of Systems General Present- Fatigue and Weight Gain. Not Present- Appetite Loss, Chills, Fever, Night Sweats and Weight Loss. Skin Present- Dryness. Not Present- Change in Wart/Mole, Hives, Jaundice, New Lesions, Non-Healing Wounds, Rash and Ulcer. HEENT Not Present- Earache, Hearing Loss, Hoarseness, Nose Bleed, Oral Ulcers, Ringing in the Ears, Seasonal Allergies, Sinus Pain, Sore Throat, Visual Disturbances, Wears glasses/contact lenses and Yellow Eyes. Respiratory Present- Chronic Cough. Not Present- Bloody sputum, Difficulty Breathing, Snoring and Wheezing. Breast Not Present- Breast Mass, Breast Pain, Nipple Discharge and Skin Changes. Cardiovascular Not Present- Chest Pain, Difficulty Breathing Lying Down, Leg Cramps, Palpitations, Rapid Heart Rate, Shortness of Breath and Swelling of Extremities. Gastrointestinal Present- Bloating, Constipation and Excessive gas. Not Present- Abdominal Pain, Bloody Stool, Change in Bowel Habits, Chronic diarrhea, Difficulty Swallowing, Gets full quickly at meals, Hemorrhoids, Indigestion, Nausea, Rectal Pain and Vomiting. Female Genitourinary Present- Nocturia. Not Present- Frequency, Painful Urination, Pelvic Pain and Urgency. Musculoskeletal Present- Back Pain, Joint Pain and Swelling of Extremities. Not Present- Joint Stiffness, Muscle Pain and Muscle Weakness. Neurological Present- Trouble walking. Not Present- Decreased Memory, Fainting, Headaches, Numbness, Seizures,  Tingling, Tremor and Weakness. Psychiatric Not Present- Anxiety, Bipolar, Change in Sleep Pattern, Depression, Fearful and Frequent crying. Endocrine Present- Excessive Hunger and Hair Changes. Not Present- Cold Intolerance, Heat Intolerance, Hot flashes and New Diabetes. Hematology Not Present- Blood Thinners, Easy Bruising, Excessive bleeding, Gland problems, HIV and Persistent Infections.  Vitals  Weight: 158 lb Height: 65in Body Surface Area: 1.79 m Body Mass Index: 26.29 kg/m  Temp.: 85F(Temporal)  Pulse: 79 (Regular)  BP: 126/76 (Sitting, Left Arm, Standard)       Physical Exam  General Mental Status-Alert. General Appearance-Consistent with stated age. Hydration-Well hydrated. Voice-Normal.  Integumentary Note: right chest wall with obliquely oriented scar extending from just above around the midpoint of the clavicle inferomedially to around 3 cm below clavicle. 4 mm tender nodule superolaterally. chronic skin changes with sun/radiation damage. no palpable mass on the clavicle or chest wall otherwise.   Head and Neck Head-normocephalic, atraumatic with no lesions or palpable masses. Trachea-midline. Thyroid Gland Characteristics - normal size and consistency.  Eye Eyeball - Bilateral-Extraocular movements intact. Sclera/Conjunctiva - Bilateral-No scleral icterus.  Chest and Lung Exam Chest and lung exam reveals -quiet, even and easy respiratory effort with no use of accessory muscles and on auscultation, normal breath sounds, no adventitious sounds and normal vocal resonance. Inspection Chest Wall - Normal. Back - normal.  Breast Note: right lumpectomy site in upper outer quadrant with indention.   Cardiovascular Cardiovascular examination reveals -normal heart sounds, regular rate and rhythm with no murmurs and normal pedal pulses bilaterally.  Abdomen Inspection Inspection of the abdomen reveals - No  Hernias. Palpation/Percussion Palpation and Percussion of the abdomen reveal - Soft, Non Tender, No Rebound tenderness, No Rigidity (guarding) and No hepatosplenomegaly. Auscultation Auscultation of the abdomen reveals - Bowel sounds normal. Note: RUQ incision from open cholecystectomy   Neurologic Neurologic evaluation reveals -alert and oriented x 3 with no impairment of recent or remote memory. Mental Status-Normal.  Musculoskeletal Global Assessment -Note: no gross deformities.  Normal Exam - Left-Upper Extremity Strength Normal and Lower Extremity Strength Normal. Normal Exam - Right-Upper Extremity Strength Normal and  Lower Extremity Strength Normal.  Lymphatic Head & Neck  General Head & Neck Lymphatics: Bilateral - Description - Normal. Axillary  General Axillary Region: Bilateral - Description - Normal. Tenderness - Non Tender. Femoral & Inguinal  Generalized Femoral & Inguinal Lymphatics: Bilateral - Description - No Generalized lymphadenopathy.    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 I also think given the sarcoma diagnosis, it would be worthwhile to potentially take some of the periosteum on the clavicle.  We have sent a release of information to get the operative note from [place]. It sounds like from the path report that there was some skin taken with the excision, but I would like to know if there was also muscle taken.  Review the risks of surgery with the patient. I discussed that the primary risk is recurrent sarcoma, but also there is risk of bleeding, infection, wound dehiscence, and pneumothorax.  If these margins are positive, I am not sure that doing a clavicular resection or rib resection  would have a positive risk benefit ratio.  She has a cruise set up for 21 days in september. She also has an echo set up wtih Dr. Haroldine Laws. She will need cardiac risk stratification prior to surgery scheduling. Current Plans Schedule for Surgery Advised patient to stop ASA, anticoagulant, blood thinners, and NSAIDs 5 days prior to surgery. HISTORY OF RIGHT BREAST CANCER (Z85.3) Impression: Unclear if sarcoma is related to prior radiation. CHRONIC SYSTOLIC CHF (CONGESTIVE HEART FAILURE) (I50.22) Impression: Minimal to no symptoms at this point. Seems to be optimally medically managed.    Signed by Stark Klein, MD

## 2015-12-28 NOTE — Anesthesia Preprocedure Evaluation (Signed)
Anesthesia Evaluation  Patient identified by MRN, date of birth, ID band Patient awake    Reviewed: Allergy & Precautions, H&P , NPO status , Patient's Chart, lab work & pertinent test results  Airway Mallampati: II  TM Distance: >3 FB Neck ROM: Full    Dental  (+) Teeth Intact, Dental Advisory Given   Pulmonary Current Smoker,     + decreased breath sounds      Cardiovascular hypertension, + CAD ( 1st Mrg lesion, 40% stenosed.)  negative cardio ROS Normal cardiovascular exam Rhythm:Regular Rate:Normal  Left ventricle: The cavity size was normal. Wall thickness was   normal. Systolic function was normal. The estimated ejection   fraction was in the range of 55% to 60%. Wall motion was normal;   there were no regional wall motion abnormalities. Doppler   parameters are consistent with abnormal left ventricular   relaxation (grade 1 diastolic dysfunction). - Left atrium: The atrium was mildly dilated.   Neuro/Psych Anxiety Depression negative neurological ROS     GI/Hepatic GERD  Medicated,Pancreatitis   Endo/Other  diabetes, Type 2  Renal/GU negative Renal ROS  negative genitourinary   Musculoskeletal negative musculoskeletal ROS (+)   Abdominal   Peds  Hematology negative hematology ROS (+)   Anesthesia Other Findings   Reproductive/Obstetrics negative OB ROS                             Anesthesia Physical  Anesthesia Plan  ASA: III  Anesthesia Plan: General   Post-op Pain Management:    Induction: Intravenous  Airway Management Planned: Simple Face Mask and Oral ETT  Additional Equipment:   Intra-op Plan:   Post-operative Plan:   Informed Consent: I have reviewed the patients History and Physical, chart, labs and discussed the procedure including the risks, benefits and alternatives for the proposed anesthesia with the patient or authorized representative who has  indicated his/her understanding and acceptance.   Dental advisory given  Plan Discussed with: CRNA  Anesthesia Plan Comments:         Anesthesia Quick Evaluation

## 2015-12-28 NOTE — Transfer of Care (Signed)
Immediate Anesthesia Transfer of Care Note  Patient: Emily [name]  Procedure(s) Performed: Procedure(s): RE-EXCISION RIGHT CHEST WALL SARCOMA (Right)  Patient Location: PACU  Anesthesia Type:General  Level of Consciousness: awake, alert , oriented and patient cooperative  Airway & Oxygen Therapy: Patient Spontanous Breathing and Patient connected to face mask oxygen  Post-op Assessment: Report given to RN and Post -op Vital signs reviewed and stable  Post vital signs: Reviewed and stable  Last Vitals:  Vitals:   12/28/15 1226 12/28/15 1619  BP: 112/62   Pulse: 91   Resp: 18   Temp: 36.6 C 37.1 C    Last Pain:  Vitals:   12/28/15 1226  TempSrc: Oral      Patients Stated Pain Goal: 3 ([id number])  Complications: No apparent anesthesia complications

## 2015-12-28 NOTE — Anesthesia Procedure Notes (Signed)
Procedure Name: Intubation Date/Time: 12/28/2015 3:12 PM Performed by: Shirlyn Goltz Pre-anesthesia Checklist: Patient identified, Emergency Drugs available, Suction available and Patient being monitored Patient Re-evaluated:Patient Re-evaluated prior to inductionOxygen Delivery Method: Circle system utilized Preoxygenation: Pre-oxygenation with 100% oxygen Intubation Type: IV induction Ventilation: Mask ventilation without difficulty Laryngoscope Size: Mac and 3 Grade View: Grade III Tube type: Oral Tube size: 7.0 mm Number of attempts: 1 Airway Equipment and Method: Stylet Placement Confirmation: ETT inserted through vocal cords under direct vision,  positive ETCO2 and breath sounds checked- equal and bilateral Secured at: 22 cm Tube secured with: Tape Dental Injury: Teeth and Oropharynx as per pre-operative assessment

## 2015-12-28 NOTE — Anesthesia Postprocedure Evaluation (Signed)
Anesthesia Post Note  Patient: Emily [name]  Procedure(s) Performed: Procedure(s) (LRB): RE-EXCISION RIGHT CHEST WALL SARCOMA (Right)  Patient location during evaluation: PACU Anesthesia Type: General Level of consciousness: awake and alert Pain management: pain level controlled Vital Signs Assessment: post-procedure vital signs reviewed and stable Respiratory status: spontaneous breathing, nonlabored ventilation, respiratory function stable and patient connected to nasal cannula oxygen Cardiovascular status: blood pressure returned to baseline and stable Postop Assessment: no signs of nausea or vomiting Anesthetic complications: no    Last Vitals:  Vitals:   12/28/15 1711 12/28/15 1714  BP:  (!) 105/48  Pulse:  99  Resp:  18  Temp: 37 C     Last Pain:  Vitals:   12/28/15 1711  TempSrc:   PainSc: [place]

## 2015-12-28 NOTE — Interval H&P Note (Signed)
History and Physical Interval Note:  12/28/2015 2:35 PM  Emily [name]  has presented today for surgery, with the diagnosis of RIGHT CHEST WALL LEIOMYOSARCOMA  The various methods of treatment have been discussed with the patient and family. After consideration of risks, benefits and other options for treatment, the patient has consented to  Procedure(s): RE-EXCISION RIGHT CHEST WALL SARCOMA (Right) as a surgical intervention .  The patient's history has been reviewed, patient examined, no change in status, stable for surgery.  I have reviewed the patient's chart and labs.  Questions were answered to the patient's satisfaction.     Jonh Mcqueary

## 2015-12-29 ENCOUNTER — Encounter (HOSPITAL_COMMUNITY): Payer: Self-pay | Admitting: General Surgery

## 2015-12-31 NOTE — Progress Notes (Signed)
Please let patient know that there is no evidence of residual cancer in her pathology.

## 2016-01-10 ENCOUNTER — Telehealth: Payer: Self-pay | Admitting: Family Medicine

## 2016-01-10 ENCOUNTER — Ambulatory Visit (INDEPENDENT_AMBULATORY_CARE_PROVIDER_SITE_OTHER): Payer: Medicare Other | Admitting: Family Medicine

## 2016-01-10 ENCOUNTER — Ambulatory Visit (INDEPENDENT_AMBULATORY_CARE_PROVIDER_SITE_OTHER)
Admission: RE | Admit: 2016-01-10 | Discharge: 2016-01-10 | Disposition: A | Discharge status: Home / Self Care | Payer: Medicare Other | Source: Ambulatory Visit | Attending: Family Medicine | Admitting: Family Medicine

## 2016-01-10 ENCOUNTER — Encounter: Payer: Self-pay | Admitting: Family Medicine

## 2016-01-10 VITALS — BP 109/75 | HR 98 | Temp 98.0°F | Resp 18 | Wt 154.0 lb

## 2016-01-10 DIAGNOSIS — R05 Cough: Secondary | ICD-10-CM

## 2016-01-10 DIAGNOSIS — Z716 Tobacco abuse counseling: Secondary | ICD-10-CM | POA: Diagnosis not present

## 2016-01-10 DIAGNOSIS — J440 Chronic obstructive pulmonary disease with acute lower respiratory infection: Secondary | ICD-10-CM

## 2016-01-10 DIAGNOSIS — F17219 Nicotine dependence, cigarettes, with unspecified nicotine-induced disorders: Secondary | ICD-10-CM | POA: Diagnosis not present

## 2016-01-10 DIAGNOSIS — R059 Cough, unspecified: Secondary | ICD-10-CM

## 2016-01-10 DIAGNOSIS — Z720 Tobacco use: Secondary | ICD-10-CM

## 2016-01-10 DIAGNOSIS — J42 Unspecified chronic bronchitis: Secondary | ICD-10-CM

## 2016-01-10 DIAGNOSIS — J449 Chronic obstructive pulmonary disease, unspecified: Secondary | ICD-10-CM | POA: Insufficient documentation

## 2016-01-10 MED ORDER — BUDESONIDE-FORMOTEROL FUMARATE 160-4.5 MCG/ACT IN AERO: 2.0000 | INHALATION_SPRAY | Freq: Two times a day (BID) | RESPIRATORY_TRACT | 3 refills | Status: DC

## 2016-01-10 MED ORDER — IPRATROPIUM-ALBUTEROL 0.5-2.5 (3) MG/3ML IN SOLN
3.0000 mL | Freq: Once | RESPIRATORY_TRACT | Status: AC
  Administered 2016-01-10: 3 mL via RESPIRATORY_TRACT

## 2016-01-10 MED ORDER — PREDNISONE 50 MG PO TABS: 50.0000 mg | ORAL_TABLET | Freq: Every day | ORAL | 0 refills | Status: DC

## 2016-01-10 MED ORDER — VARENICLINE TARTRATE 0.5 MG PO TABS: ORAL_TABLET | ORAL | 0 refills | Status: DC

## 2016-01-10 MED ORDER — LEVOFLOXACIN 750 MG PO TABS: 750.0000 mg | ORAL_TABLET | Freq: Every day | ORAL | 0 refills | Status: DC

## 2016-01-10 MED ORDER — NICOTINE 14 MG/24HR TD PT24: 14.0000 mg | MEDICATED_PATCH | Freq: Every day | TRANSDERMAL | 0 refills | Status: DC

## 2016-01-10 MED ORDER — BUDESONIDE 180 MCG/ACT IN AEPB: 1.0000 | INHALATION_SPRAY | Freq: Two times a day (BID) | RESPIRATORY_TRACT | 11 refills | Status: DC

## 2016-01-10 NOTE — Telephone Encounter (Signed)
Please call pt: - her xray resulted with mild edema/fluid in her lungs. The pattern is not consistent with fluid derived from her heart.  - I have called in an abx and steriod burst for her to start. She will need close follow up in 1 week.  - If she feels more short of breath, heart racing, fever on medication she is to be seen immediately in the ED.
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
## 2016-01-10 NOTE — Telephone Encounter (Signed)
Patient insurance wont cover [employer] for prior authorization they stated she had to try Ar[employer]ity 133mcg or 243mcg first if you only disp 30 day supply they will cover.

## 2016-01-10 NOTE — Telephone Encounter (Signed)
Patient advised of results and RX.  Patient told the importance of F/U and patient states she will CB to f/u but she didn't have her calendar in front of her at this time.

## 2016-01-10 NOTE — Patient Instructions (Signed)
Please have xray completed now. I will call in appropriate antibiotic depending on those results.  I have called in an inhaler called Pulmicort for you to start daily every 12 hours.  Use the albuterol inhaler (the one you have now) for when you feel short of breath or wheezing only.   Continue mucinex. Rest and HYDRATE>   Smoking cessation:  - pick a day about 3 weeks away. This gives you time to get over this illness and start then start chantix.  - In the meantime start cutting back in the amount you smoke daily.  - Start chantix about 10-14  days prior to quit date following tapering instructions. The night before quit date place a patch on your skin. Make certain to through out all cigarettes.    Smoking Cessation, Tips for Success If you are ready to quit smoking, congratulations! You have chosen to help yourself be healthier. Cigarettes bring nicotine, tar, carbon monoxide, and other irritants into your body. Your lungs, heart, and blood vessels will be able to work better without these poisons. There are many different ways to quit smoking. Nicotine gum, nicotine patches, a nicotine inhaler, or nicotine nasal spray can help with physical craving. Hypnosis, support groups, and medicines help break the habit of smoking. WHAT THINGS CAN I DO TO MAKE QUITTING EASIER?  Here are some tips to help you quit for good:  Pick a date when you will quit smoking completely. Tell all of your friends and family about your plan to quit on that date.  Do not try to slowly cut down on the number of cigarettes you are smoking. Pick a quit date and quit smoking completely starting on that day.  Throw away all cigarettes.   Clean and remove all ashtrays from your home, work, and car.  On a card, write down your reasons for quitting. Carry the card with you and read it when you get the urge to smoke.  Cleanse your body of nicotine. Drink enough water and fluids to keep your urine clear or pale yellow.  Do this after quitting to flush the nicotine from your body.  Learn to predict your moods. Do not let a bad situation be your excuse to have a cigarette. Some situations in your life might tempt you into wanting a cigarette.  Never have "just one" cigarette. It leads to wanting another and another. Remind yourself of your decision to quit.  Change habits associated with smoking. If you smoked while driving or when feeling stressed, try other activities to replace smoking. Stand up when drinking your coffee. Brush your teeth after eating. Sit in a different chair when you read the paper. Avoid alcohol while trying to quit, and try to drink fewer caffeinated beverages. Alcohol and caffeine may urge you to smoke.  Avoid foods and drinks that can trigger a desire to smoke, such as sugary or spicy foods and alcohol.  Ask people who smoke not to smoke around you.  Have something planned to do right after eating or having a cup of coffee. For example, plan to take a walk or exercise.  Try a relaxation exercise to calm you down and decrease your stress. Remember, you may be tense and nervous for the first 2 weeks after you quit, but this will pass.  Find new activities to keep your hands busy. Play with a pen, coin, or rubber band. Doodle or draw things on paper.  Brush your teeth right after eating. This will help cut  down on the craving for the taste of tobacco after meals. You can also try mouthwash.   Use oral substitutes in place of cigarettes. Try using lemon drops, carrots, cinnamon sticks, or chewing gum. Keep them handy so they are available when you have the urge to smoke.  When you have the urge to smoke, try deep breathing.  Designate your home as a nonsmoking area.  If you are a heavy smoker, ask your health care provider about a prescription for nicotine chewing gum. It can ease your withdrawal from nicotine.  Reward yourself. Set aside the cigarette money you save and buy yourself  something nice.  Look for support from others. Join a support group or smoking cessation program. Ask someone at home or at work to help you with your plan to quit smoking.  Always ask yourself, "Do I need this cigarette or is this just a reflex?" Tell yourself, "Today, I choose not to smoke," or "I do not want to smoke." You are reminding yourself of your decision to quit.  Do not replace cigarette smoking with electronic cigarettes (commonly called e-cigarettes). The safety of e-cigarettes is unknown, and some may contain harmful chemicals.  If you relapse, do not give up! Plan ahead and think about what you will do the next time you get the urge to smoke. HOW WILL I FEEL WHEN I QUIT SMOKING? You may have symptoms of withdrawal because your body is used to nicotine (the addictive substance in cigarettes). You may crave cigarettes, be irritable, feel very hungry, cough often, get headaches, or have difficulty concentrating. The withdrawal symptoms are only temporary. They are strongest when you first quit but will go away within 10-14 days. When withdrawal symptoms occur, stay in control. Think about your reasons for quitting. Remind yourself that these are signs that your body is healing and getting used to being without cigarettes. Remember that withdrawal symptoms are easier to treat than the major diseases that smoking can cause.  Even after the withdrawal is over, expect periodic urges to smoke. However, these cravings are generally short lived and will go away whether you smoke or not. Do not smoke! WHAT RESOURCES ARE AVAILABLE TO HELP ME QUIT SMOKING? Your health care provider can direct you to community resources or hospitals for support, which may include:  Group support.  Education.  Hypnosis.  Therapy.   This information is not intended to replace advice given to you by your health care provider. Make sure you discuss any questions you have with your health care provider.    Document Released: 11/12/2003 Document Revised: 03/06/2014 Document Reviewed: 08/01/2012 Elsevier Interactive Patient Education [employer].

## 2016-01-12 ENCOUNTER — Encounter (HOSPITAL_COMMUNITY): Payer: Self-pay | Admitting: Clinical

## 2016-01-12 ENCOUNTER — Ambulatory Visit (INDEPENDENT_AMBULATORY_CARE_PROVIDER_SITE_OTHER): Payer: Medicare Other | Admitting: Clinical

## 2016-01-12 DIAGNOSIS — F411 Generalized anxiety disorder: Secondary | ICD-10-CM | POA: Diagnosis not present

## 2016-01-12 DIAGNOSIS — F331 Major depressive disorder, recurrent, moderate: Secondary | ICD-10-CM | POA: Diagnosis not present
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
## 2016-01-13 ENCOUNTER — Encounter: Payer: Self-pay | Admitting: Internal Medicine

## 2016-01-13 ENCOUNTER — Ambulatory Visit (INDEPENDENT_AMBULATORY_CARE_PROVIDER_SITE_OTHER): Payer: Medicare Other | Admitting: Internal Medicine

## 2016-01-13 VITALS — BP 102/80 | HR 84 | Wt 157.0 lb

## 2016-01-13 DIAGNOSIS — E1159 Type 2 diabetes mellitus with other circulatory complications: Secondary | ICD-10-CM

## 2016-01-13 DIAGNOSIS — E1165 Type 2 diabetes mellitus with hyperglycemia: Secondary | ICD-10-CM

## 2016-01-13 DIAGNOSIS — IMO0001 Reserved for inherently not codable concepts without codable children: Secondary | ICD-10-CM

## 2016-01-13 MED ORDER — GLIPIZIDE 10 MG PO TABS: ORAL_TABLET | ORAL | 1 refills | Status: DC

## 2016-01-13 MED ORDER — INSULIN NPH (HUMAN) (ISOPHANE) 100 UNIT/ML ~~LOC~~ SUSP: SUBCUTANEOUS | 11 refills | Status: DC

## 2016-01-13 MED ORDER — "INSULIN SYRINGE-NEEDLE U-100 31G X 5/16"" 0.5 ML MISC": 4 refills | Status: DC

## 2016-01-13 NOTE — Patient Instructions (Signed)
Please continue; - Metformin 1000 mg 2x a day.  Decrease: - Glipizide to 5 mg before the 1st meal of the day and 10 mg before dinner  Add: - NPH insulin 10 units in am and 15 units at bedtime.  Please let me know if the sugars are consistently <80 or >200.  Please return in 1.5 months with your sugar log.   PATIENT INSTRUCTIONS FOR TYPE 2 DIABETES:  DIET AND EXERCISE Diet and exercise is an important part of diabetic treatment.  We recommended aerobic exercise in the form of brisk walking (working between 40-60% of maximal aerobic capacity, similar to brisk walking) for 150 minutes per week (such as 30 minutes five days per week) along with 3 times per week performing 'resistance' training (using various gauge rubber tubes with handles) 5-10 exercises involving the major muscle groups (upper body, lower body and core) performing 10-15 repetitions (or near fatigue) each exercise. Start at half the above goal but build slowly to reach the above goals. If limited by weight, joint pain, or disability, we recommend daily walking in a swimming pool with water up to waist to reduce pressure from joints while allow for adequate exercise.    BLOOD GLUCOSES Monitoring your blood glucoses is important for continued management of your diabetes. Please check your blood glucoses 2-4 times a day: fasting, before meals and at bedtime (you can rotate these measurements - e.g. one day check before the 3 meals, the next day check before 2 of the meals and before bedtime, etc.).   HYPOGLYCEMIA (low blood sugar) Hypoglycemia is usually a reaction to not eating, exercising, or taking too much insulin/ other diabetes drugs.  Symptoms include tremors, sweating, hunger, confusion, headache, etc. Treat IMMEDIATELY with 15 grams of Carbs: . 4 glucose tablets .  cup regular juice/soda . 2 tablespoons raisins . 4 teaspoons sugar . 1 tablespoon honey Recheck blood glucose in 15 mins and repeat above if still  symptomatic/blood glucose <100.  RECOMMENDATIONS TO REDUCE YOUR RISK OF DIABETIC COMPLICATIONS: * Take your prescribed MEDICATION(S) * Follow a DIABETIC diet: Complex carbs, fiber rich foods, (monounsaturated and polyunsaturated) fats * AVOID saturated/trans fats, high fat foods, >2,300 mg salt per day. * EXERCISE at least 5 times a week for 30 minutes or preferably daily.  * DO NOT SMOKE OR DRINK more than 1 drink a day. * Check your FEET every day. Do not wear tightfitting shoes. Contact us if you develop an ulcer * See your EYE doctor once a year or more if needed * Get a FLU shot once a year * Get a PNEUMONIA vaccine once before and once after age 34 years  GOALS:  * Your Hemoglobin A1c of <7%  * fasting sugars need to be <130 * after meals sugars need to be <180 (2h after you start eating) * Your Systolic BP should be [id number] or lower  * Your Diastolic BP should be 80 or lower  * Your HDL (Good Cholesterol) should be 40 or higher  * Your LDL (Bad Cholesterol) should be 100 or lower. * Your Triglycerides should be 150 or lower  * Your Urine microalbumin (kidney function) should be <30 * Your Body Mass Index should be 25 or lower    Please consider the following ways to cut down carbs and fat and increase fiber and micronutrients in your diet: - substitute whole grain for white bread or pasta - substitute brown rice for white rice - substitute 90-calorie flat bread pieces for  slices of bread when possible - substitute sweet potatoes or yams for white potatoes - substitute humus for margarine - substitute tofu for cheese when possible - substitute almond or rice milk for regular milk (would not drink soy milk daily due to concern for soy estrogen influence on breast cancer risk) - substitute dark chocolate for other sweets when possible - substitute water - can add lemon or orange slices for taste - for diet sodas (artificial sweeteners will trick your body that you can eat sweets  without getting calories and will lead you to overeating and weight gain in the long run) - do not skip breakfast or other meals (this will slow down the metabolism and will result in more weight gain over time)  - can try smoothies made from fruit and almond/rice milk in am instead of regular breakfast - can also try old-fashioned (not instant) oatmeal made with almond/rice milk in am - order the dressing on the side when eating salad at a restaurant (pour less than half of the dressing on the salad) - eat as little meat as possible - can try juicing, but should not forget that juicing will get rid of the fiber, so would alternate with eating raw veg./fruits or drinking smoothies - use as little oil as possible, even when using olive oil - can dress a salad with a mix of balsamic vinegar and lemon juice, for e.g. - use agave nectar, stevia sugar, or regular sugar rather than artificial sweateners - steam or broil/roast veggies  - snack on veggies/fruit/nuts (unsalted, preferably) when possible, rather than processed foods - reduce or eliminate aspartame in diet (it is in diet sodas, chewing gum, etc) Read the labels!  Try to read Dr. Janene Harvey book: "Program for Reversing Diabetes" for other ideas for healthy eating.
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
## 2016-01-18 ENCOUNTER — Other Ambulatory Visit: Payer: Self-pay | Admitting: Family Medicine

## 2016-01-18 DIAGNOSIS — E1165 Type 2 diabetes mellitus with hyperglycemia: Principal | ICD-10-CM

## 2016-01-18 DIAGNOSIS — IMO0001 Reserved for inherently not codable concepts without codable children: Secondary | ICD-10-CM

## 2016-01-27 ENCOUNTER — Ambulatory Visit (HOSPITAL_COMMUNITY): Payer: Self-pay | Admitting: Psychiatry

## 2016-01-28 ENCOUNTER — Other Ambulatory Visit: Payer: Self-pay | Admitting: Family Medicine

## 2016-01-28 DIAGNOSIS — E1165 Type 2 diabetes mellitus with hyperglycemia: Principal | ICD-10-CM

## 2016-01-28 DIAGNOSIS — IMO0001 Reserved for inherently not codable concepts without codable children: Secondary | ICD-10-CM

## 2016-02-03 ENCOUNTER — Ambulatory Visit (HOSPITAL_COMMUNITY): Payer: Self-pay | Admitting: Clinical

## 2016-02-08 ENCOUNTER — Telehealth: Payer: Self-pay | Admitting: Family Medicine

## 2016-02-08 NOTE — Telephone Encounter (Signed)
Called patient this was refilled by Dr Cruzita Lederer on 01/13/16. Patient will follow up with Dr Cruzita Lederer.

## 2016-02-08 NOTE — Telephone Encounter (Signed)
Patient calling to request refill of glipiZIDE (GLUCOTROL) 10 MG tablet.  States pharmacy has requested but has not heard back from office.  Pharmacy:   [facility] [address] [place] - [place]  [address] [phone] (Phone) [phone] (Fax)

## 2016-02-10 ENCOUNTER — Telehealth: Payer: Self-pay | Admitting: Internal Medicine

## 2016-02-10 DIAGNOSIS — E1165 Type 2 diabetes mellitus with hyperglycemia: Principal | ICD-10-CM

## 2016-02-10 DIAGNOSIS — IMO0001 Reserved for inherently not codable concepts without codable children: Secondary | ICD-10-CM

## 2016-02-10 NOTE — Telephone Encounter (Signed)
Pt called in requesting a refill of her Glipizide be sent to the [facility] in [place].

## 2016-02-11 ENCOUNTER — Ambulatory Visit: Payer: Medicare Other | Admitting: Oncology

## 2016-02-11 MED ORDER — GLIPIZIDE 10 MG PO TABS: ORAL_TABLET | ORAL | 1 refills | Status: DC

## 2016-02-11 NOTE — Telephone Encounter (Signed)
Refill submitted per patient's request.  

## 2016-02-12 ENCOUNTER — Emergency Department (HOSPITAL_COMMUNITY)
Admission: EM | Admit: 2016-02-12 | Discharge: 2016-02-12 | Disposition: A | Discharge status: Home / Self Care | Payer: Medicare Other | Attending: Emergency Medicine | Admitting: Emergency Medicine

## 2016-02-12 ENCOUNTER — Emergency Department (HOSPITAL_COMMUNITY): Payer: Medicare Other

## 2016-02-12 ENCOUNTER — Encounter (HOSPITAL_COMMUNITY): Payer: Self-pay | Admitting: *Deleted

## 2016-02-12 DIAGNOSIS — W01198A Fall on same level from slipping, tripping and stumbling with subsequent striking against other object, initial encounter: Secondary | ICD-10-CM | POA: Diagnosis not present

## 2016-02-12 DIAGNOSIS — S6991XA Unspecified injury of right wrist, hand and finger(s), initial encounter: Secondary | ICD-10-CM | POA: Diagnosis present

## 2016-02-12 DIAGNOSIS — J449 Chronic obstructive pulmonary disease, unspecified: Secondary | ICD-10-CM | POA: Insufficient documentation

## 2016-02-12 DIAGNOSIS — S63501A Unspecified sprain of right wrist, initial encounter: Secondary | ICD-10-CM | POA: Diagnosis not present

## 2016-02-12 DIAGNOSIS — I5023 Acute on chronic systolic (congestive) heart failure: Secondary | ICD-10-CM | POA: Insufficient documentation

## 2016-02-12 DIAGNOSIS — Y999 Unspecified external cause status: Secondary | ICD-10-CM | POA: Insufficient documentation

## 2016-02-12 DIAGNOSIS — F17210 Nicotine dependence, cigarettes, uncomplicated: Secondary | ICD-10-CM | POA: Diagnosis not present

## 2016-02-12 DIAGNOSIS — I110 Hypertensive heart disease with heart failure: Secondary | ICD-10-CM | POA: Insufficient documentation

## 2016-02-12 DIAGNOSIS — E1140 Type 2 diabetes mellitus with diabetic neuropathy, unspecified: Secondary | ICD-10-CM | POA: Diagnosis not present

## 2016-02-12 DIAGNOSIS — Z7982 Long term (current) use of aspirin: Secondary | ICD-10-CM | POA: Insufficient documentation

## 2016-02-12 DIAGNOSIS — Z794 Long term (current) use of insulin: Secondary | ICD-10-CM | POA: Diagnosis not present

## 2016-02-12 DIAGNOSIS — M25521 Pain in right elbow: Secondary | ICD-10-CM | POA: Insufficient documentation

## 2016-02-12 DIAGNOSIS — Z853 Personal history of malignant neoplasm of breast: Secondary | ICD-10-CM | POA: Diagnosis not present

## 2016-02-12 DIAGNOSIS — Y939 Activity, unspecified: Secondary | ICD-10-CM | POA: Diagnosis not present

## 2016-02-12 DIAGNOSIS — Y929 Unspecified place or not applicable: Secondary | ICD-10-CM | POA: Insufficient documentation

## 2016-02-12 DIAGNOSIS — W19XXXA Unspecified fall, initial encounter: Secondary | ICD-10-CM

## 2016-02-12 MED ORDER — IBUPROFEN 400 MG PO TABS
800.0000 mg | ORAL_TABLET | Freq: Once | ORAL | Status: AC
  Administered 2016-02-12: 800 mg via ORAL
  Filled 2016-02-12: qty 2

## 2016-02-12 MED ORDER — IBUPROFEN 600 MG PO TABS: 600.0000 mg | ORAL_TABLET | Freq: Four times a day (QID) | ORAL | 0 refills | Status: DC | PRN

## 2016-02-12 MED ORDER — ACETAMINOPHEN 325 MG PO TABS
650.0000 mg | ORAL_TABLET | Freq: Once | ORAL | Status: AC
  Administered 2016-02-12: 650 mg via ORAL
  Filled 2016-02-12: qty 2

## 2016-02-12 NOTE — ED Triage Notes (Signed)
The pt tripped and fell last  Pm and struck her rt wrist on something  Today she has increased pain with swelling   And abrasions to her rt elbow

## 2016-02-12 NOTE — ED Notes (Signed)
Splint placedd on her rt wrist  She has not taken any meds since last pm  Ice pack placed also
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
## 2016-02-13 ENCOUNTER — Other Ambulatory Visit (HOSPITAL_COMMUNITY): Payer: Self-pay | Admitting: Psychiatry

## 2016-02-13 DIAGNOSIS — F321 Major depressive disorder, single episode, moderate: Secondary | ICD-10-CM

## 2016-02-14 ENCOUNTER — Telehealth: Payer: Self-pay | Admitting: *Deleted

## 2016-02-14 NOTE — Telephone Encounter (Signed)
"  I called to cancel the appointment 02-11-2016.  I also have called and left messages to reschedule so I pressed a different number this time.  I need to reschedule to January, any day of the week.  I prefer afternoon appointments."  Will send scheduling message to scheduling team to reschedule F/U.

## 2016-02-15 ENCOUNTER — Telehealth: Payer: Self-pay | Admitting: Oncology

## 2016-02-15 NOTE — Telephone Encounter (Signed)
Unable to confirm r/s appt date/time with pt. Letter sent by mail 12/19

## 2016-02-16 NOTE — Telephone Encounter (Signed)
Met with Dr. Adele Schilder who approved a one time refill order for patient's prescribed Hydroxyzine 50 mg capsules and e-scribed a new order to patient's [facility] [address] in [place], [place] as approved by Dr. Adele Schilder.  Patient returns for eval 03/28/16.

## 2016-02-25 ENCOUNTER — Ambulatory Visit: Payer: Self-pay | Admitting: Internal Medicine

## 2016-02-29 ENCOUNTER — Telehealth: Payer: Self-pay | Admitting: Family Medicine

## 2016-02-29 ENCOUNTER — Ambulatory Visit (INDEPENDENT_AMBULATORY_CARE_PROVIDER_SITE_OTHER): Payer: Medicare Other | Admitting: Family Medicine

## 2016-02-29 ENCOUNTER — Encounter: Payer: Self-pay | Admitting: Family Medicine

## 2016-02-29 ENCOUNTER — Ambulatory Visit (HOSPITAL_BASED_OUTPATIENT_CLINIC_OR_DEPARTMENT_OTHER)
Admission: RE | Admit: 2016-02-29 | Discharge: 2016-02-29 | Disposition: A | Discharge status: Home / Self Care | Payer: Medicare Other | Source: Ambulatory Visit | Attending: Family Medicine | Admitting: Family Medicine

## 2016-02-29 VITALS — BP 100/65 | HR 88 | Temp 97.6°F | Resp 20 | Wt 157.5 lb

## 2016-02-29 DIAGNOSIS — R2681 Unsteadiness on feet: Secondary | ICD-10-CM

## 2016-02-29 DIAGNOSIS — G90521 Complex regional pain syndrome I of right lower limb: Secondary | ICD-10-CM

## 2016-02-29 DIAGNOSIS — R079 Chest pain, unspecified: Secondary | ICD-10-CM | POA: Diagnosis not present

## 2016-02-29 DIAGNOSIS — I700 Atherosclerosis of aorta: Secondary | ICD-10-CM | POA: Insufficient documentation

## 2016-02-29 DIAGNOSIS — M546 Pain in thoracic spine: Secondary | ICD-10-CM | POA: Diagnosis not present

## 2016-02-29 DIAGNOSIS — M792 Neuralgia and neuritis, unspecified: Secondary | ICD-10-CM | POA: Diagnosis not present

## 2016-02-29 DIAGNOSIS — W19XXXA Unspecified fall, initial encounter: Secondary | ICD-10-CM

## 2016-02-29 DIAGNOSIS — R0781 Pleurodynia: Secondary | ICD-10-CM

## 2016-02-29 DIAGNOSIS — X58XXXA Exposure to other specified factors, initial encounter: Secondary | ICD-10-CM | POA: Diagnosis not present

## 2016-02-29 DIAGNOSIS — S2243XA Multiple fractures of ribs, bilateral, initial encounter for closed fracture: Secondary | ICD-10-CM | POA: Insufficient documentation

## 2016-02-29 DIAGNOSIS — S299XXA Unspecified injury of thorax, initial encounter: Secondary | ICD-10-CM | POA: Diagnosis not present

## 2016-02-29 MED ORDER — HYDROCODONE-ACETAMINOPHEN 5-325 MG PO TABS: 1.0000 | ORAL_TABLET | Freq: Four times a day (QID) | ORAL | 0 refills | Status: DC | PRN
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
## 2016-02-29 NOTE — Telephone Encounter (Signed)
Patient notified and verbalized understanding. 
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
## 2016-02-29 NOTE — Patient Instructions (Addendum)
Please have xrays completed.  I will call you with results once received. I have placed referral to pain mgt.   I will also likely have you attend gait training (PT).    Please help [place] help you:  It is a privilege to be able to take care of great patients such as yourself. We are honored you have chosen [facility] for your Primary Care home. Below you will find basic instructions that you may need to access in the future. Please help [place] help you by reading the instructions, which cover many of the frequent questions we experience.   Prescription refills and request:  -In order to allow more efficient response time, please call your pharmacy for all refills. They will forward the request electronically to [place]. This allows for the quickest possible response. Request left on a nurse line can take longer to refill, since these are checked as time allows between office patients and other phone calls.  - refill request can take up to 3-5 working days to complete.  - If request is sent electronically and request is appropiate, it is usually completed in 1-2 business days.  - all patients will need to be seen routinely for all chronic medical conditions requiring prescription medications (see follow-up below). If you are overdue for follow up on your condition, you will be asked to make an appointment and we will call in enough medication to cover you until your appointment (up to 30 days).  - all controlled substances will require a face to face visit to request/refill.  - if you desire your prescriptions to go through a new pharmacy, and have an active script at original pharmacy, you will need to call your pharmacy and have scripts transferred to new pharmacy. This is completed between the pharmacy locations and not by your provider.    Results: If any images or labs were ordered, it can take up to 1 week to get results depending on the test ordered and the lab/facility running and resulting the  test. - Normal or stable results, which do not need further discussion, will be released to your mychart immediately with attached note to you. A call will not be generated for normal results. Please make certain to sign up for mychart. If you have questions on how to activate your mychart you can call the front office.  - If your results need further discussion, our office will attempt to contact you via phone, and if unable to reach you after 2 attempts, we will release your abnormal result to your mychart with instructions.  - All results will be automatically released in mychart after 1 week.  - Your provider will provide you with explanation and instruction on all relevant material in your results. Please keep in mind, results and labs may appear confusing or abnormal to the untrained eye, but it does not mean they are actually abnormal for you personally. If you have any questions about your results that are not covered, or you desire more detailed explanation than what was provided, you should make an appointment with your provider to do so.   Our office handles many outgoing and incoming calls daily. If we have not contacted you within 1 week about your results, please check your mychart to see if there is a message first and if not, then contact our office.  In helping with this matter, you help decrease call volume, and therefore allow [place] to be able to respond to patients needs  more efficiently.   Acute office visits (sick visit):  An acute visit is intended for a new problem and are scheduled in shorter time slots to allow schedule openings for patients with new problems. This is the appropriate visit to discuss a new problem. In order to provide you with excellent quality medical care with proper time for you to explain your problem, have an exam and receive treatment with instructions, these appointments should be limited to one new problem per visit. If you experience a new problem, in which  you desire to be addressed, please make an acute office visit, we save openings on the schedule to accommodate you. Please do not save your new problem for any other type of visit, let us take care of it properly and quickly for you.   Follow up visits:  Depending on your condition(s) your provider will need to see you routinely in order to provide you with quality care and prescribe medication(s). Most chronic conditions (Example: hypertension, Diabetes, depression/anxiety... etc), require visits a couple times a year. Your provider will instruct you on proper follow up for your personal medical conditions and history. Please make certain to make follow up appointments for your condition as instructed. Failing to do so could result in lapse in your medication treatment/refills. If you request a refill, and are overdue to be seen on a condition, we will always provide you with a 30 day script (once) to allow you time to schedule.    Medicare wellness (well visit): - we have a wonderful Nurse Maudie Mercury), that will meet with you and provide you will yearly medicare wellness visits. These visits should occur yearly (can not be scheduled less than 1 calendar year apart) and cover preventive health, immunizations, advance directives and screenings you are entitled to yearly through your medicare benefits. Do not miss out on your entitled benefits, this is when medicare will pay for these benefits to be ordered for you.  These are strongly encouraged by your provider and is the appropriate type of visit to make certain you are up to date with all preventive health benefits. If you have not had your medicare wellness exam in the last 12 months, please make certain to schedule one by calling the office and schedule your medicare wellness with Maudie Mercury as soon as possible.   Yearly physical (well visit):  - Adults are recommended to be seen yearly for physicals. Check with your insurance and date of your last physical, most  insurances require one calendar year between physicals. Physicals include all preventive health topics, screenings, medical exam and labs that are appropriate for gender/age and history. You may have fasting labs needed at this visit. This is a well visit (not a sick visit), acute topics should not be covered during this visit.  - Pediatric patients are seen more frequently when they are younger. Your provider will advise you on well child visit timing that is appropriate for your their age. - This is not a medicare wellness visit. Medicare wellness exams do not have an exam portion to the visit. Some medicare companies allow for a physical, some do not allow a yearly physical. If your medicare allows a yearly physical you can schedule the medicare wellness with our nurse Maudie Mercury and have your physical with your provider after, on the same day. Please check with insurance for your full benefits.   Late Policy/No Shows:  - all new patients should arrive 15-30 minutes earlier than appointment to allow [place] time  to  obtain all personal demographics,  insurance information and for you to complete office paperwork. - All established patients should arrive 10-15 minutes earlier than appointment time to update all information and be checked in .  - In our best efforts to run on time, if you are late for your appointment you will be asked to either reschedule or if able, we will work you back into the schedule. There will be a wait time to work you back in the schedule,  depending on availability.  - If you are unable to make it to your appointment as scheduled, please call 24 hours ahead of time to allow [place] to fill the time slot with someone else who needs to be seen. If you do not cancel your appointment ahead of time, you may be charged a no show fee.

## 2016-03-06 ENCOUNTER — Ambulatory Visit: Payer: Medicare Other | Admitting: Physical Therapy

## 2016-03-07 ENCOUNTER — Ambulatory Visit (INDEPENDENT_AMBULATORY_CARE_PROVIDER_SITE_OTHER): Payer: Medicare Other | Admitting: Clinical

## 2016-03-07 DIAGNOSIS — F331 Major depressive disorder, recurrent, moderate: Secondary | ICD-10-CM

## 2016-03-07 DIAGNOSIS — F411 Generalized anxiety disorder: Secondary | ICD-10-CM

## 2016-03-07 NOTE — Progress Notes (Signed)
   THERAPIST PROGRESS NOTE  Session Time: 12:03 - 12:56  Participation Level: Active  Behavioral Response: CasualAlertDepressed  Type of Therapy: Individual Therapy  Treatment Goals addressed: improve psychiatric symptom, elevate mood , improve unhelpful thought patterns, , reduce irrational worries and fears,  healthy coping skills  Interventions: motivational interviewing, cbt,  grounding and mindfulness techniques  Summary: [name] is a 56 y.o. female who presents with Major Depressive Disorder, Recurrent, Moderate and Generalized Anxiety Disorder.   Suicidal/Homicidal: No without intent/plan  Therapist Response: Emily [name] met with clinician for an individual session. She discussed her  Psychiatric symptoms, her current life events and her goals for therapy. Emily [name] shared about some of her current life challenges. Clinician asked open ended questions and Emily [name] shared about the important relationships in her life and how she felt they contributed to her  Depression. Clinician introduced her to some grounding and mindfulness techniques. Clinician explained the process, purpose and practice of the techniques. Emily [name] and clinician practiced some of the techniques together. Clinician also introduced some basic cbt concepts. Emily [name] and clinician discussed the thought emotion connection. Clinician gave Emily [name] a homework packet on depression which she agreed to complete and bring back with her to next session  Plan: Return in 1-2 weeks.  Diagnosis: Axis I: Major Depressive Disorder, Recurrent, Moderate and Generalized Anxiety Disorder      Eldean Klatt A, LCSW 03/07/2016

## 2016-03-10 ENCOUNTER — Telehealth: Payer: Self-pay | Admitting: Oncology

## 2016-03-10 ENCOUNTER — Ambulatory Visit: Payer: Medicare Other | Admitting: Oncology

## 2016-03-10 NOTE — Telephone Encounter (Signed)
Patient called to cancel her appointment for today I asked if she wanted to reschedule and she said not at this time she would call back

## 2016-03-12 ENCOUNTER — Encounter (HOSPITAL_COMMUNITY): Payer: Self-pay | Admitting: Clinical

## 2016-03-13 ENCOUNTER — Encounter (HOSPITAL_COMMUNITY): Payer: Self-pay | Admitting: Emergency Medicine

## 2016-03-13 ENCOUNTER — Emergency Department (HOSPITAL_COMMUNITY)
Admission: EM | Admit: 2016-03-13 | Discharge: 2016-03-13 | Disposition: A | Discharge status: Home / Self Care | Payer: Medicare Other | Attending: Emergency Medicine | Admitting: Emergency Medicine

## 2016-03-13 ENCOUNTER — Emergency Department (HOSPITAL_COMMUNITY): Payer: Medicare Other

## 2016-03-13 DIAGNOSIS — Z794 Long term (current) use of insulin: Secondary | ICD-10-CM | POA: Insufficient documentation

## 2016-03-13 DIAGNOSIS — Y929 Unspecified place or not applicable: Secondary | ICD-10-CM | POA: Insufficient documentation

## 2016-03-13 DIAGNOSIS — F17210 Nicotine dependence, cigarettes, uncomplicated: Secondary | ICD-10-CM | POA: Insufficient documentation

## 2016-03-13 DIAGNOSIS — J449 Chronic obstructive pulmonary disease, unspecified: Secondary | ICD-10-CM | POA: Insufficient documentation

## 2016-03-13 DIAGNOSIS — Z7982 Long term (current) use of aspirin: Secondary | ICD-10-CM | POA: Insufficient documentation

## 2016-03-13 DIAGNOSIS — E1140 Type 2 diabetes mellitus with diabetic neuropathy, unspecified: Secondary | ICD-10-CM | POA: Insufficient documentation

## 2016-03-13 DIAGNOSIS — I5023 Acute on chronic systolic (congestive) heart failure: Secondary | ICD-10-CM | POA: Diagnosis not present

## 2016-03-13 DIAGNOSIS — I2510 Atherosclerotic heart disease of native coronary artery without angina pectoris: Secondary | ICD-10-CM | POA: Diagnosis not present

## 2016-03-13 DIAGNOSIS — Y939 Activity, unspecified: Secondary | ICD-10-CM | POA: Diagnosis not present

## 2016-03-13 DIAGNOSIS — Y999 Unspecified external cause status: Secondary | ICD-10-CM | POA: Insufficient documentation

## 2016-03-13 DIAGNOSIS — Z853 Personal history of malignant neoplasm of breast: Secondary | ICD-10-CM | POA: Diagnosis not present

## 2016-03-13 DIAGNOSIS — S2241XA Multiple fractures of ribs, right side, initial encounter for closed fracture: Secondary | ICD-10-CM | POA: Insufficient documentation

## 2016-03-13 DIAGNOSIS — E1159 Type 2 diabetes mellitus with other circulatory complications: Secondary | ICD-10-CM | POA: Insufficient documentation

## 2016-03-13 DIAGNOSIS — I110 Hypertensive heart disease with heart failure: Secondary | ICD-10-CM | POA: Insufficient documentation

## 2016-03-13 DIAGNOSIS — S299XXA Unspecified injury of thorax, initial encounter: Secondary | ICD-10-CM | POA: Diagnosis present

## 2016-03-13 DIAGNOSIS — W19XXXA Unspecified fall, initial encounter: Secondary | ICD-10-CM | POA: Insufficient documentation

## 2016-03-13 LAB — BASIC METABOLIC PANEL
ANION GAP: 11 (ref 5–15)
BUN: 18 mg/dL (ref 6–20)
CALCIUM: 9.3 mg/dL (ref 8.9–10.3)
CO2: 24 mmol/L (ref 22–32)
Chloride: 97 mmol/L — ABNORMAL LOW (ref 101–111)
Creatinine, Ser: 0.71 mg/dL (ref 0.44–1.00)
GFR calc Af Amer: >60 mL/min (ref >60)
GLUCOSE: 173 mg/dL — AB (ref 65–99)
Potassium: 4.9 mmol/L (ref 3.5–5.1)
Sodium: 132 mmol/L — ABNORMAL LOW (ref 135–145)

## 2016-03-13 LAB — CBC WITH DIFFERENTIAL/PLATELET
BASOS ABS: 0 10*3/uL (ref 0.0–0.1)
BASOS PCT: 0 %
EOS ABS: 0.2 10*3/uL (ref 0.0–0.7)
EOS PCT: 2 %
HCT: 39.3 % (ref 36.0–46.0)
Hemoglobin: 12.9 g/dL (ref 12.0–15.0)
LYMPHS PCT: 25 %
Lymphs Abs: 2.9 10*3/uL (ref 0.7–4.0)
MCH: 28.5 pg (ref 26.0–34.0)
MCHC: 32.8 g/dL (ref 30.0–36.0)
MCV: 86.9 fL (ref 78.0–100.0)
MONO ABS: 0.5 10*3/uL (ref 0.1–1.0)
Monocytes Relative: 4 %
Neutro Abs: 7.9 10*3/uL — ABNORMAL HIGH (ref 1.7–7.7)
Neutrophils Relative %: 69 %
PLATELETS: 177 10*3/uL (ref 150–400)
RBC: 4.52 MIL/uL (ref 3.87–5.11)
RDW: 13.4 % (ref 11.5–15.5)
WBC: 11.5 10*3/uL — ABNORMAL HIGH (ref 4.0–10.5)

## 2016-03-13 LAB — I-STAT CG4 LACTIC ACID, ED: Lactic Acid, Venous: 1.21 mmol/L (ref 0.5–1.9)

## 2016-03-13 MED ORDER — LIDOCAINE 5 % EX PTCH
1.0000 | MEDICATED_PATCH | CUTANEOUS | Status: DC
  Administered 2016-03-13: 1 via TRANSDERMAL
  Filled 2016-03-13: qty 1

## 2016-03-13 MED ORDER — HYDROCODONE-ACETAMINOPHEN 5-325 MG PO TABS: 1.0000 | ORAL_TABLET | ORAL | 0 refills | Status: DC | PRN

## 2016-03-13 MED ORDER — FENTANYL CITRATE (PF) 100 MCG/2ML IJ SOLN
50.0000 ug | Freq: Once | INTRAMUSCULAR | Status: AC
  Administered 2016-03-13: 50 ug via INTRAVENOUS
  Filled 2016-03-13: qty 2

## 2016-03-13 MED ORDER — LIDOCAINE 5 % EX PTCH: 1.0000 | MEDICATED_PATCH | CUTANEOUS | 0 refills | Status: DC

## 2016-03-13 MED ORDER — CYCLOBENZAPRINE HCL 10 MG PO TABS
10.0000 mg | ORAL_TABLET | Freq: Once | ORAL | Status: AC
  Administered 2016-03-13: 10 mg via ORAL
  Filled 2016-03-13: qty 1

## 2016-03-13 MED ORDER — CYCLOBENZAPRINE HCL 10 MG PO TABS: 10.0000 mg | ORAL_TABLET | Freq: Two times a day (BID) | ORAL | 0 refills | Status: DC | PRN
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
## 2016-03-13 NOTE — ED Notes (Signed)
Pt transported to CT ?
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
## 2016-03-13 NOTE — ED Notes (Signed)
Waiting on MED. From pharmacy

## 2016-03-13 NOTE — ED Triage Notes (Signed)
Fractured right side rib 5 weeks ago and states not getting better. States not able to take good breaths. Been coughing. States pain is not getting better. No signs of respiratory distress at this time.

## 2016-03-13 NOTE — ED Notes (Signed)
Pt could not lie still for CT scan. Complained of SOB.

## 2016-03-15 ENCOUNTER — Other Ambulatory Visit (HOSPITAL_COMMUNITY): Payer: Self-pay | Admitting: Psychiatry

## 2016-03-15 DIAGNOSIS — F321 Major depressive disorder, single episode, moderate: Secondary | ICD-10-CM

## 2016-03-17 ENCOUNTER — Ambulatory Visit (INDEPENDENT_AMBULATORY_CARE_PROVIDER_SITE_OTHER): Payer: Medicare Other | Admitting: Family Medicine

## 2016-03-17 ENCOUNTER — Encounter: Payer: Self-pay | Admitting: Family Medicine

## 2016-03-17 ENCOUNTER — Telehealth (HOSPITAL_COMMUNITY): Payer: Self-pay | Admitting: *Deleted

## 2016-03-17 ENCOUNTER — Telehealth: Payer: Self-pay

## 2016-03-17 VITALS — BP 105/68 | HR 94 | Temp 98.2°F | Resp 18 | Wt 171.0 lb

## 2016-03-17 DIAGNOSIS — R635 Abnormal weight gain: Secondary | ICD-10-CM | POA: Diagnosis not present

## 2016-03-17 DIAGNOSIS — S2249XA Multiple fractures of ribs, unspecified side, initial encounter for closed fracture: Secondary | ICD-10-CM | POA: Insufficient documentation

## 2016-03-17 DIAGNOSIS — R0602 Shortness of breath: Secondary | ICD-10-CM

## 2016-03-17 DIAGNOSIS — C499 Malignant neoplasm of connective and soft tissue, unspecified: Secondary | ICD-10-CM

## 2016-03-17 DIAGNOSIS — I5023 Acute on chronic systolic (congestive) heart failure: Secondary | ICD-10-CM | POA: Diagnosis not present

## 2016-03-17 DIAGNOSIS — M8000XG Age-related osteoporosis with current pathological fracture, unspecified site, subsequent encounter for fracture with delayed healing: Secondary | ICD-10-CM

## 2016-03-17 DIAGNOSIS — Z853 Personal history of malignant neoplasm of breast: Secondary | ICD-10-CM

## 2016-03-17 DIAGNOSIS — M810 Age-related osteoporosis without current pathological fracture: Secondary | ICD-10-CM | POA: Insufficient documentation

## 2016-03-17 DIAGNOSIS — I509 Heart failure, unspecified: Secondary | ICD-10-CM

## 2016-03-17 MED ORDER — HYDROCODONE-ACETAMINOPHEN 10-325 MG PO TABS: 1.0000 | ORAL_TABLET | Freq: Three times a day (TID) | ORAL | 0 refills | Status: DC | PRN

## 2016-03-17 NOTE — Progress Notes (Signed)
Pre visit review using our clinic review tool, if applicable. No additional management support is needed unless otherwise documented below in the visit note. 

## 2016-03-17 NOTE — Telephone Encounter (Signed)
Met with Dr. Adele Schilder who approved a one time 30 day order for patient's prescribed Effexor XR 75 mg, one a day to take with his 150 mg dosage and a new Hydroxyzine 50 mg order.  Both orders e-scribed to patient's [facility] as approved by Dr. Adele Schilder.
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
## 2016-03-17 NOTE — Patient Instructions (Addendum)
Call cardiology today to tell them of the weight gain.    Rib Fracture A rib fracture is a break or crack in one of the bones of the ribs. The ribs are a group of long, curved bones that wrap around your chest and attach to your spine. They protect your lungs and other organs in the chest cavity. A broken or cracked rib is often painful, but most do not cause other problems. Most rib fractures heal on their own over time. However, rib fractures can be more serious if multiple ribs are broken or if broken ribs move out of place and push against other structures. What are the causes?  A direct blow to the chest. For example, this could happen during contact sports, a car accident, or a fall against a hard object.  Repetitive movements with high force, such as pitching a baseball or having severe coughing spells. What are the signs or symptoms?  Pain when you breathe in or cough.  Pain when someone presses on the injured area. How is this diagnosed? Your caregiver will perform a physical exam. Various imaging tests may be ordered to confirm the diagnosis and to look for related injuries. These tests may include a chest X-ray, computed tomography (CT), magnetic resonance imaging (MRI), or a bone scan. How is this treated? Rib fractures usually heal on their own in 1-3 months. The longer healing period is often associated with a continued cough or other aggravating activities. During the healing period, pain control is very important. Medication is usually given to control pain. Hospitalization or surgery may be needed for more severe injuries, such as those in which multiple ribs are broken or the ribs have moved out of place. Follow these instructions at home:  Avoid strenuous activity and any activities or movements that cause pain. Be careful during activities and avoid bumping the injured rib.  Gradually increase activity as directed by your caregiver.  Only take over-the-counter or  prescription medications as directed by your caregiver. Do not take other medications without asking your caregiver first.  Apply ice to the injured area for the first 1-2 days after you have been treated or as directed by your caregiver. Applying ice helps to reduce inflammation and pain.  Put ice in a plastic bag.  Place a towel between your skin and the bag.  Leave the ice on for 15-20 minutes at a time, every 2 hours while you are awake.  Perform deep breathing as directed by your caregiver. This will help prevent pneumonia, which is a common complication of a broken rib. Your caregiver may instruct you to:  Take deep breaths several times a day.  Try to cough several times a day, holding a pillow against the injured area.  Use a device called an incentive spirometer to practice deep breathing several times a day.  Drink enough fluids to keep your urine clear or pale yellow. This will help you avoid constipation.  Do not wear a rib belt or binder. These restrict breathing, which can lead to pneumonia. Get help right away if:  You have a fever.  You have difficulty breathing or shortness of breath.  You develop a continual cough, or you cough up thick or bloody sputum.  You feel sick to your stomach (nausea), throw up (vomit), or have abdominal pain.  You have worsening pain not controlled with medications. This information is not intended to replace advice given to you by your health care provider. Make sure you discuss  any questions you have with your health care provider. Document Released: 02/13/2005 Document Revised: 07/22/2015 Document Reviewed: 04/17/2012 Elsevier Interactive Patient Education  2017 [employer].

## 2016-03-17 NOTE — Telephone Encounter (Signed)
Patient called stating that she needed a stronger narcotic.  She said that she went to ER over the weekend and that she had broken additional ribs and the medication she was given was not strong enough. Patient was informed that she would have to schedule an appointment to see Dr. Raoul Pitch. Patient disconnected.  Patient called back and scheduled an appt. With [employer].

## 2016-03-17 NOTE — Telephone Encounter (Signed)
Patient called reporting weight gain of 15 lbs with increase shortness of breath.  Patient said she had recently fallen and has 7 fractured ribs so her activity and decreased.    I reported this to Oda Kilts, [place] and he advises patient to increase her lasix to 80 mg for the next three days and to call us Monday if she is still having symptoms.  Also he wants her to come in for lab work next week.   I called patient back and she is agreeable with plan and I have made her an appointment for labs next Thursday.  BMET order placed in epic.

## 2016-03-17 NOTE — Telephone Encounter (Signed)
Patient called stating that she went to ER and was told that she had broken more ribs and was needing stronger medication; the medication she was given wasn't strong enough. Patient was instructed to schedule an appointment to discuss with Dr. Raoul Pitch and disconnected the line.  Patient called back after 5 minutes and scheduled appointment with Lattie Haw. Dr. Raoul Pitch was informed.

## 2016-03-21 ENCOUNTER — Ambulatory Visit (HOSPITAL_BASED_OUTPATIENT_CLINIC_OR_DEPARTMENT_OTHER): Payer: Medicare Other

## 2016-03-22 ENCOUNTER — Telehealth: Payer: Self-pay | Admitting: Family Medicine

## 2016-03-22 ENCOUNTER — Ambulatory Visit (HOSPITAL_BASED_OUTPATIENT_CLINIC_OR_DEPARTMENT_OTHER)
Admission: RE | Admit: 2016-03-22 | Discharge: 2016-03-22 | Disposition: A | Discharge status: Home / Self Care | Payer: Medicare Other | Source: Ambulatory Visit | Attending: Family Medicine | Admitting: Family Medicine

## 2016-03-22 ENCOUNTER — Ambulatory Visit (HOSPITAL_BASED_OUTPATIENT_CLINIC_OR_DEPARTMENT_OTHER): Payer: Medicare Other

## 2016-03-22 ENCOUNTER — Ambulatory Visit (HOSPITAL_BASED_OUTPATIENT_CLINIC_OR_DEPARTMENT_OTHER): Payer: Medicare Other | Admitting: Oncology

## 2016-03-22 VITALS — BP 106/62 | HR 114 | Temp 98.5°F | Resp 18 | Ht 65.0 in | Wt 170.8 lb

## 2016-03-22 DIAGNOSIS — M8000XG Age-related osteoporosis with current pathological fracture, unspecified site, subsequent encounter for fracture with delayed healing: Secondary | ICD-10-CM

## 2016-03-22 DIAGNOSIS — C493 Malignant neoplasm of connective and soft tissue of thorax: Secondary | ICD-10-CM

## 2016-03-22 DIAGNOSIS — I700 Atherosclerosis of aorta: Secondary | ICD-10-CM | POA: Insufficient documentation

## 2016-03-22 DIAGNOSIS — S2249XA Multiple fractures of ribs, unspecified side, initial encounter for closed fracture: Secondary | ICD-10-CM | POA: Insufficient documentation

## 2016-03-22 DIAGNOSIS — R0602 Shortness of breath: Secondary | ICD-10-CM | POA: Diagnosis not present

## 2016-03-22 DIAGNOSIS — R635 Abnormal weight gain: Secondary | ICD-10-CM | POA: Diagnosis not present

## 2016-03-22 DIAGNOSIS — I5023 Acute on chronic systolic (congestive) heart failure: Secondary | ICD-10-CM | POA: Diagnosis not present

## 2016-03-22 DIAGNOSIS — Z853 Personal history of malignant neoplasm of breast: Secondary | ICD-10-CM

## 2016-03-22 DIAGNOSIS — R918 Other nonspecific abnormal finding of lung field: Secondary | ICD-10-CM

## 2016-03-22 DIAGNOSIS — C499 Malignant neoplasm of connective and soft tissue, unspecified: Secondary | ICD-10-CM | POA: Diagnosis not present

## 2016-03-22 DIAGNOSIS — J9811 Atelectasis: Secondary | ICD-10-CM

## 2016-03-22 DIAGNOSIS — S2249XS Multiple fractures of ribs, unspecified side, sequela: Secondary | ICD-10-CM

## 2016-03-22 DIAGNOSIS — J418 Mixed simple and mucopurulent chronic bronchitis: Secondary | ICD-10-CM

## 2016-03-22 DIAGNOSIS — I2510 Atherosclerotic heart disease of native coronary artery without angina pectoris: Secondary | ICD-10-CM | POA: Diagnosis not present

## 2016-03-22 DIAGNOSIS — F17200 Nicotine dependence, unspecified, uncomplicated: Secondary | ICD-10-CM

## 2016-03-22 DIAGNOSIS — J42 Unspecified chronic bronchitis: Secondary | ICD-10-CM | POA: Insufficient documentation

## 2016-03-22 LAB — COMPREHENSIVE METABOLIC PANEL
ALBUMIN: 3.5 g/dL (ref 3.5–5.0)
ALK PHOS: 90 U/L (ref 40–150)
ALT: 15 U/L (ref 0–55)
AST: 13 U/L (ref 5–34)
Anion Gap: 12 mEq/L — ABNORMAL HIGH (ref 3–11)
BILIRUBIN TOTAL: 0.27 mg/dL (ref 0.20–1.20)
BUN: 27.7 mg/dL — ABNORMAL HIGH (ref 7.0–26.0)
CO2: 27 mEq/L (ref 22–29)
Calcium: 9.6 mg/dL (ref 8.4–10.4)
Chloride: 95 mEq/L — ABNORMAL LOW (ref 98–109)
Creatinine: 1.2 mg/dL — ABNORMAL HIGH (ref 0.6–1.1)
EGFR: 51 mL/min/{1.73_m2} — ABNORMAL LOW (ref >90)
GLUCOSE: 205 mg/dL — AB (ref 70–140)
Potassium: 5.1 mEq/L (ref 3.5–5.1)
SODIUM: 134 meq/L — AB (ref 136–145)
TOTAL PROTEIN: 7.4 g/dL (ref 6.4–8.3)

## 2016-03-22 MED ORDER — LETROZOLE 2.5 MG PO TABS: 2.5000 mg | ORAL_TABLET | Freq: Every morning | ORAL | 2 refills | Status: DC

## 2016-03-22 NOTE — Telephone Encounter (Signed)
Spoke with patient reviewed information and instructions. Patient states she had CT done today. Informed patient we would call her with results when we get them and any further instructions. Advised patient if pain gets worse to go to the emergency room. Patient hung up during phone call.

## 2016-03-22 NOTE — Telephone Encounter (Signed)
Receive notification patient walked into the office angry today.  - Please call patient, after her last appointment I had doubled strength of her pain medication. CT of her chest was ordered to evaluate her broken ribs. She canceled the imaging study, because she cannot lay flat. - I have concerns surrounding this condition, especially since she has had weight gain, and difficulty breathing, which could all indicate internal damage and not just rib fractures. There must be a CT in order to evaluate further. - I am sorry she is in pain, but we cannot increase any pain medications until she has her CT scan. This is  for her safety.  - I Would advise her to go to the emergency room, received IV pain meds so that  the chest CT can be obtained and  her condition can be dealt with appropriately.

## 2016-03-22 NOTE — Telephone Encounter (Signed)
attempted to call pt back by both #'s provided and was not able to reach her. Her CT of chest has concerning results that need to be discussed. She has new pulm nodules and changes within her lungs. Given her history, I have referred her to pulmonology ASAP to weigh in and provide recommendations for her.  I am ok with given her a stronger pain medicine, short course to help with pain, but she will need follow up if she feels additional refills to be evaluated.  If she is agreeable, I will place a script out front for her to pick up.  I would like to speak with her, by phone is ok, to explain the CT in more detail.

## 2016-03-22 NOTE — Telephone Encounter (Signed)
Patient walked into office today because she said she called earlier and wanted to get a stronger pain medication.  I told patient that Dr Raoul Pitch was full today and it did not appear that she had been able to get to that message yet.  Vinnie Level is aware that there is a message on her phone and she will get it to Dr Raoul Pitch as soon as she is able.  Patient states that she "is very pissed because she doesn't get out this way very often" and she stormed out.

## 2016-03-22 NOTE — Progress Notes (Signed)
Hematology and Oncology Follow Up Visit  Emily [name] [id number] 12-14-1960 56 y.o. 03/22/2016 10:58 AM Renee Raoul Pitch, DOKuneff, Renee A, DO   Principle Diagnosis: 56 year old woman with the following issues:  1. Leiomyosarcoma of the right chest wall. She is status post excision of 3.5 x 2.0 x 1.7 cm lesion with positive margins in April 2017. 2. Breast cancer diagnosed in 2009. At that time she had a stage IIIc disease with 3.1 cm tumor and 5 out of 7 lymph nodes involved. Her tumor was ER/PR positive HER-2 negative.  3. Nonischemic cardiomyopathy related to Adriamycin exposure.  Prior Therapy:   She was treated with Adriamycin and cyclophosphamide in a dose dense fashion and weekly Taxol. She received radiation after lumpectomy and tamoxifen and subsequently Arimidex and subsequently letrozole.  She is status post wide excision of her right chest wall leiomyosarcoma done on 12/28/2015. The pathology showed no residual malignancy.  Current therapy: Letrozole 2.5 mg daily since 2010.  Interim History: Emily [name] presents today for a follow-up visit. Since the last visit, she underwent reexcision of her right chest wall sarcoma done on 12/28/2015. This was completed by Dr. Barry Dienes without complications. The pathology showed no residual malignancy. Since that surgery she recovered fairly well. She still experiencing unsteadiness with her gait and had multiple falls. She is scheduled to have repeat CT scan of the chest to evaluate broken ribs.   She continues to take letrozole without any recent complications. She continues to be reasonably active and attends activities of daily living.  She does not report any headaches, blurry vision, syncope or seizures. She does not report any fevers or chills or sweats. She does not report any cough, wheezing or hemoptysis. She does not report any nausea, vomiting or abdominal pain. She does not report any frequency urgency or hesitancy. She does not report any  skeletal complaints. Remaining review of systems unremarkable.   Medications: I have reviewed the patient's current medications.  Current Outpatient Prescriptions  Medication Sig Dispense Refill  . albuterol (PROVENTIL HFA;VENTOLIN HFA) 108 (90 BASE) MCG/ACT inhaler Inhale 2 puffs into the lungs every 6 (six) hours as needed for wheezing or shortness of breath. 1 Inhaler 0  . aspirin EC 81 MG tablet Take 1 tablet (81 mg total) by mouth daily. 90 tablet 3  . atorvastatin (LIPITOR) 20 MG tablet Take 1 tablet (20 mg total) by mouth daily. 90 tablet 2  . carvedilol (COREG) 6.25 MG tablet Take 1 tablet (6.25 mg total) by mouth 2 (two) times daily. 180 tablet 3  . Cholecalciferol (VITAMIN D) 2000 UNITS CAPS Take 2,000 Units by mouth daily.     . cyclobenzaprine (FLEXERIL) 10 MG tablet Take 1 tablet (10 mg total) by mouth 2 (two) times daily as needed for muscle spasms. 20 tablet 0  . furosemide (LASIX) 40 MG tablet Take 1 tablet (40 mg total) by mouth daily. 90 tablet 2  . gabapentin (NEURONTIN) 400 MG capsule Take 2 capsules (800 mg total) by mouth 3 (three) times daily. (Patient taking differently: Take 800 mg by mouth 2 (two) times daily. ) 180 capsule 2  . glipiZIDE (GLUCOTROL) 10 MG tablet Take 5 mg in am and 10 mg before dinner (Patient taking differently: Take 5-10 mg by mouth See admin instructions. Take 5 mg in am and 10 mg before dinner) 180 tablet 1  . HYDROcodone-acetaminophen (NORCO) 10-325 MG tablet Take 1 tablet by mouth every 8 (eight) hours as needed. 30 tablet 0  . hydrOXYzine (  VISTARIL) 50 MG capsule TAKE ONE CAPSULE BY MOUTH AT BEDTIME AS NEEDED AND MAY REPEAT DOSE ONE TIME IF NEEDED FOR ITCHING 60 capsule 0  . insulin NPH Human (HUMULIN N,NOVOLIN N) 100 UNIT/ML injection Inject 10 units in am and 15 units at bedtime - Relion insulin 10 mL 11  . letrozole (FEMARA) 2.5 MG tablet Take 1 tablet (2.5 mg total) by mouth every morning. 90 tablet 2  . lidocaine (LIDODERM) 5 % Place 1 patch  onto the skin daily. Remove & Discard patch within 12 hours or as directed by MD 4 patch 0  . loperamide (IMODIUM) 2 MG capsule Take 1 capsule (2 mg total) by mouth daily as needed for diarrhea or loose stools. 90 capsule 0  . magnesium oxide (MAG-OX) 400 MG tablet Take 1 tablet (400 mg total) by mouth 2 (two) times daily. 60 tablet 5  . metFORMIN (GLUCOPHAGE) 1000 MG tablet Take 1 tablet (1,000 mg total) by mouth 2 (two) times daily with a meal. 180 tablet 1  . sacubitril-valsartan (ENTRESTO) 97-103 MG Take 1 tablet by mouth 2 (two) times daily. 180 tablet 3  . senna (SENOKOT) 8.6 MG tablet Take 1 tablet by mouth daily as needed for constipation.     Marland Kitchen spironolactone (ALDACTONE) 25 MG tablet Take 1 tablet (25 mg total) by mouth daily. 90 tablet 2  . venlafaxine XR (EFFEXOR-XR) 150 MG 24 hr capsule Take 150 mg by mouth daily with breakfast.    . venlafaxine XR (EFFEXOR-XR) 75 MG 24 hr capsule TAKE ONE CAPSULE BY MOUTH ONCE DAILY TAKE  WITH  EFFEXOR  150  MG 30 capsule 0   No current facility-administered medications for this visit.      Allergies:  Allergies  Allergen Reactions  . Contrast Media [Iodinated Diagnostic Agents] Anaphylaxis  . Gadolinium Shortness Of Breath and Other (See Comments)     Code: SOB, Onset Date: 55732202     Past Medical History, Surgical history, Social history, and Family History were reviewed and updated.   Physical Exam: Blood pressure 106/62, pulse (!) 114, temperature 98.5 F (36.9 C), temperature source Oral, resp. rate 18, height _0  (1.651 m), weight 170 lb 12.8 oz (77.5 kg), SpO2 95 %.    ECOG: 1 General appearance: alert and cooperative appeared without distress. Head: Normocephalic, without obvious abnormality no oral ulcers or lesions. Neck: no adenopathy Lymph nodes: Cervical, supraclavicular, and axillary nodes normal. Heart:regular rate and rhythm, S1, S2 normal, no murmur, click, rub or gallop Lung:chest clear, no wheezing, rales,  normal symmetric air entry Chest wall examination showed well healed scars without any lesions or ulcers. Abdomin: soft, non-tender, without masses or organomegaly EXT:no erythema, induration, or nodules   Lab Results: Lab Results  Component Value Date   WBC 11.5 (H) 03/13/2016   HGB 12.9 03/13/2016   HCT 39.3 03/13/2016   MCV 86.9 03/13/2016   PLT 177 03/13/2016     Chemistry      Component Value Date/Time   NA 132 (L) 03/13/2016 1520   NA 136 04/21/2013 1159   K 4.9 03/13/2016 1520   K 4.3 06/21/2012   CL 97 (L) 03/13/2016 1520   CL 102 06/21/2012   CO2 24 03/13/2016 1520   CO2 27 06/21/2012   BUN 18 03/13/2016 1520   BUN 14 04/21/2013 1159   CREATININE 0.71 03/13/2016 1520   CREATININE 0.79 09/30/2014 0001      Component Value Date/Time   CALCIUM 9.3 03/13/2016 1520  CALCIUM 10.2 06/21/2012   ALKPHOS 51 12/23/2015 1523   AST 28 12/23/2015 1523   AST 57 06/21/2012   ALT 26 12/23/2015 1523   BILITOT 0.5 12/23/2015 1523   BILITOT 0.7 06/21/2012          Impression and Plan:  56 year old woman with the following issues:  1. Leiomyosarcoma of the right chest wall. She is status post excision of 3.5 x 2.0 x 1.7 cm lesion with positive margins. This is in the setting of a history of breast cancer and breast radiation completed in 2009.  CT scan on 09/16/2015 showed no evidence of metastatic disease. Her case was discussed and the sarcoma tumor Board recommended surgical reexcision that was completed in October 2017.  The plan is to continue with active surveillance at this time without any further intervention.   2. History of breast cancer: Diagnosed in 2009 with a stage IIIc. She is status post adjuvant chemotherapy initially with chemotherapy, radiation therapy and currently on adjuvant hormonal therapy for the last 7 years. I have recommended continuing Letrozole  for at least 3 years and she has no objections to that. Her last mammogram was obtained on  10/08/2015 and showed no abnormalities.  She continues to be relatively resolved for the time being to complete a total of 10 years and then discontinue after that.  3. Follow-up: Will be in 6 months.  APOLID,CVUDT, MD 1/24/201810:58 AM

## 2016-03-22 NOTE — Telephone Encounter (Signed)
Patient left message on voice mail stating she wants stronger pain medication. Due to patient volume this am patient walked in to office prior to [place] being able to address her phone contact.  Please advise.

## 2016-03-23 ENCOUNTER — Other Ambulatory Visit (HOSPITAL_COMMUNITY): Payer: Self-pay

## 2016-03-23 ENCOUNTER — Telehealth: Payer: Self-pay | Admitting: Family Medicine

## 2016-03-23 DIAGNOSIS — S2249XS Multiple fractures of ribs, unspecified side, sequela: Secondary | ICD-10-CM

## 2016-03-23 MED ORDER — OXYCODONE-ACETAMINOPHEN 5-325 MG PO TABS: 1.0000 | ORAL_TABLET | Freq: Three times a day (TID) | ORAL | 0 refills | Status: DC | PRN

## 2016-03-23 NOTE — Telephone Encounter (Signed)
Percocet 5-325, q 8 hr PRN, #30- for acute multiple rib fx ordered and placed at front desk for her to pick up. This medication is to replace the Vicodin she was written and was not able to gain pain control.  If refill requested, will need appt.

## 2016-03-23 NOTE — Telephone Encounter (Signed)
Tried to contact patient voice mail not set up 

## 2016-03-23 NOTE — Telephone Encounter (Signed)
Spoke with patient she does want a stronger pain medication. Reviewed Ct results and instructions with patient she verbalized understanding . Informed patient to expect call to set up Pulmonology appt.

## 2016-03-28 ENCOUNTER — Ambulatory Visit (INDEPENDENT_AMBULATORY_CARE_PROVIDER_SITE_OTHER): Payer: Medicare Other | Admitting: Psychiatry

## 2016-03-28 ENCOUNTER — Encounter (HOSPITAL_COMMUNITY): Payer: Self-pay | Admitting: Psychiatry

## 2016-03-28 DIAGNOSIS — Z7982 Long term (current) use of aspirin: Secondary | ICD-10-CM

## 2016-03-28 DIAGNOSIS — Z841 Family history of disorders of kidney and ureter: Secondary | ICD-10-CM

## 2016-03-28 DIAGNOSIS — F17210 Nicotine dependence, cigarettes, uncomplicated: Secondary | ICD-10-CM

## 2016-03-28 DIAGNOSIS — F321 Major depressive disorder, single episode, moderate: Secondary | ICD-10-CM | POA: Diagnosis not present

## 2016-03-28 DIAGNOSIS — Z90710 Acquired absence of both cervix and uterus: Secondary | ICD-10-CM | POA: Diagnosis not present

## 2016-03-28 DIAGNOSIS — Z803 Family history of malignant neoplasm of breast: Secondary | ICD-10-CM

## 2016-03-28 DIAGNOSIS — Z8052 Family history of malignant neoplasm of bladder: Secondary | ICD-10-CM

## 2016-03-28 DIAGNOSIS — Z9049 Acquired absence of other specified parts of digestive tract: Secondary | ICD-10-CM | POA: Diagnosis not present

## 2016-03-28 DIAGNOSIS — Z833 Family history of diabetes mellitus: Secondary | ICD-10-CM

## 2016-03-28 DIAGNOSIS — Z9889 Other specified postprocedural states: Secondary | ICD-10-CM | POA: Diagnosis not present

## 2016-03-28 DIAGNOSIS — Z811 Family history of alcohol abuse and dependence: Secondary | ICD-10-CM

## 2016-03-28 DIAGNOSIS — Z8041 Family history of malignant neoplasm of ovary: Secondary | ICD-10-CM

## 2016-03-28 DIAGNOSIS — Z888 Allergy status to other drugs, medicaments and biological substances status: Secondary | ICD-10-CM

## 2016-03-28 DIAGNOSIS — Z79891 Long term (current) use of opiate analgesic: Secondary | ICD-10-CM

## 2016-03-28 DIAGNOSIS — Z794 Long term (current) use of insulin: Secondary | ICD-10-CM

## 2016-03-28 DIAGNOSIS — Z8261 Family history of arthritis: Secondary | ICD-10-CM

## 2016-03-28 DIAGNOSIS — Z79899 Other long term (current) drug therapy: Secondary | ICD-10-CM

## 2016-03-28 MED ORDER — HYDROXYZINE PAMOATE 50 MG PO CAPS: ORAL_CAPSULE | ORAL | 0 refills | Status: DC

## 2016-03-28 MED ORDER — VENLAFAXINE HCL ER 75 MG PO CP24: ORAL_CAPSULE | ORAL | 0 refills | Status: DC
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
## 2016-03-29 ENCOUNTER — Ambulatory Visit: Payer: Self-pay | Admitting: Internal Medicine

## 2016-03-30 DIAGNOSIS — L821 Other seborrheic keratosis: Secondary | ICD-10-CM | POA: Diagnosis not present

## 2016-03-30 DIAGNOSIS — D485 Neoplasm of uncertain behavior of skin: Secondary | ICD-10-CM | POA: Diagnosis not present

## 2016-03-30 DIAGNOSIS — L570 Actinic keratosis: Secondary | ICD-10-CM | POA: Diagnosis not present

## 2016-03-30 DIAGNOSIS — C44729 Squamous cell carcinoma of skin of left lower limb, including hip: Secondary | ICD-10-CM | POA: Diagnosis not present

## 2016-03-30 DIAGNOSIS — D1801 Hemangioma of skin and subcutaneous tissue: Secondary | ICD-10-CM | POA: Diagnosis not present

## 2016-03-30 DIAGNOSIS — L814 Other melanin hyperpigmentation: Secondary | ICD-10-CM | POA: Diagnosis not present

## 2016-03-31 DIAGNOSIS — D099 Carcinoma in situ, unspecified: Secondary | ICD-10-CM

## 2016-03-31 HISTORY — DX: Carcinoma in situ, unspecified: D09.9

## 2016-04-03 ENCOUNTER — Ambulatory Visit (HOSPITAL_COMMUNITY): Payer: Self-pay | Admitting: Clinical

## 2016-04-03 ENCOUNTER — Telehealth: Payer: Self-pay | Admitting: Oncology

## 2016-04-03 NOTE — Telephone Encounter (Signed)
Spoke with patient and confirmed appointment in July

## 2016-04-05 ENCOUNTER — Encounter (HOSPITAL_COMMUNITY): Payer: Self-pay

## 2016-04-17 ENCOUNTER — Encounter: Payer: Self-pay | Admitting: Physical Medicine & Rehabilitation

## 2016-04-21 ENCOUNTER — Institutional Professional Consult (permissible substitution): Payer: Self-pay | Admitting: Pulmonary Disease

## 2016-04-21 ENCOUNTER — Institutional Professional Consult (permissible substitution): Payer: Self-pay | Admitting: Internal Medicine

## 2016-04-24 DIAGNOSIS — D0472 Carcinoma in situ of skin of left lower limb, including hip: Secondary | ICD-10-CM | POA: Diagnosis not present

## 2016-04-24 DIAGNOSIS — L570 Actinic keratosis: Secondary | ICD-10-CM | POA: Diagnosis not present

## 2016-04-25 ENCOUNTER — Encounter: Payer: Self-pay | Admitting: Family Medicine

## 2016-05-03 ENCOUNTER — Ambulatory Visit (HOSPITAL_COMMUNITY)
Admission: RE | Admit: 2016-05-03 | Discharge: 2016-05-03 | Disposition: A | Discharge status: Home / Self Care | Payer: Medicare Other | Source: Ambulatory Visit | Attending: Cardiology | Admitting: Cardiology

## 2016-05-03 ENCOUNTER — Encounter (HOSPITAL_COMMUNITY): Payer: Self-pay

## 2016-05-03 VITALS — BP 92/60 | HR 107 | Wt 168.2 lb

## 2016-05-03 DIAGNOSIS — I2584 Coronary atherosclerosis due to calcified coronary lesion: Secondary | ICD-10-CM

## 2016-05-03 DIAGNOSIS — I5022 Chronic systolic (congestive) heart failure: Secondary | ICD-10-CM | POA: Diagnosis not present

## 2016-05-03 DIAGNOSIS — Z79899 Other long term (current) drug therapy: Secondary | ICD-10-CM | POA: Insufficient documentation

## 2016-05-03 DIAGNOSIS — I2510 Atherosclerotic heart disease of native coronary artery without angina pectoris: Secondary | ICD-10-CM | POA: Diagnosis not present

## 2016-05-03 DIAGNOSIS — F17210 Nicotine dependence, cigarettes, uncomplicated: Secondary | ICD-10-CM | POA: Diagnosis not present

## 2016-05-03 DIAGNOSIS — R002 Palpitations: Secondary | ICD-10-CM | POA: Insufficient documentation

## 2016-05-03 DIAGNOSIS — I493 Ventricular premature depolarization: Secondary | ICD-10-CM | POA: Diagnosis not present

## 2016-05-03 DIAGNOSIS — Z853 Personal history of malignant neoplasm of breast: Secondary | ICD-10-CM | POA: Diagnosis not present

## 2016-05-03 DIAGNOSIS — E785 Hyperlipidemia, unspecified: Secondary | ICD-10-CM | POA: Diagnosis not present

## 2016-05-03 DIAGNOSIS — Z7982 Long term (current) use of aspirin: Secondary | ICD-10-CM | POA: Diagnosis not present

## 2016-05-03 DIAGNOSIS — E119 Type 2 diabetes mellitus without complications: Secondary | ICD-10-CM | POA: Diagnosis not present

## 2016-05-03 DIAGNOSIS — J449 Chronic obstructive pulmonary disease, unspecified: Secondary | ICD-10-CM | POA: Diagnosis not present

## 2016-05-03 DIAGNOSIS — Z794 Long term (current) use of insulin: Secondary | ICD-10-CM | POA: Insufficient documentation

## 2016-05-03 DIAGNOSIS — I6529 Occlusion and stenosis of unspecified carotid artery: Secondary | ICD-10-CM | POA: Insufficient documentation

## 2016-05-03 DIAGNOSIS — Z90710 Acquired absence of both cervix and uterus: Secondary | ICD-10-CM | POA: Diagnosis not present

## 2016-05-03 DIAGNOSIS — I429 Cardiomyopathy, unspecified: Secondary | ICD-10-CM | POA: Insufficient documentation

## 2016-05-03 DIAGNOSIS — I5032 Chronic diastolic (congestive) heart failure: Secondary | ICD-10-CM | POA: Insufficient documentation

## 2016-05-03 DIAGNOSIS — Z9889 Other specified postprocedural states: Secondary | ICD-10-CM | POA: Diagnosis not present

## 2016-05-03 DIAGNOSIS — R296 Repeated falls: Secondary | ICD-10-CM | POA: Insufficient documentation

## 2016-05-03 LAB — BASIC METABOLIC PANEL
ANION GAP: 11 (ref 5–15)
BUN: 19 mg/dL (ref 6–20)
CHLORIDE: 100 mmol/L — AB (ref 101–111)
CO2: 25 mmol/L (ref 22–32)
Calcium: 9.5 mg/dL (ref 8.9–10.3)
Creatinine, Ser: 0.88 mg/dL (ref 0.44–1.00)
Glucose, Bld: 248 mg/dL — ABNORMAL HIGH (ref 65–99)
POTASSIUM: 4.9 mmol/L (ref 3.5–5.1)
SODIUM: 136 mmol/L (ref 135–145)

## 2016-05-03 LAB — BRAIN NATRIURETIC PEPTIDE: B NATRIURETIC PEPTIDE 5: 49.1 pg/mL (ref 0.0–100.0)

## 2016-05-03 NOTE — Patient Instructions (Signed)
Routine lab work today. Will notify you of abnormal results, otherwise no news is good news!  No changes to medication today.  Follow up with Oda Kilts PA-C in 4 months.  Do the following things EVERYDAY: 1) Weigh yourself in the morning before breakfast. Write it down and keep it in a log. 2) Take your medicines as prescribed 3) Eat low salt foods-Limit salt (sodium) to 2000 mg per day.  4) Stay as active as you can everyday 5) Limit all fluids for the day to less than 2 liters

## 2016-05-03 NOTE — Progress Notes (Signed)
Patient ID: [name], female   DOB: 1960/03/25, 56 y.o.   MRN: [id number]  ADVANCED HF CLINIC NOTE  Patient ID: RINI MOFFIT, female   DOB: Oct 06, 1960, 56 y.o.   MRN: [id number] PCP: Dr Kathlen Mody Primary Cardiologist: Dr. Haroldine Laws  Ms. Kitts is a 56 yo woman with a history of ER + R breast cancer s/p R lumpectomy, DM2, cholelithiasis s/p cholecystectomy (02/2013), COPD and systolic CHF due to nonischemic cardiomyopathy.  Patient was admitted in 09/2012 with acute pulmonary edema and cardiogenic shock.  LHC showed moderate nonobstructive CAD (40-50% mLAD, 50-70% LCx, 50% PDA).  She was started on milrinone and diuresed.  Cause of cardiomyopathy suspected to be Adriamycin toxicity.    Echo 10/27/12 EF 20-25% Echo 01/09/13 EF 30-35% Echo 06/05/13 EF 45-50% septum dysynnergic Echo 5/17: EF 15-20% Echo 12/01/15 LVEF 55-60%  12/05/12 Carotid Dopplers- R 39 % stenosis and L  40-59% stenosis  Had surgery 4/17 in [place] to remove R chest leiomyosarcoma. Margins not clear so needs larger resection. Saw Dr. Alen Blew who has ordered scans and has presented to Tumor Board. Pending appt with Dr. Barry Dienes 10/22/15.   She moved back from [place], [place] in May 2017 for recurrent HF symptome. Found to have EF 10-15% with NYHA IIIB-IV symptoms. Was admitted. UDS + cocaine. Cath showed non-obstructive CAD with Cardiogenic shock initial PA sat 34%, 42%. Started on milrinone. Diuresed about 5 pounds. Milrinone weaned off and co-ox dropped down to 52% but was feeling ok. Weight on d/c was 156.   Pt presents today for follow up: Echo at last visit with normalization of EF, so digoxin stopped.  Having has been feeling more SOB over the past couple of months.   Weight is up 10 lbs since her last visit, though that was in October.  She has fallen multiple times and fractured several ribs. SOB walking around up the steps. SOB at time changing clothes and bathing. Continues smoking 1 PPD. She hasn't tried any extra lasix. Sleeps  poorly chronically. Weight at home 165-168 lbs. Taking all medications as directed.   Cath 07/14/15 Ao = 106/72 (87) LV = 108/28 (36) RA = 22 RV = 60/14/25 PA = 67/22 (51) PCW = 31 Fick cardiac output/index = 2.8/1.6 SVR = 1823 PVR = 7.0 WU FA sat = 90% PA sat = 34%, 42%  Assessment: 1. Mild non-obstructive CAD 2. Severe NICM with EF 25% by echo 3. Cardiogenic shock physiology with markedly elevated biventricular filling pressures Lexiscan 09/23/13: EF 50% normal perfusion Holter: SR occasional PVCs  Labs (9/14): digoxin 0.4, K 3.6, creatinine 0.62 Labs (9/15): K 4.4 creatinine 0.68 Labs (10/26):    K 3.7 Cr 0.67 Labs: (03/20/2013): K+ 4.6, Cr 0.73 Labs (09/25/13): K 4.2 Cr 0.9  PMH: 1. COPD: PFTs (1/14) with FEV1 68%.  Quit smoking 9/14.  2. Type II diabetes  3. HTN 4. Breast cancer: 2009, s/p Adriamycin/Cytoxan/Taxol chemo, lumpectomy and radiation.   5. Nonischemic cardiomyopathy: Possibly due to Adriamycin.  Echo (8/14) with EF 20-25%, global hypokinesis, moderate diastolic dysfunction, normal RV size and with mild to moderately decreased systolic function, moderate TR.   6. CAD: LHC (8/14) with nonobstructive CAD; 40-50% mLAD, 50-70% LCx, 50% PDA.  7. Hyperlipidemia  SH: Lives alone in [place].  Mother is next door.  Still smoking.   FH: No premature CAD.  No family history of cardiomyopathy.   ROS: All systems reviewed and negative except as per HPI.   Current Outpatient Prescriptions  Medication  Sig Dispense Refill  . albuterol (PROVENTIL HFA;VENTOLIN HFA) 108 (90 BASE) MCG/ACT inhaler Inhale 2 puffs into the lungs every 6 (six) hours as needed for wheezing or shortness of breath. 1 Inhaler 0  . aspirin EC 81 MG tablet Take 1 tablet (81 mg total) by mouth daily. 90 tablet 3  . atorvastatin (LIPITOR) 20 MG tablet Take 1 tablet (20 mg total) by mouth daily. 90 tablet 2  . carvedilol (COREG) 6.25 MG tablet Take 1 tablet (6.25 mg total) by mouth 2 (two) times  daily. 180 tablet 3  . fluoruracil (CARAC) 0.5 % cream Apply 1 application topically at bedtime.    . furosemide (LASIX) 40 MG tablet Take 1 tablet (40 mg total) by mouth daily. 90 tablet 2  . gabapentin (NEURONTIN) 400 MG capsule Take 2 capsules (800 mg total) by mouth 3 (three) times daily. 180 capsule 2  . glipiZIDE (GLUCOTROL) 10 MG tablet Take 5 mg in am and 10 mg before dinner 180 tablet 1  . hydrOXYzine (VISTARIL) 50 MG capsule TAKE ONE CAPSULE BY MOUTH AT BEDTIME AS NEEDED 90 capsule 0  . insulin NPH Human (HUMULIN N,NOVOLIN N) 100 UNIT/ML injection Inject 10 units in am and 15 units at bedtime - Relion insulin 10 mL 11  . letrozole (FEMARA) 2.5 MG tablet Take 1 tablet (2.5 mg total) by mouth every morning. 90 tablet 2  . loperamide (IMODIUM) 2 MG capsule Take 1 capsule (2 mg total) by mouth daily as needed for diarrhea or loose stools. 90 capsule 0  . metFORMIN (GLUCOPHAGE) 1000 MG tablet Take 1 tablet (1,000 mg total) by mouth 2 (two) times daily with a meal. 180 tablet 1  . sacubitril-valsartan (ENTRESTO) 97-103 MG Take 1 tablet by mouth 2 (two) times daily. 180 tablet 3  . senna (SENOKOT) 8.6 MG tablet Take 1 tablet by mouth daily as needed for constipation.     Marland Kitchen spironolactone (ALDACTONE) 25 MG tablet Take 1 tablet (25 mg total) by mouth daily. 90 tablet 2  . venlafaxine XR (EFFEXOR-XR) 75 MG 24 hr capsule TAKE THREE CAPSULE DAILY 270 capsule 0   No current facility-administered medications for this encounter.     Vitals:   05/03/16 1407  BP: 92/60  Pulse: (!) 107  SpO2: 96%  Weight: 168 lb 4 oz (76.3 kg)   Wt Readings from Last 3 Encounters:  05/03/16 168 lb 4 oz (76.3 kg)  03/22/16 170 lb 12.8 oz (77.5 kg)  03/17/16 171 lb (77.6 kg)     General: Well appearing.  Neck: JVP 6-7 cm, no thyromegaly or thyroid nodule.  Lungs: CTAB, normal effort.   CV: Laterally displaced PMI.  Heart regular S1/S2, no S3/S4, no murmur. R subclavian scar Abdomen: Soft, NT, ND, no HSM. No  bruits or masses. +BS  Skin: Intact without lesions or rashes.  Neurologic: Alert and oriented x 3.  Psych: Normal affect. Extremities: No clubbing or cyanosis. No peripheral edema.  HEENT: Normal.   Assessment/Plan: 1. Chronic systolic CHF: NICM, likely Adriamycin toxicity,. EF had previously recovered but now way back down  20%.  - Admitted for cardiogenic shock 5/17. Required milrinone. Now off.  - Echo 12/01/15 with EF LVEF 55-60%   - NYHA III-IIIb. Volume status looks good on exam, so suspect most of this is from deconditioning s/p multiple falls with injuries.    -Continue Entresto 97/103. BMET today.  -Continue spiro 25 mg daily -Continue lasix 40 daily. Can take extra 40 mg as needed for  weight gain 3 lbs overnight or 5 lbs within one week.  - Continue carvedilol 6.25 BID - Reinforced fluid restriction to < 2 L daily, sodium restriction to less than 2000 mg daily, and the importance of daily weights.  - 2. CAD: Moderate nonobstructive disease. -Continue ASA and statin - No chest pain.  3. COPD/tobacco use - Continues smoking 1 PPD. Strongly encouraged to cut back.   4. Cocaine use - Denies use.  5. Breast Cancer: - Previously received adriamycin. Cancer free for > 5 years. 6. Carotid stenosis:  - Asymptomatic. Follow yearly U/S. Continue asa and statin.  7. R chest leiomyosarcoma: - Had further resections and margins now clear. Sees Dr. Alen Blew and Dr. Barry Dienes.  8. Palpitations:  - 30-day event monitor previously reviewed and without events.   Volume status looks OK. Can take extra lasix as needed. Deconditioned. Encouraged to increase activity as able. BMET and BNP today.   Shirley Friar, PA-C  2:15 PM   Total time spent > 25 minutes. Over half that spent discussing the above.

## 2016-05-19 ENCOUNTER — Ambulatory Visit (INDEPENDENT_AMBULATORY_CARE_PROVIDER_SITE_OTHER): Payer: Medicare Other | Admitting: Physician Assistant

## 2016-05-19 ENCOUNTER — Ambulatory Visit (INDEPENDENT_AMBULATORY_CARE_PROVIDER_SITE_OTHER): Payer: Medicare Other

## 2016-05-19 ENCOUNTER — Encounter: Payer: Self-pay | Admitting: Physician Assistant

## 2016-05-19 VITALS — BP 100/66 | HR 108 | Temp 97.5°F | Resp 14 | Ht 65.0 in | Wt 170.0 lb

## 2016-05-19 DIAGNOSIS — J209 Acute bronchitis, unspecified: Secondary | ICD-10-CM | POA: Diagnosis not present

## 2016-05-19 DIAGNOSIS — R062 Wheezing: Secondary | ICD-10-CM | POA: Diagnosis not present

## 2016-05-19 DIAGNOSIS — R0602 Shortness of breath: Secondary | ICD-10-CM | POA: Diagnosis not present

## 2016-05-19 DIAGNOSIS — J440 Chronic obstructive pulmonary disease with acute lower respiratory infection: Secondary | ICD-10-CM

## 2016-05-19 MED ORDER — ALBUTEROL SULFATE (2.5 MG/3ML) 0.083% IN NEBU
2.5000 mg | INHALATION_SOLUTION | Freq: Once | RESPIRATORY_TRACT | Status: AC
  Administered 2016-05-19: 2.5 mg via RESPIRATORY_TRACT

## 2016-05-19 MED ORDER — BENZONATATE 100 MG PO CAPS: 100.0000 mg | ORAL_CAPSULE | Freq: Two times a day (BID) | ORAL | 0 refills | Status: DC | PRN

## 2016-05-19 MED ORDER — DOXYCYCLINE HYCLATE 100 MG PO CAPS
100.0000 mg | ORAL_CAPSULE | Freq: Two times a day (BID) | ORAL | 0 refills | Status: DC
Start: 2016-05-19 — End: 2016-06-13

## 2016-05-19 MED ORDER — BECLOMETHASONE DIPROPIONATE 40 MCG/ACT IN AERS: 2.0000 | INHALATION_SPRAY | Freq: Two times a day (BID) | RESPIRATORY_TRACT | 12 refills | Status: DC

## 2016-05-19 NOTE — Progress Notes (Signed)
Pre visit review using our clinic review tool, if applicable. No additional management support is needed unless otherwise documented below in the visit note. 
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
## 2016-05-19 NOTE — Patient Instructions (Signed)
Please go for x-ray at the [facility] office. I will call you with your results so we can determine antibiotic.  Use the Qvar as directed. Continue plain Mucinex. Use the Tessalon as directed for cough. Keep hydrated.  Continue chronic medications as directed. Follow-up with your primary care provider on Monday. Any worsening symptoms over the weekend, please go to the ER.

## 2016-05-22 ENCOUNTER — Ambulatory Visit: Payer: Self-pay | Admitting: Physical Medicine & Rehabilitation

## 2016-05-22 ENCOUNTER — Encounter: Payer: Medicare Other | Attending: Physical Medicine & Rehabilitation

## 2016-05-24 ENCOUNTER — Institutional Professional Consult (permissible substitution): Payer: Self-pay | Admitting: Pulmonary Disease

## 2016-05-25 ENCOUNTER — Encounter: Payer: Self-pay | Admitting: Emergency Medicine

## 2016-05-29 ENCOUNTER — Telehealth: Payer: Self-pay | Admitting: Internal Medicine

## 2016-05-29 ENCOUNTER — Other Ambulatory Visit: Payer: Self-pay

## 2016-05-29 DIAGNOSIS — E1165 Type 2 diabetes mellitus with hyperglycemia: Principal | ICD-10-CM

## 2016-05-29 DIAGNOSIS — IMO0001 Reserved for inherently not codable concepts without codable children: Secondary | ICD-10-CM

## 2016-05-29 MED ORDER — METFORMIN HCL 1000 MG PO TABS: 1000.0000 mg | ORAL_TABLET | Freq: Two times a day (BID) | ORAL | 1 refills | Status: DC

## 2016-05-29 NOTE — Telephone Encounter (Signed)
Submitted

## 2016-05-29 NOTE — Telephone Encounter (Signed)
Metformin needs to be refilled please to [facility]

## 2016-06-12 ENCOUNTER — Ambulatory Visit: Payer: Self-pay | Admitting: Internal Medicine

## 2016-06-13 ENCOUNTER — Ambulatory Visit (INDEPENDENT_AMBULATORY_CARE_PROVIDER_SITE_OTHER): Payer: Medicare Other | Admitting: Internal Medicine

## 2016-06-13 ENCOUNTER — Encounter: Payer: Self-pay | Admitting: Internal Medicine

## 2016-06-13 VITALS — BP 110/62 | HR 86 | Temp 97.6°F | Wt 171.6 lb

## 2016-06-13 DIAGNOSIS — E1159 Type 2 diabetes mellitus with other circulatory complications: Secondary | ICD-10-CM | POA: Diagnosis not present

## 2016-06-13 DIAGNOSIS — IMO0001 Reserved for inherently not codable concepts without codable children: Secondary | ICD-10-CM

## 2016-06-13 DIAGNOSIS — E1165 Type 2 diabetes mellitus with hyperglycemia: Secondary | ICD-10-CM

## 2016-06-13 LAB — POCT GLYCOSYLATED HEMOGLOBIN (HGB A1C): Hemoglobin A1C: 8.2

## 2016-06-13 MED ORDER — GLIPIZIDE 10 MG PO TABS: ORAL_TABLET | ORAL | 1 refills | Status: DC

## 2016-06-13 MED ORDER — GLIPIZIDE 10 MG PO TABS: ORAL_TABLET | ORAL | 3 refills | Status: DC

## 2016-06-13 NOTE — Patient Instructions (Addendum)
Please continue; - Metformin 1000 mg 2x a day.  Increase: - Glipizide to 10 mg before the 1st meal of the day and 10 mg before dinner  Look at: [email or web address] [email or web address].[email or web address]  Read the following books: Dr. Bertrum Sol - Program for Reversing Diabetes Dr. Karl Luke - Prevent and Reverse Heart Disease  Please return in 3 months with your sugar log.
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
## 2016-06-26 ENCOUNTER — Encounter (HOSPITAL_COMMUNITY): Payer: Self-pay | Admitting: Psychiatry

## 2016-06-26 ENCOUNTER — Ambulatory Visit (INDEPENDENT_AMBULATORY_CARE_PROVIDER_SITE_OTHER): Payer: Medicare Other | Admitting: Psychiatry

## 2016-06-26 DIAGNOSIS — Z811 Family history of alcohol abuse and dependence: Secondary | ICD-10-CM

## 2016-06-26 DIAGNOSIS — F419 Anxiety disorder, unspecified: Secondary | ICD-10-CM

## 2016-06-26 DIAGNOSIS — F17210 Nicotine dependence, cigarettes, uncomplicated: Secondary | ICD-10-CM

## 2016-06-26 DIAGNOSIS — F321 Major depressive disorder, single episode, moderate: Secondary | ICD-10-CM

## 2016-06-26 DIAGNOSIS — Z79899 Other long term (current) drug therapy: Secondary | ICD-10-CM | POA: Diagnosis not present

## 2016-06-26 MED ORDER — VENLAFAXINE HCL ER 150 MG PO CP24: ORAL_CAPSULE | ORAL | 0 refills | Status: DC

## 2016-06-26 MED ORDER — HYDROXYZINE PAMOATE 50 MG PO CAPS: ORAL_CAPSULE | ORAL | 0 refills | Status: DC
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
## 2016-07-06 ENCOUNTER — Institutional Professional Consult (permissible substitution): Payer: Self-pay | Admitting: Pulmonary Disease

## 2016-07-27 ENCOUNTER — Other Ambulatory Visit (HOSPITAL_COMMUNITY): Payer: Self-pay | Admitting: Adult Health

## 2016-08-01 ENCOUNTER — Telehealth (HOSPITAL_COMMUNITY): Payer: Self-pay | Admitting: Pharmacist

## 2016-08-01 NOTE — Telephone Encounter (Signed)
Re-enrolled in [employer] foundation so that Ms. Cervantes will have $800 to use toward her Delene Loll copay costs through 07/11/17. Relayed info to [employer] who verified $0 copay.    ID: [id number] BIN: [id number] PCN: [id number] GRP: [id number]   Monique Gift K. Velva Harman, PharmD, BCPS, CPP Clinical Pharmacist Pager: [phone] Phone: [phone] 08/01/2016 10:43 AM

## 2016-08-17 ENCOUNTER — Other Ambulatory Visit (HOSPITAL_COMMUNITY): Payer: Self-pay | Admitting: Adult Health

## 2016-09-04 ENCOUNTER — Encounter (HOSPITAL_COMMUNITY): Payer: Self-pay

## 2016-09-04 ENCOUNTER — Ambulatory Visit (HOSPITAL_COMMUNITY)
Admission: RE | Admit: 2016-09-04 | Discharge: 2016-09-04 | Disposition: A | Discharge status: Home / Self Care | Payer: Medicare Other | Source: Ambulatory Visit | Attending: Internal Medicine | Admitting: Internal Medicine

## 2016-09-04 VITALS — BP 100/64 | HR 104 | Wt 169.2 lb

## 2016-09-04 DIAGNOSIS — I5022 Chronic systolic (congestive) heart failure: Secondary | ICD-10-CM | POA: Insufficient documentation

## 2016-09-04 DIAGNOSIS — F32A Depression, unspecified: Secondary | ICD-10-CM

## 2016-09-04 DIAGNOSIS — Z853 Personal history of malignant neoplasm of breast: Secondary | ICD-10-CM

## 2016-09-04 DIAGNOSIS — I110 Hypertensive heart disease with heart failure: Secondary | ICD-10-CM | POA: Insufficient documentation

## 2016-09-04 DIAGNOSIS — R002 Palpitations: Secondary | ICD-10-CM | POA: Diagnosis not present

## 2016-09-04 DIAGNOSIS — I493 Ventricular premature depolarization: Secondary | ICD-10-CM | POA: Diagnosis not present

## 2016-09-04 DIAGNOSIS — I5032 Chronic diastolic (congestive) heart failure: Secondary | ICD-10-CM | POA: Diagnosis not present

## 2016-09-04 DIAGNOSIS — Z9221 Personal history of antineoplastic chemotherapy: Secondary | ICD-10-CM | POA: Diagnosis not present

## 2016-09-04 DIAGNOSIS — I429 Cardiomyopathy, unspecified: Secondary | ICD-10-CM | POA: Insufficient documentation

## 2016-09-04 DIAGNOSIS — I2510 Atherosclerotic heart disease of native coronary artery without angina pectoris: Secondary | ICD-10-CM | POA: Diagnosis not present

## 2016-09-04 DIAGNOSIS — J418 Mixed simple and mucopurulent chronic bronchitis: Secondary | ICD-10-CM

## 2016-09-04 DIAGNOSIS — Z7982 Long term (current) use of aspirin: Secondary | ICD-10-CM | POA: Diagnosis not present

## 2016-09-04 DIAGNOSIS — F17210 Nicotine dependence, cigarettes, uncomplicated: Secondary | ICD-10-CM | POA: Insufficient documentation

## 2016-09-04 DIAGNOSIS — Z923 Personal history of irradiation: Secondary | ICD-10-CM | POA: Insufficient documentation

## 2016-09-04 DIAGNOSIS — F1411 Cocaine abuse, in remission: Secondary | ICD-10-CM | POA: Insufficient documentation

## 2016-09-04 DIAGNOSIS — I6529 Occlusion and stenosis of unspecified carotid artery: Secondary | ICD-10-CM

## 2016-09-04 DIAGNOSIS — I2584 Coronary atherosclerosis due to calcified coronary lesion: Secondary | ICD-10-CM | POA: Diagnosis not present

## 2016-09-04 DIAGNOSIS — Z7902 Long term (current) use of antithrombotics/antiplatelets: Secondary | ICD-10-CM | POA: Diagnosis not present

## 2016-09-04 DIAGNOSIS — J449 Chronic obstructive pulmonary disease, unspecified: Secondary | ICD-10-CM | POA: Diagnosis not present

## 2016-09-04 DIAGNOSIS — Z7984 Long term (current) use of oral hypoglycemic drugs: Secondary | ICD-10-CM | POA: Diagnosis not present

## 2016-09-04 DIAGNOSIS — E785 Hyperlipidemia, unspecified: Secondary | ICD-10-CM | POA: Diagnosis not present

## 2016-09-04 DIAGNOSIS — E119 Type 2 diabetes mellitus without complications: Secondary | ICD-10-CM | POA: Insufficient documentation

## 2016-09-04 DIAGNOSIS — F329 Major depressive disorder, single episode, unspecified: Secondary | ICD-10-CM | POA: Diagnosis not present

## 2016-09-04 LAB — BASIC METABOLIC PANEL
Anion gap: 12 (ref 5–15)
BUN: 38 mg/dL — AB (ref 6–20)
CALCIUM: 9.1 mg/dL (ref 8.9–10.3)
CHLORIDE: 98 mmol/L — AB (ref 101–111)
CO2: 23 mmol/L (ref 22–32)
CREATININE: 1.91 mg/dL — AB (ref 0.44–1.00)
GFR calc non Af Amer: 28 mL/min — ABNORMAL LOW (ref >60)
GFR, EST AFRICAN AMERICAN: 32 mL/min — AB (ref >60)
GLUCOSE: 197 mg/dL — AB (ref 65–99)
Potassium: 5.3 mmol/L — ABNORMAL HIGH (ref 3.5–5.1)
Sodium: 133 mmol/L — ABNORMAL LOW (ref 135–145)

## 2016-09-04 LAB — BRAIN NATRIURETIC PEPTIDE: B Natriuretic Peptide: 12.2 pg/mL (ref 0.0–100.0)

## 2016-09-04 MED ORDER — FUROSEMIDE 40 MG PO TABS: 40.0000 mg | ORAL_TABLET | Freq: Every day | ORAL | 3 refills | Status: DC

## 2016-09-04 NOTE — Progress Notes (Signed)
Patient ID: Emily [name], female   DOB: Aug 22, 1960, 56 y.o.   MRN: [id number]  ADVANCED HF CLINIC NOTE  Patient ID: Emily [name], female   DOB: January 01, 1961, 56 y.o.   MRN: [id number] PCP: Dr Kathlen Mody Primary Cardiologist: Dr. Haroldine Laws  Emily [name] is a 56 yo woman with a history of ER + R breast cancer s/p R lumpectomy, DM2, cholelithiasis s/p cholecystectomy (02/2013), COPD and systolic CHF due to nonischemic cardiomyopathy.  Patient was admitted in 09/2012 with acute pulmonary edema and cardiogenic shock.  LHC showed moderate nonobstructive CAD (40-50% mLAD, 50-70% LCx, 50% PDA).  She was started on milrinone and diuresed.  Cause of cardiomyopathy suspected to be Adriamycin toxicity.    Echo 10/27/12 EF 20-25% Echo 01/09/13 EF 30-35% Echo 06/05/13 EF 45-50% septum dysynnergic Echo 5/17: EF 15-20% Echo 12/01/15 LVEF 55-60%  12/05/12 Carotid Dopplers- R 39 % stenosis and L  40-59% stenosis  Had surgery 4/17 in [place] to remove R chest leiomyosarcoma. Margins not clear so needs larger resection. Saw Dr. Alen Blew who has ordered scans and has presented to Tumor Board. Pending appt with Dr. Barry Dienes 10/22/15.   She moved back from [place], [place] in May 2017 for recurrent HF symptome. Found to have EF 10-15% with NYHA IIIB-IV symptoms. Was admitted. UDS + cocaine. Cath showed non-obstructive CAD with Cardiogenic shock initial PA sat 34%, 42%. Started on milrinone. Diuresed about 5 pounds. Milrinone weaned off and co-ox dropped down to 52% but was feeling ok. Weight on d/c was 156.   Pt presents today for regular follow up. Was stable at last visit, no medication changes made. Breathing has been alright. No further falls. SOB walking up steps.  Mostly limited by chronic back pain. Has had some lightheadedness associated with an increase in her effexor, she is also having arm tremors, which her psychiatrist told her to look out for and call immediately. She has been having these side effects for 3-4 weeks. Weight  at home [phone]. Takes extra lasix once or twice a week. Denies orthopnea. Taking all medications as directed.   Cath 07/14/15 Ao = 106/72 (87) LV = 108/28 (36) RA = 22 RV = 60/14/25 PA = 67/22 (51) PCW = 31 Fick cardiac output/index = 2.8/1.6 SVR = 1823 PVR = 7.0 WU FA sat = 90% PA sat = 34%, 42%  Assessment: 1. Mild non-obstructive CAD 2. Severe NICM with EF 25% by echo 3. Cardiogenic shock physiology with markedly elevated biventricular filling pressures Lexiscan 09/23/13: EF 50% normal perfusion Holter: SR occasional PVCs  PMH: 1. COPD: PFTs (1/14) with FEV1 68%.  Quit smoking 9/14.  2. Type II diabetes  3. HTN 4. Breast cancer: 2009, s/p Adriamycin/Cytoxan/Taxol chemo, lumpectomy and radiation.   5. Nonischemic cardiomyopathy: Possibly due to Adriamycin.  Echo (8/14) with EF 20-25%, global hypokinesis, moderate diastolic dysfunction, normal RV size and with mild to moderately decreased systolic function, moderate TR.   6. CAD: LHC (8/14) with nonobstructive CAD; 40-50% mLAD, 50-70% LCx, 50% PDA.  7. Hyperlipidemia  SH: Lives alone in [place].  Mother is next door.  Still smoking.   FH: No premature CAD.  No family history of cardiomyopathy.   Review of systems complete and found to be negative unless listed in HPI.    Current Outpatient Prescriptions  Medication Sig Dispense Refill  . aspirin EC 81 MG tablet Take 1 tablet (81 mg total) by mouth daily. 90 tablet 3  . atorvastatin (LIPITOR) 20 MG tablet Take 1 tablet (  20 mg total) by mouth daily. 90 tablet 2  . carvedilol (COREG) 6.25 MG tablet Take 1 tablet (6.25 mg total) by mouth 2 (two) times daily. 180 tablet 3  . furosemide (LASIX) 40 MG tablet Take 1 tablet (40 mg total) by mouth daily. 90 tablet 2  . sacubitril-valsartan (ENTRESTO) 97-103 MG Take 1 tablet by mouth 2 (two) times daily. 180 tablet 3  . spironolactone (ALDACTONE) 25 MG tablet Take 1 tablet (25 mg total) by mouth daily. 90 tablet 2  . albuterol  (PROVENTIL HFA;VENTOLIN HFA) 108 (90 BASE) MCG/ACT inhaler Inhale 2 puffs into the lungs every 6 (six) hours as needed for wheezing or shortness of breath. 1 Inhaler 0  . beclomethasone (QVAR) 40 MCG/ACT inhaler Inhale 2 puffs into the lungs 2 (two) times daily. 1 Inhaler 12  . fluoruracil (CARAC) 0.5 % cream Apply 1 application topically at bedtime.    . gabapentin (NEURONTIN) 400 MG capsule Take 2 capsules (800 mg total) by mouth 3 (three) times daily. 180 capsule 2  . glipiZIDE (GLUCOTROL) 10 MG tablet Take 10 mg in am and 10 mg before dinner 180 tablet 3  . hydrOXYzine (VISTARIL) 50 MG capsule TAKE ONE -TWO CAPSULE BY MOUTH AT BEDTIME AS NEEDED 135 capsule 0  . letrozole (FEMARA) 2.5 MG tablet Take 1 tablet (2.5 mg total) by mouth every morning. 90 tablet 2  . metFORMIN (GLUCOPHAGE) 1000 MG tablet Take 1 tablet (1,000 mg total) by mouth 2 (two) times daily with a meal. 180 tablet 1  . senna (SENOKOT) 8.6 MG tablet Take 1 tablet by mouth daily as needed for constipation.     Marland Kitchen venlafaxine XR (EFFEXOR-XR) 150 MG 24 hr capsule TAKE TWO CAPSULE DAILY 180 capsule 0   No current facility-administered medications for this encounter.     Vitals:   09/04/16 1329  BP: 100/64  Pulse: (!) 104  SpO2: 94%  Weight: 169 lb 3.2 oz (76.7 kg)   Wt Readings from Last 3 Encounters:  09/04/16 169 lb 3.2 oz (76.7 kg)  06/13/16 171 lb 9.6 oz (77.8 kg)  05/19/16 170 lb (77.1 kg)    General: Well appearing. No resp difficulty. HEENT: Normal Neck: Supple. JVP 6-7 cm, Carotids 2+ bilat; no bruits. No thyromegaly or nodule noted.R subclavian scar Cor: PMI nondisplaced. RRR, No M/G/R noted Lungs: CTAB, normal effort. Abdomen: Soft, non-tender, non-distended, no HSM. No bruits or masses. +BS  Extremities: No cyanosis, clubbing, rash, R and LLE no edema.  Neuro: Alert & orientedx3, cranial nerves grossly intact. moves all 4 extremities w/o difficulty. Affect pleasant   Assessment/Plan:  1. Chronic  systolic CHF: NICM, likely Adriamycin toxicity,. EF had previously recovered but now way back down  20%.  - Admitted for cardiogenic shock 5/17. Required milrinone. Now off.  - Echo 12/01/15 with EF LVEF 55-60%   - NYHA III symptoms.  - Volume status stable on exam.  -Continue Entresto 97/103. BMET today.  -Continue spiro 25 mg daily -Continue lasix 40 daily. Can take extra 40 mg as needed for weight gain. (Will make sure her lasix refill accomodates) - Continue carvedilol 6.25 BID - Reinforced fluid restriction to < 2 L daily, sodium restriction to less than 2000 mg daily, and the importance of daily weights.   - 2. CAD: Moderate nonobstructive disease. -Continue ASA and statin - No chest pain.  3. COPD/tobacco use - Continues to smoke 1 PPD. Repeatedly encouraged complete cessation.  4. Cocaine use - Abstinent 5. Breast Cancer: -  Previously received adriamycin. Cancer free for > 5 years. No change. 6. Carotid stenosis:  - Asymptomatic. Follow yearly U/S. Continue asa and statin.  7. R chest leiomyosarcoma: - Had further resections and margins now clear. Sees Dr. Alen Blew and Dr. Barry Dienes. No change.  8. Palpitations:  - 30-day event monitor previously reviewed and without events.  - No further, but has felt more lightheaded with increase in effexor. Discussed as below.  9. Depression - Experiencing side effects from her effexor that her psychiatrist told her to look out for in order to decrease dose as needed. - Informed her to call ASAP.   Volume status stable on exam. Labs today. Needs to cal Psych ASAP. RTC 3 months with Echo.   Shirley Friar, PA-C  1:39 PM

## 2016-09-04 NOTE — Patient Instructions (Addendum)
Routine lab work today. Will notify you of abnormal results, otherwise no news is good news!  No changes to medication at this time.  Refill sent in for Lasix with extra tablets to allow for as needed tablets.  Contact psychiatrist for side effects secondary to Effexor.  Follow up 3 months with echocardiogram and appointment with Dr. Haroldine Laws.  Take all medication as prescribed the day of your appointment. Bring all medications with you to your appointment.  Do the following things EVERYDAY: 1) Weigh yourself in the morning before breakfast. Write it down and keep it in a log. 2) Take your medicines as prescribed 3) Eat low salt foods-Limit salt (sodium) to 2000 mg per day.  4) Stay as active as you can everyday 5) Limit all fluids for the day to less than 2 liters

## 2016-09-05 ENCOUNTER — Telehealth (HOSPITAL_COMMUNITY): Payer: Self-pay

## 2016-09-05 NOTE — Telephone Encounter (Signed)
Faylene Million Peptide  Order: [id number]  Status:  Final result Visible to patient:  Yes (MyChart) Dx:  Chronic heart failure with normal eje...  Notes recorded by Effie Berkshire, RN on 09/05/2016 at 11:45 AM EDT Attempted to call, no answer, no VM set up to leave message. ------  Notes recorded by Shirley Friar, PA-C on 09/04/2016 at 3:02 PM EDT  Hold lasix x 3 days.  Make sure she is not taking any potassium. Avoid high potassium foods. Recheck BMET next week.   Decrease spiro to 12.5 mg daily.   Legrand Como [address] [place], PA-C 09/04/2016 3:00 PM

## 2016-09-05 NOTE — Telephone Encounter (Signed)
Patient calling because she states the Effexor is causing tremors. Please review and advise, thank you

## 2016-09-05 NOTE — Telephone Encounter (Signed)
She can reduce Effexor back to 225 mg and if tremors do not resolve them call us back.

## 2016-09-07 NOTE — Telephone Encounter (Signed)
I tried to call patient back and a woman answered and told me that this was her sons phone - I verified the number I dialed and she said that yes, that was her sons number. 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

## 2016-09-08 ENCOUNTER — Encounter (HOSPITAL_COMMUNITY): Payer: Self-pay

## 2016-09-08 ENCOUNTER — Other Ambulatory Visit: Payer: Self-pay | Admitting: Oncology

## 2016-09-08 DIAGNOSIS — Z853 Personal history of malignant neoplasm of breast: Secondary | ICD-10-CM

## 2016-09-08 DIAGNOSIS — Z1231 Encounter for screening mammogram for malignant neoplasm of breast: Secondary | ICD-10-CM

## 2016-09-08 NOTE — Progress Notes (Signed)
Faylene Million Peptide  Order: [id number]  Status:  Final result Visible to patient:  Yes (MyChart) Dx:  Chronic heart failure with normal eje...  Notes recorded by Effie Berkshire, RN on 09/08/2016 at 1:07 PM EDT 3rd attempt to reach patient unsuccessful, letter mailed to contact our office to review results. ------  Notes recorded by Kerry Dory, CMA on 09/07/2016 at 9:34 AM EDT Unable to reach patient. Unable to leave message no VM [phone] (H) ------  Notes recorded by Effie Berkshire, RN on 09/05/2016 at 11:45 AM EDT Attempted to call, no answer, no VM set up to leave message. ------  Notes recorded by Shirley Friar, PA-C on 09/04/2016 at 3:02 PM EDT  Hold lasix x 3 days.  Make sure she is not taking any potassium. Avoid high potassium foods. Recheck BMET next week.   Decrease spiro to 12.5 mg daily.   Legrand Como [address]" [place], PA-C 09/04/2016 3:00 PM

## 2016-09-13 ENCOUNTER — Encounter: Payer: Self-pay | Admitting: *Deleted

## 2016-09-19 ENCOUNTER — Telehealth: Payer: Self-pay | Admitting: Oncology

## 2016-09-19 NOTE — Telephone Encounter (Signed)
Patient call to reschedule her appointment and the first thing you had was 8/16 at 3pm  I put her there.  Let me know if we need to move her sooner

## 2016-09-19 NOTE — Telephone Encounter (Signed)
That's fine

## 2016-09-20 ENCOUNTER — Ambulatory Visit: Payer: Self-pay | Admitting: Oncology

## 2016-09-22 ENCOUNTER — Ambulatory Visit (HOSPITAL_COMMUNITY): Payer: Medicare Other | Admitting: Psychiatry

## 2016-09-25 ENCOUNTER — Telehealth (HOSPITAL_COMMUNITY): Payer: Self-pay

## 2016-09-25 ENCOUNTER — Ambulatory Visit (HOSPITAL_COMMUNITY): Payer: Self-pay | Admitting: Psychiatry

## 2016-09-25 DIAGNOSIS — I5022 Chronic systolic (congestive) heart failure: Secondary | ICD-10-CM

## 2016-09-25 MED ORDER — SPIRONOLACTONE 25 MG PO TABS: 12.5000 mg | ORAL_TABLET | Freq: Every day | ORAL | 3 refills | Status: DC

## 2016-09-25 NOTE — Telephone Encounter (Signed)
Patient called CHF clinic after receiving letter from our office stating multiple failed attempts to reach her concerning abnormal results.  Advised patient per andy as follows:  Hold lasix x 3 days.  Make sure she is not taking any potassium. Avoid high potassium foods. Recheck BMET next week.  Decrease spiro to 12.5 mg daily.   Legrand Como [address] [place], PA-C 09/04/2016 3:00 PM  Patient aware and agreeable.  Lab scheduled for next week to reassess.  Renee Pain, RN

## 2016-09-26 ENCOUNTER — Encounter: Payer: Self-pay | Admitting: Internal Medicine

## 2016-09-26 ENCOUNTER — Ambulatory Visit (INDEPENDENT_AMBULATORY_CARE_PROVIDER_SITE_OTHER): Payer: Medicare Other | Admitting: Internal Medicine

## 2016-09-26 VITALS — BP 118/72 | HR 108 | Ht 65.0 in | Wt 170.0 lb

## 2016-09-26 DIAGNOSIS — E1165 Type 2 diabetes mellitus with hyperglycemia: Secondary | ICD-10-CM

## 2016-09-26 DIAGNOSIS — E1159 Type 2 diabetes mellitus with other circulatory complications: Secondary | ICD-10-CM | POA: Diagnosis not present

## 2016-09-26 DIAGNOSIS — M792 Neuralgia and neuritis, unspecified: Secondary | ICD-10-CM | POA: Diagnosis not present

## 2016-09-26 LAB — POCT GLYCOSYLATED HEMOGLOBIN (HGB A1C): Hemoglobin A1C: 8.6

## 2016-09-26 MED ORDER — INSULIN GLARGINE 100 UNIT/ML ~~LOC~~ SOLN: 18.0000 [IU] | Freq: Every day | SUBCUTANEOUS | 11 refills | Status: DC
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
## 2016-09-26 NOTE — Patient Instructions (Addendum)
Please continue; - Metformin 1000 mg 2x a day. - Glipizide 10 mg before the 1st meal of the day and 10 mg before dinner  Please start: - Lantus 18 units at bedtime. If the sugars in am are not lower than 150, please increase the dose by 4 units every 4 days, up to a max of 30 units  Please return in 3 months with your sugar log.

## 2016-09-26 NOTE — Addendum Note (Signed)
Addended by: Caprice Beaver T on: 09/26/2016 03:55 PM   Modules accepted: Orders

## 2016-10-03 ENCOUNTER — Inpatient Hospital Stay (HOSPITAL_COMMUNITY): Admission: RE | Admit: 2016-10-03 | Payer: Self-pay | Source: Ambulatory Visit

## 2016-10-03 ENCOUNTER — Other Ambulatory Visit (HOSPITAL_COMMUNITY): Payer: Self-pay

## 2016-10-03 DIAGNOSIS — F321 Major depressive disorder, single episode, moderate: Secondary | ICD-10-CM

## 2016-10-03 MED ORDER — VENLAFAXINE HCL ER 150 MG PO CP24: ORAL_CAPSULE | ORAL | 0 refills | Status: DC

## 2016-10-03 MED ORDER — HYDROXYZINE PAMOATE 50 MG PO CAPS: ORAL_CAPSULE | ORAL | 0 refills | Status: DC

## 2016-10-03 MED ORDER — VENLAFAXINE HCL ER 75 MG PO CP24: 75.0000 mg | ORAL_CAPSULE | Freq: Every day | ORAL | 0 refills | Status: DC

## 2016-10-05 ENCOUNTER — Inpatient Hospital Stay (HOSPITAL_COMMUNITY): Admission: RE | Admit: 2016-10-05 | Payer: Self-pay | Source: Ambulatory Visit

## 2016-10-05 ENCOUNTER — Ambulatory Visit (HOSPITAL_COMMUNITY): Payer: Medicare Other | Admitting: Psychiatry

## 2016-10-06 ENCOUNTER — Other Ambulatory Visit: Payer: Medicare Other

## 2016-10-06 ENCOUNTER — Other Ambulatory Visit (HOSPITAL_COMMUNITY): Payer: Self-pay | Admitting: *Deleted

## 2016-10-06 ENCOUNTER — Telehealth (HOSPITAL_COMMUNITY): Payer: Self-pay | Admitting: *Deleted

## 2016-10-06 DIAGNOSIS — I5042 Chronic combined systolic (congestive) and diastolic (congestive) heart failure: Secondary | ICD-10-CM | POA: Diagnosis not present

## 2016-10-06 NOTE — Telephone Encounter (Signed)
Emily [name]  Order: [id number]  Status:  Final result Visible to patient:  Yes (MyChart) Dx:  Chronic heart failure with normal eje...  Notes recorded by Darron Doom, RN on 10/06/2016 at 9:36 AM EDT Patient is having BMET drawn today, office called and asked for me to place new order. Order placed. ------  Notes recorded by Kerry Dory, CMA on 10/04/2016 at 12:10 PM EDT Unable to reach patient. Number not in service  [phone] [facility]) ------  Notes recorded by Shirley Friar, PA-C on 10/04/2016 at 7:24 AM EDT Needs repeat BMET, can we try and get in touch with her again? ------  Notes recorded by Effie Berkshire, RN on 09/08/2016 at 1:07 PM EDT 3rd attempt to reach patient unsuccessful, letter mailed to contact our office to review results. ------  Notes recorded by Kerry Dory, CMA on 09/07/2016 at 9:34 AM EDT Unable to reach patient. Unable to leave message no VM [phone] (H) ------  Notes recorded by Effie Berkshire, RN on 09/05/2016 at 11:45 AM EDT Attempted to call, no answer, no VM set up to leave message. ------  Notes recorded by Shirley Friar, PA-C on 09/04/2016 at 3:02 PM EDT  Hold lasix x 3 days.  Make sure she is not taking any potassium. Avoid high potassium foods. Recheck BMET next week.   Decrease spiro to 12.5 mg daily.   Legrand Como [address]" [place], PA-C 09/04/2016 3:00 PM

## 2016-10-07 LAB — BASIC METABOLIC PANEL
BUN / CREAT RATIO: 16 (ref 9–23)
BUN: 25 mg/dL — ABNORMAL HIGH (ref 6–24)
CO2: 26 mmol/L (ref 20–29)
CREATININE: 1.53 mg/dL — AB (ref 0.57–1.00)
Calcium: 9.1 mg/dL (ref 8.7–10.2)
Chloride: 93 mmol/L — ABNORMAL LOW (ref 96–106)
GFR calc Af Amer: 43 mL/min/{1.73_m2} — ABNORMAL LOW (ref >59)
GFR, EST NON AFRICAN AMERICAN: 37 mL/min/{1.73_m2} — AB (ref >59)
Glucose: 187 mg/dL — ABNORMAL HIGH (ref 65–99)
POTASSIUM: 5.1 mmol/L (ref 3.5–5.2)
SODIUM: 136 mmol/L (ref 134–144)

## 2016-10-09 ENCOUNTER — Ambulatory Visit
Admission: RE | Admit: 2016-10-09 | Discharge: 2016-10-09 | Disposition: A | Discharge status: Home / Self Care | Payer: Medicare Other | Source: Ambulatory Visit | Attending: Oncology | Admitting: Oncology

## 2016-10-09 ENCOUNTER — Other Ambulatory Visit (HOSPITAL_COMMUNITY): Payer: Self-pay | Admitting: Internal Medicine

## 2016-10-09 ENCOUNTER — Ambulatory Visit: Payer: Self-pay

## 2016-10-09 DIAGNOSIS — Z1231 Encounter for screening mammogram for malignant neoplasm of breast: Secondary | ICD-10-CM | POA: Diagnosis not present

## 2016-10-09 DIAGNOSIS — Z853 Personal history of malignant neoplasm of breast: Secondary | ICD-10-CM

## 2016-10-09 DIAGNOSIS — E785 Hyperlipidemia, unspecified: Secondary | ICD-10-CM

## 2016-10-09 HISTORY — DX: Personal history of antineoplastic chemotherapy: Z92.21

## 2016-10-09 HISTORY — DX: Personal history of irradiation: Z92.3

## 2016-10-12 ENCOUNTER — Ambulatory Visit (HOSPITAL_BASED_OUTPATIENT_CLINIC_OR_DEPARTMENT_OTHER): Payer: Medicare Other | Admitting: Oncology

## 2016-10-12 ENCOUNTER — Telehealth: Payer: Self-pay | Admitting: Oncology

## 2016-10-12 VITALS — BP 115/74 | HR 97 | Temp 97.6°F | Resp 18 | Wt 171.2 lb

## 2016-10-12 DIAGNOSIS — C493 Malignant neoplasm of connective and soft tissue of thorax: Secondary | ICD-10-CM

## 2016-10-12 DIAGNOSIS — Z79811 Long term (current) use of aromatase inhibitors: Secondary | ICD-10-CM | POA: Diagnosis not present

## 2016-10-12 DIAGNOSIS — C50919 Malignant neoplasm of unspecified site of unspecified female breast: Secondary | ICD-10-CM | POA: Diagnosis not present

## 2016-10-12 DIAGNOSIS — Z853 Personal history of malignant neoplasm of breast: Secondary | ICD-10-CM

## 2016-10-12 DIAGNOSIS — M810 Age-related osteoporosis without current pathological fracture: Secondary | ICD-10-CM

## 2016-10-12 DIAGNOSIS — C499 Malignant neoplasm of connective and soft tissue, unspecified: Secondary | ICD-10-CM

## 2016-10-12 NOTE — Telephone Encounter (Signed)
Scheduled appt per 8/16 los - Gave patient AVS and calender per los. - central radiology to contact patient with ct and DEXA appt.

## 2016-10-12 NOTE — Progress Notes (Signed)
Hematology and Oncology Follow Up Visit  Emily [name] [id number] 1960-07-26 56 y.o. 10/12/2016 2:52 PM Dettinger, Fransisca Kaufmann, MDKuneff, Renee A, DO   Principle Diagnosis: 56 year old woman with the following issues:  1. Leiomyosarcoma of the right chest wall. She is status post excision of 3.5 x 2.0 x 1.7 cm lesion with positive margins in April 2017. 2. Breast cancer diagnosed in 2009. At that time she had a stage IIIc disease with 3.1 cm tumor and 5 out of 7 lymph nodes involved. Her tumor was ER/PR positive HER-2 negative.  3. Nonischemic cardiomyopathy related to Adriamycin exposure.  Prior Therapy:   She was treated with Adriamycin and cyclophosphamide in a dose dense fashion and weekly Taxol. She received radiation after lumpectomy and tamoxifen and subsequently Arimidex and subsequently letrozole.  She is status post wide excision of her right chest wall leiomyosarcoma done on 12/28/2015. The pathology showed no residual malignancy.  Current therapy: Letrozole 2.5 mg daily since 2010.  Interim History: Emily [name] presents today for a follow-up visit. Since the last visit, she reports no major changes in her health. She remains reasonably active and attends to activities of daily living. She denied any recent hospitalization or illnesses. She denied any edema or shortness of breath. She continues to take letrozole without any recent complications. She does not report any pathological fractures or bone pain. She does not report any chest wall masses or lesions.  She does not report any headaches, blurry vision, syncope or seizures. She does not report any fevers or chills or sweats. She does not report any cough, wheezing or hemoptysis. She does not report any nausea, vomiting or abdominal pain. She does not report any frequency urgency or hesitancy. She does not report any skeletal complaints. Remaining review of systems unremarkable.   Medications: I have reviewed the patient's current  medications.  Current Outpatient Prescriptions  Medication Sig Dispense Refill  . albuterol (PROVENTIL HFA;VENTOLIN HFA) 108 (90 BASE) MCG/ACT inhaler Inhale 2 puffs into the lungs every 6 (six) hours as needed for wheezing or shortness of breath. 1 Inhaler 0  . aspirin EC 81 MG tablet Take 1 tablet (81 mg total) by mouth daily. 90 tablet 3  . atorvastatin (LIPITOR) 20 MG tablet TAKE ONE TABLET BY MOUTH ONCE DAILY 90 tablet 2  . beclomethasone (QVAR) 40 MCG/ACT inhaler Inhale 2 puffs into the lungs 2 (two) times daily. 1 Inhaler 12  . carvedilol (COREG) 6.25 MG tablet Take 1 tablet (6.25 mg total) by mouth 2 (two) times daily. 180 tablet 3  . fluoruracil (CARAC) 0.5 % cream Apply 1 application topically at bedtime.    . furosemide (LASIX) 40 MG tablet Take 1 tablet (40 mg total) by mouth daily. Take extra 40 mg tablet once daily AS NEEDED for weight gain 3 lbs or more overnight. 120 tablet 3  . gabapentin (NEURONTIN) 400 MG capsule Take 2 capsules (800 mg total) by mouth 3 (three) times daily. 180 capsule 2  . glipiZIDE (GLUCOTROL) 10 MG tablet Take 10 mg in am and 10 mg before dinner 180 tablet 3  . hydrOXYzine (VISTARIL) 50 MG capsule TAKE ONE -TWO CAPSULE BY MOUTH AT BEDTIME AS NEEDED 45 capsule 0  . insulin glargine (LANTUS) 100 UNIT/ML injection Inject 0.18 mLs (18 Units total) into the skin at bedtime. 10 mL 11  . letrozole (FEMARA) 2.5 MG tablet Take 1 tablet (2.5 mg total) by mouth every morning. 90 tablet 2  . metFORMIN (GLUCOPHAGE) 1000 MG tablet Take  1 tablet (1,000 mg total) by mouth 2 (two) times daily with a meal. 180 tablet 1  . sacubitril-valsartan (ENTRESTO) 97-103 MG Take 1 tablet by mouth 2 (two) times daily. 180 tablet 3  . senna (SENOKOT) 8.6 MG tablet Take 1 tablet by mouth daily as needed for constipation.     . SPIRONOLACTONE PO Take 6.25 mg by mouth daily.    Marland Kitchen venlafaxine XR (EFFEXOR XR) 75 MG 24 hr capsule Take 1 capsule (75 mg total) by mouth daily with breakfast. 30  capsule 0  . venlafaxine XR (EFFEXOR-XR) 150 MG 24 hr capsule TAKE TWO CAPSULE DAILY 30 capsule 0   No current facility-administered medications for this visit.      Allergies:  Allergies  Allergen Reactions  . Contrast Media [Iodinated Diagnostic Agents] Anaphylaxis  . Gadolinium Shortness Of Breath and Other (See Comments)     Code: SOB, Onset Date: 16109604     Past Medical History, Surgical history, Social history, and Family History were reviewed and updated.   Physical Exam: Blood pressure 115/74, pulse 97, temperature 97.6 F (36.4 C), temperature source Oral, resp. rate 18, weight 171 lb 4 oz (77.7 kg), SpO2 96 %.    ECOG: 1 General appearance: Well-appearing woman without distress. Head: Normocephalic, without obvious abnormality no oral ulcers or lesions. Neck: no adenopathy Lymph nodes: Cervical, supraclavicular, and axillary nodes normal. Heart:regular rate and rhythm, S1, S2 normal, no murmur, click, rub or gallop Lung:chest clear, no wheezing, rales, normal symmetric air entry Chest wall examination showed well healed scar. No palpated masses or lesions. Abdomin: soft, non-tender, without masses or organomegaly EXT:no erythema, induration, or nodules   Lab Results: Lab Results  Component Value Date   WBC 11.5 (H) 03/13/2016   HGB 12.9 03/13/2016   HCT 39.3 03/13/2016   MCV 86.9 03/13/2016   PLT 177 03/13/2016     Chemistry      Component Value Date/Time   NA 136 10/06/2016 0838   NA 134 (L) 03/22/2016 1110   K 5.1 10/06/2016 0838   K 5.1 03/22/2016 1110   CL 93 (L) 10/06/2016 0838   CL 102 06/21/2012   CO2 26 10/06/2016 0838   CO2 27 03/22/2016 1110   BUN 25 (H) 10/06/2016 0838   BUN 27.7 (H) 03/22/2016 1110   CREATININE 1.53 (H) 10/06/2016 0838   CREATININE 1.2 (H) 03/22/2016 1110      Component Value Date/Time   CALCIUM 9.1 10/06/2016 0838   CALCIUM 9.6 03/22/2016 1110   ALKPHOS 90 03/22/2016 1110   AST 13 03/22/2016 1110   ALT 15  03/22/2016 1110   BILITOT 0.27 03/22/2016 1110          Impression and Plan:  56 year old woman with the following issues:  1. Leiomyosarcoma of the right chest wall. She is status post excision of 3.5 x 2.0 x 1.7 cm lesion with positive margins. This is in the setting of a history of breast cancer and breast radiation completed in 2009.  CT scan on 09/16/2015 showed no evidence of metastatic disease. Her case was discussed and the sarcoma tumor Board recommended surgical reexcision that was completed in October 2017.  CT scan of the chest obtained on January 2018 showed no evidence of recurrent malignancy. She does have a 6 mm nodule posteriorly left lower lobe which is slightly enlarged to prior examination.  The plan is to continue with active surveillance and repeat imaging studies in December 2018.   2. History of breast cancer:  Diagnosed in 2009 with a stage IIIc. She is status post adjuvant chemotherapy initially with chemotherapy, radiation therapy and currently on adjuvant hormonal therapy since 2010.   I have recommended continuing Letrozole to complete 10 years of therapy. After that we will discussed the risks and benefits associated with this medication.  3. Osteoporosis: She had a DEXA scan in 2016 and we will repeat in the near future to assess for bone density.  4. Follow-up: Will be December 2018.  Zola Button, MD 8/16/20182:52 PM

## 2016-10-18 ENCOUNTER — Ambulatory Visit: Payer: Self-pay | Admitting: Family Medicine

## 2016-10-18 ENCOUNTER — Encounter: Payer: Self-pay | Admitting: Family Medicine

## 2016-10-24 ENCOUNTER — Ambulatory Visit (INDEPENDENT_AMBULATORY_CARE_PROVIDER_SITE_OTHER): Payer: Medicare Other | Admitting: Psychiatry

## 2016-10-24 ENCOUNTER — Encounter (HOSPITAL_COMMUNITY): Payer: Self-pay | Admitting: Psychiatry

## 2016-10-24 VITALS — BP 126/74 | HR 110 | Ht 65.0 in | Wt 170.6 lb

## 2016-10-24 DIAGNOSIS — F321 Major depressive disorder, single episode, moderate: Secondary | ICD-10-CM

## 2016-10-24 DIAGNOSIS — F419 Anxiety disorder, unspecified: Secondary | ICD-10-CM | POA: Diagnosis not present

## 2016-10-24 DIAGNOSIS — Z560 Unemployment, unspecified: Secondary | ICD-10-CM | POA: Diagnosis not present

## 2016-10-24 DIAGNOSIS — F1911 Other psychoactive substance abuse, in remission: Secondary | ICD-10-CM

## 2016-10-24 DIAGNOSIS — Z811 Family history of alcohol abuse and dependence: Secondary | ICD-10-CM

## 2016-10-24 DIAGNOSIS — M2550 Pain in unspecified joint: Secondary | ICD-10-CM

## 2016-10-24 DIAGNOSIS — F17210 Nicotine dependence, cigarettes, uncomplicated: Secondary | ICD-10-CM

## 2016-10-24 DIAGNOSIS — G4700 Insomnia, unspecified: Secondary | ICD-10-CM

## 2016-10-24 DIAGNOSIS — M549 Dorsalgia, unspecified: Secondary | ICD-10-CM

## 2016-10-24 MED ORDER — HYDROXYZINE PAMOATE 50 MG PO CAPS: ORAL_CAPSULE | ORAL | 1 refills | Status: DC

## 2016-10-24 MED ORDER — LAMOTRIGINE 25 MG PO TABS: ORAL_TABLET | ORAL | 1 refills | Status: DC

## 2016-10-24 MED ORDER — VENLAFAXINE HCL ER 150 MG PO CP24: 150.0000 mg | ORAL_CAPSULE | Freq: Every day | ORAL | 1 refills | Status: DC

## 2016-10-24 MED ORDER — LORAZEPAM 0.5 MG PO TABS: 0.5000 mg | ORAL_TABLET | Freq: Every day | ORAL | 1 refills | Status: DC | PRN
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
## 2016-11-01 ENCOUNTER — Other Ambulatory Visit (HOSPITAL_COMMUNITY): Payer: Self-pay | Admitting: Internal Medicine

## 2016-11-02 ENCOUNTER — Telehealth (HOSPITAL_COMMUNITY): Payer: Self-pay | Admitting: Pharmacist

## 2016-11-02 NOTE — Telephone Encounter (Signed)
Received a call from Ms. Blackston stating that her Delene Loll was not going through under PANF. I checked her grant and she still has over $600 left to use through 06/2017. I called [facility] in [place] and asked them to run it again and it went through for no charge to her. Relayed info to Ms. Klindt who was grateful for the assistance.   [name]. Velva Harman, PharmD, BCPS, CPP Clinical Pharmacist Pager: [phone] Phone: [phone] 11/02/2016 2:03 PM

## 2016-11-03 ENCOUNTER — Ambulatory Visit: Payer: Self-pay | Admitting: Physical Medicine & Rehabilitation

## 2016-11-16 ENCOUNTER — Other Ambulatory Visit: Payer: Self-pay | Admitting: Internal Medicine

## 2016-11-16 DIAGNOSIS — IMO0001 Reserved for inherently not codable concepts without codable children: Secondary | ICD-10-CM

## 2016-11-16 DIAGNOSIS — E1165 Type 2 diabetes mellitus with hyperglycemia: Principal | ICD-10-CM

## 2016-12-22 ENCOUNTER — Ambulatory Visit (HOSPITAL_COMMUNITY): Payer: Medicare Other

## 2016-12-22 ENCOUNTER — Encounter (HOSPITAL_COMMUNITY): Payer: Self-pay | Admitting: Internal Medicine

## 2016-12-26 ENCOUNTER — Ambulatory Visit (HOSPITAL_COMMUNITY): Payer: Self-pay | Admitting: Psychiatry

## 2016-12-26 ENCOUNTER — Ambulatory Visit: Payer: Self-pay | Admitting: Internal Medicine

## 2016-12-26 ENCOUNTER — Other Ambulatory Visit (HOSPITAL_COMMUNITY): Payer: Self-pay

## 2016-12-26 DIAGNOSIS — F321 Major depressive disorder, single episode, moderate: Secondary | ICD-10-CM

## 2016-12-26 MED ORDER — HYDROXYZINE PAMOATE 50 MG PO CAPS: ORAL_CAPSULE | ORAL | 0 refills | Status: DC

## 2016-12-26 MED ORDER — VENLAFAXINE HCL ER 150 MG PO CP24: 150.0000 mg | ORAL_CAPSULE | Freq: Every day | ORAL | 0 refills | Status: DC

## 2016-12-26 MED ORDER — LORAZEPAM 0.5 MG PO TABS: 0.5000 mg | ORAL_TABLET | Freq: Every day | ORAL | 0 refills | Status: DC | PRN

## 2016-12-26 MED ORDER — LAMOTRIGINE 25 MG PO TABS: ORAL_TABLET | ORAL | 1 refills | Status: DC

## 2016-12-28 ENCOUNTER — Other Ambulatory Visit: Payer: Self-pay | Admitting: Oncology

## 2017-01-14 ENCOUNTER — Other Ambulatory Visit (HOSPITAL_COMMUNITY): Payer: Self-pay | Admitting: Internal Medicine

## 2017-01-14 ENCOUNTER — Other Ambulatory Visit (HOSPITAL_COMMUNITY): Payer: Self-pay | Admitting: Psychiatry

## 2017-01-14 DIAGNOSIS — F321 Major depressive disorder, single episode, moderate: Secondary | ICD-10-CM

## 2017-01-15 ENCOUNTER — Ambulatory Visit (HOSPITAL_COMMUNITY): Payer: Self-pay | Admitting: Psychiatry

## 2017-01-29 ENCOUNTER — Other Ambulatory Visit (HOSPITAL_COMMUNITY): Payer: Self-pay

## 2017-01-29 DIAGNOSIS — F321 Major depressive disorder, single episode, moderate: Secondary | ICD-10-CM

## 2017-01-29 MED ORDER — HYDROXYZINE PAMOATE 50 MG PO CAPS: ORAL_CAPSULE | ORAL | 0 refills | Status: DC

## 2017-01-29 MED ORDER — LORAZEPAM 0.5 MG PO TABS: 0.5000 mg | ORAL_TABLET | Freq: Every day | ORAL | 0 refills | Status: DC | PRN

## 2017-02-06 ENCOUNTER — Ambulatory Visit (HOSPITAL_COMMUNITY): Payer: Medicare Other

## 2017-02-07 ENCOUNTER — Ambulatory Visit (INDEPENDENT_AMBULATORY_CARE_PROVIDER_SITE_OTHER): Payer: Medicare Other | Admitting: Psychiatry

## 2017-02-07 ENCOUNTER — Encounter (HOSPITAL_COMMUNITY): Payer: Self-pay | Admitting: Psychiatry

## 2017-02-07 DIAGNOSIS — M2550 Pain in unspecified joint: Secondary | ICD-10-CM

## 2017-02-07 DIAGNOSIS — M549 Dorsalgia, unspecified: Secondary | ICD-10-CM | POA: Diagnosis not present

## 2017-02-07 DIAGNOSIS — F17210 Nicotine dependence, cigarettes, uncomplicated: Secondary | ICD-10-CM

## 2017-02-07 DIAGNOSIS — F1910 Other psychoactive substance abuse, uncomplicated: Secondary | ICD-10-CM | POA: Diagnosis not present

## 2017-02-07 DIAGNOSIS — F419 Anxiety disorder, unspecified: Secondary | ICD-10-CM | POA: Diagnosis not present

## 2017-02-07 DIAGNOSIS — F321 Major depressive disorder, single episode, moderate: Secondary | ICD-10-CM

## 2017-02-07 DIAGNOSIS — Z811 Family history of alcohol abuse and dependence: Secondary | ICD-10-CM

## 2017-02-07 DIAGNOSIS — Z560 Unemployment, unspecified: Secondary | ICD-10-CM

## 2017-02-07 DIAGNOSIS — R05 Cough: Secondary | ICD-10-CM

## 2017-02-07 MED ORDER — HYDROXYZINE PAMOATE 100 MG PO CAPS: 100.0000 mg | ORAL_CAPSULE | Freq: Every evening | ORAL | 1 refills | Status: DC | PRN

## 2017-02-07 MED ORDER — LAMOTRIGINE 100 MG PO TABS: 100.0000 mg | ORAL_TABLET | Freq: Every day | ORAL | 1 refills | Status: DC

## 2017-02-07 MED ORDER — VENLAFAXINE HCL ER 150 MG PO CP24: 150.0000 mg | ORAL_CAPSULE | Freq: Every day | ORAL | 1 refills | Status: DC
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
## 2017-02-09 ENCOUNTER — Other Ambulatory Visit: Payer: Self-pay

## 2017-02-13 ENCOUNTER — Ambulatory Visit: Payer: Self-pay | Admitting: Oncology

## 2017-02-15 DIAGNOSIS — R05 Cough: Secondary | ICD-10-CM | POA: Diagnosis not present

## 2017-02-15 DIAGNOSIS — J209 Acute bronchitis, unspecified: Secondary | ICD-10-CM | POA: Diagnosis not present

## 2017-02-23 ENCOUNTER — Ambulatory Visit: Payer: Self-pay | Admitting: Internal Medicine

## 2017-03-01 ENCOUNTER — Ambulatory Visit (HOSPITAL_COMMUNITY)
Admission: RE | Admit: 2017-03-01 | Discharge: 2017-03-01 | Disposition: A | Discharge status: Home / Self Care | Payer: Medicare Other | Source: Ambulatory Visit | Attending: Oncology | Admitting: Oncology

## 2017-03-01 DIAGNOSIS — I700 Atherosclerosis of aorta: Secondary | ICD-10-CM | POA: Diagnosis not present

## 2017-03-01 DIAGNOSIS — J439 Emphysema, unspecified: Secondary | ICD-10-CM | POA: Insufficient documentation

## 2017-03-01 DIAGNOSIS — M810 Age-related osteoporosis without current pathological fracture: Secondary | ICD-10-CM | POA: Diagnosis not present

## 2017-03-01 DIAGNOSIS — R911 Solitary pulmonary nodule: Secondary | ICD-10-CM | POA: Diagnosis not present

## 2017-03-01 DIAGNOSIS — C499 Malignant neoplasm of connective and soft tissue, unspecified: Secondary | ICD-10-CM | POA: Insufficient documentation

## 2017-03-01 DIAGNOSIS — Z853 Personal history of malignant neoplasm of breast: Secondary | ICD-10-CM | POA: Diagnosis not present

## 2017-03-02 ENCOUNTER — Inpatient Hospital Stay: Admission: RE | Admit: 2017-03-02 | Payer: Self-pay | Source: Ambulatory Visit

## 2017-03-09 ENCOUNTER — Ambulatory Visit: Payer: Self-pay | Admitting: Oncology

## 2017-03-14 ENCOUNTER — Ambulatory Visit (HOSPITAL_COMMUNITY)
Admission: RE | Admit: 2017-03-14 | Discharge: 2017-03-14 | Disposition: A | Discharge status: Home / Self Care | Payer: Medicare Other | Source: Ambulatory Visit | Attending: Internal Medicine | Admitting: Internal Medicine

## 2017-03-14 ENCOUNTER — Encounter (HOSPITAL_COMMUNITY): Payer: Self-pay | Admitting: Internal Medicine

## 2017-03-14 ENCOUNTER — Ambulatory Visit (HOSPITAL_BASED_OUTPATIENT_CLINIC_OR_DEPARTMENT_OTHER)
Admission: RE | Admit: 2017-03-14 | Discharge: 2017-03-14 | Disposition: A | Discharge status: Home / Self Care | Payer: Medicare Other | Source: Ambulatory Visit | Attending: Student | Admitting: Student

## 2017-03-14 VITALS — BP 110/75 | HR 95 | Wt 162.0 lb

## 2017-03-14 DIAGNOSIS — I5032 Chronic diastolic (congestive) heart failure: Secondary | ICD-10-CM

## 2017-03-14 DIAGNOSIS — Z79811 Long term (current) use of aromatase inhibitors: Secondary | ICD-10-CM | POA: Insufficient documentation

## 2017-03-14 DIAGNOSIS — Z7984 Long term (current) use of oral hypoglycemic drugs: Secondary | ICD-10-CM | POA: Diagnosis not present

## 2017-03-14 DIAGNOSIS — C761 Malignant neoplasm of thorax: Secondary | ICD-10-CM | POA: Insufficient documentation

## 2017-03-14 DIAGNOSIS — J449 Chronic obstructive pulmonary disease, unspecified: Secondary | ICD-10-CM | POA: Insufficient documentation

## 2017-03-14 DIAGNOSIS — Z923 Personal history of irradiation: Secondary | ICD-10-CM | POA: Diagnosis not present

## 2017-03-14 DIAGNOSIS — I6523 Occlusion and stenosis of bilateral carotid arteries: Secondary | ICD-10-CM

## 2017-03-14 DIAGNOSIS — I6529 Occlusion and stenosis of unspecified carotid artery: Secondary | ICD-10-CM | POA: Diagnosis not present

## 2017-03-14 DIAGNOSIS — E785 Hyperlipidemia, unspecified: Secondary | ICD-10-CM | POA: Insufficient documentation

## 2017-03-14 DIAGNOSIS — Z87891 Personal history of nicotine dependence: Secondary | ICD-10-CM | POA: Diagnosis not present

## 2017-03-14 DIAGNOSIS — Z9049 Acquired absence of other specified parts of digestive tract: Secondary | ICD-10-CM | POA: Diagnosis not present

## 2017-03-14 DIAGNOSIS — R0600 Dyspnea, unspecified: Secondary | ICD-10-CM | POA: Diagnosis not present

## 2017-03-14 DIAGNOSIS — I110 Hypertensive heart disease with heart failure: Secondary | ICD-10-CM | POA: Diagnosis not present

## 2017-03-14 DIAGNOSIS — Z853 Personal history of malignant neoplasm of breast: Secondary | ICD-10-CM | POA: Diagnosis not present

## 2017-03-14 DIAGNOSIS — I2510 Atherosclerotic heart disease of native coronary artery without angina pectoris: Secondary | ICD-10-CM | POA: Insufficient documentation

## 2017-03-14 DIAGNOSIS — E119 Type 2 diabetes mellitus without complications: Secondary | ICD-10-CM | POA: Insufficient documentation

## 2017-03-14 DIAGNOSIS — I428 Other cardiomyopathies: Secondary | ICD-10-CM | POA: Diagnosis not present

## 2017-03-14 DIAGNOSIS — Z7982 Long term (current) use of aspirin: Secondary | ICD-10-CM | POA: Insufficient documentation

## 2017-03-14 DIAGNOSIS — Z79899 Other long term (current) drug therapy: Secondary | ICD-10-CM | POA: Insufficient documentation

## 2017-03-14 DIAGNOSIS — Z720 Tobacco use: Secondary | ICD-10-CM

## 2017-03-14 DIAGNOSIS — Z9221 Personal history of antineoplastic chemotherapy: Secondary | ICD-10-CM | POA: Diagnosis not present

## 2017-03-14 LAB — BASIC METABOLIC PANEL
Anion gap: 13 (ref 5–15)
BUN: 19 mg/dL (ref 6–20)
CALCIUM: 9.2 mg/dL (ref 8.9–10.3)
CO2: 25 mmol/L (ref 22–32)
CREATININE: 1.16 mg/dL — AB (ref 0.44–1.00)
Chloride: 97 mmol/L — ABNORMAL LOW (ref 101–111)
GFR calc non Af Amer: 51 mL/min — ABNORMAL LOW (ref >60)
GFR, EST AFRICAN AMERICAN: 59 mL/min — AB (ref >60)
Glucose, Bld: 186 mg/dL — ABNORMAL HIGH (ref 65–99)
Potassium: 5 mmol/L (ref 3.5–5.1)
SODIUM: 135 mmol/L (ref 135–145)

## 2017-03-14 MED ORDER — CARVEDILOL 6.25 MG PO TABS: 9.3750 mg | ORAL_TABLET | Freq: Two times a day (BID) | ORAL | 3 refills | Status: DC

## 2017-03-14 NOTE — Progress Notes (Signed)
2D Echocardiogram has been performed.  Emily [name] 03/14/2017, 1:51 PM

## 2017-03-14 NOTE — Patient Instructions (Signed)
Increase Carvedilol to 9.375 mg (1 & 1/2 tabs) Twice daily   Your physician has requested that you have a carotid duplex. This test is an ultrasound of the carotid arteries in your neck. It looks at blood flow through these arteries that supply the brain with blood. Allow one hour for this exam. There are no restrictions or special instructions.  Your physician has recommended that you have a pulmonary function test. Pulmonary Function Tests are a group of tests that measure how well air moves in and out of your lungs.  Your physician recommends that you schedule a follow-up appointment in: 4-6 months

## 2017-03-14 NOTE — Progress Notes (Signed)
Patient ID: Emily [name], female   DOB: [birth year], 57 y.o.   MRN: [id number]  ADVANCED HF CLINIC NOTE  Patient ID: Emily [name], female   DOB: June 16, 1960, 57 y.o.   MRN: [id number] PCP: Dr Emily [name] Primary Cardiologist: Dr. Haroldine [name]  Emily [name] is a 57 yo woman with a history of ER + R breast cancer s/p R lumpectomy, DM2, cholelithiasis s/p cholecystectomy (02/2013), COPD and systolic CHF due to nonischemic cardiomyopathy.  Patient was admitted in 09/2012 with acute pulmonary edema and cardiogenic shock.  LHC showed moderate nonobstructive CAD (40-50% mLAD, 50-70% LCx, 50% PDA).  She was started on milrinone and diuresed.  Cause of cardiomyopathy suspected to be Adriamycin toxicity.    Echo 10/27/12 EF 20-25% Echo 01/09/13 EF 30-35% Echo 06/05/13 EF 45-50% septum dysynnergic Echo 5/17: EF 15-20% Echo 12/01/15 LVEF 55-60%  12/05/12 Carotid Dopplers- R 39 % stenosis and L  40-59% stenosis  Had surgery 4/17 in [place] to remove R chest leiomyosarcoma. Margins not clear so underwent repeat resection here with Dr. Barry [name].    She moved back from [place], [place] in May 2017 for recurrent HF symptome. Found to have EF 10-15% with NYHA IIIB-IV symptoms. Was admitted. UDS + cocaine. Cath showed non-obstructive CAD with Cardiogenic shock initial PA sat 34%, 42%. Started on milrinone. Diuresed about 5 pounds. Milrinone weaned off and co-ox dropped down to 52% but was feeling ok. Weight on d/c was 156.   Echo 10/17 EF 55-60%   Pt presents today for regular follow up. Feeling good. Had CT chest earlier this month which was clear. Has quit smoking for 1 week. Still gets SOB with mild activity. Recently had URI so a bit worse. No edema, orthopnea or PND. Takes lasix daily.   Echo today EF 45-50% (Personally reviewed)   Cath 07/14/15 Ao = 106/72 (87) LV = 108/28 (36) RA = 22 RV = 60/14/25 PA = 67/22 (51) PCW = 31 Fick cardiac output/index = 2.8/1.6 SVR = 1823 PVR = 7.0 WU FA sat = 90% PA sat = 34%,  42%  Assessment: 1. Mild non-obstructive CAD 2. Severe NICM with EF 25% by echo 3. Cardiogenic shock physiology with markedly elevated biventricular filling pressures Lexiscan 09/23/13: EF 50% normal perfusion Holter: SR occasional PVCs  PMH: 1. COPD: PFTs (1/14) with FEV1 68%.  Quit smoking 9/14.  2. Type II diabetes  3. HTN 4. Breast cancer: 2009, s/p Adriamycin/Cytoxan/Taxol chemo, lumpectomy and radiation.   5. Nonischemic cardiomyopathy: Possibly due to Adriamycin.  Echo (8/14) with EF 20-25%, global hypokinesis, moderate diastolic dysfunction, normal RV size and with mild to moderately decreased systolic function, moderate TR.   6. CAD: LHC (8/14) with nonobstructive CAD; 40-50% mLAD, 50-70% LCx, 50% PDA.  7. Hyperlipidemia  SH: Lives alone in [place].  Mother is next door.  Still smoking.   FH: No premature CAD.  No family history of cardiomyopathy.   Review of systems complete and found to be negative unless listed in HPI.    Current Outpatient Medications  Medication Sig Dispense Refill  . albuterol (PROVENTIL HFA;VENTOLIN HFA) 108 (90 BASE) MCG/ACT inhaler Inhale 2 puffs into the lungs every 6 (six) hours as needed for wheezing or shortness of breath. 1 Inhaler 0  . aspirin EC 81 MG tablet Take 1 tablet (81 mg total) by mouth daily. 90 tablet 3  . atorvastatin (LIPITOR) 20 MG tablet TAKE ONE TABLET BY MOUTH ONCE DAILY 90 tablet 2  . carvedilol (COREG) 6.25 MG tablet TAKE  ONE TABLET BY MOUTH TWICE DAILY 180 tablet 3  . clindamycin (CLEOCIN) 150 MG capsule Take 150 mg by mouth 3 (three) times daily.    Marland Kitchen ENTRESTO 97-103 MG TAKE ONE TABLET BY MOUTH TWICE DAILY 180 tablet 3  . fluconazole (DIFLUCAN) 150 MG tablet Take 150 mg by mouth daily.    . furosemide (LASIX) 40 MG tablet Take 1 tablet (40 mg total) by mouth daily. Take extra 40 mg tablet once daily AS NEEDED for weight gain 3 lbs or more overnight. 120 tablet 3  . glipiZIDE (GLUCOTROL) 10 MG tablet Take 10 mg in am and  10 mg before dinner 180 tablet 3  . HYDROcodone-acetaminophen (NORCO/VICODIN) 5-325 MG tablet Take 1 tablet by mouth every 6 (six) hours as needed for moderate pain.    . hydrOXYzine (VISTARIL) 100 MG capsule Take 1 capsule (100 mg total) by mouth at bedtime as needed. 30 capsule 1  . lamoTRIgine (LAMICTAL) 100 MG tablet Take 1 tablet (100 mg total) by mouth daily. 30 tablet 1  . letrozole (FEMARA) 2.5 MG tablet TAKE ONE TABLET BY MOUTH IN THE MORNING 90 tablet 2  . metFORMIN (GLUCOPHAGE) 1000 MG tablet TAKE 1 TABLET BY MOUTH TWICE DAILY WITH MEALS 180 tablet 1  . spironolactone (ALDACTONE) 25 MG tablet Take 12.5 mg by mouth daily.    Marland Kitchen venlafaxine XR (EFFEXOR-XR) 150 MG 24 hr capsule Take 1 capsule (150 mg total) by mouth daily with breakfast. 30 capsule 1   No current facility-administered medications for this encounter.     Vitals:   03/14/17 1355  BP: 110/75  Pulse: 95  SpO2: 98%  Weight: 162 lb (73.5 kg)   Wt Readings from Last 3 Encounters:  03/14/17 162 lb (73.5 kg)  10/12/16 171 lb 4 oz (77.7 kg)  09/26/16 170 lb (77.1 kg)    General:  Well appearing. No resp difficulty HEENT: normal Neck: supple. no JVD. Carotids 2+ bilat; no bruits. No lymphadenopathy or thryomegaly appreciated. Cor: PMI nondisplaced. Regular rate & rhythm. No rubs, gallops or murmurs. Lungs: clear but decreased throughout  Abdomen: soft, nontender, nondistended. No hepatosplenomegaly. No bruits or masses. Good bowel sounds. Extremities: no cyanosis, clubbing, rash, edema Neuro: alert & orientedx3, cranial nerves grossly intact. moves all 4 extremities w/o difficulty. Affect pleasant   Assessment/Plan:  1. Chronic systolic CHF: NICM, likely Adriamycin toxicity,. EF had previously recovered but now way back down  20%.  - Admitted for cardiogenic shock 5/17. Required milrinone. Now off.  - Echo 12/01/15 with EF LVEF 55-60%   - Echo today EF 45-50% Personally reviewed - Stable NYHA III symptoms.  -  Volume status stable on exam.  -Continue Entresto 97/103. BMET today.  -Continue spiro 12.5 mg daily. Unable to tolerate 25 daily due to hyperK -Continue lasix 40 daily. Can take extra 40 mg as needed for weight gain. (Will make sure her lasix refill accomodates) - Increase carvedilol to 9.375 BID - Reinforced fluid restriction to < 2 L daily, sodium restriction to less than 2000 mg daily, and the importance of daily weights.   - Labs today - 2. CAD: Moderate nonobstructive disease. -Continue ASA and statin - No s/s ischemia 3. COPD/tobacco use - Congratulated her on quitting. - Given SOB and COPD will check PFTs 4. Cocaine use - Abstinent 5. Breast Cancer: - Previously received adriamycin. Cancer free for > 5 years. No change. 6. Carotid stenosis:  - Asymptomatic.Due for u/s. 7. R chest leiomyosarcoma: - Had further resections and  margins now clear. Sees Dr. Alen Blew and Dr. Barry [name]. No change.    Glori Bickers, MD  2:00 PM

## 2017-03-16 ENCOUNTER — Encounter: Payer: Self-pay | Admitting: *Deleted

## 2017-03-19 ENCOUNTER — Telehealth: Payer: Self-pay | Admitting: Oncology

## 2017-03-19 NOTE — Telephone Encounter (Signed)
Rescheduled appt per 1/18 sch msg - spoke with patient and confirmed appt.

## 2017-03-22 ENCOUNTER — Ambulatory Visit (HOSPITAL_COMMUNITY)
Admission: RE | Admit: 2017-03-22 | Discharge: 2017-03-22 | Disposition: A | Discharge status: Home / Self Care | Payer: Medicare Other | Source: Ambulatory Visit | Attending: Internal Medicine | Admitting: Internal Medicine

## 2017-03-22 ENCOUNTER — Ambulatory Visit (HOSPITAL_BASED_OUTPATIENT_CLINIC_OR_DEPARTMENT_OTHER)
Admission: RE | Admit: 2017-03-22 | Discharge: 2017-03-22 | Disposition: A | Discharge status: Home / Self Care | Payer: Medicare Other | Source: Ambulatory Visit | Attending: Cardiovascular Disease | Admitting: Cardiovascular Disease

## 2017-03-22 DIAGNOSIS — I6523 Occlusion and stenosis of bilateral carotid arteries: Secondary | ICD-10-CM

## 2017-03-22 DIAGNOSIS — R0600 Dyspnea, unspecified: Secondary | ICD-10-CM | POA: Diagnosis not present

## 2017-03-22 LAB — PULMONARY FUNCTION TEST
DL/VA % PRED: 90 %
DL/VA: 4.45 ml/min/mmHg/L
DLCO UNC: 15.78 ml/min/mmHg
DLCO unc % pred: 61 %
FEF 25-75 POST: 1.53 L/s
FEF 25-75 PRE: 0.96 L/s
FEF2575-%Change-Post: 59 %
FEF2575-%Pred-Post: 62 %
FEF2575-%Pred-Pre: 38 %
FEV1-%Change-Post: 15 %
FEV1-%Pred-Post: 52 %
FEV1-%Pred-Pre: 45 %
FEV1-Post: 1.42 L
FEV1-Pre: 1.23 L
FEV1FVC-%Change-Post: 8 %
FEV1FVC-%Pred-Pre: 93 %
FEV6-%CHANGE-POST: 6 %
FEV6-%PRED-POST: 53 %
FEV6-%PRED-PRE: 50 %
FEV6-Post: 1.8 L
FEV6-Pre: 1.69 L
FEV6FVC-%PRED-POST: 103 %
FEV6FVC-%Pred-Pre: 103 %
FVC-%Change-Post: 6 %
FVC-%Pred-Post: 51 %
FVC-%Pred-Pre: 48 %
FVC-Post: 1.8 L
FVC-Pre: 1.69 L
POST FEV6/FVC RATIO: 100 %
PRE FEV1/FVC RATIO: 73 %
Post FEV1/FVC ratio: 79 %
Pre FEV6/FVC Ratio: 100 %
RV % pred: 82 %
RV: 1.67 L
TLC % PRED: 69 %
TLC: 3.62 L

## 2017-03-22 MED ORDER — ALBUTEROL SULFATE (2.5 MG/3ML) 0.083% IN NEBU
2.5000 mg | INHALATION_SOLUTION | Freq: Once | RESPIRATORY_TRACT | Status: AC
  Administered 2017-03-22: 2.5 mg via RESPIRATORY_TRACT

## 2017-03-29 ENCOUNTER — Telehealth (HOSPITAL_COMMUNITY): Payer: Self-pay | Admitting: *Deleted

## 2017-03-29 NOTE — Telephone Encounter (Signed)
Pt given results of carotids and pfts

## 2017-04-03 ENCOUNTER — Ambulatory Visit (INDEPENDENT_AMBULATORY_CARE_PROVIDER_SITE_OTHER): Payer: Medicare Other | Admitting: Family Medicine

## 2017-04-03 ENCOUNTER — Encounter: Payer: Self-pay | Admitting: Family Medicine

## 2017-04-03 VITALS — BP 118/70 | HR 91 | Temp 98.2°F | Ht 65.0 in | Wt 156.0 lb

## 2017-04-03 DIAGNOSIS — L299 Pruritus, unspecified: Secondary | ICD-10-CM | POA: Diagnosis not present

## 2017-04-03 DIAGNOSIS — Z23 Encounter for immunization: Secondary | ICD-10-CM

## 2017-04-03 DIAGNOSIS — G4700 Insomnia, unspecified: Secondary | ICD-10-CM | POA: Diagnosis not present

## 2017-04-03 DIAGNOSIS — G90529 Complex regional pain syndrome I of unspecified lower limb: Secondary | ICD-10-CM | POA: Diagnosis not present

## 2017-04-03 DIAGNOSIS — E1159 Type 2 diabetes mellitus with other circulatory complications: Secondary | ICD-10-CM

## 2017-04-03 DIAGNOSIS — I2510 Atherosclerotic heart disease of native coronary artery without angina pectoris: Secondary | ICD-10-CM | POA: Diagnosis not present

## 2017-04-03 DIAGNOSIS — E1165 Type 2 diabetes mellitus with hyperglycemia: Secondary | ICD-10-CM | POA: Diagnosis not present

## 2017-04-03 DIAGNOSIS — I2584 Coronary atherosclerosis due to calcified coronary lesion: Secondary | ICD-10-CM

## 2017-04-03 LAB — COMPREHENSIVE METABOLIC PANEL
ALT: 17 U/L (ref 0–35)
AST: 21 U/L (ref 0–37)
Albumin: 4 g/dL (ref 3.5–5.2)
Alkaline Phosphatase: 57 U/L (ref 39–117)
BILIRUBIN TOTAL: 0.5 mg/dL (ref 0.2–1.2)
BUN: 16 mg/dL (ref 6–23)
CO2: 28 mEq/L (ref 19–32)
Calcium: 9.1 mg/dL (ref 8.4–10.5)
Chloride: 99 mEq/L (ref 96–112)
Creatinine, Ser: 1.05 mg/dL (ref 0.40–1.20)
GFR: 57.08 mL/min — AB (ref >60.00)
Glucose, Bld: 213 mg/dL — ABNORMAL HIGH (ref 70–99)
POTASSIUM: 4.9 meq/L (ref 3.5–5.1)
SODIUM: 136 meq/L (ref 135–145)
TOTAL PROTEIN: 7.3 g/dL (ref 6.0–8.3)

## 2017-04-03 LAB — CBC
HEMATOCRIT: 40.3 % (ref 36.0–46.0)
HEMOGLOBIN: 13.4 g/dL (ref 12.0–15.0)
MCHC: 33.2 g/dL (ref 30.0–36.0)
MCV: 88.6 fl (ref 78.0–100.0)
Platelets: 218 10*3/uL (ref 150.0–400.0)
RBC: 4.55 Mil/uL (ref 3.87–5.11)
RDW: 14.6 % (ref 11.5–15.5)
WBC: 6.8 10*3/uL (ref 4.0–10.5)

## 2017-04-03 LAB — HEMOGLOBIN A1C: Hgb A1c MFr Bld: 8.2 % — ABNORMAL HIGH (ref 4.6–6.5)

## 2017-04-03 LAB — LIPID PANEL
Cholesterol: 117 mg/dL (ref 0–200)
HDL: 30.9 mg/dL — AB (ref >39.00)
LDL Cholesterol: 61 mg/dL (ref 0–99)
NONHDL: 85.99
Total CHOL/HDL Ratio: 4
Triglycerides: 125 mg/dL (ref 0.0–149.0)
VLDL: 25 mg/dL (ref 0.0–40.0)

## 2017-04-03 MED ORDER — LORAZEPAM 2 MG PO TABS: 2.0000 mg | ORAL_TABLET | Freq: Every evening | ORAL | 1 refills | Status: DC | PRN

## 2017-04-03 MED ORDER — HYDROXYZINE HCL 50 MG PO TABS: 50.0000 mg | ORAL_TABLET | Freq: Three times a day (TID) | ORAL | 3 refills | Status: DC | PRN

## 2017-04-03 NOTE — Patient Instructions (Signed)
I will send in ativan and hydroxyzine to your pharmacy.  We will check blood work.  I put in a referral to pain management.  Come back to see me in 3-6 months, or sooner as needed.  Take care, Dr Jerline Pain

## 2017-04-03 NOTE — Progress Notes (Signed)
Subjective:  Emily [name] is a 57 y.o. female who presents today with a chief complaint of CRPSand to transfer care to this office.   HPI:  Complex regional pain syndrome, new problem Several year history.  Patient reports that she was diagnosed with this by her neurologist.  Symptoms are mostly confined to her left lower extremity and foot.  She has tried several medications in the past including amitriptyline, Cymbalta, Mobic, tramadol, gabapentin, Lyrica, and Effexor.  No these medications of significantly seem to help.  Symptoms are worse at night.  She has significant pain with mild touching of the skin on her left lower extremity.  Thinks that the symptoms started after an orthopedic procedure on her left foot.  Type 2 diabetes, established problem, uncontrolled Patient currently on metformin 1000 mg twice daily and glipizide 10 mg daily.  She is compliant with these medications without any reported side effects.  She does not check her blood sugars at home.  Patient reports that she is unable to afford insulin.  Insomnia, established problem, uncontrolled Several year history.  She has been on Ativan for this in the past however is not been on anything for the past several months.  She has been on Ativan for several years and tolerated this well without side effects.  She has difficulty falling asleep and staying asleep.  Pruritus, established problem, uncontrolled Symptoms seem to be worse at night.  She has generalized pruritus.  She has poorly been on hydroxyzine for this however was unable to afford her most recent prescription for this.  No obvious rashes.  No fevers or chills.  No skin changes.  ROS: Per HPI  PMH: She reports that she quit smoking about 3 weeks ago. Her smoking use included cigarettes. She has a 40.00 pack-year smoking history. she has never used smokeless tobacco. She reports that she drinks about 3.6 oz of alcohol per week. She reports that she does not use  drugs.   Objective:  Physical Exam: BP 118/70 (BP Location: Left Arm, Patient Position: Sitting, Cuff Size: Normal)   Pulse 91   Temp 98.2 F (36.8 C) (Oral)   Ht 5\' 5"  (1.651 m)   Wt 156 lb (70.8 kg)   SpO2 97%   BMI 25.96 kg/m   Gen: NAD, resting comfortably CV: RRR with no murmurs appreciated Pulm: NWOB, faint end expiratory wheeze, otherwise clear to auscultation. MSK: -Bilateral feet: No obvious deformities.  Skin thin with paperlike appearance on feet bilaterally.  Decreased sensation light touch distally.  Allodynia noted on dorsal aspect of left foot.  Assessment/Plan:  Complex regional pain syndrome type 1 of lower extremity Patient has exhausted nearly all of her nonnarcotic options.  Discussed with patient that I would be unable to provide chronic narcotics.  Will place referral to pain management.  Poorly controlled type 2 diabetes mellitus with circulatory disorder (HCC) Check A1c today.  Continue metformin and glipizide.  Follow up in 3-6 months.   Insomnia Discussed risks associated with chronic benzo use.  Patient voiced understanding of the risks.  We refilled her Ativan today.  Her database was reviewed without red flags.  Patient additionally reviewed a controlled substance contract.  We also start hydroxyzine which should help some with her insomnia.  Follow-up in 3-6 months.  Pruritus Unclear etiology- may be medication side effect.  Sent in prescription for hydroxyzine-hopefully this will also help some with her insomnia.  Preventative healthcare Flu shot given today.  Patient also due for  Pap, ophthalmology exam, and urine microalbumin-we will discuss these at her next visit. Not discussed today due to time constraints.  [name]. Jerline Pain, MD 04/03/2017 3:24 PM

## 2017-04-03 NOTE — Assessment & Plan Note (Signed)
Patient has exhausted nearly all of her nonnarcotic options.  Discussed with patient that I would be unable to provide chronic narcotics.  Will place referral to pain management.

## 2017-04-03 NOTE — Assessment & Plan Note (Signed)
Discussed risks associated with chronic benzo use.  Patient voiced understanding of the risks.  We refilled her Ativan today.  Her database was reviewed without red flags.  Patient additionally reviewed a controlled substance contract.  We also start hydroxyzine which should help some with her insomnia.  Follow-up in 3-6 months.

## 2017-04-03 NOTE — Assessment & Plan Note (Signed)
Check A1c today.  Continue metformin and glipizide.  Follow up in 3-6 months.

## 2017-04-03 NOTE — Assessment & Plan Note (Signed)
Unclear etiology- may be medication side effect.  Sent in prescription for hydroxyzine-hopefully this will also help some with her insomnia.

## 2017-04-04 ENCOUNTER — Inpatient Hospital Stay: Admission: RE | Admit: 2017-04-04 | Payer: Self-pay | Source: Ambulatory Visit

## 2017-04-06 ENCOUNTER — Encounter: Payer: Self-pay | Admitting: *Deleted

## 2017-04-10 ENCOUNTER — Ambulatory Visit (HOSPITAL_COMMUNITY): Payer: Medicare Other | Admitting: Psychiatry

## 2017-04-20 ENCOUNTER — Ambulatory Visit: Payer: Self-pay | Admitting: Oncology

## 2017-04-23 ENCOUNTER — Other Ambulatory Visit (HOSPITAL_COMMUNITY): Payer: Self-pay

## 2017-04-23 ENCOUNTER — Ambulatory Visit (HOSPITAL_COMMUNITY): Payer: Self-pay | Admitting: Psychiatry

## 2017-04-23 DIAGNOSIS — F321 Major depressive disorder, single episode, moderate: Secondary | ICD-10-CM

## 2017-04-23 MED ORDER — LAMOTRIGINE 100 MG PO TABS: 100.0000 mg | ORAL_TABLET | Freq: Every day | ORAL | 0 refills | Status: DC

## 2017-04-23 MED ORDER — HYDROXYZINE PAMOATE 100 MG PO CAPS: 100.0000 mg | ORAL_CAPSULE | Freq: Every evening | ORAL | 1 refills | Status: DC | PRN

## 2017-04-23 MED ORDER — VENLAFAXINE HCL ER 150 MG PO CP24: 150.0000 mg | ORAL_CAPSULE | Freq: Every day | ORAL | 0 refills | Status: DC

## 2017-05-07 ENCOUNTER — Telehealth: Payer: Self-pay | Admitting: Family Medicine

## 2017-05-07 ENCOUNTER — Other Ambulatory Visit: Payer: Self-pay | Admitting: *Deleted

## 2017-05-07 ENCOUNTER — Ambulatory Visit (HOSPITAL_COMMUNITY): Payer: Self-pay | Admitting: Psychiatry

## 2017-05-07 DIAGNOSIS — IMO0001 Reserved for inherently not codable concepts without codable children: Secondary | ICD-10-CM

## 2017-05-07 DIAGNOSIS — E1165 Type 2 diabetes mellitus with hyperglycemia: Principal | ICD-10-CM

## 2017-05-07 MED ORDER — METFORMIN HCL 1000 MG PO TABS: 1000.0000 mg | ORAL_TABLET | Freq: Two times a day (BID) | ORAL | 1 refills | Status: DC

## 2017-05-07 MED ORDER — GLIPIZIDE 10 MG PO TABS: ORAL_TABLET | ORAL | 1 refills | Status: DC

## 2017-05-07 NOTE — Telephone Encounter (Unsigned)
Copied from [place]. Topic: Quick Communication - Rx Refill/Question >> May 07, 2017 11:30 AM Percell Belt A wrote: Medication: glipiZIDE (GLUCOTROL) 10 MG tablet [[id number]] and  metFORMIN (GLUCOPHAGE) 1000 MG tablet [[id number]]   Has the patient contacted their pharmacy? no   (Agent: If no, request that the patient contact the pharmacy for the refill.)   Preferred Pharmacy (with phone number or street name): 90 day supply sent to [facility] in [place]   Agent: Please be advised that RX refills may take up to 3 business days. We ask that you follow-up with your pharmacy.

## 2017-05-07 NOTE — Telephone Encounter (Signed)
Rx refilled per protocol- LOV: 04/03/17

## 2017-06-10 ENCOUNTER — Other Ambulatory Visit (HOSPITAL_COMMUNITY): Payer: Self-pay | Admitting: Student

## 2017-06-12 ENCOUNTER — Other Ambulatory Visit (HOSPITAL_COMMUNITY): Payer: Self-pay | Admitting: *Deleted

## 2017-06-12 MED ORDER — FUROSEMIDE 40 MG PO TABS: ORAL_TABLET | ORAL | 3 refills | Status: DC

## 2017-06-14 ENCOUNTER — Ambulatory Visit (INDEPENDENT_AMBULATORY_CARE_PROVIDER_SITE_OTHER): Payer: Medicare Other | Admitting: Family Medicine

## 2017-06-14 ENCOUNTER — Encounter: Payer: Self-pay | Admitting: Family Medicine

## 2017-06-14 ENCOUNTER — Other Ambulatory Visit: Payer: Self-pay

## 2017-06-14 VITALS — BP 110/72 | HR 90 | Temp 97.9°F | Resp 16 | Ht 65.0 in | Wt 162.0 lb

## 2017-06-14 DIAGNOSIS — M546 Pain in thoracic spine: Secondary | ICD-10-CM | POA: Diagnosis not present

## 2017-06-14 MED ORDER — CYCLOBENZAPRINE HCL 10 MG PO TABS: 10.0000 mg | ORAL_TABLET | Freq: Three times a day (TID) | ORAL | 0 refills | Status: DC | PRN

## 2017-06-14 NOTE — Patient Instructions (Signed)
Please follow up if symptoms do not improve or as needed.    Back Pain, Adult Many adults have back pain from time to time. Common causes of back pain include:  A strained muscle or ligament.  Wear and tear (degeneration) of the spinal disks.  Arthritis.  A hit to the back.  Back pain can be short-lived (acute) or last a long time (chronic). A physical exam, lab tests, and imaging studies may be done to find the cause of your pain. Follow these instructions at home: Managing pain and stiffness  Take over-the-counter and prescription medicines only as told by your health care provider.  If directed, apply heat to the affected area as often as told by your health care provider. Use the heat source that your health care provider recommends, such as a moist heat pack or a heating pad. ? Place a towel between your skin and the heat source. ? Leave the heat on for 20-30 minutes. ? Remove the heat if your skin turns bright red. This is especially important if you are unable to feel pain, heat, or cold. You have a greater risk of getting burned.  If directed, apply ice to the injured area: ? Put ice in a plastic bag. ? Place a towel between your skin and the bag. ? Leave the ice on for 20 minutes, 2-3 times a day for the first 2-3 days. Activity  Do not stay in bed. Resting more than 1-2 days can delay your recovery.  Take short walks on even surfaces as soon as you are able. Try to increase the length of time you walk each day.  Do not sit, drive, or stand in one place for more than 30 minutes at a time. Sitting or standing for long periods of time can put stress on your back.  Use proper lifting techniques. When you bend and lift, use positions that put less stress on your back: ? Bend your knees. ? Keep the load close to your body. ? Avoid twisting.  Exercise regularly as told by your health care provider. Exercising will help your back heal faster. This also helps prevent back  injuries by keeping muscles strong and flexible.  Your health care provider may recommend that you see a physical therapist. This person can help you come up with a safe exercise program. Do any exercises as told by your physical therapist. Lifestyle  Maintain a healthy weight. Extra weight puts stress on your back and makes it difficult to have good posture.  Avoid activities or situations that make you feel anxious or stressed. Learn ways to manage anxiety and stress. One way to manage stress is through exercise. Stress and anxiety increase muscle tension and can make back pain worse. General instructions  Sleep on a firm mattress in a comfortable position. Try lying on your side with your knees slightly bent. If you lie on your back, put a pillow under your knees.  Follow your treatment plan as told by your health care provider. This may include: ? Cognitive or behavioral therapy. ? Acupuncture or massage therapy. ? Meditation or yoga. Contact a health care provider if:  You have pain that is not relieved with rest or medicine.  You have increasing pain going down into your legs or buttocks.  Your pain does not improve in 2 weeks.  You have pain at night.  You lose weight.  You have a fever or chills. Get help right away if:  You develop new bowel   or bladder control problems.  You have unusual weakness or numbness in your arms or legs.  You develop nausea or vomiting.  You develop abdominal pain.  You feel faint. Summary  Many adults have back pain from time to time. A physical exam, lab tests, and imaging studies may be done to find the cause of your pain.  Use proper lifting techniques. When you bend and lift, use positions that put less stress on your back.  Take over-the-counter and prescription medicines and apply heat or ice as directed by your health care provider. This information is not intended to replace advice given to you by your health care provider.  Make sure you discuss any questions you have with your health care provider. Document Released: 02/13/2005 Document Revised: 03/20/2016 Document Reviewed: 03/20/2016 Elsevier Interactive Patient Education  2018 [employer]  

## 2017-06-14 NOTE — Progress Notes (Signed)
Subjective  CC:  Chief Complaint  Patient presents with  . Back Pain    Mid-back pain from putting out mulch    HPI: Emily [name] is a 57 y.o. female who presents to the office today to address the problems listed above in the chief complaint.  Pt of Dr. Jerline Pain. Has chronic low back pain and CRPS but here due to 1-2 days of upper back pain due to putting out mulch. otc meds not helping. Having difficulty sleeping due to pain. No neck pain, no radicular sxs. No change in low back pain. No Bowel or bladder dysfunction. Request mm relaxer to see if that will help. Has new pt appt scheduled with pain clinic soon. No other concerns.   I reviewed the patients updated PMH, FH, and SocHx.    Patient Active Problem List   Diagnosis Date Noted  . Complex regional pain syndrome type 1 of lower extremity 04/03/2017  . Chronic heart failure with normal ejection fraction ([place]) 05/03/2016  . Chronic bronchitis ([place])   . Osteoporosis 03/17/2016  . Sarcoma ([place]) 10/22/2015  . Palpitations 09/01/2015  . Depression   . DNR (do not resuscitate) discussion   . Palliative care encounter   . History of breast cancer 10/02/2014  . Sciatica of left side 09/30/2014  . Vitamin D deficiency 02/24/2014  . Pruritus 02/24/2014  . Carotid stenosis 11/12/2013  . Raynaud's phenomenon 10/27/2013  . Neuropathic pain 04/08/2013  . Fatty liver disease, nonalcoholic 89/38/1017  . CAD (coronary artery disease) 11/12/2012  . GERD (gastroesophageal reflux disease)   . Dyslipidemia   . Insomnia   . Poorly controlled type 2 diabetes mellitus with circulatory disorder ([place]) 01/01/2012   Current Meds  Medication Sig  . albuterol (PROVENTIL HFA;VENTOLIN HFA) 108 (90 BASE) MCG/ACT inhaler Inhale 2 puffs into the lungs every 6 (six) hours as needed for wheezing or shortness of breath.  Marland Kitchen aspirin EC 81 MG tablet Take 1 tablet (81 mg total) by mouth daily.  Marland Kitchen atorvastatin (LIPITOR) 20 MG tablet TAKE ONE TABLET BY  MOUTH ONCE DAILY  . Calcium 500-125 MG-UNIT TABS Take by mouth.  . carvedilol (COREG) 6.25 MG tablet Take 1.5 tablets (9.375 mg total) by mouth 2 (two) times daily.  Marland Kitchen ENTRESTO 97-103 MG TAKE ONE TABLET BY MOUTH TWICE DAILY  . furosemide (LASIX) 40 MG tablet TAKE 1 TABLET BY MOUTH ONCE DAILY TAKE  EXTRA  40  MG  TABLET  ONCE  DAILY AS NEEDED FOR  WEIGHT  GAIN  3  POUNDS  OR  MORE  OVERNIGHT  . glipiZIDE (GLUCOTROL) 10 MG tablet Take 10 mg in am and 10 mg before dinner  . hydrOXYzine (ATARAX/VISTARIL) 50 MG tablet Take 1-2 tablets (50-100 mg total) by mouth 3 (three) times daily as needed for itching.  . letrozole (FEMARA) 2.5 MG tablet TAKE ONE TABLET BY MOUTH IN THE MORNING  . LORazepam (ATIVAN) 2 MG tablet Take 1 tablet (2 mg total) by mouth at bedtime as needed for anxiety.  . metFORMIN (GLUCOPHAGE) 1000 MG tablet Take 1 tablet (1,000 mg total) by mouth 2 (two) times daily with a meal.  . ofloxacin (OCUFLOX) 0.3 % ophthalmic solution   . omeprazole (PRILOSEC) 20 MG capsule Take by mouth.  . spironolactone (ALDACTONE) 25 MG tablet Take 12.5 mg by mouth daily.    Allergies: Patient is allergic to contrast media [iodinated diagnostic agents] and gadolinium. Family History: Patient family history includes Alcohol abuse in her father; Arthritis in  her brother; Bladder Cancer in her mother; Breast cancer in her cousin; Cancer in her mother; Congestive Heart Failure in her maternal aunt and maternal grandmother; Diabetes in her mother; Diabetic kidney disease in her maternal uncle; Heart disease in her maternal uncle; Osteogenesis imperfecta in her brother; Ovarian cancer in her maternal aunt. Social History:  Patient  reports that she quit smoking about 3 months ago. Her smoking use included cigarettes. She has a 40.00 pack-year smoking history. She has never used smokeless tobacco. She reports that she drinks about 3.6 oz of alcohol per week. She reports that she does not use drugs.  Review of  Systems: Constitutional: Negative for fever malaise or anorexia Cardiovascular: negative for chest pain Respiratory: negative for SOB or persistent cough Gastrointestinal: negative for abdominal pain  Objective  Vitals: BP 110/72   Pulse 90   Temp 97.9 F (36.6 C) (Oral)   Resp 16   Ht 5\' 5"  (1.651 m)   Wt 162 lb (73.5 kg)   SpO2 95%   BMI 26.96 kg/m  General: no acute distress , A&Ox3, appears comfortable. Moves easily Back: subscapularis ttp bilateral, no midline ttp;  Lungs: wheezing with full breaths. No rales Assessment  1. Acute bilateral thoracic back pain      Plan   BP due to overuse/strain:  Flexeril with education on side effects and expectations. F/u with PCP if needed  Follow up: Return if symptoms worsen or fail to improve.    Commons side effects, risks, benefits, and alternatives for medications and treatment plan prescribed today were discussed, and the patient expressed understanding of the given instructions. Patient is instructed to call or message via MyChart if he/she has any questions or concerns regarding our treatment plan. No barriers to understanding were identified. We discussed Red Flag symptoms and signs in detail. Patient expressed understanding regarding what to do in case of urgent or emergency type symptoms.   Medication list was reconciled, printed and provided to the patient in AVS. Patient instructions and summary information was reviewed with the patient as documented in the AVS. This note was prepared with assistance of Dragon voice recognition software. Occasional wrong-word or sound-a-like substitutions may have occurred due to the inherent limitations of voice recognition software  No orders of the defined types were placed in this encounter.  Meds ordered this encounter  Medications  . cyclobenzaprine (FLEXERIL) 10 MG tablet    Sig: Take 1 tablet (10 mg total) by mouth 3 (three) times daily as needed for muscle spasms.    Dispense:   30 tablet    Refill:  0

## 2017-06-25 ENCOUNTER — Other Ambulatory Visit (HOSPITAL_COMMUNITY): Payer: Self-pay | Admitting: Internal Medicine

## 2017-07-04 ENCOUNTER — Ambulatory Visit (HOSPITAL_COMMUNITY)
Admission: RE | Admit: 2017-07-04 | Discharge: 2017-07-04 | Disposition: A | Discharge status: Home / Self Care | Payer: Medicare Other | Source: Ambulatory Visit | Attending: Cardiology | Admitting: Cardiology

## 2017-07-04 ENCOUNTER — Encounter (HOSPITAL_COMMUNITY): Payer: Self-pay

## 2017-07-04 VITALS — BP 86/66 | HR 94 | Wt 159.4 lb

## 2017-07-04 DIAGNOSIS — I5022 Chronic systolic (congestive) heart failure: Secondary | ICD-10-CM | POA: Diagnosis present

## 2017-07-04 DIAGNOSIS — Z79899 Other long term (current) drug therapy: Secondary | ICD-10-CM | POA: Insufficient documentation

## 2017-07-04 DIAGNOSIS — Z87891 Personal history of nicotine dependence: Secondary | ICD-10-CM | POA: Insufficient documentation

## 2017-07-04 DIAGNOSIS — Z7982 Long term (current) use of aspirin: Secondary | ICD-10-CM | POA: Diagnosis not present

## 2017-07-04 DIAGNOSIS — Z9889 Other specified postprocedural states: Secondary | ICD-10-CM | POA: Diagnosis not present

## 2017-07-04 DIAGNOSIS — I5032 Chronic diastolic (congestive) heart failure: Secondary | ICD-10-CM | POA: Diagnosis not present

## 2017-07-04 DIAGNOSIS — I429 Cardiomyopathy, unspecified: Secondary | ICD-10-CM | POA: Diagnosis not present

## 2017-07-04 DIAGNOSIS — I2584 Coronary atherosclerosis due to calcified coronary lesion: Secondary | ICD-10-CM

## 2017-07-04 DIAGNOSIS — F17200 Nicotine dependence, unspecified, uncomplicated: Secondary | ICD-10-CM

## 2017-07-04 DIAGNOSIS — E875 Hyperkalemia: Secondary | ICD-10-CM | POA: Insufficient documentation

## 2017-07-04 DIAGNOSIS — Z853 Personal history of malignant neoplasm of breast: Secondary | ICD-10-CM | POA: Insufficient documentation

## 2017-07-04 DIAGNOSIS — Z7984 Long term (current) use of oral hypoglycemic drugs: Secondary | ICD-10-CM | POA: Diagnosis not present

## 2017-07-04 DIAGNOSIS — J449 Chronic obstructive pulmonary disease, unspecified: Secondary | ICD-10-CM | POA: Diagnosis not present

## 2017-07-04 DIAGNOSIS — E119 Type 2 diabetes mellitus without complications: Secondary | ICD-10-CM | POA: Insufficient documentation

## 2017-07-04 DIAGNOSIS — R0600 Dyspnea, unspecified: Secondary | ICD-10-CM | POA: Diagnosis not present

## 2017-07-04 DIAGNOSIS — E785 Hyperlipidemia, unspecified: Secondary | ICD-10-CM | POA: Insufficient documentation

## 2017-07-04 DIAGNOSIS — I2510 Atherosclerotic heart disease of native coronary artery without angina pectoris: Secondary | ICD-10-CM

## 2017-07-04 DIAGNOSIS — I493 Ventricular premature depolarization: Secondary | ICD-10-CM | POA: Insufficient documentation

## 2017-07-04 DIAGNOSIS — I6529 Occlusion and stenosis of unspecified carotid artery: Secondary | ICD-10-CM | POA: Insufficient documentation

## 2017-07-04 LAB — BASIC METABOLIC PANEL
ANION GAP: 11 (ref 5–15)
BUN: 28 mg/dL — ABNORMAL HIGH (ref 6–20)
CHLORIDE: 102 mmol/L (ref 101–111)
CO2: 25 mmol/L (ref 22–32)
Calcium: 9 mg/dL (ref 8.9–10.3)
Creatinine, Ser: 1.47 mg/dL — ABNORMAL HIGH (ref 0.44–1.00)
GFR calc non Af Amer: 38 mL/min — ABNORMAL LOW (ref >60)
GFR, EST AFRICAN AMERICAN: 44 mL/min — AB (ref >60)
Glucose, Bld: 208 mg/dL — ABNORMAL HIGH (ref 65–99)
Potassium: 4.5 mmol/L (ref 3.5–5.1)
Sodium: 138 mmol/L (ref 135–145)

## 2017-07-04 NOTE — Progress Notes (Signed)
Patient ID: [name], female   DOB: Aug 01, 1960, 57 y.o.   MRN: [id number]  ADVANCED HF CLINIC NOTE  Patient ID: ATLANTIS DELONG, female   DOB: [birth year], 57 y.o.   MRN: [id number] PCP: Dr Kathlen Mody Primary Cardiologist: Dr. Haroldine Laws Oncologist: Dr Tami Lin   Ms. Hoselton is a 57 yo woman with a history of ER + R breast cancer s/p R lumpectomy, DM2, cholelithiasis s/p cholecystectomy (02/2013), COPD and systolic CHF due to nonischemic cardiomyopathy.  Patient was admitted in 09/2012 with acute pulmonary edema and cardiogenic shock.  LHC showed moderate nonobstructive CAD (40-50% mLAD, 50-70% LCx, 50% PDA).  She was started on milrinone and diuresed.  Cause of cardiomyopathy suspected to be Adriamycin toxicity.    12/05/12 Carotid Dopplers- R 39 % stenosis and L  40-59% stenosis  Had surgery 4/17 in [place] to remove R chest leiomyosarcoma. Margins not clear so underwent repeat resection here with Dr. Barry Dienes.    Admitted May 2017 for recurrent HF symptome. Found to have EF 10-15% with NYHA IIIB-IV symptoms. Was admitted. UDS + cocaine. Cath showed non-obstructive CAD with Cardiogenic shock initial PA sat 34%, 42%. Started on milrinone. Diuresed about 5 pounds. Milrinone weaned off and co-ox dropped down to 52% but was feeling ok.   Today she returns for HF follow up. Last visit carvedilol was increased to 9.375 mg twice a day. Overall feeling fine. Denies SOB/PND/Orthopnea. No chest pain. Appetite ok. No fever or chills. Weight at home 153-155  pounds. Smoking 1 PPD. Taking all medications.  Echo 10/27/12 EF 20-25% Echo 01/09/13 EF 30-35% Echo 06/05/13 EF 45-50% septum dysynnergic Echo 5/17: EF 15-20% Echo 12/01/15 LVEF 55-60% Echo 02/2017 EF 40-45%.   Cath 07/14/15 Ao = 106/72 (87) LV = 108/28 (36) RA = 22 RV = 60/14/25 RA = 67/22 (51) PCW = 31 Fick cardiac output/index = 2.8/1.6 SVR = 1823 PVR = 7.0 WU FA sat = 90% PA sat = 34%, 42%  Assessment: 1. Mild non-obstructive CAD 2. Severe NICM  with EF 25% by echo 3. Cardiogenic shock physiology with markedly elevated biventricular filling pressures Lexiscan 09/23/13: EF 50% normal perfusion Holter: SR occasional PVCs  PMH: 1. COPD: PFTs (1/14) with FEV1 68%.  Quit smoking 9/14.  2. Type II diabetes  3. HTN 4. Breast cancer: 2009, s/p Adriamycin/Cytoxan/Taxol chemo, lumpectomy and radiation.   5. Nonischemic cardiomyopathy: Possibly due to Adriamycin.  Echo (8/14) with EF 20-25%, global hypokinesis, moderate diastolic dysfunction, normal RV size and with mild to moderately decreased systolic function, moderate TR.   6. CAD: LHC (8/14) with nonobstructive CAD; 40-50% mLAD, 50-70% LCx, 50% PDA.  7. Hyperlipidemia  SH: Lives alone in [place].  Mother is next door.  Still smoking.   FH: No premature CAD.  No family history of cardiomyopathy.   Review of systems complete and found to be negative unless listed in HPI.    Current Outpatient Medications  Medication Sig Dispense Refill  . albuterol (PROVENTIL HFA;VENTOLIN HFA) 108 (90 BASE) MCG/ACT inhaler Inhale 2 puffs into the lungs every 6 (six) hours as needed for wheezing or shortness of breath. 1 Inhaler 0  . aspirin EC 81 MG tablet Take 1 tablet (81 mg total) by mouth daily. 90 tablet 3  . atorvastatin (LIPITOR) 20 MG tablet TAKE ONE TABLET BY MOUTH ONCE DAILY 90 tablet 2  . Calcium 500-125 MG-UNIT TABS Take by mouth.    . carvedilol (COREG) 6.25 MG tablet Take 1.5 tablets (9.375 mg total) by mouth  2 (two) times daily. 270 tablet 3  . cyclobenzaprine (FLEXERIL) 10 MG tablet Take 1 tablet (10 mg total) by mouth 3 (three) times daily as needed for muscle spasms. 30 tablet 0  . ENTRESTO 97-103 MG TAKE ONE TABLET BY MOUTH TWICE DAILY 180 tablet 3  . fluconazole (DIFLUCAN) 150 MG tablet Take 150 mg by mouth daily.    . furosemide (LASIX) 40 MG tablet TAKE 1 TABLET BY MOUTH ONCE DAILY TAKE  EXTRA  40  MG  TABLET  ONCE  DAILY AS NEEDED FOR  WEIGHT  GAIN  3  POUNDS  OR  MORE   OVERNIGHT 120 tablet 3  . glipiZIDE (GLUCOTROL) 10 MG tablet Take 10 mg in am and 10 mg before dinner 180 tablet 1  . hydrOXYzine (ATARAX/VISTARIL) 50 MG tablet Take 1-2 tablets (50-100 mg total) by mouth 3 (three) times daily as needed for itching. 90 tablet 3  . hydrOXYzine (VISTARIL) 100 MG capsule Take 1 capsule (100 mg total) by mouth at bedtime as needed. 30 capsule 1  . letrozole (FEMARA) 2.5 MG tablet TAKE ONE TABLET BY MOUTH IN THE MORNING 90 tablet 2  . LORazepam (ATIVAN) 2 MG tablet Take 1 tablet (2 mg total) by mouth at bedtime as needed for anxiety. 90 tablet 1  . metFORMIN (GLUCOPHAGE) 1000 MG tablet Take 1 tablet (1,000 mg total) by mouth 2 (two) times daily with a meal. 180 tablet 1  . ofloxacin (OCUFLOX) 0.3 % ophthalmic solution   0  . omeprazole (PRILOSEC) 20 MG capsule Take by mouth.    . spironolactone (ALDACTONE) 25 MG tablet Take 12.5 mg by mouth daily.    Marland Kitchen spironolactone (ALDACTONE) 25 MG tablet Take 0.5 tablets (12.5 mg total) by mouth daily. 45 tablet 6   No current facility-administered medications for this encounter.     Vitals:   07/04/17 1339  BP: (!) 86/66  Pulse: 94  SpO2: 98%  Weight: 159 lb 6.4 oz (72.3 kg)   Wt Readings from Last 3 Encounters:  07/04/17 159 lb 6.4 oz (72.3 kg)  06/14/17 162 lb (73.5 kg)  04/03/17 156 lb (70.8 kg)    General:  Well appearing. No resp difficulty. Waked in clinic without difficutly HEENT: normal Neck: supple. no JVD. Carotids 2+ bilat; no bruits. No lymphadenopathy or thryomegaly appreciated. Cor: PMI nondisplaced. Regular rate & rhythm. No rubs, gallops or murmurs. Lungs: clear Abdomen: soft, nontender, nondistended. No hepatosplenomegaly. No bruits or masses. Good bowel sounds. Extremities: no cyanosis, clubbing, rash, edema Neuro: alert & orientedx3, cranial nerves grossly intact. moves all 4 extremities w/o difficulty. Affect pleasant   Assessment/Plan:  1. Chronic systolic CHF: NICM, likely Adriamycin  toxicity, - Recent ECHO 02/2017 EF 40-45%. -NYHA II. Volume status stable. Continue lasix 40 mg daily.  -Continue Entresto 97/103.  -Continue spiro 12.5 mg daily. Unable to tolerate 25 daily due to hyperK -Continue lasix 40 daily.  - Continue carvedilol to 9.375 BID -Check BMET  2. CAD: Moderate nonobstructive disease. -Continue ASA and statin - No s/s ischemia  3. COPD/tobacco use -Smoking. Discussed smoking cessation. 4. Cocaine use - Abstinent 5. Breast Cancer: - Previously received adriamycin. Cancer free for > 5 years. No change. 6. Carotid stenosis:  - Asymptomatic.Due for u/s. 7. R chest leiomyosarcoma: - Had further resections and margins now clear. Sees Dr. Alen Blew and Dr. Barry Dienes. No change.   Follow up in 6 months with an ECHO and Dr Haroldine Laws.    Darrick Grinder, NP  1:53 PM

## 2017-07-04 NOTE — Patient Instructions (Signed)
Routine lab work today. Will notify you of abnormal results, otherwise no news is good news!  Follow up 6 months with Dr. Haroldine Laws and echocardiogram. We will call you closer to this time, or you may call our office to schedule 1 month before you are due to be seen. Take all medication as prescribed the day of your appointment. Bring all medications with you to your appointment.  Do the following things EVERYDAY: 1) Weigh yourself in the morning before breakfast. Write it down and keep it in a log. 2) Take your medicines as prescribed 3) Eat low salt foods-Limit salt (sodium) to 2000 mg per day.  4) Stay as active as you can everyday 5) Limit all fluids for the day to less than 2 liters

## 2017-07-05 ENCOUNTER — Telehealth (HOSPITAL_COMMUNITY): Payer: Self-pay

## 2017-07-05 MED ORDER — FUROSEMIDE 20 MG PO TABS: ORAL_TABLET | ORAL | 3 refills | Status: DC

## 2017-07-05 NOTE — Telephone Encounter (Signed)
Result Notes for Basic metabolic panel   Notes recorded by Effie Berkshire, RN on 07/05/2017 at 2:58 PM EDT Patient aware and agreeable, Rx updated to preferred pharmacy and medication list updated in chart ------  Notes recorded by Darrick Grinder D, NP on 07/04/2017 at 2:55 PM EDT Please call ask her to hold lasix for 2 days then restart lasix 20 mg daily.

## 2017-07-16 ENCOUNTER — Emergency Department (HOSPITAL_COMMUNITY)
Admission: EM | Admit: 2017-07-16 | Discharge: 2017-07-16 | Disposition: A | Discharge status: Home / Self Care | Payer: Medicare Other | Attending: Emergency Medicine | Admitting: Emergency Medicine

## 2017-07-16 ENCOUNTER — Encounter (HOSPITAL_COMMUNITY): Payer: Self-pay | Admitting: Emergency Medicine

## 2017-07-16 ENCOUNTER — Ambulatory Visit: Payer: Medicare Other | Admitting: Family Medicine

## 2017-07-16 DIAGNOSIS — Z7984 Long term (current) use of oral hypoglycemic drugs: Secondary | ICD-10-CM | POA: Insufficient documentation

## 2017-07-16 DIAGNOSIS — Z79899 Other long term (current) drug therapy: Secondary | ICD-10-CM | POA: Diagnosis not present

## 2017-07-16 DIAGNOSIS — I5022 Chronic systolic (congestive) heart failure: Secondary | ICD-10-CM | POA: Diagnosis not present

## 2017-07-16 DIAGNOSIS — E785 Hyperlipidemia, unspecified: Secondary | ICD-10-CM | POA: Insufficient documentation

## 2017-07-16 DIAGNOSIS — K859 Acute pancreatitis without necrosis or infection, unspecified: Secondary | ICD-10-CM | POA: Insufficient documentation

## 2017-07-16 DIAGNOSIS — Z853 Personal history of malignant neoplasm of breast: Secondary | ICD-10-CM | POA: Insufficient documentation

## 2017-07-16 DIAGNOSIS — R1084 Generalized abdominal pain: Secondary | ICD-10-CM

## 2017-07-16 DIAGNOSIS — E1140 Type 2 diabetes mellitus with diabetic neuropathy, unspecified: Secondary | ICD-10-CM | POA: Diagnosis not present

## 2017-07-16 DIAGNOSIS — Z7982 Long term (current) use of aspirin: Secondary | ICD-10-CM | POA: Diagnosis not present

## 2017-07-16 DIAGNOSIS — Z87891 Personal history of nicotine dependence: Secondary | ICD-10-CM | POA: Diagnosis not present

## 2017-07-16 DIAGNOSIS — I110 Hypertensive heart disease with heart failure: Secondary | ICD-10-CM | POA: Diagnosis not present

## 2017-07-16 LAB — CBC
HEMATOCRIT: 39.8 % (ref 36.0–46.0)
Hemoglobin: 13.3 g/dL (ref 12.0–15.0)
MCH: 28.2 pg (ref 26.0–34.0)
MCHC: 33.4 g/dL (ref 30.0–36.0)
MCV: 84.5 fL (ref 78.0–100.0)
PLATELETS: 223 10*3/uL (ref 150–400)
RBC: 4.71 MIL/uL (ref 3.87–5.11)
RDW: 12.4 % (ref 11.5–15.5)
WBC: 12.2 10*3/uL — ABNORMAL HIGH (ref 4.0–10.5)

## 2017-07-16 LAB — COMPREHENSIVE METABOLIC PANEL
ALBUMIN: 3.9 g/dL (ref 3.5–5.0)
ALK PHOS: 63 U/L (ref 38–126)
ALT: 15 U/L (ref 14–54)
AST: 16 U/L (ref 15–41)
Anion gap: 13 (ref 5–15)
BUN: 20 mg/dL (ref 6–20)
CALCIUM: 9.4 mg/dL (ref 8.9–10.3)
CO2: 23 mmol/L (ref 22–32)
CREATININE: 1.31 mg/dL — AB (ref 0.44–1.00)
Chloride: 100 mmol/L — ABNORMAL LOW (ref 101–111)
GFR calc Af Amer: 51 mL/min — ABNORMAL LOW (ref >60)
GFR, EST NON AFRICAN AMERICAN: 44 mL/min — AB (ref >60)
GLUCOSE: 230 mg/dL — AB (ref 65–99)
Potassium: 4 mmol/L (ref 3.5–5.1)
Sodium: 136 mmol/L (ref 135–145)
TOTAL PROTEIN: 7.2 g/dL (ref 6.5–8.1)
Total Bilirubin: 0.7 mg/dL (ref 0.3–1.2)

## 2017-07-16 LAB — URINALYSIS, ROUTINE W REFLEX MICROSCOPIC
BILIRUBIN URINE: NEGATIVE
Bacteria, UA: NONE SEEN
GLUCOSE, UA: NEGATIVE mg/dL
Ketones, ur: NEGATIVE mg/dL
Leukocytes, UA: NEGATIVE
NITRITE: NEGATIVE
PH: 5 (ref 5.0–8.0)
Protein, ur: NEGATIVE mg/dL
SPECIFIC GRAVITY, URINE: 1.009 (ref 1.005–1.030)

## 2017-07-16 LAB — I-STAT BETA HCG BLOOD, ED (MC, WL, AP ONLY): I-stat hCG, quantitative: <5 m[IU]/mL (ref <5)

## 2017-07-16 LAB — LIPASE, BLOOD: Lipase: 117 U/L — ABNORMAL HIGH (ref 11–51)

## 2017-07-16 MED ORDER — ONDANSETRON 4 MG PO TBDP: 4.0000 mg | ORAL_TABLET | Freq: Three times a day (TID) | ORAL | 0 refills | Status: DC | PRN

## 2017-07-16 MED ORDER — HYDROMORPHONE HCL 2 MG/ML IJ SOLN
1.0000 mg | Freq: Once | INTRAMUSCULAR | Status: AC
  Administered 2017-07-16: 1 mg via INTRAMUSCULAR
  Filled 2017-07-16: qty 1

## 2017-07-16 MED ORDER — OXYCODONE-ACETAMINOPHEN 5-325 MG PO TABS: 2.0000 | ORAL_TABLET | ORAL | 0 refills | Status: DC | PRN

## 2017-07-16 MED ORDER — ONDANSETRON 4 MG PO TBDP
4.0000 mg | ORAL_TABLET | Freq: Once | ORAL | Status: AC
  Administered 2017-07-16: 4 mg via ORAL
  Filled 2017-07-16: qty 1
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
## 2017-07-16 NOTE — Discharge Instructions (Signed)
See your Physician for recheck in 2-3 days  

## 2017-07-16 NOTE — ED Triage Notes (Signed)
Patient presents to ED for assessment of lower and mid back pain x 1 week, with a recent injury, but states pain had gotten better.  No experiencing pain to her upper right abdomen, with nausea, constipation, and pressure before needing to urinate.  Patient states hx of pancreatitis, but also told recent kidney issues at Dr. Zoila Shutter.

## 2017-07-17 ENCOUNTER — Telehealth: Payer: Self-pay | Admitting: *Deleted

## 2017-07-17 NOTE — Telephone Encounter (Signed)
Pharmacy called related to Rx: Percocet dosing .Marland KitchenMarland KitchenEDCM clarified with EDP (Hedges) to change Rx to: take 1 tablet every 4 hours as needs for pain.

## 2017-07-20 ENCOUNTER — Ambulatory Visit (INDEPENDENT_AMBULATORY_CARE_PROVIDER_SITE_OTHER): Payer: Medicare Other | Admitting: Family Medicine

## 2017-07-20 ENCOUNTER — Encounter: Payer: Self-pay | Admitting: Family Medicine

## 2017-07-20 ENCOUNTER — Other Ambulatory Visit (HOSPITAL_COMMUNITY): Payer: Self-pay | Admitting: Internal Medicine

## 2017-07-20 VITALS — BP 90/50 | HR 87 | Temp 97.5°F | Ht 65.0 in | Wt 158.8 lb

## 2017-07-20 DIAGNOSIS — K859 Acute pancreatitis without necrosis or infection, unspecified: Secondary | ICD-10-CM

## 2017-07-20 DIAGNOSIS — I959 Hypotension, unspecified: Secondary | ICD-10-CM

## 2017-07-20 DIAGNOSIS — E785 Hyperlipidemia, unspecified: Secondary | ICD-10-CM

## 2017-07-20 DIAGNOSIS — R42 Dizziness and giddiness: Secondary | ICD-10-CM | POA: Diagnosis not present

## 2017-07-20 MED ORDER — CARVEDILOL 6.25 MG PO TABS: 6.2500 mg | ORAL_TABLET | Freq: Two times a day (BID) | ORAL | 3 refills | Status: DC

## 2017-07-20 MED ORDER — OXYCODONE-ACETAMINOPHEN 5-325 MG PO TABS: 2.0000 | ORAL_TABLET | ORAL | 0 refills | Status: DC | PRN

## 2017-07-20 NOTE — Progress Notes (Signed)
    Subjective:  Emily [name] is a 57 y.o. female who presents today with a chief complaint of ED follow up for pancreatitis.   HPI:  Pancreatitis, New problem Patient presented to the ED 4 days ago with 2 days of severe epigastric pain that radiated to her back.  Work-up in the ED revealed elevated lipase.  Patient was diagnosed with pancreatitis and discharged home with prescription for Percocet and Zofran.  Patient notes this is her third bout of pancreatitis.  She did not have any clear reasons for her past bouts of pancreatitis.  Thinks it may have been related to her gallbladder.  Denies any alcohol use preceding the most recent attack.  She has had a small amount of nausea and vomiting which have improved a little bit.  She takes Percocet and Zofran which helped with her pain and nausea respectively.  No other obvious alleviating or aggravating factors.  Dizziness, new problem Worsened over the last few days to weeks.  Worse when standing up and arising from a seated position.  No reported vertiginous symptoms.  No reported weakness or numbness.  No reported syncopal episodes.  ROS: Per HPI  PMH: She reports that she quit smoking about 4 months ago. Her smoking use included cigarettes. She has a 40.00 pack-year smoking history. She has never used smokeless tobacco. She reports that she drinks about 3.6 oz of alcohol per week. She reports that she does not use drugs.  Objective:  Physical Exam: BP (!) 90/50 (BP Location: Left Arm, Patient Position: Sitting, Cuff Size: Normal)   Pulse 87   Temp (!) 97.5 F (36.4 C) (Oral)   Ht 5\' 5"  (1.651 m)   Wt 158 lb 12.8 oz (72 kg)   SpO2 91%   BMI 26.43 kg/m    Orthostatic VS for the past 24 hrs (Last 3 readings):  BP- Lying Pulse- Lying BP- Sitting Pulse- Sitting BP- Standing at 0 minutes Pulse- Standing at 0 minutes  07/20/17 1345 - 77 - 81 - 90  07/20/17 1340 (!) 80/60 - (!) 86/66 - 90/70 -  Gen: NAD, resting comfortably CV: RRR  with no murmurs appreciated Pulm: NWOB, CTAB with no crackles, wheezes, or rhonchi GI: Normal bowel sounds present. Soft, Nontender, Nondistended. MSK: No edema, cyanosis, or clubbing noted Skin: Warm, dry Neuro: Grossly normal, moves all extremities Psych: Normal affect and thought content  Assessment/Plan:  Pancreatitis Stable. Refilled percocet.  Database reviewed without red flags.  Discussed possible etiologies and need to avoid alcohol.  If continues to have recurrent symptoms over signs of chronic pancreatitis, will need referral to GI.  Return precautions reviewed.  Dizziness/hypotension Patient with positive orthostatic vital signs here.  Likely secondary to antihypertensives as well as dehydration related to her recent bout of pancreatitis.  We will decrease her dose of Coreg to 6.25 mg twice daily.  Encouraged good oral hydration.  She will follow-up with me in a few weeks, or sooner as needed.  Discussed reasons to return to care.  [name]. Jerline Pain, MD 07/20/2017 2:37 PM

## 2017-07-20 NOTE — Patient Instructions (Signed)
Please cut your coreg back to 1 pill twice daily. I think that your blood pressure is too low and that is causing you to be dizzy.  Please use the zofran and percocet as needed. We may need to get you in to see a GI specialist if your symptoms do not improve.  Come back to see me in 4 weeks to discuss your diabetes and other medical conditions, or sooner as needed.   Take care, Dr Jerline Pain

## 2017-07-25 ENCOUNTER — Inpatient Hospital Stay: Payer: Medicare Other | Admitting: Family Medicine

## 2017-07-26 ENCOUNTER — Telehealth (HOSPITAL_COMMUNITY): Payer: Self-pay | Admitting: *Deleted

## 2017-07-26 NOTE — Telephone Encounter (Signed)
Patient was using PAN foundation for Johnnye Sima is still closed and patient is unable to afford medication.  She is coming to office tomorrow to sign [employer] Patient Assistance application and bringing copies of her monthly income.  She still has 1 week supply left of medication at home, will supply another week of samples until application is approved.   Samples and application left at front desk.      Medication Samples have been provided to the patient.  Drug name: Delene Loll     Strength: 49-51mg       Qty: 4 LOT: [id number]  Exp.Date: 1/21  Dosing instructions: Take 2 Tablets Twice Daily to equal the prescribed dose of 97-103 mg.   The patient has been instructed regarding the correct time, dose, and frequency of taking this medication, including desired effects and most common side effects.   Darron Doom 4:17 PM 07/26/2017

## 2017-07-27 ENCOUNTER — Other Ambulatory Visit (HOSPITAL_COMMUNITY): Payer: Self-pay | Admitting: *Deleted

## 2017-07-27 MED ORDER — SACUBITRIL-VALSARTAN 97-103 MG PO TABS: 1.0000 | ORAL_TABLET | Freq: Two times a day (BID) | ORAL | 11 refills | Status: DC

## 2017-07-27 MED ORDER — SACUBITRIL-VALSARTAN 97-103 MG PO TABS
1.0000 | ORAL_TABLET | Freq: Two times a day (BID) | ORAL | 11 refills | Status: DC
Start: 2017-07-27 — End: 2018-02-04

## 2017-07-27 NOTE — Telephone Encounter (Signed)
Patient came to clinic today and signed application.  I have faxed application to [employer] @ [phone].  Will wait for approval.

## 2017-08-02 ENCOUNTER — Ambulatory Visit: Payer: Medicare Other | Admitting: Family Medicine

## 2017-08-08 ENCOUNTER — Encounter (HOSPITAL_COMMUNITY): Payer: Self-pay

## 2017-08-08 NOTE — Progress Notes (Signed)
[employer] Patient Assistance Foundation recommends patient reachign out to [employer] LIS for alternate funding source for [employer]. Letter mailed to patient with contact information and instructions.  Renee Pain, RN

## 2017-08-20 ENCOUNTER — Encounter: Payer: Self-pay | Admitting: Family Medicine

## 2017-08-20 ENCOUNTER — Ambulatory Visit (INDEPENDENT_AMBULATORY_CARE_PROVIDER_SITE_OTHER): Payer: Medicare Other | Admitting: Family Medicine

## 2017-08-20 VITALS — BP 104/60 | HR 115 | Temp 98.7°F | Ht 65.0 in | Wt 159.4 lb

## 2017-08-20 DIAGNOSIS — E1165 Type 2 diabetes mellitus with hyperglycemia: Secondary | ICD-10-CM

## 2017-08-20 DIAGNOSIS — I10 Essential (primary) hypertension: Secondary | ICD-10-CM | POA: Insufficient documentation

## 2017-08-20 DIAGNOSIS — M549 Dorsalgia, unspecified: Secondary | ICD-10-CM | POA: Diagnosis not present

## 2017-08-20 DIAGNOSIS — E785 Hyperlipidemia, unspecified: Secondary | ICD-10-CM

## 2017-08-20 DIAGNOSIS — M545 Low back pain, unspecified: Secondary | ICD-10-CM | POA: Insufficient documentation

## 2017-08-20 DIAGNOSIS — E1149 Type 2 diabetes mellitus with other diabetic neurological complication: Secondary | ICD-10-CM | POA: Diagnosis not present

## 2017-08-20 DIAGNOSIS — I152 Hypertension secondary to endocrine disorders: Secondary | ICD-10-CM

## 2017-08-20 DIAGNOSIS — F321 Major depressive disorder, single episode, moderate: Secondary | ICD-10-CM | POA: Diagnosis not present

## 2017-08-20 DIAGNOSIS — E1169 Type 2 diabetes mellitus with other specified complication: Secondary | ICD-10-CM | POA: Diagnosis not present

## 2017-08-20 DIAGNOSIS — K861 Other chronic pancreatitis: Secondary | ICD-10-CM | POA: Diagnosis not present

## 2017-08-20 DIAGNOSIS — E1159 Type 2 diabetes mellitus with other circulatory complications: Secondary | ICD-10-CM

## 2017-08-20 DIAGNOSIS — G8929 Other chronic pain: Secondary | ICD-10-CM | POA: Diagnosis not present

## 2017-08-20 LAB — POCT GLYCOSYLATED HEMOGLOBIN (HGB A1C): HEMOGLOBIN A1C: 7.7 % — AB (ref 4.0–5.6)

## 2017-08-20 MED ORDER — GLUCOSE BLOOD VI STRP: ORAL_STRIP | 11 refills | Status: DC

## 2017-08-20 NOTE — Assessment & Plan Note (Addendum)
No red flags or symptoms.  She will be following up with orthopedics for further evaluation and management.

## 2017-08-20 NOTE — Assessment & Plan Note (Signed)
Symptoms are stable, however this is likely the source of her abdominal bloating.  We will continue with watchful waiting.  If continues to have bloating and recurrent bouts of pancreatitis, will need referral to GI.  Patient interested in testing for celiac disease-this was deferred today until she recovers fully from her episode of pancreatitis.

## 2017-08-20 NOTE — Progress Notes (Signed)
Subjective:  Emily [name] is a 57 y.o. female who presents today with a chief complaint of T2DM.   HPI:  Type 2 diabetes, established problem, stable Patient currently on metformin thousand milligrams twice daily and Glucotrol 10 mg daily.  Compliant with both these medications without any reported side effects.  Reports having dietary indiscretions recently including a lot of ice cream.  Hypertension/CAD/HFpEF, established problems, stable Patient currently on Entresto 97-103, atorvastatin 20 mg daily, Coreg 6.25 mg twice daily, Lasix 20 mg daily, and spironolactone 12.5 mg daily.  Tolerates these well without side effects.  Chronic back pain, stable Pain is been stable.  She is planning on seeing an orthopedist soon for further evaluation management.  Abdominal bloating/pancreatitis, chronic problems, stable Patient seen a little over a month ago for pancreatitis.  Her pain has significantly improved, however she still has significant bloating with eating.  She is concerned about possible celiac disease.  Depression, established problem, Stable Patient weaned herself off all of her antidepressants.  She has done well.  Does not wish to restart any medication today.  Depression screen PHQ 2/9 08/20/2017  Decreased Interest 1  Down, Depressed, Hopeless 1  PHQ - 2 Score 2  Altered sleeping 1  Tired, decreased energy 1  Change in appetite 0  Feeling bad or failure about yourself  0  Trouble concentrating 0  Moving slowly or fidgety/restless 0  Suicidal thoughts 0  PHQ-9 Score 4  Difficult doing work/chores Somewhat difficult  Some recent data might be hidden   ROS: Per HPI  PMH: She reports that she quit smoking about 5 months ago. Her smoking use included cigarettes. She has a 40.00 pack-year smoking history. She has never used smokeless tobacco. She reports that she drinks about 3.6 oz of alcohol per week. She reports that she does not use drugs.  Objective:  Physical  Exam: BP 104/60 (BP Location: Left Arm, Patient Position: Sitting, Cuff Size: Normal)   Pulse (!) 115   Temp 98.7 F (37.1 C) (Oral)   Ht 5\' 5"  (1.651 m)   Wt 159 lb 6.4 oz (72.3 kg)   SpO2 97%   BMI 26.53 kg/m   Wt Readings from Last 3 Encounters:  08/20/17 159 lb 6.4 oz (72.3 kg)  07/20/17 158 lb 12.8 oz (72 kg)  07/04/17 159 lb 6.4 oz (72.3 kg)  Gen: NAD, resting comfortably CV: RRR with no murmurs appreciated Pulm: NWOB, CTAB with no crackles, wheezes, or rhonchi GI: Normal bowel sounds present. Soft, Nontender, Nondistended.  Results for orders placed or performed in visit on 08/20/17 (from the past 24 hour(s))  POCT glycosylated hemoglobin (Hb A1C)     Status: Abnormal   Collection Time: 08/20/17  4:35 PM  Result Value Ref Range   Hemoglobin A1C 7.7 (A) 4.0 - 5.6 %   HbA1c POC (<> result, manual entry)  4.0 - 5.6 %   HbA1c, POC (prediabetic range)  5.7 - 6.4 %   HbA1c, POC (controlled diabetic range)  0.0 - 7.0 %     Assessment/Plan:  Type 2 diabetes mellitus with neurological complications (HCC) S9G improved to 7.7.  Continue metformin 1000 mg twice daily and glipizide 10 mg daily.  Discussed lifestyle modifications.  She will follow-up with me in 3 to 6 months.  Advised patient she is due for her annual eye exam.  Moderate single current episode of major depressive disorder (HCC) PHQ 9 score of 4 today.  Her symptoms are stable and she  does not wish to pursue further treatment at this time.  Discussed reasons to return to care.  Chronic back pain No red flags or symptoms.  She will be following up with orthopedics for further evaluation and management.  Dyslipidemia associated with type 2 diabetes mellitus (HCC) Stable on Lipitor 20 mg daily.  We will continue this.  Chronic recurrent pancreatitis ([place]) Symptoms are stable, however this is likely the source of her abdominal bloating.  We will continue with watchful waiting.  If continues to have bloating and  recurrent bouts of pancreatitis, will need referral to GI.  Patient interested in testing for celiac disease-this was deferred today until she recovers fully from her episode of pancreatitis.  Hypertension associated with diabetes ([place]) At goal.  Continue Entresto, spironolactone, Lasix, and Coreg.  [name]. Jerline Pain, MD 08/20/2017 4:45 PM

## 2017-08-20 NOTE — Assessment & Plan Note (Signed)
Stable on Lipitor 20 mg daily.  We will continue this.

## 2017-08-20 NOTE — Patient Instructions (Signed)
It was very nice to see you today!  Your A1c looks much better today.  Please keep up the good work.  We will not make any other medication changes today.  We can check you for celiac disease in the future if your symptoms do not improve.  Come back to see me in 3 to 6 months, or sooner as needed  Take care, Dr Jerline Pain

## 2017-08-20 NOTE — Assessment & Plan Note (Signed)
PHQ 9 score of 4 today.  Her symptoms are stable and she does not wish to pursue further treatment at this time.  Discussed reasons to return to care.

## 2017-08-20 NOTE — Assessment & Plan Note (Signed)
A1c improved to 7.7.  Continue metformin 1000 mg twice daily and glipizide 10 mg daily.  Discussed lifestyle modifications.  She will follow-up with me in 3 to 6 months.  Advised patient she is due for her annual eye exam.

## 2017-08-20 NOTE — Assessment & Plan Note (Signed)
At goal.  Continue Entresto, spironolactone, Lasix, and Coreg.

## 2017-09-12 ENCOUNTER — Other Ambulatory Visit: Payer: Self-pay | Admitting: Oncology

## 2017-09-12 DIAGNOSIS — Z853 Personal history of malignant neoplasm of breast: Secondary | ICD-10-CM

## 2017-09-17 ENCOUNTER — Other Ambulatory Visit: Payer: Self-pay | Admitting: Oncology

## 2017-09-17 ENCOUNTER — Other Ambulatory Visit: Payer: Self-pay | Admitting: Family Medicine

## 2017-09-17 DIAGNOSIS — Z1231 Encounter for screening mammogram for malignant neoplasm of breast: Secondary | ICD-10-CM

## 2017-09-25 ENCOUNTER — Other Ambulatory Visit: Payer: Self-pay | Admitting: Oncology

## 2017-09-25 ENCOUNTER — Other Ambulatory Visit: Payer: Self-pay | Admitting: Family Medicine

## 2017-09-26 NOTE — Telephone Encounter (Signed)
Please advise 

## 2017-10-27 IMAGING — DX DG CHEST 2V
2 series · 2 of 2 positions shown · non-contrast
Comparison: December 19, 2012

CLINICAL DATA: Hoarseness and cough

EXAM:
CHEST  2 VIEW

[chest pa]
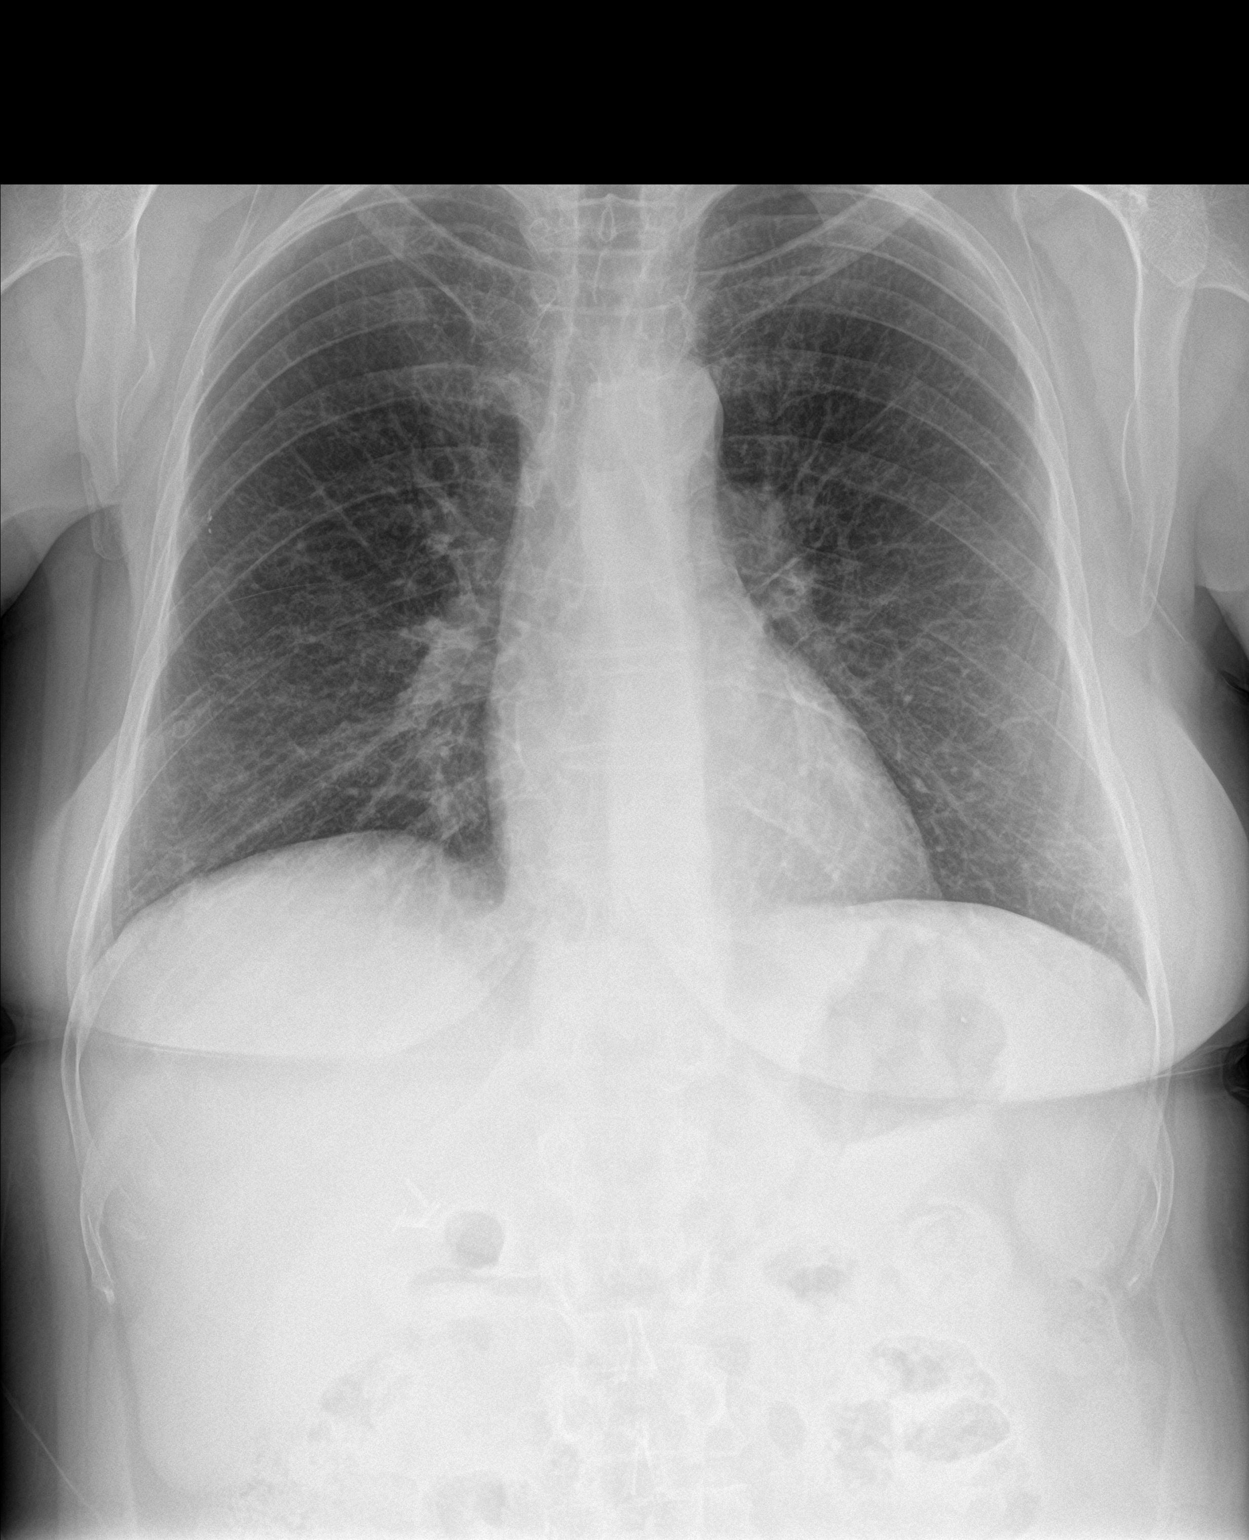

[chest lat]
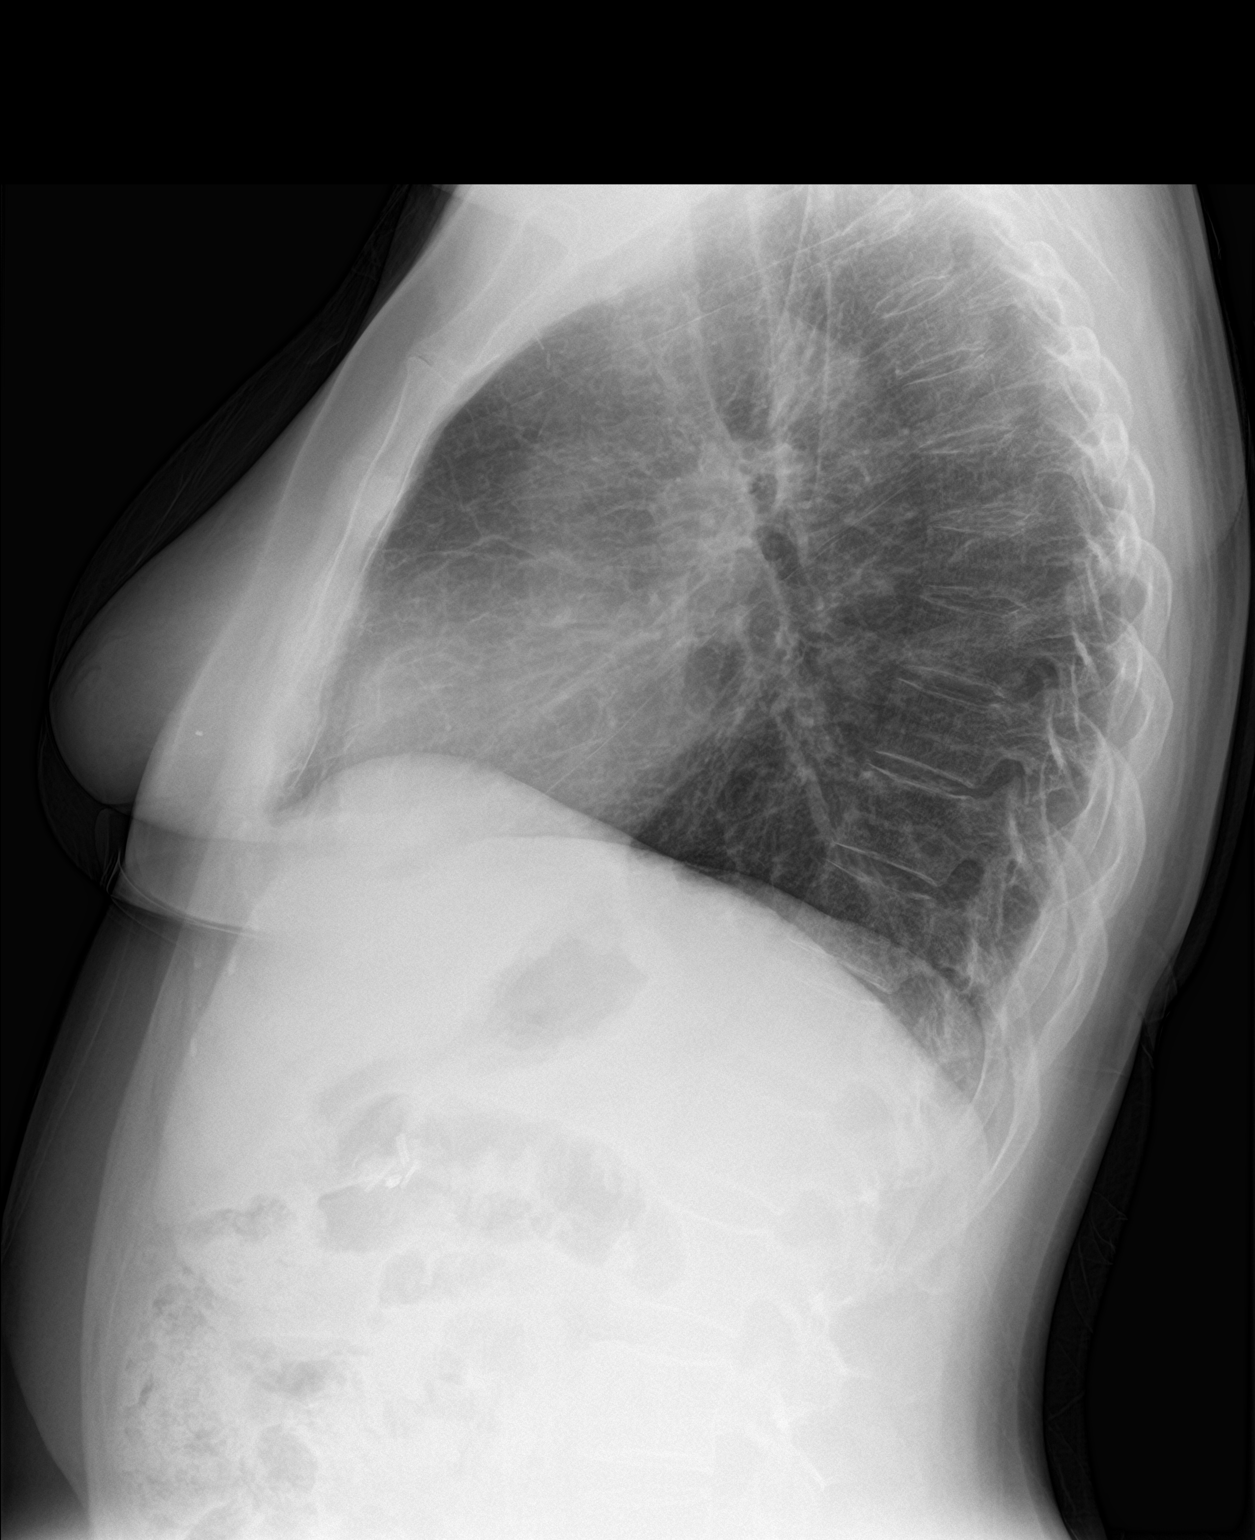

[2 of 2 positions shown; findings below may reference images not displayed]

FINDINGS: There is apparent interstitial edema in the mid and lower lung zones
bilaterally, mild. Lungs elsewhere clear. Heart size and pulmonary
vascularity normal. No adenopathy. There is atherosclerotic
calcification in the aorta. No evident bone lesions.
IMPRESSION: Mild interstitial edema in the mid lower lung zones. Question
noncardiogenic edema. An allergic type phenomenon could present in
this manner. There is no airspace consolidation. Heart size and
pulmonary vascularity are normal. No adenopathy. There is aortic
atherosclerosis.

## 2017-10-31 ENCOUNTER — Ambulatory Visit: Payer: Self-pay

## 2017-10-31 ENCOUNTER — Other Ambulatory Visit: Payer: Self-pay

## 2017-11-05 ENCOUNTER — Telehealth (HOSPITAL_COMMUNITY): Payer: Self-pay | Admitting: Pharmacist

## 2017-11-05 ENCOUNTER — Encounter (HOSPITAL_COMMUNITY): Payer: Self-pay | Admitting: Pharmacist

## 2017-11-05 NOTE — Telephone Encounter (Signed)
Emily [name] was denied assistance for Entresto through [employer] since she has Medicare. She already has a LIS through Medicare so will send a letter of hardship to [employer] to appeal the decision.   [name]. Velva Harman, PharmD, BCPS, CPP Clinical Pharmacist Phone: [phone] 11/05/2017 1:53 PM

## 2017-11-14 LAB — HM DIABETES EYE EXAM

## 2017-11-16 ENCOUNTER — Encounter: Payer: Self-pay | Admitting: Family Medicine

## 2017-11-19 ENCOUNTER — Telehealth (HOSPITAL_COMMUNITY): Payer: Self-pay | Admitting: Pharmacist

## 2017-11-19 NOTE — Telephone Encounter (Signed)
[employer] patient assistance approved for Entresto 97-103 mg BID through 02/26/18.   [name]. Velva Harman, PharmD, BCPS, CPP Clinical Pharmacist Phone: [phone] 11/19/2017 1:47 PM

## 2017-11-20 ENCOUNTER — Ambulatory Visit (INDEPENDENT_AMBULATORY_CARE_PROVIDER_SITE_OTHER): Payer: Medicare Other | Admitting: Family Medicine

## 2017-11-20 ENCOUNTER — Encounter: Payer: Self-pay | Admitting: Family Medicine

## 2017-11-20 VITALS — BP 120/62 | HR 100 | Temp 98.1°F | Ht 65.0 in | Wt 164.8 lb

## 2017-11-20 DIAGNOSIS — Z23 Encounter for immunization: Secondary | ICD-10-CM

## 2017-11-20 DIAGNOSIS — E1149 Type 2 diabetes mellitus with other diabetic neurological complication: Secondary | ICD-10-CM | POA: Diagnosis not present

## 2017-11-20 DIAGNOSIS — M899 Disorder of bone, unspecified: Secondary | ICD-10-CM

## 2017-11-20 DIAGNOSIS — C499 Malignant neoplasm of connective and soft tissue, unspecified: Secondary | ICD-10-CM | POA: Diagnosis not present

## 2017-11-20 DIAGNOSIS — F321 Major depressive disorder, single episode, moderate: Secondary | ICD-10-CM | POA: Diagnosis not present

## 2017-11-20 DIAGNOSIS — M898X8 Other specified disorders of bone, other site: Secondary | ICD-10-CM

## 2017-11-20 LAB — POCT GLYCOSYLATED HEMOGLOBIN (HGB A1C): HEMOGLOBIN A1C: 7.2 % — AB (ref 4.0–5.6)

## 2017-11-20 MED ORDER — CITALOPRAM HYDROBROMIDE 20 MG PO TABS: 40.0000 mg | ORAL_TABLET | Freq: Every day | ORAL | 3 refills | Status: DC

## 2017-11-20 NOTE — Assessment & Plan Note (Signed)
PHQ score significantly elevated.  Will start Celexa 20 mg daily.  She will increase to 40 mg daily if tolerating well.  Follow-up with me in 3 months.  Also discussed referral to behavioral therapy.  Patient will look into this.  Would consider trial of Trintellix if symptoms are not improving with Celexa.  May need referral back to psychiatry if symptoms are not able to be controlled.

## 2017-11-20 NOTE — Patient Instructions (Signed)
It was very nice to see you today!  Please start the celexa. Take 1 pill a day for 1-2 weeks, then increase to 2 pills daily.  Please schedule an appointment with Lattie Haw if you are interested.  No other changes today.  Keep up the good work! Your blood sugar numbers are looking much better.  I will see you back in 3 months, or sooner as needed.  Take care, Dr Jerline Pain

## 2017-11-20 NOTE — Progress Notes (Signed)
Subjective:  Emily [name] is a 57 y.o. female who presents today with a chief complaint of T2DM.   HPI:  T2DM, chronic problem Currently on metformin 1000mg  twice daily and glipizide 10mg .  Tolerating both these well without side effects.  No reported polyuria polydipsia.  She reports several dietary indiscretions recently related to increased stress levels.  Depression/Anxiety, established problems, worsening Several year history.  Has been on several medications in the past including amitriptyline, Zoloft, Zoloft, Effexor, Wellbutrin, and lamotrigine.  Currently takes Ativan as needed which helps some however still has a significant amount of depression and anxiety.  She would like to start an additional medication today.  Depression screen PHQ 2/9 11/20/2017  Decreased Interest 2  Down, Depressed, Hopeless 2  PHQ - 2 Score 4  Altered sleeping 2  Tired, decreased energy 2  Change in appetite 3  Feeling bad or failure about yourself  3  Trouble concentrating 0  Moving slowly or fidgety/restless 2  Suicidal thoughts 1  PHQ-9 Score 17  Difficult doing work/chores Somewhat difficult  Some recent data might be hidden   Chest Lump First noticed within the past few months.  Located near the area where she had a sarcoma in the past.  No pain.  Seems to be growing in size recently.  ROS: Per HPI  PMH: She reports that she quit smoking about 8 months ago. Her smoking use included cigarettes. She has a 40.00 pack-year smoking history. She has never used smokeless tobacco. She reports that she drinks about 6.0 standard drinks of alcohol per week. She reports that she does not use drugs.  Objective:  Physical Exam: BP 120/62 (BP Location: Left Arm, Patient Position: Sitting, Cuff Size: Normal)   Pulse 100   Temp 98.1 F (36.7 C) (Oral)   Ht 5\' 5"  (1.651 m)   Wt 164 lb 12.8 oz (74.8 kg)   SpO2 94%   BMI 27.42 kg/m   Wt Readings from Last 3 Encounters:  11/20/17 164 lb 12.8 oz  (74.8 kg)  08/20/17 159 lb 6.4 oz (72.3 kg)  07/20/17 158 lb 12.8 oz (72 kg)  Gen: NAD, resting comfortably CV: RRR with no murmurs appreciated Pulm: NWOB, CTAB with no crackles, wheezes, or rhonchi MSK -Chest: Soft tissue mass noted along superior aspect of sternum.  Results for orders placed or performed in visit on 11/20/17 (from the past 24 hour(s))  POCT glycosylated hemoglobin (Hb A1C)     Status: Abnormal   Collection Time: 11/20/17  3:44 PM  Result Value Ref Range   Hemoglobin A1C 7.2 (A) 4.0 - 5.6 %   HbA1c POC (<> result, manual entry)     HbA1c, POC (prediabetic range)     HbA1c, POC (controlled diabetic range)       Assessment/Plan:  Type 2 diabetes mellitus with neurological complications (HCC) D4K improved to 7.2.  Continue metformin thousand milligrams twice daily and glipizide 10 mg daily.  3 months for repeat A1c.  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  Will check soft tissue ultrasound to further evaluate.  Moderate single current episode of major depressive disorder (HCC) PHQ score significantly elevated.  Will start Celexa 20 mg daily.  She will increase to 40 mg daily if tolerating well.  Follow-up with me in 3 months.  Also discussed referral to behavioral therapy.  Patient will look into this.  Would consider trial of Trintellix if symptoms are not  improving with Celexa.  May need referral back to psychiatry if symptoms are not able to be controlled.  Preventive health care Flu shot given today.  [name]. Jerline Pain, MD 11/20/2017 5:10 PM

## 2017-11-20 NOTE — Assessment & Plan Note (Signed)
A1c improved to 7.2.  Continue metformin thousand milligrams twice daily and glipizide 10 mg daily.  3 months for repeat A1c.

## 2017-11-20 NOTE — Assessment & Plan Note (Signed)
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  Will check soft tissue ultrasound to further evaluate.

## 2017-11-25 ENCOUNTER — Telehealth: Payer: Self-pay | Admitting: Family Medicine

## 2017-11-25 DIAGNOSIS — IMO0001 Reserved for inherently not codable concepts without codable children: Secondary | ICD-10-CM

## 2017-11-25 DIAGNOSIS — E1165 Type 2 diabetes mellitus with hyperglycemia: Principal | ICD-10-CM

## 2017-11-28 ENCOUNTER — Ambulatory Visit: Payer: Self-pay

## 2017-11-28 ENCOUNTER — Ambulatory Visit
Admission: RE | Admit: 2017-11-28 | Discharge: 2017-11-28 | Disposition: A | Discharge status: Home / Self Care | Payer: Medicare Other | Source: Ambulatory Visit | Attending: Oncology | Admitting: Oncology

## 2017-11-28 DIAGNOSIS — Z1231 Encounter for screening mammogram for malignant neoplasm of breast: Secondary | ICD-10-CM

## 2017-12-03 ENCOUNTER — Other Ambulatory Visit: Payer: Self-pay

## 2017-12-03 DIAGNOSIS — IMO0001 Reserved for inherently not codable concepts without codable children: Secondary | ICD-10-CM

## 2017-12-03 DIAGNOSIS — E1165 Type 2 diabetes mellitus with hyperglycemia: Principal | ICD-10-CM

## 2017-12-03 MED ORDER — METFORMIN HCL 1000 MG PO TABS: ORAL_TABLET | ORAL | 1 refills | Status: DC

## 2017-12-03 NOTE — Telephone Encounter (Signed)
Rx sent to pharmacy   

## 2017-12-03 NOTE — Telephone Encounter (Signed)
Pharmacy did not get the  metFORMIN (GLUCOPHAGE) 1000 MG tablet (they did get the glipiZIDE)  Pt request you resend this Rx please [facility] [address] [place] [address] [phone] (Phone) [phone] (Fax)

## 2017-12-26 ENCOUNTER — Ambulatory Visit (HOSPITAL_COMMUNITY)
Admission: RE | Admit: 2017-12-26 | Discharge: 2017-12-26 | Disposition: A | Discharge status: Home / Self Care | Payer: Medicare Other | Source: Ambulatory Visit | Attending: Family Medicine | Admitting: Family Medicine

## 2017-12-26 DIAGNOSIS — M898X8 Other specified disorders of bone, other site: Secondary | ICD-10-CM | POA: Diagnosis not present

## 2017-12-26 DIAGNOSIS — C499 Malignant neoplasm of connective and soft tissue, unspecified: Secondary | ICD-10-CM

## 2017-12-27 NOTE — Progress Notes (Signed)
Please inform patient of the following:  Her ultrasound did not find any abnormalities. Radiologist recommended CT for further evaluation. Please place order for CT chest with contrast for chest wall mass.  [name]. Jerline Pain, MD 12/27/2017 8:16 AM

## 2018-01-01 ENCOUNTER — Other Ambulatory Visit: Payer: Self-pay

## 2018-01-02 ENCOUNTER — Other Ambulatory Visit: Payer: Self-pay

## 2018-01-02 DIAGNOSIS — Z853 Personal history of malignant neoplasm of breast: Secondary | ICD-10-CM

## 2018-02-04 ENCOUNTER — Other Ambulatory Visit (HOSPITAL_COMMUNITY): Payer: Self-pay | Admitting: Pharmacist

## 2018-02-04 MED ORDER — SACUBITRIL-VALSARTAN 97-103 MG PO TABS: 1.0000 | ORAL_TABLET | Freq: Two times a day (BID) | ORAL | 3 refills | Status: DC

## 2018-02-06 ENCOUNTER — Encounter (HOSPITAL_COMMUNITY): Payer: Self-pay

## 2018-02-28 ENCOUNTER — Other Ambulatory Visit: Payer: Self-pay | Admitting: Family Medicine

## 2018-03-04 ENCOUNTER — Ambulatory Visit: Payer: Medicare Other | Admitting: Family Medicine

## 2018-03-05 ENCOUNTER — Telehealth (HOSPITAL_COMMUNITY): Payer: Self-pay | Admitting: Licensed Clinical Social Worker

## 2018-03-05 NOTE — Telephone Encounter (Signed)
CSW received fax from [employer] patient assistance stating that pt has been approved for Entresto until 02/27/2019.  CSW informed pt and told her to call [employer] to order a refill now that she has been approved.  CSW will continue to follow and assist as needed  Jorge Ny, LCSW Clinical Social Worker [facility] [phone]

## 2018-03-06 ENCOUNTER — Ambulatory Visit (INDEPENDENT_AMBULATORY_CARE_PROVIDER_SITE_OTHER): Payer: Medicare Other

## 2018-03-06 ENCOUNTER — Encounter: Payer: Self-pay | Admitting: Family Medicine

## 2018-03-06 ENCOUNTER — Ambulatory Visit (INDEPENDENT_AMBULATORY_CARE_PROVIDER_SITE_OTHER): Payer: Medicare Other | Admitting: Family Medicine

## 2018-03-06 VITALS — BP 99/62 | HR 82 | Temp 97.5°F | Ht 65.0 in | Wt 168.0 lb

## 2018-03-06 DIAGNOSIS — C499 Malignant neoplasm of connective and soft tissue, unspecified: Secondary | ICD-10-CM | POA: Diagnosis not present

## 2018-03-06 DIAGNOSIS — R1031 Right lower quadrant pain: Secondary | ICD-10-CM | POA: Diagnosis not present

## 2018-03-06 DIAGNOSIS — M542 Cervicalgia: Secondary | ICD-10-CM

## 2018-03-06 MED ORDER — KETOROLAC TROMETHAMINE 60 MG/2ML IM SOLN
60.0000 mg | Freq: Once | INTRAMUSCULAR | Status: AC
  Administered 2018-03-06: 60 mg via INTRAMUSCULAR

## 2018-03-06 NOTE — Progress Notes (Signed)
   Subjective:  Emily [name] is a 58 y.o. female who presents today with a chief complaint of groin pain.   HPI:  Groin pain, acute problem Symptoms started about a week ago.  No obvious precipitating or triggering events.  Pain located in right groin.  Does not radiate.  She has tried Advil which is helped a little bit.  No new weakness or numbness.  She has history of complex regional pain syndrome in her lower extremities bilaterally.  No rashes to the area.  No problems....  Pain is worse with movement and weightbearing.  No other obvious alleviating or aggravating factors.  Neck pain, acute problem Also started about a week ago.  Located in bilateral neck.  Has tried Advil which is helped some.  Symptoms seem to be improving.  Pain does not radiate.  No weakness or numbness.  Chest lump, established problem Was seen about 3 or 4 months ago for this.  Had an ultrasound which was inconclusive.  However was recommended to have a follow-up CT scan.  This is not yet been done.  Thinks that the area has grown in size.  ROS: Per HPI  PMH: She reports that she has been smoking cigarettes. She has a 40.00 pack-year smoking history. She has never used smokeless tobacco. She reports current alcohol use of about 6.0 standard drinks of alcohol per week. She reports that she does not use drugs.  Objective:  Physical Exam: BP 99/62 (BP Location: Left Arm, Patient Position: Sitting, Cuff Size: Normal)   Pulse 82   Temp (!) 97.5 F (36.4 C) (Oral)   Ht 5\' 5"  (1.651 m)   Wt 168 lb (76.2 kg)   SpO2 95%   BMI 27.96 kg/m   Wt Readings from Last 3 Encounters:  03/06/18 168 lb (76.2 kg)  11/20/17 164 lb 12.8 oz (74.8 kg)  08/20/17 159 lb 6.4 oz (72.3 kg)  Gen: NAD, resting comfortably CV: RRR with no murmurs appreciated Pulm: NWOB, CTAB with no crackles, wheezes, or rhonchi MSK:  -Neck: No deformities.  Full range of motion throughout.  Mildly tender to palpation along paraspinal  muscles -Upper extremities: Full range of motion throughout.  Strength 5 out of 5 in all directions.  Sensation light touch intact throughout. -Lower extremities.  Right lower extremity with limited internal and external rotation.  These maneuvers elicit pain.  Strength 5 out of 5 with hip extension bilaterally.  Knee strength 5 out of 5 in all directions.  Neurovascular intact distally. -Chest: Mass along superior aspect of sternum  Assessment/Plan:  Groin pain Likely secondary to hip arthritis.  Plain film today without any acute abnormalities based on my read however some subtle signs of degenerative changes.  Will give 60 mg of IM Toradol today.  Recommended against daily use of NSAIDs given her cardiac history.  Will avoid glucocorticoids due to DM. She will follow-up with sports medicine later this week for further evaluation and management.   Neck pain Likely muscular irritation.  No red flags.  Hopefully will have some improvement with Toradol above.  We will follow-up with sports medicine later this week.  Sarcoma [facility]) Check CT as previously planned.   She will follow up in 3 months to recheck A1c.   [name]. Jerline Pain, MD 03/06/2018 12:47 PM

## 2018-03-06 NOTE — Assessment & Plan Note (Signed)
Check CT as previously planned.

## 2018-03-06 NOTE — Patient Instructions (Signed)
It was very nice to see you today!  We will give you an injection of an anti-inflammatory today.  Come back to see Dr Paulla Fore for your neck and hip soon.  Come back to see me in 3 months to recheck your blood sugars, or sooner as needed.   Take care, Dr Jerline Pain

## 2018-03-08 NOTE — Progress Notes (Signed)
Please inform patient of the following:  Radiologist did not see any other abnormalities on her xray. Would like for her to see sports med if her neck and hip pain is not improving.  [name]. Jerline Pain, MD 03/08/2018 12:42 PM

## 2018-03-12 ENCOUNTER — Other Ambulatory Visit (HOSPITAL_COMMUNITY): Payer: Self-pay

## 2018-03-12 DIAGNOSIS — I5032 Chronic diastolic (congestive) heart failure: Secondary | ICD-10-CM

## 2018-03-15 ENCOUNTER — Other Ambulatory Visit: Payer: Self-pay | Admitting: Family Medicine

## 2018-03-26 ENCOUNTER — Telehealth: Payer: Self-pay | Admitting: Family Medicine

## 2018-03-26 NOTE — Telephone Encounter (Signed)
Copied from [place] [id number]. Topic: General - Other >> Mar 26, 2018 10:36 AM Marin Olp L wrote: Reason for CRM: Patient is allergic to contrast dye so she needs the order placed for CT back in November on the 6th 2019 by Juleen China to be resent to the hospital. Patient says cone or Forsan work fine for her. Please advise patient once this has been resolved. CT was of chest and had to include right breast.

## 2018-03-27 ENCOUNTER — Ambulatory Visit (HOSPITAL_BASED_OUTPATIENT_CLINIC_OR_DEPARTMENT_OTHER)
Admission: RE | Admit: 2018-03-27 | Discharge: 2018-03-27 | Disposition: A | Discharge status: Home / Self Care | Payer: Medicare Other | Source: Ambulatory Visit | Attending: Internal Medicine | Admitting: Internal Medicine

## 2018-03-27 ENCOUNTER — Encounter (HOSPITAL_COMMUNITY): Payer: Self-pay | Admitting: Internal Medicine

## 2018-03-27 ENCOUNTER — Other Ambulatory Visit: Payer: Self-pay

## 2018-03-27 ENCOUNTER — Other Ambulatory Visit: Payer: Self-pay | Admitting: Family Medicine

## 2018-03-27 ENCOUNTER — Ambulatory Visit (HOSPITAL_COMMUNITY)
Admission: RE | Admit: 2018-03-27 | Discharge: 2018-03-27 | Disposition: A | Discharge status: Home / Self Care | Payer: Medicare Other | Source: Ambulatory Visit | Attending: Internal Medicine | Admitting: Internal Medicine

## 2018-03-27 VITALS — BP 104/70 | HR 78 | Wt 170.4 lb

## 2018-03-27 DIAGNOSIS — I2510 Atherosclerotic heart disease of native coronary artery without angina pectoris: Secondary | ICD-10-CM | POA: Diagnosis not present

## 2018-03-27 DIAGNOSIS — C761 Malignant neoplasm of thorax: Secondary | ICD-10-CM | POA: Diagnosis not present

## 2018-03-27 DIAGNOSIS — I5022 Chronic systolic (congestive) heart failure: Secondary | ICD-10-CM | POA: Diagnosis not present

## 2018-03-27 DIAGNOSIS — R570 Cardiogenic shock: Secondary | ICD-10-CM | POA: Diagnosis not present

## 2018-03-27 DIAGNOSIS — E785 Hyperlipidemia, unspecified: Secondary | ICD-10-CM | POA: Diagnosis not present

## 2018-03-27 DIAGNOSIS — I110 Hypertensive heart disease with heart failure: Secondary | ICD-10-CM | POA: Insufficient documentation

## 2018-03-27 DIAGNOSIS — Z79899 Other long term (current) drug therapy: Secondary | ICD-10-CM | POA: Insufficient documentation

## 2018-03-27 DIAGNOSIS — Z720 Tobacco use: Secondary | ICD-10-CM

## 2018-03-27 DIAGNOSIS — F1490 Cocaine use, unspecified, uncomplicated: Secondary | ICD-10-CM | POA: Diagnosis not present

## 2018-03-27 DIAGNOSIS — Z853 Personal history of malignant neoplasm of breast: Secondary | ICD-10-CM | POA: Insufficient documentation

## 2018-03-27 DIAGNOSIS — I428 Other cardiomyopathies: Secondary | ICD-10-CM | POA: Insufficient documentation

## 2018-03-27 DIAGNOSIS — Z9049 Acquired absence of other specified parts of digestive tract: Secondary | ICD-10-CM | POA: Insufficient documentation

## 2018-03-27 DIAGNOSIS — I358 Other nonrheumatic aortic valve disorders: Secondary | ICD-10-CM | POA: Insufficient documentation

## 2018-03-27 DIAGNOSIS — I493 Ventricular premature depolarization: Secondary | ICD-10-CM | POA: Insufficient documentation

## 2018-03-27 DIAGNOSIS — I6523 Occlusion and stenosis of bilateral carotid arteries: Secondary | ICD-10-CM

## 2018-03-27 DIAGNOSIS — Z7982 Long term (current) use of aspirin: Secondary | ICD-10-CM | POA: Diagnosis not present

## 2018-03-27 DIAGNOSIS — Z7984 Long term (current) use of oral hypoglycemic drugs: Secondary | ICD-10-CM | POA: Insufficient documentation

## 2018-03-27 DIAGNOSIS — F17200 Nicotine dependence, unspecified, uncomplicated: Secondary | ICD-10-CM

## 2018-03-27 DIAGNOSIS — Z79811 Long term (current) use of aromatase inhibitors: Secondary | ICD-10-CM | POA: Insufficient documentation

## 2018-03-27 DIAGNOSIS — I5032 Chronic diastolic (congestive) heart failure: Secondary | ICD-10-CM

## 2018-03-27 DIAGNOSIS — E119 Type 2 diabetes mellitus without complications: Secondary | ICD-10-CM | POA: Diagnosis not present

## 2018-03-27 DIAGNOSIS — Z91041 Radiographic dye allergy status: Secondary | ICD-10-CM | POA: Diagnosis not present

## 2018-03-27 DIAGNOSIS — J449 Chronic obstructive pulmonary disease, unspecified: Secondary | ICD-10-CM | POA: Insufficient documentation

## 2018-03-27 LAB — BASIC METABOLIC PANEL WITH GFR
Anion gap: 11 (ref 5–15)
BUN: 19 mg/dL (ref 6–20)
CO2: 25 mmol/L (ref 22–32)
Calcium: 9 mg/dL (ref 8.9–10.3)
Chloride: 100 mmol/L (ref 98–111)
Creatinine, Ser: 1.07 mg/dL — ABNORMAL HIGH (ref 0.44–1.00)
GFR calc Af Amer: >60 mL/min
GFR calc non Af Amer: 57 mL/min — ABNORMAL LOW
Glucose, Bld: 183 mg/dL — ABNORMAL HIGH (ref 70–99)
Potassium: 4.7 mmol/L (ref 3.5–5.1)
Sodium: 136 mmol/L (ref 135–145)

## 2018-03-27 NOTE — Telephone Encounter (Signed)
Please advise 

## 2018-03-27 NOTE — Patient Instructions (Signed)
Labs were done today. We will call you with any ABNORMAL results. No news is good news!  Your physician recommends that you schedule a follow-up appointment in 3 MONTHS.  Your physician has requested that you have a carotid duplex. This test is an ultrasound of the carotid arteries in your neck. It looks at blood flow through these arteries that supply the brain with blood. Allow one hour for this exam. There are no restrictions or special instructions. The [facility] office will call you to schedule this test.

## 2018-03-27 NOTE — Progress Notes (Signed)
Patient ID: Emily [name], female   DOB: [birth year], 58 y.o.   MRN: [id number]  ADVANCED HF CLINIC NOTE  Patient ID: Emily [name], female   DOB: 1960-07-20, 58 y.o.   MRN: [id number] PCP: Dr Kathlen Mody Primary Cardiologist: Dr. Haroldine Laws Oncologist: Dr Tami Lin   Emily [name] is a 58 yo woman with a history of ER + R breast cancer s/p R lumpectomy, DM2, cholelithiasis s/p cholecystectomy (02/2013), COPD and systolic CHF due to nonischemic cardiomyopathy.  Patient was admitted in 09/2012 with acute pulmonary edema and cardiogenic shock.  LHC showed moderate nonobstructive CAD (40-50% mLAD, 50-70% LCx, 50% PDA).  She was started on milrinone and diuresed.  Cause of cardiomyopathy suspected to be Adriamycin toxicity.    12/05/12 Carotid Dopplers- R 39 % stenosis and L  40-59% stenosis  Had surgery 4/17 in [place] to remove R chest leiomyosarcoma. Margins not clear so underwent repeat resection here with Dr. Barry Dienes.    Had pancreatitis in 5/19  Admitted May 2017 for recurrent HF symptome. Found to have EF 10-15% with NYHA IIIB-IV symptoms. Was admitted. UDS + cocaine. Cath showed non-obstructive CAD with Cardiogenic shock initial PA sat 34%, 42%. Started on milrinone. Diuresed about 5 pounds. Milrinone weaned off and co-ox dropped down to 52% but was feeling ok.   Last echo 10/19 40-45%  Today she returns for HF follow up. Feels fatigued. Having discomfort behind her right breast, had u/s and said something was irregular. Pending CT scan but allergic to contrast so has to do it at the hospital. Says she is not allergic to iodine but is allergic to gadolinium. She is concerned about return of leiomyosarcoma. Remains SOB with mild activity but has gained about 20-30 pounds. No edema, orthopnea, PND. Still smoking 1 ppd. No cocaine. Taking all medicines including Entresto. PCP dropped carvedilol to 6.25 bid due to very low BP.   Echo today EF 55-60% Personally reviewed   Echo 10/27/12 EF 20-25% Echo 01/09/13 EF  30-35% Echo 06/05/13 EF 45-50% septum dysynnergic Echo 5/17: EF 15-20% Echo 12/01/15 LVEF 55-60% Echo 02/2017 EF 40-45%.   Cath 07/14/15 Ao = 106/72 (87) LV = 108/28 (36) RA = 22 RV = 60/14/25 RA = 67/22 (51) PCW = 31 Fick cardiac output/index = 2.8/1.6 SVR = 1823 PVR = 7.0 WU FA sat = 90% PA sat = 34%, 42%  Assessment: 1. Mild non-obstructive CAD 2. Severe NICM with EF 25% by echo 3. Cardiogenic shock physiology with markedly elevated biventricular filling pressures Lexiscan 09/23/13: EF 50% normal perfusion Holter: SR occasional PVCs  PMH: 1. COPD: PFTs (1/14) with FEV1 68%.  Quit smoking 9/14.  2. Type II diabetes  3. HTN 4. Breast cancer: 2009, s/p Adriamycin/Cytoxan/Taxol chemo, lumpectomy and radiation.   5. Nonischemic cardiomyopathy: Possibly due to Adriamycin.  Echo (8/14) with EF 20-25%, global hypokinesis, moderate diastolic dysfunction, normal RV size and with mild to moderately decreased systolic function, moderate TR.   6. CAD: LHC (8/14) with nonobstructive CAD; 40-50% mLAD, 50-70% LCx, 50% PDA.  7. Hyperlipidemia  SH:   Mother is next door.  Still smoking.   FH: No premature CAD.  No family history of cardiomyopathy.   Review of systems complete and found to be negative unless listed in HPI.    Current Outpatient Medications  Medication Sig Dispense Refill  . albuterol (PROVENTIL HFA;VENTOLIN HFA) 108 (90 BASE) MCG/ACT inhaler Inhale 2 puffs into the lungs every 6 (six) hours as needed for wheezing or shortness of breath. 1  Inhaler 0  . aspirin EC 81 MG tablet Take 1 tablet (81 mg total) by mouth daily. 90 tablet 3  . atorvastatin (LIPITOR) 20 MG tablet TAKE 1 TABLET BY MOUTH ONCE DAILY 90 tablet 2  . carvedilol (COREG) 6.25 MG tablet Take 1 tablet (6.25 mg total) by mouth 2 (two) times daily. 270 tablet 3  . citalopram (CELEXA) 20 MG tablet TAKE 2 TABLETS BY MOUTH ONCE DAILY 60 tablet 0  . fluorouracil (EFUDEX) 5 % cream Apply topically 2 (two) times  daily.    . furosemide (LASIX) 20 MG tablet TAKE 1 TABLET BY MOUTH ONCE DAILY TAKE  EXTRA  20  MG  TABLET  ONCE  DAILY AS NEEDED FOR  WEIGHT  GAIN  3  POUNDS  OR  MORE  OVERNIGHT 180 tablet 3  . glipiZIDE (GLUCOTROL) 10 MG tablet TAKE 1 TABLET BY MOUTH IN THE MORNING AND 1 TABLET BEFORE DINNER 180 tablet 1  . hydrOXYzine (ATARAX/VISTARIL) 50 MG tablet TAKE 1 TO 2 TABLETS BY MOUTH THREE TIMES DAILY AS NEEDED FOR ITCHING 90 tablet 0  . letrozole (FEMARA) 2.5 MG tablet TAKE 1 TABLET BY MOUTH IN THE MORNING 90 tablet 2  . metFORMIN (GLUCOPHAGE) 1000 MG tablet TAKE 1 TABLET BY MOUTH TWICE DAILY WITH A MEAL 180 tablet 1  . ondansetron (ZOFRAN ODT) 4 MG disintegrating tablet Take 1 tablet (4 mg total) by mouth every 8 (eight) hours as needed for nausea or vomiting. 10 tablet 0  . sacubitril-valsartan (ENTRESTO) 97-103 MG Take 1 tablet by mouth 2 (two) times daily. 180 tablet 3  . spironolactone (ALDACTONE) 25 MG tablet Take 0.5 tablets (12.5 mg total) by mouth daily. 45 tablet 6  . glucose blood test strip Check blood sugar three times daily 100 each 11  . LORazepam (ATIVAN) 2 MG tablet TAKE 1 TABLET BY MOUTH AT BEDTIME AS NEEDED FOR ANXIETY 90 tablet 0   No current facility-administered medications for this encounter.     Vitals:   03/27/18 1029  BP: 104/70  Pulse: 78  SpO2: 94%  Weight: 77.3 kg (170 lb 6.4 oz)   Wt Readings from Last 3 Encounters:  03/27/18 77.3 kg (170 lb 6.4 oz)  03/06/18 76.2 kg (168 lb)  11/20/17 74.8 kg (164 lb 12.8 oz)    General:  Well appearing. No resp difficulty HEENT: normal Neck: supple. no JVD. Carotids 2+ bilat; no bruits. No lymphadenopathy or thryomegaly appreciated. Cor: PMI nondisplaced. Regular rate & rhythm. No rubs, gallops or murmurs. Lungs: clear decreased breath sounds throughout  Abdomen: soft, nontender, nondistended. No hepatosplenomegaly. No bruits or masses. Good bowel sounds. Extremities: no cyanosis, clubbing, rash, edema Neuro: alert &  orientedx3, cranial nerves grossly intact. moves all 4 extremities w/o difficulty. Affect pleasant   Assessment/Plan:  1. Chronic systolic CHF: NICM, likely Adriamycin toxicity, - Recent ECHO 02/2017 EF 40-45%. - Echo today EF 55-60% Personally reviewed - NYHA II-III but likely not due to HF. Volume status looks good. Continue lasix 40 mg daily.  - Continue Entresto 97/103.  -Continue spiro 12.5 mg daily. Unable to tolerate 25 daily due to hyperK - Continue lasix 20 daily.  - Continue carvedilol to 6.25 BID - recently decreased due to low BP - Check BMET today  2. CAD: Moderate nonobstructive disease. - No s/s ischemia. Continue ASA and statin 3. COPD/tobacco use -Smoking. Again discussed need for smoking cessation. 4. Cocaine use - Remains.Abstinent 5. Breast Cancer: - Previously received adriamycin. Cancer free for > 5  years. No change. 6. Carotid stenosis:  - Asymptomatic. R 1-39% L 40-59% in 1/19 - Due for repeat. Will schedule 7. R chest leiomyosarcoma: - Had further resections and margins now clear. Sees Dr. Alen Blew and Dr. Barry Dienes.  - Has w/u for recurrence ongoing 8. DM2 - consider Jardiance in future  Glori Bickers, MD  10:52 AM

## 2018-03-27 NOTE — Progress Notes (Signed)
  Echocardiogram 2D Echocardiogram has been performed.  Emily [name] Emily [name] 03/27/2018, 11:03 AM

## 2018-04-02 ENCOUNTER — Encounter (HOSPITAL_COMMUNITY): Payer: Medicare Other

## 2018-04-02 ENCOUNTER — Ambulatory Visit (HOSPITAL_COMMUNITY)
Admission: RE | Admit: 2018-04-02 | Discharge: 2018-04-02 | Disposition: A | Discharge status: Home / Self Care | Payer: Medicare Other | Source: Ambulatory Visit | Attending: Family Medicine | Admitting: Family Medicine

## 2018-04-02 DIAGNOSIS — Z853 Personal history of malignant neoplasm of breast: Secondary | ICD-10-CM | POA: Diagnosis present

## 2018-04-04 NOTE — Progress Notes (Signed)
Please inform patient of the following:  Please ask patient to come in to go over CT scan results and next steps.  [name]. Jerline Pain, MD 04/04/2018 1:11 PM

## 2018-04-06 ENCOUNTER — Other Ambulatory Visit (HOSPITAL_COMMUNITY): Payer: Self-pay | Admitting: Internal Medicine

## 2018-04-06 DIAGNOSIS — E785 Hyperlipidemia, unspecified: Secondary | ICD-10-CM

## 2018-04-08 ENCOUNTER — Encounter: Payer: Self-pay | Admitting: Family Medicine

## 2018-04-08 ENCOUNTER — Ambulatory Visit (INDEPENDENT_AMBULATORY_CARE_PROVIDER_SITE_OTHER): Payer: Medicare Other | Admitting: Family Medicine

## 2018-04-08 VITALS — BP 97/63 | HR 79 | Temp 97.6°F | Ht 65.0 in | Wt 168.0 lb

## 2018-04-08 DIAGNOSIS — C499 Malignant neoplasm of connective and soft tissue, unspecified: Secondary | ICD-10-CM

## 2018-04-08 DIAGNOSIS — F321 Major depressive disorder, single episode, moderate: Secondary | ICD-10-CM

## 2018-04-08 DIAGNOSIS — S2231XA Fracture of one rib, right side, initial encounter for closed fracture: Secondary | ICD-10-CM | POA: Diagnosis not present

## 2018-04-08 DIAGNOSIS — G4700 Insomnia, unspecified: Secondary | ICD-10-CM

## 2018-04-08 MED ORDER — HYDROCODONE-ACETAMINOPHEN 5-325 MG PO TABS: 1.0000 | ORAL_TABLET | Freq: Four times a day (QID) | ORAL | 0 refills | Status: DC | PRN

## 2018-04-08 MED ORDER — CITALOPRAM HYDROBROMIDE 40 MG PO TABS: 40.0000 mg | ORAL_TABLET | Freq: Every day | ORAL | 3 refills | Status: DC

## 2018-04-08 NOTE — Assessment & Plan Note (Signed)
Doing well on Celexa 40 mg daily.  Will refill.

## 2018-04-08 NOTE — Assessment & Plan Note (Signed)
Prior right third rib fracture is concerning for possible metastatic disease.  Recommended follow-up with oncology to discuss neck steps.  Patient voiced understanding.

## 2018-04-08 NOTE — Progress Notes (Signed)
   Chief Complaint:  Emily [name] is a 58 y.o. female who presents today with a chief complaint of right rib [name].   Assessment/Plan:  Right third rib fracture Noted on CT scan with possible underlying hematoma.  Will start small supply of Norco for [name] control.  Encouraged good inspirations.  Encouraged frequent ambulation.  Discussed reasons to return to care.  Follow-up as needed.  Sarcoma [facility]) Prior right third rib fracture is concerning for possible metastatic disease.  Recommended follow-up with oncology to discuss neck steps.  Patient voiced understanding.  Moderate single current episode of major depressive disorder (HCC) Doing well on Celexa 40 mg daily.  Will refill.  Insomnia Stable.  Continue Ativan as needed.  Advised her to follow-up soon to recheck A1c discussed her other chronic medical conditions.    Subjective:  HPI:  Right Rib [name]  Was seen about a month ago for neck [name] and a lump on her chest.  She had a CT scan done which revealed a right third rib fracture with underlying hematoma versus sarcoma recurrence.  She has a lot of [name] to her right side just under her breast for the past several weeks.  [name] is much worse with breathing and coughing.  She has not tried anything to help with the [name].  No nausea or vomiting.  No shortness of breath.  She has not followed up with her oncologist for several months.  # Depression  / Anxiety / Insomnia -On Celexa 40 mg daily and doing well.  No reported side effects. - Uses ativan 2mg  at bedtime as needed -ROS: No reported SI or HI  ROS: Per HPI  PMH: She reports that she has been smoking cigarettes. She has a 40.00 pack-year smoking history. She has never used smokeless tobacco. She reports current alcohol use of about 6.0 standard drinks of alcohol per week. She reports that she does not use drugs.      Objective:  Physical Exam: BP 97/63 (BP Location: Left Arm, Patient Position: Sitting, Cuff Size: Normal)    Pulse 79   Temp 97.6 F (36.4 C) (Oral)   Ht 5\' 5"  (1.651 m)   Wt 168 lb (76.2 kg)   SpO2 98%   BMI 27.96 kg/m   Gen: NAD, resting comfortably CV: Regular rate and rhythm with no murmurs appreciated Pulm: Normal work of breathing, clear to auscultation bilaterally with no crackles, wheezes, or rhonchi Chest: Soft tissue mass along superior aspect of sternum.      [name]. Emily Pain, MD 04/08/2018 2:43 PM

## 2018-04-08 NOTE — Assessment & Plan Note (Signed)
Stable.  Continue Ativan as needed.

## 2018-04-08 NOTE — Patient Instructions (Addendum)
It was very nice to see you today!  You have a fractured rib on your right side.  This is most likely causing your symptoms.  Please use the Norco as needed.  I cannot do this as a long-term medication.  Please follow-up with Dr. Alen Blew to see if there is anything else that needs to be done.  I will refill your Celexa today.  Please come back to see me in a few months to recheck your diabetes.  Take care, Dr Jerline Pain

## 2018-04-09 ENCOUNTER — Ambulatory Visit (HOSPITAL_COMMUNITY)
Admission: RE | Admit: 2018-04-09 | Discharge: 2018-04-09 | Disposition: A | Discharge status: Home / Self Care | Payer: Medicare Other | Source: Ambulatory Visit | Attending: Cardiology | Admitting: Cardiology

## 2018-04-09 ENCOUNTER — Telehealth: Payer: Self-pay | Admitting: Oncology

## 2018-04-09 DIAGNOSIS — I6523 Occlusion and stenosis of bilateral carotid arteries: Secondary | ICD-10-CM

## 2018-04-09 NOTE — Telephone Encounter (Signed)
Patient called to schedule  °

## 2018-04-19 ENCOUNTER — Telehealth (HOSPITAL_COMMUNITY): Payer: Self-pay

## 2018-04-19 NOTE — Telephone Encounter (Signed)
Relayed message to pt, verbalized understanding and will continue statin. Confirmed f/u appointment

## 2018-04-19 NOTE — Telephone Encounter (Signed)
-----   Message from Jolaine Artist, MD sent at 04/19/2018  1:51 PM EST ----- Mild bilateral carotid stenosis 1-39%. Continue statin

## 2018-05-17 ENCOUNTER — Other Ambulatory Visit: Payer: Self-pay | Admitting: Family Medicine

## 2018-05-17 DIAGNOSIS — E1165 Type 2 diabetes mellitus with hyperglycemia: Principal | ICD-10-CM

## 2018-05-17 DIAGNOSIS — IMO0001 Reserved for inherently not codable concepts without codable children: Secondary | ICD-10-CM

## 2018-05-26 ENCOUNTER — Other Ambulatory Visit: Payer: Self-pay | Admitting: Family Medicine

## 2018-05-28 ENCOUNTER — Ambulatory Visit: Payer: Medicare Other | Admitting: Oncology

## 2018-06-06 ENCOUNTER — Ambulatory Visit: Payer: Medicare Other | Admitting: Family Medicine

## 2018-06-21 ENCOUNTER — Other Ambulatory Visit: Payer: Self-pay | Admitting: Family Medicine

## 2018-06-21 DIAGNOSIS — IMO0001 Reserved for inherently not codable concepts without codable children: Secondary | ICD-10-CM

## 2018-06-21 DIAGNOSIS — E1165 Type 2 diabetes mellitus with hyperglycemia: Principal | ICD-10-CM

## 2018-06-25 ENCOUNTER — Other Ambulatory Visit: Payer: Self-pay | Admitting: Family Medicine

## 2018-06-25 DIAGNOSIS — E1165 Type 2 diabetes mellitus with hyperglycemia: Principal | ICD-10-CM

## 2018-06-25 DIAGNOSIS — IMO0001 Reserved for inherently not codable concepts without codable children: Secondary | ICD-10-CM

## 2018-06-25 NOTE — Telephone Encounter (Signed)
Please advise 

## 2018-06-26 ENCOUNTER — Encounter (HOSPITAL_COMMUNITY): Payer: Medicare Other | Admitting: Internal Medicine

## 2018-07-04 ENCOUNTER — Ambulatory Visit: Payer: Medicare Other | Admitting: Family Medicine

## 2018-07-09 ENCOUNTER — Ambulatory Visit (HOSPITAL_COMMUNITY)
Admission: RE | Admit: 2018-07-09 | Discharge: 2018-07-09 | Disposition: A | Discharge status: Home / Self Care | Payer: Medicare Other | Source: Ambulatory Visit | Attending: Internal Medicine | Admitting: Internal Medicine

## 2018-07-09 ENCOUNTER — Other Ambulatory Visit: Payer: Self-pay

## 2018-07-09 ENCOUNTER — Other Ambulatory Visit (HOSPITAL_COMMUNITY): Payer: Self-pay | Admitting: Internal Medicine

## 2018-07-09 DIAGNOSIS — E785 Hyperlipidemia, unspecified: Secondary | ICD-10-CM

## 2018-07-28 ENCOUNTER — Other Ambulatory Visit: Payer: Self-pay | Admitting: Oncology

## 2018-08-05 LAB — HEMOGLOBIN A1C: Hemoglobin A1C: 8.9

## 2018-08-07 ENCOUNTER — Other Ambulatory Visit: Payer: Self-pay | Admitting: Family Medicine

## 2018-08-07 ENCOUNTER — Other Ambulatory Visit (HOSPITAL_COMMUNITY): Payer: Self-pay | Admitting: Internal Medicine

## 2018-08-26 ENCOUNTER — Other Ambulatory Visit (HOSPITAL_COMMUNITY): Payer: Self-pay | Admitting: Adult Health

## 2018-08-26 ENCOUNTER — Other Ambulatory Visit: Payer: Self-pay | Admitting: Family Medicine

## 2018-08-26 DIAGNOSIS — IMO0001 Reserved for inherently not codable concepts without codable children: Secondary | ICD-10-CM

## 2018-08-27 ENCOUNTER — Inpatient Hospital Stay: Payer: Medicare Other | Attending: Oncology | Admitting: Oncology

## 2018-08-27 ENCOUNTER — Other Ambulatory Visit: Payer: Self-pay

## 2018-08-27 DIAGNOSIS — M818 Other osteoporosis without current pathological fracture: Secondary | ICD-10-CM | POA: Diagnosis not present

## 2018-08-27 DIAGNOSIS — Z853 Personal history of malignant neoplasm of breast: Secondary | ICD-10-CM | POA: Diagnosis not present

## 2018-08-27 DIAGNOSIS — C419 Malignant neoplasm of bone and articular cartilage, unspecified: Secondary | ICD-10-CM

## 2018-08-27 DIAGNOSIS — Z79899 Other long term (current) drug therapy: Secondary | ICD-10-CM | POA: Diagnosis not present

## 2018-08-27 DIAGNOSIS — Z8529 Personal history of malignant neoplasm of other respiratory and intrathoracic organs: Secondary | ICD-10-CM

## 2018-08-27 NOTE — Progress Notes (Signed)
Hematology and Oncology Follow Up Visit  Emily [name] [id number] January 16, 1961 58 y.o. 08/27/2018 2:55 PM Emily [name], MDParker, Emily Greenhouse, MD   Principle Diagnosis: 58 year old woman with:  1. 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  She is status post surgical resection and remains disease-free. 2. Breast cancer diagnosed in 2009. At that time she had a stage IIIc disease with 3.1 cm tumor and 5 out of 7 lymph nodes involved. Her tumor was ER/PR positive HER-2 negative.    Prior Therapy:   She was treated with Adriamycin and cyclophosphamide in a dose dense fashion and weekly Taxol. She received radiation after lumpectomy and tamoxifen and subsequently Arimidex and subsequently letrozole.  She is status post wide excision of her right chest wall leiomyosarcoma done on 12/28/2015. The pathology showed no residual malignancy.  Current therapy: Letrozole 2.5 mg daily since 2010.  Interim History: Emily [name] returns today for a repeat evaluation.  Since the last visit, she sustained rib fracture spontaneously and a CT scan of the chest obtained on April 02, 2018 showed an 8 mm nodule in the lateral to the third rib that required further attention in the future.  Clinically she reports her right rib pain is improving but not completely resolved.  She denies recent falls or trauma.  She continues to tolerate letrozole without any recent complaints.  Performance status and activity level are unchanged.   She denied any alteration mental status, neuropathy, confusion or dizziness.  Denies any headaches or lethargy.  Denies any night sweats, weight loss or changes in appetite.  Denied orthopnea, dyspnea on exertion or chest discomfort.  Denies shortness of breath, difficulty breathing hemoptysis or cough.  Denies any abdominal distention, nausea, early satiety or dyspepsia.  Denies any hematuria, frequency, dysuria or nocturia.  Denies any skin irritation, dryness or rash.   Denies any ecchymosis or petechiae.  Denies any lymphadenopathy or clotting.  Denies any heat or cold intolerance.  Denies any anxiety or depression.  Remaining review of system is negative.     Medications: I have reviewed the patient's current medications.  Current Outpatient Medications  Medication Sig Dispense Refill  . albuterol (PROVENTIL HFA;VENTOLIN HFA) 108 (90 BASE) MCG/ACT inhaler Inhale 2 puffs into the lungs every 6 (six) hours as needed for wheezing or shortness of breath. 1 Inhaler 0  . aspirin EC 81 MG tablet Take 1 tablet (81 mg total) by mouth daily. 90 tablet 3  . atorvastatin (LIPITOR) 20 MG tablet Take 1 tablet by mouth once daily 90 tablet 0  . carvedilol (COREG) 6.25 MG tablet TAKE 1 & 1/2 (ONE & ONE-HALF) TABLETS BY MOUTH TWICE DAILY 270 tablet 0  . citalopram (CELEXA) 40 MG tablet Take 1 tablet (40 mg total) by mouth daily. 90 tablet 3  . fluorouracil (EFUDEX) 5 % cream Apply topically 2 (two) times daily.    . furosemide (LASIX) 20 MG tablet TAKE 1 TABLET BY MOUTH ONCE DAILY TAKE AN EXTRA 20 MG TABLET ONCE DAILY AS NEEDED FOR WEIGHT GAIN 3 POUNDS OR MORE OVERNIGHT 180 tablet 0  . glipiZIDE (GLUCOTROL) 10 MG tablet TAKE 1 TABLET BY MOUTH IN THE MORNING AND 1 TABLET BEFORE DINNER 180 tablet 0  . glucose blood test strip Check blood sugar three times daily 100 each 11  . HYDROcodone-acetaminophen (NORCO) 5-325 MG tablet Take 1 tablet by mouth every 6 (six) hours as needed for moderate pain. 30 tablet 0  . hydrOXYzine (ATARAX/VISTARIL) 50 MG tablet  TAKE 1 TO 2 TABLETS BY MOUTH THREE TIMES DAILY AS NEEDED FOR ITCHING 90 tablet 0  . letrozole (FEMARA) 2.5 MG tablet TAKE 1 TABLET BY MOUTH IN THE MORNING 90 tablet 0  . LORazepam (ATIVAN) 2 MG tablet TAKE 1 TABLET BY MOUTH AT BEDTIME AS NEEDED FOR ANXIETY 90 tablet 0  . metFORMIN (GLUCOPHAGE) 1000 MG tablet TAKE 1 TABLET BY MOUTH TWICE DAILY WITH MEALS 180 tablet 0  . ondansetron (ZOFRAN ODT) 4 MG disintegrating tablet Take 1  tablet (4 mg total) by mouth every 8 (eight) hours as needed for nausea or vomiting. 10 tablet 0  . sacubitril-valsartan (ENTRESTO) 97-103 MG Take 1 tablet by mouth 2 (two) times daily. 180 tablet 3  . spironolactone (ALDACTONE) 25 MG tablet Take 0.5 tablets (12.5 mg total) by mouth daily. 45 tablet 6   No current facility-administered medications for this visit.      Allergies:  Allergies  Allergen Reactions  . Contrast Media [Iodinated Diagnostic Agents] Anaphylaxis  . Gadolinium Shortness Of Breath and Other (See Comments)     Code: SOB, Onset Date: [id number]     Past Medical History, Surgical history, Social history, and Family History were reviewed and updated.   Physical Exam: Blood pressure 100/71, pulse 100, temperature 98 F (36.7 C), temperature source Oral, resp. rate 18, height _0  (1.651 m), weight 170 lb 14.4 oz (77.5 kg), SpO2 96 %.    ECOG: 1   General appearance: Comfortable appearing without any discomfort Head: Normocephalic without any trauma Oropharynx: Mucous membranes are moist and pink without any thrush or ulcers. Eyes: Pupils are equal and round reactive to light. Lymph nodes: No cervical, supraclavicular, inguinal or axillary lymphadenopathy.   Heart:regular rate and rhythm.  S1 and S2 without leg edema. Lung: Clear without any rhonchi or wheezes.  No dullness to percussion. Abdomin: Soft, nontender, nondistended with good bowel sounds.  No hepatosplenomegaly. Musculoskeletal: No joint deformity or effusion.  Full range of motion noted. Neurological: No deficits noted on motor, sensory and deep tendon reflex exam. Skin: No petechial rash or dryness.  Appeared moist.     Lab Results: Lab Results  Component Value Date   WBC 12.2 (H) 07/16/2017   HGB 13.3 07/16/2017   HCT 39.8 07/16/2017   MCV 84.5 07/16/2017   PLT 223 07/16/2017     Chemistry      Component Value Date/Time   NA 136 03/27/2018 1123   NA 136 10/06/2016 0838   NA 134 (L)  03/22/2016 1110   K 4.7 03/27/2018 1123   K 5.1 03/22/2016 1110   CL 100 03/27/2018 1123   CL 102 06/21/2012   CO2 25 03/27/2018 1123   CO2 27 03/22/2016 1110   BUN 19 03/27/2018 1123   BUN 25 (H) 10/06/2016 0838   BUN 27.7 (H) 03/22/2016 1110   CREATININE 1.07 (H) 03/27/2018 1123   CREATININE 1.2 (H) 03/22/2016 1110      Component Value Date/Time   CALCIUM 9.0 03/27/2018 1123   CALCIUM 9.6 03/22/2016 1110   ALKPHOS 63 07/16/2017 1429   ALKPHOS 90 03/22/2016 1110   AST 16 07/16/2017 1429   AST 13 03/22/2016 1110   ALT 15 07/16/2017 1429   ALT 15 03/22/2016 1110   BILITOT 0.7 07/16/2017 1429   BILITOT 0.27 03/22/2016 1110          Impression and Plan:  58 year old woman with:   1.  Chest wall leiomyosarcoma diagnosed in 2017.  Continues to be disease-free  without any evidence of relapse.   CT scan of the chest obtained on April 02, 2018 was personally reviewed and did not show any convincing evidence of metastasis.  There are small pulmonary nodule that requires follow-up which we will do forward.  Plan is to repeat imaging studies in September for surveillance purposes.   2.  Stage IIIc breast cancer diagnosed 2009.  She has no evidence of disease relapse at this time and completed 10 years of letrozole.  Risks and benefits of discontinuation of therapy was discussed.  Risk of relapse was also reviewed.  At this time her preference is to discontinue when I agreed with this assessment at this time.  She will continue to have annual mammography.   3. Osteoporosis: Repeat DEXA scan will be needed in the future for surveillance purposes.  4. Follow-up: In 3 months for repeat evaluation.  15  minutes was spent with the patient face-to-face today.  More than 50% of time was spent on reviewing her disease status, imaging studies and discussing treatment options at the future.   Zola Button, MD 6/30/20202:55 PM

## 2018-08-28 ENCOUNTER — Telehealth: Payer: Self-pay | Admitting: Oncology

## 2018-08-28 ENCOUNTER — Ambulatory Visit (HOSPITAL_COMMUNITY): Payer: Medicare Other

## 2018-08-28 NOTE — Telephone Encounter (Signed)
Called and left msg about appt

## 2018-09-04 ENCOUNTER — Encounter: Payer: Self-pay | Admitting: Family Medicine

## 2018-09-13 ENCOUNTER — Other Ambulatory Visit: Payer: Self-pay

## 2018-09-13 ENCOUNTER — Ambulatory Visit (INDEPENDENT_AMBULATORY_CARE_PROVIDER_SITE_OTHER): Payer: Medicare Other | Admitting: Family Medicine

## 2018-09-13 ENCOUNTER — Encounter: Payer: Self-pay | Admitting: Family Medicine

## 2018-09-13 VITALS — BP 126/72 | HR 86 | Temp 97.9°F | Ht 64.25 in | Wt 167.0 lb

## 2018-09-13 DIAGNOSIS — F321 Major depressive disorder, single episode, moderate: Secondary | ICD-10-CM

## 2018-09-13 DIAGNOSIS — E1169 Type 2 diabetes mellitus with other specified complication: Secondary | ICD-10-CM | POA: Diagnosis not present

## 2018-09-13 DIAGNOSIS — IMO0001 Reserved for inherently not codable concepts without codable children: Secondary | ICD-10-CM

## 2018-09-13 DIAGNOSIS — R1310 Dysphagia, unspecified: Secondary | ICD-10-CM

## 2018-09-13 DIAGNOSIS — K861 Other chronic pancreatitis: Secondary | ICD-10-CM

## 2018-09-13 DIAGNOSIS — I152 Hypertension secondary to endocrine disorders: Secondary | ICD-10-CM

## 2018-09-13 DIAGNOSIS — R5383 Other fatigue: Secondary | ICD-10-CM

## 2018-09-13 DIAGNOSIS — I10 Essential (primary) hypertension: Secondary | ICD-10-CM

## 2018-09-13 DIAGNOSIS — E785 Hyperlipidemia, unspecified: Secondary | ICD-10-CM

## 2018-09-13 DIAGNOSIS — E1165 Type 2 diabetes mellitus with hyperglycemia: Secondary | ICD-10-CM

## 2018-09-13 DIAGNOSIS — Z0001 Encounter for general adult medical examination with abnormal findings: Secondary | ICD-10-CM

## 2018-09-13 DIAGNOSIS — E1149 Type 2 diabetes mellitus with other diabetic neurological complication: Secondary | ICD-10-CM

## 2018-09-13 DIAGNOSIS — E559 Vitamin D deficiency, unspecified: Secondary | ICD-10-CM

## 2018-09-13 DIAGNOSIS — I5032 Chronic diastolic (congestive) heart failure: Secondary | ICD-10-CM

## 2018-09-13 DIAGNOSIS — F17210 Nicotine dependence, cigarettes, uncomplicated: Secondary | ICD-10-CM

## 2018-09-13 DIAGNOSIS — F17200 Nicotine dependence, unspecified, uncomplicated: Secondary | ICD-10-CM

## 2018-09-13 DIAGNOSIS — E1159 Type 2 diabetes mellitus with other circulatory complications: Secondary | ICD-10-CM | POA: Diagnosis not present

## 2018-09-13 LAB — LIPID PANEL
Cholesterol: 111 mg/dL (ref 0–200)
HDL: 39.9 mg/dL (ref >39.00)
LDL Cholesterol: 31 mg/dL (ref 0–99)
NonHDL: 71.18
Total CHOL/HDL Ratio: 3
Triglycerides: 199 mg/dL — ABNORMAL HIGH (ref 0.0–149.0)
VLDL: 39.8 mg/dL (ref 0.0–40.0)

## 2018-09-13 LAB — VITAMIN B12: Vitamin B-12: 104 pg/mL — ABNORMAL LOW (ref 211–911)

## 2018-09-13 LAB — COMPREHENSIVE METABOLIC PANEL
ALT: 22 U/L (ref 0–35)
AST: 22 U/L (ref 0–37)
Albumin: 4.5 g/dL (ref 3.5–5.2)
Alkaline Phosphatase: 55 U/L (ref 39–117)
BUN: 37 mg/dL — ABNORMAL HIGH (ref 6–23)
CO2: 24 mEq/L (ref 19–32)
Calcium: 9.3 mg/dL (ref 8.4–10.5)
Chloride: 102 mEq/L (ref 96–112)
Creatinine, Ser: 1.5 mg/dL — ABNORMAL HIGH (ref 0.40–1.20)
GFR: 35.41 mL/min — ABNORMAL LOW (ref >60.00)
Glucose, Bld: 160 mg/dL — ABNORMAL HIGH (ref 70–99)
Potassium: 4.7 mEq/L (ref 3.5–5.1)
Sodium: 139 mEq/L (ref 135–145)
Total Bilirubin: 0.5 mg/dL (ref 0.2–1.2)
Total Protein: 7 g/dL (ref 6.0–8.3)

## 2018-09-13 LAB — CBC
HCT: 39.9 % (ref 36.0–46.0)
Hemoglobin: 13.4 g/dL (ref 12.0–15.0)
MCHC: 33.6 g/dL (ref 30.0–36.0)
MCV: 88 fl (ref 78.0–100.0)
Platelets: 221 10*3/uL (ref 150.0–400.0)
RBC: 4.53 Mil/uL (ref 3.87–5.11)
RDW: 13.6 % (ref 11.5–15.5)
WBC: 10.2 10*3/uL (ref 4.0–10.5)

## 2018-09-13 LAB — TSH: TSH: 2.38 u[IU]/mL (ref 0.35–4.50)

## 2018-09-13 LAB — VITAMIN D 25 HYDROXY (VIT D DEFICIENCY, FRACTURES): VITD: 23.59 ng/mL — ABNORMAL LOW (ref 30.00–100.00)

## 2018-09-13 MED ORDER — GLIPIZIDE 10 MG PO TABS: 10.0000 mg | ORAL_TABLET | Freq: Two times a day (BID) | ORAL | 3 refills | Status: DC

## 2018-09-13 MED ORDER — BUPROPION HCL ER (XL) 150 MG PO TB24: 150.0000 mg | ORAL_TABLET | Freq: Every day | ORAL | 5 refills | Status: DC

## 2018-09-13 MED ORDER — METFORMIN HCL 1000 MG PO TABS: 1000.0000 mg | ORAL_TABLET | Freq: Two times a day (BID) | ORAL | 3 refills | Status: DC

## 2018-09-13 MED ORDER — JARDIANCE 25 MG PO TABS: 25.0000 mg | ORAL_TABLET | Freq: Every day | ORAL | 5 refills | Status: DC

## 2018-09-13 NOTE — Assessment & Plan Note (Signed)
Check vitamin D level 

## 2018-09-13 NOTE — Assessment & Plan Note (Signed)
Elevated PHQ.  We will add on Wellbutrin 150 mg to her regimen.  Continue Celexa 40 mg daily.

## 2018-09-13 NOTE — Patient Instructions (Signed)
It was very nice to see you today!  Please start the wellbutrin and jardiance.  We will check blood work today.  Come back to see me in 3-6 months, or sooner as needed  Take care, Dr Jerline Pain  Please try these tips to maintain a healthy lifestyle:   Eat at least 3 REAL meals and 1-2 snacks per day.  Aim for no more than 5 hours between eating.  If you eat breakfast, please do so within one hour of getting up.    Obtain twice as many fruits/vegetables as protein or carbohydrate foods for both lunch and dinner. (Half of each meal should be fruits/vegetables, one quarter protein, and one quarter starchy carbs)   Cut down on sweet beverages. This includes juice, soda, and sweet tea.    Exercise at least 150 minutes every week.    Preventive Care 60-60 Years Old, Female Preventive care refers to visits with your health care provider and lifestyle choices that can promote health and wellness. This includes:  A yearly physical exam. This may also be called an annual well check.  Regular dental visits and eye exams.  Immunizations.  Screening for certain conditions.  Healthy lifestyle choices, such as eating a healthy diet, getting regular exercise, not using drugs or products that contain nicotine and tobacco, and limiting alcohol use. What can I expect for my preventive care visit? Physical exam Your health care provider will check your:  Height and weight. This may be used to calculate body mass index (BMI), which tells if you are at a healthy weight.  Heart rate and blood pressure.  Skin for abnormal spots. Counseling Your health care provider may ask you questions about your:  Alcohol, tobacco, and drug use.  Emotional well-being.  Home and relationship well-being.  Sexual activity.  Eating habits.  Work and work Statistician.  Method of birth control.  Menstrual cycle.  Pregnancy history. What immunizations do I need?  Influenza (flu) vaccine  This is  recommended every year. Tetanus, diphtheria, and pertussis (Tdap) vaccine  You may need a Td booster every 10 years. Varicella (chickenpox) vaccine  You may need this if you have not been vaccinated. Zoster (shingles) vaccine  You may need this after age 60. Measles, mumps, and rubella (MMR) vaccine  You may need at least one dose of MMR if you were born in 1957 or later. You may also need a second dose. Pneumococcal conjugate (PCV13) vaccine  You may need this if you have certain conditions and were not previously vaccinated. Pneumococcal polysaccharide (PPSV23) vaccine  You may need one or two doses if you smoke cigarettes or if you have certain conditions. Meningococcal conjugate (MenACWY) vaccine  You may need this if you have certain conditions. Hepatitis A vaccine  You may need this if you have certain conditions or if you travel or work in places where you may be exposed to hepatitis A. Hepatitis B vaccine  You may need this if you have certain conditions or if you travel or work in places where you may be exposed to hepatitis B. Haemophilus influenzae type b (Hib) vaccine  You may need this if you have certain conditions. Human papillomavirus (HPV) vaccine  If recommended by your health care provider, you may need three doses over 6 months. You may receive vaccines as individual doses or as more than one vaccine together in one shot (combination vaccines). Talk with your health care provider about the risks and benefits of combination vaccines. What tests  do I need? Blood tests  Lipid and cholesterol levels. These may be checked every 5 years, or more frequently if you are over 60 years old.  Hepatitis C test.  Hepatitis B test. Screening  Lung cancer screening. You may have this screening every year starting at age 60 if you have a 30-pack-year history of smoking and currently smoke or have quit within the past 15 years.  Colorectal cancer screening. All adults  should have this screening starting at age 60 and continuing until age 60. Your health care provider may recommend screening at age 60 if you are at increased risk. You will have tests every 1-10 years, depending on your results and the type of screening test.  Diabetes screening. This is done by checking your blood sugar (glucose) after you have not eaten for a while (fasting). You may have this done every 1-3 years.  Mammogram. This may be done every 1-2 years. Talk with your health care provider about when you should start having regular mammograms. This may depend on whether you have a family history of breast cancer.  BRCA-related cancer screening. This may be done if you have a family history of breast, ovarian, tubal, or peritoneal cancers.  Pelvic exam and Pap test. This may be done every 3 years starting at age 60. Starting at age 60, this may be done every 5 years if you have a Pap test in combination with an HPV test. Other tests  Sexually transmitted disease (STD) testing.  Bone density scan. This is done to screen for osteoporosis. You may have this scan if you are at high risk for osteoporosis. Follow these instructions at home: Eating and drinking  Eat a diet that includes fresh fruits and vegetables, whole grains, lean protein, and low-fat dairy.  Take vitamin and mineral supplements as recommended by your health care provider.  Do not drink alcohol if: ? Your health care provider tells you not to drink. ? You are pregnant, may be pregnant, or are planning to become pregnant.  If you drink alcohol: ? Limit how much you have to 0-1 drink a day. ? Be aware of how much alcohol is in your drink. In the [place] one drink equals one 12 oz bottle of beer (355 mL), one 5 oz glass of wine (148 mL), or one 1 oz glass of hard liquor (44 mL). Lifestyle  Take daily care of your teeth and gums.  Stay active. Exercise for at least 30 minutes on 5 or more days each week.  Do not use  any products that contain nicotine or tobacco, such as cigarettes, e-cigarettes, and chewing tobacco. If you need help quitting, ask your health care provider.  If you are sexually active, practice safe sex. Use a condom or other form of birth control (contraception) in order to prevent pregnancy and STIs (sexually transmitted infections).  If told by your health care provider, take low-dose aspirin daily starting at age 4. What's next?  Visit your health care provider once a year for a well check visit.  Ask your health care provider how often you should have your eyes and teeth checked.  Stay up to date on all vaccines. This information is not intended to replace advice given to you by your health care provider. Make sure you discuss any questions you have with your health care provider. Document Released: 03/12/2015 Document Revised: 10/25/2017 Document Reviewed: 10/25/2017 Elsevier Patient Education  2020 [employer].

## 2018-09-13 NOTE — Assessment & Plan Note (Signed)
No signs of recurrence 
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
## 2018-09-13 NOTE — Assessment & Plan Note (Signed)
Referral placed to GI 

## 2018-09-13 NOTE — Assessment & Plan Note (Signed)
Last A1c elevated to 8.9. Will add on jardiance 25mg  to regimen.  Continue metformin 1000 mg twice daily and glipizide 10 mg twice daily.

## 2018-09-13 NOTE — Assessment & Plan Note (Signed)
Patient was asked about her tobacco use today and was strongly advised to quit. Patient is currently precontemplative. We reviewed treatment options to assist her quit smoking including NRT, Chantix, and Bupropion. Follow up at next office visit.   Total time spent counseling approximately 3 minutes.

## 2018-09-13 NOTE — Assessment & Plan Note (Signed)
Stable.  No signs of volume overload.  Continue management per cardiology. 

## 2018-09-13 NOTE — Assessment & Plan Note (Signed)
Continue lipitor 20mg  daily.  Check lipid panel.

## 2018-09-13 NOTE — Assessment & Plan Note (Signed)
At goal.  Continue Coreg 6.25 mg twice daily, Entresto 97-103 twice daily, spironolactone 12.5 mg daily, and Lasix 20 mg daily.

## 2018-09-16 NOTE — Progress Notes (Signed)
Please inform patient of the following:  Her kidney numbers are up a bit - think this is mostly dehydration. Please ask pt to stay well hydrated and we should recheck again in a few weeks. Please place future BMET order  Triglycerides were a bit high but the rest of hsi cholesterol levels were normal - can continue current statin dose.   HEr B12 is very low - recommend starting protocol - this should help some with energy. HEr vitamin D was also slightly low. Recommend taking 1000IU daily and we can rechek in a 6-12 months.

## 2018-09-20 ENCOUNTER — Other Ambulatory Visit: Payer: Self-pay

## 2018-09-20 ENCOUNTER — Telehealth: Payer: Self-pay | Admitting: Physical Therapy

## 2018-09-20 DIAGNOSIS — R944 Abnormal results of kidney function studies: Secondary | ICD-10-CM

## 2018-09-20 NOTE — Telephone Encounter (Signed)
Notified patient of lab results.Patient voices understanding.Patient will call to schedule nurse visit for B 12 injections and also a lab visit for 3 weeks. Labs placed

## 2018-09-20 NOTE — Telephone Encounter (Signed)
Copied from [place] [phone]. Topic: Quick Communication - Lab Results (Clinic Use ONLY) >> Sep 20, 2018  8:04 AM Lennox Solders wrote: Pt viewed  her lab results on mychart and would like a return call concerning vit b, vit d and kidney functions results

## 2018-09-24 ENCOUNTER — Ambulatory Visit (INDEPENDENT_AMBULATORY_CARE_PROVIDER_SITE_OTHER): Payer: Medicare Other

## 2018-09-24 ENCOUNTER — Other Ambulatory Visit: Payer: Self-pay

## 2018-09-24 DIAGNOSIS — E538 Deficiency of other specified B group vitamins: Secondary | ICD-10-CM

## 2018-09-24 MED ORDER — CYANOCOBALAMIN 1000 MCG/ML IJ SOLN
1000.0000 ug | Freq: Once | INTRAMUSCULAR | Status: AC
  Administered 2018-09-24: 1000 ug via INTRAMUSCULAR

## 2018-09-24 NOTE — Progress Notes (Signed)
Per orders of Dr.Wallace  injection of vitamin B12  given by Loralyn Freshwater.Given in left deltoid.Patient tolerated injection well.

## 2018-10-01 ENCOUNTER — Ambulatory Visit: Payer: Medicare Other

## 2018-10-02 ENCOUNTER — Other Ambulatory Visit: Payer: Self-pay

## 2018-10-02 ENCOUNTER — Ambulatory Visit (INDEPENDENT_AMBULATORY_CARE_PROVIDER_SITE_OTHER): Payer: Medicare Other

## 2018-10-02 DIAGNOSIS — E538 Deficiency of other specified B group vitamins: Secondary | ICD-10-CM | POA: Diagnosis not present

## 2018-10-02 MED ORDER — CYANOCOBALAMIN 1000 MCG/ML IJ SOLN
1000.0000 ug | Freq: Once | INTRAMUSCULAR | Status: AC
  Administered 2018-10-02: 1000 ug via INTRAMUSCULAR

## 2018-10-02 NOTE — Progress Notes (Signed)
Cyanocobalamin 1000 mcg/mL, 1 mL given IM, right upper outer quadrant, pt tolerated well.

## 2018-10-03 MED ORDER — LORAZEPAM 2 MG PO TABS: ORAL_TABLET | ORAL | 0 refills | Status: DC

## 2018-10-04 ENCOUNTER — Other Ambulatory Visit (HOSPITAL_COMMUNITY): Payer: Self-pay | Admitting: Internal Medicine

## 2018-10-04 DIAGNOSIS — E785 Hyperlipidemia, unspecified: Secondary | ICD-10-CM

## 2018-10-08 ENCOUNTER — Ambulatory Visit: Payer: Medicare Other

## 2018-10-09 ENCOUNTER — Other Ambulatory Visit: Payer: Self-pay

## 2018-10-09 ENCOUNTER — Ambulatory Visit: Payer: Medicare Other

## 2018-10-09 ENCOUNTER — Ambulatory Visit (INDEPENDENT_AMBULATORY_CARE_PROVIDER_SITE_OTHER): Payer: Medicare Other

## 2018-10-09 DIAGNOSIS — E538 Deficiency of other specified B group vitamins: Secondary | ICD-10-CM | POA: Diagnosis not present

## 2018-10-09 MED ORDER — CYANOCOBALAMIN 1000 MCG/ML IJ SOLN
1000.0000 ug | Freq: Once | INTRAMUSCULAR | Status: AC
  Administered 2018-10-09: 1000 ug via INTRAMUSCULAR

## 2018-10-09 NOTE — Progress Notes (Signed)
Per orders of Dr. Jerline Pain, injection of vitamin B12 1000 mcg given in left deltoid by Gertie Exon, CMA.  Patient tolerated injection well.  She will return in 1 month for next injection.

## 2018-10-15 ENCOUNTER — Other Ambulatory Visit: Payer: Medicare Other

## 2018-10-15 ENCOUNTER — Other Ambulatory Visit (INDEPENDENT_AMBULATORY_CARE_PROVIDER_SITE_OTHER): Payer: Medicare Other

## 2018-10-15 ENCOUNTER — Other Ambulatory Visit: Payer: Self-pay

## 2018-10-15 ENCOUNTER — Ambulatory Visit (INDEPENDENT_AMBULATORY_CARE_PROVIDER_SITE_OTHER): Payer: Medicare Other

## 2018-10-15 DIAGNOSIS — E538 Deficiency of other specified B group vitamins: Secondary | ICD-10-CM

## 2018-10-15 DIAGNOSIS — R944 Abnormal results of kidney function studies: Secondary | ICD-10-CM

## 2018-10-15 LAB — BASIC METABOLIC PANEL
BUN: 28 mg/dL — ABNORMAL HIGH (ref 6–23)
CO2: 22 mEq/L (ref 19–32)
Calcium: 9.3 mg/dL (ref 8.4–10.5)
Chloride: 102 mEq/L (ref 96–112)
Creatinine, Ser: 1.56 mg/dL — ABNORMAL HIGH (ref 0.40–1.20)
GFR: 33.83 mL/min — ABNORMAL LOW (ref >60.00)
Glucose, Bld: 219 mg/dL — ABNORMAL HIGH (ref 70–99)
Potassium: 5.1 mEq/L (ref 3.5–5.1)
Sodium: 134 mEq/L — ABNORMAL LOW (ref 135–145)

## 2018-10-15 MED ORDER — CYANOCOBALAMIN 1000 MCG/ML IJ SOLN
1000.0000 ug | Freq: Once | INTRAMUSCULAR | Status: AC
  Administered 2018-10-15: 1000 ug via INTRAMUSCULAR

## 2018-10-15 NOTE — Progress Notes (Signed)
Per orders of Dr. Jerline Pain, injection of Vitamin b12, 1000 mcg given left deltoid IM by Kevan Ny, CMA Patient tolerated injection well.  Patient expressed interest that she would like to start giving herself home injections of b12.  Instructed patient on method of self-administration and advised to contact our office approx 2 wks prior to her next injection due so that Dr. Jerline Pain can send in rx and supplies for b12 home injections.  Patient verbalized understanding.

## 2018-10-15 NOTE — Progress Notes (Signed)
Please inform patient of the following:  Kidney function has slightly worsened since last month.  It is possible that some of her medications could have cause the change. Recommend referral to nephrology. Please place referral.  Caleb M. Jerline Pain, MD 10/15/2018 4:08 PM

## 2018-10-15 NOTE — Progress Notes (Signed)
I have reviewed the patient's encounter and agree with the documentation.  [name]. Jerline Pain, MD 10/15/2018 11:20 AM

## 2018-10-16 NOTE — Addendum Note (Signed)
Addended by: Gwenyth Ober R on: 10/16/2018 02:40 PM   Modules accepted: Orders

## 2018-10-17 ENCOUNTER — Other Ambulatory Visit (HOSPITAL_COMMUNITY): Payer: Self-pay

## 2018-10-17 DIAGNOSIS — E785 Hyperlipidemia, unspecified: Secondary | ICD-10-CM

## 2018-10-18 ENCOUNTER — Other Ambulatory Visit: Payer: Self-pay | Admitting: Oncology

## 2018-10-18 DIAGNOSIS — Z1231 Encounter for screening mammogram for malignant neoplasm of breast: Secondary | ICD-10-CM

## 2018-10-18 MED ORDER — ATORVASTATIN CALCIUM 20 MG PO TABS: 20.0000 mg | ORAL_TABLET | Freq: Every day | ORAL | 0 refills | Status: DC

## 2018-10-26 ENCOUNTER — Other Ambulatory Visit: Payer: Self-pay | Admitting: Oncology

## 2018-10-28 ENCOUNTER — Other Ambulatory Visit (HOSPITAL_COMMUNITY): Payer: Self-pay | Admitting: Internal Medicine

## 2018-11-14 ENCOUNTER — Telehealth: Payer: Self-pay | Admitting: Family Medicine

## 2018-11-14 ENCOUNTER — Other Ambulatory Visit: Payer: Self-pay

## 2018-11-14 MED ORDER — CYANOCOBALAMIN 1000 MCG/ML IJ SOLN: 1000.0000 ug | Freq: Once | INTRAMUSCULAR | 0 refills | Status: AC

## 2018-11-14 NOTE — Telephone Encounter (Signed)
See note  Copied from [place] [phone]. Topic: General - Inquiry >> Nov 14, 2018  1:41 PM Richardo Priest, [place] wrote: Reason for CRM: Patient called in asking if her B-12 injection can be placed in a script so she can go get it while she gets her flu shot at the pharmacy as well. Please advise.

## 2018-11-14 NOTE — Telephone Encounter (Signed)
Rx for B12 injection has been sent to pharmacy.

## 2018-11-19 LAB — HM DIABETES EYE EXAM

## 2018-11-22 ENCOUNTER — Encounter: Payer: Self-pay | Admitting: Family Medicine

## 2018-11-24 ENCOUNTER — Other Ambulatory Visit: Payer: Self-pay | Admitting: Family Medicine

## 2018-11-26 ENCOUNTER — Encounter (HOSPITAL_COMMUNITY): Payer: Self-pay

## 2018-11-26 ENCOUNTER — Other Ambulatory Visit: Payer: Self-pay

## 2018-11-26 ENCOUNTER — Ambulatory Visit (HOSPITAL_COMMUNITY)
Admission: RE | Admit: 2018-11-26 | Discharge: 2018-11-26 | Disposition: A | Discharge status: Home / Self Care | Payer: Medicare Other | Source: Ambulatory Visit | Attending: Oncology | Admitting: Oncology

## 2018-11-26 ENCOUNTER — Inpatient Hospital Stay: Payer: Medicare Other | Attending: Oncology

## 2018-11-26 DIAGNOSIS — C419 Malignant neoplasm of bone and articular cartilage, unspecified: Secondary | ICD-10-CM | POA: Insufficient documentation

## 2018-11-26 DIAGNOSIS — M818 Other osteoporosis without current pathological fracture: Secondary | ICD-10-CM | POA: Diagnosis not present

## 2018-11-26 DIAGNOSIS — Z8529 Personal history of malignant neoplasm of other respiratory and intrathoracic organs: Secondary | ICD-10-CM | POA: Diagnosis not present

## 2018-11-26 LAB — CBC WITH DIFFERENTIAL (CANCER CENTER ONLY)
Abs Immature Granulocytes: 0.03 10*3/uL (ref 0.00–0.07)
Basophils Absolute: 0 10*3/uL (ref 0.0–0.1)
Basophils Relative: 0 %
Eosinophils Absolute: 0.3 10*3/uL (ref 0.0–0.5)
Eosinophils Relative: 4 %
HCT: 37.3 % (ref 36.0–46.0)
Hemoglobin: 12.1 g/dL (ref 12.0–15.0)
Immature Granulocytes: 0 %
Lymphocytes Relative: 36 %
Lymphs Abs: 3.2 10*3/uL (ref 0.7–4.0)
MCH: 29.5 pg (ref 26.0–34.0)
MCHC: 32.4 g/dL (ref 30.0–36.0)
MCV: 91 fL (ref 80.0–100.0)
Monocytes Absolute: 0.5 10*3/uL (ref 0.1–1.0)
Monocytes Relative: 6 %
Neutro Abs: 4.7 10*3/uL (ref 1.7–7.7)
Neutrophils Relative %: 54 %
Platelet Count: 203 10*3/uL (ref 150–400)
RBC: 4.1 MIL/uL (ref 3.87–5.11)
RDW: 13.1 % (ref 11.5–15.5)
WBC Count: 8.8 10*3/uL (ref 4.0–10.5)
nRBC: 0 % (ref 0.0–0.2)

## 2018-11-26 LAB — CMP (CANCER CENTER ONLY)
ALT: 17 U/L (ref 0–44)
AST: 14 U/L — ABNORMAL LOW (ref 15–41)
Albumin: 3.8 g/dL (ref 3.5–5.0)
Alkaline Phosphatase: 59 U/L (ref 38–126)
Anion gap: 11 (ref 5–15)
BUN: 25 mg/dL — ABNORMAL HIGH (ref 6–20)
CO2: 23 mmol/L (ref 22–32)
Calcium: 8.7 mg/dL — ABNORMAL LOW (ref 8.9–10.3)
Chloride: 104 mmol/L (ref 98–111)
Creatinine: 1.79 mg/dL — ABNORMAL HIGH (ref 0.44–1.00)
GFR, Est AFR Am: 35 mL/min — ABNORMAL LOW (ref >60)
GFR, Estimated: 30 mL/min — ABNORMAL LOW (ref >60)
Glucose, Bld: 162 mg/dL — ABNORMAL HIGH (ref 70–99)
Potassium: 5.4 mmol/L — ABNORMAL HIGH (ref 3.5–5.1)
Sodium: 138 mmol/L (ref 135–145)
Total Bilirubin: <0.2 mg/dL — ABNORMAL LOW (ref 0.3–1.2)
Total Protein: 6.5 g/dL (ref 6.5–8.1)

## 2018-11-28 ENCOUNTER — Other Ambulatory Visit: Payer: Self-pay

## 2018-11-28 ENCOUNTER — Inpatient Hospital Stay: Payer: Medicare Other | Attending: Oncology | Admitting: Oncology

## 2018-11-28 VITALS — BP 110/60 | HR 99 | Temp 98.0°F | Resp 18 | Ht 64.25 in | Wt 168.2 lb

## 2018-11-28 DIAGNOSIS — Z9221 Personal history of antineoplastic chemotherapy: Secondary | ICD-10-CM | POA: Diagnosis not present

## 2018-11-28 DIAGNOSIS — M810 Age-related osteoporosis without current pathological fracture: Secondary | ICD-10-CM | POA: Diagnosis not present

## 2018-11-28 DIAGNOSIS — J219 Acute bronchiolitis, unspecified: Secondary | ICD-10-CM | POA: Diagnosis not present

## 2018-11-28 DIAGNOSIS — Z923 Personal history of irradiation: Secondary | ICD-10-CM | POA: Insufficient documentation

## 2018-11-28 DIAGNOSIS — Z8529 Personal history of malignant neoplasm of other respiratory and intrathoracic organs: Secondary | ICD-10-CM | POA: Diagnosis not present

## 2018-11-28 DIAGNOSIS — J849 Interstitial pulmonary disease, unspecified: Secondary | ICD-10-CM | POA: Insufficient documentation

## 2018-11-28 DIAGNOSIS — R911 Solitary pulmonary nodule: Secondary | ICD-10-CM | POA: Diagnosis not present

## 2018-11-28 DIAGNOSIS — C419 Malignant neoplasm of bone and articular cartilage, unspecified: Secondary | ICD-10-CM | POA: Diagnosis not present

## 2018-11-28 DIAGNOSIS — F17210 Nicotine dependence, cigarettes, uncomplicated: Secondary | ICD-10-CM | POA: Insufficient documentation

## 2018-11-28 DIAGNOSIS — Z853 Personal history of malignant neoplasm of breast: Secondary | ICD-10-CM | POA: Diagnosis not present

## 2018-11-28 NOTE — Progress Notes (Signed)
Hematology and Oncology Follow Up Visit  Emily [name] [id number] [birth year] 58 y.o. 11/28/2018 10:26 AM Emily [name], MDParker, Emily Greenhouse, MD   Principle Diagnosis: 58 year old woman with:  1. 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     2.  Stage IIIc breast cancer after presenting with 3.1 cm tumor and 5 out of 7 lymph nodes involved,  ER/PR positive HER-2 negative diagnosed in 2009.   Prior Therapy:   She was treated with Adriamycin and cyclophosphamide in a dose dense fashion and weekly Taxol. She received radiation after lumpectomy and tamoxifen and subsequently Arimidex and subsequently letrozole.   She is status post wide excision of her right chest wall leiomyosarcoma done on 12/28/2015. The pathology showed no residual malignancy.  Letrozole 2.5 mg daily since 2010.  Therapy discontinued in 2020.   Current therapy: Active surveillance.  Interim History: [name] is here for a follow-up visit.  Since the last visit, she reports no major changes in her health.  She denies any bone pain or pathological fractures.  She denies any recent hospitalizations or illnesses.  He denies any respiratory complaints.  She still smoking close to a pack a day.  Performance status and quality of life remains unchanged.   Patient denied headaches, blurry vision, syncope or seizures.  Denies any fevers, chills or sweats.  Denied chest pain, palpitation, orthopnea or leg edema.  Denied cough, wheezing or hemoptysis.  Denied nausea, vomiting or abdominal pain.  Denies any constipation or diarrhea.  Denies any frequency urgency or hesitancy.  Denies any arthralgias or myalgias.  Denies any skin rashes or lesions.  Denies any bleeding or clotting tendency.  Denies any easy bruising.  Denies any hair or nail changes.  Denies any anxiety or depression.  Remaining review of system is negative.      Medications: Updated on review. Current  Outpatient Medications  Medication Sig Dispense Refill  . albuterol (PROVENTIL HFA;VENTOLIN HFA) 108 (90 BASE) MCG/ACT inhaler Inhale 2 puffs into the lungs every 6 (six) hours as needed for wheezing or shortness of breath. 1 Inhaler 0  . aspirin (ASPIRIN 81) 81 MG chewable tablet Aspir-81    . aspirin EC 81 MG tablet Take 1 tablet (81 mg total) by mouth daily. 90 tablet 3  . atorvastatin (LIPITOR) 20 MG tablet Take 1 tablet (20 mg total) by mouth daily. 90 tablet 0  . buPROPion (WELLBUTRIN XL) 150 MG 24 hr tablet Take 1 tablet (150 mg total) by mouth daily. (Patient not taking: Reported on 10/15/2018) 30 tablet 5  . carvedilol (COREG) 6.25 MG tablet TAKE 1 & 1/2 (ONE & ONE-HALF) TABLETS BY MOUTH TWICE DAILY 270 tablet 0  . citalopram (CELEXA) 40 MG tablet Take 1 tablet (40 mg total) by mouth daily. 90 tablet 3  . empagliflozin (JARDIANCE) 25 MG TABS tablet Take 25 mg by mouth daily. 30 tablet 5  . fluorouracil (EFUDEX) 5 % cream Apply topically 2 (two) times daily.    . furosemide (LASIX) 20 MG tablet TAKE 1 TABLET BY MOUTH ONCE DAILY TAKE AN EXTRA 20 MG TABLET ONCE DAILY AS NEEDED FOR WEIGHT GAIN 3 POUNDS OR MORE OVERNIGHT 180 tablet 0  . glipiZIDE (GLUCOTROL) 10 MG tablet Take 1 tablet (10 mg total) by mouth 2 (two) times daily before a meal. 180 tablet 3  . glucose blood test strip Check blood sugar three times daily 100 each 11  . hydrOXYzine (ATARAX/VISTARIL) 25  MG tablet hydroxyzine HCl 25 mg tablet    . hydrOXYzine (ATARAX/VISTARIL) 50 MG tablet TAKE 1 TO 2 TABLETS BY MOUTH THREE TIMES DAILY AS NEEDED FOR ITCHING 90 tablet 0  . letrozole (FEMARA) 2.5 MG tablet TAKE 1 TABLET BY MOUTH IN THE MORNING 90 tablet 0  . LORazepam (ATIVAN) 2 MG tablet TAKE 1 TABLET BY MOUTH AT BEDTIME AS NEEDED FOR ANXIETY 90 tablet 0  . LORazepam (ATIVAN) 2 MG tablet lorazepam 2 mg tablet    . metFORMIN (GLUCOPHAGE) 1000 MG tablet Take 1 tablet (1,000 mg total) by mouth 2 (two) times daily with a meal. 180 tablet 3   . ondansetron (ZOFRAN ODT) 4 MG disintegrating tablet Take 1 tablet (4 mg total) by mouth every 8 (eight) hours as needed for nausea or vomiting. 10 tablet 0  . sacubitril-valsartan (ENTRESTO) 97-103 MG Take 1 tablet by mouth 2 (two) times daily. 180 tablet 3  . spironolactone (ALDACTONE) 25 MG tablet Take 1/2 (one-half) tablet by mouth once daily 45 tablet 3   No current facility-administered medications for this visit.      Allergies:  Allergies  Allergen Reactions  . Contrast Media [Iodinated Diagnostic Agents] Anaphylaxis  . Gadolinium Shortness Of Breath and Other (See Comments)     Code: SOB, Onset Date: [id number]     Past Medical History, Surgical history, Social history, and Family History without any changes on review.   Physical Exam:  Blood pressure 110/60, pulse 99, temperature 98 F (36.7 C), temperature source Oral, resp. rate 18, height 5' 4.25" (1.632 m), weight 168 lb 3.2 oz (76.3 kg), SpO2 95 %.    ECOG: 1    General appearance: Alert, awake without any distress. Head: Atraumatic without abnormalities Oropharynx: Without any thrush or ulcers. Eyes: No scleral icterus. Lymph nodes: No lymphadenopathy noted in the cervical, supraclavicular, or axillary nodes Heart:regular rate and rhythm, without any murmurs or gallops.   Lung: Clear to auscultation without any rhonchi, wheezes or dullness to percussion. Abdomin: Soft, nontender without any shifting dullness or ascites. Musculoskeletal: No clubbing or cyanosis. Neurological: No motor or sensory deficits. Skin: No rashes or lesions.  Well-healed scar noted on her right chest wall.     Lab Results: Lab Results  Component Value Date   WBC 8.8 11/26/2018   HGB 12.1 11/26/2018   HCT 37.3 11/26/2018   MCV 91.0 11/26/2018   PLT 203 11/26/2018     Chemistry      Component Value Date/Time   NA 138 11/26/2018 0824   NA 136 10/06/2016 0838   NA 134 (L) 03/22/2016 1110   K 5.4 (H) 11/26/2018 0824   K  5.1 03/22/2016 1110   CL 104 11/26/2018 0824   CL 102 06/21/2012   CO2 23 11/26/2018 0824   CO2 27 03/22/2016 1110   BUN 25 (H) 11/26/2018 0824   BUN 25 (H) 10/06/2016 0838   BUN 27.7 (H) 03/22/2016 1110   CREATININE 1.79 (H) 11/26/2018 0824   CREATININE 1.2 (H) 03/22/2016 1110      Component Value Date/Time   CALCIUM 8.7 (L) 11/26/2018 0824   CALCIUM 9.6 03/22/2016 1110   ALKPHOS 59 11/26/2018 0824   ALKPHOS 90 03/22/2016 1110   AST 14 (L) 11/26/2018 0824   AST 13 03/22/2016 1110   ALT 17 11/26/2018 0824   ALT 15 03/22/2016 1110   BILITOT <0.2 (L) 11/26/2018 0824   BILITOT 0.27 03/22/2016 1110       EXAM: CT CHEST WITHOUT CONTRAST  TECHNIQUE: Multidetector CT imaging of the chest was performed following the standard protocol without IV contrast.  COMPARISON:  04/02/2018  FINDINGS: Cardiovascular: Normal heart size. Aortic atherosclerosis. Three vessel coronary artery calcifications.  Mediastinum/Nodes: No enlarged mediastinal or axillary lymph nodes. Thyroid gland, trachea, and esophagus demonstrate no significant findings. Stable postoperative change with dystrophic calcifications in the lateral right breast. No enlarged mediastinal or hilar lymph nodes.  Lungs/Pleura: No pleural effusion. No airspace consolidation or pneumothorax. Diffuse bilateral centrilobular ground-glass densities are identified which is most likely reflects smoking related respiratory bronchiolitis interstitial lung disease, RB ILD. 3 mm right lower lobe lung nodule is unchanged, image 77/7. Stable subpleural scar like density within the lateral left lower lobe, image 103/7.  Upper Abdomen: No acute abnormality.  Musculoskeletal: No chest wall mass or suspicious bone lesions identified. Multiple chronic right lateral rib deformities are again noted. Previously noted hyperattenuating pleural base nodule overlying the right upper lobe has resolved in the interval. No aggressive  lytic or sclerotic bone lesions.  IMPRESSION: 1. Resolution of previous 8 mm pleural nodule overlying the right upper lobe adjacent to nondisplaced third rib fracture. 2. Unchanged appearance of small right lower lobe lung nodule. 3. Chronic interstitial changes of smoking related respiratory bronchiolitis-interstitial lung disease, RB ILD. 4.  Aortic Atherosclerosis (ICD10-I70.0).   Impression and Plan:  58 year old woman with:   1. Leiomyosarcoma of the chest wall that was resected and remains disease-free since 2017.    The natural course of this disease was reviewed today.  Risk of relapse was also assessed at this time.  CT scan obtained on 11/26/2018 was personally reviewed.  No evidence of relapsed disease noted at this time.   2.  Breast cancer diagnosed in 2009.  She was found to have stage IIIC and continues to be in remission.  She has completed 10 years of adjuvant hormone therapy and Femara was discontinued.  The natural course of this disease and risk of relapse was discussed and at this time I recommended continued active surveillance with mammography as she is doing.  She is scheduled for her next mammogram in the near future.    3. Osteoporosis: I recommended repeating DEXA scan in the future this has not been scheduled at this time.  4. Follow-up: She will return in 6 months and repeat imaging studies in 12 months.  15  minutes was spent with the patient face-to-face today.  More than 50% of time was dedicated to reviewing her disease status, treatment options and answering questions regarding future plan of care.   Zola Button, MD 10/1/202010:26 AM

## 2018-12-04 ENCOUNTER — Other Ambulatory Visit: Payer: Self-pay

## 2018-12-04 ENCOUNTER — Ambulatory Visit
Admission: RE | Admit: 2018-12-04 | Discharge: 2018-12-04 | Disposition: A | Discharge status: Home / Self Care | Payer: Medicare Other | Source: Ambulatory Visit | Attending: Oncology | Admitting: Oncology

## 2018-12-04 DIAGNOSIS — Z1231 Encounter for screening mammogram for malignant neoplasm of breast: Secondary | ICD-10-CM

## 2018-12-18 ENCOUNTER — Ambulatory Visit (INDEPENDENT_AMBULATORY_CARE_PROVIDER_SITE_OTHER): Payer: Medicare Other

## 2018-12-18 ENCOUNTER — Ambulatory Visit: Payer: Medicare Other

## 2018-12-18 ENCOUNTER — Other Ambulatory Visit: Payer: Self-pay

## 2018-12-18 VITALS — BP 108/62 | Temp 98.0°F | Ht 64.0 in | Wt 168.8 lb

## 2018-12-18 DIAGNOSIS — Z0000 Encounter for general adult medical examination without abnormal findings: Secondary | ICD-10-CM | POA: Diagnosis not present

## 2018-12-18 DIAGNOSIS — E538 Deficiency of other specified B group vitamins: Secondary | ICD-10-CM

## 2018-12-18 MED ORDER — CYANOCOBALAMIN 1000 MCG/ML IJ SOLN
1000.0000 ug | Freq: Once | INTRAMUSCULAR | Status: AC
  Administered 2018-12-18: 1000 ug via INTRAMUSCULAR

## 2018-12-18 NOTE — Patient Instructions (Addendum)
Emily [name] , Thank you for taking time to come for your Medicare Wellness Visit. I appreciate your ongoing commitment to your health goals. Please review the following plan we discussed and let me know if I can assist you in the future.   Screening recommendations/referrals: Colorectal Screening: recommended; we will check into problems with referral  Mammogram: up to date; last 12/04/18 Bone Density: up to date; last 10/05/14  Vision and Dental Exams: Recommended annual ophthalmology exams for early detection of glaucoma and other disorders of the eye Recommended annual dental exams for proper oral hygiene  Diabetic Exams: Diabetic Eye Exam: yearly; up to date Diabetic Foot Exam: yearly; up to date  Vaccinations: Influenza vaccine: completed 11/15/18 Pneumococcal vaccine: up to date; last 08/06/15 Tdap vaccine: up to date;last 11/15/12 Shingles vaccine: Please call your insurance company to determine your out of pocket expense for the Shingrix vaccine. You may receive this vaccine at your local pharmacy.  Advanced directives: We have received a copy of your POA (Power of [place]) and/or Living Will. These documents can be located in your chart.  Goals: Recommend to drink at least 6-8 8oz glasses of water per day and consume a balanced diet rich in fresh fruits and vegetables.    Next appointment: Please schedule your Annual Wellness Visit with your Nurse Health Advisor in one year. Please schedule a January follow up with Dr. Jerline Pain  Preventive Care 65 Years and Older, Female Preventive care refers to lifestyle choices and visits with your health care provider that can promote health and wellness. What does preventive care include?  A yearly physical exam. This is also called an annual well check.  Dental exams once or twice a year.  Routine eye exams. Ask your health care provider how often you should have your eyes checked.  Personal lifestyle choices, including:  Daily care of  your teeth and gums.  Regular physical activity.  Eating a healthy diet.  Avoiding tobacco and drug use.  Limiting alcohol use.  Practicing safe sex.  Taking low-dose aspirin every day if recommended by your health care provider.  Taking vitamin and mineral supplements as recommended by your health care provider. What happens during an annual well check? The services and screenings done by your health care provider during your annual well check will depend on your age, overall health, lifestyle risk factors, and family history of disease. Counseling  Your health care provider may ask you questions about your:  Alcohol use.  Tobacco use.  Drug use.  Emotional well-being.  Home and relationship well-being.  Sexual activity.  Eating habits.  History of falls.  Memory and ability to understand (cognition).  Work and work Statistician.  Reproductive health. Screening  You may have the following tests or measurements:  Height, weight, and BMI.  Blood pressure.  Lipid and cholesterol levels. These may be checked every 5 years, or more frequently if you are over 75 years old.  Skin check.  Lung cancer screening. You may have this screening every year starting at age 23 if you have a 30-pack-year history of smoking and currently smoke or have quit within the past 15 years.  Fecal occult blood test (FOBT) of the stool. You may have this test every year starting at age 2.  Flexible sigmoidoscopy or colonoscopy. You may have a sigmoidoscopy every 5 years or a colonoscopy every 10 years starting at age 39.  Hepatitis C blood test.  Hepatitis B blood test.  Sexually transmitted disease (STD) testing.  Diabetes screening. This is done by checking your blood sugar (glucose) after you have not eaten for a while (fasting). You may have this done every 1-3 years.  Bone density scan. This is done to screen for osteoporosis. You may have this done starting at age  81.  Mammogram. This may be done every 1-2 years. Talk to your health care provider about how often you should have regular mammograms. Talk with your health care provider about your test results, treatment options, and if necessary, the need for more tests. Vaccines  Your health care provider may recommend certain vaccines, such as:  Influenza vaccine. This is recommended every year.  Tetanus, diphtheria, and acellular pertussis (Tdap, Td) vaccine. You may need a Td booster every 10 years.  Zoster vaccine. You may need this after age 38.  Pneumococcal 13-valent conjugate (PCV13) vaccine. One dose is recommended after age 1.  Pneumococcal polysaccharide (PPSV23) vaccine. One dose is recommended after age 87. Talk to your health care provider about which screenings and vaccines you need and how often you need them. This information is not intended to replace advice given to you by your health care provider. Make sure you discuss any questions you have with your health care provider. Document Released: 03/12/2015 Document Revised: 11/03/2015 Document Reviewed: 12/15/2014 Elsevier Interactive Patient Education  2017 [place] Prevention in the Home Falls can cause injuries. They can happen to people of all ages. There are many things you can do to make your home safe and to help prevent falls. What can I do on the outside of my home?  Regularly fix the edges of walkways and driveways and fix any cracks.  Remove anything that might make you trip as you walk through a door, such as a raised step or threshold.  Trim any bushes or trees on the path to your home.  Use bright outdoor lighting.  Clear any walking paths of anything that might make someone trip, such as rocks or tools.  Regularly check to see if handrails are loose or broken. Make sure that both sides of any steps have handrails.  Any raised decks and porches should have guardrails on the edges.  Have any  leaves, snow, or ice cleared regularly.  Use sand or salt on walking paths during winter.  Clean up any spills in your garage right away. This includes oil or grease spills. What can I do in the bathroom?  Use night lights.  Install grab bars by the toilet and in the tub and shower. Do not use towel bars as grab bars.  Use non-skid mats or decals in the tub or shower.  If you need to sit down in the shower, use a plastic, non-slip stool.  Keep the floor dry. Clean up any water that spills on the floor as soon as it happens.  Remove soap buildup in the tub or shower regularly.  Attach bath mats securely with double-sided non-slip rug tape.  Do not have throw rugs and other things on the floor that can make you trip. What can I do in the bedroom?  Use night lights.  Make sure that you have a light by your bed that is easy to reach.  Do not use any sheets or blankets that are too big for your bed. They should not hang down onto the floor.  Have a firm chair that has side arms. You can use this for support while you get dressed.  Do not have throw  rugs and other things on the floor that can make you trip. What can I do in the kitchen?  Clean up any spills right away.  Avoid walking on wet floors.  Keep items that you use a lot in easy-to-reach places.  If you need to reach something above you, use a strong step stool that has a grab bar.  Keep electrical cords out of the way.  Do not use floor polish or wax that makes floors slippery. If you must use wax, use non-skid floor wax.  Do not have throw rugs and other things on the floor that can make you trip. What can I do with my stairs?  Do not leave any items on the stairs.  Make sure that there are handrails on both sides of the stairs and use them. Fix handrails that are broken or loose. Make sure that handrails are as long as the stairways.  Check any carpeting to make sure that it is firmly attached to the stairs.  Fix any carpet that is loose or worn.  Avoid having throw rugs at the top or bottom of the stairs. If you do have throw rugs, attach them to the floor with carpet tape.  Make sure that you have a light switch at the top of the stairs and the bottom of the stairs. If you do not have them, ask someone to add them for you. What else can I do to help prevent falls?  Wear shoes that:  Do not have high heels.  Have rubber bottoms.  Are comfortable and fit you well.  Are closed at the toe. Do not wear sandals.  If you use a stepladder:  Make sure that it is fully opened. Do not climb a closed stepladder.  Make sure that both sides of the stepladder are locked into place.  Ask someone to hold it for you, if possible.  Clearly mark and make sure that you can see:  Any grab bars or handrails.  First and last steps.  Where the edge of each step is.  Use tools that help you move around (mobility aids) if they are needed. These include:  Canes.  Walkers.  Scooters.  Crutches.  Turn on the lights when you go into a dark area. Replace any light bulbs as soon as they burn out.  Set up your furniture so you have a clear path. Avoid moving your furniture around.  If any of your floors are uneven, fix them.  If there are any pets around you, be aware of where they are.  Review your medicines with your doctor. Some medicines can make you feel dizzy. This can increase your chance of falling. Ask your doctor what other things that you can do to help prevent falls. This information is not intended to replace advice given to you by your health care provider. Make sure you discuss any questions you have with your health care provider. Document Released: 12/10/2008 Document Revised: 07/22/2015 Document Reviewed: 03/20/2014 Elsevier Interactive Patient Education  2017 [employer].
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
## 2018-12-19 MED ORDER — LORAZEPAM 2 MG PO TABS: ORAL_TABLET | ORAL | 0 refills | Status: DC

## 2018-12-19 NOTE — Addendum Note (Signed)
Addended by: [name] on: 12/19/2018 08:47 AM   Modules accepted: Orders

## 2018-12-19 NOTE — Progress Notes (Signed)
I have personally reviewed the Medicare Annual Wellness Visit and agree with the assessment and plan.  [name]. Jerline Pain, MD 12/19/2018 8:46 AM

## 2018-12-20 ENCOUNTER — Other Ambulatory Visit (HOSPITAL_COMMUNITY): Payer: Self-pay | Admitting: Adult Health

## 2018-12-25 ENCOUNTER — Other Ambulatory Visit (HOSPITAL_COMMUNITY): Payer: Self-pay

## 2018-12-25 MED ORDER — ENTRESTO 97-103 MG PO TABS: 1.0000 | ORAL_TABLET | Freq: Two times a day (BID) | ORAL | 3 refills | Status: DC

## 2018-12-30 ENCOUNTER — Encounter (HOSPITAL_COMMUNITY): Payer: Self-pay | Admitting: Internal Medicine

## 2018-12-30 ENCOUNTER — Other Ambulatory Visit: Payer: Self-pay

## 2018-12-30 ENCOUNTER — Ambulatory Visit (HOSPITAL_COMMUNITY)
Admission: RE | Admit: 2018-12-30 | Discharge: 2018-12-30 | Disposition: A | Discharge status: Home / Self Care | Payer: Medicare Other | Source: Ambulatory Visit | Attending: Internal Medicine | Admitting: Internal Medicine

## 2018-12-30 VITALS — BP 88/1 | HR 93 | Wt 170.2 lb

## 2018-12-30 DIAGNOSIS — I2510 Atherosclerotic heart disease of native coronary artery without angina pectoris: Secondary | ICD-10-CM | POA: Diagnosis not present

## 2018-12-30 DIAGNOSIS — Z720 Tobacco use: Secondary | ICD-10-CM

## 2018-12-30 DIAGNOSIS — F17200 Nicotine dependence, unspecified, uncomplicated: Secondary | ICD-10-CM | POA: Diagnosis not present

## 2018-12-30 DIAGNOSIS — Z853 Personal history of malignant neoplasm of breast: Secondary | ICD-10-CM | POA: Diagnosis not present

## 2018-12-30 DIAGNOSIS — E119 Type 2 diabetes mellitus without complications: Secondary | ICD-10-CM | POA: Insufficient documentation

## 2018-12-30 DIAGNOSIS — I110 Hypertensive heart disease with heart failure: Secondary | ICD-10-CM | POA: Insufficient documentation

## 2018-12-30 DIAGNOSIS — I428 Other cardiomyopathies: Secondary | ICD-10-CM | POA: Insufficient documentation

## 2018-12-30 DIAGNOSIS — Z79899 Other long term (current) drug therapy: Secondary | ICD-10-CM | POA: Insufficient documentation

## 2018-12-30 DIAGNOSIS — I5022 Chronic systolic (congestive) heart failure: Secondary | ICD-10-CM | POA: Insufficient documentation

## 2018-12-30 DIAGNOSIS — Z7982 Long term (current) use of aspirin: Secondary | ICD-10-CM | POA: Insufficient documentation

## 2018-12-30 DIAGNOSIS — J449 Chronic obstructive pulmonary disease, unspecified: Secondary | ICD-10-CM | POA: Diagnosis not present

## 2018-12-30 DIAGNOSIS — Z7984 Long term (current) use of oral hypoglycemic drugs: Secondary | ICD-10-CM | POA: Insufficient documentation

## 2018-12-30 DIAGNOSIS — I6523 Occlusion and stenosis of bilateral carotid arteries: Secondary | ICD-10-CM

## 2018-12-30 DIAGNOSIS — E785 Hyperlipidemia, unspecified: Secondary | ICD-10-CM | POA: Diagnosis not present

## 2018-12-30 MED ORDER — FUROSEMIDE 20 MG PO TABS: 20.0000 mg | ORAL_TABLET | Freq: Every day | ORAL | 0 refills | Status: DC | PRN

## 2018-12-30 NOTE — Progress Notes (Signed)
Patient ID: Emily [name], female   DOB: [birth year], 58 y.o.   MRN: [id number]  ADVANCED HF CLINIC NOTE  PCP: Dr Kathlen Mody Primary Cardiologist: Dr. Haroldine Laws Oncologist: Dr Tami Lin   Emily [name] is a 58 yo woman with a history of ER + R breast cancer s/p R lumpectomy, DM2, cholelithiasis s/p cholecystectomy (02/2013), COPD and systolic CHF due to nonischemic cardiomyopathy.  Patient was admitted in 09/2012 with acute pulmonary edema and cardiogenic shock.  LHC showed moderate nonobstructive CAD (40-50% mLAD, 50-70% LCx, 50% PDA).  She was started on milrinone and diuresed.  Cause of cardiomyopathy suspected to be Adriamycin toxicity.    12/05/12 Carotid Dopplers- R 39 % stenosis and L  40-59% stenosis  Had surgery 4/17 in [place] to remove R chest leiomyosarcoma. Margins not clear so underwent repeat resection here with Dr. Barry Dienes.    Had pancreatitis in 5/19  Admitted May 2017 for recurrent HF symptome. Found to have EF 10-15% with NYHA IIIB-IV symptoms. Was admitted. UDS + cocaine. Cath showed non-obstructive CAD with Cardiogenic shock initial PA sat 34%, 42%. Started on milrinone. Diuresed about 5 pounds. Milrinone weaned off and co-ox dropped down to 52% but was feeling ok.   Today she returns for HF follow up.Overall feeling fine. Limited by back pain.  Remains SOB with exertion. Continues to smoke 1PPD. Denies PND/Orthopnea. Appetite ok. No fever or chills. Weight at home 170 pounds. Taking all medications.  Echo 10/27/12 EF 20-25% Echo 01/09/13 EF 30-35% Echo 06/05/13 EF 45-50% septum dysynnergic Echo 5/17: EF 15-20% Echo 12/01/15 LVEF 55-60% Echo 02/2017 EF 40-45%. Echo 02/2018 EF 55-60%   Cath 07/14/15 Ao = 106/72 (87) LV = 108/28 (36) RA = 22 RV = 60/14/25 RA = 67/22 (51) PCW = 31 Fick cardiac output/index = 2.8/1.6 SVR = 1823 PVR = 7.0 WU FA sat = 90% PA sat = 34%, 42%  Assessment: 1. Mild non-obstructive CAD 2. Severe NICM with EF 25% by echo 3. Cardiogenic shock physiology  with markedly elevated biventricular filling pressures Lexiscan 09/23/13: EF 50% normal perfusion Holter: SR occasional PVCs  PMH: 1. COPD: PFTs (1/14) with FEV1 68%.  Quit smoking 9/14.  2. Type II diabetes  3. HTN 4. Breast cancer: 2009, s/p Adriamycin/Cytoxan/Taxol chemo, lumpectomy and radiation.   5. Nonischemic cardiomyopathy: Possibly due to Adriamycin.  Echo (8/14) with EF 20-25%, global hypokinesis, moderate diastolic dysfunction, normal RV size and with mild to moderately decreased systolic function, moderate TR.   6. CAD: LHC (8/14) with nonobstructive CAD; 40-50% mLAD, 50-70% LCx, 50% PDA.  7. Hyperlipidemia  SH:   Mother is next door.  Still smoking.   FH: No premature CAD.  No family history of cardiomyopathy.   Review of systems complete and found to be negative unless listed in HPI.    Current Outpatient Medications  Medication Sig Dispense Refill  . albuterol (PROVENTIL HFA;VENTOLIN HFA) 108 (90 BASE) MCG/ACT inhaler Inhale 2 puffs into the lungs every 6 (six) hours as needed for wheezing or shortness of breath. 1 Inhaler 0  . aspirin EC 81 MG tablet Take 1 tablet (81 mg total) by mouth daily. 90 tablet 3  . atorvastatin (LIPITOR) 20 MG tablet Take 1 tablet (20 mg total) by mouth daily. 90 tablet 0  . carvedilol (COREG) 6.25 MG tablet TAKE 1 & 1/2 (ONE & ONE-HALF) TABLETS BY MOUTH TWICE DAILY 270 tablet 0  . citalopram (CELEXA) 40 MG tablet Take 1 tablet (40 mg total) by mouth daily. 90 tablet 3  .  cyanocobalamin (,VITAMIN B-12,) 1000 MCG/ML injection Inject 1,000 mcg into the muscle every 30 (thirty) days.     . fluorouracil (EFUDEX) 5 % cream Apply topically 2 (two) times daily.    . furosemide (LASIX) 20 MG tablet TAKE 1 TABLET BY MOUTH ONCE DAILY TAKE AN EXTRA TABLET ONCE DAILY AS NEEDED FOR WEIGHT GAIN 3 POUNDS OR MORE OVERNIGHT 180 tablet 0  . glipiZIDE (GLUCOTROL) 10 MG tablet Take 1 tablet (10 mg total) by mouth 2 (two) times daily before a meal. 180 tablet 3  .  glucose blood test strip Check blood sugar three times daily 100 each 11  . hydrOXYzine (ATARAX/VISTARIL) 50 MG tablet TAKE 1 TO 2 TABLETS BY MOUTH THREE TIMES DAILY AS NEEDED FOR ITCHING 90 tablet 0  . LORazepam (ATIVAN) 2 MG tablet TAKE 1 TABLET BY MOUTH AT BEDTIME AS NEEDED FOR ANXIETY 90 tablet 0  . metFORMIN (GLUCOPHAGE) 1000 MG tablet Take 1 tablet (1,000 mg total) by mouth 2 (two) times daily with a meal. 180 tablet 3  . ondansetron (ZOFRAN ODT) 4 MG disintegrating tablet Take 1 tablet (4 mg total) by mouth every 8 (eight) hours as needed for nausea or vomiting. 10 tablet 0  . sacubitril-valsartan (ENTRESTO) 97-103 MG Take 1 tablet by mouth 2 (two) times daily. 180 tablet 3  . spironolactone (ALDACTONE) 25 MG tablet Take 1/2 (one-half) tablet by mouth once daily 45 tablet 3   No current facility-administered medications for this encounter.     Vitals:   12/30/18 1131 12/30/18 1201  BP: (!) 80/60 (!) 88/1  Pulse: 93   SpO2: 93%   Weight: 77.2 kg (170 lb 3.2 oz)    Wt Readings from Last 3 Encounters:  12/30/18 77.2 kg (170 lb 3.2 oz)  12/18/18 76.6 kg (168 lb 12.8 oz)  11/28/18 76.3 kg (168 lb 3.2 oz)    General:  Well appearing. No resp difficulty HEENT: normal Neck: supple. no JVD. Carotids 2+ bilat; no bruits. No lymphadenopathy or thryomegaly appreciated. Cor: PMI nondisplaced. Regular rate & rhythm. No rubs, gallops or murmurs. Lungs: clear Abdomen: soft, nontender, nondistended. No hepatosplenomegaly. No bruits or masses. Good bowel sounds. Extremities: no cyanosis, clubbing, rash, edema Neuro: alert & orientedx3, cranial nerves grossly intact. moves all 4 extremities w/o difficulty. Affect pleasant   Assessment/Plan:  1. Chronic systolic CHF: NICM, likely Adriamycin toxicity, - Recent ECHO 02/2017 EF 40-45%. - Echo 02/2018 EF 55-60% NYHA III but not HF likely COPD - Volume status low. Change lasix to as needed.  - Continue Entresto 97/103.  -Continue spiro 12.5 mg  daily. Unable to tolerate 25 daily due to hyperK - Continue carvedilol to 6.25 BID  - Recent BMET stable.   2. CAD: Moderate nonobstructive disease. - No s/s ischemia. Continue ASA and statin 3. COPD/tobacco use -Smoking. Again discussed need for smoking cessation. 4. Cocaine use - Remains.Abstinent 5. Breast Cancer: - Previously received adriamycin. Cancer free for > 5 years. No change. 6. Carotid stenosis:  - Asymptomatic.  - Mild 1-39% 2/20  On statin.  7. R chest leiomyosarcoma: - Had further resections and margins now clear.  Sees Dr. Alen Blew and Dr. Barry Dienes. Now thought to be a hematoma.  8. DM2 - consider Jardiance in future 9. Tobacco Abuse Discussed smoking cessation.    Follow up in 6 months,   Darrick Grinder, NP  12:43 PM   Patient seen and examined with the above-signed Advanced Practice Provider and/or Housestaff. I personally reviewed laboratory data, imaging studies  and relevant notes. I independently examined the patient and formulated the important aspects of the plan. I have edited the note to reflect any of my changes or salient points. I have personally discussed the plan with the patient and/or family.  She is stable from HF perspective. EF has recovered. Volume status looks good on exam. No s3. Lung sounds diminished without wheeze. No edema. Agree with cutting back lasix to prn. Discussed need for smoking cessation. Continue HF regimen.   Glori Bickers, MD  2:33 PM

## 2018-12-30 NOTE — Patient Instructions (Signed)
Decrease Furosemide to AS NEEDED  We will contact you in 6 months to schedule your next appointment.

## 2019-01-02 ENCOUNTER — Other Ambulatory Visit (HOSPITAL_COMMUNITY): Payer: Self-pay | Admitting: Internal Medicine

## 2019-01-09 LAB — COLOGUARD: Cologuard: NEGATIVE

## 2019-01-15 ENCOUNTER — Other Ambulatory Visit: Payer: Self-pay | Admitting: Family Medicine

## 2019-01-17 ENCOUNTER — Telehealth (HOSPITAL_COMMUNITY): Payer: Self-pay | Admitting: Pharmacy Technician

## 2019-01-17 NOTE — Telephone Encounter (Signed)
Spoke to patient regarding re-enrollment of Entresto in [employer]. Will mail the application Monday for the patient to sign. Patient is aware that the foundation will tell us exactly how much will need to be spent OOP next year in order to qualify.  Will follow up.  Charlann Boxer, CPhT

## 2019-02-03 NOTE — Telephone Encounter (Signed)
Once provider signature is obtained, will send the application to the foundation.  Will follow up.  Charlann Boxer, CPhT

## 2019-02-12 ENCOUNTER — Other Ambulatory Visit: Payer: Self-pay | Admitting: Family Medicine

## 2019-02-17 ENCOUNTER — Ambulatory Visit: Payer: Self-pay | Admitting: *Deleted

## 2019-02-17 ENCOUNTER — Telehealth: Payer: Self-pay | Admitting: *Deleted

## 2019-02-17 NOTE — Telephone Encounter (Signed)
See note

## 2019-02-17 NOTE — Telephone Encounter (Signed)
  Patient is calling to report she has been having arm pain and hand arm tingling for 2-4 weeks now. She has history of back pain and she has pain when using arm for household chores.  Reason for Disposition . Numbness (i.e., loss of sensation) in hand or fingers  Answer Assessment - Initial Assessment Questions 1. ONSET: "When did the pain start?"     Left arm pain- over 2 weeks 2. LOCATION: "Where is the pain located?"     Hand and lower arm- tingling- also upper arm in muscle area 3. PAIN: "How bad is the pain?" (Scale 1-10; or mild, moderate, severe)   - MILD (1-3): doesn't interfere with normal activities   - MODERATE (4-7): interferes with normal activities (e.g., work or school) or awakens from sleep   - SEVERE (8-10): excruciating pain, unable to do any normal activities, unable to hold a cup of water     Pain with laying down- moderate 4. WORK OR EXERCISE: "Has there been any recent work or exercise that involved this part of the body?"     no 5. CAUSE: "What do you think is causing the arm pain?"     B12 shot in that arm, flu vaccine 6. OTHER SYMPTOMS: "Do you have any other symptoms?" (e.g., neck pain, swelling, rash, fever, numbness, weakness)    weakness in the arm and numbness in the hand 7. PREGNANCY: "Is there any chance you are pregnant?" "When was your last menstrual period?"     n/a  Protocols used: ARM PAIN-A-AH

## 2019-02-17 NOTE — Telephone Encounter (Signed)
Error

## 2019-02-18 ENCOUNTER — Ambulatory Visit (INDEPENDENT_AMBULATORY_CARE_PROVIDER_SITE_OTHER): Payer: Medicare Other | Admitting: Family Medicine

## 2019-02-18 ENCOUNTER — Other Ambulatory Visit: Payer: Self-pay

## 2019-02-18 ENCOUNTER — Encounter: Payer: Self-pay | Admitting: Family Medicine

## 2019-02-18 VITALS — BP 124/72 | HR 84 | Temp 98.6°F | Ht 64.0 in | Wt 176.2 lb

## 2019-02-18 DIAGNOSIS — M79602 Pain in left arm: Secondary | ICD-10-CM | POA: Diagnosis not present

## 2019-02-18 DIAGNOSIS — E538 Deficiency of other specified B group vitamins: Secondary | ICD-10-CM

## 2019-02-18 MED ORDER — CYANOCOBALAMIN 1000 MCG/ML IJ SOLN
1000.0000 ug | Freq: Once | INTRAMUSCULAR | Status: AC
  Administered 2019-02-18: 13:00:00 1000 ug via INTRAMUSCULAR

## 2019-02-18 MED ORDER — CYCLOBENZAPRINE HCL 10 MG PO TABS: 10.0000 mg | ORAL_TABLET | Freq: Three times a day (TID) | ORAL | 0 refills | Status: DC | PRN

## 2019-02-18 MED ORDER — DICLOFENAC SODIUM 75 MG PO TBEC: 75.0000 mg | DELAYED_RELEASE_TABLET | Freq: Two times a day (BID) | ORAL | 0 refills | Status: DC

## 2019-02-18 NOTE — Telephone Encounter (Signed)
FYI

## 2019-02-18 NOTE — Progress Notes (Signed)
   Chief Complaint:  Emily [name] is a 58 y.o. female who presents today with a chief complaint of arm pain.   Assessment/Plan:  Arm Pain Consistent with cervical radiculopathy.  Has some evidence for trapezius strain as well.  Will start diclofenac and Flexeril.  Discussed home exercises.  We will follow-up me in a couple of weeks.  B12 Deficiency Injection given today.      Subjective:  HPI:  Arm Pain Started 2-4 weeks ago. Located in left arm.  Has been stable over the last 2 weeks.  Took some of her mother's Flexeril which is helped.  Some pain in her left shoulder and neck.  Associated with numbness and tingling.  Worse with certain motions.  Worse with sleeping on the area.  Tried taking Advil with modest improvement.  No other aggravating or alleviating factors.  ROS: Per HPI  PMH: She reports that she has been smoking cigarettes. She has a 40.00 pack-year smoking history. She has never used smokeless tobacco. She reports current alcohol use of about 6.0 standard drinks of alcohol per week. She reports that she does not use drugs.      Objective:  Physical Exam: BP 124/72   Pulse 84   Temp 98.6 F (37 C)   Ht 5\' 4"  (1.626 m)   Wt 176 lb 4 oz (79.9 kg)   SpO2 98%   BMI 30.25 kg/m   Wt Readings from Last 3 Encounters:  02/18/19 176 lb 4 oz (79.9 kg)  12/30/18 170 lb 3.2 oz (77.2 kg)  12/18/18 168 lb 12.8 oz (76.6 kg)  Gen: NAD, resting comfortably CV: Regular rate and rhythm with no murmurs appreciated Pulm: Normal work of breathing, clear to auscultation bilaterally with no crackles, wheezes, or rhonchi MSK: Left upper extremity with no deformities.  Negative Tinel's sign at wrist and medial epicondyle.  Full range of motion throughout.  Neurovascular intact distally.  Pain elicited with resisted supraspinatus testing.  Spurling test mildly positive on left.  [name]. Jerline Pain, MD 02/18/2019 1:30 PM

## 2019-02-18 NOTE — Patient Instructions (Addendum)
It was very nice to see you today!  I think you have a strained muscle in your neck.  This is pinching a nerve and causing your symptoms.  Please start the diclofenac and Flexeril.  Please work on gentle stretches for the next couple of weeks.  Let us know if your symptoms are worsening or not improving in the next 1 to 2 weeks.  Take care, Dr Jerline Pain  Please try these tips to maintain a healthy lifestyle:   Eat at least 3 REAL meals and 1-2 snacks per day.  Aim for no more than 5 hours between eating.  If you eat breakfast, please do so within one hour of getting up.    Each meal should contain half fruits/vegetables, one quarter protein, and one quarter carbs (no bigger than a computer mouse)   Cut down on sweet beverages. This includes juice, soda, and sweet tea.     Drink at least 1 glass of water with each meal and aim for at least 8 glasses per day   Exercise at least 150 minutes every week.

## 2019-03-10 ENCOUNTER — Other Ambulatory Visit: Payer: Self-pay

## 2019-03-11 ENCOUNTER — Ambulatory Visit: Payer: Medicare Other | Admitting: Family Medicine

## 2019-03-18 NOTE — Telephone Encounter (Addendum)
Called and spoke with [employer], they are in the benefits investigation portion of the application process. There is no prior approval needed at this time to send to them. They will verify what the co-pay is and then make a determination.  Called and gave patient a status update. Advised her that if she gets low on Entresto, she can contact [employer] for an override and they will provide her a refill during the re-enrollment period. She has plenty of Entresto right now though. Will follow back up.  Charlann Boxer, CPhT

## 2019-03-20 ENCOUNTER — Ambulatory Visit (INDEPENDENT_AMBULATORY_CARE_PROVIDER_SITE_OTHER): Payer: Medicare Other | Admitting: Family Medicine

## 2019-03-20 ENCOUNTER — Encounter: Payer: Self-pay | Admitting: Family Medicine

## 2019-03-20 ENCOUNTER — Other Ambulatory Visit: Payer: Self-pay

## 2019-03-20 VITALS — BP 110/76 | HR 100 | Temp 97.4°F | Ht 64.0 in | Wt 172.0 lb

## 2019-03-20 DIAGNOSIS — E559 Vitamin D deficiency, unspecified: Secondary | ICD-10-CM | POA: Diagnosis not present

## 2019-03-20 DIAGNOSIS — M5412 Radiculopathy, cervical region: Secondary | ICD-10-CM | POA: Diagnosis not present

## 2019-03-20 DIAGNOSIS — E538 Deficiency of other specified B group vitamins: Secondary | ICD-10-CM

## 2019-03-20 DIAGNOSIS — G4700 Insomnia, unspecified: Secondary | ICD-10-CM

## 2019-03-20 DIAGNOSIS — E1149 Type 2 diabetes mellitus with other diabetic neurological complication: Secondary | ICD-10-CM

## 2019-03-20 DIAGNOSIS — K0889 Other specified disorders of teeth and supporting structures: Secondary | ICD-10-CM

## 2019-03-20 LAB — VITAMIN B12: Vitamin B-12: 365 pg/mL (ref 211–911)

## 2019-03-20 LAB — POCT GLYCOSYLATED HEMOGLOBIN (HGB A1C): Hemoglobin A1C: 8 % — AB (ref 4.0–5.6)

## 2019-03-20 LAB — VITAMIN D 25 HYDROXY (VIT D DEFICIENCY, FRACTURES): VITD: 26.9 ng/mL — ABNORMAL LOW (ref 30.00–100.00)

## 2019-03-20 MED ORDER — LORAZEPAM 2 MG PO TABS: ORAL_TABLET | ORAL | 0 refills | Status: DC

## 2019-03-20 MED ORDER — AMOXICILLIN 875 MG PO TABS: 875.0000 mg | ORAL_TABLET | Freq: Two times a day (BID) | ORAL | 0 refills | Status: DC

## 2019-03-20 NOTE — Assessment & Plan Note (Signed)
A1c improved to 8.0.  We will continue Metformin 1000 mg twice daily and glipizide 10 mg twice daily.  She will work on lifestyle modifications.  Recheck in 3 to 6 months.

## 2019-03-20 NOTE — Assessment & Plan Note (Signed)
Check vitamin D level today 

## 2019-03-20 NOTE — Assessment & Plan Note (Signed)
Stable.  Ativan refilled.

## 2019-03-20 NOTE — Progress Notes (Signed)
   Emily [name] is a 59 y.o. female who presents today for an office visit.  Assessment/Plan:  New/Acute Problems: Dental Pain No red flags.  Will send in prescription for amoxicillin and she will follow-up with dentistry.  Chronic Problems Addressed Today: Vitamin D deficiency Check vitamin D level today.  Type 2 diabetes mellitus with neurological complications (HCC) 123456 improved to 8.0.  We will continue Metformin 1000 mg twice daily and glipizide 10 mg twice daily.  She will work on lifestyle modifications.  Recheck in 3 to 6 months.  B12 deficiency Check B12 level.  Cervical radiculopathy Stable.  Offered referral to orthopedic/sports medicine however patient declined.   Insomnia Stable.  Ativan refilled.     Subjective:  HPI:  Patient seen about a month ago.  Diagnosed with cervical radiculopathy.  Started on diclofenac and Flexeril.  Symptoms not significantly improved.  Still has some burning sensation in her left arm.  She has been doing well otherwise with her chronic conditions.  She would like to have her B12 and vitamin D level checked today.  She has had several dietary indiscretions recently due to the holidays.  She has been compliant with her medications without any reported side effects.  She has developed some dental pain over the last couple of weeks.  She will be planning on seeing her dentist soon.       Objective:  Physical Exam: BP 110/76   Pulse 100   Temp (!) 97.4 F (36.3 C)   Ht 5\' 4"  (1.626 m)   Wt 172 lb (78 kg)   SpO2 97%   BMI 29.52 kg/m   Gen: No acute distress, resting comfortably CV: Regular rate and rhythm with no murmurs appreciated Pulm: Normal work of breathing, clear to auscultation bilaterally with no crackles, wheezes, or rhonchi Neuro: Grossly normal, moves all extremities Psych: Normal affect and thought content      Danyelle Brookover M. Jerline Pain, MD 03/20/2019 1:53 PM

## 2019-03-20 NOTE — Assessment & Plan Note (Addendum)
Stable.  Offered referral to orthopedic/sports medicine however patient declined.

## 2019-03-20 NOTE — Assessment & Plan Note (Signed)
Check B12 level. 

## 2019-03-20 NOTE — Patient Instructions (Signed)
It was very nice to see you today!  Your A1c is 8.0.  This is better than what it was before.  Please continue working on diet and exercise.  I will send in some amoxicillin.  I will also refill your Ativan.  We will check your vitamin D and B12 levels today.  Come back in 3 to 6 months for your next checkup, or sooner if needed.  Take care, Dr Jerline Pain  Please try these tips to maintain a healthy lifestyle:   Eat at least 3 REAL meals and 1-2 snacks per day.  Aim for no more than 5 hours between eating.  If you eat breakfast, please do so within one hour of getting up.    Each meal should contain half fruits/vegetables, one quarter protein, and one quarter carbs (no bigger than a computer mouse)   Cut down on sweet beverages. This includes juice, soda, and sweet tea.     Drink at least 1 glass of water with each meal and aim for at least 8 glasses per day   Exercise at least 150 minutes every week.

## 2019-03-21 NOTE — Progress Notes (Signed)
Please inform patient of the following:  B12 level is normal.  Vitamin D is improving but still low. Recommend she taking 2000IU vitamin D daily and we can recheck in 6 months.  [name]. Jerline Pain, MD 03/21/2019 8:20 AM

## 2019-03-24 ENCOUNTER — Other Ambulatory Visit (HOSPITAL_COMMUNITY): Payer: Self-pay | Admitting: Internal Medicine

## 2019-03-24 DIAGNOSIS — E785 Hyperlipidemia, unspecified: Secondary | ICD-10-CM

## 2019-03-24 NOTE — Telephone Encounter (Signed)
Advanced Heart Failure Patient Advocate Encounter   Patient was approved to receive Entresto from [employer]  Patient ID: [id number] Effective dates: 03/22/19 through 02/27/20  I have called patient and informed her of approval. She will call [employer] when she is ready for a refill.  Charlann Boxer, CPhT

## 2019-04-18 ENCOUNTER — Encounter: Payer: Self-pay | Admitting: Family Medicine

## 2019-04-29 ENCOUNTER — Other Ambulatory Visit: Payer: Self-pay | Admitting: Family Medicine

## 2019-05-26 ENCOUNTER — Other Ambulatory Visit (HOSPITAL_COMMUNITY): Payer: Self-pay | Admitting: Internal Medicine

## 2019-05-26 ENCOUNTER — Other Ambulatory Visit: Payer: Self-pay | Admitting: Family Medicine

## 2019-06-16 ENCOUNTER — Other Ambulatory Visit (HOSPITAL_COMMUNITY): Payer: Self-pay | Admitting: Internal Medicine

## 2019-06-16 DIAGNOSIS — E785 Hyperlipidemia, unspecified: Secondary | ICD-10-CM

## 2019-06-19 ENCOUNTER — Other Ambulatory Visit: Payer: Self-pay | Admitting: Family Medicine

## 2019-06-19 ENCOUNTER — Ambulatory Visit: Payer: Medicare Other | Admitting: Family Medicine

## 2019-06-19 NOTE — Telephone Encounter (Signed)
Last refill: 03/20/19 #90, 0 Last OV: 03/20/19 dx. DM f/u

## 2019-06-23 ENCOUNTER — Other Ambulatory Visit: Payer: Self-pay | Admitting: Family Medicine

## 2019-06-23 NOTE — Telephone Encounter (Signed)
Rx request 

## 2019-06-23 NOTE — Telephone Encounter (Signed)
Rx sent in a few days ago - please make sure she was able to fill.  [name]. Jerline Pain, MD 06/23/2019 2:34 PM

## 2019-06-30 ENCOUNTER — Ambulatory Visit (HOSPITAL_COMMUNITY)
Admission: RE | Admit: 2019-06-30 | Discharge: 2019-06-30 | Disposition: A | Discharge status: Home / Self Care | Payer: Medicare Other | Source: Ambulatory Visit | Attending: Internal Medicine | Admitting: Internal Medicine

## 2019-06-30 ENCOUNTER — Other Ambulatory Visit: Payer: Self-pay

## 2019-06-30 ENCOUNTER — Encounter (HOSPITAL_COMMUNITY): Payer: Self-pay | Admitting: Internal Medicine

## 2019-06-30 VITALS — BP 112/64 | HR 91 | Wt 172.8 lb

## 2019-06-30 DIAGNOSIS — Z7984 Long term (current) use of oral hypoglycemic drugs: Secondary | ICD-10-CM | POA: Insufficient documentation

## 2019-06-30 DIAGNOSIS — E1149 Type 2 diabetes mellitus with other diabetic neurological complication: Secondary | ICD-10-CM

## 2019-06-30 DIAGNOSIS — I493 Ventricular premature depolarization: Secondary | ICD-10-CM | POA: Insufficient documentation

## 2019-06-30 DIAGNOSIS — I428 Other cardiomyopathies: Secondary | ICD-10-CM | POA: Insufficient documentation

## 2019-06-30 DIAGNOSIS — Z7982 Long term (current) use of aspirin: Secondary | ICD-10-CM | POA: Insufficient documentation

## 2019-06-30 DIAGNOSIS — F17200 Nicotine dependence, unspecified, uncomplicated: Secondary | ICD-10-CM | POA: Diagnosis not present

## 2019-06-30 DIAGNOSIS — Z79899 Other long term (current) drug therapy: Secondary | ICD-10-CM | POA: Diagnosis not present

## 2019-06-30 DIAGNOSIS — I110 Hypertensive heart disease with heart failure: Secondary | ICD-10-CM | POA: Insufficient documentation

## 2019-06-30 DIAGNOSIS — I2510 Atherosclerotic heart disease of native coronary artery without angina pectoris: Secondary | ICD-10-CM | POA: Insufficient documentation

## 2019-06-30 DIAGNOSIS — Z720 Tobacco use: Secondary | ICD-10-CM | POA: Diagnosis not present

## 2019-06-30 DIAGNOSIS — R570 Cardiogenic shock: Secondary | ICD-10-CM | POA: Insufficient documentation

## 2019-06-30 DIAGNOSIS — E785 Hyperlipidemia, unspecified: Secondary | ICD-10-CM | POA: Insufficient documentation

## 2019-06-30 DIAGNOSIS — E119 Type 2 diabetes mellitus without complications: Secondary | ICD-10-CM | POA: Diagnosis not present

## 2019-06-30 DIAGNOSIS — Z7901 Long term (current) use of anticoagulants: Secondary | ICD-10-CM | POA: Diagnosis not present

## 2019-06-30 DIAGNOSIS — J449 Chronic obstructive pulmonary disease, unspecified: Secondary | ICD-10-CM | POA: Insufficient documentation

## 2019-06-30 DIAGNOSIS — Z853 Personal history of malignant neoplasm of breast: Secondary | ICD-10-CM | POA: Insufficient documentation

## 2019-06-30 DIAGNOSIS — I5022 Chronic systolic (congestive) heart failure: Secondary | ICD-10-CM | POA: Insufficient documentation

## 2019-06-30 LAB — CBC
HCT: 41 % (ref 36.0–46.0)
Hemoglobin: 13 g/dL (ref 12.0–15.0)
MCH: 28.7 pg (ref 26.0–34.0)
MCHC: 31.7 g/dL (ref 30.0–36.0)
MCV: 90.5 fL (ref 80.0–100.0)
Platelets: 219 10*3/uL (ref 150–400)
RBC: 4.53 MIL/uL (ref 3.87–5.11)
RDW: 13.2 % (ref 11.5–15.5)
WBC: 9.9 10*3/uL (ref 4.0–10.5)
nRBC: 0 % (ref 0.0–0.2)

## 2019-06-30 LAB — COMPREHENSIVE METABOLIC PANEL
ALT: 18 U/L (ref 0–44)
AST: 17 U/L (ref 15–41)
Albumin: 3.6 g/dL (ref 3.5–5.0)
Alkaline Phosphatase: 55 U/L (ref 38–126)
Anion gap: 10 (ref 5–15)
BUN: 16 mg/dL (ref 6–20)
CO2: 26 mmol/L (ref 22–32)
Calcium: 9.1 mg/dL (ref 8.9–10.3)
Chloride: 103 mmol/L (ref 98–111)
Creatinine, Ser: 1.45 mg/dL — ABNORMAL HIGH (ref 0.44–1.00)
GFR calc Af Amer: 45 mL/min — ABNORMAL LOW (ref >60)
GFR calc non Af Amer: 39 mL/min — ABNORMAL LOW (ref >60)
Glucose, Bld: 276 mg/dL — ABNORMAL HIGH (ref 70–99)
Potassium: 5.2 mmol/L — ABNORMAL HIGH (ref 3.5–5.1)
Sodium: 139 mmol/L (ref 135–145)
Total Bilirubin: 0.6 mg/dL (ref 0.3–1.2)
Total Protein: 6.7 g/dL (ref 6.5–8.1)

## 2019-06-30 LAB — HEMOGLOBIN A1C
Hgb A1c MFr Bld: 8.5 % — ABNORMAL HIGH (ref 4.8–5.6)
Mean Plasma Glucose: 197.25 mg/dL

## 2019-06-30 LAB — LIPID PANEL
Cholesterol: 136 mg/dL (ref 0–200)
HDL: 46 mg/dL (ref >40)
LDL Cholesterol: 33 mg/dL (ref 0–99)
Total CHOL/HDL Ratio: 3 RATIO
Triglycerides: 284 mg/dL — ABNORMAL HIGH (ref <150)
VLDL: 57 mg/dL — ABNORMAL HIGH (ref 0–40)

## 2019-06-30 NOTE — Patient Instructions (Signed)
No medication changes today!  Dr Zollie Beckers MD- [id number] (Ortho)  Labs today We will only contact you if something comes back abnormal or we need to make some changes. Otherwise no news is good news!  Your physician has requested that you have an echocardiogram. Echocardiography is a painless test that uses sound waves to create images of your heart. It provides your doctor with information about the size and shape of your heart and how well your heart's chambers and valves are working. This procedure takes approximately one hour. There are no restrictions for this procedure.   Your physician recommends that you schedule a follow-up appointment in: 6 months with an ECHO  Please call office at [phone] option 2 if you have any questions or concerns.   At the [facility], you and your health needs are our priority. As part of our continuing mission to provide you with exceptional heart care, we have created designated Provider Care Teams. These Care Teams include your primary Cardiologist (physician) and Advanced Practice Providers (APPs- Physician Assistants and Nurse Practitioners) who all work together to provide you with the care you need, when you need it.   You may see any of the following providers on your designated Care Team at your next follow up: Marland Kitchen Dr Glori Bickers . Dr Loralie Champagne . Darrick Grinder, NP . Lyda Jester, PA . Audry Riles, PharmD   Please be sure to bring in all your medications bottles to every appointment.

## 2019-06-30 NOTE — Addendum Note (Signed)
Encounter addended by: Valeda Malm, RN on: 06/30/2019 2:25 PM  Actions taken: Visit diagnoses modified, Order list changed, Diagnosis association updated, Charge Capture section accepted, Clinical Note Signed

## 2019-06-30 NOTE — Progress Notes (Signed)
Patient ID: Emily [name], female   DOB: 22-May-1960, 59 y.o.   MRN: [id number]  ADVANCED HF CLINIC NOTE  PCP: Dr Kathlen Mody Primary Cardiologist: Dr. Haroldine Laws Oncologist: Dr Tami Lin   Emily [name] is a 59 yo woman with a history of ER + R breast cancer s/p R lumpectomy, DM2, cholelithiasis s/p cholecystectomy (02/2013), COPD and systolic CHF due to nonischemic cardiomyopathy.  Patient was admitted in 09/2012 with acute pulmonary edema and cardiogenic shock.  LHC showed moderate nonobstructive CAD (40-50% mLAD, 50-70% LCx, 50% PDA).  She was started on milrinone and diuresed.  Cause of cardiomyopathy suspected to be Adriamycin toxicity.    12/05/12 Carotid Dopplers- R 39 % stenosis and L  40-59% stenosis  Had surgery 4/17 in [place] to remove R chest leiomyosarcoma. Margins not clear so underwent repeat resection here with Dr. Barry Dienes.    Had pancreatitis in 5/19  Admitted May 2017 for recurrent HF symptome. Found to have EF 10-15% with NYHA IIIB-IV symptoms. Was admitted. UDS + cocaine. Cath showed non-obstructive CAD with Cardiogenic shock initial PA sat 34%, 42%. Started on milrinone. Diuresed about 5 pounds. Milrinone weaned off and co-ox dropped down to 52% but was feeling ok.   Today she returns for HF follow up. Overall feels ok from cardiac perspective. Gets SOB if she moves around too much. No edema, orthopnea or PND. Main complain is frozen left shoulder. Still smoking 1 ppd.   PFTs 1/19 FEV1 1.23 (45%) FVC 1.69 (48%) DLCO 61%  Echo 10/27/12 EF 20-25% Echo 01/09/13 EF 30-35% Echo 06/05/13 EF 45-50% septum dysynnergic Echo 5/17: EF 15-20% Echo 12/01/15 LVEF 55-60% Echo 02/2017 EF 40-45%. Echo 02/2018 EF 55-60%   Cath 07/14/15 Ao = 106/72 (87) LV = 108/28 (36) RA = 22 RV = 60/14/25 RA = 67/22 (51) PCW = 31 Fick cardiac output/index = 2.8/1.6 SVR = 1823 PVR = 7.0 WU FA sat = 90% PA sat = 34%, 42%  Assessment: 1. Mild non-obstructive CAD 2. Severe NICM with EF 25% by echo 3.  Cardiogenic shock physiology with markedly elevated biventricular filling pressures Lexiscan 09/23/13: EF 50% normal perfusion Holter: SR occasional PVCs  PMH: 1. COPD: PFTs (1/14) with FEV1 68%.  Quit smoking 9/14.  2. Type II diabetes  3. HTN 4. Breast cancer: 2009, s/p Adriamycin/Cytoxan/Taxol chemo, lumpectomy and radiation.   5. Nonischemic cardiomyopathy: Possibly due to Adriamycin.  Echo (8/14) with EF 20-25%, global hypokinesis, moderate diastolic dysfunction, normal RV size and with mild to moderately decreased systolic function, moderate TR.   6. CAD: LHC (8/14) with nonobstructive CAD; 40-50% mLAD, 50-70% LCx, 50% PDA.  7. Hyperlipidemia  SH:   Mother is next door.  Still smoking.   FH: No premature CAD.  No family history of cardiomyopathy.   Review of systems complete and found to be negative unless listed in HPI.    Current Outpatient Medications  Medication Sig Dispense Refill  . albuterol (PROVENTIL HFA;VENTOLIN HFA) 108 (90 BASE) MCG/ACT inhaler Inhale 2 puffs into the lungs every 6 (six) hours as needed for wheezing or shortness of breath. 1 Inhaler 0  . aspirin EC 81 MG tablet Take 1 tablet (81 mg total) by mouth daily. 90 tablet 3  . atorvastatin (LIPITOR) 20 MG tablet Take 1 tablet by mouth once daily 90 tablet 3  . carvedilol (COREG) 6.25 MG tablet Take 1 tablet (6.25 mg total) by mouth 2 (two) times daily with a meal. 180 tablet 1  . Cholecalciferol 125 MCG (5000 UT) TABS  Vitamin D3 125 mcg (5,000 unit) tablet  Take by oral route.    . citalopram (CELEXA) 40 MG tablet Take 1 tablet by mouth once daily 90 tablet 0  . Cyanocobalamin (VITAMIN B 12 PO) Take by mouth daily.    . fluorouracil (EFUDEX) 5 % cream Apply topically 2 (two) times daily.    . furosemide (LASIX) 20 MG tablet Take 1 tablet (20 mg total) by mouth daily as needed for edema. 180 tablet 0  . glipiZIDE (GLUCOTROL) 10 MG tablet Take 1 tablet (10 mg total) by mouth 2 (two) times daily before a meal.  180 tablet 3  . glucose blood test strip Check blood sugar three times daily 100 each 11  . hydrOXYzine (ATARAX/VISTARIL) 50 MG tablet TAKE 1 TO 2 TABLETS BY MOUTH THREE TIMES DAILY AS NEEDED FOR ITCHING 90 tablet 0  . LORazepam (ATIVAN) 2 MG tablet TAKE 1 TABLET BY MOUTH AT BEDTIME AS NEEDED FOR ANXIETY 90 tablet 0  . metFORMIN (GLUCOPHAGE) 1000 MG tablet Take 1 tablet (1,000 mg total) by mouth 2 (two) times daily with a meal. 180 tablet 3  . ondansetron (ZOFRAN ODT) 4 MG disintegrating tablet Take 1 tablet (4 mg total) by mouth every 8 (eight) hours as needed for nausea or vomiting. 10 tablet 0  . sacubitril-valsartan (ENTRESTO) 97-103 MG Take 1 tablet by mouth 2 (two) times daily. 180 tablet 3  . spironolactone (ALDACTONE) 25 MG tablet Take 1/2 (one-half) tablet by mouth once daily 45 tablet 3   No current facility-administered medications for this encounter.    Vitals:   06/30/19 1342  BP: 112/64  Pulse: 91  SpO2: 94%  Weight: 78.4 kg (172 lb 12.8 oz)   Wt Readings from Last 3 Encounters:  06/30/19 78.4 kg (172 lb 12.8 oz)  03/20/19 78 kg (172 lb)  02/18/19 79.9 kg (176 lb 4 oz)    General:  Well appearing. No resp difficulty HEENT: normal Neck: supple. no JVD. Carotids 2+ bilat; no bruits. No lymphadenopathy or thryomegaly appreciated. Cor: PMI nondisplaced. Regular rate & rhythm. No rubs, gallops or murmurs. Lungs: clear with decreased BS throughout Abdomen: soft, nontender, nondistended. No hepatosplenomegaly. No bruits or masses. Good bowel sounds. Extremities: no cyanosis, clubbing, rash, edema Neuro: alert & orientedx3, cranial nerves grossly intact. moves all 4 extremities w/o difficulty except for frozen left shoulder. Affect pleasant  ECG: NSR 92  PRWP Personally reviewed   Assessment/Plan:  1. Chronic systolic CHF: NICM, likely Adriamycin toxicity, - Echo 02/2017 EF 40-45%. - Echo 02/2018 EF 55-60% - NYHA II-III but likely more due to COPD than HF - Volume status  ok - Continue Entresto 97/103.  -Continue spiro 12.5 mg daily. Unable to tolerate 25 daily due to hyperK - Continue carvedilol to 6.25 BID  - Repeat echo at next visit - Labs today  2. CAD: Moderate nonobstructive disease. - No s/s ischemia. Continue ASA and statin 3. COPD/tobacco use -Smoking. Again discussed need for smoking cessation. 4. Cocaine use - Remains abstinent 5. Breast Cancer: - Previously received adriamycin. Cancer free for > 5 years. No change. 6. Carotid stenosis:  - Asymptomatic.  - Mild 1-39% 2/20  - On statin.  7. R chest leiomyosarcoma: - Had further resections and margins now clear.  - Sees Dr. Alen Blew and Dr. Barry Dienes. - No change 8. DM2 - Blood sugar remains high. Add Jardiance 10  9. Tobacco Abuse - Discussed need for smoking cessation   Follow up in 6 months,  Glori Bickers, MD  2:10 PM

## 2019-07-26 ENCOUNTER — Other Ambulatory Visit: Payer: Self-pay | Admitting: Family Medicine

## 2019-08-05 LAB — COLOGUARD: Cologuard: NEGATIVE

## 2019-08-13 ENCOUNTER — Encounter: Payer: Self-pay | Admitting: Family Medicine

## 2019-08-13 ENCOUNTER — Telehealth: Payer: Self-pay | Admitting: *Deleted

## 2019-08-13 NOTE — Telephone Encounter (Signed)
Unable to contact patient  No voice mail   Cologuard results negative

## 2019-08-26 ENCOUNTER — Other Ambulatory Visit: Payer: Self-pay | Admitting: Family Medicine

## 2019-09-12 ENCOUNTER — Other Ambulatory Visit: Payer: Self-pay | Admitting: Family Medicine

## 2019-09-15 MED ORDER — LORAZEPAM 2 MG PO TABS: 2.0000 mg | ORAL_TABLET | Freq: Every evening | ORAL | 0 refills | Status: DC | PRN

## 2019-09-15 NOTE — Telephone Encounter (Signed)
Rx request 

## 2019-10-09 DIAGNOSIS — M7502 Adhesive capsulitis of left shoulder: Secondary | ICD-10-CM | POA: Diagnosis not present

## 2019-10-09 DIAGNOSIS — M25512 Pain in left shoulder: Secondary | ICD-10-CM | POA: Diagnosis not present

## 2019-10-28 ENCOUNTER — Other Ambulatory Visit (HOSPITAL_COMMUNITY): Payer: Self-pay | Admitting: Internal Medicine

## 2019-11-07 ENCOUNTER — Other Ambulatory Visit: Payer: Self-pay | Admitting: Family Medicine

## 2019-11-07 DIAGNOSIS — Z1231 Encounter for screening mammogram for malignant neoplasm of breast: Secondary | ICD-10-CM

## 2019-11-10 ENCOUNTER — Encounter: Payer: Self-pay | Admitting: Family Medicine

## 2019-11-11 ENCOUNTER — Ambulatory Visit: Payer: Medicare Other | Admitting: Family Medicine

## 2019-11-12 ENCOUNTER — Other Ambulatory Visit: Payer: Self-pay | Admitting: Family Medicine

## 2019-11-12 DIAGNOSIS — M19071 Primary osteoarthritis, right ankle and foot: Secondary | ICD-10-CM | POA: Diagnosis not present

## 2019-11-12 DIAGNOSIS — Z9889 Other specified postprocedural states: Secondary | ICD-10-CM | POA: Diagnosis not present

## 2019-11-12 DIAGNOSIS — S99921A Unspecified injury of right foot, initial encounter: Secondary | ICD-10-CM | POA: Diagnosis not present

## 2019-11-12 DIAGNOSIS — Z981 Arthrodesis status: Secondary | ICD-10-CM | POA: Diagnosis not present

## 2019-11-12 DIAGNOSIS — M79671 Pain in right foot: Secondary | ICD-10-CM | POA: Diagnosis not present

## 2019-11-24 ENCOUNTER — Other Ambulatory Visit (HOSPITAL_COMMUNITY): Payer: Self-pay | Admitting: Internal Medicine

## 2019-11-28 ENCOUNTER — Ambulatory Visit (INDEPENDENT_AMBULATORY_CARE_PROVIDER_SITE_OTHER): Payer: Medicare Other | Admitting: Family Medicine

## 2019-11-28 ENCOUNTER — Other Ambulatory Visit: Payer: Self-pay | Admitting: Family Medicine

## 2019-11-28 ENCOUNTER — Encounter: Payer: Self-pay | Admitting: Family Medicine

## 2019-11-28 ENCOUNTER — Other Ambulatory Visit: Payer: Self-pay

## 2019-11-28 VITALS — BP 91/60 | HR 100 | Temp 97.3°F | Ht 64.0 in | Wt 176.4 lb

## 2019-11-28 DIAGNOSIS — E1149 Type 2 diabetes mellitus with other diabetic neurological complication: Secondary | ICD-10-CM | POA: Diagnosis not present

## 2019-11-28 DIAGNOSIS — Z853 Personal history of malignant neoplasm of breast: Secondary | ICD-10-CM

## 2019-11-28 DIAGNOSIS — N644 Mastodynia: Secondary | ICD-10-CM

## 2019-11-28 DIAGNOSIS — E1169 Type 2 diabetes mellitus with other specified complication: Secondary | ICD-10-CM | POA: Diagnosis not present

## 2019-11-28 DIAGNOSIS — G4700 Insomnia, unspecified: Secondary | ICD-10-CM

## 2019-11-28 DIAGNOSIS — Z23 Encounter for immunization: Secondary | ICD-10-CM

## 2019-11-28 DIAGNOSIS — R062 Wheezing: Secondary | ICD-10-CM | POA: Diagnosis not present

## 2019-11-28 DIAGNOSIS — M5136 Other intervertebral disc degeneration, lumbar region: Secondary | ICD-10-CM | POA: Insufficient documentation

## 2019-11-28 MED ORDER — HYDROXYZINE HCL 50 MG PO TABS
ORAL_TABLET | ORAL | 0 refills | Status: DC
Start: 2019-11-28 — End: 2020-02-18

## 2019-11-28 MED ORDER — ALBUTEROL SULFATE HFA 108 (90 BASE) MCG/ACT IN AERS: 2.0000 | INHALATION_SPRAY | Freq: Four times a day (QID) | RESPIRATORY_TRACT | 0 refills | Status: DC | PRN

## 2019-11-28 MED ORDER — METFORMIN HCL 1000 MG PO TABS: 1000.0000 mg | ORAL_TABLET | Freq: Two times a day (BID) | ORAL | 3 refills | Status: DC

## 2019-11-28 MED ORDER — GLIPIZIDE 10 MG PO TABS
ORAL_TABLET | ORAL | 0 refills | Status: DC
Start: 2019-11-28 — End: 2020-01-05

## 2019-11-28 NOTE — Assessment & Plan Note (Signed)
Follows with oncology.  Will place order for diagnostic mammogram.

## 2019-11-28 NOTE — Assessment & Plan Note (Signed)
Refill hydroxyzine.  She will also try over-the-counter melatonin.  May consider trazodone if no improvement with this.

## 2019-11-28 NOTE — Progress Notes (Signed)
   Emily [name] is a 59 y.o. female who presents today for an office visit.  Assessment/Plan:  New/Acute Problems: Breast Pain Symptoms have resolved.  Likely secondary to contusion from recent fall.  Will place referral for diagnostic mammogram in light of her history of breast cancer.  Chronic Problems Addressed Today: History of breast cancer Follows with oncology.  Will place order for diagnostic mammogram.  Insomnia Refill hydroxyzine.  She will also try over-the-counter melatonin.  May consider trazodone if no improvement with this.  Type 2 diabetes mellitus with neurological complications ([place]) Due for A1c.  Deferred for today.  Refill glipizide 10 mg daily and Metformin 1000 mg twice daily with meals.  She will come back soon to recheck A1c.  Would likely benefit from switching to Jardiance from glipizide.     Subjective:  HPI:  Patient here with left breast pain.  She suffered a fall 4 weeks ago at home.  Fall was mechanical in nature after tripping over a rug in her house.  She ended up going to urgent care due to right ankle pain and left shoulder pain.  She was diagnosed with frozen shoulder and sprained right ankle.  She ended up following with orthopedist and had injection performed on left shoulder recently.  Symptoms have been improving.  She has also had left breast pain.  She is due for her mammogram and when she called to schedule the appointment she was told that she may need referral for diagnostic mammogram due to left breast pain.  Symptoms seem to have improved.  No longer having pain.  See A/P for status of chronic conditions.       Objective:  Physical Exam: BP 91/60   Pulse 100   Temp (!) 97.3 F (36.3 C) (Temporal)   Ht 5\' 4"  (1.626 m)   Wt 176 lb 6.4 oz (80 kg)   SpO2 96%   BMI 30.28 kg/m   Gen: No acute distress, resting comfortably Breast: Deferred Neuro: Grossly normal, moves all extremities Psych: Normal affect and thought content       Byran Bilotti M. Jerline Pain, MD 11/28/2019 11:44 AM

## 2019-11-28 NOTE — Assessment & Plan Note (Signed)
Due for A1c.  Deferred for today.  Refill glipizide 10 mg daily and Metformin 1000 mg twice daily with meals.  She will come back soon to recheck A1c.  Would likely benefit from switching to Jardiance from glipizide.

## 2019-11-28 NOTE — Patient Instructions (Signed)
It was very nice to see you today!  I will refill your medications and place a referral for your mammogram.  I think switching to Jardiance would be a good idea.  We can discuss this at your next office visit.  Please come back to see me in 3 months or so for a diabetes checkup.  Please come back to see me sooner if needed.  Take care, Dr Jerline Pain  Please try these tips to maintain a healthy lifestyle:   Eat at least 3 REAL meals and 1-2 snacks per day.  Aim for no more than 5 hours between eating.  If you eat breakfast, please do so within one hour of getting up.    Each meal should contain half fruits/vegetables, one quarter protein, and one quarter carbs (no bigger than a computer mouse)   Cut down on sweet beverages. This includes juice, soda, and sweet tea.     Drink at least 1 glass of water with each meal and aim for at least 8 glasses per day   Exercise at least 150 minutes every week.

## 2019-12-16 ENCOUNTER — Other Ambulatory Visit: Payer: Self-pay | Admitting: Family Medicine

## 2019-12-17 MED ORDER — LORAZEPAM 2 MG PO TABS: 2.0000 mg | ORAL_TABLET | Freq: Every evening | ORAL | 0 refills | Status: DC | PRN

## 2019-12-17 NOTE — Telephone Encounter (Signed)
LAST APPOINTMENT DATE: 11/28/2019   NEXT APPOINTMENT DATE: Visit date not found   Rx Ativan  LAST REFILL: 09/15/2019  QTY: 90 Ref 0

## 2019-12-19 ENCOUNTER — Ambulatory Visit: Payer: Medicare Other

## 2019-12-19 ENCOUNTER — Ambulatory Visit
Admission: RE | Admit: 2019-12-19 | Discharge: 2019-12-19 | Disposition: A | Discharge status: Home / Self Care | Payer: Medicare Other | Source: Ambulatory Visit | Attending: Family Medicine | Admitting: Family Medicine

## 2019-12-19 ENCOUNTER — Other Ambulatory Visit: Payer: Self-pay

## 2019-12-19 DIAGNOSIS — N644 Mastodynia: Secondary | ICD-10-CM | POA: Diagnosis not present

## 2019-12-19 DIAGNOSIS — Z853 Personal history of malignant neoplasm of breast: Secondary | ICD-10-CM

## 2019-12-22 ENCOUNTER — Ambulatory Visit (HOSPITAL_BASED_OUTPATIENT_CLINIC_OR_DEPARTMENT_OTHER)
Admission: RE | Admit: 2019-12-22 | Discharge: 2019-12-22 | Disposition: A | Discharge status: Home / Self Care | Payer: Medicare Other | Source: Ambulatory Visit | Attending: Internal Medicine | Admitting: Internal Medicine

## 2019-12-22 ENCOUNTER — Ambulatory Visit (HOSPITAL_COMMUNITY)
Admission: RE | Admit: 2019-12-22 | Discharge: 2019-12-22 | Disposition: A | Discharge status: Home / Self Care | Payer: Medicare Other | Source: Ambulatory Visit | Attending: Internal Medicine | Admitting: Internal Medicine

## 2019-12-22 ENCOUNTER — Other Ambulatory Visit: Payer: Self-pay

## 2019-12-22 ENCOUNTER — Encounter (HOSPITAL_COMMUNITY): Payer: Self-pay | Admitting: Internal Medicine

## 2019-12-22 VITALS — BP 120/70 | HR 89 | Wt 177.4 lb

## 2019-12-22 DIAGNOSIS — E119 Type 2 diabetes mellitus without complications: Secondary | ICD-10-CM | POA: Diagnosis not present

## 2019-12-22 DIAGNOSIS — R0600 Dyspnea, unspecified: Secondary | ICD-10-CM | POA: Diagnosis not present

## 2019-12-22 DIAGNOSIS — I5022 Chronic systolic (congestive) heart failure: Secondary | ICD-10-CM

## 2019-12-22 DIAGNOSIS — F17200 Nicotine dependence, unspecified, uncomplicated: Secondary | ICD-10-CM | POA: Diagnosis not present

## 2019-12-22 DIAGNOSIS — E1149 Type 2 diabetes mellitus with other diabetic neurological complication: Secondary | ICD-10-CM | POA: Diagnosis not present

## 2019-12-22 DIAGNOSIS — Z720 Tobacco use: Secondary | ICD-10-CM

## 2019-12-22 DIAGNOSIS — I110 Hypertensive heart disease with heart failure: Secondary | ICD-10-CM | POA: Diagnosis not present

## 2019-12-22 DIAGNOSIS — K219 Gastro-esophageal reflux disease without esophagitis: Secondary | ICD-10-CM | POA: Diagnosis not present

## 2019-12-22 DIAGNOSIS — E785 Hyperlipidemia, unspecified: Secondary | ICD-10-CM | POA: Diagnosis not present

## 2019-12-22 DIAGNOSIS — Z859 Personal history of malignant neoplasm, unspecified: Secondary | ICD-10-CM | POA: Insufficient documentation

## 2019-12-22 DIAGNOSIS — I2510 Atherosclerotic heart disease of native coronary artery without angina pectoris: Secondary | ICD-10-CM

## 2019-12-22 DIAGNOSIS — I5032 Chronic diastolic (congestive) heart failure: Secondary | ICD-10-CM

## 2019-12-22 LAB — BASIC METABOLIC PANEL
Anion gap: 7 (ref 5–15)
BUN: 16 mg/dL (ref 8–23)
CO2: 24 mmol/L (ref 22–32)
Calcium: 8.8 mg/dL — ABNORMAL LOW (ref 8.9–10.3)
Chloride: 105 mmol/L (ref 98–111)
Creatinine, Ser: 1.33 mg/dL — ABNORMAL HIGH (ref 0.44–1.00)
GFR, Estimated: 46 mL/min — ABNORMAL LOW (ref >60)
Glucose, Bld: 115 mg/dL — ABNORMAL HIGH (ref 70–99)
Potassium: 5.7 mmol/L — ABNORMAL HIGH (ref 3.5–5.1)
Sodium: 136 mmol/L (ref 135–145)

## 2019-12-22 LAB — CBC
HCT: 40.2 % (ref 36.0–46.0)
Hemoglobin: 12.7 g/dL (ref 12.0–15.0)
MCH: 28.2 pg (ref 26.0–34.0)
MCHC: 31.6 g/dL (ref 30.0–36.0)
MCV: 89.1 fL (ref 80.0–100.0)
Platelets: 201 10*3/uL (ref 150–400)
RBC: 4.51 MIL/uL (ref 3.87–5.11)
RDW: 13.3 % (ref 11.5–15.5)
WBC: 10.4 10*3/uL (ref 4.0–10.5)
nRBC: 0 % (ref 0.0–0.2)

## 2019-12-22 LAB — ECHOCARDIOGRAM COMPLETE
Area-P 1/2: 2.95 cm2
S' Lateral: 3.3 cm

## 2019-12-22 LAB — BRAIN NATRIURETIC PEPTIDE: B Natriuretic Peptide: 33.1 pg/mL (ref 0.0–100.0)

## 2019-12-22 MED ORDER — SPIRONOLACTONE 25 MG PO TABS: ORAL_TABLET | ORAL | 3 refills | Status: DC

## 2019-12-22 NOTE — Progress Notes (Signed)
  Echocardiogram 2D Echocardiogram has been performed.  Chanson Teems G Jezabel Lecker 12/22/2019, 2:00 PM

## 2019-12-22 NOTE — Patient Instructions (Signed)
Labs done today, your results will be available in MyChart, we will contact you for abnormal readings.  Please call our office in March 2022 to schedule your follow up appointment  If you have any questions or concerns before your next appointment please send [place] a message through [place] or call our office at [phone].    TO LEAVE A MESSAGE FOR THE NURSE SELECT OPTION 2, PLEASE LEAVE A MESSAGE INCLUDING: . YOUR NAME . DATE OF BIRTH . CALL BACK NUMBER . REASON FOR CALL**this is important as we prioritize the call backs  [place] AS LONG AS YOU CALL BEFORE 4:00 PM  At the [facility], you and your health needs are our priority. As part of our continuing mission to provide you with exceptional heart care, we have created designated Provider Care Teams. These Care Teams include your primary Cardiologist (physician) and Advanced Practice Providers (APPs- Physician Assistants and Nurse Practitioners) who all work together to provide you with the care you need, when you need it.   You may see any of the following providers on your designated Care Team at your next follow up: Marland Kitchen Dr Glori Bickers . Dr Loralie Champagne . Darrick Grinder, NP . Lyda Jester, PA . Audry Riles, PharmD   Please be sure to bring in all your medications bottles to every appointment.

## 2019-12-22 NOTE — Addendum Note (Signed)
Encounter addended by: Scarlette Calico, RN on: 12/22/2019 2:50 PM  Actions taken: Visit diagnoses modified, Order list changed, Diagnosis association updated, Clinical Note Signed, Charge Capture section accepted

## 2019-12-22 NOTE — Progress Notes (Addendum)
Patient ID: Emily [name], female   DOB: 1960-05-04, 59 y.o.   MRN: [id number]  ADVANCED HF CLINIC NOTE  PCP: Betha Loa, MD Primary Cardiologist: Dr. Haroldine Laws Oncologist: Dr Tami Lin   Emily [name] is a 59 yo woman with a history of ER + R breast cancer s/p R lumpectomy, DM2, cholelithiasis s/p cholecystectomy (02/2013), COPD and systolic CHF due to nonischemic cardiomyopathy.  Patient was admitted in 09/2012 with acute pulmonary edema and cardiogenic shock.  LHC showed moderate nonobstructive CAD (40-50% mLAD, 50-70% LCx, 50% PDA).  She was started on milrinone and diuresed.  Cause of cardiomyopathy suspected to be Adriamycin toxicity.    12/05/12 Carotid Dopplers- R 39 % stenosis and L  40-59% stenosis  Had surgery 4/17 in [place] to remove R chest leiomyosarcoma. Margins not clear so underwent repeat resection here with Dr. Barry Dienes.    Had pancreatitis in 5/19  Admitted May 2017 for recurrent HF symptome. Found to have EF 10-15% with NYHA IIIB-IV symptoms. Was admitted. UDS + cocaine. Cath showed non-obstructive CAD with Cardiogenic shock initial PA sat 34%, 42%. Started on milrinone. Diuresed about 5 pounds. Milrinone weaned off and co-ox dropped down to 52% but was feeling ok.   Today she returns for HF follow up. Doing pretty well. Denies SOB, edema, orthopnea or PND. Struggling with RSD. Compliant with all meds. Still smoking 1ppd.   Echo today (12/22/19): EF 50-55% Personally reviewed  PFTs 1/19 FEV1 1.23 (45%) FVC 1.69 (48%) DLCO 61%  Echo 10/27/12 EF 20-25% Echo 01/09/13 EF 30-35% Echo 06/05/13 EF 45-50% septum dysynnergic Echo 5/17: EF 15-20% Echo 12/01/15 LVEF 55-60% Echo 02/2017 EF 40-45%. Echo 02/2018 EF 55-60%   Cath 07/14/15 Ao = 106/72 (87) LV = 108/28 (36) RA = 22 RV = 60/14/25 RA = 67/22 (51) PCW = 31 Fick cardiac output/index = 2.8/1.6 SVR = 1823 PVR = 7.0 WU FA sat = 90% PA sat = 34%, 42%  Assessment: 1. Mild non-obstructive CAD 2. Severe NICM with EF  25% by echo 3. Cardiogenic shock physiology with markedly elevated biventricular filling pressures Lexiscan 09/23/13: EF 50% normal perfusion Holter: SR occasional PVCs  PMH: 1. COPD: PFTs (1/14) with FEV1 68%.  Quit smoking 9/14.  2. Type II diabetes  3. HTN 4. Breast cancer: 2009, s/p Adriamycin/Cytoxan/Taxol chemo, lumpectomy and radiation.   5. Nonischemic cardiomyopathy: Possibly due to Adriamycin.  Echo (8/14) with EF 20-25%, global hypokinesis, moderate diastolic dysfunction, normal RV size and with mild to moderately decreased systolic function, moderate TR.   6. CAD: LHC (8/14) with nonobstructive CAD; 40-50% mLAD, 50-70% LCx, 50% PDA.  7. Hyperlipidemia  SH:   Mother is next door.  Still smoking.   FH: No premature CAD.  No family history of cardiomyopathy.   Review of systems complete and found to be negative unless listed in HPI.    Current Outpatient Medications  Medication Sig Dispense Refill  . albuterol (VENTOLIN HFA) 108 (90 Base) MCG/ACT inhaler Inhale 2 puffs into the lungs every 6 (six) hours as needed for wheezing or shortness of breath. 1 each 0  . aspirin EC 81 MG tablet Take 1 tablet (81 mg total) by mouth daily. 90 tablet 3  . atorvastatin (LIPITOR) 20 MG tablet Take 1 tablet by mouth once daily 90 tablet 3  . carvedilol (COREG) 6.25 MG tablet TAKE 1 TABLET BY MOUTH TWICE DAILY WITH A MEAL 180 tablet 0  . Cholecalciferol 125 MCG (5000 UT) TABS Vitamin D3 125 mcg (5,000  unit) tablet  Take by oral route.    . citalopram (CELEXA) 40 MG tablet Take 1 tablet by mouth once daily 90 tablet 0  . Cyanocobalamin (VITAMIN B 12 PO) Take by mouth daily.    . fluorouracil (EFUDEX) 5 % cream Apply topically 2 (two) times daily.    . furosemide (LASIX) 20 MG tablet Take 1 tablet (20 mg total) by mouth daily as needed for edema. 180 tablet 0  . glipiZIDE (GLUCOTROL) 10 MG tablet TAKE 1 TABLET BY MOUTH TWICE DAILY WITH A MEAL 180 tablet 0  . glucose blood test strip Check blood  sugar three times daily 100 each 11  . hydrOXYzine (ATARAX/VISTARIL) 50 MG tablet TAKE 1 TO 2 TABLETS BY MOUTH THREE TIMES DAILY AS NEEDED FOR ITCHING 90 tablet 0  . LORazepam (ATIVAN) 2 MG tablet Take 1 tablet (2 mg total) by mouth at bedtime as needed for anxiety. 90 tablet 0  . metFORMIN (GLUCOPHAGE) 1000 MG tablet Take 1 tablet (1,000 mg total) by mouth 2 (two) times daily with a meal. 180 tablet 3  . ondansetron (ZOFRAN ODT) 4 MG disintegrating tablet Take 1 tablet (4 mg total) by mouth every 8 (eight) hours as needed for nausea or vomiting. 10 tablet 0  . sacubitril-valsartan (ENTRESTO) 97-103 MG Take 1 tablet by mouth 2 (two) times daily. 180 tablet 3  . spironolactone (ALDACTONE) 25 MG tablet Take 1/2 (one-half) tablet by mouth once daily 45 tablet 0   No current facility-administered medications for this encounter.    Vitals:   12/22/19 1403  BP: 120/70  Pulse: 89  SpO2: 95%  Weight: 80.5 kg (177 lb 6.4 oz)   Wt Readings from Last 3 Encounters:  12/22/19 80.5 kg (177 lb 6.4 oz)  11/28/19 80 kg (176 lb 6.4 oz)  06/30/19 78.4 kg (172 lb 12.8 oz)    General:  Well appearing. No resp difficulty HEENT: normal Neck: supple. no JVD. Carotids 2+ bilat; no bruits. No lymphadenopathy or thryomegaly appreciated. Cor: PMI nondisplaced. Regular rate & rhythm. No rubs, gallops or murmurs. Lungs: clear with decreased BS Abdomen: soft, nontender, nondistended. No hepatosplenomegaly. No bruits or masses. Good bowel sounds. Extremities: no cyanosis, clubbing, rash, edema Neuro: alert & orientedx3, cranial nerves grossly intact. moves all 4 extremities w/o difficulty. Affect pleasant  Assessment/Plan:  1. Chronic systolic CHF: NICM, likely Adriamycin toxicity, - Echo 02/2017 EF 40-45%. - Echo 02/2018 EF 55-60% - Echo today 12/22/19 EF 50-55% Personally reviewed - NYHA II but likely more due to COPD than HF - Volume status ok - Continue Entresto 97/103.  -Continue spiro 12.5 mg daily.  Unable to tolerate 25 daily due to hyperK - Continue carvedilol to 6.25 BID  - wants to discuss Jardiance with PCP.   2. CAD: Moderate nonobstructive disease. - No s/s ischemia. Continue ASA and statin 3. COPD/tobacco use -Smoking 1ppd. Encouraged abstinence.  4. Cocaine use - Remains abstinent 5. Breast Cancer: - Previously received adriamycin. Cancer free for > 5 years. No change. 6. Carotid stenosis:  - Asymptomatic  - Mild 1-39% 2/20  - On statin.  7. R chest leiomyosarcoma: - Had further resections and margins now clear.  - Sees Dr. Alen Blew and Dr. Barry Dienes. - No change 8. DM2 - Will discuss Jardiance with Dr. Jerline Pain    EF stable on ECHO.  Symptoms OK. F/u in 6 months. Labs today  Glori Bickers, MD  2:37 PM

## 2019-12-23 LAB — HEMOGLOBIN A1C
Hgb A1c MFr Bld: 8.5 % — ABNORMAL HIGH (ref 4.8–5.6)
Mean Plasma Glucose: 197 mg/dL

## 2019-12-26 ENCOUNTER — Telehealth (HOSPITAL_COMMUNITY): Payer: Self-pay | Admitting: *Deleted

## 2019-12-26 DIAGNOSIS — I5022 Chronic systolic (congestive) heart failure: Secondary | ICD-10-CM

## 2019-12-26 NOTE — Telephone Encounter (Signed)
-----   Message from Jolaine Artist, MD sent at 12/25/2019  8:16 PM EDT ----- Stop spiro. Repeat bmet 1 week

## 2019-12-26 NOTE — Telephone Encounter (Signed)
Pt aware, agreeable, and verbalized understanding, med list updated, repeat lab sch 11/8

## 2020-01-05 ENCOUNTER — Encounter: Payer: Self-pay | Admitting: Family Medicine

## 2020-01-05 ENCOUNTER — Other Ambulatory Visit (HOSPITAL_COMMUNITY): Payer: Medicare Other

## 2020-01-05 ENCOUNTER — Other Ambulatory Visit: Payer: Self-pay

## 2020-01-05 ENCOUNTER — Ambulatory Visit (INDEPENDENT_AMBULATORY_CARE_PROVIDER_SITE_OTHER): Payer: Medicare Other | Admitting: Family Medicine

## 2020-01-05 VITALS — BP 98/64 | HR 98 | Temp 97.4°F | Ht 64.0 in | Wt 176.6 lb

## 2020-01-05 DIAGNOSIS — E1149 Type 2 diabetes mellitus with other diabetic neurological complication: Secondary | ICD-10-CM | POA: Diagnosis not present

## 2020-01-05 DIAGNOSIS — I152 Hypertension secondary to endocrine disorders: Secondary | ICD-10-CM

## 2020-01-05 DIAGNOSIS — E1159 Type 2 diabetes mellitus with other circulatory complications: Secondary | ICD-10-CM | POA: Diagnosis not present

## 2020-01-05 LAB — BASIC METABOLIC PANEL
BUN/Creatinine Ratio: 15 (calc) (ref 6–22)
BUN: 19 mg/dL (ref 7–25)
CO2: 28 mmol/L (ref 20–32)
Calcium: 9.6 mg/dL (ref 8.6–10.4)
Chloride: 100 mmol/L (ref 98–110)
Creat: 1.28 mg/dL — ABNORMAL HIGH (ref 0.50–0.99)
Glucose, Bld: 284 mg/dL — ABNORMAL HIGH (ref 65–99)
Potassium: 5 mmol/L (ref 3.5–5.3)
Sodium: 138 mmol/L (ref 135–146)

## 2020-01-05 MED ORDER — EMPAGLIFLOZIN 10 MG PO TABS: 10.0000 mg | ORAL_TABLET | Freq: Every day | ORAL | 5 refills | Status: DC

## 2020-01-05 NOTE — Progress Notes (Signed)
   Emily [name] is a 59 y.o. female who presents today for an office visit.  Assessment/Plan:  Chronic Problems Addressed Today: Type 2 diabetes mellitus with neurological complications (HCC) Last A1c 8.5.  Will stop glipizide as it is not likely having much benefit at this point and start Jardiance 10 mg daily.  She will be checking with medication assistance program at her local health department.  Continue Metformin 1000 mg twice daily.  Needs to recheck A1c in 3 months.  Depending on follow up A1c may increase to 25 mg daily for possibly start GLP-1 agonist.  Hypertension associated with diabetes ([place]) At goal.  Continue Coreg 6.25 mg twice daily and Entresto 97-1 03 twice daily.  Will check BMET today for recent hyperkalemia.    Subjective:  HPI:  See A/p for status of chronic conditions.  She would like to have blood work done today.  Some have high potassium on blood work a week ago.  She stopped spironolactone per direction of cardiology.       Objective:  Physical Exam: BP 98/64   Pulse 98   Temp (!) 97.4 F (36.3 C) (Temporal)   Ht 5\' 4"  (1.626 m)   Wt 176 lb 9.6 oz (80.1 kg)   SpO2 96%   BMI 30.31 kg/m   Gen: No acute distress, resting comfortably Neuro: Grossly normal, moves all extremities Psych: Normal affect and thought content      Yuleidy Rappleye M. Jerline Pain, MD 01/05/2020 11:09 AM

## 2020-01-05 NOTE — Assessment & Plan Note (Signed)
Last A1c 8.5.  Will stop glipizide as it is not likely having much benefit at this point and start Jardiance 10 mg daily.  She will be checking with medication assistance program at her local health department.  Continue Metformin 1000 mg twice daily.  Needs to recheck A1c in 3 months.  Depending on follow up A1c may increase to 25 mg daily for possibly start GLP-1 agonist.

## 2020-01-05 NOTE — Assessment & Plan Note (Signed)
At goal.  Continue Coreg 6.25 mg twice daily and Entresto 97-1 03 twice daily.  Will check BMET today for recent hyperkalemia.

## 2020-01-05 NOTE — Patient Instructions (Signed)
It was very nice to see you today!  I think starting Jardiance would be a good idea.  Please stop the glipizide.  We will check blood work today.  Take care, Dr Jerline Pain  Please try these tips to maintain a healthy lifestyle:   Eat at least 3 REAL meals and 1-2 snacks per day.  Aim for no more than 5 hours between eating.  If you eat breakfast, please do so within one hour of getting up.    Each meal should contain half fruits/vegetables, one quarter protein, and one quarter carbs (no bigger than a computer mouse)   Cut down on sweet beverages. This includes juice, soda, and sweet tea.     Drink at least 1 glass of water with each meal and aim for at least 8 glasses per day   Exercise at least 150 minutes every week.

## 2020-01-06 NOTE — Progress Notes (Signed)
Please inform patient of the following:  Potassium level back to normal and kidney function has improved.  Do not need to do any futher testing at this point. We need to recheck her A1c in 3 months.   [name]. Jerline Pain, MD 01/06/2020 9:40 AM

## 2020-01-11 ENCOUNTER — Other Ambulatory Visit: Payer: Self-pay | Admitting: Family Medicine

## 2020-01-13 ENCOUNTER — Encounter: Payer: Self-pay | Admitting: Family Medicine

## 2020-01-13 ENCOUNTER — Telehealth: Payer: Self-pay | Admitting: *Deleted

## 2020-01-13 NOTE — Telephone Encounter (Signed)
[place] prescription assistance form sign and mail on 01/13/2020

## 2020-01-20 ENCOUNTER — Telehealth: Payer: Self-pay

## 2020-01-20 NOTE — Telephone Encounter (Signed)
Please schedule pt for f/u visit regarding this.

## 2020-01-20 NOTE — Telephone Encounter (Signed)
Nurse Assessment Nurse: Raphael Gibney, RN, Vanita Ingles Date/Time Eilene Ghazi Time): 01/20/2020 10:04:11 AM Confirm and document reason for call. If symptomatic, describe symptoms. ---Caller states she fell Nov 8. has pain in her rib area when she coughs and pain in her upper back. pain level 8. taking advil. Does the patient have any new or worsening symptoms? ---Yes Will a triage be completed? ---Yes Related visit to physician within the last 2 weeks? ---No Does the PT have any chronic conditions? (i.e. diabetes, asthma, this includes High risk factors for pregnancy, etc.) ---Yes List chronic conditions. ---reflex dystrophy Is this a behavioral health or substance abuse call? ---No Guidelines Guideline Title Affirmed Question Affirmed Notes Nurse Date/Time (Eastern Time) Back Injury [1] High-risk adult (e.g., age > 14 years, osteoporosis, chronic steroid use) AND [2] still hurts Raphael Gibney, Therapist, sports, [place] 01/20/2020 10:08:34 AM Disp. Time Eilene Ghazi Time) Disposition Final User 01/20/2020 10:12:54 AM See PCP within 24 Hours Yes Raphael Gibney, RN, Doreatha Lew Disagree/Comply Comply Caller Understands Yes PLEASE NOTE: All timestamps contained within this report are represented as [place] Standard Time. CONFIDENTIALTY NOTICE: This fax transmission is intended only for the addressee. It contains information that is legally privileged, confidential or otherwise protected from use or disclosure. If you are not the intended recipient, you are strictly prohibited from reviewing, disclosing, copying using or disseminating any of this information or taking any action in reliance on or regarding this information. If you have received this fax in error, please notify us immediately by telephone so that we can arrange for its return to [place]. Phone: [phone], Toll-Free: [phone], Fax: [phone] Page: 2 of 2 Call Id: [id number] PreDisposition Call Doctor Care Advice Given Per Guideline SEE PCP WITHIN 24 HOURS: * IF  OFFICE WILL BE OPEN: You need to be examined within the next 24 hours. Call your doctor (or NP/PA) when the office opens and make an appointment. CALL BACK IF: * You become worse Comments User: Dannielle Burn, RN Date/Time (Eastern Time): 01/20/2020 10:11:59 AM pt states she went to urgent care in [place] and the x ray machine is broken User: Dannielle Burn, RN Date/Time Eilene Ghazi Time): 01/20/2020 10:12:42 AM Pt states she does not want to make appt for tomorrow am but will see how she is feeling tomorrow Referrals [place] UNDECIDED

## 2020-01-21 ENCOUNTER — Telehealth: Payer: Self-pay | Admitting: Family Medicine

## 2020-01-21 NOTE — Telephone Encounter (Signed)
Left message for patient to call back and schedule Medicare Annual Wellness Visit (AWV) either virtually OR in office.   Last AWV 12/18/18; please schedule at anytime with [facility]Nurse Health Advisor at [facility].  This should be a 45 minute visit.

## 2020-01-21 NOTE — Telephone Encounter (Signed)
Called patient and she states she will just wait until after the holiday to see how she is feeling and will call back if anything changes.

## 2020-01-30 ENCOUNTER — Other Ambulatory Visit: Payer: Self-pay

## 2020-01-30 ENCOUNTER — Encounter: Payer: Self-pay | Admitting: Family Medicine

## 2020-01-30 ENCOUNTER — Ambulatory Visit (INDEPENDENT_AMBULATORY_CARE_PROVIDER_SITE_OTHER): Payer: Medicare Other | Admitting: Family Medicine

## 2020-01-30 VITALS — BP 90/62 | HR 121 | Temp 98.2°F | Ht 64.0 in | Wt 219.0 lb

## 2020-01-30 DIAGNOSIS — R0781 Pleurodynia: Secondary | ICD-10-CM

## 2020-01-30 MED ORDER — MELOXICAM 15 MG PO TABS: 15.0000 mg | ORAL_TABLET | Freq: Every day | ORAL | 0 refills | Status: DC

## 2020-01-30 MED ORDER — CYCLOBENZAPRINE HCL 10 MG PO TABS: 10.0000 mg | ORAL_TABLET | Freq: Three times a day (TID) | ORAL | 0 refills | Status: DC | PRN

## 2020-01-30 MED ORDER — HYDROCODONE-ACETAMINOPHEN 5-325 MG PO TABS: 1.0000 | ORAL_TABLET | Freq: Four times a day (QID) | ORAL | 0 refills | Status: AC | PRN

## 2020-01-30 NOTE — Progress Notes (Signed)
   Emily [name] is a 59 y.o. female who presents today for an office visit.  Assessment/Plan:  New/Acute Problems: Right Rib Pain Concern for fracture. No red flags. Will confirm fracture with plain film. Start pain control with meloxicam and Flexeril. Also send in small supply of Norco to use as needed for breakthrough pain. Anticipate resolution over the next few weeks. Discussed reasons to return to care. Follow-up as needed.    Subjective:  HPI:  Patient here with left-sided rib pain for the last month or so. She fell down about 5 concrete stairs or so a month ago. States she lost balance and fell backwards. She had a lot of bruising to her back and arms. That is all essentially resolved though still has persistent pain to her right rib. Worse with coughing worse with these menstruation. No chest pain or shortness of breath. No reported weakness or numbness.       Objective:  Physical Exam: BP 90/62   Pulse (!) 121   Temp 98.2 F (36.8 C) (Temporal)   Ht 5\' 4"  (1.626 m)   Wt 219 lb (99.3 kg)   SpO2 95%   BMI 37.59 kg/m   Gen: No acute distress, resting comfortably CV: Regular rate and rhythm with no murmurs appreciated Pulm: Normal work of breathing, clear to auscultation bilaterally with no crackles, wheezes, or rhonchi MSK: No deformities. Tender to palpation along right lateral rib wall. Neuro: Grossly normal, moves all extremities Psych: Normal affect and thought content      Emily [name] M. Jerline Pain, MD 01/30/2020 10:53 AM

## 2020-01-30 NOTE — Patient Instructions (Signed)
It was very nice to see you today!  You may have fractured a rib. We will check an x-ray to confirm this. You can get this done at any pain  I will send in medications that help you with pain management. Please take meloxicam daily. He can use the Norco and Flexeril as needed. Do not take any Advil or ibuprofen while doing this.  Take care, Dr Jerline Pain  Please try these tips to maintain a healthy lifestyle:   Eat at least 3 REAL meals and 1-2 snacks per day.  Aim for no more than 5 hours between eating.  If you eat breakfast, please do so within one hour of getting up.    Each meal should contain half fruits/vegetables, one quarter protein, and one quarter carbs (no bigger than a computer mouse)   Cut down on sweet beverages. This includes juice, soda, and sweet tea.     Drink at least 1 glass of water with each meal and aim for at least 8 glasses per day   Exercise at least 150 minutes every week.

## 2020-02-02 ENCOUNTER — Other Ambulatory Visit: Payer: Self-pay

## 2020-02-02 ENCOUNTER — Ambulatory Visit (HOSPITAL_COMMUNITY)
Admission: RE | Admit: 2020-02-02 | Discharge: 2020-02-02 | Disposition: A | Discharge status: Home / Self Care | Payer: Medicare Other | Source: Ambulatory Visit | Attending: Family Medicine | Admitting: Family Medicine

## 2020-02-02 ENCOUNTER — Other Ambulatory Visit: Payer: Self-pay | Admitting: Family Medicine

## 2020-02-02 DIAGNOSIS — R0781 Pleurodynia: Secondary | ICD-10-CM

## 2020-02-03 NOTE — Progress Notes (Signed)
Please inform patient of the following:  Xray shows no acute fractures. Would like for her to let us know if pain is not improving.  Emily [name]. Jerline Pain, MD 02/03/2020 9:30 AM

## 2020-02-06 ENCOUNTER — Telehealth (HOSPITAL_COMMUNITY): Payer: Self-pay | Admitting: Pharmacy Technician

## 2020-02-06 NOTE — Telephone Encounter (Signed)
Spoke to patient regarding re-enrollment of Entresto assistance with [employer]. Will mail the application for the patient to sign.   Asked her if she wanted me to send her a Scientist, water quality for [place] as well. She stated that someone at the health department was already helping her with that. Advised her to call me if she did need assistance with this medication as well in the future.  Will follow up.

## 2020-02-09 ENCOUNTER — Other Ambulatory Visit: Payer: Self-pay | Admitting: Family Medicine

## 2020-02-09 ENCOUNTER — Encounter: Payer: Self-pay | Admitting: Family Medicine

## 2020-02-09 NOTE — Telephone Encounter (Signed)
Last OV 01/05/20 Last refill 11/13/19 #90/0 Next OV not scheduled

## 2020-02-10 MED ORDER — HYDROCODONE-ACETAMINOPHEN 5-325 MG PO TABS: 1.0000 | ORAL_TABLET | Freq: Four times a day (QID) | ORAL | 0 refills | Status: DC | PRN

## 2020-02-10 NOTE — Telephone Encounter (Signed)
Call Patient Stated unable to refill Rx Hydrocodone At [place]. Requesting For Rx be send at [facility] in [place]

## 2020-02-11 NOTE — Telephone Encounter (Signed)
Received a notice from [employer] that they are missing the patient's insurance information. I have not received the patient's portion of the application. Went ahead and sent provider's portion, and insurance card.  Will follow up.

## 2020-02-13 ENCOUNTER — Other Ambulatory Visit (HOSPITAL_COMMUNITY): Payer: Self-pay

## 2020-02-13 MED ORDER — ENTRESTO 97-103 MG PO TABS
1.0000 | ORAL_TABLET | Freq: Two times a day (BID) | ORAL | 3 refills | Status: DC
Start: 2020-02-13 — End: 2020-11-16

## 2020-02-13 MED ORDER — ENTRESTO 97-103 MG PO TABS
1.0000 | ORAL_TABLET | Freq: Two times a day (BID) | ORAL | 3 refills | Status: DC
Start: 2020-02-13 — End: 2020-02-13

## 2020-02-13 NOTE — Addendum Note (Signed)
Addended by: Malena Edman on: 02/13/2020 10:39 AM   Modules accepted: Orders

## 2020-02-14 ENCOUNTER — Other Ambulatory Visit: Payer: Self-pay | Admitting: Family Medicine

## 2020-02-18 ENCOUNTER — Other Ambulatory Visit: Payer: Self-pay | Admitting: *Deleted

## 2020-02-18 MED ORDER — HYDROXYZINE HCL 50 MG PO TABS: ORAL_TABLET | ORAL | 0 refills | Status: DC

## 2020-02-19 ENCOUNTER — Other Ambulatory Visit (HOSPITAL_COMMUNITY): Payer: Self-pay | Admitting: Internal Medicine

## 2020-02-24 ENCOUNTER — Encounter: Payer: Self-pay | Admitting: Family Medicine

## 2020-02-24 ENCOUNTER — Telehealth (INDEPENDENT_AMBULATORY_CARE_PROVIDER_SITE_OTHER): Payer: Medicare Other | Admitting: Family Medicine

## 2020-02-24 VITALS — Ht 64.0 in | Wt 171.0 lb

## 2020-02-24 DIAGNOSIS — R109 Unspecified abdominal pain: Secondary | ICD-10-CM | POA: Diagnosis not present

## 2020-02-24 MED ORDER — HYDROCODONE-ACETAMINOPHEN 5-325 MG PO TABS: 1.0000 | ORAL_TABLET | Freq: Four times a day (QID) | ORAL | 0 refills | Status: DC | PRN

## 2020-02-24 MED ORDER — CYCLOBENZAPRINE HCL 10 MG PO TABS: 10.0000 mg | ORAL_TABLET | Freq: Three times a day (TID) | ORAL | 0 refills | Status: DC | PRN

## 2020-02-24 NOTE — Progress Notes (Signed)
   Emily [name] is a 59 y.o. female who presents today for a virtual office visit.  Assessment/Plan:  Abdominal Pain Discussed limitations of virtual visit and inability perform physical exam.  Likely represents recurrent pancreatitis.  Will attempt obtain labs including CBC, CMET, TSH, and lipase.  Patient does not wish to go to the hospital at this point.  Given the mild nature of her symptoms I think this is reasonable.  We will give a small supply of Norco.  Encouraged bowel rest and good oral hydration.  Discussed reasons to return to care and seek emergent care.    Subjective:  HPI:  Patient here with abdominal pain for the last couple of days.  Feels very similar to prior episodes of pancreatitis.  She has had recurrent pancreatitis multiple times in the past.  She was recently started on meloxicam and thinks this may have contributed.  She has not had any other medication changes.  Minimal nausea but is having more abdominal and back pain consistent with prior flares.  She does not want to go to the hospital at this point as her symptoms are currently not severe.  States that she has experienced this before and typically does have it with it if it flares up.       Objective/Observations  Physical Exam: Gen: NAD, resting comfortably Pulm: Normal work of breathing Neuro: Grossly normal, moves all extremities Psych: Normal affect and thought content  Virtual Visit via Video   I connected with [name] on 02/24/20 at  4:00 PM EST by a video enabled telemedicine application and verified that I am speaking with the correct person using two identifiers. The limitations of evaluation and management by telemedicine and the availability of in person appointments were discussed. The patient expressed understanding and agreed to proceed.   Patient location: Home Provider location: [place] [employer] Persons participating in the virtual visit: Myself and Patient      Katina Degree. Jimmey Ralph, MD 02/24/2020 3:53 PM

## 2020-02-29 ENCOUNTER — Encounter: Payer: Self-pay | Admitting: Family Medicine

## 2020-03-01 NOTE — Telephone Encounter (Signed)
Please advise 

## 2020-03-14 ENCOUNTER — Other Ambulatory Visit: Payer: Self-pay | Admitting: Family Medicine

## 2020-03-16 MED ORDER — LORAZEPAM 2 MG PO TABS: 2.0000 mg | ORAL_TABLET | Freq: Every evening | ORAL | 0 refills | Status: DC | PRN

## 2020-03-18 ENCOUNTER — Other Ambulatory Visit: Payer: Self-pay | Admitting: Family Medicine

## 2020-03-18 DIAGNOSIS — R062 Wheezing: Secondary | ICD-10-CM

## 2020-03-18 MED ORDER — ALBUTEROL SULFATE HFA 108 (90 BASE) MCG/ACT IN AERS: 2.0000 | INHALATION_SPRAY | Freq: Four times a day (QID) | RESPIRATORY_TRACT | 0 refills | Status: DC | PRN

## 2020-03-25 ENCOUNTER — Telehealth (HOSPITAL_COMMUNITY): Payer: Self-pay | Admitting: Pharmacy Technician

## 2020-03-25 NOTE — Telephone Encounter (Addendum)
Advanced Heart Failure Patient Advocate Encounter   Patient was approved to receive Entresto from [employer]  Patient ID: [id number] Effective dates: 03/17/20 through 02/26/21  Called and spoke with patient.  Charlann Boxer, CPhT

## 2020-04-07 ENCOUNTER — Telehealth (HOSPITAL_COMMUNITY): Payer: Self-pay

## 2020-04-07 NOTE — Telephone Encounter (Deleted)
Advanced Heart Failure Patient Advocate Encounter   Patient was approved to receive Entresto from [employer]  Patient ID: [id number] Effective dates: 03/17/2020 through 02/26/2021  Left message.  Penni Homans, Student-PharmD

## 2020-04-12 NOTE — Telephone Encounter (Signed)
Opened in error

## 2020-04-12 NOTE — Telephone Encounter (Signed)
Encounter opened in error

## 2020-05-05 ENCOUNTER — Telehealth (HOSPITAL_COMMUNITY): Payer: Self-pay | Admitting: Pharmacy Technician

## 2020-05-05 NOTE — Telephone Encounter (Signed)
Received a message from the patient that [employer] did not send her Entresto. It looks like someone called and placed a refill for her on 02/21 and it was sent to an old address. I confirm that the [employer] address in [place] is the correct address for her. The patient also advised in her message that she was out of [place] now. [employer] representative stated that the patient would have to call in and speak with the pharmacy to get a replacement sent to her. They would not allow me to order it for her.  Will provide her a bottle of samples if she can come and pick it up. Called both phone numbers that we have on file for the patient and there was no way to leave a message.   Charlann Boxer, CPhT

## 2020-05-08 ENCOUNTER — Other Ambulatory Visit: Payer: Self-pay | Admitting: Family Medicine

## 2020-05-08 ENCOUNTER — Other Ambulatory Visit (HOSPITAL_COMMUNITY): Payer: Self-pay | Admitting: Internal Medicine

## 2020-06-08 ENCOUNTER — Ambulatory Visit (INDEPENDENT_AMBULATORY_CARE_PROVIDER_SITE_OTHER): Payer: Medicare Other | Admitting: Family Medicine

## 2020-06-08 ENCOUNTER — Encounter: Payer: Self-pay | Admitting: Family Medicine

## 2020-06-08 ENCOUNTER — Other Ambulatory Visit: Payer: Self-pay

## 2020-06-08 VITALS — BP 91/61 | HR 94 | Temp 97.8°F | Ht 64.0 in | Wt 157.6 lb

## 2020-06-08 DIAGNOSIS — G4700 Insomnia, unspecified: Secondary | ICD-10-CM

## 2020-06-08 DIAGNOSIS — E1149 Type 2 diabetes mellitus with other diabetic neurological complication: Secondary | ICD-10-CM | POA: Diagnosis not present

## 2020-06-08 LAB — POCT GLYCOSYLATED HEMOGLOBIN (HGB A1C): Hemoglobin A1C: 9.7 % — AB (ref 4.0–5.6)

## 2020-06-08 MED ORDER — EMPAGLIFLOZIN 25 MG PO TABS: 25.0000 mg | ORAL_TABLET | Freq: Every day | ORAL | 3 refills | Status: DC

## 2020-06-08 MED ORDER — LORAZEPAM 2 MG PO TABS: 2.0000 mg | ORAL_TABLET | Freq: Every evening | ORAL | 1 refills | Status: DC | PRN

## 2020-06-08 NOTE — Assessment & Plan Note (Signed)
Takes hydroxyzine with melatonin.  She has tried several other medications including trazodone with no improvement.  She does not wish to start an additional medication at this point.  May consider trial of ramelteon in the future if continues to be an issue.

## 2020-06-08 NOTE — Assessment & Plan Note (Signed)
A1c not controlled 9.7.  Will increase Jardiance to 25 mg daily.  Continue Metformin 1000 mg twice daily.  Recheck A1c in 3 months.  May need to start GLP-1 agonist depending on results of next A1c.  Continue lifestyle modifications.  She has the good job of cutting down sweets.

## 2020-06-08 NOTE — Patient Instructions (Signed)
It was very nice to see you today!  Please increase your Jardiance to 25 mg daily.  No other changes today.  I will see back in 3 months for your annual wellness visit.  Come back to see me sooner if needed.  Take care, Dr Jerline Pain  PLEASE NOTE:  If you had any lab tests please let us know if you have not heard back within a few days. You may see your results on mychart before we have a chance to review them but we will give you a call once they are reviewed by [place]. If we ordered any referrals today, please let us know if you have not heard from their office within the next week.   Please try these tips to maintain a healthy lifestyle:   Eat at least 3 REAL meals and 1-2 snacks per day.  Aim for no more than 5 hours between eating.  If you eat breakfast, please do so within one hour of getting up.    Each meal should contain half fruits/vegetables, one quarter protein, and one quarter carbs (no bigger than a computer mouse)   Cut down on sweet beverages. This includes juice, soda, and sweet tea.     Drink at least 1 glass of water with each meal and aim for at least 8 glasses per day   Exercise at least 150 minutes every week.

## 2020-06-08 NOTE — Progress Notes (Signed)
   Emily [name] is a 60 y.o. female who presents today for an office visit.  Assessment/Plan:  Chronic Problems Addressed Today: Type 2 diabetes mellitus with neurological complications (HCC) P4D not controlled 9.7.  Will increase Jardiance to 25 mg daily.  Continue Metformin 1000 mg twice daily.  Recheck A1c in 3 months.  May need to start GLP-1 agonist depending on results of next A1c.  Continue lifestyle modifications.  She has the good job of cutting down sweets.  Insomnia Takes hydroxyzine with melatonin.  She has tried several other medications including trazodone with no improvement.  She does not wish to start an additional medication at this point.  May consider trial of ramelteon in the future if continues to be an issue.     Subjective:  HPI:  See a/p.         Objective:  Physical Exam: BP 91/61   Pulse 94   Temp 97.8 F (36.6 C) (Temporal)   Ht 5\' 4"  (1.626 m)   Wt 157 lb 9.6 oz (71.5 kg)   SpO2 94%   BMI 27.05 kg/m   Wt Readings from Last 3 Encounters:  06/08/20 157 lb 9.6 oz (71.5 kg)  02/24/20 171 lb (77.6 kg)  01/30/20 219 lb (99.3 kg)  Gen: No acute distress, resting comfortably CV: Regular rate and rhythm with no murmurs appreciated Pulm: Normal work of breathing, clear to auscultation bilaterally with no crackles, wheezes, or rhonchi Neuro: Grossly normal, moves all extremities Psych: Normal affect and thought content      Holliday Sheaffer M. Jerline Pain, MD 06/08/2020 2:19 PM

## 2020-06-10 ENCOUNTER — Encounter (HOSPITAL_COMMUNITY): Payer: Medicare Other | Admitting: Internal Medicine

## 2020-06-13 NOTE — Progress Notes (Signed)
Patient ID: Emily [name], female   DOB: [birth year], 60 y.o.   MRN: [id number]      Patient did not show for appt. Note left for templating purposes only.     ADVANCED HF CLINIC NOTE  PCP: Betha Loa, MD Primary Cardiologist: Dr. Haroldine Laws Oncologist: Dr Tami Lin   Ms. Polgar is a 60 yo woman with a history of ER + R breast cancer s/p R lumpectomy, DM2, cholelithiasis s/p cholecystectomy (02/2013), COPD and systolic CHF due to nonischemic cardiomyopathy.    Patient was admitted in 8/14 with acute pulmonary edema and cardiogenic shock.  LHC showed moderate nonobstructive CAD (40-50% mLAD, 50-70% LCx, 50% PDA).  She was started on milrinone and diuresed.  Cause of cardiomyopathy suspected to be Adriamycin toxicity.    12/05/12 Carotid Dopplers- R 39 % stenosis and L  40-59% stenosis   Had surgery 4/17 in [place] to remove R chest leiomyosarcoma. Margins not clear so underwent repeat resection here with Dr. Barry Dienes.    Admitted 5/17 with recurrent HF. Found to have EF 10-15% with NYHA IIIB-IV symptoms. UDS + cocaine. Cath showed non-obstructive CAD with Cardiogenic shock initial PA sat 34%. Started on milrinone. Diuresed about 5 pounds. Milrinone weaned off and co-ox dropped down to 52% but was feeling ok.   Echo 12/22/19: EF 50-55%   Today she returns for HF follow up. Doing pretty well. Denies SOB, edema, orthopnea or PND. Struggling with RSD. Compliant with all meds. Still smoking 1ppd.     PFTs 1/19 FEV1 1.23 (45%) FVC 1.69 (48%) DLCO 61%  Echo 10/27/12 EF 20-25% Echo 01/09/13 EF 30-35% Echo 06/05/13 EF 45-50% septum dysynnergic Echo 5/17: EF 15-20% Echo 12/01/15 LVEF 55-60% Echo 02/2017 EF 40-45%. Echo 02/2018 EF 55-60%   Cath 07/14/15 Ao = 106/72 (87) LV =  108/28 (36) RA =  22 RV = 60/14/25 RA = 67/22 (51) PCW = 31 Fick cardiac output/index = 2.8/1.6 SVR = 1823 PVR = 7.0 WU FA sat = 90% PA sat = 34%, 42%  Assessment: 1. Mild non-obstructive CAD 2. Severe NICM  with EF 25% by echo 3. Cardiogenic shock physiology with markedly elevated biventricular filling pressures Lexiscan 09/23/13: EF 50% normal perfusion Holter: SR occasional PVCs  PMH: 1. COPD: PFTs (1/14) with FEV1 68%.  Quit smoking 9/14.  2. Type II diabetes  3. HTN 4. Breast cancer: 2009, s/p Adriamycin/Cytoxan/Taxol chemo, lumpectomy and radiation.   5. Nonischemic cardiomyopathy: Possibly due to Adriamycin.  Echo (8/14) with EF 20-25%, global hypokinesis, moderate diastolic dysfunction, normal RV size and with mild to moderately decreased systolic function, moderate TR.   6. CAD: LHC (8/14) with nonobstructive CAD; 40-50% mLAD, 50-70% LCx, 50% PDA.  7. Hyperlipidemia  SH:   Mother is next door.  Still smoking.   FH: No premature CAD.  No family history of cardiomyopathy.   Review of systems complete and found to be negative unless listed in HPI.    Current Outpatient Medications  Medication Sig Dispense Refill   albuterol (VENTOLIN HFA) 108 (90 Base) MCG/ACT inhaler Inhale 2 puffs into the lungs every 6 (six) hours as needed for wheezing or shortness of breath. 1 each 0   aspirin EC 81 MG tablet Take 1 tablet (81 mg total) by mouth daily. 90 tablet 3   atorvastatin (LIPITOR) 20 MG tablet Take 1 tablet by mouth once daily 90 tablet 3   carvedilol (COREG) 6.25 MG tablet TAKE 1 TABLET BY MOUTH TWICE DAILY WITH A MEAL 180  tablet 3   Cholecalciferol 125 MCG (5000 UT) TABS Vitamin D3 125 mcg (5,000 unit) tablet  Take by oral route.     citalopram (CELEXA) 40 MG tablet Take 1 tablet by mouth once daily 90 tablet 1   Cyanocobalamin (VITAMIN B 12 PO) Take by mouth daily.     empagliflozin (JARDIANCE) 25 MG TABS tablet Take 1 tablet (25 mg total) by mouth daily before breakfast. 90 tablet 3   fluorouracil (EFUDEX) 5 % cream Apply topically 2 (two) times daily.     furosemide (LASIX) 20 MG tablet Take 1 tablet (20 mg total) by mouth daily as needed for edema. 180 tablet 0   hydrOXYzine  (ATARAX/VISTARIL) 50 MG tablet TAKE 1 TO 2 TABLETS BY MOUTH THREE TIMES DAILY AS NEEDED FOR ITCHING . APPOINTMENT REQUIRED FOR FUTURE REFILLS 90 tablet 0   LORazepam (ATIVAN) 2 MG tablet Take 1 tablet (2 mg total) by mouth at bedtime as needed for anxiety. 90 tablet 1   metFORMIN (GLUCOPHAGE) 1000 MG tablet Take 1 tablet (1,000 mg total) by mouth 2 (two) times daily with a meal. 180 tablet 3   ondansetron (ZOFRAN ODT) 4 MG disintegrating tablet Take 1 tablet (4 mg total) by mouth every 8 (eight) hours as needed for nausea or vomiting. 10 tablet 0   ONETOUCH ULTRA test strip USE TO CHECK BLOOD SUGAR THREE TIMES DAILY 100 each 0   sacubitril-valsartan (ENTRESTO) 97-103 MG Take 1 tablet by mouth 2 (two) times daily. 180 tablet 3   No current facility-administered medications for this encounter.    There were no vitals filed for this visit. Wt Readings from Last 3 Encounters:  06/08/20 71.5 kg (157 lb 9.6 oz)  02/24/20 77.6 kg (171 lb)  01/30/20 99.3 kg (219 lb)    General:  Well appearing. No resp difficulty HEENT: normal Neck: supple. no JVD. Carotids 2+ bilat; no bruits. No lymphadenopathy or thryomegaly appreciated. Cor: PMI nondisplaced. Regular rate & rhythm. No rubs, gallops or murmurs. Lungs: clear with decreased BS Abdomen: soft, nontender, nondistended. No hepatosplenomegaly. No bruits or masses. Good bowel sounds. Extremities: no cyanosis, clubbing, rash, edema Neuro: alert & orientedx3, cranial nerves grossly intact. moves all 4 extremities w/o difficulty. Affect pleasant  Assessment/Plan:  1. Chronic systolic CHF: NICM, likely Adriamycin toxicity, - Echo 8/14 EF 20-25% - Echo 5/17 EF 15% - Echo 02/2017 EF 40-45%. - Echo 02/2018 EF 55-60% - Echo 12/22/19 EF 50-55% Personally reviewed - NYHA II but likely more due to COPD than HF - Volume status ok - Continue Entresto 97/103.  -Continue spiro 12.5 mg daily. Unable to tolerate 25 daily due to hyperK - Continue carvedilol to  6.25 BID  - wants to discuss Jardiance with PCP.   2. CAD: Moderate nonobstructive disease. - No s/s ischemia. Continue ASA and statin 3. COPD/tobacco use -Smoking 1ppd. Encouraged abstinence.  4. Cocaine use - Remains abstinent 5. Breast Cancer: - Previously received adriamycin. Cancer free for > 5 years. No change. 6. Carotid stenosis:  - Asymptomatic  - Mild 1-39% 2/20  - On statin.  7. R chest leiomyosarcoma: - Had further resections and margins now clear.  - Sees Dr. Alen Blew and Dr. Barry Dienes. - No change 8. DM2 - Will discuss Jardiance with Dr. Minette Brine, MD  8:27 PM

## 2020-06-14 ENCOUNTER — Other Ambulatory Visit (HOSPITAL_COMMUNITY): Payer: Self-pay

## 2020-06-14 ENCOUNTER — Inpatient Hospital Stay (HOSPITAL_COMMUNITY)
Admission: RE | Admit: 2020-06-14 | Discharge: 2020-06-14 | Disposition: A | Discharge status: Home / Self Care | Payer: Medicare Other | Source: Ambulatory Visit | Attending: Internal Medicine | Admitting: Internal Medicine

## 2020-06-14 DIAGNOSIS — E785 Hyperlipidemia, unspecified: Secondary | ICD-10-CM

## 2020-06-14 DIAGNOSIS — I5022 Chronic systolic (congestive) heart failure: Secondary | ICD-10-CM

## 2020-06-14 MED ORDER — ATORVASTATIN CALCIUM 20 MG PO TABS: 1.0000 | ORAL_TABLET | Freq: Every day | ORAL | 1 refills | Status: DC

## 2020-06-21 ENCOUNTER — Encounter: Payer: Self-pay | Admitting: Family Medicine

## 2020-06-22 NOTE — Telephone Encounter (Signed)
Medication has already been sent [place]. I spoke with the pt to have her call [place] to see if medication can be transferred to location below. Pt will call the office back to update.

## 2020-06-23 NOTE — Telephone Encounter (Signed)
Spoke with patient, notified Rx Jardiance faxed and mail to health department

## 2020-06-29 ENCOUNTER — Telehealth: Payer: Self-pay | Admitting: Family Medicine

## 2020-06-29 NOTE — Chronic Care Management (AMB) (Signed)
  Chronic Care Management   Outreach Note  06/29/2020 Name: Emily [name] MRN: [id number] DOB: 1960-09-09  Referred by: [name], MD Reason for referral : No chief complaint on file.   An unsuccessful telephone outreach was attempted today. The patient was referred to the pharmacist for assistance with care management and care coordination.   Follow Up Plan:   Lauretta Grill [facility] Scheduler

## 2020-07-28 ENCOUNTER — Other Ambulatory Visit: Payer: Self-pay | Admitting: Family Medicine

## 2020-07-29 ENCOUNTER — Encounter (HOSPITAL_COMMUNITY): Payer: Self-pay | Admitting: Internal Medicine

## 2020-07-29 ENCOUNTER — Other Ambulatory Visit: Payer: Self-pay

## 2020-07-29 ENCOUNTER — Ambulatory Visit (HOSPITAL_COMMUNITY)
Admission: RE | Admit: 2020-07-29 | Discharge: 2020-07-29 | Disposition: A | Discharge status: Home / Self Care | Payer: Medicare Other | Source: Ambulatory Visit | Attending: Internal Medicine | Admitting: Internal Medicine

## 2020-07-29 VITALS — BP 104/66 | HR 91 | Wt 153.0 lb

## 2020-07-29 DIAGNOSIS — Z7982 Long term (current) use of aspirin: Secondary | ICD-10-CM | POA: Diagnosis not present

## 2020-07-29 DIAGNOSIS — F17210 Nicotine dependence, cigarettes, uncomplicated: Secondary | ICD-10-CM | POA: Insufficient documentation

## 2020-07-29 DIAGNOSIS — Z7984 Long term (current) use of oral hypoglycemic drugs: Secondary | ICD-10-CM | POA: Diagnosis not present

## 2020-07-29 DIAGNOSIS — I5032 Chronic diastolic (congestive) heart failure: Secondary | ICD-10-CM | POA: Diagnosis not present

## 2020-07-29 DIAGNOSIS — I428 Other cardiomyopathies: Secondary | ICD-10-CM | POA: Insufficient documentation

## 2020-07-29 DIAGNOSIS — E119 Type 2 diabetes mellitus without complications: Secondary | ICD-10-CM | POA: Diagnosis not present

## 2020-07-29 DIAGNOSIS — I2510 Atherosclerotic heart disease of native coronary artery without angina pectoris: Secondary | ICD-10-CM | POA: Insufficient documentation

## 2020-07-29 DIAGNOSIS — Z853 Personal history of malignant neoplasm of breast: Secondary | ICD-10-CM | POA: Diagnosis not present

## 2020-07-29 DIAGNOSIS — E785 Hyperlipidemia, unspecified: Secondary | ICD-10-CM | POA: Diagnosis not present

## 2020-07-29 DIAGNOSIS — Z79899 Other long term (current) drug therapy: Secondary | ICD-10-CM | POA: Diagnosis not present

## 2020-07-29 DIAGNOSIS — I110 Hypertensive heart disease with heart failure: Secondary | ICD-10-CM | POA: Diagnosis not present

## 2020-07-29 DIAGNOSIS — J449 Chronic obstructive pulmonary disease, unspecified: Secondary | ICD-10-CM | POA: Diagnosis not present

## 2020-07-29 DIAGNOSIS — I5022 Chronic systolic (congestive) heart failure: Secondary | ICD-10-CM

## 2020-07-29 DIAGNOSIS — Z9221 Personal history of antineoplastic chemotherapy: Secondary | ICD-10-CM | POA: Insufficient documentation

## 2020-07-29 DIAGNOSIS — E1149 Type 2 diabetes mellitus with other diabetic neurological complication: Secondary | ICD-10-CM | POA: Diagnosis not present

## 2020-07-29 DIAGNOSIS — Z85831 Personal history of malignant neoplasm of soft tissue: Secondary | ICD-10-CM | POA: Diagnosis not present

## 2020-07-29 LAB — CBC
HCT: 46.3 % — ABNORMAL HIGH (ref 36.0–46.0)
Hemoglobin: 14.5 g/dL (ref 12.0–15.0)
MCH: 27.9 pg (ref 26.0–34.0)
MCHC: 31.3 g/dL (ref 30.0–36.0)
MCV: 89.2 fL (ref 80.0–100.0)
Platelets: 193 10*3/uL (ref 150–400)
RBC: 5.19 MIL/uL — ABNORMAL HIGH (ref 3.87–5.11)
RDW: 13.2 % (ref 11.5–15.5)
WBC: 9.3 10*3/uL (ref 4.0–10.5)
nRBC: 0 % (ref 0.0–0.2)

## 2020-07-29 LAB — BASIC METABOLIC PANEL
Anion gap: 11 (ref 5–15)
BUN: 18 mg/dL (ref 8–23)
CO2: 25 mmol/L (ref 22–32)
Calcium: 8.6 mg/dL — ABNORMAL LOW (ref 8.9–10.3)
Chloride: 99 mmol/L (ref 98–111)
Creatinine, Ser: 1.24 mg/dL — ABNORMAL HIGH (ref 0.44–1.00)
GFR, Estimated: 50 mL/min — ABNORMAL LOW (ref >60)
Glucose, Bld: 236 mg/dL — ABNORMAL HIGH (ref 70–99)
Potassium: 5.1 mmol/L (ref 3.5–5.1)
Sodium: 135 mmol/L (ref 135–145)

## 2020-07-29 LAB — TSH: TSH: 1.61 u[IU]/mL (ref 0.350–4.500)

## 2020-07-29 NOTE — Addendum Note (Signed)
Encounter addended by: Micki Riley, RN on: 07/29/2020 4:21 PM  Actions taken: Clinical Note Signed, Visit diagnoses modified, Order list changed, Diagnosis association updated

## 2020-07-29 NOTE — Patient Instructions (Signed)
  Good to see you!  Labs today --we will call you with abnormal values.  Echocardiogram  6 month follow-up appt.  Call clinic if you don't hear from [place].

## 2020-07-29 NOTE — Progress Notes (Signed)
Patient ID: Emily [name], female   DOB: 30-May-1960, 60 y.o.   MRN: [id number]      Patient did not show for appt. Note left for templating purposes only.     ADVANCED HF CLINIC NOTE  PCP: Betha Loa, MD Primary Cardiologist: Dr. Haroldine Laws Oncologist: Dr Tami Lin   Ms. Rice is a 60 yo woman with a history of ER + R breast cancer s/p R lumpectomy, DM2, cholelithiasis s/p cholecystectomy (02/2013), COPD and systolic CHF due to nonischemic cardiomyopathy.    Patient was admitted in 8/14 with acute pulmonary edema and cardiogenic shock.  LHC showed moderate nonobstructive CAD (40-50% mLAD, 50-70% LCx, 50% PDA).  She was started on milrinone and diuresed.  Cause of cardiomyopathy suspected to be Adriamycin toxicity.    12/05/12 Carotid Dopplers- R 39 % stenosis and L  40-59% stenosis   Had surgery 4/17 in [place] to remove R chest leiomyosarcoma. Margins not clear so underwent repeat resection here with Dr. Barry Dienes.    Admitted 5/17 with recurrent HF. Found to have EF 10-15% with NYHA IIIB-IV symptoms. UDS + cocaine. Cath showed non-obstructive CAD with Cardiogenic shock initial PA sat 34%. Started on milrinone. Diuresed about 5 pounds. Milrinone weaned off and co-ox dropped down to 52% but was feeling ok.   Echo 12/22/19: EF 50-55%   Today she returns for HF follow up. Says she feels very tired over th past few weeks. No CP. Mild SOB. No edema, orthopnea or PND. Not using drugs. Smoking 1 ppd.     PFTs 1/19 FEV1 1.23 (45%) FVC 1.69 (48%) DLCO 61%  Echo 10/27/12 EF 20-25% Echo 01/09/13 EF 30-35% Echo 06/05/13 EF 45-50% septum dysynnergic Echo 5/17: EF 15-20% Echo 12/01/15 LVEF 55-60% Echo 02/2017 EF 40-45%. Echo 02/2018 EF 55-60%   Cath 07/14/15 Ao = 106/72 (87) LV =  108/28 (36) RA =  22 RV = 60/14/25 RA = 67/22 (51) PCW = 31 Fick cardiac output/index = 2.8/1.6 SVR = 1823 PVR = 7.0 WU FA sat = 90% PA sat = 34%, 42%  Assessment: 1. Mild non-obstructive CAD 2. Severe  NICM with EF 25% by echo 3. Cardiogenic shock physiology with markedly elevated biventricular filling pressures Lexiscan 09/23/13: EF 50% normal perfusion Holter: SR occasional PVCs  PMH: 1. COPD: PFTs (1/14) with FEV1 68%.  Quit smoking 9/14.  2. Type II diabetes  3. HTN 4. Breast cancer: 2009, s/p Adriamycin/Cytoxan/Taxol chemo, lumpectomy and radiation.   5. Nonischemic cardiomyopathy: Possibly due to Adriamycin.  Echo (8/14) with EF 20-25%, global hypokinesis, moderate diastolic dysfunction, normal RV size and with mild to moderately decreased systolic function, moderate TR.   6. CAD: LHC (8/14) with nonobstructive CAD; 40-50% mLAD, 50-70% LCx, 50% PDA.  7. Hyperlipidemia  SH:   Mother is next door.  Still smoking.   FH: No premature CAD.  No family history of cardiomyopathy.   Review of systems complete and found to be negative unless listed in HPI.    Current Outpatient Medications  Medication Sig Dispense Refill  . albuterol (VENTOLIN HFA) 108 (90 Base) MCG/ACT inhaler Inhale 2 puffs into the lungs every 6 (six) hours as needed for wheezing or shortness of breath. 1 each 0  . aspirin EC 81 MG tablet Take 1 tablet (81 mg total) by mouth daily. 90 tablet 3  . atorvastatin (LIPITOR) 20 MG tablet Take 1 tablet (20 mg total) by mouth daily. 90 tablet 1  . carvedilol (COREG) 6.25 MG tablet TAKE 1 TABLET  BY MOUTH TWICE DAILY WITH A MEAL 180 tablet 3  . Cholecalciferol 125 MCG (5000 UT) TABS Vitamin D3 125 mcg (5,000 unit) tablet  Take by oral route.    . citalopram (CELEXA) 40 MG tablet Take 1 tablet by mouth once daily 90 tablet 0  . Cyanocobalamin (VITAMIN B 12 PO) Take by mouth daily.    . empagliflozin (JARDIANCE) 25 MG TABS tablet Take 1 tablet (25 mg total) by mouth daily before breakfast. 90 tablet 3  . fluorouracil (EFUDEX) 5 % cream Apply topically 2 (two) times daily.    . furosemide (LASIX) 20 MG tablet Take 1 tablet (20 mg total) by mouth daily as needed for edema. 180  tablet 0  . hydrOXYzine (ATARAX/VISTARIL) 50 MG tablet TAKE 1 TO 2 TABLETS BY MOUTH THREE TIMES DAILY AS NEEDED FOR ITCHING . APPOINTMENT REQUIRED FOR FUTURE REFILLS 90 tablet 0  . LORazepam (ATIVAN) 2 MG tablet Take 1 tablet (2 mg total) by mouth at bedtime as needed for anxiety. 90 tablet 1  . metFORMIN (GLUCOPHAGE) 1000 MG tablet Take 1 tablet (1,000 mg total) by mouth 2 (two) times daily with a meal. 180 tablet 3  . ondansetron (ZOFRAN ODT) 4 MG disintegrating tablet Take 1 tablet (4 mg total) by mouth every 8 (eight) hours as needed for nausea or vomiting. 10 tablet 0  . ONETOUCH ULTRA test strip USE TO CHECK BLOOD SUGAR THREE TIMES DAILY 100 each 0  . sacubitril-valsartan (ENTRESTO) 97-103 MG Take 1 tablet by mouth 2 (two) times daily. 180 tablet 3   No current facility-administered medications for this encounter.    Vitals:   07/29/20 1553  BP: 104/66  Pulse: 91  SpO2: 100%  Weight: 69.4 kg (153 lb)   Wt Readings from Last 3 Encounters:  07/29/20 69.4 kg (153 lb)  06/08/20 71.5 kg (157 lb 9.6 oz)  02/24/20 77.6 kg (171 lb)    General:  Pale appearing. No resp difficulty HEENT: normal Neck: supple. no JVD. Carotids 2+ bilat; no bruits. No lymphadenopathy or thryomegaly appreciated. Cor: PMI nondisplaced. Regular rate & rhythm. No rubs, gallops or murmurs. Lungs: clear Abdomen: soft, nontender, nondistended. No hepatosplenomegaly. No bruits or masses. Good bowel sounds. Extremities: no cyanosis, clubbing, rash, edema Neuro: alert & orientedx3, cranial nerves grossly intact. moves all 4 extremities w/o difficulty. Affect pleasant  Assessment/Plan:  1. Chronic systolic CHF: NICM, likely Adriamycin toxicity, - Echo 8/14 EF 20-25% - Echo 5/17 EF 15% - Echo 02/2017 EF 40-45%. - Echo 02/2018 EF 55-60% - Echo 12/22/19 EF 50-55%  - Stable NYHA II-III but likely more due to COPD than HF - Volume status ok. Probably a little low - Continue Entresto 97/103. Can cut back to 49/51 if  BP drops   -Continue spiro 12.5 mg daily. Unable to tolerate 25 daily due to hyperK - Continue carvedilol to 6.25 BID  - Continue jardiance 25 daily   - Labs today  2. CAD: Moderate nonobstructive disease. - No s/s ischemia - Continue ASA and statin 3. COPD/tobacco use -Smoking 1ppd. Encouraged her to cut back  4. Cocaine use - Remains abstinent 5. Breast Cancer: - Previously received adriamycin. Cancer free for > 5 years. No change. 6. Carotid stenosis:  - Asymptomatic  - Mild 1-39% 2/20  - On statin.  7. R chest leiomyosarcoma: - Had further resections and margins now clear.  - Sees Dr. Alen Blew and Dr. Barry Dienes. - No change 8. DM2 - On Jardaince   Glori Bickers, MD  4:12 PM

## 2020-08-02 ENCOUNTER — Other Ambulatory Visit: Payer: Self-pay | Admitting: Family Medicine

## 2020-08-10 ENCOUNTER — Other Ambulatory Visit: Payer: Self-pay | Admitting: Family Medicine

## 2020-08-10 DIAGNOSIS — R062 Wheezing: Secondary | ICD-10-CM

## 2020-08-11 MED ORDER — ALBUTEROL SULFATE HFA 108 (90 BASE) MCG/ACT IN AERS: 2.0000 | INHALATION_SPRAY | Freq: Four times a day (QID) | RESPIRATORY_TRACT | 0 refills | Status: DC | PRN

## 2020-08-20 ENCOUNTER — Ambulatory Visit (INDEPENDENT_AMBULATORY_CARE_PROVIDER_SITE_OTHER): Payer: Medicare Other

## 2020-08-20 DIAGNOSIS — Z0000 Encounter for general adult medical examination without abnormal findings: Secondary | ICD-10-CM | POA: Diagnosis not present
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
## 2020-08-20 NOTE — Patient Instructions (Addendum)
Emily [name] , Thank you for taking time to come for your Medicare Wellness Visit. I appreciate your ongoing commitment to your health goals. Please review the following plan we discussed and let me know if I can assist you in the future.   Screening recommendations/referrals: Colonoscopy: Done 08/05/19 repeat as MD orders Mammogram: Done 12/19/19 Bone Density: Done 10/05/14 Recommended yearly ophthalmology/optometry visit for glaucoma screening and checkup Recommended yearly dental visit for hygiene and checkup  Vaccinations: Influenza vaccine: Due 09/27/20 Pneumococcal vaccine: completed Tdap vaccine: done 11/15/12 due 11/16/22 Shingles vaccine: Shingrix discussed. Please contact your pharmacy for coverage information.   Covid-19: Completed 06/06/19, 02/05/20 & 07/30/20  Advanced directives: Copies in chart  Conditions/risks identified: none at this time   Next appointment: Follow up in one year for your annual wellness visit.   Preventive Care 40-64 Years, Female Preventive care refers to lifestyle choices and visits with your health care provider that can promote health and wellness. What does preventive care include? A yearly physical exam. This is also called an annual well check. Dental exams once or twice a year. Routine eye exams. Ask your health care provider how often you should have your eyes checked. Personal lifestyle choices, including: Daily care of your teeth and gums. Regular physical activity. Eating a healthy diet. Avoiding tobacco and drug use. Limiting alcohol use. Practicing safe sex. Taking low-dose aspirin daily starting at age 84. Taking vitamin and mineral supplements as recommended by your health care provider. What happens during an annual well check? The services and screenings done by your health care provider during your annual well check will depend on your age, overall health, lifestyle risk factors, and family history of disease. Counseling  Your health  care provider may ask you questions about your: Alcohol use. Tobacco use. Drug use. Emotional well-being. Home and relationship well-being. Sexual activity. Eating habits. Work and work Statistician. Method of birth control. Menstrual cycle. Pregnancy history. Screening  You may have the following tests or measurements: Height, weight, and BMI. Blood pressure. Lipid and cholesterol levels. These may be checked every 5 years, or more frequently if you are over 49 years old. Skin check. Lung cancer screening. You may have this screening every year starting at age 72 if you have a 30-pack-year history of smoking and currently smoke or have quit within the past 15 years. Fecal occult blood test (FOBT) of the stool. You may have this test every year starting at age 79. Flexible sigmoidoscopy or colonoscopy. You may have a sigmoidoscopy every 5 years or a colonoscopy every 10 years starting at age 79. Hepatitis C blood test. Hepatitis B blood test. Sexually transmitted disease (STD) testing. Diabetes screening. This is done by checking your blood sugar (glucose) after you have not eaten for a while (fasting). You may have this done every 1-3 years. Mammogram. This may be done every 1-2 years. Talk to your health care provider about when you should start having regular mammograms. This may depend on whether you have a family history of breast cancer. BRCA-related cancer screening. This may be done if you have a family history of breast, ovarian, tubal, or peritoneal cancers. Pelvic exam and Pap test. This may be done every 3 years starting at age 58. Starting at age 71, this may be done every 5 years if you have a Pap test in combination with an HPV test. Bone density scan. This is done to screen for osteoporosis. You may have this scan if you are at high  risk for osteoporosis. Discuss your test results, treatment options, and if necessary, the need for more tests with your health care  provider. Vaccines  Your health care provider may recommend certain vaccines, such as: Influenza vaccine. This is recommended every year. Tetanus, diphtheria, and acellular pertussis (Tdap, Td) vaccine. You may need a Td booster every 10 years. Zoster vaccine. You may need this after age 30. Pneumococcal 13-valent conjugate (PCV13) vaccine. You may need this if you have certain conditions and were not previously vaccinated. Pneumococcal polysaccharide (PPSV23) vaccine. You may need one or two doses if you smoke cigarettes or if you have certain conditions. Talk to your health care provider about which screenings and vaccines you need and how often you need them. This information is not intended to replace advice given to you by your health care provider. Make sure you discuss any questions you have with your health care provider. Document Released: 03/12/2015 Document Revised: 11/03/2015 Document Reviewed: 12/15/2014 Elsevier Interactive Patient Education  2017 [place] Prevention in the Home Falls can cause injuries. They can happen to people of all ages. There are many things you can do to make your home safe and to help prevent falls. What can I do on the outside of my home? Regularly fix the edges of walkways and driveways and fix any cracks. Remove anything that might make you trip as you walk through a door, such as a raised step or threshold. Trim any bushes or trees on the path to your home. Use bright outdoor lighting. Clear any walking paths of anything that might make someone trip, such as rocks or tools. Regularly check to see if handrails are loose or broken. Make sure that both sides of any steps have handrails. Any raised decks and porches should have guardrails on the edges. Have any leaves, snow, or ice cleared regularly. Use sand or salt on walking paths during winter. Clean up any spills in your garage right away. This includes oil or grease spills. What  can I do in the bathroom? Use night lights. Install grab bars by the toilet and in the tub and shower. Do not use towel bars as grab bars. Use non-skid mats or decals in the tub or shower. If you need to sit down in the shower, use a plastic, non-slip stool. Keep the floor dry. Clean up any water that spills on the floor as soon as it happens. Remove soap buildup in the tub or shower regularly. Attach bath mats securely with double-sided non-slip rug tape. Do not have throw rugs and other things on the floor that can make you trip. What can I do in the bedroom? Use night lights. Make sure that you have a light by your bed that is easy to reach. Do not use any sheets or blankets that are too big for your bed. They should not hang down onto the floor. Have a firm chair that has side arms. You can use this for support while you get dressed. Do not have throw rugs and other things on the floor that can make you trip. What can I do in the kitchen? Clean up any spills right away. Avoid walking on wet floors. Keep items that you use a lot in easy-to-reach places. If you need to reach something above you, use a strong step stool that has a grab bar. Keep electrical cords out of the way. Do not use floor polish or wax that makes floors slippery. If you  must use wax, use non-skid floor wax. Do not have throw rugs and other things on the floor that can make you trip. What can I do with my stairs? Do not leave any items on the stairs. Make sure that there are handrails on both sides of the stairs and use them. Fix handrails that are broken or loose. Make sure that handrails are as long as the stairways. Check any carpeting to make sure that it is firmly attached to the stairs. Fix any carpet that is loose or worn. Avoid having throw rugs at the top or bottom of the stairs. If you do have throw rugs, attach them to the floor with carpet tape. Make sure that you have a light switch at the top of the  stairs and the bottom of the stairs. If you do not have them, ask someone to add them for you. What else can I do to help prevent falls? Wear shoes that: Do not have high heels. Have rubber bottoms. Are comfortable and fit you well. Are closed at the toe. Do not wear sandals. If you use a stepladder: Make sure that it is fully opened. Do not climb a closed stepladder. Make sure that both sides of the stepladder are locked into place. Ask someone to hold it for you, if possible. Clearly mark and make sure that you can see: Any grab bars or handrails. First and last steps. Where the edge of each step is. Use tools that help you move around (mobility aids) if they are needed. These include: Canes. Walkers. Scooters. Crutches. Turn on the lights when you go into a dark area. Replace any light bulbs as soon as they burn out. Set up your furniture so you have a clear path. Avoid moving your furniture around. If any of your floors are uneven, fix them. If there are any pets around you, be aware of where they are. Review your medicines with your doctor. Some medicines can make you feel dizzy. This can increase your chance of falling. Ask your doctor what other things that you can do to help prevent falls. This information is not intended to replace advice given to you by your health care provider. Make sure you discuss any questions you have with your health care provider. Document Released: 12/10/2008 Document Revised: 07/22/2015 Document Reviewed: 03/20/2014 Elsevier Interactive Patient Education  2017 [employer].

## 2020-09-01 ENCOUNTER — Ambulatory Visit (HOSPITAL_COMMUNITY): Payer: Medicare Other

## 2020-09-06 ENCOUNTER — Other Ambulatory Visit: Payer: Self-pay

## 2020-09-06 ENCOUNTER — Ambulatory Visit (HOSPITAL_COMMUNITY)
Admission: RE | Admit: 2020-09-06 | Discharge: 2020-09-06 | Disposition: A | Discharge status: Home / Self Care | Payer: Medicare Other | Source: Ambulatory Visit | Attending: Internal Medicine | Admitting: Internal Medicine

## 2020-09-06 DIAGNOSIS — I2510 Atherosclerotic heart disease of native coronary artery without angina pectoris: Secondary | ICD-10-CM | POA: Diagnosis not present

## 2020-09-06 DIAGNOSIS — I7300 Raynaud's syndrome without gangrene: Secondary | ICD-10-CM | POA: Diagnosis not present

## 2020-09-06 DIAGNOSIS — I5032 Chronic diastolic (congestive) heart failure: Secondary | ICD-10-CM

## 2020-09-06 DIAGNOSIS — E785 Hyperlipidemia, unspecified: Secondary | ICD-10-CM | POA: Insufficient documentation

## 2020-09-06 DIAGNOSIS — E119 Type 2 diabetes mellitus without complications: Secondary | ICD-10-CM | POA: Diagnosis not present

## 2020-09-06 DIAGNOSIS — I110 Hypertensive heart disease with heart failure: Secondary | ICD-10-CM | POA: Insufficient documentation

## 2020-09-06 LAB — ECHOCARDIOGRAM COMPLETE
Area-P 1/2: 3.99 cm2
S' Lateral: 3.3 cm
Single Plane A4C EF: 52.3 %

## 2020-09-06 NOTE — Progress Notes (Signed)
  Echocardiogram 2D Echocardiogram has been performed.  Emily [name] 09/06/2020, 10:46 AM

## 2020-09-10 ENCOUNTER — Encounter: Payer: Medicare Other | Admitting: Family Medicine

## 2020-09-16 ENCOUNTER — Telehealth: Payer: Self-pay | Admitting: Family Medicine

## 2020-09-16 NOTE — Chronic Care Management (AMB) (Signed)
  Chronic Care Management   Outreach Note  09/16/2020 Name: Emily [name] MRN: [id number] DOB: 1960-05-05  Referred by: [name], MD Reason for referral : No chief complaint on file.   Third unsuccessful telephone outreach was attempted today. The patient was referred to the pharmacist for assistance with care management and care coordination.   Follow Up Plan:   Lauretta Grill Upstream Scheduler

## 2020-10-25 ENCOUNTER — Telehealth (INDEPENDENT_AMBULATORY_CARE_PROVIDER_SITE_OTHER): Payer: Medicare Other | Admitting: Family Medicine

## 2020-10-25 ENCOUNTER — Encounter: Payer: Self-pay | Admitting: Family Medicine

## 2020-10-25 VITALS — BP 89/59 | Temp 98.3°F

## 2020-10-25 DIAGNOSIS — N1831 Chronic kidney disease, stage 3a: Secondary | ICD-10-CM

## 2020-10-25 DIAGNOSIS — U071 COVID-19: Secondary | ICD-10-CM | POA: Diagnosis not present

## 2020-10-25 MED ORDER — NIRMATRELVIR/RITONAVIR (PAXLOVID) TABLET (RENAL DOSING): 2.0000 | ORAL_TABLET | Freq: Two times a day (BID) | ORAL | 0 refills | Status: AC
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
## 2020-11-05 ENCOUNTER — Other Ambulatory Visit: Payer: Self-pay | Admitting: Family Medicine

## 2020-11-05 ENCOUNTER — Encounter: Payer: Self-pay | Admitting: Family Medicine

## 2020-11-05 ENCOUNTER — Ambulatory Visit (INDEPENDENT_AMBULATORY_CARE_PROVIDER_SITE_OTHER): Payer: Medicare Other | Admitting: Family Medicine

## 2020-11-05 ENCOUNTER — Other Ambulatory Visit: Payer: Self-pay

## 2020-11-05 VITALS — BP 89/62 | HR 93 | Temp 97.8°F | Ht 64.0 in | Wt 151.0 lb

## 2020-11-05 DIAGNOSIS — I2584 Coronary atherosclerosis due to calcified coronary lesion: Secondary | ICD-10-CM

## 2020-11-05 DIAGNOSIS — E538 Deficiency of other specified B group vitamins: Secondary | ICD-10-CM

## 2020-11-05 DIAGNOSIS — E1149 Type 2 diabetes mellitus with other diabetic neurological complication: Secondary | ICD-10-CM | POA: Diagnosis not present

## 2020-11-05 DIAGNOSIS — F321 Major depressive disorder, single episode, moderate: Secondary | ICD-10-CM

## 2020-11-05 DIAGNOSIS — E785 Hyperlipidemia, unspecified: Secondary | ICD-10-CM

## 2020-11-05 DIAGNOSIS — Z23 Encounter for immunization: Secondary | ICD-10-CM

## 2020-11-05 DIAGNOSIS — Z0001 Encounter for general adult medical examination with abnormal findings: Secondary | ICD-10-CM | POA: Diagnosis not present

## 2020-11-05 DIAGNOSIS — Z6836 Body mass index (BMI) 36.0-36.9, adult: Secondary | ICD-10-CM

## 2020-11-05 DIAGNOSIS — E1169 Type 2 diabetes mellitus with other specified complication: Secondary | ICD-10-CM

## 2020-11-05 DIAGNOSIS — E559 Vitamin D deficiency, unspecified: Secondary | ICD-10-CM

## 2020-11-05 DIAGNOSIS — E663 Overweight: Secondary | ICD-10-CM

## 2020-11-05 DIAGNOSIS — I152 Hypertension secondary to endocrine disorders: Secondary | ICD-10-CM

## 2020-11-05 DIAGNOSIS — E1159 Type 2 diabetes mellitus with other circulatory complications: Secondary | ICD-10-CM | POA: Diagnosis not present

## 2020-11-05 DIAGNOSIS — Z87891 Personal history of nicotine dependence: Secondary | ICD-10-CM | POA: Diagnosis not present

## 2020-11-05 DIAGNOSIS — M549 Dorsalgia, unspecified: Secondary | ICD-10-CM | POA: Diagnosis not present

## 2020-11-05 DIAGNOSIS — L989 Disorder of the skin and subcutaneous tissue, unspecified: Secondary | ICD-10-CM | POA: Diagnosis not present

## 2020-11-05 DIAGNOSIS — I2510 Atherosclerotic heart disease of native coronary artery without angina pectoris: Secondary | ICD-10-CM

## 2020-11-05 DIAGNOSIS — F17210 Nicotine dependence, cigarettes, uncomplicated: Secondary | ICD-10-CM | POA: Diagnosis not present

## 2020-11-05 DIAGNOSIS — Z122 Encounter for screening for malignant neoplasm of respiratory organs: Secondary | ICD-10-CM | POA: Diagnosis not present

## 2020-11-05 DIAGNOSIS — I5032 Chronic diastolic (congestive) heart failure: Secondary | ICD-10-CM

## 2020-11-05 DIAGNOSIS — F17200 Nicotine dependence, unspecified, uncomplicated: Secondary | ICD-10-CM

## 2020-11-05 LAB — LIPID PANEL
Cholesterol: 125 mg/dL (ref 0–200)
HDL: 44.4 mg/dL (ref >39.00)
LDL Cholesterol: 44 mg/dL (ref 0–99)
NonHDL: 80.79
Total CHOL/HDL Ratio: 3
Triglycerides: 185 mg/dL — ABNORMAL HIGH (ref 0.0–149.0)
VLDL: 37 mg/dL (ref 0.0–40.0)

## 2020-11-05 LAB — VITAMIN D 25 HYDROXY (VIT D DEFICIENCY, FRACTURES): VITD: 35.28 ng/mL (ref 30.00–100.00)

## 2020-11-05 LAB — VITAMIN B12: Vitamin B-12: 295 pg/mL (ref 211–911)

## 2020-11-05 LAB — CBC
HCT: 42.3 % (ref 36.0–46.0)
Hemoglobin: 13.7 g/dL (ref 12.0–15.0)
MCHC: 32.5 g/dL (ref 30.0–36.0)
MCV: 87.6 fl (ref 78.0–100.0)
Platelets: 303 10*3/uL (ref 150.0–400.0)
RBC: 4.83 Mil/uL (ref 3.87–5.11)
RDW: 13.7 % (ref 11.5–15.5)
WBC: 8.9 10*3/uL (ref 4.0–10.5)

## 2020-11-05 LAB — COMPREHENSIVE METABOLIC PANEL
ALT: 20 U/L (ref 0–35)
AST: 18 U/L (ref 0–37)
Albumin: 3.7 g/dL (ref 3.5–5.2)
Alkaline Phosphatase: 56 U/L (ref 39–117)
BUN: 18 mg/dL (ref 6–23)
CO2: 24 mEq/L (ref 19–32)
Calcium: 9.2 mg/dL (ref 8.4–10.5)
Chloride: 100 mEq/L (ref 96–112)
Creatinine, Ser: 1.12 mg/dL (ref 0.40–1.20)
GFR: 52.8 mL/min — ABNORMAL LOW (ref >60.00)
Glucose, Bld: 228 mg/dL — ABNORMAL HIGH (ref 70–99)
Potassium: 4.9 mEq/L (ref 3.5–5.1)
Sodium: 138 mEq/L (ref 135–145)
Total Bilirubin: 0.4 mg/dL (ref 0.2–1.2)
Total Protein: 6.8 g/dL (ref 6.0–8.3)

## 2020-11-05 LAB — HEMOGLOBIN A1C: Hgb A1c MFr Bld: 10.2 % — ABNORMAL HIGH (ref 4.6–6.5)

## 2020-11-05 LAB — TSH: TSH: 1.93 u[IU]/mL (ref 0.35–5.50)

## 2020-11-05 MED ORDER — CITALOPRAM HYDROBROMIDE 40 MG PO TABS
40.0000 mg | ORAL_TABLET | Freq: Every day | ORAL | 3 refills | Status: DC
Start: 2020-11-05 — End: 2021-11-07

## 2020-11-05 MED ORDER — METFORMIN HCL ER (MOD) 1000 MG PO TB24: 1000.0000 mg | ORAL_TABLET | Freq: Two times a day (BID) | ORAL | 3 refills | Status: DC

## 2020-11-05 NOTE — Assessment & Plan Note (Signed)
Check lipids.  On Lipitor 20 mg daily. 

## 2020-11-05 NOTE — Assessment & Plan Note (Signed)
Patient was asked about her tobacco use today and was strongly advised to quit. Patient is currently Contemplative. We reviewed treatment options to assist her quit smoking including NRT, Chantix, and Bupropion.She will be trying nicotine replacement patches. Follow up at next office visit.   Total time spent counseling approximately 3 minutes.

## 2020-11-05 NOTE — Assessment & Plan Note (Signed)
Check B12 

## 2020-11-05 NOTE — Patient Instructions (Signed)
It was very nice to see you today!  We will check blood work today.  We injected the painful spot on your back.  Hopefully this will help some with the chronic pain you have been experiencing.  We will give you a flu shot today.  I will send in a new prescription for metformin which should have less GI follow-up.  I will see back in about 6 months.  Please come back to see me sooner if needed.  Take care, Dr Jerline Pain  PLEASE NOTE:  If you had any lab tests please let us know if you have not heard back within a few days. You may see your results on mychart before we have a chance to review them but we will give you a call once they are reviewed by [place]. If we ordered any referrals today, please let us know if you have not heard from their office within the next week.   Please try these tips to maintain a healthy lifestyle:  Eat at least 3 REAL meals and 1-2 snacks per day.  Aim for no more than 5 hours between eating.  If you eat breakfast, please do so within one hour of getting up.   Each meal should contain half fruits/vegetables, one quarter protein, and one quarter carbs (no bigger than a computer mouse)  Cut down on sweet beverages. This includes juice, soda, and sweet tea.   Drink at least 1 glass of water with each meal and aim for at least 8 glasses per day  Exercise at least 150 minutes every week.    Preventive Care 37-69 Years Old, Female Preventive care refers to lifestyle choices and visits with your health care provider that can promote health and wellness. This includes: A yearly physical exam. This is also called an annual wellness visit. Regular dental and eye exams. Immunizations. Screening for certain conditions. Healthy lifestyle choices, such as: Eating a healthy diet. Getting regular exercise. Not using drugs or products that contain nicotine and tobacco. Limiting alcohol use. What can I expect for my preventive care visit? Physical exam Your health care  provider will check your: Height and weight. These may be used to calculate your BMI (body mass index). BMI is a measurement that tells if you are at a healthy weight. Heart rate and blood pressure. Body temperature. Skin for abnormal spots. Counseling Your health care provider may ask you questions about your: Past medical problems. Family's medical history. Alcohol, tobacco, and drug use. Emotional well-being. Home life and relationship well-being. Sexual activity. Diet, exercise, and sleep habits. Work and work Statistician. Access to firearms. Method of birth control. Menstrual cycle. Pregnancy history. What immunizations do I need? Vaccines are usually given at various ages, according to a schedule. Your health care provider will recommend vaccines for you based on your age, medical history, and lifestyle or other factors, such as travel or where you work. What tests do I need? Blood tests Lipid and cholesterol levels. These may be checked every 5 years, or more often if you are over 4 years old. Hepatitis C test. Hepatitis B test. Screening Lung cancer screening. You may have this screening every year starting at age 19 if you have a 30-pack-year history of smoking and currently smoke or have quit within the past 15 years. Colorectal cancer screening. All adults should have this screening starting at age 36 and continuing until age 30. Your health care provider may recommend screening at age 22 if you are at increased  risk. You will have tests every 1-10 years, depending on your results and the type of screening test. Diabetes screening. This is done by checking your blood sugar (glucose) after you have not eaten for a while (fasting). You may have this done every 1-3 years. Mammogram. This may be done every 1-2 years. Talk with your health care provider about when you should start having regular mammograms. This may depend on whether you have a family history of breast  cancer. BRCA-related cancer screening. This may be done if you have a family history of breast, ovarian, tubal, or peritoneal cancers. Pelvic exam and Pap test. This may be done every 3 years starting at age 32. Starting at age 54, this may be done every 5 years if you have a Pap test in combination with an HPV test. Other tests STD (sexually transmitted disease) testing, if you are at risk. Bone density scan. This is done to screen for osteoporosis. You may have this scan if you are at high risk for osteoporosis. Talk with your health care provider about your test results, treatment options, and if necessary, the need for more tests. Follow these instructions at home: Eating and drinking  Eat a diet that includes fresh fruits and vegetables, whole grains, lean protein, and low-fat dairy products. Take vitamin and mineral supplements as recommended by your health care provider. Do not drink alcohol if: Your health care provider tells you not to drink. You are pregnant, may be pregnant, or are planning to become pregnant. If you drink alcohol: Limit how much you have to 0-1 drink a day. Be aware of how much alcohol is in your drink. In the [place] one drink equals one 12 oz bottle of beer (355 mL), one 5 oz glass of wine (148 mL), or one 1 oz glass of hard liquor (44 mL). Lifestyle Take daily care of your teeth and gums. Brush your teeth every morning and night with fluoride toothpaste. Floss one time each day. Stay active. Exercise for at least 30 minutes 5 or more days each week. Do not use any products that contain nicotine or tobacco, such as cigarettes, e-cigarettes, and chewing tobacco. If you need help quitting, ask your health care provider. Do not use drugs. If you are sexually active, practice safe sex. Use a condom or other form of protection to prevent STIs (sexually transmitted infections). If you do not wish to become pregnant, use a form of birth control. If you plan to become  pregnant, see your health care provider for a prepregnancy visit. If told by your health care provider, take low-dose aspirin daily starting at age 52. Find healthy ways to cope with stress, such as: Meditation, yoga, or listening to music. Journaling. Talking to a trusted person. Spending time with friends and family. Safety Always wear your seat belt while driving or riding in a vehicle. Do not drive: If you have been drinking alcohol. Do not ride with someone who has been drinking. When you are tired or distracted. While texting. Wear a helmet and other protective equipment during sports activities. If you have firearms in your house, make sure you follow all gun safety procedures. What's next? Visit your health care provider once a year for an annual wellness visit. Ask your health care provider how often you should have your eyes and teeth checked. Stay up to date on all vaccines. This information is not intended to replace advice given to you by your health care provider. Make sure you discuss  any questions you have with your health care provider. Document Revised: 04/23/2020 Document Reviewed: 10/25/2017 Elsevier Patient Education  2022 [employer].

## 2020-11-05 NOTE — Assessment & Plan Note (Signed)
Check Vit D 

## 2020-11-05 NOTE — Assessment & Plan Note (Signed)
Globin today.  To be contributing somewhat to her fatigue.  We will continue current medications per cardiology though asked her to follow-up with them soon.

## 2020-11-05 NOTE — Assessment & Plan Note (Signed)
Follows with cardiology.  On aspirin and statin.

## 2020-11-05 NOTE — Assessment & Plan Note (Signed)
Continue management per cardiology.  Check labs today.  Euvolemic today.

## 2020-11-05 NOTE — Assessment & Plan Note (Signed)
Check A1c.  We will switch metformin to extended release.  Hopefully this will help some GI side effects.  Continue Jardiance 25 mg daily.

## 2020-11-05 NOTE — Assessment & Plan Note (Signed)
Under more stress recently the Celexa is working well.  Continue 40 mg daily.  She does not wish to add on Wellbutrin at this point.
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
## 2020-11-08 ENCOUNTER — Telehealth: Payer: Self-pay | Admitting: *Deleted

## 2020-11-08 NOTE — Telephone Encounter (Signed)
[place] [phone] requesting Rx Metformin (Glumetza) to be change to Metformin ER '500mg'$  due to insurance preference  Please advise

## 2020-11-08 NOTE — Telephone Encounter (Signed)
Ok with me. Please place any necessary orders. 

## 2020-11-09 MED ORDER — METFORMIN HCL ER 500 MG PO TB24: 500.0000 mg | ORAL_TABLET | Freq: Two times a day (BID) | ORAL | 3 refills | Status: DC

## 2020-11-09 NOTE — Progress Notes (Signed)
Please inform patient of the following:  Her A1c is well above goal. She is already on max dose for metformin and jardiance. Recommend starting ozempic 0.'25mg'$  weekly for 4 weeks and then increasing to 0.'5mg'$  weekly.  She should follow up in 3 months to recheck A1c.  Her B12 is also low.  Recommend starting B12 protocol.  Everything else is stable.  Both of these could explain some issues with fatigue that she has been having.  We can readdress next time she comes in for office visit.

## 2020-11-09 NOTE — Telephone Encounter (Signed)
Rx sent to pharmacy   

## 2020-11-11 ENCOUNTER — Telehealth: Payer: Self-pay

## 2020-11-11 NOTE — Telephone Encounter (Signed)
Pt would like a call back about labs and a CAT scan. Please Advise.

## 2020-11-15 ENCOUNTER — Encounter: Payer: Self-pay | Admitting: Family Medicine

## 2020-11-15 NOTE — Telephone Encounter (Signed)
See results note. 

## 2020-11-16 ENCOUNTER — Other Ambulatory Visit (HOSPITAL_COMMUNITY): Payer: Self-pay

## 2020-11-16 MED ORDER — ENTRESTO 97-103 MG PO TABS
1.0000 | ORAL_TABLET | Freq: Two times a day (BID) | ORAL | 0 refills | Status: DC
Start: 2020-11-16 — End: 2021-02-10

## 2020-11-16 NOTE — Telephone Encounter (Signed)
See lab results note.

## 2020-11-17 ENCOUNTER — Other Ambulatory Visit: Payer: Self-pay | Admitting: *Deleted

## 2020-11-17 MED ORDER — CYANOCOBALAMIN 1000 MCG/ML IJ SOLN: INTRAMUSCULAR | 7 refills | Status: DC

## 2020-11-18 ENCOUNTER — Encounter: Payer: Self-pay | Admitting: Family Medicine

## 2020-11-22 ENCOUNTER — Telehealth (INDEPENDENT_AMBULATORY_CARE_PROVIDER_SITE_OTHER): Payer: Medicare Other | Admitting: Family Medicine

## 2020-11-22 ENCOUNTER — Telehealth: Payer: Self-pay | Admitting: *Deleted

## 2020-11-22 ENCOUNTER — Other Ambulatory Visit: Payer: Self-pay | Admitting: *Deleted

## 2020-11-22 VITALS — Ht 64.0 in | Wt 148.0 lb

## 2020-11-22 DIAGNOSIS — E1149 Type 2 diabetes mellitus with other diabetic neurological complication: Secondary | ICD-10-CM

## 2020-11-22 DIAGNOSIS — G4700 Insomnia, unspecified: Secondary | ICD-10-CM | POA: Diagnosis not present

## 2020-11-22 MED ORDER — TRESIBA FLEXTOUCH 100 UNIT/ML ~~LOC~~ SOPN: 10.0000 [IU] | PEN_INJECTOR | Freq: Every day | SUBCUTANEOUS | 0 refills | Status: DC

## 2020-11-22 MED ORDER — METFORMIN HCL ER 500 MG PO TB24: 1000.0000 mg | ORAL_TABLET | Freq: Two times a day (BID) | ORAL | 3 refills | Status: DC

## 2020-11-22 NOTE — Assessment & Plan Note (Signed)
Last A1c not controlled at 10.2.  We discussed starting GLP-1 agonist however she would like to hold off on for now due to her history of recurrent pancreatitis.  We will continue Jardiance 25 mg daily and metformin 1000 mg twice daily.  We will start Tresiba 10 units daily.  Advised her to check her fasting blood sugar over the next 1 to 2 weeks and then send [place] a MyChart message.  We can adjust the dose of insulin as needed.

## 2020-11-22 NOTE — Telephone Encounter (Signed)
Rx Emily [name] Lextouch faxed to [facility] prescription assistant program [phone]

## 2020-11-22 NOTE — Assessment & Plan Note (Signed)
On lorazepam 2 mg nightly.  We will continue this.  Advised her that she can take an extra dose during the day if needed for anxiety.

## 2020-11-22 NOTE — Progress Notes (Signed)
   Emily [name] is a 60 y.o. female who presents today for a virtual office visit.  Assessment/Plan:  Chronic Problems Addressed Today: Insomnia On lorazepam 2 mg nightly.  We will continue this.  Advised her that she can take an extra dose during the day if needed for anxiety.  Type 2 diabetes mellitus with neurological complications (HCC) Last A1c not controlled at 10.2.  We discussed starting GLP-1 agonist however she would like to hold off on for now due to her history of recurrent pancreatitis.  We will continue Jardiance 25 mg daily and metformin 1000 mg twice daily.  We will start Tresiba 10 units daily.  Advised her to check her fasting blood sugar over the next 1 to 2 weeks and then send [place] a MyChart message.  We can adjust the dose of insulin as needed.     Subjective:  HPI:  See A/p.         Objective/Observations  Physical Exam: Gen: NAD, resting comfortably Pulm: Normal work of breathing Neuro: Grossly normal, moves all extremities Psych: Normal affect and thought content  Virtual Visit via Video   I connected with Emily [name] on 11/22/20 at  8:00 AM EDT by a video enabled telemedicine application and verified that I am speaking with the correct person using two identifiers. The limitations of evaluation and management by telemedicine and the availability of in person appointments were discussed. The patient expressed understanding and agreed to proceed.   Patient location: Home Provider location: [place] participating in the virtual visit: Myself and Patient     [name]. Jerline Pain, MD 11/22/2020 8:19 AM

## 2020-11-24 ENCOUNTER — Ambulatory Visit (HOSPITAL_COMMUNITY): Payer: Medicare Other

## 2020-12-01 ENCOUNTER — Encounter: Payer: Self-pay | Admitting: Family Medicine

## 2020-12-02 ENCOUNTER — Other Ambulatory Visit: Payer: Self-pay

## 2020-12-02 ENCOUNTER — Other Ambulatory Visit: Payer: Self-pay | Admitting: Family Medicine

## 2020-12-02 MED ORDER — ONETOUCH ULTRA MINI W/DEVICE KIT: PACK | 3 refills | Status: DC

## 2020-12-02 MED ORDER — TRESIBA FLEXTOUCH 100 UNIT/ML ~~LOC~~ SOPN: 10.0000 [IU] | PEN_INJECTOR | Freq: Every day | SUBCUTANEOUS | 0 refills | Status: DC

## 2020-12-06 ENCOUNTER — Other Ambulatory Visit (HOSPITAL_COMMUNITY): Payer: Self-pay | Admitting: Internal Medicine

## 2020-12-06 DIAGNOSIS — E785 Hyperlipidemia, unspecified: Secondary | ICD-10-CM

## 2020-12-07 ENCOUNTER — Other Ambulatory Visit: Payer: Self-pay

## 2020-12-07 ENCOUNTER — Other Ambulatory Visit: Payer: Self-pay | Admitting: *Deleted

## 2020-12-07 ENCOUNTER — Ambulatory Visit (HOSPITAL_COMMUNITY)
Admission: RE | Admit: 2020-12-07 | Discharge: 2020-12-07 | Disposition: A | Discharge status: Home / Self Care | Payer: Medicare Other | Source: Ambulatory Visit | Attending: Family Medicine | Admitting: Family Medicine

## 2020-12-07 DIAGNOSIS — R0602 Shortness of breath: Secondary | ICD-10-CM | POA: Diagnosis not present

## 2020-12-07 DIAGNOSIS — R079 Chest pain, unspecified: Secondary | ICD-10-CM | POA: Diagnosis not present

## 2020-12-07 DIAGNOSIS — Z122 Encounter for screening for malignant neoplasm of respiratory organs: Secondary | ICD-10-CM

## 2020-12-07 DIAGNOSIS — R911 Solitary pulmonary nodule: Secondary | ICD-10-CM | POA: Diagnosis not present

## 2020-12-07 DIAGNOSIS — I700 Atherosclerosis of aorta: Secondary | ICD-10-CM | POA: Diagnosis not present

## 2020-12-09 ENCOUNTER — Encounter: Payer: Self-pay | Admitting: Family Medicine

## 2020-12-09 ENCOUNTER — Other Ambulatory Visit: Payer: Self-pay | Admitting: *Deleted

## 2020-12-09 ENCOUNTER — Other Ambulatory Visit: Payer: Self-pay | Admitting: Family Medicine

## 2020-12-09 MED ORDER — LORAZEPAM 2 MG PO TABS: ORAL_TABLET | ORAL | 0 refills | Status: DC

## 2020-12-10 ENCOUNTER — Telehealth: Payer: Self-pay

## 2020-12-10 NOTE — Progress Notes (Signed)
Please inform patient of the following:  Her CT scan showed chronic changes associated with smoking in her lungs and in her arteries but no signs of lung cancer.  She has a compression fracture of one of the vertebra in her back. I think this is the source of her back pain. Since her symptoms have been going on for the last few months I recommend referral to orthopedics for further management.

## 2020-12-10 NOTE — Telephone Encounter (Signed)
Called and lm for pt tcb. 
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
## 2020-12-31 ENCOUNTER — Encounter: Payer: Self-pay | Admitting: Family Medicine

## 2020-12-31 NOTE — Telephone Encounter (Signed)
See note

## 2021-01-05 ENCOUNTER — Encounter: Payer: Self-pay | Admitting: Family Medicine

## 2021-01-07 ENCOUNTER — Telehealth: Payer: Self-pay

## 2021-01-07 ENCOUNTER — Encounter: Payer: Self-pay | Admitting: Family Medicine

## 2021-01-07 NOTE — Telephone Encounter (Signed)
Patient is calling in stating that she believes she has a yeast infection, advised that we need to schedule an appointment but Avrie says she doesn't live close by and has no one to watch her mother. Wondering if we can send a prescription.

## 2021-01-10 ENCOUNTER — Other Ambulatory Visit: Payer: Self-pay | Admitting: *Deleted

## 2021-01-10 MED ORDER — FLUCONAZOLE 150 MG PO TABS: 150.0000 mg | ORAL_TABLET | Freq: Once | ORAL | 0 refills | Status: AC

## 2021-01-10 NOTE — Telephone Encounter (Signed)
Rx send

## 2021-01-10 NOTE — Telephone Encounter (Signed)
Rx send to [facility]

## 2021-01-11 ENCOUNTER — Other Ambulatory Visit: Payer: Self-pay

## 2021-01-11 ENCOUNTER — Ambulatory Visit: Payer: Medicare Other | Admitting: Orthopaedic Surgery

## 2021-01-11 ENCOUNTER — Encounter: Payer: Self-pay | Admitting: Orthopaedic Surgery

## 2021-01-11 ENCOUNTER — Ambulatory Visit: Payer: Self-pay

## 2021-01-11 VITALS — BP 116/76 | Ht 65.0 in | Wt 153.0 lb

## 2021-01-11 DIAGNOSIS — G8929 Other chronic pain: Secondary | ICD-10-CM | POA: Diagnosis not present

## 2021-01-11 DIAGNOSIS — S22050A Wedge compression fracture of T5-T6 vertebra, initial encounter for closed fracture: Secondary | ICD-10-CM

## 2021-01-11 DIAGNOSIS — M545 Low back pain, unspecified: Secondary | ICD-10-CM | POA: Diagnosis not present

## 2021-01-11 DIAGNOSIS — M549 Dorsalgia, unspecified: Secondary | ICD-10-CM | POA: Diagnosis not present
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
## 2021-01-12 DIAGNOSIS — S22000A Wedge compression fracture of unspecified thoracic vertebra, initial encounter for closed fracture: Secondary | ICD-10-CM | POA: Insufficient documentation

## 2021-01-19 ENCOUNTER — Other Ambulatory Visit: Payer: Self-pay | Admitting: Family Medicine

## 2021-01-19 DIAGNOSIS — Z1231 Encounter for screening mammogram for malignant neoplasm of breast: Secondary | ICD-10-CM

## 2021-01-30 ENCOUNTER — Other Ambulatory Visit: Payer: Self-pay | Admitting: Family Medicine

## 2021-01-30 ENCOUNTER — Other Ambulatory Visit (HOSPITAL_COMMUNITY): Payer: Self-pay | Admitting: Internal Medicine

## 2021-01-30 DIAGNOSIS — E785 Hyperlipidemia, unspecified: Secondary | ICD-10-CM

## 2021-01-31 ENCOUNTER — Telehealth (HOSPITAL_COMMUNITY): Payer: Self-pay

## 2021-01-31 ENCOUNTER — Encounter (HOSPITAL_COMMUNITY): Payer: Medicare Other | Admitting: Internal Medicine

## 2021-01-31 NOTE — Telephone Encounter (Signed)
Patient called and request patient assistance on entresto and jardiance.

## 2021-02-02 ENCOUNTER — Telehealth (HOSPITAL_COMMUNITY): Payer: Self-pay | Admitting: Pharmacy Technician

## 2021-02-02 NOTE — Telephone Encounter (Signed)
Advanced Heart Failure Patient Advocate Encounter  Patient requested Emily [name] and Jardiance assistance help. Called and left vm regarding renewal. Advised the patient to call back so we can complete that.   Charlann Boxer, CPhT

## 2021-02-10 ENCOUNTER — Other Ambulatory Visit (HOSPITAL_COMMUNITY): Payer: Self-pay

## 2021-02-10 MED ORDER — ENTRESTO 97-103 MG PO TABS: 1.0000 | ORAL_TABLET | Freq: Two times a day (BID) | ORAL | 0 refills | Status: DC

## 2021-02-24 ENCOUNTER — Ambulatory Visit: Payer: Medicare Other

## 2021-02-27 ENCOUNTER — Other Ambulatory Visit: Payer: Self-pay | Admitting: Family Medicine

## 2021-03-01 NOTE — Telephone Encounter (Signed)
Last Ov- 11/22/2020 Last refill: 12/03/2020 Disp: 90 tabs Refill:0

## 2021-03-04 ENCOUNTER — Other Ambulatory Visit: Payer: Self-pay | Admitting: Family Medicine

## 2021-03-07 MED ORDER — LORAZEPAM 2 MG PO TABS: ORAL_TABLET | ORAL | 0 refills | Status: DC

## 2021-03-07 NOTE — Telephone Encounter (Signed)
Advanced Heart Failure Patient Advocate Encounter  Patient called back about assistance renewal while I was out of the office on vacation. Called patient back, had to leave another message.  Charlann Boxer, CPhT

## 2021-03-18 ENCOUNTER — Ambulatory Visit: Payer: Medicare Other

## 2021-03-30 ENCOUNTER — Encounter (HOSPITAL_COMMUNITY): Payer: Medicare Other | Admitting: Internal Medicine

## 2021-03-31 ENCOUNTER — Other Ambulatory Visit (HOSPITAL_COMMUNITY): Payer: Self-pay

## 2021-03-31 ENCOUNTER — Other Ambulatory Visit (HOSPITAL_COMMUNITY): Payer: Self-pay | Admitting: *Deleted

## 2021-03-31 MED ORDER — EMPAGLIFLOZIN 25 MG PO TABS: 25.0000 mg | ORAL_TABLET | Freq: Every day | ORAL | 3 refills | Status: DC

## 2021-03-31 MED ORDER — ENTRESTO 97-103 MG PO TABS: 1.0000 | ORAL_TABLET | Freq: Two times a day (BID) | ORAL | 3 refills | Status: DC

## 2021-03-31 NOTE — Telephone Encounter (Signed)
Advanced Heart Failure Patient Advocate Encounter  Patient was approved for a PAN HF grant that will help cover the cost of Jardiance and Entresto. Insurance covers 100 day supply for the same co-pay as a 90 day supply. Will not seek assistance at this time. (100 day supply co-pay, $141 each)  Member ID: [id number] Group ID: [id number] RxBin ID: [id number] PCN: [id number] Eligibility Start Date: 03/31/2021 Eligibility End Date: 03/30/2022 Assistance Amount: $1,200.00  Sent 100 day RX request to [facility] ([place]) to send to [employer].  Charlann Boxer, CPhT

## 2021-05-26 ENCOUNTER — Ambulatory Visit (HOSPITAL_COMMUNITY)
Admission: RE | Admit: 2021-05-26 | Discharge: 2021-05-26 | Disposition: A | Discharge status: Home / Self Care | Payer: Medicare Other | Source: Ambulatory Visit | Attending: Internal Medicine | Admitting: Internal Medicine

## 2021-05-26 ENCOUNTER — Encounter (HOSPITAL_COMMUNITY): Payer: Self-pay | Admitting: Internal Medicine

## 2021-05-26 VITALS — BP 98/58 | HR 92 | Wt 157.8 lb

## 2021-05-26 DIAGNOSIS — Z7984 Long term (current) use of oral hypoglycemic drugs: Secondary | ICD-10-CM | POA: Insufficient documentation

## 2021-05-26 DIAGNOSIS — Z720 Tobacco use: Secondary | ICD-10-CM

## 2021-05-26 DIAGNOSIS — Z7982 Long term (current) use of aspirin: Secondary | ICD-10-CM | POA: Insufficient documentation

## 2021-05-26 DIAGNOSIS — F32A Depression, unspecified: Secondary | ICD-10-CM | POA: Insufficient documentation

## 2021-05-26 DIAGNOSIS — J449 Chronic obstructive pulmonary disease, unspecified: Secondary | ICD-10-CM | POA: Insufficient documentation

## 2021-05-26 DIAGNOSIS — E119 Type 2 diabetes mellitus without complications: Secondary | ICD-10-CM | POA: Diagnosis not present

## 2021-05-26 DIAGNOSIS — R9431 Abnormal electrocardiogram [ECG] [EKG]: Secondary | ICD-10-CM | POA: Insufficient documentation

## 2021-05-26 DIAGNOSIS — Z79899 Other long term (current) drug therapy: Secondary | ICD-10-CM | POA: Insufficient documentation

## 2021-05-26 DIAGNOSIS — Z8589 Personal history of malignant neoplasm of other organs and systems: Secondary | ICD-10-CM | POA: Diagnosis not present

## 2021-05-26 DIAGNOSIS — S22050D Wedge compression fracture of T5-T6 vertebra, subsequent encounter for fracture with routine healing: Secondary | ICD-10-CM | POA: Diagnosis not present

## 2021-05-26 DIAGNOSIS — I5022 Chronic systolic (congestive) heart failure: Secondary | ICD-10-CM | POA: Insufficient documentation

## 2021-05-26 DIAGNOSIS — Z9049 Acquired absence of other specified parts of digestive tract: Secondary | ICD-10-CM | POA: Insufficient documentation

## 2021-05-26 DIAGNOSIS — I428 Other cardiomyopathies: Secondary | ICD-10-CM | POA: Diagnosis not present

## 2021-05-26 DIAGNOSIS — F17210 Nicotine dependence, cigarettes, uncomplicated: Secondary | ICD-10-CM | POA: Diagnosis not present

## 2021-05-26 DIAGNOSIS — Z634 Disappearance and death of family member: Secondary | ICD-10-CM | POA: Diagnosis not present

## 2021-05-26 DIAGNOSIS — R570 Cardiogenic shock: Secondary | ICD-10-CM | POA: Diagnosis not present

## 2021-05-26 DIAGNOSIS — I2510 Atherosclerotic heart disease of native coronary artery without angina pectoris: Secondary | ICD-10-CM

## 2021-05-26 DIAGNOSIS — F1491 Cocaine use, unspecified, in remission: Secondary | ICD-10-CM | POA: Diagnosis not present

## 2021-05-26 DIAGNOSIS — I6523 Occlusion and stenosis of bilateral carotid arteries: Secondary | ICD-10-CM | POA: Diagnosis not present

## 2021-05-26 DIAGNOSIS — I493 Ventricular premature depolarization: Secondary | ICD-10-CM | POA: Insufficient documentation

## 2021-05-26 DIAGNOSIS — Z853 Personal history of malignant neoplasm of breast: Secondary | ICD-10-CM | POA: Diagnosis not present

## 2021-05-26 DIAGNOSIS — Z87442 Personal history of urinary calculi: Secondary | ICD-10-CM | POA: Insufficient documentation

## 2021-05-26 DIAGNOSIS — E875 Hyperkalemia: Secondary | ICD-10-CM | POA: Insufficient documentation

## 2021-05-26 DIAGNOSIS — X58XXXD Exposure to other specified factors, subsequent encounter: Secondary | ICD-10-CM | POA: Insufficient documentation

## 2021-05-26 DIAGNOSIS — Z9221 Personal history of antineoplastic chemotherapy: Secondary | ICD-10-CM | POA: Insufficient documentation

## 2021-05-26 MED ORDER — ENTRESTO 49-51 MG PO TABS: 1.0000 | ORAL_TABLET | Freq: Two times a day (BID) | ORAL | 11 refills | Status: DC

## 2021-05-26 NOTE — Progress Notes (Signed)
Patient ID: Emily [name], female   DOB: 1960-05-28, 61 y.o.   MRN: [id number] ? ? ? ? ? ?Patient did not show for appt. Note left for templating purposes only.  ? ? ? ?ADVANCED HF CLINIC NOTE ? ?PCP: Betha Loa, MD ?Primary Cardiologist: Dr. Haroldine Laws ?Oncologist: Dr Tami Lin  ? ?[name] is a 61 yo woman with a history of ER + R breast cancer s/p R lumpectomy, DM2, cholelithiasis s/p cholecystectomy (02/2013), COPD and systolic CHF due to nonischemic cardiomyopathy.  ? ? Patient was admitted in 8/14 with acute pulmonary edema and cardiogenic shock.  LHC showed moderate nonobstructive CAD (40-50% mLAD, 50-70% LCx, 50% PDA).  She was started on milrinone and diuresed.  Cause of cardiomyopathy suspected to be Adriamycin toxicity.   ? ?12/05/12 Carotid Dopplers- R 39 % stenosis and L  40-59% stenosis ? ? ?Had surgery 4/17 in [place] to remove R chest leiomyosarcoma. Margins not clear so underwent repeat resection here with Dr. Barry Dienes.   ? ?Admitted 5/17 with recurrent HF. Found to have EF 10-15% with NYHA IIIB-IV symptoms. UDS + cocaine. Cath showed non-obstructive CAD with Cardiogenic shock initial PA sat 34%. Started on milrinone. Diuresed about 5 pounds. Milrinone weaned off and co-ox dropped down to 52% but was feeling ok.  ? ?Echo 12/22/19: EF 50-55%  ? ?Echo 7/22 EF 55-60%  ? ?Today she returns for HF follow up. Has been depressed over the past few months over the death of her mother. BP has been running low and gets dizzy at times. Has rcent T5 compression fracture. Still smoking 1ppd ? ? ?Studies: ? ?PFTs 1/19 ?FEV1 1.23 (45%) ?FVC 1.69 (48%) ?DLCO 61% ? ?Echo 10/27/12 EF 20-25% ?Echo 01/09/13 EF 30-35% ?Echo 06/05/13 EF 45-50% septum dysynnergic ?Echo 5/17: EF 15-20% ?Echo 12/01/15 LVEF 55-60% ?Echo 02/2017 EF 40-45%. ?Echo 02/2018 EF 55-60%  ? ?Cath 07/14/15 ?Ao = 106/72 (87) ?LV =  108/28 (36) ?RA =  22 ?RV = 60/14/25 ?RA = 67/22 (51) ?PCW = 31 ?Fick cardiac output/index = 2.8/1.6 ?SVR = 1823 ?PVR = 7.0 WU ?FA  sat = 90% ?PA sat = 34%, 42% ? ?Assessment: ?1. Mild non-obstructive CAD ?2. Severe NICM with EF 25% by echo ?3. Cardiogenic shock physiology with markedly elevated biventricular filling pressures ?Lexiscan 09/23/13: EF 50% normal perfusion ?Holter: SR occasional PVCs ? ? ?Review of systems complete and found to be negative unless listed in HPI.   ? ?Current Outpatient Medications  ?Medication Sig Dispense Refill  ? albuterol (VENTOLIN HFA) 108 (90 Base) MCG/ACT inhaler Inhale 2 puffs into the lungs every 6 (six) hours as needed for wheezing or shortness of breath. 1 each 0  ? aspirin EC 81 MG tablet Take 1 tablet (81 mg total) by mouth daily. 90 tablet 3  ? atorvastatin (LIPITOR) 20 MG tablet TAKE 1 TABLET BY MOUTH ONCE DAILY KEEP  APPOINTMENT  FOR  FURTHER  REFILLS 60 tablet 2  ? Blood Glucose Monitoring Suppl (ONE TOUCH ULTRA MINI) w/Device KIT Use to test blood sugars daily. Dx: E11.9 1 kit 3  ? carvedilol (COREG) 6.25 MG tablet TAKE 1 TABLET BY MOUTH TWICE DAILY WITH A MEAL 180 tablet 3  ? Cholecalciferol 125 MCG (5000 UT) TABS Vitamin D3 125 mcg (5,000 unit) tablet ? Take by oral route.    ? citalopram (CELEXA) 40 MG tablet Take 1 tablet (40 mg total) by mouth daily. 90 tablet 3  ? Cyanocobalamin (VITAMIN B 12 PO) Take by mouth daily.    ?  empagliflozin (JARDIANCE) 25 MG TABS tablet Take 1 tablet (25 mg total) by mouth daily before breakfast. 100 tablet 3  ? hydrOXYzine (ATARAX) 50 MG tablet TAKE 1 TABLET BY MOUTH THREE TIMES DAILY AS NEEDED FOR ITCHING 90 tablet 0  ? Insulin Degludec (TRESIBA [place]) Inject 20 Units into the skin daily.    ? LORazepam (ATIVAN) 2 MG tablet TAKE 1 TABLET BY MOUTH AT BEDTIME AS NEEDED FOR ANXIETY 90 tablet 0  ? metFORMIN (GLUCOPHAGE XR) 500 MG 24 hr tablet Take 2 tablets (1,000 mg total) by mouth 2 (two) times daily. 180 tablet 3  ? ondansetron (ZOFRAN ODT) 4 MG disintegrating tablet Take 1 tablet (4 mg total) by mouth every 8 (eight) hours as needed for nausea or vomiting. 10 tablet  0  ? ONETOUCH ULTRA test strip USE TO CHECK BLOOD SUGAR THREE TIMES DAILY 100 each 0  ? sacubitril-valsartan (ENTRESTO) 97-103 MG Take 1 tablet by mouth 2 (two) times daily. 200 tablet 3  ? ?No current facility-administered medications for this encounter.  ? ? ?Vitals:  ? 05/26/21 1117  ?BP: (!) 98/58  ?Pulse: 92  ?SpO2: 94%  ?Weight: 71.6 kg (157 lb 12.8 oz)  ? ?Wt Readings from Last 3 Encounters:  ?05/26/21 71.6 kg (157 lb 12.8 oz)  ?01/11/21 69.4 kg (153 lb)  ?11/22/20 67.1 kg (148 lb)  ?  ?General:  Well appearing. No resp difficulty ?HEENT: normal ?Neck: supple. no JVD. Carotids 2+ bilat; no bruits. No lymphadenopathy or thryomegaly appreciated. ?Cor: PMI nondisplaced. Regular rate & rhythm. No rubs, gallops or murmurs. ?Lungs: decreased throughout ?Abdomen: soft, nontender, nondistended. No hepatosplenomegaly. No bruits or masses. Good bowel sounds. ?Extremities: no cyanosis, clubbing, rash, edema ?Neuro: alert & orientedx3, cranial nerves grossly intact. moves all 4 extremities w/o difficulty. Affect pleasant ? ? ?Assessment/Plan: ? ?1. Chronic systolic CHF: NICM, likely Adriamycin toxicity, ?- Echo 8/14 EF 20-25% ?- Echo 5/17 EF 15% ?- Echo 02/2017 EF 40-45%. ?- Echo 02/2018 EF 55-60% ?- Echo 12/22/19 EF 50-55%  ?- Echo 7/22 EF 55-60% ?- Stable NYHA II but likely more due to COPD than HF ?- Volume status ok.  ?- Decrease Entresto 97/103 -> 49/51 due to low BP . ? -Continue spiro 12.5 mg daily. Unable to tolerate 25 daily due to hyperK ?- Continue carvedilol to 6.25 BID  ?- Continue jardiance 25 daily   ?- Repeat echo in 6 months ? 2. CAD: Moderate nonobstructive disease. ?- No s/s ischemia ?- Continue ASA and statin ?3. COPD/tobacco use ?-Smoking 1ppd. Encouraged cessation ?4. Cocaine use ?- Remains abstinent ?5. Breast Cancer: ?- Previously received adriamycin. Cancer free for > 5 years. No change. ?6. Carotid stenosis:  ?- Asymptomatic  ?- Mild 1-39% 2/20  ?- On statin.  ?7. R chest leiomyosarcoma: ?- Had  further resections and margins now clear.  ?- Sees Dr. Alen Blew and Dr. Barry Dienes. ?- No change ?8. DM2 ?- On Jardiance ?9. Compression Fx ?- would she be candidate for kyphoplasty with IR ? ?Glori Bickers, MD  ?11:48 AM  ? ? ? ?  ?

## 2021-05-26 NOTE — Patient Instructions (Signed)
Decreased Entresto to 49/51 mg Twice daily  ? ?Your physician recommends that you schedule a follow-up appointment in: 9 months with an echocardiogram, **PLEASE CALL OUR OFFICE IN December TO SCHEDULE THIS APPOINTMENT ? ?If you have any questions or concerns before your next appointment please send [place] a message through [facility] or call our office at [phone].   ? ?TO LEAVE A MESSAGE FOR THE NURSE SELECT OPTION 2, PLEASE LEAVE A MESSAGE INCLUDING: ?YOUR NAME ?DATE OF BIRTH ?CALL BACK NUMBER ?REASON FOR CALL**this is important as we prioritize the call backs ? ?YOU WILL RECEIVE A CALL BACK THE SAME DAY AS LONG AS YOU CALL BEFORE 4:00 PM ? ?At the [facility], you and your health needs are our priority. As part of our continuing mission to provide you with exceptional heart care, we have created designated Provider Care Teams. These Care Teams include your primary Cardiologist (physician) and Advanced Practice Providers (APPs- Physician Assistants and Nurse Practitioners) who all work together to provide you with the care you need, when you need it.  ? ?You may see any of the following providers on your designated Care Team at your next follow up: ?Dr Glori Bickers ?Dr Loralie Champagne ?Darrick Grinder, NP ?Lyda Jester, PA ?Jessica Milford,NP ?Marlyce Huge, PA ?Audry Riles, PharmD ? ? ?Please be sure to bring in all your medications bottles to every appointment.  ? ? ?

## 2021-05-26 NOTE — Addendum Note (Signed)
Encounter addended by: Scarlette Calico, RN on: 05/26/2021 12:00 PM ? Actions taken: Order list changed, Diagnosis association updated, Clinical Note Signed

## 2021-06-03 ENCOUNTER — Other Ambulatory Visit: Payer: Self-pay | Admitting: Family Medicine

## 2021-06-06 ENCOUNTER — Telehealth (INDEPENDENT_AMBULATORY_CARE_PROVIDER_SITE_OTHER): Payer: Medicare Other | Admitting: Family Medicine

## 2021-06-06 ENCOUNTER — Other Ambulatory Visit (HOSPITAL_COMMUNITY): Payer: Self-pay | Admitting: Internal Medicine

## 2021-06-06 ENCOUNTER — Encounter: Payer: Self-pay | Admitting: Family Medicine

## 2021-06-06 VITALS — Temp 100.4°F | Ht 65.0 in | Wt 157.0 lb

## 2021-06-06 DIAGNOSIS — J449 Chronic obstructive pulmonary disease, unspecified: Secondary | ICD-10-CM

## 2021-06-06 DIAGNOSIS — S22050A Wedge compression fracture of T5-T6 vertebra, initial encounter for closed fracture: Secondary | ICD-10-CM

## 2021-06-06 DIAGNOSIS — F321 Major depressive disorder, single episode, moderate: Secondary | ICD-10-CM | POA: Diagnosis not present

## 2021-06-06 MED ORDER — AZITHROMYCIN 250 MG PO TABS: ORAL_TABLET | ORAL | 0 refills | Status: DC

## 2021-06-06 MED ORDER — ARIPIPRAZOLE 5 MG PO TABS: 5.0000 mg | ORAL_TABLET | Freq: Every day | ORAL | 3 refills | Status: DC

## 2021-06-06 MED ORDER — PREDNISONE 50 MG PO TABS: ORAL_TABLET | ORAL | 0 refills | Status: DC

## 2021-06-06 NOTE — Assessment & Plan Note (Addendum)
Discussed limitations of virtual visit and inability to perform physical exam.  Current symptoms seem to be consistent with acute flare.  She is not currently in any respiratory distress.  We will empirically treat with course of azithromycin and prednisone.  She will continue her albuterol inhaler.  She will let me know if not improving.  We discussed reasons to return to care. ?

## 2021-06-06 NOTE — Progress Notes (Signed)
? ?  Emily [name] is a 61 y.o. female who presents today for a virtual office visit. ? ?Assessment/Plan:  ?Chronic Problems Addressed Today: ?COPD (chronic obstructive pulmonary disease) ([place]) ?Discussed limitations of virtual visit and inability to perform physical exam.  Current symptoms seem to be consistent with acute flare.  She is not currently in any respiratory distress.  We will empirically treat with course of azithromycin and prednisone.  She will continue her albuterol inhaler.  She will let me know if not improving.  We discussed reasons to return to care. ? ?Moderate single current episode of major depressive disorder ([place]) ?Her mother passed away about 6 months ago.  This has been a large source of stress for her.  No reported SI or HI.  She is not interested in seeing a counselor or therapist at this point.  She is on Celexa 40 mg daily.  She did not feel like Wellbutrin was effective in the past.  We will add on Abilify 5 mg daily.  She will follow-up with me in a couple of weeks via MyChart. ? ?Thoracic compression fracture ([place]) ?Still has a significant amount of pain.  We discussed referral to sports medicine however she deferred.  She will let me know if she would like to pursue this in the future. ? ? ?  ?Subjective:  ?HPI: ? ?Patient here with fever and congestion.  Started few days ago.  Worsening the last day or so.  Some shortness of breath.  Cough has been productive.  No sick contacts.  No reported chest pain.  She has had some back pain related to a compression fracture she has been dealing with for over a year.  No nausea or vomiting.  No symptoms at rest.  She has been using her albuterol inhaler with some improvement. ? ?   ?  ?Objective/Observations  ?Physical Exam: ?Gen: NAD, resting comfortably ?Pulm: Normal work of breathing.  Speaking in full sentences.  No signs of respiratory distress. ?Neuro: Grossly normal, moves all extremities ?Psych: Normal affect and thought  content ? ?Virtual Visit via Video  ? ?I connected with Emily [name] on 06/06/21 at 11:20 AM EDT by a video enabled telemedicine application and verified that I am speaking with the correct person using two identifiers. The limitations of evaluation and management by telemedicine and the availability of in person appointments were discussed. The patient expressed understanding and agreed to proceed.  ? ?Patient location: Home ?Provider location: [place] ?Persons participating in the virtual visit: Myself and Patient ?   ? ?[name]. Jerline Pain, MD ?06/06/2021 11:37 AM  ?

## 2021-06-06 NOTE — Assessment & Plan Note (Signed)
Her mother passed away about 6 months ago.  This has been a large source of stress for her.  No reported SI or HI.  She is not interested in seeing a counselor or therapist at this point.  She is on Celexa 40 mg daily.  She did not feel like Wellbutrin was effective in the past.  We will add on Abilify 5 mg daily.  She will follow-up with me in a couple of weeks via MyChart. ?

## 2021-06-06 NOTE — Assessment & Plan Note (Signed)
Still has a significant amount of pain.  We discussed referral to sports medicine however she deferred.  She will let me know if she would like to pursue this in the future. ?

## 2021-06-08 ENCOUNTER — Other Ambulatory Visit: Payer: Self-pay | Admitting: Family Medicine

## 2021-06-09 ENCOUNTER — Encounter: Payer: Self-pay | Admitting: Family Medicine

## 2021-06-09 ENCOUNTER — Telehealth (INDEPENDENT_AMBULATORY_CARE_PROVIDER_SITE_OTHER): Payer: Medicare Other | Admitting: Family Medicine

## 2021-06-09 VITALS — Ht 65.0 in | Wt 150.0 lb

## 2021-06-09 DIAGNOSIS — J441 Chronic obstructive pulmonary disease with (acute) exacerbation: Secondary | ICD-10-CM | POA: Diagnosis not present

## 2021-06-09 MED ORDER — IPRATROPIUM-ALBUTEROL 0.5-2.5 (3) MG/3ML IN SOLN: 3.0000 mL | Freq: Four times a day (QID) | RESPIRATORY_TRACT | 0 refills | Status: DC | PRN
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
## 2021-06-09 NOTE — Patient Instructions (Signed)
Advise to go to ER for evaluation! ? ?Will send nebulizer meds to pharm.   ?

## 2021-06-10 ENCOUNTER — Emergency Department (HOSPITAL_COMMUNITY): Payer: Medicare Other

## 2021-06-10 ENCOUNTER — Inpatient Hospital Stay (HOSPITAL_COMMUNITY): Payer: Medicare Other

## 2021-06-10 ENCOUNTER — Encounter (HOSPITAL_COMMUNITY): Payer: Self-pay

## 2021-06-10 ENCOUNTER — Other Ambulatory Visit: Payer: Self-pay

## 2021-06-10 ENCOUNTER — Inpatient Hospital Stay (HOSPITAL_COMMUNITY)
Admission: EM | Admit: 2021-06-10 | Discharge: 2021-06-13 | DRG: 190 | Disposition: A | Discharge status: Home / Self Care | Payer: Medicare Other | Attending: Family Medicine | Admitting: Family Medicine

## 2021-06-10 DIAGNOSIS — G4700 Insomnia, unspecified: Secondary | ICD-10-CM | POA: Diagnosis present

## 2021-06-10 DIAGNOSIS — M810 Age-related osteoporosis without current pathological fracture: Secondary | ICD-10-CM | POA: Diagnosis present

## 2021-06-10 DIAGNOSIS — Z794 Long term (current) use of insulin: Secondary | ICD-10-CM

## 2021-06-10 DIAGNOSIS — Z923 Personal history of irradiation: Secondary | ICD-10-CM

## 2021-06-10 DIAGNOSIS — N1831 Chronic kidney disease, stage 3a: Secondary | ICD-10-CM | POA: Diagnosis present

## 2021-06-10 DIAGNOSIS — J9601 Acute respiratory failure with hypoxia: Secondary | ICD-10-CM | POA: Diagnosis present

## 2021-06-10 DIAGNOSIS — I2510 Atherosclerotic heart disease of native coronary artery without angina pectoris: Secondary | ICD-10-CM | POA: Diagnosis not present

## 2021-06-10 DIAGNOSIS — I6529 Occlusion and stenosis of unspecified carotid artery: Secondary | ICD-10-CM | POA: Diagnosis present

## 2021-06-10 DIAGNOSIS — Z803 Family history of malignant neoplasm of breast: Secondary | ICD-10-CM | POA: Diagnosis not present

## 2021-06-10 DIAGNOSIS — R0602 Shortness of breath: Secondary | ICD-10-CM | POA: Diagnosis not present

## 2021-06-10 DIAGNOSIS — J441 Chronic obstructive pulmonary disease with (acute) exacerbation: Principal | ICD-10-CM | POA: Diagnosis present

## 2021-06-10 DIAGNOSIS — Z853 Personal history of malignant neoplasm of breast: Secondary | ICD-10-CM | POA: Diagnosis not present

## 2021-06-10 DIAGNOSIS — I152 Hypertension secondary to endocrine disorders: Secondary | ICD-10-CM | POA: Diagnosis not present

## 2021-06-10 DIAGNOSIS — J449 Chronic obstructive pulmonary disease, unspecified: Secondary | ICD-10-CM | POA: Diagnosis present

## 2021-06-10 DIAGNOSIS — Z91041 Radiographic dye allergy status: Secondary | ICD-10-CM

## 2021-06-10 DIAGNOSIS — I6523 Occlusion and stenosis of bilateral carotid arteries: Secondary | ICD-10-CM | POA: Diagnosis not present

## 2021-06-10 DIAGNOSIS — Z86007 Personal history of in-situ neoplasm of skin: Secondary | ICD-10-CM

## 2021-06-10 DIAGNOSIS — R0902 Hypoxemia: Secondary | ICD-10-CM | POA: Diagnosis not present

## 2021-06-10 DIAGNOSIS — K219 Gastro-esophageal reflux disease without esophagitis: Secondary | ICD-10-CM | POA: Diagnosis present

## 2021-06-10 DIAGNOSIS — K861 Other chronic pancreatitis: Secondary | ICD-10-CM | POA: Diagnosis not present

## 2021-06-10 DIAGNOSIS — E1159 Type 2 diabetes mellitus with other circulatory complications: Secondary | ICD-10-CM

## 2021-06-10 DIAGNOSIS — R9431 Abnormal electrocardiogram [ECG] [EKG]: Secondary | ICD-10-CM | POA: Diagnosis not present

## 2021-06-10 DIAGNOSIS — E785 Hyperlipidemia, unspecified: Secondary | ICD-10-CM

## 2021-06-10 DIAGNOSIS — E1169 Type 2 diabetes mellitus with other specified complication: Secondary | ICD-10-CM | POA: Diagnosis not present

## 2021-06-10 DIAGNOSIS — Z833 Family history of diabetes mellitus: Secondary | ICD-10-CM | POA: Diagnosis not present

## 2021-06-10 DIAGNOSIS — Z20822 Contact with and (suspected) exposure to covid-19: Secondary | ICD-10-CM | POA: Diagnosis present

## 2021-06-10 DIAGNOSIS — I130 Hypertensive heart and chronic kidney disease with heart failure and stage 1 through stage 4 chronic kidney disease, or unspecified chronic kidney disease: Secondary | ICD-10-CM | POA: Diagnosis not present

## 2021-06-10 DIAGNOSIS — E1122 Type 2 diabetes mellitus with diabetic chronic kidney disease: Secondary | ICD-10-CM | POA: Diagnosis present

## 2021-06-10 DIAGNOSIS — E1149 Type 2 diabetes mellitus with other diabetic neurological complication: Secondary | ICD-10-CM | POA: Diagnosis not present

## 2021-06-10 DIAGNOSIS — Z7982 Long term (current) use of aspirin: Secondary | ICD-10-CM

## 2021-06-10 DIAGNOSIS — Z79899 Other long term (current) drug therapy: Secondary | ICD-10-CM

## 2021-06-10 DIAGNOSIS — Z90710 Acquired absence of both cervix and uterus: Secondary | ICD-10-CM | POA: Diagnosis not present

## 2021-06-10 DIAGNOSIS — E1165 Type 2 diabetes mellitus with hyperglycemia: Secondary | ICD-10-CM | POA: Diagnosis present

## 2021-06-10 DIAGNOSIS — Z8052 Family history of malignant neoplasm of bladder: Secondary | ICD-10-CM

## 2021-06-10 DIAGNOSIS — I2584 Coronary atherosclerosis due to calcified coronary lesion: Secondary | ICD-10-CM

## 2021-06-10 DIAGNOSIS — R918 Other nonspecific abnormal finding of lung field: Secondary | ICD-10-CM | POA: Diagnosis not present

## 2021-06-10 DIAGNOSIS — I10 Essential (primary) hypertension: Secondary | ICD-10-CM | POA: Diagnosis present

## 2021-06-10 DIAGNOSIS — Z8041 Family history of malignant neoplasm of ovary: Secondary | ICD-10-CM | POA: Diagnosis not present

## 2021-06-10 DIAGNOSIS — I5032 Chronic diastolic (congestive) heart failure: Secondary | ICD-10-CM | POA: Diagnosis not present

## 2021-06-10 DIAGNOSIS — K760 Fatty (change of) liver, not elsewhere classified: Secondary | ICD-10-CM | POA: Diagnosis present

## 2021-06-10 DIAGNOSIS — R069 Unspecified abnormalities of breathing: Secondary | ICD-10-CM | POA: Diagnosis not present

## 2021-06-10 DIAGNOSIS — S2231XA Fracture of one rib, right side, initial encounter for closed fracture: Secondary | ICD-10-CM | POA: Diagnosis not present

## 2021-06-10 DIAGNOSIS — F17210 Nicotine dependence, cigarettes, uncomplicated: Secondary | ICD-10-CM | POA: Diagnosis present

## 2021-06-10 DIAGNOSIS — Z743 Need for continuous supervision: Secondary | ICD-10-CM | POA: Diagnosis not present

## 2021-06-10 DIAGNOSIS — F419 Anxiety disorder, unspecified: Secondary | ICD-10-CM | POA: Diagnosis present

## 2021-06-10 DIAGNOSIS — I429 Cardiomyopathy, unspecified: Secondary | ICD-10-CM | POA: Diagnosis present

## 2021-06-10 DIAGNOSIS — I5042 Chronic combined systolic (congestive) and diastolic (congestive) heart failure: Secondary | ICD-10-CM | POA: Diagnosis not present

## 2021-06-10 DIAGNOSIS — Z8249 Family history of ischemic heart disease and other diseases of the circulatory system: Secondary | ICD-10-CM

## 2021-06-10 DIAGNOSIS — T380X5A Adverse effect of glucocorticoids and synthetic analogues, initial encounter: Secondary | ICD-10-CM | POA: Diagnosis present

## 2021-06-10 DIAGNOSIS — Z9221 Personal history of antineoplastic chemotherapy: Secondary | ICD-10-CM | POA: Diagnosis not present

## 2021-06-10 DIAGNOSIS — I700 Atherosclerosis of aorta: Secondary | ICD-10-CM | POA: Diagnosis not present

## 2021-06-10 DIAGNOSIS — R739 Hyperglycemia, unspecified: Secondary | ICD-10-CM | POA: Diagnosis not present

## 2021-06-10 DIAGNOSIS — M792 Neuralgia and neuritis, unspecified: Secondary | ICD-10-CM | POA: Diagnosis not present

## 2021-06-10 DIAGNOSIS — F17200 Nicotine dependence, unspecified, uncomplicated: Secondary | ICD-10-CM | POA: Diagnosis present

## 2021-06-10 DIAGNOSIS — Z7984 Long term (current) use of oral hypoglycemic drugs: Secondary | ICD-10-CM

## 2021-06-10 DIAGNOSIS — E1140 Type 2 diabetes mellitus with diabetic neuropathy, unspecified: Secondary | ICD-10-CM | POA: Diagnosis present

## 2021-06-10 LAB — CBC WITH DIFFERENTIAL/PLATELET
Abs Immature Granulocytes: 0.04 10*3/uL (ref 0.00–0.07)
Basophils Absolute: 0 10*3/uL (ref 0.0–0.1)
Basophils Relative: 0 %
Eosinophils Absolute: 0.2 10*3/uL (ref 0.0–0.5)
Eosinophils Relative: 1 %
HCT: 44.3 % (ref 36.0–46.0)
Hemoglobin: 14.6 g/dL (ref 12.0–15.0)
Immature Granulocytes: 0 %
Lymphocytes Relative: 32 %
Lymphs Abs: 3.6 10*3/uL (ref 0.7–4.0)
MCH: 28.5 pg (ref 26.0–34.0)
MCHC: 33 g/dL (ref 30.0–36.0)
MCV: 86.5 fL (ref 80.0–100.0)
Monocytes Absolute: 0.5 10*3/uL (ref 0.1–1.0)
Monocytes Relative: 4 %
Neutro Abs: 7.1 10*3/uL (ref 1.7–7.7)
Neutrophils Relative %: 63 %
Platelets: 186 10*3/uL (ref 150–400)
RBC: 5.12 MIL/uL — ABNORMAL HIGH (ref 3.87–5.11)
RDW: 13.4 % (ref 11.5–15.5)
WBC: 11.4 10*3/uL — ABNORMAL HIGH (ref 4.0–10.5)
nRBC: 0 % (ref 0.0–0.2)

## 2021-06-10 LAB — COMPREHENSIVE METABOLIC PANEL
ALT: 22 U/L (ref 0–44)
AST: 21 U/L (ref 15–41)
Albumin: 3.6 g/dL (ref 3.5–5.0)
Alkaline Phosphatase: 48 U/L (ref 38–126)
Anion gap: 9 (ref 5–15)
BUN: 25 mg/dL — ABNORMAL HIGH (ref 8–23)
CO2: 27 mmol/L (ref 22–32)
Calcium: 8.4 mg/dL — ABNORMAL LOW (ref 8.9–10.3)
Chloride: 102 mmol/L (ref 98–111)
Creatinine, Ser: 0.98 mg/dL (ref 0.44–1.00)
GFR, Estimated: >60 mL/min (ref >60)
Glucose, Bld: 264 mg/dL — ABNORMAL HIGH (ref 70–99)
Potassium: 3.5 mmol/L (ref 3.5–5.1)
Sodium: 138 mmol/L (ref 135–145)
Total Bilirubin: 0.6 mg/dL (ref 0.3–1.2)
Total Protein: 7.3 g/dL (ref 6.5–8.1)

## 2021-06-10 LAB — RESP PANEL BY RT-PCR (FLU A&B, COVID) ARPGX2
Influenza A by PCR: NEGATIVE
Influenza B by PCR: NEGATIVE
SARS Coronavirus 2 by RT PCR: NEGATIVE

## 2021-06-10 LAB — BLOOD GAS, VENOUS
Acid-Base Excess: 4.8 mmol/L — ABNORMAL HIGH (ref 0.0–2.0)
Bicarbonate: 29.9 mmol/L — ABNORMAL HIGH (ref 20.0–28.0)
Drawn by: 6509
FIO2: 52 %
O2 Saturation: 96.8 %
Patient temperature: 37
pCO2, Ven: 45 mmHg (ref 44–60)
pH, Ven: 7.43 (ref 7.25–7.43)
pO2, Ven: 79 mmHg — ABNORMAL HIGH (ref 32–45)

## 2021-06-10 LAB — TROPONIN I (HIGH SENSITIVITY)
Troponin I (High Sensitivity): 5 ng/L (ref <18)
Troponin I (High Sensitivity): 5 ng/L (ref <18)

## 2021-06-10 LAB — GLUCOSE, CAPILLARY
Glucose-Capillary: 341 mg/dL — ABNORMAL HIGH (ref 70–99)
Glucose-Capillary: 272 mg/dL — ABNORMAL HIGH (ref 70–99)

## 2021-06-10 LAB — HIV ANTIBODY (ROUTINE TESTING W REFLEX): HIV Screen 4th Generation wRfx: NONREACTIVE

## 2021-06-10 LAB — HEMOGLOBIN A1C
Hgb A1c MFr Bld: 10.1 % — ABNORMAL HIGH (ref 4.8–5.6)
Mean Plasma Glucose: 243.17 mg/dL

## 2021-06-10 LAB — LACTIC ACID, PLASMA
Lactic Acid, Venous: 1.5 mmol/L (ref 0.5–1.9)
Lactic Acid, Venous: 1.2 mmol/L (ref 0.5–1.9)

## 2021-06-10 LAB — PROCALCITONIN: Procalcitonin: <0.1 ng/mL

## 2021-06-10 MED ORDER — HYDROXYZINE HCL 25 MG PO TABS
25.0000 mg | ORAL_TABLET | Freq: Three times a day (TID) | ORAL | Status: DC | PRN
  Administered 2021-06-11: 25 mg via ORAL
  Filled 2021-06-10: qty 1

## 2021-06-10 MED ORDER — CARVEDILOL 3.125 MG PO TABS
6.2500 mg | ORAL_TABLET | Freq: Two times a day (BID) | ORAL | Status: DC
  Administered 2021-06-10 – 2021-06-13 (×6): 6.25 mg via ORAL
  Filled 2021-06-10 (×6): qty 2

## 2021-06-10 MED ORDER — LORAZEPAM 1 MG PO TABS
1.0000 mg | ORAL_TABLET | Freq: Two times a day (BID) | ORAL | Status: DC | PRN
  Administered 2021-06-10 – 2021-06-13 (×6): 1 mg via ORAL
  Filled 2021-06-10 (×6): qty 1

## 2021-06-10 MED ORDER — ENOXAPARIN SODIUM 40 MG/0.4ML IJ SOSY
40.0000 mg | PREFILLED_SYRINGE | INTRAMUSCULAR | Status: DC
  Administered 2021-06-10 – 2021-06-12 (×3): 40 mg via SUBCUTANEOUS
  Filled 2021-06-10 (×3): qty 0.4

## 2021-06-10 MED ORDER — ALBUTEROL SULFATE HFA 108 (90 BASE) MCG/ACT IN AERS: 2.0000 | INHALATION_SPRAY | RESPIRATORY_TRACT | Status: DC | PRN

## 2021-06-10 MED ORDER — IPRATROPIUM-ALBUTEROL 0.5-2.5 (3) MG/3ML IN SOLN
3.0000 mL | Freq: Four times a day (QID) | RESPIRATORY_TRACT | Status: DC
  Administered 2021-06-10 – 2021-06-12 (×9): 3 mL via RESPIRATORY_TRACT
  Filled 2021-06-10 (×9): qty 3

## 2021-06-10 MED ORDER — LEVOFLOXACIN IN D5W 750 MG/150ML IV SOLN: 750.0000 mg | INTRAVENOUS | Status: DC

## 2021-06-10 MED ORDER — CITALOPRAM HYDROBROMIDE 20 MG PO TABS
20.0000 mg | ORAL_TABLET | Freq: Every day | ORAL | Status: DC
  Administered 2021-06-11 – 2021-06-13 (×3): 20 mg via ORAL
  Filled 2021-06-10 (×3): qty 1

## 2021-06-10 MED ORDER — METHYLPREDNISOLONE SODIUM SUCC 125 MG IJ SOLR
125.0000 mg | Freq: Once | INTRAMUSCULAR | Status: AC
Start: 2021-06-10 — End: 2021-06-10
  Administered 2021-06-10: 125 mg via INTRAVENOUS
  Filled 2021-06-10: qty 2

## 2021-06-10 MED ORDER — SACUBITRIL-VALSARTAN 49-51 MG PO TABS
1.0000 | ORAL_TABLET | Freq: Two times a day (BID) | ORAL | Status: DC
  Administered 2021-06-10 – 2021-06-13 (×6): 1 via ORAL
  Filled 2021-06-10 (×6): qty 1

## 2021-06-10 MED ORDER — ONDANSETRON HCL 4 MG PO TABS: 4.0000 mg | ORAL_TABLET | Freq: Four times a day (QID) | ORAL | Status: DC | PRN

## 2021-06-10 MED ORDER — ACETAMINOPHEN 325 MG PO TABS
650.0000 mg | ORAL_TABLET | Freq: Four times a day (QID) | ORAL | Status: DC
  Administered 2021-06-10 – 2021-06-13 (×8): 650 mg via ORAL
  Filled 2021-06-10 (×10): qty 2

## 2021-06-10 MED ORDER — IPRATROPIUM-ALBUTEROL 0.5-2.5 (3) MG/3ML IN SOLN
RESPIRATORY_TRACT | Status: AC
  Filled 2021-06-10: qty 3

## 2021-06-10 MED ORDER — ACETAMINOPHEN 325 MG PO TABS: 650.0000 mg | ORAL_TABLET | Freq: Four times a day (QID) | ORAL | Status: DC | PRN

## 2021-06-10 MED ORDER — ASPIRIN EC 81 MG PO TBEC
81.0000 mg | DELAYED_RELEASE_TABLET | Freq: Every day | ORAL | Status: DC
  Administered 2021-06-11 – 2021-06-13 (×3): 81 mg via ORAL
  Filled 2021-06-10 (×4): qty 1

## 2021-06-10 MED ORDER — ALBUTEROL SULFATE (2.5 MG/3ML) 0.083% IN NEBU
INHALATION_SOLUTION | RESPIRATORY_TRACT | Status: AC
Start: 2021-06-10 — End: 2021-06-10
  Administered 2021-06-10: 5 mg
  Filled 2021-06-10: qty 6

## 2021-06-10 MED ORDER — OXYCODONE HCL 5 MG PO TABS
5.0000 mg | ORAL_TABLET | Freq: Four times a day (QID) | ORAL | Status: DC | PRN
  Administered 2021-06-10 – 2021-06-13 (×7): 5 mg via ORAL
  Filled 2021-06-10 (×8): qty 1

## 2021-06-10 MED ORDER — MOMETASONE FURO-FORMOTEROL FUM 200-5 MCG/ACT IN AERO
2.0000 | INHALATION_SPRAY | Freq: Two times a day (BID) | RESPIRATORY_TRACT | Status: DC
  Administered 2021-06-10 – 2021-06-13 (×6): 2 via RESPIRATORY_TRACT
  Filled 2021-06-10: qty 8.8

## 2021-06-10 MED ORDER — CITALOPRAM HYDROBROMIDE 20 MG PO TABS: 40.0000 mg | ORAL_TABLET | Freq: Every day | ORAL | Status: DC

## 2021-06-10 MED ORDER — DOXYCYCLINE HYCLATE 100 MG PO TABS
100.0000 mg | ORAL_TABLET | Freq: Two times a day (BID) | ORAL | Status: DC
  Administered 2021-06-10 – 2021-06-13 (×6): 100 mg via ORAL
  Filled 2021-06-10 (×7): qty 1

## 2021-06-10 MED ORDER — ACETAMINOPHEN 650 MG RE SUPP: 650.0000 mg | Freq: Four times a day (QID) | RECTAL | Status: DC | PRN

## 2021-06-10 MED ORDER — METHYLPREDNISOLONE SODIUM SUCC 125 MG IJ SOLR
80.0000 mg | Freq: Two times a day (BID) | INTRAMUSCULAR | Status: DC
  Administered 2021-06-10 – 2021-06-11 (×2): 80 mg via INTRAVENOUS
  Filled 2021-06-10 (×2): qty 2

## 2021-06-10 MED ORDER — INSULIN GLARGINE-YFGN 100 UNIT/ML ~~LOC~~ SOLN
15.0000 [IU] | Freq: Every day | SUBCUTANEOUS | Status: DC
Start: 2021-06-10 — End: 2021-06-11
  Administered 2021-06-10: 15 [IU] via SUBCUTANEOUS
  Filled 2021-06-10: qty 0.15
  Filled 2021-06-10: qty 10
  Filled 2021-06-10 (×2): qty 0.15

## 2021-06-10 MED ORDER — NICOTINE 21 MG/24HR TD PT24
21.0000 mg | MEDICATED_PATCH | Freq: Every day | TRANSDERMAL | Status: DC
  Administered 2021-06-10 – 2021-06-13 (×4): 21 mg via TRANSDERMAL
  Filled 2021-06-10 (×4): qty 1

## 2021-06-10 MED ORDER — ACETAMINOPHEN 650 MG RE SUPP: 650.0000 mg | Freq: Four times a day (QID) | RECTAL | Status: DC

## 2021-06-10 MED ORDER — ONDANSETRON HCL 4 MG/2ML IJ SOLN
4.0000 mg | Freq: Four times a day (QID) | INTRAMUSCULAR | Status: DC | PRN
Start: 2021-06-10 — End: 2021-06-13

## 2021-06-10 MED ORDER — SODIUM CHLORIDE 0.9 % IV SOLN
2.0000 g | INTRAVENOUS | Status: AC
  Administered 2021-06-10 – 2021-06-11 (×2): 2 g via INTRAVENOUS
  Filled 2021-06-10 (×2): qty 20

## 2021-06-10 MED ORDER — BISACODYL 5 MG PO TBEC
5.0000 mg | DELAYED_RELEASE_TABLET | Freq: Every day | ORAL | Status: DC | PRN
Start: 2021-06-10 — End: 2021-06-13

## 2021-06-10 MED ORDER — ATORVASTATIN CALCIUM 10 MG PO TABS
20.0000 mg | ORAL_TABLET | Freq: Every evening | ORAL | Status: DC
  Administered 2021-06-10 – 2021-06-12 (×3): 20 mg via ORAL
  Filled 2021-06-10 (×3): qty 2

## 2021-06-10 MED ORDER — INSULIN ASPART 100 UNIT/ML IJ SOLN
0.0000 [IU] | Freq: Three times a day (TID) | INTRAMUSCULAR | Status: DC
  Administered 2021-06-10 – 2021-06-11 (×2): 11 [IU] via SUBCUTANEOUS
  Administered 2021-06-11 – 2021-06-12 (×2): 8 [IU] via SUBCUTANEOUS
  Administered 2021-06-12: 15 [IU] via SUBCUTANEOUS

## 2021-06-10 MED ORDER — ARIPIPRAZOLE 5 MG PO TABS
5.0000 mg | ORAL_TABLET | Freq: Every day | ORAL | Status: DC
  Administered 2021-06-11 – 2021-06-13 (×3): 5 mg via ORAL
  Filled 2021-06-10 (×3): qty 1

## 2021-06-10 MED ORDER — INSULIN ASPART 100 UNIT/ML IJ SOLN
4.0000 [IU] | Freq: Three times a day (TID) | INTRAMUSCULAR | Status: DC
  Administered 2021-06-10: 4 [IU] via SUBCUTANEOUS

## 2021-06-10 MED ORDER — INSULIN ASPART 100 UNIT/ML IJ SOLN
0.0000 [IU] | Freq: Every day | INTRAMUSCULAR | Status: DC
  Administered 2021-06-10: 3 [IU] via SUBCUTANEOUS

## 2021-06-10 MED ORDER — ALBUTEROL SULFATE (2.5 MG/3ML) 0.083% IN NEBU: 5.0000 mg/h | INHALATION_SOLUTION | RESPIRATORY_TRACT | Status: DC

## 2021-06-10 MED ORDER — SODIUM CHLORIDE 0.9 % IV BOLUS
1000.0000 mL | Freq: Once | INTRAVENOUS | Status: AC
  Administered 2021-06-10: 1000 mL via INTRAVENOUS

## 2021-06-10 NOTE — Hospital Course (Signed)
61 year old female with longstanding COPD, chronic nicotine dependence with daily smoking, cardiomyopathy with systolic heart failure on Entresto, anxiety disorder, history of right breast cancer status post chemotherapy and XRT, chronic bronchitis, chronic depression and anxiety, type 2 diabetes mellitus, osteoarthritis and dyslipidemia, diabetic neuropathy presented to the emergency department with ongoing shortness of breath that has been present for the past week but symptoms have been progressive.  She had a video conference with her PCP earlier in the week and was prescribed a course of azithromycin and steroids.  She said initially she felt better after taking the medication but after completing her symptoms returned and she is continuing to have shortness of breath wheezing cough productive sputum.  She reports that her symptoms initially started with a sinus infection but the postnasal drainage into her chest has caused a lot of her respiratory symptoms.  Her main complaint is dyspnea.  She has no chest pain.  She has no palpitations.  She denies fever and chills.  She reports no known sick contacts but reports that she tends to get very ill with changes in weather.  She was initially evaluated in the emergency department and found to have an oxygen requirement of 4 L/min.  She is not oxygen dependent at baseline.  The patient also reports that she had improvement of symptoms after nebulizers given in ED.  Unfortunately with slight ambulation getting up to go to the bathroom she became severely dyspneic and her pulse ox dropped down to the 80% range.  She is now requiring 2-4 L/min supplemental oxygen to maintain her pulse ox greater than 92%.  She was given IV steroids, albuterol nebulizer treatments and admission was requested for further management. ?

## 2021-06-10 NOTE — Assessment & Plan Note (Signed)
--   Outpatient follow-up for ongoing surveillance ?
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
## 2021-06-10 NOTE — ED Provider Notes (Signed)
?[facility] ?Provider Note ? ? ?CSN: [id number] ?Arrival date & time: 06/10/21  1016 ? ?  ? ?History ? ?Chief Complaint  ?Patient presents with  ? Shortness of Breath  ? ? ?Emily [name] is a 61 y.o. female. ? ?HPI ? ?Patient with medical history including hypertension, diabetes, CHF recent EF is 10 to 60% CAD with catheterization back in 2017 presents emerged part with complaints of shortness of breath.  Patient states that she has been feeling short of breath for the last 5 days, states it came on suddenly, she endorses fevers chills nasal congestion chest tightness and slight fatigue.  She denies any pleuritic chest pain or chest pain itself, she states that she saw her primary care doctor via video chat she was started on steroids and antibiotics states that she finished them and started to feel better but now she started to feel worse.  She saw her doctor and a video chat again they recommend her to come back in for reevaluation.  She currently does not endorse fevers chills nutrition but states she still has a cough which is unproductive, no stomach pains nausea vomiting general body aches.  She is vaccines COVID influenza she is not immunocompromise any denies any recent sick contacts. ? ?Followed by Dr. Ronney Asters has chronic systolic heart failure recent EF 50 to 55% on Entresto spironolactone carvedilol Jardiance.  ? ? ? ?Home Medications ?Prior to Admission medications   ?Medication Sig Start Date End Date Taking? Authorizing Provider  ?albuterol (VENTOLIN HFA) 108 (90 Base) MCG/ACT inhaler Inhale 2 puffs into the lungs every 6 (six) hours as needed for wheezing or shortness of breath. 08/11/20  Yes [name], MD  ?ARIPiprazole (ABILIFY) 5 MG tablet Take 1 tablet (5 mg total) by mouth daily. 06/06/21  Yes [name], MD  ?aspirin EC 81 MG tablet Take 1 tablet (81 mg total) by mouth daily. 03/27/13  Yes Bensimhon, Shaune Pascal, MD  ?atorvastatin (LIPITOR) 20 MG tablet TAKE 1  TABLET BY MOUTH ONCE DAILY KEEP  APPOINTMENT  FOR  FURTHER  REFILLS 02/01/21  Yes Bensimhon, Shaune Pascal, MD  ?carvedilol (COREG) 6.25 MG tablet TAKE 1 TABLET BY MOUTH TWICE DAILY WITH A MEAL 06/07/21  Yes Bensimhon, Shaune Pascal, MD  ?citalopram (CELEXA) 40 MG tablet Take 1 tablet (40 mg total) by mouth daily. 11/05/20  Yes [name], MD  ?empagliflozin (JARDIANCE) 25 MG TABS tablet Take 1 tablet (25 mg total) by mouth daily before breakfast. 03/31/21  Yes Bensimhon, Shaune Pascal, MD  ?hydrOXYzine (ATARAX) 50 MG tablet TAKE 1 TABLET BY MOUTH THREE TIMES DAILY AS NEEDED FOR ITCHING 06/06/21  Yes [name], MD  ?Insulin Degludec (TRESIBA [place]) Inject 20 Units into the skin daily.   Yes [provider]  ?ipratropium-albuterol (DUONEB) 0.5-2.5 (3) MG/3ML SOLN Take 3 mLs by nebulization every 6 (six) hours as needed. 06/09/21  Yes Tawnya Crook, MD  ?LORazepam (ATIVAN) 2 MG tablet TAKE 1 TABLET BY MOUTH AT BEDTIME AS NEEDED FOR ANXIETY 03/07/21  Yes [name], MD  ?metFORMIN (GLUCOPHAGE XR) 500 MG 24 hr tablet Take 2 tablets (1,000 mg total) by mouth 2 (two) times daily. 11/22/20  Yes [name], MD  ?sacubitril-valsartan (ENTRESTO) 49-51 MG Take 1 tablet by mouth 2 (two) times daily. 05/26/21  Yes Bensimhon, Shaune Pascal, MD  ?Blood Glucose Monitoring Suppl (ONE TOUCH ULTRA MINI) w/Device KIT Use to test blood sugars daily. Dx: E11.9 12/02/20  [name], MD  ?   ? ?Allergies    ?Contrast media [iodinated contrast media], Gadolinium, and Iodine-131   ? ?Review of Systems   ?Review of Systems  ?Constitutional:  Negative for chills and fever.  ?Respiratory:  Positive for cough and shortness of breath.   ?Cardiovascular:  Negative for chest pain.  ?Gastrointestinal:  Negative for abdominal pain, diarrhea, nausea and vomiting.  ?Neurological:  Negative for headaches.  ? ?Physical Exam ?Updated Vital Signs ?BP (!) 154/74   Pulse (!) 110   Temp 98 ?F (36.7 ?C) (Oral)   Resp 20   Ht 5' 5"  (1.651 m)   Wt 68 kg    SpO2 90%   BMI 24.95 kg/m?  ?Physical Exam ?Vitals and nursing note reviewed.  ?Constitutional:   ?   General: She is in acute distress.  ?   Appearance: She is not ill-appearing.  ?HENT:  ?   Head: Normocephalic and atraumatic.  ?   Nose: No congestion.  ?   Mouth/Throat:  ?   Mouth: Mucous membranes are dry.  ?   Pharynx: Oropharynx is clear.  ?Eyes:  ?   Extraocular Movements: Extraocular movements intact.  ?   Conjunctiva/sclera: Conjunctivae normal.  ?   Pupils: Pupils are equal, round, and reactive to light.  ?Cardiovascular:  ?   Rate and Rhythm: Regular rhythm.  ?   Pulses: Normal pulses.  ?   Heart sounds: No murmur heard. ?  No friction rub. No gallop.  ?Pulmonary:  ?   Effort: No respiratory distress.  ?   Breath sounds: Wheezing present. No rales.  ?   Comments: Patient appears to be in slight respiratory distress, she is tachypneic, unable to speak in full sentences, with slight accessory muscle usage, lung sounds were assessed she has decreased lung sounds in the lower lobes with slight expiratory wheezing no rales rhonchi or stridor present. ? ?She is currently requiring 4 L via nasal cannula, I did remove it during my examination she dipped down to the mid 80s, placed back on 2 L.  Remains in the low 90s ?Abdominal:  ?   Palpations: Abdomen is soft.  ?   Tenderness: There is no abdominal tenderness. There is no right CVA tenderness or left CVA tenderness.  ?Musculoskeletal:  ?   Right lower leg: No edema.  ?   Left lower leg: No edema.  ?Skin: ?   General: Skin is warm and dry.  ?Neurological:  ?   Mental Status: She is alert.  ?Psychiatric:     ?   Mood and Affect: Mood normal.  ? ? ?ED Results / Procedures / Treatments   ?Labs ?(all labs ordered are listed, but only abnormal results are displayed) ?Labs Reviewed  ?CBC WITH DIFFERENTIAL/PLATELET - Abnormal; Notable for the following components:  ?    Result Value  ? WBC 11.4 (*)   ? RBC 5.12 (*)   ? All other components within normal limits   ?COMPREHENSIVE METABOLIC PANEL - Abnormal; Notable for the following components:  ? Glucose, Bld 264 (*)   ? BUN 25 (*)   ? Calcium 8.4 (*)   ? All other components within normal limits  ?BLOOD GAS, VENOUS - Abnormal; Notable for the following components:  ? pO2, Ven 79 (*)   ? Bicarbonate 29.9 (*)   ? Acid-Base Excess 4.8 (*)   ? All other components within normal limits  ?RESP PANEL BY RT-PCR (FLU A&B, COVID) ARPGX2  ?EXPECTORATED  SPUTUM ASSESSMENT W GRAM STAIN, RFLX TO RESP C  ?RESPIRATORY PANEL BY PCR  ?LACTIC ACID, PLASMA  ?LACTIC ACID, PLASMA  ?HEMOGLOBIN A1C  ?HIV ANTIBODY (ROUTINE TESTING W REFLEX)  ?PROCALCITONIN  ?TROPONIN I (HIGH SENSITIVITY)  ?TROPONIN I (HIGH SENSITIVITY)  ? ? ?EKG ?None ? ?Radiology ?DG Chest Portable 1 View ? ?Result Date: 06/10/2021 ?CLINICAL DATA:  Patient c/o SOB. Per EMS patient O2 on room air 86%. Hx of COPD. EXAM: PORTABLE CHEST - 1 VIEW COMPARISON:  02/02/2020 FINDINGS: Linear scarring or subsegmental atelectasis laterally at the left lung base. Lungs otherwise clear. Coarse somewhat attenuated bronchovascular markings Heart size and mediastinal contours are within normal limits. No effusion. Old right rib fracture deformities.  Clips in the right axilla. IMPRESSION: No acute cardiopulmonary disease. Electronically Signed   By: Lucrezia Europe M.D.   On: 06/10/2021 10:45   ? ?Procedures ?Procedures  ? ? ?Medications Ordered in ED ?Medications  ?aspirin EC tablet 81 mg (has no administration in time range)  ?atorvastatin (LIPITOR) tablet 20 mg (has no administration in time range)  ?carvedilol (COREG) tablet 6.25 mg (has no administration in time range)  ?sacubitril-valsartan (ENTRESTO) 49-51 mg per tablet (has no administration in time range)  ?ARIPiprazole (ABILIFY) tablet 5 mg (has no administration in time range)  ?hydrOXYzine (ATARAX) tablet 25 mg (has no administration in time range)  ?LORazepam (ATIVAN) tablet 1 mg (1 mg Oral Given 06/10/21 1532)  ?insulin glargine-yfgn  (SEMGLEE) injection 15 Units (has no administration in time range)  ?insulin aspart (novoLOG) injection 0-15 Units (has no administration in time range)  ?insulin aspart (novoLOG) injection 0-5 Units (has no administration

## 2021-06-10 NOTE — Assessment & Plan Note (Signed)
--   Resume home atorvastatin daily in the evening ?

## 2021-06-10 NOTE — Assessment & Plan Note (Signed)
--   Currently stable no symptoms at this time. ?

## 2021-06-10 NOTE — Assessment & Plan Note (Signed)
--   Resume lorazepam as needed for symptoms ?

## 2021-06-10 NOTE — Assessment & Plan Note (Signed)
--   Resume home medication and work on glycemic control ?

## 2021-06-10 NOTE — Assessment & Plan Note (Addendum)
--   Patient presents with severe symptomatic disease requiring inpatient management ?-- Patient has failed outpatient management ?-- IV steroids ordered every 12 hours ?-- Aggressive bronchodilator treatments ordered around-the-clock ?-- Check procalcitonin ?-- continue doxycycline.  Completed ceftriaxone.   ?-- Continue supplemental oxygenation with goal of weaning to room air when possible ?

## 2021-06-10 NOTE — Assessment & Plan Note (Addendum)
--   Pt experiencing some steroid-induced hyperglycemia and had outside food brought in ?-- continue basal insulin with prandial coverage and SSI coverage with frequent CBG monitoring ?-- we are slowly reducing her IV steroid.  Resistant SSI scale ordered.   ?

## 2021-06-10 NOTE — Assessment & Plan Note (Signed)
--   Resume home blood pressure medications and follow ?

## 2021-06-10 NOTE — ED Notes (Signed)
Upon assessment patient O2 sats 84% on 2L [place]. Patient placed on 4L [place]. Patient O2 sats 90%. ?

## 2021-06-10 NOTE — Assessment & Plan Note (Signed)
--   Recommend ambulatory referral to [facility] pulmonology after discharge for outpatient surveillance ?

## 2021-06-10 NOTE — Assessment & Plan Note (Signed)
--   No active chest pain symptoms at this time, we are resuming all of her home cardiac medications ?

## 2021-06-10 NOTE — Assessment & Plan Note (Addendum)
--   Patient has a new oxygen requirement and we are working to wean oxygen to room air as able ? ?-- Pt continues to rapidly desaturate down to 80% when off oxygen.   ?

## 2021-06-10 NOTE — Assessment & Plan Note (Signed)
--   Counseled on cessation at bedside, nicotine patch ordered at the high-dose 21 mg for craving relief ?

## 2021-06-10 NOTE — ED Triage Notes (Addendum)
Patient arrived via EMS from home with complaints of SOB. Per EMS patient O2 on room air 86%. Patient placed on 4L [place] satting 94%. Hx of COPD. Patient states she has been using albuterol without relief.  ?

## 2021-06-10 NOTE — Assessment & Plan Note (Signed)
-   Protonix ordered for GI protection 

## 2021-06-10 NOTE — ED Notes (Signed)
Patient states she removed cardiac monitor leads "because they were getting on my nerves." Patient educated on importance of cardiac monitoring. Patient agreed to keep pulse ox on finger. ?

## 2021-06-10 NOTE — Assessment & Plan Note (Signed)
--   Currently stable continue to monitor closely, renal dose medications as appropriate ?

## 2021-06-10 NOTE — Progress Notes (Signed)
?   06/10/21 1820  ?Assess: MEWS Score  ?Temp 98.3 ?F (36.8 ?C)  ?BP 128/84  ?Pulse Rate (!) 113 ?(Nurse [employer])  ?Resp 20  ?SpO2 91 %  ?O2 Device Nasal Cannula  ?Assess: MEWS Score  ?MEWS Temp 0  ?MEWS Systolic 0  ?MEWS Pulse 2  ?MEWS RR 0  ?MEWS LOC 0  ?MEWS Score 2  ?MEWS Score Color Yellow  ?Assess: if the MEWS score is Yellow or Red  ?Were vital signs taken at a resting state? Yes  ?Focused Assessment No change from prior assessment  ?Early Detection of Sepsis Score *See Row Information* Low  ?MEWS guidelines implemented *See Row Information* Yes  ?Treat  ?MEWS Interventions Other (Comment) ?(notified physician)  ?Pain Scale 0-10  ?Pain Score 8  ?Pain Type Acute pain  ?Pain Location Rib cage  ?Pain Orientation Right;Mid  ?Pain Descriptors / Indicators Aching  ?Pain Frequency Intermittent ?(with cough)  ?Pain Onset On-going  ?Take Vital Signs  ?Increase Vital Sign Frequency  Yellow: Q 2hr X 2 then Q 4hr X 2, if remains yellow, continue Q 4hrs  ?Notify: Charge Nurse/RN  ?Name of Charge Nurse/RN Notified Izora Gala RN  ?Date Charge Nurse/RN Notified 06/10/21  ?Time Charge Nurse/RN Notified 1820  ?Notify: Provider  ?Provider Name/Title Dr Wynetta Emery  ?Date Provider Notified 06/10/21  ?Time Provider Notified [phone]  ?Notification Type Page  ?Notification Reason Change in status  ?Provider response No new orders  ?Date of Provider Response 06/10/21  ?Time of Provider Response [phone]  ? ?Physician notified  ?

## 2021-06-10 NOTE — Assessment & Plan Note (Signed)
Stable

## 2021-06-10 NOTE — Assessment & Plan Note (Addendum)
--   We restarted all of her home heart failure medications ?-- Monitor intake output and daily weights ?

## 2021-06-11 ENCOUNTER — Inpatient Hospital Stay (HOSPITAL_COMMUNITY): Payer: Medicare Other

## 2021-06-11 DIAGNOSIS — I2510 Atherosclerotic heart disease of native coronary artery without angina pectoris: Secondary | ICD-10-CM | POA: Diagnosis not present

## 2021-06-11 DIAGNOSIS — N1831 Chronic kidney disease, stage 3a: Secondary | ICD-10-CM | POA: Diagnosis not present

## 2021-06-11 DIAGNOSIS — I5032 Chronic diastolic (congestive) heart failure: Secondary | ICD-10-CM | POA: Diagnosis not present

## 2021-06-11 DIAGNOSIS — J441 Chronic obstructive pulmonary disease with (acute) exacerbation: Secondary | ICD-10-CM | POA: Diagnosis not present

## 2021-06-11 LAB — CBC WITH DIFFERENTIAL/PLATELET
Abs Immature Granulocytes: 0.04 10*3/uL (ref 0.00–0.07)
Basophils Absolute: 0 10*3/uL (ref 0.0–0.1)
Basophils Relative: 0 %
Eosinophils Absolute: 0 10*3/uL (ref 0.0–0.5)
Eosinophils Relative: 0 %
HCT: 41.9 % (ref 36.0–46.0)
Hemoglobin: 13.9 g/dL (ref 12.0–15.0)
Immature Granulocytes: 1 %
Lymphocytes Relative: 11 %
Lymphs Abs: 1 10*3/uL (ref 0.7–4.0)
MCH: 29 pg (ref 26.0–34.0)
MCHC: 33.2 g/dL (ref 30.0–36.0)
MCV: 87.5 fL (ref 80.0–100.0)
Monocytes Absolute: 0.2 10*3/uL (ref 0.1–1.0)
Monocytes Relative: 2 %
Neutro Abs: 7.6 10*3/uL (ref 1.7–7.7)
Neutrophils Relative %: 86 %
Platelets: 189 10*3/uL (ref 150–400)
RBC: 4.79 MIL/uL (ref 3.87–5.11)
RDW: 13.6 % (ref 11.5–15.5)
WBC: 8.9 10*3/uL (ref 4.0–10.5)
nRBC: 0 % (ref 0.0–0.2)

## 2021-06-11 LAB — GLUCOSE, CAPILLARY
Glucose-Capillary: 231 mg/dL — ABNORMAL HIGH (ref 70–99)
Glucose-Capillary: 334 mg/dL — ABNORMAL HIGH (ref 70–99)
Glucose-Capillary: 283 mg/dL — ABNORMAL HIGH (ref 70–99)
Glucose-Capillary: 511 mg/dL (ref 70–99)
Glucose-Capillary: >600 mg/dL (ref 70–99)
Glucose-Capillary: 446 mg/dL — ABNORMAL HIGH (ref 70–99)
Glucose-Capillary: 68 mg/dL — ABNORMAL LOW (ref 70–99)
Glucose-Capillary: 176 mg/dL — ABNORMAL HIGH (ref 70–99)

## 2021-06-11 LAB — COMPREHENSIVE METABOLIC PANEL
ALT: 19 U/L (ref 0–44)
AST: 14 U/L — ABNORMAL LOW (ref 15–41)
Albumin: 3.3 g/dL — ABNORMAL LOW (ref 3.5–5.0)
Alkaline Phosphatase: 46 U/L (ref 38–126)
Anion gap: 11 (ref 5–15)
BUN: 33 mg/dL — ABNORMAL HIGH (ref 8–23)
CO2: 24 mmol/L (ref 22–32)
Calcium: 8.5 mg/dL — ABNORMAL LOW (ref 8.9–10.3)
Chloride: 101 mmol/L (ref 98–111)
Creatinine, Ser: 0.93 mg/dL (ref 0.44–1.00)
GFR, Estimated: >60 mL/min (ref >60)
Glucose, Bld: 363 mg/dL — ABNORMAL HIGH (ref 70–99)
Potassium: 3.7 mmol/L (ref 3.5–5.1)
Sodium: 136 mmol/L (ref 135–145)
Total Bilirubin: 0.8 mg/dL (ref 0.3–1.2)
Total Protein: 6.7 g/dL (ref 6.5–8.1)

## 2021-06-11 LAB — MAGNESIUM: Magnesium: 1.8 mg/dL (ref 1.7–2.4)

## 2021-06-11 LAB — PROCALCITONIN: Procalcitonin: <0.1 ng/mL

## 2021-06-11 MED ORDER — INSULIN GLARGINE-YFGN 100 UNIT/ML ~~LOC~~ SOLN
30.0000 [IU] | Freq: Every day | SUBCUTANEOUS | Status: DC
  Administered 2021-06-11: 30 [IU] via SUBCUTANEOUS
  Filled 2021-06-11 (×2): qty 0.3

## 2021-06-11 MED ORDER — INSULIN GLARGINE-YFGN 100 UNIT/ML ~~LOC~~ SOLN
30.0000 [IU] | Freq: Two times a day (BID) | SUBCUTANEOUS | Status: DC
  Administered 2021-06-11: 15 [IU] via SUBCUTANEOUS
  Filled 2021-06-11 (×4): qty 0.3

## 2021-06-11 MED ORDER — METHYLPREDNISOLONE SODIUM SUCC 125 MG IJ SOLR
60.0000 mg | Freq: Two times a day (BID) | INTRAMUSCULAR | Status: DC
  Administered 2021-06-11 – 2021-06-12 (×2): 60 mg via INTRAVENOUS
  Filled 2021-06-11 (×2): qty 2

## 2021-06-11 MED ORDER — INSULIN ASPART 100 UNIT/ML IJ SOLN
35.0000 [IU] | Freq: Once | INTRAMUSCULAR | Status: AC
  Administered 2021-06-11: 35 [IU] via SUBCUTANEOUS

## 2021-06-11 MED ORDER — INSULIN ASPART 100 UNIT/ML IJ SOLN
10.0000 [IU] | Freq: Three times a day (TID) | INTRAMUSCULAR | Status: DC
  Administered 2021-06-11 – 2021-06-13 (×6): 10 [IU] via SUBCUTANEOUS

## 2021-06-11 NOTE — Progress Notes (Signed)
Pt had o2 off spo2 80% on room air o2 back on at 4lpm cann advised patient to keep o2 on that oxygen levels are very low without the o2. ?

## 2021-06-11 NOTE — Progress Notes (Signed)
?PROGRESS NOTE ? ? ?Emily [name]  FGH:[id number] DOB: January 14, 1961 DOA: 06/10/2021 ?PCP: [name], MD  ? ?Chief Complaint  ?Patient presents with  ? Shortness of Breath  ? ?Level of care: Med-Surg ? ?Brief Admission History:  ?61 year old female with longstanding COPD, chronic nicotine dependence with daily smoking, cardiomyopathy with systolic heart failure on Entresto, anxiety disorder, history of right breast cancer status post chemotherapy and XRT, chronic bronchitis, chronic depression and anxiety, type 2 diabetes mellitus, osteoarthritis and dyslipidemia, diabetic neuropathy presented to the emergency department with ongoing shortness of breath that has been present for the past week but symptoms have been progressive.  She had a video conference with her PCP earlier in the week and was prescribed a course of azithromycin and steroids.  She said initially she felt better after taking the medication but after completing her symptoms returned and she is continuing to have shortness of breath wheezing cough productive sputum.  She reports that her symptoms initially started with a sinus infection but the postnasal drainage into her chest has caused a lot of her respiratory symptoms.  Her main complaint is dyspnea.  She has no chest pain.  She has no palpitations.  She denies fever and chills.  She reports no known sick contacts but reports that she tends to get very ill with changes in weather.  She was initially evaluated in the emergency department and found to have an oxygen requirement of 4 L/min.  She is not oxygen dependent at baseline.  The patient also reports that she had improvement of symptoms after nebulizers given in ED.  Unfortunately with slight ambulation getting up to go to the bathroom she became severely dyspneic and her pulse ox dropped down to the 80% range.  She is now requiring 2-4 L/min supplemental oxygen to maintain her pulse ox greater than 92%.  She was given IV steroids,  albuterol nebulizer treatments and admission was requested for further management. ?  ?Assessment and Plan: ?* COPD with acute exacerbation ([place]) ?-- Patient presents with severe symptomatic disease requiring inpatient management ?-- Patient has failed outpatient management ?-- IV steroids ordered every 12 hours ?-- Aggressive bronchodilator treatments ordered around-the-clock ?-- Check procalcitonin ?-- Empirically start IV ceftriaxone and doxycycline. Plan to stop ceftriaxone today.   ?-- Continue supplemental oxygenation with goal of weaning to room air when possible ? ?Acute respiratory failure with hypoxia ([place]) ?-- Patient has a new oxygen requirement and we are working to wean oxygen to room air as able ? ?-- Pt continues to rapidly desaturate down to 80% when off oxygen.   ? ?Chronic kidney disease, stage 3a ([place]) ?-- Currently stable continue to monitor closely, renal dose medications as appropriate ? ?Nicotine dependence with current use ?-- Counseled on cessation at bedside, nicotine patch ordered at the high-dose 21 mg for craving relief ? ?Hypertension associated with diabetes ([place]) ?-- Resume home blood pressure medications and follow ? ?Chronic recurrent pancreatitis (HCC) ?-- Currently stable no symptoms at this time. ? ?Chronic heart failure with normal ejection fraction (HCC) ?-- We are restarting all of her home heart failure medications ?-- Monitor intake output and daily weights ? ?COPD (chronic obstructive pulmonary disease) ([place]) ?-- Recommend ambulatory referral to [facility] pulmonology after discharge for outpatient surveillance ? ?History of breast cancer ?-- Outpatient follow-up for ongoing surveillance ? ?Carotid stenosis ?--stable ? ?Neuropathic pain ?-- Resume home medication and work on glycemic control ? ?Fatty liver disease, nonalcoholic ?-- Stable ? ?CAD (coronary artery disease) ?--  No active chest pain symptoms at this time, we are resuming all of her home cardiac  medications ? ?Insomnia ?-- Resume lorazepam as needed for symptoms ? ?Dyslipidemia associated with type 2 diabetes mellitus ([place]) ?-- Resume home atorvastatin daily in the evening ? ?GERD (gastroesophageal reflux disease) ?-- Protonix ordered for GI protection ? ?Type 2 diabetes mellitus with neurological complications (HCC) ?-- Anticipating some steroid-induced hyperglycemia ?-- Resume home basal insulin with prandial coverage and SSI coverage with frequent CBG monitoring ? ?DVT prophylaxis: enoxaparin ?Code Status: Full  ?Family Communication:  ?Disposition: Status is: Inpatient ?Remains inpatient appropriate because: IV antibiotics, IV steroids required  ?  ?Consultants:  ? ?Procedures:  ? ?Antimicrobials:  ?Cetriaxone 4/14-4/15 ?Doxycycline 4/14>>  ?Subjective: ?Pt reports that she is coughing with chest congestion and not sleeping well, very dyspneic when off oxygen, sats drop to 80% when oxygen came off.   ?Objective: ?Vitals:  ? 06/11/21 0316 06/11/21 0448 06/11/21 0735 06/11/21 0743  ?BP: (!) 131/59     ?Pulse: 88     ?Resp: 20     ?Temp: 97.8 ?F (36.6 ?C)     ?TempSrc: Oral     ?SpO2: 93%  90% 90%  ?Weight:  69.1 kg    ?Height:      ? ? ?Intake/Output Summary (Last 24 hours) at 06/11/2021 1213 ?Last data filed at 06/11/2021 4081 ?Gross per 24 hour  ?Intake 1140 ml  ?Output 2250 ml  ?Net -1110 ml  ? ?Filed Weights  ? 06/10/21 1020 06/10/21 1602 06/11/21 0448  ?Weight: 68 kg 68.2 kg 69.1 kg  ? ?Examination: ? ?General exam: Appears calm and comfortable  ?Respiratory system: Clear to auscultation. Respiratory effort normal. ?Cardiovascular system: normal S1 & S2 heard. No JVD, murmurs, rubs, gallops or clicks. No pedal edema. ?Gastrointestinal system: Abdomen is nondistended, soft and nontender. No organomegaly or masses felt. Normal bowel sounds heard. ?Central nervous system: Alert and oriented. No focal neurological deficits. ?Extremities: Symmetric 5 x 5 power. ?Skin: No rashes, lesions or  ulcers. ?Psychiatry: Judgement and insight appear normal. Mood & affect appropriate.  ? ?Data Reviewed: I have personally reviewed following labs and imaging studies ? ?CBC: ?Recent Labs  ?Lab 06/10/21 ?1029 06/11/21 ?4481  ?WBC 11.4* 8.9  ?NEUTROABS 7.1 7.6  ?HGB 14.6 13.9  ?HCT 44.3 41.9  ?MCV 86.5 87.5  ?PLT 186 189  ? ? ?Basic Metabolic Panel: ?Recent Labs  ?Lab 06/10/21 ?1029 06/11/21 ?8563  ?NA 138 136  ?K 3.5 3.7  ?CL 102 101  ?CO2 27 24  ?GLUCOSE 264* 363*  ?BUN 25* 33*  ?CREATININE 0.98 0.93  ?CALCIUM 8.4* 8.5*  ?MG  --  1.8  ? ? ?CBG: ?Recent Labs  ?Lab 06/10/21 ?1622 06/10/21 ?2044 06/11/21 ?1497 06/11/21 ?0751 06/11/21 ?1133  ?GLUCAP 341* 272* 231* 334* 283*  ? ? ?Recent Results (from the past 240 hour(s))  ?Resp Panel by RT-PCR (Flu A&B, Covid) Nasopharyngeal Swab     Status: None  ? Collection Time: 06/10/21  2:00 PM  ? Specimen: Nasopharyngeal Swab; Nasopharyngeal(NP) swabs in vial transport medium  ?Result Value Ref Range Status  ? SARS Coronavirus 2 by RT PCR NEGATIVE NEGATIVE Final  ?  Comment: (NOTE) ?SARS-CoV-2 target nucleic acids are NOT DETECTED. ? ?The SARS-CoV-2 RNA is generally detectable in upper respiratory ?specimens during the acute phase of infection. The lowest ?concentration of SARS-CoV-2 viral copies this assay can detect is ?138 copies/mL. A negative result does not preclude SARS-Cov-2 ?infection and should not be used as  the sole basis for treatment or ?other patient management decisions. A negative result may occur with  ?improper specimen collection/handling, submission of specimen other ?than nasopharyngeal swab, presence of viral mutation(s) within the ?areas targeted by this assay, and inadequate number of viral ?copies(<138 copies/mL). A negative result must be combined with ?clinical observations, patient history, and epidemiological ?information. The expected result is Negative. ? ?Fact Sheet for Patients:  ?EntrepreneurPulse.com.au ? ?Fact Sheet for  Healthcare Providers:  ?IncredibleEmployment.be ? ?This test is no t yet approved or cleared by the [place] FDA and  ?has been authorized for detection and/or diagnosis of SARS-CoV-2 by ?FDA under an Emergency Use Authorizat

## 2021-06-11 NOTE — Assessment & Plan Note (Signed)
stable °

## 2021-06-12 DIAGNOSIS — J441 Chronic obstructive pulmonary disease with (acute) exacerbation: Secondary | ICD-10-CM | POA: Diagnosis not present

## 2021-06-12 DIAGNOSIS — N1831 Chronic kidney disease, stage 3a: Secondary | ICD-10-CM | POA: Diagnosis not present

## 2021-06-12 DIAGNOSIS — I5032 Chronic diastolic (congestive) heart failure: Secondary | ICD-10-CM | POA: Diagnosis not present

## 2021-06-12 DIAGNOSIS — I2510 Atherosclerotic heart disease of native coronary artery without angina pectoris: Secondary | ICD-10-CM | POA: Diagnosis not present

## 2021-06-12 LAB — RESPIRATORY PANEL BY PCR

## 2021-06-12 LAB — GLUCOSE, CAPILLARY
Glucose-Capillary: 226 mg/dL — ABNORMAL HIGH (ref 70–99)
Glucose-Capillary: 266 mg/dL — ABNORMAL HIGH (ref 70–99)
Glucose-Capillary: 385 mg/dL — ABNORMAL HIGH (ref 70–99)
Glucose-Capillary: 327 mg/dL — ABNORMAL HIGH (ref 70–99)
Glucose-Capillary: 305 mg/dL — ABNORMAL HIGH (ref 70–99)

## 2021-06-12 MED ORDER — INSULIN ASPART 100 UNIT/ML IJ SOLN
0.0000 [IU] | Freq: Every day | INTRAMUSCULAR | Status: DC
  Administered 2021-06-12: 4 [IU] via SUBCUTANEOUS

## 2021-06-12 MED ORDER — IPRATROPIUM-ALBUTEROL 0.5-2.5 (3) MG/3ML IN SOLN
3.0000 mL | Freq: Three times a day (TID) | RESPIRATORY_TRACT | Status: DC
Start: 2021-06-13 — End: 2021-06-13
  Administered 2021-06-13: 3 mL via RESPIRATORY_TRACT
  Filled 2021-06-12: qty 3

## 2021-06-12 MED ORDER — INSULIN ASPART 100 UNIT/ML IJ SOLN
0.0000 [IU] | Freq: Three times a day (TID) | INTRAMUSCULAR | Status: DC
  Administered 2021-06-12: 15 [IU] via SUBCUTANEOUS
  Administered 2021-06-13: 7 [IU] via SUBCUTANEOUS

## 2021-06-12 MED ORDER — ENSURE ENLIVE PO LIQD
237.0000 mL | Freq: Two times a day (BID) | ORAL | Status: DC
  Administered 2021-06-12 – 2021-06-13 (×3): 237 mL via ORAL

## 2021-06-12 MED ORDER — METHYLPREDNISOLONE SODIUM SUCC 40 MG IJ SOLR
40.0000 mg | Freq: Two times a day (BID) | INTRAMUSCULAR | Status: DC
  Administered 2021-06-12 – 2021-06-13 (×2): 40 mg via INTRAVENOUS
  Filled 2021-06-12 (×2): qty 1

## 2021-06-12 MED ORDER — INSULIN GLARGINE-YFGN 100 UNIT/ML ~~LOC~~ SOLN
25.0000 [IU] | Freq: Every day | SUBCUTANEOUS | Status: DC
Start: 2021-06-13 — End: 2021-06-13
  Administered 2021-06-13: 25 [IU] via SUBCUTANEOUS
  Filled 2021-06-12 (×2): qty 0.25

## 2021-06-12 MED ORDER — INSULIN GLARGINE-YFGN 100 UNIT/ML ~~LOC~~ SOLN: 25.0000 [IU] | Freq: Every day | SUBCUTANEOUS | Status: DC

## 2021-06-12 MED ORDER — ALBUTEROL SULFATE (2.5 MG/3ML) 0.083% IN NEBU: 2.5000 mg | INHALATION_SOLUTION | Freq: Four times a day (QID) | RESPIRATORY_TRACT | Status: DC | PRN

## 2021-06-12 NOTE — Progress Notes (Signed)
?PROGRESS NOTE ? ? ?Emily [name]  RKY:[id number] DOB: 03/19/1960 DOA: 06/10/2021 ?PCP: [name], MD  ? ?Chief Complaint  ?Patient presents with  ? Shortness of Breath  ? ?Level of care: Med-Surg ? ?Brief Admission History:  ?61 year old female with longstanding COPD, chronic nicotine dependence with daily smoking, cardiomyopathy with systolic heart failure on Entresto, anxiety disorder, history of right breast cancer status post chemotherapy and XRT, chronic bronchitis, chronic depression and anxiety, type 2 diabetes mellitus, osteoarthritis and dyslipidemia, diabetic neuropathy presented to the emergency department with ongoing shortness of breath that has been present for the past week but symptoms have been progressive.  She had a video conference with her PCP earlier in the week and was prescribed a course of azithromycin and steroids.  She said initially she felt better after taking the medication but after completing her symptoms returned and she is continuing to have shortness of breath wheezing cough productive sputum.  She reports that her symptoms initially started with a sinus infection but the postnasal drainage into her chest has caused a lot of her respiratory symptoms.  Her main complaint is dyspnea.  She has no chest pain.  She has no palpitations.  She denies fever and chills.  She reports no known sick contacts but reports that she tends to get very ill with changes in weather.  She was initially evaluated in the emergency department and found to have an oxygen requirement of 4 L/min.  She is not oxygen dependent at baseline.  The patient also reports that she had improvement of symptoms after nebulizers given in ED.  Unfortunately with slight ambulation getting up to go to the bathroom she became severely dyspneic and her pulse ox dropped down to the 80% range.  She is now requiring 2-4 L/min supplemental oxygen to maintain her pulse ox greater than 92%.  She was given IV steroids,  albuterol nebulizer treatments and admission was requested for further management. ?  ?Assessment and Plan: ?* COPD with acute exacerbation ([place]) ?-- Patient presents with severe symptomatic disease requiring inpatient management ?-- Patient has failed outpatient management ?-- IV steroids ordered every 12 hours ?-- Aggressive bronchodilator treatments ordered around-the-clock ?-- Check procalcitonin ?-- continue doxycycline.  Completed ceftriaxone.   ?-- Continue supplemental oxygenation with goal of weaning to room air when possible ? ?Acute respiratory failure with hypoxia ([place]) ?-- Patient has a new oxygen requirement and we are working to wean oxygen to room air as able ? ?-- Pt continues to rapidly desaturate down to 80% when off oxygen.   ? ?Chronic kidney disease, stage 3a ([place]) ?-- Currently stable continue to monitor closely, renal dose medications as appropriate ? ?Nicotine dependence with current use ?-- Counseled on cessation at bedside, nicotine patch ordered at the high-dose 21 mg for craving relief ? ?Hypertension associated with diabetes ([place]) ?-- Resume home blood pressure medications and follow ? ?Chronic recurrent pancreatitis (HCC) ?-- Currently stable no symptoms at this time. ? ?Chronic heart failure with normal ejection fraction (HCC) ?-- We restarted all of her home heart failure medications ?-- Monitor intake output and daily weights ? ?COPD (chronic obstructive pulmonary disease) ([place]) ?-- Recommend ambulatory referral to [facility] pulmonology after discharge for outpatient surveillance ? ?History of breast cancer ?-- Outpatient follow-up for ongoing surveillance ? ?Carotid stenosis ?--stable ? ?Neuropathic pain ?-- Resume home medication and work on glycemic control ? ?Fatty liver disease, nonalcoholic ?-- Stable ? ?CAD (coronary artery disease) ?-- No active chest pain symptoms at  this time, we are resuming all of her home cardiac medications ? ?Insomnia ?-- Resume lorazepam as needed for  symptoms ? ?Dyslipidemia associated with type 2 diabetes mellitus ([place]) ?-- Resume home atorvastatin daily in the evening ? ?GERD (gastroesophageal reflux disease) ?-- Protonix ordered for GI protection ? ?Type 2 diabetes mellitus with neurological complications ([place]) ?-- Pt experiencing some steroid-induced hyperglycemia and had outside food brought in ?-- continue basal insulin with prandial coverage and SSI coverage with frequent CBG monitoring ?-- we are slowly reducing her IV steroid.  Resistant SSI scale ordered.   ? ?DVT prophylaxis: enoxaparin ?Code Status: Full  ?Family Communication:  ?Disposition: Status is: Inpatient ?Remains inpatient appropriate because: IV antibiotics, IV steroids required  ?  ?Consultants:  ? ?Procedures:  ? ?Antimicrobials:  ?Cetriaxone 4/14-4/15 ?Doxycycline 4/14>>  ?Subjective: ?Pt reports that she was able to sleep better, still with productive cough and chest congestion and requiring 4L/min [place].  She reports that she is not quite back to her baseline at this time.     ?Objective: ?Vitals:  ? 06/12/21 0118 06/12/21 0411 06/12/21 9233 06/12/21 0076  ?BP:  136/72    ?Pulse:  87    ?Resp:  19    ?Temp:  98 ?F (36.7 ?C)    ?TempSrc:      ?SpO2: 94% 95% 94% 94%  ?Weight:      ?Height:      ? ? ?Intake/Output Summary (Last 24 hours) at 06/12/2021 1526 ?Last data filed at 06/12/2021 1300 ?Gross per 24 hour  ?Intake 720 ml  ?Output 1200 ml  ?Net -480 ml  ? ?Filed Weights  ? 06/10/21 1020 06/10/21 1602 06/11/21 0448  ?Weight: 68 kg 68.2 kg 69.1 kg  ? ?Examination: ? ?General exam: Appears calm and comfortable  ?Respiratory system: Clear to auscultation. Respiratory effort normal. ?Cardiovascular system: normal S1 & S2 heard. No JVD, murmurs, rubs, gallops or clicks. No pedal edema. ?Gastrointestinal system: Abdomen is nondistended, soft and nontender. No organomegaly or masses felt. Normal bowel sounds heard. ?Central nervous system: Alert and oriented. No focal neurological  deficits. ?Extremities: Symmetric 5 x 5 power. ?Skin: No rashes, lesions or ulcers. ?Psychiatry: Judgement and insight appear normal. Mood & affect appropriate.  ? ?Data Reviewed: I have personally reviewed following labs and imaging studies ? ?CBC: ?Recent Labs  ?Lab 06/10/21 ?1029 06/11/21 ?2263  ?WBC 11.4* 8.9  ?NEUTROABS 7.1 7.6  ?HGB 14.6 13.9  ?HCT 44.3 41.9  ?MCV 86.5 87.5  ?PLT 186 189  ? ? ?Basic Metabolic Panel: ?Recent Labs  ?Lab 06/10/21 ?1029 06/11/21 ?3354  ?NA 138 136  ?K 3.5 3.7  ?CL 102 101  ?CO2 27 24  ?GLUCOSE 264* 363*  ?BUN 25* 33*  ?CREATININE 0.98 0.93  ?CALCIUM 8.4* 8.5*  ?MG  --  1.8  ? ? ?CBG: ?Recent Labs  ?Lab 06/11/21 ?2110 06/11/21 ?2210 06/12/21 ?0418 06/12/21 ?5625 06/12/21 ?1201  ?GLUCAP 68* 176* 226* 266* 385*  ? ? ?Recent Results (from the past 240 hour(s))  ?Resp Panel by RT-PCR (Flu A&B, Covid) Nasopharyngeal Swab     Status: None  ? Collection Time: 06/10/21  2:00 PM  ? Specimen: Nasopharyngeal Swab; Nasopharyngeal(NP) swabs in vial transport medium  ?Result Value Ref Range Status  ? SARS Coronavirus 2 by RT PCR NEGATIVE NEGATIVE Final  ?  Comment: (NOTE) ?SARS-CoV-2 target nucleic acids are NOT DETECTED. ? ?The SARS-CoV-2 RNA is generally detectable in upper respiratory ?specimens during the acute phase of infection. The lowest ?concentration of  SARS-CoV-2 viral copies this assay can detect is ?138 copies/mL. A negative result does not preclude SARS-Cov-2 ?infection and should not be used as the sole basis for treatment or ?other patient management decisions. A negative result may occur with  ?improper specimen collection/handling, submission of specimen other ?than nasopharyngeal swab, presence of viral mutation(s) within the ?areas targeted by this assay, and inadequate number of viral ?copies(<138 copies/mL). A negative result must be combined with ?clinical observations, patient history, and epidemiological ?information. The expected result is Negative. ? ?Fact Sheet for  Patients:  ?EntrepreneurPulse.com.au ? ?Fact Sheet for Healthcare Providers:  ?IncredibleEmployment.be ? ?This test is no t yet approved or cleared by the [place] and  ?has been aut

## 2021-06-12 NOTE — Progress Notes (Addendum)
Hypoglycemic Event ? ?CBG: 68 ? ?Treatment: 8 ounces of juice provided ? ?Symptoms: None. Patient AOx4 without pale, clammy skin.  Patient does not report feeling like her CBG is low.   ? ?Follow-up CBG: Time:2210 CBG Result:176 ? ? ?Comments/MD notified:Dr. Zierle-Ghosh notified at 2130.  I did place an order for a BMP STAT, but gave juice before they arrived to draw the labs.  BMP will be cancelled.   ? ? ?Because of hypoglycemia, order to give 15 units of Semglee instead of ordered 30 units.  ? ?Emily [name] ? ? ?

## 2021-06-12 NOTE — TOC Initial Note (Signed)
Transition of Care (TOC) - Initial/Assessment Note  ? ? ?Patient Details  ?Name: Emily [name] ?MRN: [id number] ?Date of Birth: 11-Oct-1960 ? ?Transition of Care (TOC) CM/SW Contact:    ?Iona Beard, LCSWA ?Phone Number: ?06/12/2021, 11:10 AM ? ?Clinical Narrative:                 ?TOC noted that pt is high risk for readmission. CSW spoke with pt to complete assessment. Pt states that she lives alone. Pt is mostly independent in completing her ADLs. Pt is able to drive if needed, though she has not been driving much lately. Pt has not had HH in the past. Pt has a cane to use when ambulating if needed. TOC to follow for needs.  ? ?Expected Discharge Plan: [place] ?Barriers to Discharge: Continued Medical Work up ? ? ?Patient Goals and CMS Choice ?Patient states their goals for this hospitalization and ongoing recovery are:: return home ?CMS Medicare.gov Compare Post Acute Care list provided to:: Patient ?Choice offered to / list presented to : Patient ? ?Expected Discharge Plan and Services ?Expected Discharge Plan: [place] ?In-house Referral: Clinical Social Work ?  ?Post Acute Care Choice: Home Health ?Living arrangements for the past 2 months: Apartment ?                ?  ?  ?  ?  ?  ?  ?  ?  ?  ?  ? ?Prior Living Arrangements/Services ?Living arrangements for the past 2 months: Apartment ?Lives with:: Self ?Patient language and need for interpreter reviewed:: Yes ?Do you feel safe going back to the place where you live?: Yes      ?Need for Family Participation in Patient Care: Yes (Comment) ?Care giver support system in place?: Yes (comment) ?Current home services: DME (cane) ?Criminal Activity/Legal Involvement Pertinent to Current Situation/Hospitalization: No - Comment as needed ? ?Activities of Daily Living ?Home Assistive Devices/Equipment: Nebulizer, Cane (specify quad or straight), Grab bars in shower, Blood pressure cuff ?ADL Screening (condition at time of  admission) ?Patient's cognitive ability adequate to safely complete daily activities?: Yes ?Is the patient deaf or have difficulty hearing?: No ?Does the patient have difficulty seeing, even when wearing glasses/contacts?: No ?Does the patient have difficulty concentrating, remembering, or making decisions?: No ?Patient able to express need for assistance with ADLs?: Yes ?Does the patient have difficulty dressing or bathing?: No ?Independently performs ADLs?: Yes (appropriate for developmental age) ?Does the patient have difficulty walking or climbing stairs?: Yes ?Weakness of Legs: Both ?Weakness of Arms/Hands: Both ? ?Permission Sought/Granted ?  ?  ?   ?   ?   ?   ? ?Emotional Assessment ?Appearance:: Appears stated age ?Attitude/Demeanor/Rapport: Engaged ?Affect (typically observed): Accepting ?Orientation: : Oriented to Self, Oriented to Place, Oriented to Situation, Oriented to  Time ?Alcohol / Substance Use: Not Applicable ?Psych Involvement: No (comment) ? ?Admission diagnosis:  Acute respiratory failure with hypoxia ([place]) [J96.01] ?COPD with acute exacerbation ([place]) [J44.1] ?Patient Active Problem List  ? Diagnosis Date Noted  ? COPD with acute exacerbation ([place]) 06/10/2021  ? Acute respiratory failure with hypoxia ([place]) 06/10/2021  ? Thoracic compression fracture ([place]) 01/12/2021  ? Chronic kidney disease, stage 3a ([place]) 10/25/2020  ? Degeneration of lumbar intervertebral disc 11/28/2019  ? Cervical radiculopathy 03/20/2019  ? B12 deficiency 03/20/2019  ? Nicotine dependence with current use 09/13/2018  ? Dysphagia 09/13/2018  ? Moderate single current episode of major  depressive disorder ([place]) 08/20/2017  ? Low back pain 08/20/2017  ? Chronic recurrent pancreatitis ([place]) 08/20/2017  ? Hypertension associated with diabetes ([place]) 08/20/2017  ? Complex regional pain syndrome type 1 of lower extremity 04/03/2017  ? Chronic heart failure with normal ejection fraction ([place]) 05/03/2016  ? Osteoporosis 03/17/2016   ? COPD (chronic obstructive pulmonary disease) ([place]) 01/10/2016  ? Sarcoma ([place]) 10/22/2015  ? Palpitations 09/01/2015  ? DNR (do not resuscitate) discussion   ? History of breast cancer 10/02/2014  ? Sciatica of left side 09/30/2014  ? Vitamin D deficiency 02/24/2014  ? Pruritus 02/24/2014  ? Carotid stenosis 11/12/2013  ? Raynaud's phenomenon 10/27/2013  ? Neuropathic pain 04/08/2013  ? Fatty liver disease, nonalcoholic 44/62/8638  ? CAD (coronary artery disease) 11/12/2012  ? GERD (gastroesophageal reflux disease)   ? Dyslipidemia associated with type 2 diabetes mellitus ([place])   ? Insomnia   ? Type 2 diabetes mellitus with neurological complications ([place]) 17/71/1657  ? ?PCP:  [name], MD ?Pharmacy:   ?[facility] [address] ?[place] [address] ?Phone: [phone] Fax: [phone] ? ?[facility] - [address], [address] ?[address] ?[place] [address] ?Phone: [phone] Fax: [phone] ? ?[id number] [place], [place]?Phone: [phone]: [phone]? ? ? ? ?Social Determinants of Health (SDOH) Interventions ?  ? ?Readmission Risk Interventions ? ?  06/12/2021  ? 11:08 AM  ?Readmission Risk Prevention Plan  ?Transportation Screening Complete  ?[place] [facility] [facility] Consult Complete  ?Social Work Consult for [place]anning/Counseling Complete  ?Palliative Care Screening Not Applicable  ?Medication Review (Press photographer Complete  ? ? ? ?

## 2021-06-12 NOTE — Progress Notes (Signed)
Initial Nutrition Assessment ? ?DOCUMENTATION CODES:  ? ?Not applicable ? ?INTERVENTION:  ?- Liberalize diet from a heart healthy/carb modified to a carb modified diet to provide widest variety of menu options to enhance nutritional adequacy ? ?- Ensure Enlive po BID, each supplement provides 350 kcal and 20 grams of protein. ? ?NUTRITION DIAGNOSIS:  ? ?Increased nutrient needs related to acute illness as evidenced by estimated needs. ? ?GOAL:  ? ?Patient will meet greater than or equal to 90% of their needs ? ?MONITOR:  ? ?PO intake, Supplement acceptance, Diet advancement, Labs, Weight trends ? ?REASON FOR ASSESSMENT:  ? ?Malnutrition Screening Tool ?  ? ?ASSESSMENT:  ? ?Pt admitted from home with SOB secondary to acute COPD exacerbation. PMH significant for longstanding COPD, chronic nicotine dependence with daily smoking, cardiomyopathy with systolic heart failure, h/o R breast cancer s/p chemotherapy and XRT, chronic bronchitis, chronic depression and anxiety, T2DM, osteoarthritis and dyslipidemia and diabetic neuropathy. ? ?Unsuccessful attempt to reach pt x2 via phone call to room as RD working remotely today.  ? ?Meal completions: ?04/14: 50%-dinner ?04/15: 50%-breakfast, 75%-lunch ? ?Reviewed weight history. Noted fluctuations between 67.1-71.6 kg within the last year, remaining stable.  ? ?Medications: doxycycline, SSI, semglee 25 units daily, solu-medrol ? ?Labs: BUN 33, AST 14, HgbA1c 10.1%, CBG's 68- >600 x24 hours ? ?NUTRITION - FOCUSED PHYSICAL EXAM: ?RD working remotely. Deferred to follow up. ? ?Diet Order:   ?Diet Order   ? ?       ?  Diet Carb Modified Fluid consistency: Thin; Room service appropriate? Yes  Diet effective now       ?  ? ?  ?  ? ?  ? ? ?EDUCATION NEEDS:  ? ?No education needs have been identified at this time ? ?Skin:  Skin Assessment: Reviewed RN Assessment ? ?Last BM:  4/14 ? ?Height:  ? ?Ht Readings from Last 1 Encounters:  ?06/10/21 '5\' 5"'$  (1.651 m)  ? ? ?Weight:  ? ?Wt Readings  from Last 1 Encounters:  ?06/11/21 69.1 kg  ? ? ?BMI:  Body mass index is 25.34 kg/m?. ? ?Estimated Nutritional Needs:  ? ?Kcal:  1700-1900 ? ?Protein:  85-100g ? ?Fluid:  >/=1.7L ? ?Clayborne Dana, RDN, LDN ?Clinical Nutrition ?

## 2021-06-13 ENCOUNTER — Encounter: Payer: Self-pay | Admitting: Family Medicine

## 2021-06-13 ENCOUNTER — Inpatient Hospital Stay (HOSPITAL_COMMUNITY): Payer: Medicare Other

## 2021-06-13 DIAGNOSIS — J9601 Acute respiratory failure with hypoxia: Secondary | ICD-10-CM | POA: Diagnosis not present

## 2021-06-13 DIAGNOSIS — J441 Chronic obstructive pulmonary disease with (acute) exacerbation: Secondary | ICD-10-CM | POA: Diagnosis not present

## 2021-06-13 DIAGNOSIS — I6523 Occlusion and stenosis of bilateral carotid arteries: Secondary | ICD-10-CM

## 2021-06-13 DIAGNOSIS — I5032 Chronic diastolic (congestive) heart failure: Secondary | ICD-10-CM | POA: Diagnosis not present

## 2021-06-13 LAB — GLUCOSE, CAPILLARY
Glucose-Capillary: 405 mg/dL — ABNORMAL HIGH (ref 70–99)
Glucose-Capillary: 213 mg/dL — ABNORMAL HIGH (ref 70–99)
Glucose-Capillary: 388 mg/dL — ABNORMAL HIGH (ref 70–99)

## 2021-06-13 MED ORDER — PREDNISONE 10 MG PO TABS: ORAL_TABLET | ORAL | 0 refills | Status: DC

## 2021-06-13 MED ORDER — DEXTROMETHORPHAN POLISTIREX ER 30 MG/5ML PO SUER: 30.0000 mg | Freq: Two times a day (BID) | ORAL | 0 refills | Status: DC | PRN

## 2021-06-13 MED ORDER — INSULIN ASPART 100 UNIT/ML IJ SOLN
10.0000 [IU] | Freq: Once | INTRAMUSCULAR | Status: AC
  Administered 2021-06-13: 10 [IU] via SUBCUTANEOUS

## 2021-06-13 MED ORDER — GUAIFENESIN ER 600 MG PO TB12: 1200.0000 mg | ORAL_TABLET | Freq: Two times a day (BID) | ORAL | 0 refills | Status: AC

## 2021-06-13 MED ORDER — GUAIFENESIN ER 600 MG PO TB12
1200.0000 mg | ORAL_TABLET | Freq: Two times a day (BID) | ORAL | Status: DC
  Administered 2021-06-13: 1200 mg via ORAL
  Filled 2021-06-13: qty 2

## 2021-06-13 MED ORDER — IPRATROPIUM-ALBUTEROL 0.5-2.5 (3) MG/3ML IN SOLN: 3.0000 mL | RESPIRATORY_TRACT | 1 refills | Status: DC | PRN

## 2021-06-13 MED ORDER — IPRATROPIUM-ALBUTEROL 0.5-2.5 (3) MG/3ML IN SOLN: 3.0000 mL | Freq: Two times a day (BID) | RESPIRATORY_TRACT | Status: DC

## 2021-06-13 MED ORDER — DOXYCYCLINE HYCLATE 100 MG PO TABS: 100.0000 mg | ORAL_TABLET | Freq: Two times a day (BID) | ORAL | 0 refills | Status: DC

## 2021-06-13 MED ORDER — OXYCODONE HCL 5 MG PO TABS: 5.0000 mg | ORAL_TABLET | Freq: Four times a day (QID) | ORAL | 0 refills | Status: DC | PRN

## 2021-06-13 MED ORDER — LORAZEPAM 2 MG PO TABS: ORAL_TABLET | ORAL | 0 refills | Status: DC

## 2021-06-13 NOTE — TOC Transition Note (Signed)
Transition of Care (TOC) - CM/SW Discharge Note ? ? ?Patient Details  ?Name: Emily [name] ?MRN: [id number] ?Date of Birth: [birth year] ? ?Transition of Care (TOC) CM/SW Contact:  ?Shade Flood, LCSW ?Phone Number: ?06/13/2021, 10:38 AM ? ? ?Clinical Narrative:    ? ?Per MD, pt stable for dc home today. MD stated pt on room air at this time. No TOC needs identified for dc. ? ?Final next level of care: Home/Self Care ?Barriers to Discharge: Barriers Resolved ? ? ?Patient Goals and CMS Choice ?Patient states their goals for this hospitalization and ongoing recovery are:: return home ?CMS Medicare.gov Compare Post Acute Care list provided to:: Patient ?Choice offered to / list presented to : Patient ? ?Discharge Placement ?  ?           ?  ?  ?  ?  ? ?Discharge Plan and Services ?In-house Referral: Clinical Social Work ?  ?Post Acute Care Choice: Home Health          ?  ?  ?  ?  ?  ?  ?  ?  ?  ?  ? ?Social Determinants of Health (SDOH) Interventions ?  ? ? ?Readmission Risk Interventions ? ?  06/12/2021  ? 11:08 AM  ?Readmission Risk Prevention Plan  ?Transportation Screening Complete  ?[place] or Home Care Consult Complete  ?Social Work Consult for [place] Planning/Counseling Complete  ?Palliative Care Screening Not Applicable  ?Medication Review Press photographer) Complete  ? ? ? ? ? ?

## 2021-06-13 NOTE — Progress Notes (Addendum)
Inpatient Diabetes Program Recommendations ? ?AACE/ADA: New Consensus Statement on Inpatient Glycemic Control  ? ?Target Ranges:  Prepandial:   less than 140 mg/dL ?     Peak postprandial:   less than 180 mg/dL (1-2 hours) ?     Critically ill patients:  140 - 180 mg/dL  ? ? Latest Reference Range & Units 06/13/21 03:05 06/13/21 07:29  ?Glucose-Capillary 70 - 99 mg/dL 405 (H) 213 (H)  ? ? Latest Reference Range & Units 06/12/21 07:48 06/12/21 12:01 06/12/21 16:21 06/12/21 21:00  ?Glucose-Capillary 70 - 99 mg/dL 266 (H) 385 (H) 327 (H) 305 (H)  ? ? Latest Reference Range & Units 11/05/20 13:41 06/10/21 14:52  ?Hemoglobin A1C 4.8 - 5.6 % 10.2 (H) 10.1 (H)  ? ?Review of Glycemic Control ? ?Diabetes history: DM2 ?Outpatient Diabetes medications: Jardiance 25 mg QAM, Tresiba 20 units daily, Metformin XR 500 mg BID ?Current orders for Inpatient glycemic control: Semglee 25 units daily, Novolog 0-20 units TID with meals, Novolog 0-5 units QHS, Novolog 10 units TID with meals; Solumedrol 40 mg Q12H ? ?Inpatient Diabetes Program Recommendations:   ? ?Insulin: Glucose 405 mg/d at 3:05 this am. Noted Semglee 25 units ordered on 06/12/21 to start today.  ? ?HbgA1C: A1C 10.1% on 06/10/21 indicating an average glucose of 243 mg/dl over the past 2-3 months. Prior A1C 10.2% on 11/05/20. ? ?NOTE: In reviewing the chart, noted patient had video visit with PCP on 11/22/20 and per notes patient was already taking Jardiance and Metformin for DM control and was started on Tresiba 10 units daily since A1C ws 10.2%. Patient had video visit with PCP on 06/06/21 for COPDE and prescribed azithromycin and prednisone 50 mg x5 days.  ? ?Addendum 06/13/21'@12'$ :10-Spoke with patient over the phone about diabetes and home regimen for diabetes control. Patient reports being followed by PCP for diabetes management and currently taking Jardiance 25 mg QAM, Tresiba 20 units daily, and Metformin XR 500 mg BID as an outpatient for diabetes control. Patient  reports that over the past 6 months, her diabetes medications have changed and she was started on Tresiba several months ago and she is now up to 20 units daily.  Patient reports that she had been calling PCP for adjustments with Tyler Aas once she started taking it but once she was up to 20 units daily she has not been calling PCP for adjustments. Patient reports that her glucose is usually in the 200's mg/dl.    Discussed A1C results (10.1% on 06/10/21) and explained that current A1C indicates an average glucose of 243 mg/dl over the past 2-3 months. Discussed glucose and A1C goals. Discussed importance of checking CBGs and maintaining good CBG control to prevent long-term and short-term complications. Patient does not have any follow up visits already scheduled with PCP. Asked patient to follow up with PCP regarding DM control as she likely needs additional adjustments with DM medications to get DM under better control.  Patient verbalized understanding of information discussed and reports no further questions at this time related to diabetes. ? ? ?Thanks, ?Barnie Alderman, RN, MSN, CDE ?Diabetes Coordinator ?Inpatient Diabetes Program ?[phone] (Team Pager from 8am to 5pm) ? ? ?

## 2021-06-13 NOTE — TOC Transition Note (Signed)
Transition of Care (TOC) - CM/SW Discharge Note ? ? ?Patient Details  ?Name: Emily [name] ?MRN: [id number] ?Date of Birth: Jul 13, 1960 ? ?Transition of Care (TOC) CM/SW Contact:  ?Shade Flood, LCSW ?Phone Number: ?06/13/2021, 12:25 PM ? ? ?Clinical Narrative:    ? ?Pt stable for dc. MD has ordered a nebulizer machine for pt. Spoke with pt to review CMS provider options. Referred to [facility] and pt requested that the machine be sent to her home. No other DC needs identified. ? ?Final next level of care: Home/Self Care ?Barriers to Discharge: Barriers Resolved ? ? ?Patient Goals and CMS Choice ?Patient states their goals for this hospitalization and ongoing recovery are:: return home ?CMS Medicare.gov Compare Post Acute Care list provided to:: Patient ?Choice offered to / list presented to : Patient ? ?Discharge Placement ?  ?           ?  ?  ?  ?  ? ?Discharge Plan and Services ?In-house Referral: Clinical Social Work ?  ?Post Acute Care Choice: Home Health          ?DME Arranged: Nebulizer machine ?DME Agency: [facility] ?Date DME Agency Contacted: 06/13/21 ?  ?Representative spoke with at DME Agency: Caryl Pina ?  ?  ?  ?  ?  ? ?Social Determinants of Health (SDOH) Interventions ?  ? ? ?Readmission Risk Interventions ? ?  06/12/2021  ? 11:08 AM  ?Readmission Risk Prevention Plan  ?Transportation Screening Complete  ?[place] or Home Care Consult Complete  ?Social Work Consult for [place] Planning/Counseling Complete  ?Palliative Care Screening Not Applicable  ?Medication Review Press photographer) Complete  ? ? ? ? ? ?

## 2021-06-13 NOTE — Care Management Important Message (Signed)
Important Message ? ?Patient Details  ?Name: Emily [name] ?MRN: [id number] ?Date of Birth: 1960-03-13 ? ? ?Medicare Important Message Given:  Yes ? ? ? ? ?Tommy Medal ?06/13/2021, 12:05 PM ?

## 2021-06-13 NOTE — Plan of Care (Signed)

## 2021-06-13 NOTE — Discharge Instructions (Signed)
IMPORTANT INFORMATION: PAY CLOSE ATTENTION  ? ?PHYSICIAN DISCHARGE INSTRUCTIONS ? ?Follow with Primary care provider  [name], MD  and other consultants as instructed by your Hospitalist Physician ? ?SEEK MEDICAL CARE OR RETURN TO EMERGENCY ROOM IF SYMPTOMS COME BACK, WORSEN OR NEW PROBLEM DEVELOPS  ? ?Please note: ?You were cared for by a hospitalist during your hospital stay. Every effort will be made to forward records to your primary care provider.  You can request that your primary care provider send for your hospital records if they have not received them.  Once you are discharged, your primary care physician will handle any further medical issues. Please note that NO REFILLS for any discharge medications will be authorized once you are discharged, as it is imperative that you return to your primary care physician (or establish a relationship with a primary care physician if you do not have one) for your post hospital discharge needs so that they can reassess your need for medications and monitor your lab values. ? ?Please get a complete blood count and chemistry panel checked by your Primary MD at your next visit, and again as instructed by your Primary MD. ? ?Get Medicines reviewed and adjusted: ?Please take all your medications with you for your next visit with your Primary MD ? ?Laboratory/radiological data: ?Please request your Primary MD to go over all hospital tests and procedure/radiological results at the follow up, please ask your primary care provider to get all Hospital records sent to his/her office. ? ?In some cases, they will be blood work, cultures and biopsy results pending at the time of your discharge. Please request that your primary care provider follow up on these results. ? ?If you are diabetic, please bring your blood sugar readings with you to your follow up appointment with primary care.   ? ?Please call and make your follow up appointments as soon as possible.   ? ?Also Note  the following: ?If you experience worsening of your admission symptoms, develop shortness of breath, life threatening emergency, suicidal or homicidal thoughts you must seek medical attention immediately by calling 911 or calling your MD immediately  if symptoms less severe. ? ?You must read complete instructions/literature along with all the possible adverse reactions/side effects for all the Medicines you take and that have been prescribed to you. Take any new Medicines after you have completely understood and accpet all the possible adverse reactions/side effects.  ? ?Do not drive when taking Pain medications or sleeping medications (Benzodiazepines) ? ?Do not take more than prescribed Pain, Sleep and Anxiety Medications. It is not advisable to combine anxiety,sleep and pain medications without talking with your primary care practitioner ? ?Special Instructions: If you have smoked or chewed Tobacco  in the last 2 yrs please stop smoking, stop any regular Alcohol  and or any Recreational drug use. ? ?Wear Seat belts while driving.  Do not drive if taking any narcotic, mind altering or controlled substances or recreational drugs or alcohol.  ? ? ? ? ? ?

## 2021-06-13 NOTE — Discharge Summary (Signed)
Physician Discharge Summary  ?Emily [name] KGU:[id number] DOB: 1961/01/06 DOA: 06/10/2021 ? ?PCP: [name], MD ? ?Admit date: 06/10/2021 ?Discharge date: 06/13/2021 ? ?Admitted From:  Home  ?Disposition: Home  ? ?Recommendations for Outpatient Follow-up:  ?Follow up with PCP in 1 weeks ? ? ?Home Health: DME Home Nebulizer  ? ?Discharge Condition: STABLE   ?CODE STATUS: FULL  ?DIET: heart/carb   ? ?Brief Hospitalization Summary: ?Please see all hospital notes, images, labs for full details of the hospitalization. ?61 year old female with longstanding COPD, chronic nicotine dependence with daily smoking, cardiomyopathy with systolic heart failure on Entresto, anxiety disorder, history of right breast cancer status post chemotherapy and XRT, chronic bronchitis, chronic depression and anxiety, type 2 diabetes mellitus, osteoarthritis and dyslipidemia, diabetic neuropathy presented to the emergency department with ongoing shortness of breath that has been present for the past week but symptoms have been progressive.  She had a video conference with her PCP earlier in the week and was prescribed a course of azithromycin and steroids.  She said initially she felt better after taking the medication but after completing her symptoms returned and she is continuing to have shortness of breath wheezing cough productive sputum.  She reports that her symptoms initially started with a sinus infection but the postnasal drainage into her chest has caused a lot of her respiratory symptoms.  Her main complaint is dyspnea.  She has no chest pain.  She has no palpitations.  She denies fever and chills.  She reports no known sick contacts but reports that she tends to get very ill with changes in weather.  She was initially evaluated in the emergency department and found to have an oxygen requirement of 4 L/min.  She is not oxygen dependent at baseline.  The patient also reports that she had improvement of symptoms after nebulizers  given in ED.  Unfortunately with slight ambulation getting up to go to the bathroom she became severely dyspneic and her pulse ox dropped down to the 80% range.  She is now requiring 2-4 L/min supplemental oxygen to maintain her pulse ox greater than 92%.  She was given IV steroids, albuterol nebulizer treatments and admission was requested for further management. ?  ? ?Hospital Course ? ?Pt responded well to IV steroids, antibiotics and nebs.  She is weaned down to room air.  She is feeling much better.  She requested a replacement home nebulizer.  She will follow up with her PCP.  She will have a 5 day course of prednisone to continue and complete her course of antibiotics. Pt is stable to discharge home today and reports she is feeling well.   ? ? ?Discharge Diagnoses:  ?Principal Problem: ?  COPD with acute exacerbation ([place]) ?Active Problems: ?  Type 2 diabetes mellitus with neurological complications ([place]) ?  GERD (gastroesophageal reflux disease) ?  Dyslipidemia associated with type 2 diabetes mellitus ([place]) ?  Insomnia ?  CAD (coronary artery disease) ?  Fatty liver disease, nonalcoholic ?  Neuropathic pain ?  Carotid stenosis ?  History of breast cancer ?  COPD (chronic obstructive pulmonary disease) ([place]) ?  Chronic heart failure with normal ejection fraction ([place]) ?  Chronic recurrent pancreatitis ([place]) ?  Hypertension associated with diabetes ([place]) ?  Nicotine dependence with current use ?  Chronic kidney disease, stage 3a ([place]) ?  Acute respiratory failure with hypoxia ([place]) ? ? ?Discharge Instructions: ? ?Allergies as of 06/13/2021   ? ?   Reactions  ? Contrast  Media [iodinated Contrast Media] Anaphylaxis  ? Gadolinium Shortness Of Breath, Other (See Comments)  ?  Code: SOB, Onset Date: 78242353  ? Iodine-131   ? Other reaction(s): Angioedema (ALLERGY/intolerance)  ? ?  ? ?  ?Medication List  ?  ? ?TAKE these medications   ? ?albuterol 108 (90 Base) MCG/ACT inhaler ?Commonly known as: VENTOLIN  HFA ?Inhale 2 puffs into the lungs every 6 (six) hours as needed for wheezing or shortness of breath. ?  ?ARIPiprazole 5 MG tablet ?Commonly known as: Abilify ?Take 1 tablet (5 mg total) by mouth daily. ?  ?aspirin EC 81 MG tablet ?Take 1 tablet (81 mg total) by mouth daily. ?  ?atorvastatin 20 MG tablet ?Commonly known as: LIPITOR ?TAKE 1 TABLET BY MOUTH ONCE DAILY KEEP  APPOINTMENT  FOR  FURTHER  REFILLS ?  ?carvedilol 6.25 MG tablet ?Commonly known as: COREG ?TAKE 1 TABLET BY MOUTH TWICE DAILY WITH A MEAL ?  ?citalopram 40 MG tablet ?Commonly known as: CELEXA ?Take 1 tablet (40 mg total) by mouth daily. ?  ?dextromethorphan 30 MG/5ML liquid ?Commonly known as: Delsym ?Take 5 mLs (30 mg total) by mouth 2 (two) times daily as needed for cough. ?  ?doxycycline 100 MG tablet ?Commonly known as: VIBRA-TABS ?Take 1 tablet (100 mg total) by mouth every 12 (twelve) hours for 3 days. ?  ?empagliflozin 25 MG Tabs tablet ?Commonly known as: Jardiance ?Take 1 tablet (25 mg total) by mouth daily before breakfast. ?  ?Entresto 49-51 MG ?Generic drug: sacubitril-valsartan ?Take 1 tablet by mouth 2 (two) times daily. ?  ?guaiFENesin 600 MG 12 hr tablet ?Commonly known as: [place] ?Take 2 tablets (1,200 mg total) by mouth 2 (two) times daily for 5 days. ?  ?hydrOXYzine 50 MG tablet ?Commonly known as: ATARAX ?TAKE 1 TABLET BY MOUTH THREE TIMES DAILY AS NEEDED FOR ITCHING ?  ?ipratropium-albuterol 0.5-2.5 (3) MG/3ML Soln ?Commonly known as: DUONEB ?Take 3 mLs by nebulization every 6 (six) hours as needed. ?What changed: Another medication with the same name was added. Make sure you understand how and when to take each. ?  ?ipratropium-albuterol 0.5-2.5 (3) MG/3ML Soln ?Commonly known as: DUONEB ?Take 3 mLs by nebulization every 4 (four) hours as needed (wheezing, shortness of breath, cough). ?What changed: You were already taking a medication with the same name, and this prescription was added. Make sure you understand how and  when to take each. ?  ?LORazepam 2 MG tablet ?Commonly known as: ATIVAN ?TAKE 1 TABLET BY MOUTH AT BEDTIME AS NEEDED FOR ANXIETY ?  ?metFORMIN 500 MG 24 hr tablet ?Commonly known as: Glucophage XR ?Take 2 tablets (1,000 mg total) by mouth 2 (two) times daily. ?  ?ONE TOUCH ULTRA MINI w/Device Kit ?Use to test blood sugars daily. Dx: E11.9 ?  ?oxyCODONE 5 MG immediate release tablet ?Commonly known as: Oxy IR/ROXICODONE ?Take 1 tablet (5 mg total) by mouth every 6 (six) hours as needed for up to 3 days for severe pain. ?  ?predniSONE 10 MG tablet ?Commonly known as: DELTASONE ?Take 3 tabs daily with breakfast x 5 days ?Start taking on: June 14, 2021 ?  ?TRESIBA [place] ?Inject 20 Units into the skin daily. ?  ? ?  ? ?  ?  ? ? ?  ?Durable Medical Equipment  ?(From admission, onward)  ?  ? ? ?  ? ?  Start     Ordered  ? 06/13/21 1213  For home use only DME Nebulizer machine  Once       ?Question Answer Comment  ?Patient needs a nebulizer to treat with the following condition COPD (chronic obstructive pulmonary disease) ([place])   ?Length of Need Lifetime   ?  ? 06/13/21 1212  ? ?  ?  ? ?  ? ? Follow-up Information   ? ? [name], MD. Schedule an appointment as soon as possible for a visit in 1 week(s).   ?Specialty: Family Medicine ?Why: Hospital Follow Up ?Contact information: ?[address] ?[place] [address] ?[phone] ? ? ?  ?  ? ?  ?  ? ?  ? ?Allergies  ?Allergen Reactions  ? Contrast Media [Iodinated Contrast Media] Anaphylaxis  ? Gadolinium Shortness Of Breath and Other (See Comments)  ?   Code: SOB, Onset Date: 18288337 ?  ? Iodine-131   ?  Other reaction(s): Angioedema (ALLERGY/intolerance)  ? ?Allergies as of 06/13/2021   ? ?   Reactions  ? Contrast Media [iodinated Contrast Media] Anaphylaxis  ? Gadolinium Shortness Of Breath, Other (See Comments)  ?  Code: SOB, Onset Date: 44514604  ? Iodine-131   ? Other reaction(s): Angioedema (ALLERGY/intolerance)  ? ?  ? ?  ?Medication List  ?  ? ?TAKE these  medications   ? ?albuterol 108 (90 Base) MCG/ACT inhaler ?Commonly known as: VENTOLIN HFA ?Inhale 2 puffs into the lungs every 6 (six) hours as needed for wheezing or shortness of breath. ?  ?ARIPiprazole 5 MG tablet ?C

## 2021-06-14 LAB — CULTURE, RESPIRATORY W GRAM STAIN: Culture: NORMAL

## 2021-06-16 ENCOUNTER — Encounter: Payer: Self-pay | Admitting: Family Medicine

## 2021-06-16 ENCOUNTER — Ambulatory Visit (INDEPENDENT_AMBULATORY_CARE_PROVIDER_SITE_OTHER): Payer: Medicare Other | Admitting: Family Medicine

## 2021-06-16 VITALS — BP 125/80 | HR 99 | Temp 98.1°F | Ht 65.0 in | Wt 154.2 lb

## 2021-06-16 DIAGNOSIS — I152 Hypertension secondary to endocrine disorders: Secondary | ICD-10-CM | POA: Diagnosis not present

## 2021-06-16 DIAGNOSIS — I2584 Coronary atherosclerosis due to calcified coronary lesion: Secondary | ICD-10-CM

## 2021-06-16 DIAGNOSIS — E1159 Type 2 diabetes mellitus with other circulatory complications: Secondary | ICD-10-CM

## 2021-06-16 DIAGNOSIS — I2510 Atherosclerotic heart disease of native coronary artery without angina pectoris: Secondary | ICD-10-CM | POA: Diagnosis not present

## 2021-06-16 DIAGNOSIS — S22050A Wedge compression fracture of T5-T6 vertebra, initial encounter for closed fracture: Secondary | ICD-10-CM | POA: Diagnosis not present

## 2021-06-16 DIAGNOSIS — G90529 Complex regional pain syndrome I of unspecified lower limb: Secondary | ICD-10-CM | POA: Diagnosis not present

## 2021-06-16 DIAGNOSIS — J449 Chronic obstructive pulmonary disease, unspecified: Secondary | ICD-10-CM | POA: Diagnosis not present

## 2021-06-16 DIAGNOSIS — E1149 Type 2 diabetes mellitus with other diabetic neurological complication: Secondary | ICD-10-CM

## 2021-06-16 MED ORDER — MONTELUKAST SODIUM 10 MG PO TABS: 10.0000 mg | ORAL_TABLET | Freq: Every day | ORAL | 3 refills | Status: DC

## 2021-06-16 MED ORDER — OXYCODONE HCL 5 MG PO TABS: 5.0000 mg | ORAL_TABLET | Freq: Four times a day (QID) | ORAL | 0 refills | Status: AC | PRN

## 2021-06-16 NOTE — Assessment & Plan Note (Signed)
At goal on entresto 49-51 twice daily and coreg 6.'25mg'$  twice daily per cardiology.  ?

## 2021-06-16 NOTE — Patient Instructions (Signed)
It was very nice to see you today! ? ?We will refill your medications.  Please start the Singulair.  Follow-up with me in 3 months.  Come back to see me sooner if needed. ? ?Take care, ?Dr Jerline Pain ? ?PLEASE NOTE: ? ?If you had any lab tests please let us know if you have not heard back within a few days. You may see your results on mychart before we have a chance to review them but we will give you a call once they are reviewed by [place]. If we ordered any referrals today, please let us know if you have not heard from their office within the next week.  ? ?Please try these tips to maintain a healthy lifestyle: ? ?Eat at least 3 REAL meals and 1-2 snacks per day.  Aim for no more than 5 hours between eating.  If you eat breakfast, please do so within one hour of getting up.  ? ?Each meal should contain half fruits/vegetables, one quarter protein, and one quarter carbs (no bigger than a computer mouse) ? ?Cut down on sweet beverages. This includes juice, soda, and sweet tea.  ? ?Drink at least 1 glass of water with each meal and aim for at least 8 glasses per day ? ?Exercise at least 150 minutes every week.   ?
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
## 2021-06-16 NOTE — Assessment & Plan Note (Signed)
A1c not controlled during hospitalization at 10.4.  Likely elevated due to recent systemic steroids.  She will continue tresiba 20 units daily, metformin 1000 mg twice daily and Jardiance 25 mg daily.  Recheck A1c in 3 months. ?

## 2021-06-16 NOTE — Assessment & Plan Note (Addendum)
Doing much better now since her hospitalization. Will add on singulair to her regimen which should help with her sinusitis issues as well. She will continue albuterol and duoneb as needed.  ?

## 2021-06-16 NOTE — Assessment & Plan Note (Signed)
Patient with continued pain despite conservative management.  She was given oxycodone during hospitalization with significant improvement of pain.  She is not interested in any sort of surgical intervention at this time.   ? ?We had lengthy discussion regarding continuing her oxycodone.  We cannot use NSAIDs due to her CKD and cardiac history.  Would avoid prednisone as much as possible due to her history of diabetes.  We discussed referral to pain management however she declined.  Would be reasonable to continue with low-dose oxycodone as needed to help manage her pain she does not have any other options at this point. ? ?We discussed risks including addiction potential and  respiratory depression.  Database was reviewed at red flags.  Controlled substance agreement was reviewed, signed, and scanned into the chart. Will refill her oxycodone today and she will follow up with me in 3 months for follow up.  ?

## 2021-06-16 NOTE — Assessment & Plan Note (Addendum)
Follows with cardiology.  Continue Coreg 6.25 mg twice daily per cardiology. ?

## 2021-06-17 ENCOUNTER — Telehealth: Payer: Self-pay

## 2021-06-17 NOTE — Chronic Care Management (AMB) (Signed)
?  Care Management  ? ?Note ? ?06/17/2021 ?Name: Emily [name] MRN: [id number] DOB: 11-28-1960 ? ?Emily [name] is a 61 y.o. year old female who is a primary care patient of [name], MD. I reached out to [name] by phone today offer care coordination services.  ? ?[name] was given information about care management services today including:  ?Care management services include personalized support from designated clinical staff supervised by her physician, including individualized plan of care and coordination with other care providers ?24/7 contact phone numbers for assistance for urgent and routine care needs. ?The patient may stop care management services at any time by phone call to the office staff. ? ?Patient did not agree to enrollment in care management services and does not wish to consider at this time. ? ?Follow up plan: ?Patient declines further engagement by the care management team. Appropriate care team members and provider have been notified via electronic communication.  ? ?Noreene Larsson, RMA ?Care Guide, Embedded Care Coordination ?[facility]  ?[place], [place] [address] ?Direct Dial: [phone] ?[email or web address] ?[email or web address]: [email or web address]  ? ?

## 2021-06-20 ENCOUNTER — Other Ambulatory Visit: Payer: Medicare Other

## 2021-06-20 DIAGNOSIS — R0989 Other specified symptoms and signs involving the circulatory and respiratory systems: Secondary | ICD-10-CM | POA: Diagnosis not present

## 2021-06-20 DIAGNOSIS — K2100 Gastro-esophageal reflux disease with esophagitis, without bleeding: Secondary | ICD-10-CM | POA: Diagnosis not present

## 2021-06-20 DIAGNOSIS — R4702 Dysphasia: Secondary | ICD-10-CM | POA: Diagnosis not present

## 2021-06-21 ENCOUNTER — Encounter: Payer: Self-pay | Admitting: Family Medicine

## 2021-06-22 ENCOUNTER — Other Ambulatory Visit: Payer: Self-pay | Admitting: *Deleted

## 2021-06-22 MED ORDER — FLUCONAZOLE 150 MG PO TABS: 150.0000 mg | ORAL_TABLET | Freq: Once | ORAL | 0 refills | Status: AC

## 2021-06-22 NOTE — Telephone Encounter (Signed)
Ok to send in one time dose of diflucan '150mg'$ . ? ?Karrisa Didio M. Jerline Pain, MD ?06/22/2021 8:26 AM  ? ?

## 2021-08-04 ENCOUNTER — Other Ambulatory Visit (HOSPITAL_COMMUNITY): Payer: Self-pay | Admitting: Internal Medicine

## 2021-08-04 DIAGNOSIS — E785 Hyperlipidemia, unspecified: Secondary | ICD-10-CM

## 2021-08-22 ENCOUNTER — Other Ambulatory Visit: Payer: Self-pay | Admitting: Family Medicine

## 2021-08-22 ENCOUNTER — Encounter: Payer: Self-pay | Admitting: Family Medicine

## 2021-08-22 ENCOUNTER — Telehealth: Payer: Self-pay | Admitting: Family Medicine

## 2021-08-22 NOTE — Telephone Encounter (Signed)
Please advise 

## 2021-08-25 MED ORDER — OXYCODONE HCL 5 MG PO TABS: 5.0000 mg | ORAL_TABLET | Freq: Four times a day (QID) | ORAL | 0 refills | Status: DC | PRN

## 2021-08-25 MED ORDER — LORAZEPAM 2 MG PO TABS: ORAL_TABLET | ORAL | 0 refills | Status: DC

## 2021-08-25 MED ORDER — HYDROXYZINE HCL 50 MG PO TABS: 50.0000 mg | ORAL_TABLET | Freq: Three times a day (TID) | ORAL | 0 refills | Status: DC | PRN

## 2021-08-26 ENCOUNTER — Ambulatory Visit: Payer: Medicare Other

## 2021-09-15 ENCOUNTER — Ambulatory Visit: Payer: Medicare Other | Admitting: Family Medicine

## 2021-09-19 ENCOUNTER — Telehealth: Payer: Self-pay | Admitting: Family Medicine

## 2021-09-19 ENCOUNTER — Ambulatory Visit: Payer: Medicare Other

## 2021-09-19 NOTE — Telephone Encounter (Signed)
Copied from [place] [id number]. Topic: Medicare AWV >> Sep 19, 2021  9:51 AM Devoria Glassing wrote: Reason for CRM: Left message for patient to schedule Annual Wellness Visit.  Please schedule with Nurse Health Advisor Charlott Rakes, RN at [facility]. This appt can be telephone or office visit. Please call [phone] ask for [facility]

## 2021-09-22 ENCOUNTER — Ambulatory Visit (INDEPENDENT_AMBULATORY_CARE_PROVIDER_SITE_OTHER): Payer: Medicare Other

## 2021-09-22 DIAGNOSIS — Z0000 Encounter for general adult medical examination without abnormal findings: Secondary | ICD-10-CM | POA: Diagnosis not present
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
## 2021-09-22 NOTE — Patient Instructions (Signed)
Emily [name] , Thank you for taking time to come for your Medicare Wellness Visit. I appreciate your ongoing commitment to your health goals. Please review the following plan we discussed and let me know if I can assist you in the future.   Screening recommendations/referrals: Colonoscopy: Done 08/05/19 repeat every 3 years Mammogram: done 12/19/19 repeat every year  Bone Density: done 10/05/14 repeat every 2 years  Recommended yearly ophthalmology/optometry visit for glaucoma screening and checkup Recommended yearly dental visit for hygiene and checkup  Vaccinations: Influenza vaccine: Done 11/05/20 repeat every year  Pneumococcal vaccine: Up to date Tdap vaccine: done 11/15/12 Shingles vaccine: completed 4/21, 08/17/21    Covid-19:completed 4/9, 12/06/19 7& 07/30/20  Advanced directives: copies in chart   Conditions/risks identified: none at this time  Next appointment: Follow up in one year for your annual wellness visit    Preventive Care 65 Years and Older, Female Preventive care refers to lifestyle choices and visits with your health care provider that can promote health and wellness. What does preventive care include? A yearly physical exam. This is also called an annual well check. Dental exams once or twice a year. Routine eye exams. Ask your health care provider how often you should have your eyes checked. Personal lifestyle choices, including: Daily care of your teeth and gums. Regular physical activity. Eating a healthy diet. Avoiding tobacco and drug use. Limiting alcohol use. Practicing safe sex. Taking low-dose aspirin every day. Taking vitamin and mineral supplements as recommended by your health care provider. What happens during an annual well check? The services and screenings done by your health care provider during your annual well check will depend on your age, overall health, lifestyle risk factors, and family history of disease. Counseling  Your health care  provider may ask you questions about your: Alcohol use. Tobacco use. Drug use. Emotional well-being. Home and relationship well-being. Sexual activity. Eating habits. History of falls. Memory and ability to understand (cognition). Work and work Statistician. Reproductive health. Screening  You may have the following tests or measurements: Height, weight, and BMI. Blood pressure. Lipid and cholesterol levels. These may be checked every 5 years, or more frequently if you are over 57 years old. Skin check. Lung cancer screening. You may have this screening every year starting at age 27 if you have a 30-pack-year history of smoking and currently smoke or have quit within the past 15 years. Fecal occult blood test (FOBT) of the stool. You may have this test every year starting at age 55. Flexible sigmoidoscopy or colonoscopy. You may have a sigmoidoscopy every 5 years or a colonoscopy every 10 years starting at age 8. Hepatitis C blood test. Hepatitis B blood test. Sexually transmitted disease (STD) testing. Diabetes screening. This is done by checking your blood sugar (glucose) after you have not eaten for a while (fasting). You may have this done every 1-3 years. Bone density scan. This is done to screen for osteoporosis. You may have this done starting at age 90. Mammogram. This may be done every 1-2 years. Talk to your health care provider about how often you should have regular mammograms. Talk with your health care provider about your test results, treatment options, and if necessary, the need for more tests. Vaccines  Your health care provider may recommend certain vaccines, such as: Influenza vaccine. This is recommended every year. Tetanus, diphtheria, and acellular pertussis (Tdap, Td) vaccine. You may need a Td booster every 10 years. Zoster vaccine. You may need this after  age 30. Pneumococcal 13-valent conjugate (PCV13) vaccine. One dose is recommended after age  80. Pneumococcal polysaccharide (PPSV23) vaccine. One dose is recommended after age 30. Talk to your health care provider about which screenings and vaccines you need and how often you need them. This information is not intended to replace advice given to you by your health care provider. Make sure you discuss any questions you have with your health care provider. Document Released: 03/12/2015 Document Revised: 11/03/2015 Document Reviewed: 12/15/2014 Elsevier Interactive Patient Education  2017 [place] Prevention in the Home Falls can cause injuries. They can happen to people of all ages. There are many things you can do to make your home safe and to help prevent falls. What can I do on the outside of my home? Regularly fix the edges of walkways and driveways and fix any cracks. Remove anything that might make you trip as you walk through a door, such as a raised step or threshold. Trim any bushes or trees on the path to your home. Use bright outdoor lighting. Clear any walking paths of anything that might make someone trip, such as rocks or tools. Regularly check to see if handrails are loose or broken. Make sure that both sides of any steps have handrails. Any raised decks and porches should have guardrails on the edges. Have any leaves, snow, or ice cleared regularly. Use sand or salt on walking paths during winter. Clean up any spills in your garage right away. This includes oil or grease spills. What can I do in the bathroom? Use night lights. Install grab bars by the toilet and in the tub and shower. Do not use towel bars as grab bars. Use non-skid mats or decals in the tub or shower. If you need to sit down in the shower, use a plastic, non-slip stool. Keep the floor dry. Clean up any water that spills on the floor as soon as it happens. Remove soap buildup in the tub or shower regularly. Attach bath mats securely with double-sided non-slip rug tape. Do not have throw  rugs and other things on the floor that can make you trip. What can I do in the bedroom? Use night lights. Make sure that you have a light by your bed that is easy to reach. Do not use any sheets or blankets that are too big for your bed. They should not hang down onto the floor. Have a firm chair that has side arms. You can use this for support while you get dressed. Do not have throw rugs and other things on the floor that can make you trip. What can I do in the kitchen? Clean up any spills right away. Avoid walking on wet floors. Keep items that you use a lot in easy-to-reach places. If you need to reach something above you, use a strong step stool that has a grab bar. Keep electrical cords out of the way. Do not use floor polish or wax that makes floors slippery. If you must use wax, use non-skid floor wax. Do not have throw rugs and other things on the floor that can make you trip. What can I do with my stairs? Do not leave any items on the stairs. Make sure that there are handrails on both sides of the stairs and use them. Fix handrails that are broken or loose. Make sure that handrails are as long as the stairways. Check any carpeting to make sure that it is firmly attached to the stairs.  Fix any carpet that is loose or worn. Avoid having throw rugs at the top or bottom of the stairs. If you do have throw rugs, attach them to the floor with carpet tape. Make sure that you have a light switch at the top of the stairs and the bottom of the stairs. If you do not have them, ask someone to add them for you. What else can I do to help prevent falls? Wear shoes that: Do not have high heels. Have rubber bottoms. Are comfortable and fit you well. Are closed at the toe. Do not wear sandals. If you use a stepladder: Make sure that it is fully opened. Do not climb a closed stepladder. Make sure that both sides of the stepladder are locked into place. Ask someone to hold it for you, if  possible. Clearly mark and make sure that you can see: Any grab bars or handrails. First and last steps. Where the edge of each step is. Use tools that help you move around (mobility aids) if they are needed. These include: Canes. Walkers. Scooters. Crutches. Turn on the lights when you go into a dark area. Replace any light bulbs as soon as they burn out. Set up your furniture so you have a clear path. Avoid moving your furniture around. If any of your floors are uneven, fix them. If there are any pets around you, be aware of where they are. Review your medicines with your doctor. Some medicines can make you feel dizzy. This can increase your chance of falling. Ask your doctor what other things that you can do to help prevent falls. This information is not intended to replace advice given to you by your health care provider. Make sure you discuss any questions you have with your health care provider. Document Released: 12/10/2008 Document Revised: 07/22/2015 Document Reviewed: 03/20/2014 Elsevier Interactive Patient Education  2017 [employer].

## 2021-09-24 ENCOUNTER — Encounter: Payer: Self-pay | Admitting: Family Medicine

## 2021-09-26 ENCOUNTER — Other Ambulatory Visit: Payer: Self-pay | Admitting: *Deleted

## 2021-09-26 MED ORDER — GLUCOSE BLOOD VI STRP: ORAL_STRIP | 12 refills | Status: DC

## 2021-09-26 NOTE — Telephone Encounter (Signed)
Rx test Strips sent to pharmacy

## 2021-10-07 ENCOUNTER — Other Ambulatory Visit: Payer: Self-pay | Admitting: Family Medicine

## 2021-11-05 ENCOUNTER — Other Ambulatory Visit: Payer: Self-pay | Admitting: Family Medicine

## 2021-11-12 ENCOUNTER — Encounter: Payer: Self-pay | Admitting: Family Medicine

## 2021-11-21 ENCOUNTER — Encounter: Payer: Self-pay | Admitting: *Deleted

## 2021-11-24 DIAGNOSIS — B372 Candidiasis of skin and nail: Secondary | ICD-10-CM | POA: Diagnosis not present

## 2021-12-06 ENCOUNTER — Other Ambulatory Visit: Payer: Self-pay | Admitting: Family Medicine

## 2021-12-08 ENCOUNTER — Other Ambulatory Visit: Payer: Self-pay | Admitting: Family Medicine

## 2021-12-08 DIAGNOSIS — D0001 Carcinoma in situ of labial mucosa and vermilion border: Secondary | ICD-10-CM | POA: Diagnosis not present

## 2021-12-08 DIAGNOSIS — D040 Carcinoma in situ of skin of lip: Secondary | ICD-10-CM | POA: Diagnosis not present

## 2021-12-15 ENCOUNTER — Ambulatory Visit (INDEPENDENT_AMBULATORY_CARE_PROVIDER_SITE_OTHER): Payer: Medicare Other | Admitting: Family Medicine

## 2021-12-15 ENCOUNTER — Encounter: Payer: Self-pay | Admitting: Family Medicine

## 2021-12-15 VITALS — BP 108/73 | HR 79 | Temp 98.2°F | Ht 65.0 in | Wt 154.0 lb

## 2021-12-15 DIAGNOSIS — E1159 Type 2 diabetes mellitus with other circulatory complications: Secondary | ICD-10-CM

## 2021-12-15 DIAGNOSIS — S22050A Wedge compression fracture of T5-T6 vertebra, initial encounter for closed fracture: Secondary | ICD-10-CM | POA: Diagnosis not present

## 2021-12-15 DIAGNOSIS — I152 Hypertension secondary to endocrine disorders: Secondary | ICD-10-CM | POA: Diagnosis not present

## 2021-12-15 DIAGNOSIS — E1149 Type 2 diabetes mellitus with other diabetic neurological complication: Secondary | ICD-10-CM | POA: Diagnosis not present

## 2021-12-15 LAB — POCT GLYCOSYLATED HEMOGLOBIN (HGB A1C): Hemoglobin A1C: 8.9 % — AB (ref 4.0–5.6)

## 2021-12-15 LAB — HM DIABETES EYE EXAM

## 2021-12-15 MED ORDER — METFORMIN HCL ER 500 MG PO TB24: 1000.0000 mg | ORAL_TABLET | Freq: Two times a day (BID) | ORAL | 3 refills | Status: DC

## 2021-12-15 MED ORDER — HYDROXYZINE HCL 50 MG PO TABS: 50.0000 mg | ORAL_TABLET | Freq: Three times a day (TID) | ORAL | 0 refills | Status: DC | PRN

## 2021-12-15 MED ORDER — OXYCODONE HCL 5 MG PO TABS: 5.0000 mg | ORAL_TABLET | Freq: Four times a day (QID) | ORAL | 0 refills | Status: DC | PRN

## 2021-12-15 NOTE — Assessment & Plan Note (Signed)
At goal on Entresto 49-51 twice daily and Coreg 6.25 mg twice daily per cardiology.

## 2021-12-15 NOTE — Assessment & Plan Note (Signed)
Database reviewed without red flags.  She has been tolerating oxycodone well.  No significant side effects.  She uses sparingly as needed for pain.  She does not have any other options for pain control due to her CKD and cardiac history.  We will refill her oxycodone today.  Follow-up again in 3 months.

## 2021-12-15 NOTE — Assessment & Plan Note (Addendum)
A1c better at 8.9.  We will increase her metformin to 1000 mg twice daily.  We will continue Tresiba 20 units daily and Jardiance 25 mg daily.  We are avoiding GLP-1 due to history of recurrent pancreatitis.She will come back in 3 months to recheck A1c.

## 2021-12-15 NOTE — Patient Instructions (Signed)
It was very nice to see you today!  I will refill your pain medication today.  Your A1c today is 8.9.  We need to get this closer to 7.  Please increase your metformin to 1000 mg twice daily.  This is double your current dose.  I will refill your hydroxyzine today as well.  We will see you back in 3 months.  Please come back to see [place] sooner if needed.  Take care, Dr Jerline Pain  PLEASE NOTE:  If you had any lab tests please let us know if you have not heard back within a few days. You may see your results on mychart before we have a chance to review them but we will give you a call once they are reviewed by [place]. If we ordered any referrals today, please let us know if you have not heard from their office within the next week.   Please try these tips to maintain a healthy lifestyle:  Eat at least 3 REAL meals and 1-2 snacks per day.  Aim for no more than 5 hours between eating.  If you eat breakfast, please do so within one hour of getting up.   Each meal should contain half fruits/vegetables, one quarter protein, and one quarter carbs (no bigger than a computer mouse)  Cut down on sweet beverages. This includes juice, soda, and sweet tea.   Drink at least 1 glass of water with each meal and aim for at least 8 glasses per day  Exercise at least 150 minutes every week.

## 2021-12-15 NOTE — Progress Notes (Signed)
   Emily [name] is a 61 y.o. female who presents today for an office visit.  Assessment/Plan:  Chronic Problems Addressed Today: Type 2 diabetes mellitus with neurological complications (HCC) M7B better at 8.9.  We will increase her metformin to 1000 mg twice daily.  We will continue Tresiba 20 units daily and Jardiance 25 mg daily.  We are avoiding GLP-1 due to history of recurrent pancreatitis.She will come back in 3 months to recheck A1c.  Chronic pain secondary to Thoracic compression fracture [facility]) Database reviewed without red flags.  She has been tolerating oxycodone well.  No significant side effects.  She uses sparingly as needed for pain.  She does not have any other options for pain control due to her CKD and cardiac history.  We will refill her oxycodone today.  Follow-up again in 3 months.  Hypertension associated with diabetes ([place]) At goal on Entresto 49-51 twice daily and Coreg 6.25 mg twice daily per cardiology.     Subjective:  HPI:  See A/p for status of chronic conditions.         Objective:  Physical Exam: BP 108/73   Pulse 79   Temp 98.2 F (36.8 C) (Temporal)   Ht '5\' 5"'$  (1.651 m)   Wt 154 lb (69.9 kg)   SpO2 96%   BMI 25.63 kg/m   Gen: No acute distress, resting comfortably CV: Regular rate and rhythm with no murmurs appreciated Pulm: Normal work of breathing, clear to auscultation bilaterally with no crackles, wheezes, or rhonchi Neuro: Grossly normal, moves all extremities Psych: Normal affect and thought content      Mabel Unrein M. Jerline Pain, MD 12/15/2021 2:05 PM

## 2022-01-11 ENCOUNTER — Other Ambulatory Visit: Payer: Self-pay | Admitting: Family Medicine

## 2022-01-18 DIAGNOSIS — L578 Other skin changes due to chronic exposure to nonionizing radiation: Secondary | ICD-10-CM | POA: Diagnosis not present

## 2022-01-18 DIAGNOSIS — L570 Actinic keratosis: Secondary | ICD-10-CM | POA: Diagnosis not present

## 2022-01-23 ENCOUNTER — Encounter: Payer: Self-pay | Admitting: Family Medicine

## 2022-01-30 ENCOUNTER — Other Ambulatory Visit: Payer: Self-pay | Admitting: Family Medicine

## 2022-01-31 MED ORDER — OXYCODONE HCL 5 MG PO TABS: 5.0000 mg | ORAL_TABLET | Freq: Four times a day (QID) | ORAL | 0 refills | Status: DC | PRN

## 2022-02-04 ENCOUNTER — Other Ambulatory Visit: Payer: Self-pay | Admitting: Family Medicine

## 2022-02-08 ENCOUNTER — Other Ambulatory Visit (HOSPITAL_COMMUNITY): Payer: Self-pay

## 2022-02-08 ENCOUNTER — Other Ambulatory Visit (HOSPITAL_COMMUNITY): Payer: Self-pay | Admitting: *Deleted

## 2022-02-08 ENCOUNTER — Telehealth (HOSPITAL_COMMUNITY): Payer: Self-pay

## 2022-02-08 MED ORDER — EMPAGLIFLOZIN 25 MG PO TABS: 25.0000 mg | ORAL_TABLET | Freq: Every day | ORAL | 3 refills | Status: DC

## 2022-02-08 MED ORDER — ENTRESTO 49-51 MG PO TABS: 1.0000 | ORAL_TABLET | Freq: Two times a day (BID) | ORAL | 11 refills | Status: DC

## 2022-02-08 NOTE — Telephone Encounter (Signed)
Advanced Heart Failure Patient Advocate Encounter  The patient was approved for a [employer] grant that will help cover the cost of Entresto, Jardiance.  Total amount awarded, $10,000.  Effective: 01/09/22 - 01/09/23.  BIN [id number] PCN [id number] Group [id number] ID [id number]  New prescription(s) sent to [facility], along with billing information.  Clista Bernhardt, CPhT Rx Patient Advocate Phone: [phone]

## 2022-02-23 ENCOUNTER — Other Ambulatory Visit (HOSPITAL_COMMUNITY): Payer: Medicare Other

## 2022-02-23 ENCOUNTER — Encounter (HOSPITAL_COMMUNITY): Payer: Medicare Other

## 2022-02-27 ENCOUNTER — Other Ambulatory Visit: Payer: Self-pay | Admitting: Family Medicine

## 2022-02-28 ENCOUNTER — Other Ambulatory Visit (HOSPITAL_COMMUNITY): Payer: Self-pay | Admitting: *Deleted

## 2022-02-28 MED ORDER — LORAZEPAM 2 MG PO TABS: 2.0000 mg | ORAL_TABLET | Freq: Every evening | ORAL | 0 refills | Status: DC | PRN

## 2022-02-28 MED ORDER — EMPAGLIFLOZIN 25 MG PO TABS: 25.0000 mg | ORAL_TABLET | Freq: Every day | ORAL | 3 refills | Status: DC

## 2022-02-28 MED ORDER — ENTRESTO 49-51 MG PO TABS: 1.0000 | ORAL_TABLET | Freq: Two times a day (BID) | ORAL | 11 refills | Status: DC

## 2022-02-28 MED ORDER — HYDROXYZINE HCL 50 MG PO TABS: 50.0000 mg | ORAL_TABLET | Freq: Three times a day (TID) | ORAL | 3 refills | Status: DC | PRN

## 2022-02-28 MED ORDER — OXYCODONE HCL 5 MG PO TABS: 5.0000 mg | ORAL_TABLET | Freq: Four times a day (QID) | ORAL | 0 refills | Status: DC | PRN

## 2022-03-02 ENCOUNTER — Other Ambulatory Visit (HOSPITAL_COMMUNITY): Payer: Self-pay

## 2022-03-02 ENCOUNTER — Ambulatory Visit (HOSPITAL_BASED_OUTPATIENT_CLINIC_OR_DEPARTMENT_OTHER)
Admission: RE | Admit: 2022-03-02 | Discharge: 2022-03-02 | Disposition: A | Discharge status: Home / Self Care | Payer: Medicare Other | Source: Ambulatory Visit | Attending: Family Medicine | Admitting: Family Medicine

## 2022-03-02 ENCOUNTER — Ambulatory Visit (HOSPITAL_COMMUNITY)
Admission: RE | Admit: 2022-03-02 | Discharge: 2022-03-02 | Disposition: A | Discharge status: Home / Self Care | Payer: Medicare Other | Source: Ambulatory Visit | Attending: Internal Medicine | Admitting: Internal Medicine

## 2022-03-02 ENCOUNTER — Encounter (HOSPITAL_COMMUNITY): Payer: Self-pay

## 2022-03-02 VITALS — BP 90/64 | HR 92 | Wt 161.0 lb

## 2022-03-02 DIAGNOSIS — M4850XA Collapsed vertebra, not elsewhere classified, site unspecified, initial encounter for fracture: Secondary | ICD-10-CM | POA: Diagnosis not present

## 2022-03-02 DIAGNOSIS — F1490 Cocaine use, unspecified, uncomplicated: Secondary | ICD-10-CM | POA: Diagnosis not present

## 2022-03-02 DIAGNOSIS — C50919 Malignant neoplasm of unspecified site of unspecified female breast: Secondary | ICD-10-CM | POA: Diagnosis not present

## 2022-03-02 DIAGNOSIS — Z720 Tobacco use: Secondary | ICD-10-CM

## 2022-03-02 DIAGNOSIS — Z794 Long term (current) use of insulin: Secondary | ICD-10-CM

## 2022-03-02 DIAGNOSIS — E875 Hyperkalemia: Secondary | ICD-10-CM | POA: Diagnosis not present

## 2022-03-02 DIAGNOSIS — Z8781 Personal history of (healed) traumatic fracture: Secondary | ICD-10-CM

## 2022-03-02 DIAGNOSIS — I5042 Chronic combined systolic (congestive) and diastolic (congestive) heart failure: Secondary | ICD-10-CM | POA: Insufficient documentation

## 2022-03-02 DIAGNOSIS — R9431 Abnormal electrocardiogram [ECG] [EKG]: Secondary | ICD-10-CM | POA: Insufficient documentation

## 2022-03-02 DIAGNOSIS — I5032 Chronic diastolic (congestive) heart failure: Secondary | ICD-10-CM

## 2022-03-02 DIAGNOSIS — Z7984 Long term (current) use of oral hypoglycemic drugs: Secondary | ICD-10-CM | POA: Diagnosis not present

## 2022-03-02 DIAGNOSIS — E119 Type 2 diabetes mellitus without complications: Secondary | ICD-10-CM | POA: Insufficient documentation

## 2022-03-02 DIAGNOSIS — I428 Other cardiomyopathies: Secondary | ICD-10-CM | POA: Diagnosis not present

## 2022-03-02 DIAGNOSIS — Z853 Personal history of malignant neoplasm of breast: Secondary | ICD-10-CM | POA: Diagnosis not present

## 2022-03-02 DIAGNOSIS — I2510 Atherosclerotic heart disease of native coronary artery without angina pectoris: Secondary | ICD-10-CM | POA: Diagnosis not present

## 2022-03-02 DIAGNOSIS — I5022 Chronic systolic (congestive) heart failure: Secondary | ICD-10-CM

## 2022-03-02 DIAGNOSIS — Z79899 Other long term (current) drug therapy: Secondary | ICD-10-CM | POA: Diagnosis not present

## 2022-03-02 DIAGNOSIS — F17210 Nicotine dependence, cigarettes, uncomplicated: Secondary | ICD-10-CM | POA: Insufficient documentation

## 2022-03-02 DIAGNOSIS — I6529 Occlusion and stenosis of unspecified carotid artery: Secondary | ICD-10-CM

## 2022-03-02 DIAGNOSIS — J449 Chronic obstructive pulmonary disease, unspecified: Secondary | ICD-10-CM | POA: Diagnosis not present

## 2022-03-02 DIAGNOSIS — E1169 Type 2 diabetes mellitus with other specified complication: Secondary | ICD-10-CM

## 2022-03-02 LAB — ECHOCARDIOGRAM COMPLETE
Area-P 1/2: 4.4 cm2
Calc EF: 52 %
S' Lateral: 3.6 cm
Single Plane A2C EF: 53.8 %
Single Plane A4C EF: 49.4 %

## 2022-03-02 LAB — BASIC METABOLIC PANEL
Anion gap: 7 (ref 5–15)
BUN: 25 mg/dL — ABNORMAL HIGH (ref 8–23)
CO2: 25 mmol/L (ref 22–32)
Calcium: 8.8 mg/dL — ABNORMAL LOW (ref 8.9–10.3)
Chloride: 102 mmol/L (ref 98–111)
Creatinine, Ser: 1.02 mg/dL — ABNORMAL HIGH (ref 0.44–1.00)
GFR, Estimated: >60 mL/min (ref >60)
Glucose, Bld: 189 mg/dL — ABNORMAL HIGH (ref 70–99)
Potassium: 5 mmol/L (ref 3.5–5.1)
Sodium: 134 mmol/L — ABNORMAL LOW (ref 135–145)

## 2022-03-02 LAB — BRAIN NATRIURETIC PEPTIDE: B Natriuretic Peptide: 15.8 pg/mL (ref 0.0–100.0)

## 2022-03-02 MED ORDER — ENTRESTO 24-26 MG PO TABS: 1.0000 | ORAL_TABLET | Freq: Two times a day (BID) | ORAL | 3 refills | Status: DC

## 2022-03-02 NOTE — Progress Notes (Signed)
Patient ID: Emily [name], female   DOB: May 30, 1960, 62 y.o.   MRN: [id number]   ADVANCED HF CLINIC NOTE  PCP: Emily [name], Emily Greenhouse, MD HF Cardiologist: Dr. Haroldine [name] Oncologist: Dr Emily [name]   Emily [name] is a 62 yo woman with a history of ER + R breast cancer s/p R lumpectomy, DM2, cholelithiasis s/p cholecystectomy (02/2013), COPD and systolic CHF due to nonischemic cardiomyopathy.    Patient was admitted in 8/14 with acute pulmonary edema and cardiogenic shock.  LHC showed moderate nonobstructive CAD (40-50% mLAD, 50-70% LCx, 50% PDA).  She was started on milrinone and diuresed.  Cause of cardiomyopathy suspected to be Adriamycin toxicity.    12/05/12 Carotid Dopplers- R 39 % stenosis and L  40-59% stenosis   Had surgery 4/17 in [place] to remove R chest leiomyosarcoma. Margins not clear so underwent repeat resection here with Emily [name].    Admitted 5/17 with recurrent HF. Found to have EF 10-15% with NYHA IIIB-IV symptoms. UDS + cocaine. Cath showed non-obstructive CAD with CGS, initial PA sat 34%. Started on milrinone. Diuresed about 5 pounds. Milrinone weaned off and co-ox dropped down to 52% but was feeling ok.   Echo 12/22/19: EF 50-55%   Echo 7/22 EF 55-60%   Follow up 3/23, BP on the low side and Entresto cut back to 49/51.  Admitted 4/23 with COPD exacerbation. Treated with IV steroids, and abx.   Today she returns for HF follow up. Overall feeling fine. Has some dizziness, she fell last month and broke a couple ribs. No syncope, says she tripped. Has SOB walking up steps. Denies palpitations, abnormal bleeding, CP, edema, or PND/Orthopnea. Appetite ok. No fever or chills. Weight at home 149-150 pounds. Taking all medications. Smokes 1 ppd, no ETOH recently, no drugs.   Echo today 03/02/22 showed EF 50-55%, RV normal.    Cardidac Studies:  Echo 8/14: EF 20-25% Echo 11/14: EF 30-35% Echo 4/15: EF 45-50% septum dysynnergic Echo 5/17: EF 15-20% Echo 10/17: EF 55-60% Echo 1/19: EF  40-45%. Echo 1/20: EF 55-60%  Echo 1/24: EF 50-55%  - PFTs 1/19 FEV1 1.23 (45%) FVC 1.69 (48%) DLCO 61%  - Cath 5/17 Ao = 106/72 (87) LV =  108/28 (36) RA =  22 RV = 60/14/25 RA = 67/22 (51) PCW = 31 Fick cardiac output/index = 2.8/1.6 SVR = 1823 PVR = 7.0 WU FA sat = 90% PA sat = 34%, 42%  1. Mild non-obstructive CAD 2. Severe NICM with EF 25% by echo 3. Cardiogenic shock physiology with markedly elevated biventricular filling pressures Lexiscan 09/23/13: EF 50% normal perfusion Holter: SR occasional PVCs   Review of systems complete and found to be negative unless listed in HPI.    Current Outpatient Medications  Medication Sig Dispense Refill   albuterol (VENTOLIN HFA) 108 (90 Base) MCG/ACT inhaler Inhale 2 puffs into the lungs every 6 (six) hours as needed for wheezing or shortness of breath. 1 each 0   aspirin EC 81 MG tablet Take 1 tablet (81 mg total) by mouth daily. 90 tablet 3   atorvastatin (LIPITOR) 20 MG tablet Take 1 tablet (20 mg total) by mouth daily. 90 tablet 3   Blood Glucose Monitoring Suppl (ONE TOUCH ULTRA MINI) w/Device KIT Use to test blood sugars daily. Dx: E11.9 1 kit 3   carvedilol (COREG) 6.25 MG tablet TAKE 1 TABLET BY MOUTH TWICE DAILY WITH A MEAL 180 tablet 4   citalopram (CELEXA) 40 MG tablet Take 1 tablet by  mouth once daily 90 tablet 0   dextromethorphan (DELSYM) 30 MG/5ML liquid Take 5 mLs (30 mg total) by mouth 2 (two) times daily as needed for cough. 89 mL 0   empagliflozin (JARDIANCE) 25 MG TABS tablet Take 1 tablet (25 mg total) by mouth daily before breakfast. 100 tablet 3   glucose blood test strip Use as instructed E11.49 100 each 12   hydrOXYzine (ATARAX) 50 MG tablet Take 1 tablet (50 mg total) by mouth 3 (three) times daily as needed for itching. 90 tablet 3   Insulin Degludec (TRESIBA Emily [name]) Inject 20 Units into the skin daily.     ipratropium-albuterol (DUONEB) 0.5-2.5 (3) MG/3ML SOLN Take 3 mLs by nebulization every 4 (four) hours  as needed (wheezing, shortness of breath, cough). 360 mL 1   LORazepam (ATIVAN) 2 MG tablet Take 1 tablet (2 mg total) by mouth at bedtime as needed for anxiety. Do not take with pain medications. 90 tablet 0   metFORMIN (GLUCOPHAGE-XR) 500 MG 24 hr tablet Take 2 tablets (1,000 mg total) by mouth 2 (two) times daily. 360 tablet 3   montelukast (SINGULAIR) 10 MG tablet TAKE 1 TABLET BY MOUTH AT BEDTIME 30 tablet 0   nystatin cream (MYCOSTATIN) Apply topically 2 (two) times daily.     oxyCODONE (ROXICODONE) 5 MG immediate release tablet Take 1 tablet (5 mg total) by mouth every 6 (six) hours as needed for severe pain. 30 tablet 0   sacubitril-valsartan (ENTRESTO) 49-51 MG Take 1 tablet by mouth 2 (two) times daily. 60 tablet 11   triamcinolone cream (KENALOG) 0.1 % Apply topically 2 (two) times daily.     No current facility-administered medications for this encounter.   BP 90/64   Pulse 92   Wt 73 kg (161 lb)   SpO2 95%   BMI 26.79 kg/m   Wt Readings from Last 3 Encounters:  03/02/22 73 kg (161 lb)  12/15/21 69.9 kg (154 lb)  06/16/21 69.9 kg (154 lb 3.2 oz)    Physical Exam General:  NAD. No resp difficulty, walked into clinic HEENT: Normal Neck: Supple. No JVD. Carotids 2+ bilat; no bruits. No lymphadenopathy or thryomegaly appreciated. Cor: PMI nondisplaced. Regular rate & rhythm. No rubs, gallops or murmurs. Lungs: Diminished throughout Abdomen: Soft, nontender, nondistended. No hepatosplenomegaly. No bruits or masses. Good bowel sounds. Extremities: No cyanosis, clubbing, rash, edema Neuro: Alert & oriented x 3, cranial nerves grossly intact. Moves all 4 extremities w/o difficulty. Affect pleasant.  ECG (personally reviewed): NSR 88 bpm  Assessment/Plan: 1. Chronic systolic HF  - NICM, likely Adriamycin toxicity - Echo 8/14 EF 20-25% - Echo 5/17 EF 15% - Echo 1/19 EF 40-45%. - Echo 1/20 EF 55-60% - Echo 10/21 EF 50-55%  - Echo 7/22 EF 55-60% - Echo today 03/02/22 EF  50-55%, RV normal - Stable NYHA II, symptoms likely more due to COPD than HF - Volume status ok.  - Decrease Entresto to 24/26 mg bid with low BP and falls. - Continue carvedilol 6.25 mg bid.  - Continue Jardiance 25 mg daily. No GU symptoms. - Off spiro with hyperK. Will not re-challenge with soft BP and stable EF.   - Labs today.   2. CAD - Moderate nonobstructive disease. - No s/s ischemia. - Continue ASA and statin  3. COPD/tobacco use - Smoking 1 ppd.  - Encouraged cessation  4. Cocaine use - Remains abstinent  5. Breast Cancer - Previously received adriamycin.  - Cancer free for > 5 years.  No change.  6. Carotid stenosis  - Asymptomatic  - Mild 1-39% 2/20  - On statin.   7. R chest leiomyosarcoma: - Had further resections and margins now clear.  - Sees Dr. Alen Blew and Emily [name]. - No change  8. DM2 - She is on insulin. - A1c 8.9 (11/23) - Continue Jardiance.  9. Compression Fx - Would she be candidate for kyphoplasty with IR  Follow up in 9 months with Dr. Haroldine [name]  Rafael Bihari, FNP  2:33 PM

## 2022-03-02 NOTE — Progress Notes (Signed)
  Echocardiogram 2D Echocardiogram has been performed.  Emily [name] 03/02/2022, 1:33 PM

## 2022-03-02 NOTE — Patient Instructions (Signed)
Medication Changes:  DECREASE Entresto to 24/26 mg Twice daily   Lab Work:  Labs done today, your results will be available in MyChart, we will contact you for abnormal readings.  Testing/Procedures:  none  Referrals:  none  Special Instructions // Education:  Do the following things EVERYDAY: Weigh yourself in the morning before breakfast. Write it down and keep it in a log. Take your medicines as prescribed Eat low salt foods--Limit salt (sodium) to 2000 mg per day.  Stay as active as you can everyday Limit all fluids for the day to less than 2 liters   Follow-Up in: 9 months (October), **PLEASE CALL OUR OFFICE IN AUGUST TO SCHEDULE THIS APPOINTMENT  At the [facility], you and your health needs are our priority. We have a designated team specialized in the treatment of Heart Failure. This Care Team includes your primary Heart Failure Specialized Cardiologist (physician), Advanced Practice Providers (APPs- Physician Assistants and Nurse Practitioners), and Pharmacist who all work together to provide you with the care you need, when you need it.   You may see any of the following providers on your designated Care Team at your next follow up:  Dr. Glori Bickers Dr. Loralie Champagne Dr. Roxana Hires, NP Lyda Jester, [place] [facility] [place], [place] Forestine Na, NP Audry Riles, PharmD   Please be sure to bring in all your medications bottles to every appointment.   Need to Contact us:  If you have any questions or concerns before your next appointment please send [place] a message through [place] or call our office at [phone].    TO LEAVE A MESSAGE FOR THE NURSE SELECT OPTION 2, PLEASE LEAVE A MESSAGE INCLUDING: YOUR NAME DATE OF BIRTH CALL BACK NUMBER REASON FOR CALL**this is important as we prioritize the call backs  YOU WILL RECEIVE A CALL BACK THE SAME DAY AS LONG AS YOU CALL BEFORE 4:00 PM

## 2022-03-07 ENCOUNTER — Telehealth (HOSPITAL_COMMUNITY): Payer: Self-pay

## 2022-03-07 ENCOUNTER — Other Ambulatory Visit (HOSPITAL_COMMUNITY): Payer: Self-pay

## 2022-03-07 DIAGNOSIS — I5022 Chronic systolic (congestive) heart failure: Secondary | ICD-10-CM

## 2022-03-07 NOTE — Telephone Encounter (Signed)
Patient aware and agreeable. Labs ordered and scheduled. 

## 2022-03-20 ENCOUNTER — Telehealth: Payer: Self-pay | Admitting: *Deleted

## 2022-03-20 NOTE — Patient Outreach (Signed)
  Care Coordination   Initial Visit Note   03/20/2022 Name: Emily [name] MRN: [id number] DOB: [birth year]  Emily [name] is a 62 y.o. year old female who sees Emily Barrack, MD for primary care. I spoke with  Emily [name] by phone today.  What matters to the patients health and wellness today?  No needs    Goals Addressed             This Visit's Progress    COMPLETED: care coordination activity       Care Coordination Interventions: Assessed social determinant of health barriers Educated on care management services with no needs identified at this time. Member aware information will be added to chart for review on how to contact care management dept.         SDOH assessments and interventions completed:  Yes  SDOH Interventions Today    Flowsheet Row Most Recent Value  SDOH Interventions   Food Insecurity Interventions Intervention Not Indicated  Housing Interventions Intervention Not Indicated  Transportation Interventions Intervention Not Indicated  Utilities Interventions Intervention Not Indicated        Care Coordination Interventions:  Yes, provided   Follow up plan: No further intervention required.   Encounter Outcome:  Pt. Visit Completed   Raina Mina, RN Care Management Coordinator [place] Office [phone]

## 2022-03-20 NOTE — Patient Instructions (Signed)
Visit Information  Thank you for taking time to visit with me today. Please don't hesitate to contact me if I can be of assistance to you.   Following are the goals we discussed today:   Goals Addressed             This Visit's Progress    COMPLETED: care coordination activity       Care Coordination Interventions: Assessed social determinant of health barriers Educated on care management services with no needs identified at this time. Member aware information will be added to chart for review on how to contact care management dept.         Please call the care guide team at [phone] if you need to cancel or reschedule your appointment.   If you are experiencing a Mental Health or [place] or need someone to talk to, please call the Suicide and Crisis Lifeline: 988  Patient verbalizes understanding of instructions and care plan provided today and agrees to view in [place]. Active MyChart status and patient understanding of how to access instructions and care plan via MyChart confirmed with patient.     No further follow up required: No needs    Raina Mina, RN Care Management Coordinator [place] Office [phone]

## 2022-03-21 ENCOUNTER — Ambulatory Visit (HOSPITAL_COMMUNITY)
Admission: RE | Admit: 2022-03-21 | Discharge: 2022-03-21 | Disposition: A | Discharge status: Home / Self Care | Payer: Medicare Other | Source: Ambulatory Visit | Attending: Cardiology | Admitting: Cardiology

## 2022-03-21 DIAGNOSIS — I5022 Chronic systolic (congestive) heart failure: Secondary | ICD-10-CM

## 2022-03-21 LAB — BASIC METABOLIC PANEL
Anion gap: 10 (ref 5–15)
BUN: 23 mg/dL (ref 8–23)
CO2: 22 mmol/L (ref 22–32)
Calcium: 8.7 mg/dL — ABNORMAL LOW (ref 8.9–10.3)
Chloride: 102 mmol/L (ref 98–111)
Creatinine, Ser: 1.04 mg/dL — ABNORMAL HIGH (ref 0.44–1.00)
GFR, Estimated: >60 mL/min (ref >60)
Glucose, Bld: 143 mg/dL — ABNORMAL HIGH (ref 70–99)
Potassium: 4.5 mmol/L (ref 3.5–5.1)
Sodium: 134 mmol/L — ABNORMAL LOW (ref 135–145)

## 2022-04-22 ENCOUNTER — Encounter (HOSPITAL_COMMUNITY): Payer: Self-pay | Admitting: *Deleted

## 2022-04-22 ENCOUNTER — Emergency Department (HOSPITAL_COMMUNITY): Payer: Medicare Other

## 2022-04-22 ENCOUNTER — Other Ambulatory Visit: Payer: Self-pay

## 2022-04-22 ENCOUNTER — Emergency Department (HOSPITAL_COMMUNITY)
Admission: EM | Admit: 2022-04-22 | Discharge: 2022-04-22 | Disposition: A | Discharge status: Home / Self Care | Payer: Medicare Other | Attending: Emergency Medicine | Admitting: Emergency Medicine

## 2022-04-22 DIAGNOSIS — R0781 Pleurodynia: Secondary | ICD-10-CM | POA: Diagnosis present

## 2022-04-22 DIAGNOSIS — W19XXXA Unspecified fall, initial encounter: Secondary | ICD-10-CM

## 2022-04-22 DIAGNOSIS — Z794 Long term (current) use of insulin: Secondary | ICD-10-CM | POA: Diagnosis not present

## 2022-04-22 DIAGNOSIS — M25552 Pain in left hip: Secondary | ICD-10-CM | POA: Diagnosis not present

## 2022-04-22 DIAGNOSIS — W0110XA Fall on same level from slipping, tripping and stumbling with subsequent striking against unspecified object, initial encounter: Secondary | ICD-10-CM | POA: Diagnosis not present

## 2022-04-22 DIAGNOSIS — Z7982 Long term (current) use of aspirin: Secondary | ICD-10-CM | POA: Insufficient documentation

## 2022-04-22 DIAGNOSIS — S6292XA Unspecified fracture of left wrist and hand, initial encounter for closed fracture: Secondary | ICD-10-CM | POA: Insufficient documentation

## 2022-04-22 DIAGNOSIS — R0789 Other chest pain: Secondary | ICD-10-CM | POA: Diagnosis not present

## 2022-04-22 DIAGNOSIS — M25532 Pain in left wrist: Secondary | ICD-10-CM | POA: Diagnosis not present

## 2022-04-22 DIAGNOSIS — M79642 Pain in left hand: Secondary | ICD-10-CM | POA: Diagnosis not present

## 2022-04-22 DIAGNOSIS — S52615A Nondisplaced fracture of left ulna styloid process, initial encounter for closed fracture: Secondary | ICD-10-CM | POA: Diagnosis not present

## 2022-04-22 DIAGNOSIS — S52325A Nondisplaced transverse fracture of shaft of left radius, initial encounter for closed fracture: Secondary | ICD-10-CM | POA: Diagnosis not present

## 2022-04-22 DIAGNOSIS — R079 Chest pain, unspecified: Secondary | ICD-10-CM | POA: Diagnosis not present

## 2022-04-22 MED ORDER — HYDROCODONE-ACETAMINOPHEN 5-325 MG PO TABS
1.0000 | ORAL_TABLET | Freq: Once | ORAL | Status: AC
  Administered 2022-04-22: 1 via ORAL
  Filled 2022-04-22: qty 1

## 2022-04-22 MED ORDER — HYDROCODONE-ACETAMINOPHEN 5-325 MG PO TABS: 1.0000 | ORAL_TABLET | Freq: Four times a day (QID) | ORAL | 0 refills | Status: DC | PRN

## 2022-04-22 NOTE — ED Triage Notes (Signed)
BIB family from home s/p fall 3-4 days ago, hit head, denies LOC or blood thinners.c/o L wrist pain and developing L lateral lower rib cage pain. States, "I fall a lot". Rates pain 10/10. "Hurts to cough". Guarding resps, and L wrist. L wrist and hand swollen. Anatomical snuff box tenderness. No torso bruising noted. Took oxycodone this am at 0700.

## 2022-04-22 NOTE — ED Notes (Signed)
Ice to left and with pillow support

## 2022-04-22 NOTE — ED Provider Notes (Signed)
[facility] Provider Note   CSN: [id number] Arrival date & time: 04/22/22  1123     History {Add pertinent medical, surgical, social history, OB history to HPI:1} Chief Complaint  Patient presents with   Emily [name] is a 62 y.o. female.  She will couple days ago on her left chest and complains of chest pain and rib pain.  She has a history of elevated cholesterol   Fall       Home Medications Prior to Admission medications   Medication Sig Start Date End Date Taking? Authorizing Provider  HYDROcodone-acetaminophen (NORCO/VICODIN) 5-325 MG tablet Take 1 tablet by mouth every 6 (six) hours as needed for moderate pain. 04/22/22  Yes Milton Ferguson, MD  albuterol (VENTOLIN HFA) 108 (90 Base) MCG/ACT inhaler Inhale 2 puffs into the lungs every 6 (six) hours as needed for wheezing or shortness of breath. 08/11/20   [name], MD  aspirin EC 81 MG tablet Take 1 tablet (81 mg total) by mouth daily. 03/27/13   Bensimhon, Shaune Pascal, MD  atorvastatin (LIPITOR) 20 MG tablet Take 1 tablet (20 mg total) by mouth daily. 08/04/21   Bensimhon, Shaune Pascal, MD  Blood Glucose Monitoring Suppl (ONE TOUCH ULTRA MINI) w/Device KIT Use to test blood sugars daily. Dx: E11.9 12/02/20   [name], MD  carvedilol (COREG) 6.25 MG tablet TAKE 1 TABLET BY MOUTH TWICE DAILY WITH A MEAL 06/07/21   Bensimhon, Shaune Pascal, MD  citalopram (CELEXA) 40 MG tablet Take 1 tablet by mouth once daily 02/06/22   [name], MD  dextromethorphan (DELSYM) 30 MG/5ML liquid Take 5 mLs (30 mg total) by mouth 2 (two) times daily as needed for cough. 06/13/21   Johnson, Clanford L, MD  empagliflozin (JARDIANCE) 25 MG TABS tablet Take 1 tablet (25 mg total) by mouth daily before breakfast. 02/28/22   Bensimhon, Shaune Pascal, MD  glucose blood test strip Use as instructed E11.49 09/26/21   [name], MD  hydrOXYzine (ATARAX) 50 MG tablet Take 1 tablet (50 mg total) by  mouth 3 (three) times daily as needed for itching. 02/28/22   [name], MD  Insulin Degludec (TRESIBA [facility]) Inject 20 Units into the skin daily.    [provider]  ipratropium-albuterol (DUONEB) 0.5-2.5 (3) MG/3ML SOLN Take 3 mLs by nebulization every 4 (four) hours as needed (wheezing, shortness of breath, cough). 06/13/21   Johnson, Clanford L, MD  LORazepam (ATIVAN) 2 MG tablet Take 1 tablet (2 mg total) by mouth at bedtime as needed for anxiety. Do not take with pain medications. 02/28/22   [name], MD  metFORMIN (GLUCOPHAGE-XR) 500 MG 24 hr tablet Take 2 tablets (1,000 mg total) by mouth 2 (two) times daily. 12/15/21   [name], MD  montelukast (SINGULAIR) 10 MG tablet TAKE 1 TABLET BY MOUTH AT BEDTIME 01/11/22   [name], MD  nystatin cream (MYCOSTATIN) Apply topically 2 (two) times daily. 11/24/21   [provider]  oxyCODONE (ROXICODONE) 5 MG immediate release tablet Take 1 tablet (5 mg total) by mouth every 6 (six) hours as needed for severe pain. 02/28/22   [name], MD  sacubitril-valsartan (ENTRESTO) 24-26 MG Take 1 tablet by mouth 2 (two) times daily. 03/02/22   Milford, Maricela Bo, FNP  triamcinolone cream (KENALOG) 0.1 % Apply topically 2 (two) times daily. 11/24/21   [provider]  Allergies    Contrast media [iodinated contrast media], Gadolinium, and Iodine-131    Review of Systems   Review of Systems  Physical Exam Updated Vital Signs BP 122/80 (BP Location: Right Arm)   Pulse 88   Temp 98 F (36.7 C)   Resp 16   Ht '5\' 5"'$  (1.651 m)   Wt 70.8 kg   SpO2 96%   BMI 25.96 kg/m  Physical Exam  ED Results / Procedures / Treatments   Labs (all labs ordered are listed, but only abnormal results are displayed) Labs Reviewed - No data to display  EKG None  Radiology DG Ribs Unilateral W/Chest Left  Result Date: 04/22/2022 CLINICAL DATA:  Fall 3-4 days ago.  Left rib pain. EXAM: LEFT RIBS AND CHEST - 3+ VIEW  COMPARISON:  Chest radiographs 06/13/2021, bilateral rib radiographs 02/02/2020 FINDINGS: Cardiac silhouette and mediastinal contours are within normal limits. There is lateral right midlung horizontal linear subsegmental atelectasis. Unchanged mild left basilar interstitial thickening/scarring. No pleural effusion or pneumothorax. Old healed posterior fifth and sixth rib fractures. No acute displaced left-sided rib fracture is seen. IMPRESSION: 1. No acute displaced left-sided rib fracture. Old healed posterior right rib fractures. 2. Mild right midlung subsegmental atelectasis. 3. Unchanged mild left basilar interstitial thickening/scarring. Electronically Signed   By: Yvonne Kendall M.D.   On: 04/22/2022 12:33   DG Wrist Complete Left  Result Date: 04/22/2022 CLINICAL DATA:  Fall 3-4 days ago. Left wrist pain. Anatomical snuffbox tenderness. EXAM: LEFT WRIST - COMPLETE 3+ VIEW; LEFT HAND - COMPLETE 3+ VIEW COMPARISON:  None Available. FINDINGS: Left wrist: There is diffuse decreased bone mineralization. There is predominantly transverse linear lucency indicating an acute fracture of the distal radial metaphysis that extends to the medial and lateral cortices on frontal view and to the dorsal cortex on lateral view. There also connecting fracture lines that extend to the distal medial/ulnar aspect of the radial articular surface. Mild impaction and cortical buckling of the dorsal aspect of the dominant distal radial metaphyseal fracture line. Nondisplaced acute fracture of the proximal and mid to distal aspectS of the ulnar styloid. Mild radiocarpal chondrocalcinosis. Mild triscaphe and thumb carpometacarpal joint space narrowing. -- Left hand: Diffuse decreased bone mineralization. Mild interphalangeal joint space narrowing diffusely. No acute fracture is seen. No dislocation. IMPRESSION: 1. Acute nondisplaced fracture of the distal radial metaphysis and epiphysis with intra-articular extension. 2. Acute  nondisplaced fracture of the ulnar styloid. Electronically Signed   By: Yvonne Kendall M.D.   On: 04/22/2022 12:28   DG Hand Complete Left  Result Date: 04/22/2022 CLINICAL DATA:  Fall 3-4 days ago. Left wrist pain. Anatomical snuffbox tenderness. EXAM: LEFT WRIST - COMPLETE 3+ VIEW; LEFT HAND - COMPLETE 3+ VIEW COMPARISON:  None Available. FINDINGS: Left wrist: There is diffuse decreased bone mineralization. There is predominantly transverse linear lucency indicating an acute fracture of the distal radial metaphysis that extends to the medial and lateral cortices on frontal view and to the dorsal cortex on lateral view. There also connecting fracture lines that extend to the distal medial/ulnar aspect of the radial articular surface. Mild impaction and cortical buckling of the dorsal aspect of the dominant distal radial metaphyseal fracture line. Nondisplaced acute fracture of the proximal and mid to distal aspectS of the ulnar styloid. Mild radiocarpal chondrocalcinosis. Mild triscaphe and thumb carpometacarpal joint space narrowing. -- Left hand: Diffuse decreased bone mineralization. Mild interphalangeal joint space narrowing diffusely. No acute fracture is seen. No dislocation. IMPRESSION: 1. Acute nondisplaced  fracture of the distal radial metaphysis and epiphysis with intra-articular extension. 2. Acute nondisplaced fracture of the ulnar styloid. Electronically Signed   By: Yvonne Kendall M.D.   On: 04/22/2022 12:28    Procedures Procedures  {Document cardiac monitor, telemetry assessment procedure when appropriate:1}  Medications Ordered in ED Medications  HYDROcodone-acetaminophen (NORCO/VICODIN) 5-325 MG per tablet 1 tablet (1 tablet Oral Given 04/22/22 1452)    ED Course/ Medical Decision Making/ A&P   {   Click here for ABCD2, HEART and other calculatorsREFRESH Note before signing :1}                          Medical Decision Making Amount and/or Complexity of Data Reviewed Radiology:  ordered.  Risk Prescription drug management.   Patient with contusion to left chest and fractures to the left wrist.  She is put in a sugar-tong splint and given pain medicine will follow-up with Ortho  {Document critical care time when appropriate:1} {Document review of labs and clinical decision tools ie heart score, Chads2Vasc2 etc:1}  {Document your independent review of radiology images, and any outside records:1} {Document your discussion with family members, caretakers, and with consultants:1} {Document social determinants of health affecting pt's care:1} {Document your decision making why or why not admission, treatments were needed:1} Final Clinical Impression(s) / ED Diagnoses Final diagnoses:  Fall, initial encounter    Rx / DC Orders ED Discharge Orders          Ordered    HYDROcodone-acetaminophen (NORCO/VICODIN) 5-325 MG tablet  Every 6 hours PRN        04/22/22 1445

## 2022-04-22 NOTE — ED Notes (Signed)
Patient had 2 silver colored ring cut from left hand, patient requested rings to be thrown away.

## 2022-04-22 NOTE — Discharge Instructions (Addendum)
Keep your arm elevated.  Follow-up with Dr. Aline Brochure or one of his colleagues next week

## 2022-04-25 ENCOUNTER — Ambulatory Visit: Payer: Medicare Other | Admitting: Orthopedic Surgery

## 2022-04-25 ENCOUNTER — Telehealth: Payer: Self-pay | Admitting: Orthopedic Surgery

## 2022-04-25 ENCOUNTER — Encounter: Payer: Self-pay | Admitting: Orthopedic Surgery

## 2022-04-25 DIAGNOSIS — S52532A Colles' fracture of left radius, initial encounter for closed fracture: Secondary | ICD-10-CM | POA: Diagnosis not present

## 2022-04-25 MED ORDER — OXYCODONE-ACETAMINOPHEN 5-325 MG PO TABS: 1.0000 | ORAL_TABLET | ORAL | 0 refills | Status: DC | PRN

## 2022-04-25 NOTE — Telephone Encounter (Signed)
Oxycodone was sent at 12 PM today by Dr. Sharol Given to [facility] in [place].

## 2022-04-25 NOTE — Telephone Encounter (Signed)
Patient waiting on an RX for pain being sent to [facility] in [place], [place]
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
## 2022-04-25 NOTE — Telephone Encounter (Signed)
Left msg on VM that this was sent.

## 2022-04-27 ENCOUNTER — Telehealth: Payer: Self-pay | Admitting: *Deleted

## 2022-04-27 NOTE — Telephone Encounter (Signed)
        Patient  visited [facility] on 04/22/2022  for fall   Telephone encounter attempt :  1st  Phone disconnected   [place] [phone] [address] , [place] [address] Email : [email or web address]

## 2022-05-01 ENCOUNTER — Telehealth: Payer: Self-pay | Admitting: *Deleted

## 2022-05-01 NOTE — Telephone Encounter (Signed)
         Patient  visited [facility] on 04/22/2022  for fall   Telephone encounter attempt :  2nd  A HIPAA compliant voice message was left requesting a return call.  Instructed patient to call back at [phone].  [facility] [phone] [address] , [place] [address] Email : [email or web address]

## 2022-05-03 ENCOUNTER — Other Ambulatory Visit: Payer: Self-pay | Admitting: Family Medicine

## 2022-05-10 ENCOUNTER — Ambulatory Visit: Payer: Medicare Other | Admitting: Family

## 2022-05-10 ENCOUNTER — Ambulatory Visit (INDEPENDENT_AMBULATORY_CARE_PROVIDER_SITE_OTHER): Payer: Medicare Other

## 2022-05-10 ENCOUNTER — Encounter: Payer: Self-pay | Admitting: Family

## 2022-05-10 DIAGNOSIS — M25532 Pain in left wrist: Secondary | ICD-10-CM | POA: Diagnosis not present

## 2022-05-10 DIAGNOSIS — S52532D Colles' fracture of left radius, subsequent encounter for closed fracture with routine healing: Secondary | ICD-10-CM

## 2022-05-10 MED ORDER — OXYCODONE-ACETAMINOPHEN 5-325 MG PO TABS: 1.0000 | ORAL_TABLET | Freq: Four times a day (QID) | ORAL | 0 refills | Status: DC | PRN

## 2022-05-10 MED ORDER — LIDOCAINE 5 % EX PTCH: 1.0000 | MEDICATED_PATCH | CUTANEOUS | 0 refills | Status: DC
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
## 2022-05-15 ENCOUNTER — Ambulatory Visit: Payer: Medicare Other | Admitting: Orthopedic Surgery

## 2022-05-27 ENCOUNTER — Other Ambulatory Visit: Payer: Self-pay | Admitting: Family Medicine

## 2022-05-29 ENCOUNTER — Other Ambulatory Visit: Payer: Self-pay | Admitting: Family Medicine

## 2022-05-29 NOTE — Telephone Encounter (Signed)
Last OV: 12/15/21  Next OV: 09/29/22  Last Filled: 02/28/22  Quantity: 90 tab w/ 0 refills

## 2022-05-30 MED ORDER — LORAZEPAM 2 MG PO TABS: 2.0000 mg | ORAL_TABLET | Freq: Every evening | ORAL | 0 refills | Status: DC | PRN

## 2022-06-01 ENCOUNTER — Other Ambulatory Visit (INDEPENDENT_AMBULATORY_CARE_PROVIDER_SITE_OTHER): Payer: Medicare Other

## 2022-06-01 ENCOUNTER — Ambulatory Visit: Payer: Medicare Other | Admitting: Orthopedic Surgery

## 2022-06-01 DIAGNOSIS — S52532A Colles' fracture of left radius, initial encounter for closed fracture: Secondary | ICD-10-CM

## 2022-06-01 MED ORDER — OXYCODONE-ACETAMINOPHEN 5-325 MG PO TABS: 1.0000 | ORAL_TABLET | Freq: Four times a day (QID) | ORAL | 0 refills | Status: DC | PRN

## 2022-06-02 ENCOUNTER — Encounter: Payer: Self-pay | Admitting: Orthopedic Surgery
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
## 2022-07-03 ENCOUNTER — Ambulatory Visit: Payer: Medicare Other | Admitting: Orthopedic Surgery

## 2022-07-26 ENCOUNTER — Telehealth: Payer: Self-pay | Admitting: Pharmacist

## 2022-07-26 DIAGNOSIS — J449 Chronic obstructive pulmonary disease, unspecified: Secondary | ICD-10-CM

## 2022-07-26 NOTE — Telephone Encounter (Signed)
This patient has been identified as "high risk" and in the top 25% of risk stratification for [facility] accountable patients.  These patients were identified using a number of factors including # of hospitalizations, ED visits, HF exacerbations, elevated BP and A1c, and overall cost of care.   Referral placed for cosign by the PCP.  [facility] team to schedule once cosigned.  Hollyann Pablo, PharmD Clinical Pharmacist  [facility] [facility] [phone]  

## 2022-07-29 ENCOUNTER — Other Ambulatory Visit (HOSPITAL_COMMUNITY): Payer: Self-pay | Admitting: Internal Medicine

## 2022-07-29 DIAGNOSIS — E785 Hyperlipidemia, unspecified: Secondary | ICD-10-CM

## 2022-08-21 ENCOUNTER — Telehealth: Payer: Self-pay | Admitting: Family Medicine

## 2022-08-21 NOTE — Telephone Encounter (Signed)
FYI: This call has been transferred to Access Nurse. Once the result note has been entered staff can address the message at that time.  Patient called in with the following symptoms:  Red Word:abdominal pain   Please advise at Mobile [phone] (mobile)  Message is routed to Provider Pool and [facility] Triage

## 2022-08-21 NOTE — Telephone Encounter (Signed)
Spoke with patient, denied ED visit  Stated will keep appt with PCP tomorrow

## 2022-08-21 NOTE — Telephone Encounter (Signed)
Left message to return call to our office at their convenience.  

## 2022-08-21 NOTE — Telephone Encounter (Signed)
Patient Advised Go to ED Now-no Transportation   Patient Name First: Emily Last: [name] Gender: Female DOB: 1960/12/08 Age: 62 Y 16 D Return Phone Number: [phone] (Primary) Address: City/ State/ Zip: [place] [address] Client [facility] - Administrator, [facility] at [facility] Provider Jacquiline Doe- MD Contact Type Call Who Is Calling Patient / Member / Family / Caregiver Call Type Triage / Clinical Relationship To Patient Self Return Phone Number [phone] (Primary) Chief Complaint SEVERE ABDOMINAL PAIN - Severe pain in abdomen Reason for Call Symptomatic / Request for Health Information Initial Comment Caller states that she has severe abdominal and back pain that started 4-5 days ago. She is taking Advil, but it is not helping. She is only taking in liquids. Translation No Nurse Assessment Nurse: Clarita Leber, RN, Deborah Date/Time (Eastern Time): 08/21/2022 9:01:39 AM Confirm and document reason for call. If symptomatic, describe symptoms. ---Caller states that she is having pain in the abdomen mid portion radiating to the back. Hx pancreatitis. Does the patient have any new or worsening symptoms? ---Yes Will a triage be completed? ---Yes Related visit to physician within the last 2 weeks? ---No Does the PT have any chronic conditions? (i.e. diabetes, asthma, this includes High risk factors for pregnancy, etc.) ---Yes List chronic conditions. ---Pancreatitits, CHF, DM Is this a behavioral health or substance abuse call? ---No Guidelines Guideline Title Affirmed Question Affirmed Notes Nurse Date/Time (Eastern Time) Abdominal Pain - Female [1] SEVERE pain (e.g., excruciating) AND [2] present > 1 hour Womble, RN, Gavin Pound 08/21/2022 9:03:59 AM  Disp. Time Lamount Cohen Time) Disposition Final User 08/21/2022 8:58:08 AM Send to Urgent Queue Brooke Pace 08/21/2022 9:06:53 AM Go to ED Now Yes Clarita Leber,  RN, Gavin Pound Final Disposition 08/21/2022 9:06:53 AM Go to ED Now Yes Clarita Leber, RN, Jetty Duhamel Disagree/Comply Disagree Caller Understands Yes PreDisposition Call Doctor Care Advice Given Per Guideline * You need to be seen in the Emergency Department.  Comments User: Alita Chyle, RN Date/Time Lamount Cohen Time): 08/21/2022 9:08:30 AM Caller states that she does not have transporation today. This nurse advised her to call 911. She states that she has an appointment in the morning at the office. This nurse informed her that if she worsens to please call 911 or she can call back any time. Has DM but has not checked blood glucose. Referrals GO TO FACILITY REFUSED

## 2022-08-21 NOTE — Telephone Encounter (Signed)
Agree with her going to the Emergency Department.  Emily [name]. Jimmey Ralph, MD 08/21/2022 10:30 AM

## 2022-08-22 ENCOUNTER — Ambulatory Visit (INDEPENDENT_AMBULATORY_CARE_PROVIDER_SITE_OTHER): Payer: Medicare Other | Admitting: Family Medicine

## 2022-08-22 ENCOUNTER — Encounter: Payer: Self-pay | Admitting: Family Medicine

## 2022-08-22 VITALS — BP 119/80 | HR 88 | Temp 98.0°F | Ht 65.0 in | Wt 149.0 lb

## 2022-08-22 DIAGNOSIS — F321 Major depressive disorder, single episode, moderate: Secondary | ICD-10-CM

## 2022-08-22 DIAGNOSIS — I152 Hypertension secondary to endocrine disorders: Secondary | ICD-10-CM

## 2022-08-22 DIAGNOSIS — S22050A Wedge compression fracture of T5-T6 vertebra, initial encounter for closed fracture: Secondary | ICD-10-CM

## 2022-08-22 DIAGNOSIS — Z7984 Long term (current) use of oral hypoglycemic drugs: Secondary | ICD-10-CM | POA: Diagnosis not present

## 2022-08-22 DIAGNOSIS — G2581 Restless legs syndrome: Secondary | ICD-10-CM | POA: Insufficient documentation

## 2022-08-22 DIAGNOSIS — R3911 Hesitancy of micturition: Secondary | ICD-10-CM | POA: Diagnosis not present

## 2022-08-22 DIAGNOSIS — E1159 Type 2 diabetes mellitus with other circulatory complications: Secondary | ICD-10-CM

## 2022-08-22 DIAGNOSIS — K861 Other chronic pancreatitis: Secondary | ICD-10-CM | POA: Diagnosis not present

## 2022-08-22 DIAGNOSIS — Z1211 Encounter for screening for malignant neoplasm of colon: Secondary | ICD-10-CM

## 2022-08-22 DIAGNOSIS — E1149 Type 2 diabetes mellitus with other diabetic neurological complication: Secondary | ICD-10-CM | POA: Diagnosis not present

## 2022-08-22 DIAGNOSIS — R109 Unspecified abdominal pain: Secondary | ICD-10-CM

## 2022-08-22 LAB — CBC
HCT: 45.9 % (ref 36.0–46.0)
Hemoglobin: 14.9 g/dL (ref 12.0–15.0)
MCHC: 32.6 g/dL (ref 30.0–36.0)
MCV: 86.5 fl (ref 78.0–100.0)
Platelets: 179 10*3/uL (ref 150.0–400.0)
RBC: 5.3 Mil/uL — ABNORMAL HIGH (ref 3.87–5.11)
RDW: 13.8 % (ref 11.5–15.5)
WBC: 8.5 10*3/uL (ref 4.0–10.5)

## 2022-08-22 LAB — COMPREHENSIVE METABOLIC PANEL
ALT: 10 U/L (ref 0–35)
AST: 15 U/L (ref 0–37)
Albumin: 4.1 g/dL (ref 3.5–5.2)
Alkaline Phosphatase: 62 U/L (ref 39–117)
BUN: 19 mg/dL (ref 6–23)
CO2: 26 mEq/L (ref 19–32)
Calcium: 9.3 mg/dL (ref 8.4–10.5)
Chloride: 101 mEq/L (ref 96–112)
Creatinine, Ser: 1.2 mg/dL (ref 0.40–1.20)
GFR: 47.99 mL/min — ABNORMAL LOW (ref >60.00)
Glucose, Bld: 168 mg/dL — ABNORMAL HIGH (ref 70–99)
Potassium: 4.5 mEq/L (ref 3.5–5.1)
Sodium: 137 mEq/L (ref 135–145)
Total Bilirubin: 0.6 mg/dL (ref 0.2–1.2)
Total Protein: 6.8 g/dL (ref 6.0–8.3)

## 2022-08-22 LAB — URINALYSIS, ROUTINE W REFLEX MICROSCOPIC
Bilirubin Urine: NEGATIVE
Hgb urine dipstick: NEGATIVE
Ketones, ur: NEGATIVE
Leukocytes,Ua: NEGATIVE
Nitrite: NEGATIVE
RBC / HPF: NONE SEEN (ref 0–?)
Specific Gravity, Urine: 1.015 (ref 1.000–1.030)
Total Protein, Urine: NEGATIVE
Urine Glucose: >=1000 — AB
Urobilinogen, UA: 0.2 (ref 0.0–1.0)
pH: 5.5 (ref 5.0–8.0)

## 2022-08-22 LAB — IBC + FERRITIN
Ferritin: 26.5 ng/mL (ref 10.0–291.0)
Iron: 105 ug/dL (ref 42–145)
Saturation Ratios: 30 % (ref 20.0–50.0)
TIBC: 350 ug/dL (ref 250.0–450.0)
Transferrin: 250 mg/dL (ref 212.0–360.0)

## 2022-08-22 LAB — LIPASE: Lipase: 31 U/L (ref 11.0–59.0)

## 2022-08-22 LAB — HEMOGLOBIN A1C: Hgb A1c MFr Bld: 9.1 % — ABNORMAL HIGH (ref 4.6–6.5)

## 2022-08-22 MED ORDER — OXYCODONE HCL 5 MG PO TABS: 5.0000 mg | ORAL_TABLET | Freq: Four times a day (QID) | ORAL | 0 refills | Status: DC | PRN

## 2022-08-22 MED ORDER — BUSPIRONE HCL 5 MG PO TABS: 5.0000 mg | ORAL_TABLET | Freq: Two times a day (BID) | ORAL | 3 refills | Status: DC

## 2022-08-22 NOTE — Assessment & Plan Note (Signed)
At goal on rest of 49-51 twice daily and Coreg 6.25 mg twice daily per cardiology.

## 2022-08-22 NOTE — Assessment & Plan Note (Signed)
Longstanding issue.  She has tried gabapentin and Lyrica in the past without much improvement.  We did discuss potential trial of Requip however we will defer this today as we are making other medication changes today as above.  Will check labs today including iron panel.  If symptoms persist and labs are normal would consider trial of low-dose Requip at night.

## 2022-08-22 NOTE — Assessment & Plan Note (Signed)
She stopped her tresiba due to cost.  Now on metformin 1000 mg twice daily and Jardiance 25 mg daily.  Check A1c today.

## 2022-08-22 NOTE — Patient Instructions (Addendum)
It was very nice to see you today!  Will check blood work today.  I will refill your pain medications.  Please try the BuSpar to help with your mood.  We will order your Cologuard.   Let us know if not improving.   Return in about 3 months (around 11/22/2022).   Take care, Dr Jimmey Ralph  PLEASE NOTE:  If you had any lab tests, please let us know if you have not heard back within a few days. You may see your results on mychart before we have a chance to review them but we will give you a call once they are reviewed by [place].   If we ordered any referrals today, please let us know if you have not heard from their office within the next week.   If you had any urgent prescriptions sent in today, please check with the pharmacy within an hour of our visit to make sure the prescription was transmitted appropriately.   Please try these tips to maintain a healthy lifestyle:  Eat at least 3 REAL meals and 1-2 snacks per day.  Aim for no more than 5 hours between eating.  If you eat breakfast, please do so within one hour of getting up.   Each meal should contain half fruits/vegetables, one quarter protein, and one quarter carbs (no bigger than a computer mouse)  Cut down on sweet beverages. This includes juice, soda, and sweet tea.   Drink at least 1 glass of water with each meal and aim for at least 8 glasses per day  Exercise at least 150 minutes every week.

## 2022-08-22 NOTE — Progress Notes (Signed)
Emily [name] is a 62 y.o. female who presents today for an office visit.  Assessment/Plan:  New/Acute Problems: Urinary Hesitancy Check UA and urine culture.  Would consider referral to urology if symptoms persist and workup is normal.  Abdominal Pain Likely recurrent pancreatitis.  She does have mild tenderness on exam however no peritoneal signs.  Will check labs today including CBC, c-Met, and lipase.  She has had some improvement with conservative management at home and she will continue doing this.  We will give a small supply of oxycodone today as well.  She has not had this filled within the last several months.  Encouraged hydration.  Given improvement and reassuring exam today do not think we need to check imaging however symptoms do not improve or if she has any development of new symptoms we will obtain imaging at that point.  We discussed reasons to return to care or seek emergent care.  Chronic Problems Addressed Today: Chronic recurrent pancreatitis [facility]) Patient with likely flareup of recurrent pancreatitis over the last week or so.  Will be checking labs today as above.  She has been abstinent from alcohol and has no obvious precipitating events for most recent flare.  If this becomes more of a recurrent issue will need to follow-up with GI.  Moderate single current episode of major depressive disorder (HCC) She has been under more stress lately.  Overall feels like Celexa is effective however she may be interested in changing medications.  She is having more anxiety symptoms as well.  She did not do well with Wellbutrin in the past.  Also did not do well with Abilify.  Will try BuSpar 5 mg twice daily.  She can follow-up with [place] in a few weeks via MyChart.  Not interested in referral to psychiatry or psychology at this point.  Type 2 diabetes mellitus with neurological complications (HCC) She stopped her tresiba due to cost.  Now on metformin 1000 mg twice daily and  Jardiance 25 mg daily.  Check A1c today.  Hypertension associated with diabetes (HCC) At goal on rest of 49-51 twice daily and Coreg 6.25 mg twice daily per cardiology.  Chronic pain secondary to Thoracic compression fracture (HCC) With no red flags.  She has been tolerating oxycodone well.  She has not had this refilled for the last several months.  Will be refilling today.  Restless legs Longstanding issue.  She has tried gabapentin and Lyrica in the past without much improvement.  We did discuss potential trial of Requip however we will defer this today as we are making other medication changes today as above.  Will check labs today including iron panel.  If symptoms persist and labs are normal would consider trial of low-dose Requip at night.     Subjective:  HPI:  See A/P for status of chronic conditions.  Patient is here today with abdominal pain.  She is concerned about recurrent pancreatitis.  This started about week ago. Pain comes and goes. Much worse after eating. Trying liquid and bland diet. Some nausea no vomiting. Some diarrhea that turned into constipation. No other treatments tried. Her last alcoholic drink was about 8-10 months ago. No obvious triggering events. Getting better the last couple of days with her conservative management.  Pain is similar to previous episodes of pancreatitis.  This last happened about 3 years ago.   She is also having some urinary hesitancy the last several weeks. No fevers or chills. No dysuria. No hematuria. Some  urgency.        Objective:  Physical Exam: BP 119/80   Pulse 88   Temp 98 F (36.7 C) (Temporal)   Ht 5\' 5"  (1.651 m)   Wt 149 lb (67.6 kg)   SpO2 96%   BMI 24.79 kg/m   Gen: No acute distress, resting comfortably CV: Regular rate and rhythm with no murmurs appreciated Pulm: Normal work of breathing, clear to auscultation bilaterally with no crackles, wheezes, or rhonchi Abdomen: Mild distention.  Mild tenderness in  epigastric area.  No rebound or guarding.  Bowel sounds present. Neuro: Grossly normal, moves all extremities Psych: Normal affect and thought content      Orlin Kann M. Jimmey Ralph, MD 08/22/2022 9:33 AM

## 2022-08-22 NOTE — Assessment & Plan Note (Signed)
Patient with likely flareup of recurrent pancreatitis over the last week or so.  Will be checking labs today as above.  She has been abstinent from alcohol and has no obvious precipitating events for most recent flare.  If this becomes more of a recurrent issue will need to follow-up with GI.

## 2022-08-22 NOTE — Assessment & Plan Note (Signed)
With no red flags.  She has been tolerating oxycodone well.  She has not had this refilled for the last several months.  Will be refilling today.

## 2022-08-22 NOTE — Assessment & Plan Note (Signed)
She has been under more stress lately.  Overall feels like Celexa is effective however she may be interested in changing medications.  She is having more anxiety symptoms as well.  She did not do well with Wellbutrin in the past.  Also did not do well with Abilify.  Will try BuSpar 5 mg twice daily.  She can follow-up with [place] in a few weeks via MyChart.  Not interested in referral to psychiatry or psychology at this point.

## 2022-08-23 LAB — URINE CULTURE
MICRO NUMBER:: 15124942
SPECIMEN QUALITY:: ADEQUATE

## 2022-08-24 ENCOUNTER — Telehealth: Payer: Self-pay | Admitting: *Deleted

## 2022-08-24 ENCOUNTER — Telehealth: Payer: Self-pay | Admitting: Family Medicine

## 2022-08-24 ENCOUNTER — Other Ambulatory Visit: Payer: Self-pay | Admitting: *Deleted

## 2022-08-24 DIAGNOSIS — N1831 Chronic kidney disease, stage 3a: Secondary | ICD-10-CM

## 2022-08-24 DIAGNOSIS — R3911 Hesitancy of micturition: Secondary | ICD-10-CM

## 2022-08-24 NOTE — Telephone Encounter (Signed)
Pt would like a call back with lab results 

## 2022-08-24 NOTE — Telephone Encounter (Signed)
Please see result note.  Emily [name]. Jimmey Ralph, MD 08/24/2022 12:59 PM

## 2022-08-24 NOTE — Progress Notes (Signed)
Her urine sample is negative for infection.  Recommend referral to urology if you still having issues with hesitancy.  Her kidney numbers were up just a little bit.  This may be due to dehydration.  Recommend she come back in 1 to 2 weeks to recheck be met.  Please place future order.  Her A1c is 9.1.  We need to try to get this closer to 7.  She is on full dose of the metformin and Jardiance.  We probably need to get her back on insulin.  Please have her schedule appointment to discuss.  Her pancreas levels are normal.  Iron count is normal.  She should let us know if her pain is not improving.

## 2022-08-24 NOTE — Telephone Encounter (Signed)
Spoke to pt told her discussed her pain with Dr. Jimmey Ralph and he said that since your pain is worse in your back and abdomen you to need to go the ED to be evaluated. Pt verbalized understanding.

## 2022-08-24 NOTE — Telephone Encounter (Signed)
See result notes. 

## 2022-08-25 ENCOUNTER — Observation Stay (HOSPITAL_COMMUNITY)
Admission: EM | Admit: 2022-08-25 | Discharge: 2022-08-26 | Disposition: A | Discharge status: Home / Self Care | Payer: Medicare Other | Attending: Internal Medicine | Admitting: Internal Medicine

## 2022-08-25 ENCOUNTER — Encounter (HOSPITAL_COMMUNITY): Payer: Self-pay | Admitting: Emergency Medicine

## 2022-08-25 ENCOUNTER — Other Ambulatory Visit: Payer: Self-pay

## 2022-08-25 ENCOUNTER — Emergency Department (HOSPITAL_COMMUNITY): Payer: Medicare Other

## 2022-08-25 DIAGNOSIS — S2249XA Multiple fractures of ribs, unspecified side, initial encounter for closed fracture: Secondary | ICD-10-CM | POA: Diagnosis present

## 2022-08-25 DIAGNOSIS — E1140 Type 2 diabetes mellitus with diabetic neuropathy, unspecified: Secondary | ICD-10-CM | POA: Insufficient documentation

## 2022-08-25 DIAGNOSIS — J9601 Acute respiratory failure with hypoxia: Secondary | ICD-10-CM | POA: Diagnosis present

## 2022-08-25 DIAGNOSIS — R110 Nausea: Secondary | ICD-10-CM | POA: Diagnosis present

## 2022-08-25 DIAGNOSIS — F17210 Nicotine dependence, cigarettes, uncomplicated: Secondary | ICD-10-CM | POA: Insufficient documentation

## 2022-08-25 DIAGNOSIS — R1010 Upper abdominal pain, unspecified: Secondary | ICD-10-CM | POA: Diagnosis not present

## 2022-08-25 DIAGNOSIS — Z7984 Long term (current) use of oral hypoglycemic drugs: Secondary | ICD-10-CM | POA: Diagnosis not present

## 2022-08-25 DIAGNOSIS — R7401 Elevation of levels of liver transaminase levels: Secondary | ICD-10-CM | POA: Insufficient documentation

## 2022-08-25 DIAGNOSIS — J441 Chronic obstructive pulmonary disease with (acute) exacerbation: Secondary | ICD-10-CM | POA: Diagnosis not present

## 2022-08-25 DIAGNOSIS — R10812 Left upper quadrant abdominal tenderness: Secondary | ICD-10-CM | POA: Diagnosis not present

## 2022-08-25 DIAGNOSIS — K458 Other specified abdominal hernia without obstruction or gangrene: Secondary | ICD-10-CM | POA: Diagnosis not present

## 2022-08-25 DIAGNOSIS — K861 Other chronic pancreatitis: Secondary | ICD-10-CM | POA: Diagnosis not present

## 2022-08-25 DIAGNOSIS — Z9049 Acquired absence of other specified parts of digestive tract: Secondary | ICD-10-CM | POA: Diagnosis not present

## 2022-08-25 DIAGNOSIS — K760 Fatty (change of) liver, not elsewhere classified: Secondary | ICD-10-CM | POA: Diagnosis not present

## 2022-08-25 DIAGNOSIS — Z7982 Long term (current) use of aspirin: Secondary | ICD-10-CM | POA: Diagnosis not present

## 2022-08-25 DIAGNOSIS — Z79899 Other long term (current) drug therapy: Secondary | ICD-10-CM | POA: Diagnosis not present

## 2022-08-25 DIAGNOSIS — R748 Abnormal levels of other serum enzymes: Secondary | ICD-10-CM | POA: Diagnosis not present

## 2022-08-25 DIAGNOSIS — I110 Hypertensive heart disease with heart failure: Secondary | ICD-10-CM | POA: Insufficient documentation

## 2022-08-25 DIAGNOSIS — I5042 Chronic combined systolic (congestive) and diastolic (congestive) heart failure: Secondary | ICD-10-CM | POA: Diagnosis present

## 2022-08-25 DIAGNOSIS — I700 Atherosclerosis of aorta: Secondary | ICD-10-CM | POA: Diagnosis not present

## 2022-08-25 DIAGNOSIS — K439 Ventral hernia without obstruction or gangrene: Secondary | ICD-10-CM | POA: Diagnosis not present

## 2022-08-25 DIAGNOSIS — Z794 Long term (current) use of insulin: Secondary | ICD-10-CM | POA: Diagnosis not present

## 2022-08-25 DIAGNOSIS — E782 Mixed hyperlipidemia: Secondary | ICD-10-CM | POA: Diagnosis not present

## 2022-08-25 DIAGNOSIS — R0602 Shortness of breath: Secondary | ICD-10-CM | POA: Diagnosis not present

## 2022-08-25 DIAGNOSIS — Z853 Personal history of malignant neoplasm of breast: Secondary | ICD-10-CM | POA: Diagnosis not present

## 2022-08-25 DIAGNOSIS — I10 Essential (primary) hypertension: Secondary | ICD-10-CM | POA: Diagnosis not present

## 2022-08-25 DIAGNOSIS — Z85828 Personal history of other malignant neoplasm of skin: Secondary | ICD-10-CM | POA: Diagnosis not present

## 2022-08-25 DIAGNOSIS — K219 Gastro-esophageal reflux disease without esophagitis: Secondary | ICD-10-CM

## 2022-08-25 DIAGNOSIS — K859 Acute pancreatitis without necrosis or infection, unspecified: Secondary | ICD-10-CM | POA: Diagnosis not present

## 2022-08-25 DIAGNOSIS — R0902 Hypoxemia: Secondary | ICD-10-CM | POA: Diagnosis not present

## 2022-08-25 DIAGNOSIS — E1165 Type 2 diabetes mellitus with hyperglycemia: Secondary | ICD-10-CM | POA: Insufficient documentation

## 2022-08-25 DIAGNOSIS — Z720 Tobacco use: Secondary | ICD-10-CM | POA: Insufficient documentation

## 2022-08-25 LAB — CBC
HCT: 44.7 % (ref 36.0–46.0)
Hemoglobin: 14.6 g/dL (ref 12.0–15.0)
MCH: 28.5 pg (ref 26.0–34.0)
MCHC: 32.7 g/dL (ref 30.0–36.0)
MCV: 87.3 fL (ref 80.0–100.0)
Platelets: 176 10*3/uL (ref 150–400)
RBC: 5.12 MIL/uL — ABNORMAL HIGH (ref 3.87–5.11)
RDW: 13.2 % (ref 11.5–15.5)
WBC: 6.7 10*3/uL (ref 4.0–10.5)
nRBC: 0 % (ref 0.0–0.2)

## 2022-08-25 LAB — URINALYSIS, ROUTINE W REFLEX MICROSCOPIC
Bacteria, UA: NONE SEEN
Bilirubin Urine: NEGATIVE
Glucose, UA: >=500 mg/dL — AB
Hgb urine dipstick: NEGATIVE
Ketones, ur: NEGATIVE mg/dL
Leukocytes,Ua: NEGATIVE
Nitrite: NEGATIVE
Protein, ur: NEGATIVE mg/dL
Specific Gravity, Urine: 1.021 (ref 1.005–1.030)
pH: 5 (ref 5.0–8.0)

## 2022-08-25 LAB — COMPREHENSIVE METABOLIC PANEL
ALT: 241 U/L — ABNORMAL HIGH (ref 0–44)
AST: 282 U/L — ABNORMAL HIGH (ref 15–41)
Albumin: 3.8 g/dL (ref 3.5–5.0)
Alkaline Phosphatase: 86 U/L (ref 38–126)
Anion gap: 11 (ref 5–15)
BUN: 12 mg/dL (ref 8–23)
CO2: 25 mmol/L (ref 22–32)
Calcium: 8.8 mg/dL — ABNORMAL LOW (ref 8.9–10.3)
Chloride: 99 mmol/L (ref 98–111)
Creatinine, Ser: 1.1 mg/dL — ABNORMAL HIGH (ref 0.44–1.00)
GFR, Estimated: 56 mL/min — ABNORMAL LOW (ref >60)
Glucose, Bld: 206 mg/dL — ABNORMAL HIGH (ref 70–99)
Potassium: 4 mmol/L (ref 3.5–5.1)
Sodium: 135 mmol/L (ref 135–145)
Total Bilirubin: 0.7 mg/dL (ref 0.3–1.2)
Total Protein: 7.3 g/dL (ref 6.5–8.1)

## 2022-08-25 LAB — CBG MONITORING, ED: Glucose-Capillary: 134 mg/dL — ABNORMAL HIGH (ref 70–99)

## 2022-08-25 LAB — LIPASE, BLOOD: Lipase: 30 U/L (ref 11–51)

## 2022-08-25 LAB — GLUCOSE, CAPILLARY: Glucose-Capillary: 323 mg/dL — ABNORMAL HIGH (ref 70–99)

## 2022-08-25 MED ORDER — IPRATROPIUM-ALBUTEROL 0.5-2.5 (3) MG/3ML IN SOLN
3.0000 mL | Freq: Once | RESPIRATORY_TRACT | Status: AC
  Administered 2022-08-25: 3 mL via RESPIRATORY_TRACT
  Filled 2022-08-25: qty 3

## 2022-08-25 MED ORDER — INSULIN ASPART 100 UNIT/ML IJ SOLN
0.0000 [IU] | Freq: Every day | INTRAMUSCULAR | Status: DC
  Administered 2022-08-25: 4 [IU] via SUBCUTANEOUS

## 2022-08-25 MED ORDER — ONDANSETRON HCL 4 MG/2ML IJ SOLN
4.0000 mg | Freq: Once | INTRAMUSCULAR | Status: AC
  Administered 2022-08-25: 4 mg via INTRAVENOUS
  Filled 2022-08-25: qty 2

## 2022-08-25 MED ORDER — PANTOPRAZOLE SODIUM 40 MG PO TBEC
40.0000 mg | DELAYED_RELEASE_TABLET | Freq: Every day | ORAL | Status: DC
  Administered 2022-08-25 – 2022-08-26 (×2): 40 mg via ORAL
  Filled 2022-08-25 (×2): qty 1

## 2022-08-25 MED ORDER — OXYCODONE-ACETAMINOPHEN 5-325 MG PO TABS
1.0000 | ORAL_TABLET | Freq: Once | ORAL | Status: AC
  Administered 2022-08-25: 1 via ORAL
  Filled 2022-08-25: qty 1

## 2022-08-25 MED ORDER — FENTANYL CITRATE PF 50 MCG/ML IJ SOSY
50.0000 ug | PREFILLED_SYRINGE | Freq: Once | INTRAMUSCULAR | Status: AC
  Administered 2022-08-25: 50 ug via INTRAVENOUS
  Filled 2022-08-25: qty 1

## 2022-08-25 MED ORDER — IBUPROFEN 400 MG PO TABS
400.0000 mg | ORAL_TABLET | Freq: Once | ORAL | Status: AC
  Administered 2022-08-25: 400 mg via ORAL
  Filled 2022-08-25: qty 1

## 2022-08-25 MED ORDER — ASPIRIN 81 MG PO TBEC
81.0000 mg | DELAYED_RELEASE_TABLET | Freq: Every day | ORAL | Status: DC
  Administered 2022-08-26: 81 mg via ORAL
  Filled 2022-08-25: qty 1

## 2022-08-25 MED ORDER — CARVEDILOL 3.125 MG PO TABS
6.2500 mg | ORAL_TABLET | Freq: Two times a day (BID) | ORAL | Status: DC
  Administered 2022-08-25 – 2022-08-26 (×2): 6.25 mg via ORAL
  Filled 2022-08-25 (×2): qty 2

## 2022-08-25 MED ORDER — AZITHROMYCIN 250 MG PO TABS
500.0000 mg | ORAL_TABLET | Freq: Every day | ORAL | Status: AC
  Administered 2022-08-25: 500 mg via ORAL
  Filled 2022-08-25: qty 2

## 2022-08-25 MED ORDER — DM-GUAIFENESIN ER 30-600 MG PO TB12
1.0000 | ORAL_TABLET | Freq: Two times a day (BID) | ORAL | Status: DC
  Administered 2022-08-25 – 2022-08-26 (×2): 1 via ORAL
  Filled 2022-08-25 (×2): qty 1

## 2022-08-25 MED ORDER — ACETAMINOPHEN 650 MG RE SUPP: 650.0000 mg | Freq: Four times a day (QID) | RECTAL | Status: DC | PRN

## 2022-08-25 MED ORDER — SACUBITRIL-VALSARTAN 24-26 MG PO TABS
1.0000 | ORAL_TABLET | Freq: Two times a day (BID) | ORAL | Status: DC
  Administered 2022-08-25 – 2022-08-26 (×2): 1 via ORAL
  Filled 2022-08-25 (×2): qty 1

## 2022-08-25 MED ORDER — LACTATED RINGERS IV SOLN: INTRAVENOUS | Status: AC

## 2022-08-25 MED ORDER — ONDANSETRON HCL 4 MG/2ML IJ SOLN: 4.0000 mg | Freq: Four times a day (QID) | INTRAMUSCULAR | Status: DC | PRN

## 2022-08-25 MED ORDER — INSULIN ASPART 100 UNIT/ML IJ SOLN
0.0000 [IU] | Freq: Three times a day (TID) | INTRAMUSCULAR | Status: DC
  Administered 2022-08-26: 5 [IU] via SUBCUTANEOUS
  Administered 2022-08-26: 7 [IU] via SUBCUTANEOUS

## 2022-08-25 MED ORDER — MONTELUKAST SODIUM 10 MG PO TABS: 10.0000 mg | ORAL_TABLET | Freq: Every day | ORAL | Status: DC

## 2022-08-25 MED ORDER — NICOTINE 21 MG/24HR TD PT24
21.0000 mg | MEDICATED_PATCH | Freq: Every day | TRANSDERMAL | Status: DC
  Administered 2022-08-25 – 2022-08-26 (×2): 21 mg via TRANSDERMAL
  Filled 2022-08-25 (×2): qty 1

## 2022-08-25 MED ORDER — IPRATROPIUM-ALBUTEROL 0.5-2.5 (3) MG/3ML IN SOLN: 3.0000 mL | RESPIRATORY_TRACT | Status: DC | PRN

## 2022-08-25 MED ORDER — ONDANSETRON HCL 4 MG PO TABS: 4.0000 mg | ORAL_TABLET | Freq: Four times a day (QID) | ORAL | Status: DC | PRN

## 2022-08-25 MED ORDER — ATORVASTATIN CALCIUM 20 MG PO TABS
20.0000 mg | ORAL_TABLET | Freq: Every day | ORAL | Status: DC
  Administered 2022-08-26: 20 mg via ORAL
  Filled 2022-08-25: qty 1

## 2022-08-25 MED ORDER — ALBUTEROL SULFATE (2.5 MG/3ML) 0.083% IN NEBU
2.5000 mg | INHALATION_SOLUTION | Freq: Once | RESPIRATORY_TRACT | Status: AC
  Administered 2022-08-25: 2.5 mg via RESPIRATORY_TRACT
  Filled 2022-08-25: qty 3

## 2022-08-25 MED ORDER — AZITHROMYCIN 250 MG PO TABS
250.0000 mg | ORAL_TABLET | Freq: Every day | ORAL | Status: DC
  Administered 2022-08-26: 250 mg via ORAL
  Filled 2022-08-25: qty 1

## 2022-08-25 MED ORDER — MORPHINE SULFATE (PF) 2 MG/ML IV SOLN: 2.0000 mg | INTRAVENOUS | Status: DC | PRN

## 2022-08-25 MED ORDER — METHYLPREDNISOLONE SODIUM SUCC 125 MG IJ SOLR
80.0000 mg | INTRAMUSCULAR | Status: DC
  Administered 2022-08-26: 80 mg via INTRAVENOUS
  Filled 2022-08-25: qty 2

## 2022-08-25 MED ORDER — EMPAGLIFLOZIN 25 MG PO TABS
25.0000 mg | ORAL_TABLET | Freq: Every day | ORAL | Status: DC
  Administered 2022-08-26: 25 mg via ORAL
  Filled 2022-08-25 (×3): qty 1

## 2022-08-25 MED ORDER — METHYLPREDNISOLONE SODIUM SUCC 125 MG IJ SOLR
80.0000 mg | Freq: Once | INTRAMUSCULAR | Status: AC
  Administered 2022-08-25: 80 mg via INTRAVENOUS
  Filled 2022-08-25: qty 2

## 2022-08-25 MED ORDER — NICOTINE 21 MG/24HR TD PT24: 21.0000 mg | MEDICATED_PATCH | Freq: Every day | TRANSDERMAL | Status: DC

## 2022-08-25 MED ORDER — INSULIN GLARGINE-YFGN 100 UNIT/ML ~~LOC~~ SOLN
10.0000 [IU] | Freq: Every day | SUBCUTANEOUS | Status: DC
  Filled 2022-08-25: qty 0.1

## 2022-08-25 MED ORDER — ENOXAPARIN SODIUM 40 MG/0.4ML IJ SOSY
40.0000 mg | PREFILLED_SYRINGE | INTRAMUSCULAR | Status: DC
  Administered 2022-08-25: 40 mg via SUBCUTANEOUS
  Filled 2022-08-25: qty 0.4

## 2022-08-25 MED ORDER — ACETAMINOPHEN 325 MG PO TABS: 650.0000 mg | ORAL_TABLET | Freq: Four times a day (QID) | ORAL | Status: DC | PRN

## 2022-08-25 NOTE — ED Triage Notes (Signed)
Pt to ER from home with c/o upper abdominal pain that radiates into upper back.  Pt states nausea, denies vomiting.  Pt has hx of pancreatitis, states was seen by PCP and had labwork done and was told this is not pancreatitis.  Pt states was prescribed oxycodone and that is not controlling the pain.

## 2022-08-25 NOTE — ED Notes (Signed)
Tried to call report to the floor. Asked if we could hold the pt and report for 10 minutes. Will comply.

## 2022-08-25 NOTE — ED Notes (Signed)
Pt given a meal to eat by provider Burgess Amor, PA-C

## 2022-08-25 NOTE — ED Notes (Signed)
EDP in room and aware of pt's RA sats.
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
## 2022-08-25 NOTE — ED Provider Notes (Signed)
[place] EMERGENCY DEPARTMENT AT [facility] Provider Note   CSN: [id number] Arrival date & time: 08/25/22  1000     History  Chief Complaint  Patient presents with   Abdominal Pain    Emily [name] is a 62 y.o. female with a history including type 2 diabetes, hyperlipidemia, history of CHF, hypertension GERD, history of fatty liver disease, COPD and chronic pain from complex regional pain syndrome of her right lower extremity presenting for evaluation of upper abdominal pain that radiates into her back along with nausea without emesis, her symptoms have been present for approximately 1 week, she was seen by her primary provider and had blood work which included a normal lipase, hence she was sent here for further evaluation as it was not felt to be secondary to her chronic pancreatitis.  She denies fevers or chills, she has had poor p.o. intake this week secondary to her symptoms.  She has had no diarrhea, denies hematemesis.  She had a small bowel movement yesterday which did not provide relief.  She has had abdominal distention.  She takes oxycodone for her chronic pain which is not relieving her abdominal pain symptoms.  The history is provided by the patient.       Home Medications Prior to Admission medications   Medication Sig Start Date End Date Taking? Authorizing Provider  albuterol (VENTOLIN HFA) 108 (90 Base) MCG/ACT inhaler Inhale 2 puffs into the lungs every 6 (six) hours as needed for wheezing or shortness of breath. 08/11/20   Ardith Dark, MD  aspirin EC 81 MG tablet Take 1 tablet (81 mg total) by mouth daily. 03/27/13   Bensimhon, Bevelyn Buckles, MD  atorvastatin (LIPITOR) 20 MG tablet Take 1 tablet by mouth once daily 07/31/22   Bensimhon, Bevelyn Buckles, MD  Blood Glucose Monitoring Suppl (ONE TOUCH ULTRA MINI) w/Device KIT Use to test blood sugars daily. Dx: E11.9 12/02/20   Ardith Dark, MD  busPIRone (BUSPAR) 5 MG tablet Take 1 tablet (5 mg total) by mouth 2  (two) times daily. 08/22/22   Ardith Dark, MD  carvedilol (COREG) 6.25 MG tablet TAKE 1 TABLET BY MOUTH TWICE DAILY WITH A MEAL 06/07/21   Bensimhon, Bevelyn Buckles, MD  citalopram (CELEXA) 40 MG tablet Take 1 tablet (40 mg total) by mouth daily. 05/29/22   Ardith Dark, MD  dextromethorphan (DELSYM) 30 MG/5ML liquid Take 5 mLs (30 mg total) by mouth 2 (two) times daily as needed for cough. 06/13/21   Johnson, Clanford L, MD  empagliflozin (JARDIANCE) 25 MG TABS tablet Take 1 tablet (25 mg total) by mouth daily before breakfast. 02/28/22   Bensimhon, Bevelyn Buckles, MD  glucose blood test strip Use as instructed E11.49 09/26/21   Ardith Dark, MD  hydrOXYzine (ATARAX) 50 MG tablet Take 1 tablet (50 mg total) by mouth 3 (three) times daily as needed for itching. 02/28/22   Ardith Dark, MD  Insulin Degludec (TRESIBA [place]) Inject 20 Units into the skin daily. Patient not taking: Reported on 08/22/2022    [provider]  ipratropium-albuterol (DUONEB) 0.5-2.5 (3) MG/3ML SOLN Take 3 mLs by nebulization every 4 (four) hours as needed (wheezing, shortness of breath, cough). 06/13/21   Johnson, Clanford L, MD  lidocaine (LIDODERM) 5 % Place 1 patch onto the skin daily. Remove & Discard patch within 12 hours or as directed by MD 05/10/22   Adonis Huguenin, NP  LORazepam (ATIVAN) 2 MG tablet Take 1 tablet (  2 mg total) by mouth at bedtime as needed for anxiety. Do not take with pain medications. 05/30/22   Ardith Dark, MD  metFORMIN (GLUCOPHAGE-XR) 500 MG 24 hr tablet Take 2 tablets (1,000 mg total) by mouth 2 (two) times daily. 12/15/21   Ardith Dark, MD  montelukast (SINGULAIR) 10 MG tablet TAKE 1 TABLET BY MOUTH AT BEDTIME 01/11/22   Ardith Dark, MD  nystatin cream (MYCOSTATIN) Apply topically 2 (two) times daily. 11/24/21   [provider]  oxyCODONE (ROXICODONE) 5 MG immediate release tablet Take 1 tablet (5 mg total) by mouth every 6 (six) hours as needed for severe pain. 08/22/22   Ardith Dark, MD  oxyCODONE-acetaminophen (PERCOCET/ROXICET) 5-325 MG tablet Take 1 tablet by mouth every 6 (six) hours as needed for severe pain or moderate pain. Patient not taking: Reported on 08/22/2022 06/01/22   Nadara Mustard, MD  sacubitril-valsartan (ENTRESTO) 24-26 MG Take 1 tablet by mouth 2 (two) times daily. 03/02/22   Mil[place], Anderson Malta, FNP  triamcinolone cream (KENALOG) 0.1 % Apply topically 2 (two) times daily. 11/24/21   [provider]      Allergies    Contrast media [iodinated contrast media], Gadolinium, and Iodine-131    Review of Systems   Review of Systems  Constitutional:  Negative for chills and fever.  HENT:  Negative for congestion and sore throat.   Eyes: Negative.   Respiratory:  Negative for chest tightness and shortness of breath.   Cardiovascular:  Negative for chest pain.  Gastrointestinal:  Positive for abdominal pain and nausea. Negative for vomiting.  Genitourinary: Negative.  Negative for dysuria.  Musculoskeletal:  Negative for arthralgias, joint swelling and neck pain.  Skin: Negative.  Negative for rash and wound.  Neurological:  Negative for dizziness, weakness, light-headedness, numbness and headaches.  Psychiatric/Behavioral: Negative.    All other systems reviewed and are negative.   Physical Exam Updated Vital Signs BP 135/67   Pulse 95   Temp 98.8 F (37.1 C) (Oral)   Resp 18   Ht 5\' 5"  (1.651 m)   Wt 67.1 kg   SpO2 91%   BMI 24.63 kg/m  Physical Exam Vitals and nursing note reviewed.  Constitutional:      Appearance: She is well-developed.  HENT:     Head: Normocephalic and atraumatic.  Eyes:     Conjunctiva/sclera: Conjunctivae normal.  Cardiovascular:     Rate and Rhythm: Normal rate and regular rhythm.     Heart sounds: Normal heart sounds.  Pulmonary:     Effort: Pulmonary effort is normal.     Breath sounds: Normal breath sounds. No wheezing.  Abdominal:     General: Abdomen is protuberant. Bowel sounds are  normal.     Palpations: Abdomen is soft.     Tenderness: There is abdominal tenderness in the left upper quadrant. There is no guarding or rebound.     Comments: LUQ and left flank ttp. No distention, no palpable mass.  Musculoskeletal:        General: Normal range of motion.     Cervical back: Normal range of motion.  Skin:    General: Skin is warm and dry.  Neurological:     Mental Status: She is alert.     ED Results / Procedures / Treatments   Labs (all labs ordered are listed, but only abnormal results are displayed) Labs Reviewed  COMPREHENSIVE METABOLIC PANEL - Abnormal; Notable for the following components:  Result Value   Glucose, Bld 206 (*)    Creatinine, Ser 1.10 (*)    Calcium 8.8 (*)    AST 282 (*)    ALT 241 (*)    GFR, Estimated 56 (*)    All other components within normal limits  CBC - Abnormal; Notable for the following components:   RBC 5.12 (*)    All other components within normal limits  URINALYSIS, ROUTINE W REFLEX MICROSCOPIC - Abnormal; Notable for the following components:   Glucose, UA >=500 (*)    All other components within normal limits  CBG MONITORING, ED - Abnormal; Notable for the following components:   Glucose-Capillary 134 (*)    All other components within normal limits  LIPASE, BLOOD    EKG EKG Interpretation Date/Time:  Friday August 25 2022 17:38:46 EDT Ventricular Rate:  86 PR Interval:  155 QRS Duration:  87 QT Interval:  385 QTC Calculation: 461 R Axis:   36  Text Interpretation: Sinus rhythm Low voltage, precordial leads No significant change since prior 1/24 Confirmed by Meridee Score [phone]) on 08/25/2022 5:47:04 PM  Radiology DG Chest Portable 1 View  Result Date: 08/25/2022 CLINICAL DATA:  Shortness of breath.  Hypoxia EXAM: PORTABLE CHEST 1 VIEW COMPARISON:  X-ray 04/22/2022 FINDINGS: Chronic old right-sided rib deformities. No consolidation, pneumothorax or effusion. No edema. Normal cardiopericardial  silhouette. Calcified aorta. Overlapping cardiac leads. IMPRESSION: Chronic changes.  No acute cardiopulmonary disease. Electronically Signed   By: Karen Kays M.D.   On: 08/25/2022 18:05   CT ABDOMEN PELVIS WO CONTRAST  Result Date: 08/25/2022 CLINICAL DATA:  Upper abdominal pain that radiates to the upper back. Nausea. Previous history of pancreatitis EXAM: CT ABDOMEN AND PELVIS WITHOUT CONTRAST TECHNIQUE: Multidetector CT imaging of the abdomen and pelvis was performed following the standard protocol without IV contrast. RADIATION DOSE REDUCTION: This exam was performed according to the departmental dose-optimization program which includes automated exposure control, adjustment of the mA and/or kV according to patient size and/or use of iterative reconstruction technique. COMPARISON:  CT 12/29/2011.  Ultrasound earlier 08/25/2018 FINDINGS: Lower chest: There is some breathing motion lung bases. Basilar scar or atelectasis. No pleural effusion. Coronary artery calcifications are seen. Hepatobiliary: No focal liver abnormality is seen. Status post cholecystectomy. No biliary dilatation. Pancreas: Slight stranding in the area of the pancreatic head. Spleen: Normal in size without focal abnormality. Adrenals/Urinary Tract: Adrenal glands are preserved. No collecting system dilatation. The ureters have normal course and caliber extending down to the bladder. No renal or ureteral stones. There is some nonspecific left-sided perinephric stranding laterally with some herniation of perinephric fat lateral through the lateral wall defect in this location. Level of fat herniation is increased from the prior examination but the flank hernia was seen on the previous examination of 2013. Preserved contours of the urinary bladder. Stomach/Bowel: No oral contrast. The stomach and small bowel are nondilated. Large bowel is of normal course and caliber with scattered stool. There is some subtle wall thickening along the right  side of the colon but this loop is underdistended. Please correlate with any symptoms. Vascular/Lymphatic: Aortic atherosclerosis. No enlarged abdominal or pelvic lymph nodes. Reproductive: Status post hysterectomy. No adnexal masses. Other: No free air or free fluid. Musculoskeletal: Healing left lateral rib fractures are identified. Similar foci in the right side. Please correlate with the history IMPRESSION: Slight stranding in the area of the pancreatic head. Please correlate for any clinical evidence of pancreatitis. No obstruction, free air or free  fluid.  Normal appendix. Subacute to early chronic bilateral rib fractures. Please correlate with history. Persistent left flank abdominal wall hernia with herniation of mesenteric and perinephric fat. Electronically Signed   By: Karen Kays M.D.   On: 08/25/2022 14:33   US Abdomen Limited RUQ (LIVER/GB)  Result Date: 08/25/2022 CLINICAL DATA:  Elevated liver enzymes EXAM: ULTRASOUND ABDOMEN LIMITED RIGHT UPPER QUADRANT COMPARISON:  03/18/2012 abdomen ultrasound FINDINGS: Gallbladder: Status post cholecystectomy. No sonographic Murphy sign noted by sonographer. Common bile duct: Diameter: 5 mm, within normal limits. No intrahepatic biliary ductal dilatation. Liver: No focal lesion identified. Increased parenchymal echogenicity. Portal vein is patent on color Doppler imaging with normal direction of blood flow towards the liver. Other: None. IMPRESSION: Hepatic steatosis.  No acute finding in the right upper quadrant. Electronically Signed   By: Wiliam Ke M.D.   On: 08/25/2022 12:11    Procedures Procedures    Medications Ordered in ED Medications  fentaNYL (SUBLIMAZE) injection 50 mcg (50 mcg Intravenous Given 08/25/22 1234)  ondansetron (ZOFRAN) injection 4 mg (4 mg Intravenous Given 08/25/22 1234)  albuterol (PROVENTIL) (2.5 MG/3ML) 0.083% nebulizer solution 2.5 mg (2.5 mg Nebulization Given 08/25/22 1618)  oxyCODONE-acetaminophen  (PERCOCET/ROXICET) 5-325 MG per tablet 1 tablet (1 tablet Oral Given 08/25/22 1614)  ibuprofen (ADVIL) tablet 400 mg (400 mg Oral Given 08/25/22 1615)  methylPREDNISolone sodium succinate (SOLU-MEDROL) 125 mg/2 mL injection 80 mg (80 mg Intravenous Given 08/25/22 1747)  ipratropium-albuterol (DUONEB) 0.5-2.5 (3) MG/3ML nebulizer solution 3 mL (3 mLs Nebulization Given 08/25/22 1829)  oxyCODONE-acetaminophen (PERCOCET/ROXICET) 5-325 MG per tablet 1 tablet (1 tablet Oral Given 08/25/22 1930)    ED Course/ Medical Decision Making/ A&P                             Medical Decision Making Patient presented with upper abdominal pain with radiation to her back, consistent with prior episodes of pancreatitis.  Seen by PCP 3 days ago during which time her lipase was normal, was advised to present here for further evaluation of her symptoms.  She has had some nausea, denies vomiting, also denies chest pain but endorses having some anterior bilateral lower rib pain secondary to a fall which occurred several weeks ago resulting in rib fracture.  Differential diagnosis including smoldering pancreatitis, hepatitis, SBO, gastroenteritis, of note patient has had a cholecystectomy.  Labs today confirm the negative lipase, she does have a transaminitis with an AST of 282, ALT of 241.  She has a normal WBC count of 6.7.  During her ED stay, labs per above and CT imaging were completed with results below.  At the end of her visit it was noted that she was becoming increasingly hypoxic.  Initially was noted that she was 88% on room air.  She does have a history of COPD, she denies shortness of breath, also denies chest pain, she does states she has had increased wheezing, especially since her home nebulizer broke although she does continue to use her albuterol MDI.  She has had no chest pain, cough, fevers.  She was given an  albuterol neb after which her oxygen level decreased, ranging from 82 to 88% resting.  Additional labs  have been ordered, EKG and chest x-ray.  She continues to deny chest pain.   7:19 PM albuterol/atrovent neb completed, solumedrol given.  Pt's wheezing improved, still with distant breath sounds pulse 92% in 2L.  When O2 held,  drops to 84%  at rest.   Will need admission.  Amount and/or Complexity of Data Reviewed Labs: ordered.    Details: Labs are relatively reassuring, she has a urinalysis except for glucose greater than 500, her glucose and her c-Met is 206 today.  She has significantly elevated LFTs with should with an AST of 282 and an ALT of 241, normal lipase at 30, normal white count of 6.7 Radiology: ordered.    Details: Slight stranding around the pancreatic head, but with normal lipase, equivocal for acute acute on chronic pancreatitis.  She also has a persistent left flank abdominal wall hernia with mesenteric containing fat which could also explain her abdominal pain which appears to be a chronic finding.  No intestinal involvement at this site.  Chest x-ray negative for pneumonia. ECG/medicine tests: ordered and independent interpretation performed.    Details: Normal sinus rhythm with rate of 86. Discussion of management or test interpretation with external provider(s): Call placed to the hospitalist for admission.  Discussed with Dr. Thomes Dinning who accepts pt for admission.   Risk Decision regarding hospitalization.           Final Clinical Impression(s) / ED Diagnoses Final diagnoses:  Chronic obstructive pulmonary disease with acute exacerbation (HCC)  Transaminitis  Other specified abdominal hernia without obstruction or gangrene  Acute pancreatitis, unspecified complication status, unspecified pancreatitis type    Rx / DC Orders ED Discharge Orders     None         Victoriano Lain 08/25/22 Thayer Dallas, MD 08/28/22 1151

## 2022-08-26 DIAGNOSIS — J441 Chronic obstructive pulmonary disease with (acute) exacerbation: Secondary | ICD-10-CM

## 2022-08-26 LAB — COMPREHENSIVE METABOLIC PANEL
ALT: 149 U/L — ABNORMAL HIGH (ref 0–44)
AST: 97 U/L — ABNORMAL HIGH (ref 15–41)
Albumin: 3.3 g/dL — ABNORMAL LOW (ref 3.5–5.0)
Alkaline Phosphatase: 73 U/L (ref 38–126)
Anion gap: 11 (ref 5–15)
BUN: 20 mg/dL (ref 8–23)
CO2: 26 mmol/L (ref 22–32)
Calcium: 8.9 mg/dL (ref 8.9–10.3)
Chloride: 98 mmol/L (ref 98–111)
Creatinine, Ser: 1.18 mg/dL — ABNORMAL HIGH (ref 0.44–1.00)
GFR, Estimated: 52 mL/min — ABNORMAL LOW (ref >60)
Glucose, Bld: 320 mg/dL — ABNORMAL HIGH (ref 70–99)
Potassium: 4.2 mmol/L (ref 3.5–5.1)
Sodium: 135 mmol/L (ref 135–145)
Total Bilirubin: 0.5 mg/dL (ref 0.3–1.2)
Total Protein: 6.7 g/dL (ref 6.5–8.1)

## 2022-08-26 LAB — CBC
HCT: 41.3 % (ref 36.0–46.0)
Hemoglobin: 13.4 g/dL (ref 12.0–15.0)
MCH: 28.4 pg (ref 26.0–34.0)
MCHC: 32.4 g/dL (ref 30.0–36.0)
MCV: 87.5 fL (ref 80.0–100.0)
Platelets: 140 10*3/uL — ABNORMAL LOW (ref 150–400)
RBC: 4.72 MIL/uL (ref 3.87–5.11)
RDW: 12.9 % (ref 11.5–15.5)
WBC: 5.8 10*3/uL (ref 4.0–10.5)
nRBC: 0 % (ref 0.0–0.2)

## 2022-08-26 LAB — PHOSPHORUS: Phosphorus: 4.5 mg/dL (ref 2.5–4.6)

## 2022-08-26 LAB — MAGNESIUM: Magnesium: 1.9 mg/dL (ref 1.7–2.4)

## 2022-08-26 LAB — GLUCOSE, CAPILLARY
Glucose-Capillary: 270 mg/dL — ABNORMAL HIGH (ref 70–99)
Glucose-Capillary: 303 mg/dL — ABNORMAL HIGH (ref 70–99)

## 2022-08-26 LAB — HIV ANTIBODY (ROUTINE TESTING W REFLEX): HIV Screen 4th Generation wRfx: NONREACTIVE

## 2022-08-26 NOTE — Discharge Summary (Signed)
Physician Discharge Summary  Emily [name] WUJ:[id number] DOB: 1960/08/12 DOA: 08/25/2022  PCP: Ardith Dark, MD  Admit date: 08/25/2022  Discharge date: 08/26/2022  Admitted From:Home  Disposition:  Home  Recommendations for Outpatient Follow-up:  Follow up with PCP in 1-2 weeks Continue on home medications  Home Health:None  Equipment/Devices:None  Discharge Condition:Stable  CODE STATUS: Full  Diet recommendation: Heart Healthy/Carb Modified  Brief/Interim Summary:  Emily [name] is a 62 y.o. female with medical history significant of hypertension, hyperlipidemia, T2DM GERD, CHF, COPD, history of with liver disease and chronic pain due to complex regional pain syndrome of right lower extremity who presents to the emergency department due to about 1 week onset of abdominal pain.  She was admitted with acute on chronic pancreatitis and also had some mild hypoxemia on admission that was thought to be related to COPD exacerbation, but does not appear to be the case.  She was weaned off of her oxygen fairly readily and she is able to tolerate diet without any significant pain or nausea symptoms.  She is eager for discharge and appears stable and ready to do so at this time and can continue her usual home medications.  No other acute events or concerns noted during this brief hospitalization.  Discharge Diagnoses:  Principal Problem:   Acute exacerbation of chronic obstructive pulmonary disease (COPD) (HCC) Active Problems:   Chronic combined systolic and diastolic CHF (congestive heart failure) (HCC)   Essential hypertension   Acute respiratory failure with hypoxia (HCC)   Acute on chronic pancreatitis (HCC)   Mixed hyperlipidemia   Type 2 diabetes mellitus with hyperglycemia (HCC)   Transaminitis   Abdominal wall hernia   Tobacco abuse  Principal discharge diagnosis: Mild acute on chronic pancreatitis with mild hypoxemia-resolved  Discharge Instructions  Discharge  Instructions     Diet - low sodium heart healthy   Complete by: As directed    Increase activity slowly   Complete by: As directed       Allergies as of 08/26/2022       Reactions   Contrast Media [iodinated Contrast Media] Anaphylaxis   Gadolinium Shortness Of Breath, Other (See Comments)    Code: SOB, Onset Date: 95621308   Iodine-131    Other reaction(s): Angioedema (ALLERGY/intolerance)        Medication List     TAKE these medications    albuterol 108 (90 Base) MCG/ACT inhaler Commonly known as: VENTOLIN HFA Inhale 2 puffs into the lungs every 6 (six) hours as needed for wheezing or shortness of breath.   aspirin EC 81 MG tablet Take 1 tablet (81 mg total) by mouth daily.   atorvastatin 20 MG tablet Commonly known as: LIPITOR Take 1 tablet by mouth once daily   busPIRone 5 MG tablet Commonly known as: BUSPAR Take 1 tablet (5 mg total) by mouth 2 (two) times daily.   carvedilol 6.25 MG tablet Commonly known as: COREG TAKE 1 TABLET BY MOUTH TWICE DAILY WITH A MEAL   citalopram 40 MG tablet Commonly known as: CELEXA Take 1 tablet (40 mg total) by mouth daily.   dextromethorphan 30 MG/5ML liquid Commonly known as: Delsym Take 5 mLs (30 mg total) by mouth 2 (two) times daily as needed for cough.   empagliflozin 25 MG Tabs tablet Commonly known as: Jardiance Take 1 tablet (25 mg total) by mouth daily before breakfast.   Entresto 24-26 MG Generic drug: sacubitril-valsartan Take 1 tablet by mouth 2 (two) times daily.  glucose blood test strip Use as instructed E11.49   hydrOXYzine 50 MG tablet Commonly known as: ATARAX Take 1 tablet (50 mg total) by mouth 3 (three) times daily as needed for itching.   ipratropium-albuterol 0.5-2.5 (3) MG/3ML Soln Commonly known as: DUONEB Take 3 mLs by nebulization every 4 (four) hours as needed (wheezing, shortness of breath, cough).   lidocaine 5 % Commonly known as: Lidoderm Place 1 patch onto the skin daily.  Remove & Discard patch within 12 hours or as directed by MD   LORazepam 2 MG tablet Commonly known as: ATIVAN Take 1 tablet (2 mg total) by mouth at bedtime as needed for anxiety. Do not take with pain medications.   metFORMIN 500 MG 24 hr tablet Commonly known as: GLUCOPHAGE-XR Take 2 tablets (1,000 mg total) by mouth 2 (two) times daily.   montelukast 10 MG tablet Commonly known as: SINGULAIR TAKE 1 TABLET BY MOUTH AT BEDTIME   nystatin cream Commonly known as: MYCOSTATIN Apply topically 2 (two) times daily.   ONE TOUCH ULTRA MINI w/Device Kit Use to test blood sugars daily. Dx: E11.9   oxyCODONE 5 MG immediate release tablet Commonly known as: Roxicodone Take 1 tablet (5 mg total) by mouth every 6 (six) hours as needed for severe pain.   oxyCODONE-acetaminophen 5-325 MG tablet Commonly known as: PERCOCET/ROXICET Take 1 tablet by mouth every 6 (six) hours as needed for severe pain or moderate pain.   TRESIBA  Inject 20 Units into the skin daily.   triamcinolone cream 0.1 % Commonly known as: KENALOG Apply topically 2 (two) times daily.        Follow-up Information     Ardith Dark, MD. Schedule an appointment as soon as possible for a visit in 1 week(s).   Specialty: Family Medicine Contact information: [address] [place] [address] [phone]                Allergies  Allergen Reactions   Contrast Media [Iodinated Contrast Media] Anaphylaxis   Gadolinium Shortness Of Breath and Other (See Comments)     Code: SOB, Onset Date: 24401027    Iodine-131     Other reaction(s): Angioedema (ALLERGY/intolerance)    Consultations: None   Procedures/Studies: DG Chest Portable 1 View  Result Date: 08/25/2022 CLINICAL DATA:  Shortness of breath.  Hypoxia EXAM: PORTABLE CHEST 1 VIEW COMPARISON:  X-ray 04/22/2022 FINDINGS: Chronic old right-sided rib deformities. No consolidation, pneumothorax or effusion. No edema. Normal cardiopericardial  silhouette. Calcified aorta. Overlapping cardiac leads. IMPRESSION: Chronic changes.  No acute cardiopulmonary disease. Electronically Signed   By: Karen Kays M.D.   On: 08/25/2022 18:05   CT ABDOMEN PELVIS WO CONTRAST  Result Date: 08/25/2022 CLINICAL DATA:  Upper abdominal pain that radiates to the upper back. Nausea. Previous history of pancreatitis EXAM: CT ABDOMEN AND PELVIS WITHOUT CONTRAST TECHNIQUE: Multidetector CT imaging of the abdomen and pelvis was performed following the standard protocol without IV contrast. RADIATION DOSE REDUCTION: This exam was performed according to the departmental dose-optimization program which includes automated exposure control, adjustment of the mA and/or kV according to patient size and/or use of iterative reconstruction technique. COMPARISON:  CT 12/29/2011.  Ultrasound earlier 08/25/2018 FINDINGS: Lower chest: There is some breathing motion lung bases. Basilar scar or atelectasis. No pleural effusion. Coronary artery calcifications are seen. Hepatobiliary: No focal liver abnormality is seen. Status post cholecystectomy. No biliary dilatation. Pancreas: Slight stranding in the area of the pancreatic head. Spleen: Normal in size without focal  abnormality. Adrenals/Urinary Tract: Adrenal glands are preserved. No collecting system dilatation. The ureters have normal course and caliber extending down to the bladder. No renal or ureteral stones. There is some nonspecific left-sided perinephric stranding laterally with some herniation of perinephric fat lateral through the lateral wall defect in this location. Level of fat herniation is increased from the prior examination but the flank hernia was seen on the previous examination of 2013. Preserved contours of the urinary bladder. Stomach/Bowel: No oral contrast. The stomach and small bowel are nondilated. Large bowel is of normal course and caliber with scattered stool. There is some subtle wall thickening along the right  side of the colon but this loop is underdistended. Please correlate with any symptoms. Vascular/Lymphatic: Aortic atherosclerosis. No enlarged abdominal or pelvic lymph nodes. Reproductive: Status post hysterectomy. No adnexal masses. Other: No free air or free fluid. Musculoskeletal: Healing left lateral rib fractures are identified. Similar foci in the right side. Please correlate with the history IMPRESSION: Slight stranding in the area of the pancreatic head. Please correlate for any clinical evidence of pancreatitis. No obstruction, free air or free fluid.  Normal appendix. Subacute to early chronic bilateral rib fractures. Please correlate with history. Persistent left flank abdominal wall hernia with herniation of mesenteric and perinephric fat. Electronically Signed   By: Karen Kays M.D.   On: 08/25/2022 14:33   US Abdomen Limited RUQ (LIVER/GB)  Result Date: 08/25/2022 CLINICAL DATA:  Elevated liver enzymes EXAM: ULTRASOUND ABDOMEN LIMITED RIGHT UPPER QUADRANT COMPARISON:  03/18/2012 abdomen ultrasound FINDINGS: Gallbladder: Status post cholecystectomy. No sonographic Murphy sign noted by sonographer. Common bile duct: Diameter: 5 mm, within normal limits. No intrahepatic biliary ductal dilatation. Liver: No focal lesion identified. Increased parenchymal echogenicity. Portal vein is patent on color Doppler imaging with normal direction of blood flow towards the liver. Other: None. IMPRESSION: Hepatic steatosis.  No acute finding in the right upper quadrant. Electronically Signed   By: Wiliam Ke M.D.   On: 08/25/2022 12:11     Discharge Exam: Vitals:   08/26/22 0903 08/26/22 1030  BP: 105/67   Pulse: 78   Resp: 14   Temp: 98.1 F (36.7 C)   SpO2: 98% 94%   Vitals:   08/26/22 0105 08/26/22 0537 08/26/22 0903 08/26/22 1030  BP: 119/73 112/71 105/67   Pulse: 87 71 78   Resp: 18 17 14    Temp: 98.8 F (37.1 C) 97.8 F (36.6 C) 98.1 F (36.7 C)   TempSrc:      SpO2: 93% 94% 98% 94%   Weight:      Height:        General: Pt is alert, awake, not in acute distress Cardiovascular: RRR, S1/S2 +, no rubs, no gallops Respiratory: CTA bilaterally, no wheezing, no rhonchi Abdominal: Soft, NT, ND, bowel sounds + Extremities: no edema, no cyanosis    The results of significant diagnostics from this hospitalization (including imaging, microbiology, ancillary and laboratory) are listed below for reference.     Microbiology: Recent Results (from the past 240 hour(s))  Urine Culture     Status: None   Collection Time: 08/22/22  9:35 AM   Specimen: Urine  Result Value Ref Range Status   MICRO NUMBER: [id number]  Final   SPECIMEN QUALITY: Adequate  Final   Sample Source NOT GIVEN  Final   STATUS: FINAL  Final   Result:   Final    Less than 10,000 CFU/mL of single Gram negative organism isolated. No further testing will  be performed. If clinically indicated, recollection using a method to minimize contamination, with prompt transfer to Urine Culture Transport Tube, is recommended.     Labs: BNP (last 3 results) Recent Labs    03/02/22 1509  BNP 15.8   Basic Metabolic Panel: Recent Labs  Lab 08/22/22 0935 08/25/22 1108 08/26/22 0438  NA 137 135 135  K 4.5 4.0 4.2  CL 101 99 98  CO2 26 25 26   GLUCOSE 168* 206* 320*  BUN 19 12 20   CREATININE 1.20 1.10* 1.18*  CALCIUM 9.3 8.8* 8.9  MG  --   --  1.9  PHOS  --   --  4.5   Liver Function Tests: Recent Labs  Lab 08/22/22 0935 08/25/22 1108 08/26/22 0438  AST 15 282* 97*  ALT 10 241* 149*  ALKPHOS 62 86 73  BILITOT 0.6 0.7 0.5  PROT 6.8 7.3 6.7  ALBUMIN 4.1 3.8 3.3*   Recent Labs  Lab 08/22/22 0935 08/25/22 1108  LIPASE 31.0 30   No results for input(s): "AMMONIA" in the last 168 hours. CBC: Recent Labs  Lab 08/22/22 0935 08/25/22 1108 08/26/22 0438  WBC 8.5 6.7 5.8  HGB 14.9 14.6 13.4  HCT 45.9 44.7 41.3  MCV 86.5 87.3 87.5  PLT 179.0 176 140*   Cardiac Enzymes: No results for input(s):  "CKTOTAL", "CKMB", "CKMBINDEX", "TROPONINI" in the last 168 hours. BNP: Invalid input(s): "POCBNP" CBG: Recent Labs  Lab 08/25/22 1307 08/25/22 2150 08/26/22 0729 08/26/22 1138  GLUCAP 134* 323* 270* 303*   D-Dimer No results for input(s): "DDIMER" in the last 72 hours. Hgb A1c No results for input(s): "HGBA1C" in the last 72 hours. Lipid Profile No results for input(s): "CHOL", "HDL", "LDLCALC", "TRIG", "CHOLHDL", "LDLDIRECT" in the last 72 hours. Thyroid function studies No results for input(s): "TSH", "T4TOTAL", "T3FREE", "THYROIDAB" in the last 72 hours.  Invalid input(s): "FREET3" Anemia work up No results for input(s): "VITAMINB12", "FOLATE", "FERRITIN", "TIBC", "IRON", "RETICCTPCT" in the last 72 hours. Urinalysis    Component Value Date/Time   COLORURINE YELLOW 08/25/2022 1133   APPEARANCEUR CLEAR 08/25/2022 1133   LABSPEC 1.021 08/25/2022 1133   PHURINE 5.0 08/25/2022 1133   GLUCOSEU >=500 (A) 08/25/2022 1133   GLUCOSEU >=1000 (A) 08/22/2022 0935   HGBUR NEGATIVE 08/25/2022 1133   BILIRUBINUR NEGATIVE 08/25/2022 1133   BILIRUBINUR neg 06/09/2013 1337   BILIRUBINUR neg 11/08/2011 0000   KETONESUR NEGATIVE 08/25/2022 1133   PROTEINUR NEGATIVE 08/25/2022 1133   UROBILINOGEN 0.2 08/22/2022 0935   NITRITE NEGATIVE 08/25/2022 1133   LEUKOCYTESUR NEGATIVE 08/25/2022 1133   LEUKOCYTESUR Trace 11/08/2011 0000   Sepsis Labs Recent Labs  Lab 08/22/22 0935 08/25/22 1108 08/26/22 0438  WBC 8.5 6.7 5.8   Microbiology Recent Results (from the past 240 hour(s))  Urine Culture     Status: None   Collection Time: 08/22/22  9:35 AM   Specimen: Urine  Result Value Ref Range Status   MICRO NUMBER: [id number]  Final   SPECIMEN QUALITY: Adequate  Final   Sample Source NOT GIVEN  Final   STATUS: FINAL  Final   Result:   Final    Less than 10,000 CFU/mL of single Gram negative organism isolated. No further testing will be performed. If clinically indicated, recollection  using a method to minimize contamination, with prompt transfer to Urine Culture Transport Tube, is recommended.     Time coordinating discharge: 35 minutes  SIGNED:   Erick Blinks, DO [facility] 08/26/2022, 12:53 PM  If 7PM-7AM, please contact night-coverage [email or web address]

## 2022-08-26 NOTE — Care Management Obs Status (Signed)
MEDICARE OBSERVATION STATUS NOTIFICATION   Patient Details  Name: Emily [name] MRN: [id number] Date of Birth: 06/02/1960   Medicare Observation Status Notification Given:  Yes    Valentina Shaggy Kaisei Gilbo, LCSW 08/26/2022, 1:20 PM

## 2022-08-26 NOTE — Progress Notes (Signed)
Pt slept through the night. Vitals stable. SP02 92-94% on 2L [place].

## 2022-08-26 NOTE — Care Management CC44 (Signed)
Condition Code 44 Documentation Completed  Patient Details  Name: Emily [name] MRN: [id number] Date of Birth: 03-14-1960   Condition Code 44 given:  Yes Patient signature on Condition Code 44 notice:  Yes Documentation of 2 MD's agreement:  Yes Code 44 added to claim:  Yes    Larrie Kass, LCSW 08/26/2022, 1:19 PM

## 2022-08-28 ENCOUNTER — Other Ambulatory Visit: Payer: Self-pay | Admitting: Family Medicine

## 2022-08-28 DIAGNOSIS — Z1211 Encounter for screening for malignant neoplasm of colon: Secondary | ICD-10-CM | POA: Diagnosis not present

## 2022-08-29 MED ORDER — LORAZEPAM 2 MG PO TABS: 2.0000 mg | ORAL_TABLET | Freq: Every evening | ORAL | 0 refills | Status: DC | PRN

## 2022-09-05 ENCOUNTER — Other Ambulatory Visit (HOSPITAL_COMMUNITY): Payer: Self-pay | Admitting: Internal Medicine

## 2022-09-05 LAB — COLOGUARD: COLOGUARD: NEGATIVE

## 2022-09-06 ENCOUNTER — Other Ambulatory Visit: Payer: Self-pay | Admitting: Family Medicine

## 2022-09-06 DIAGNOSIS — Z1231 Encounter for screening mammogram for malignant neoplasm of breast: Secondary | ICD-10-CM

## 2022-09-06 NOTE — Progress Notes (Signed)
Good news! Cologuard is negative. We can recheck in 3 years.

## 2022-09-11 ENCOUNTER — Ambulatory Visit: Payer: Medicare Other | Admitting: Family Medicine

## 2022-09-11 ENCOUNTER — Encounter: Payer: Self-pay | Admitting: Family Medicine

## 2022-09-11 VITALS — BP 92/54 | HR 75 | Temp 97.8°F | Ht 65.0 in | Wt 143.8 lb

## 2022-09-11 DIAGNOSIS — G4700 Insomnia, unspecified: Secondary | ICD-10-CM

## 2022-09-11 DIAGNOSIS — K861 Other chronic pancreatitis: Secondary | ICD-10-CM

## 2022-09-11 DIAGNOSIS — Z7984 Long term (current) use of oral hypoglycemic drugs: Secondary | ICD-10-CM | POA: Diagnosis not present

## 2022-09-11 DIAGNOSIS — E1149 Type 2 diabetes mellitus with other diabetic neurological complication: Secondary | ICD-10-CM | POA: Diagnosis not present

## 2022-09-11 DIAGNOSIS — N644 Mastodynia: Secondary | ICD-10-CM | POA: Diagnosis not present

## 2022-09-11 DIAGNOSIS — Z853 Personal history of malignant neoplasm of breast: Secondary | ICD-10-CM | POA: Diagnosis not present

## 2022-09-11 DIAGNOSIS — I10 Essential (primary) hypertension: Secondary | ICD-10-CM

## 2022-09-11 LAB — POCT GLYCOSYLATED HEMOGLOBIN (HGB A1C): Hemoglobin A1C: 8.7 % — AB (ref 4.0–5.6)

## 2022-09-11 MED ORDER — LORAZEPAM 2 MG PO TABS: 2.0000 mg | ORAL_TABLET | Freq: Every evening | ORAL | 0 refills | Status: DC | PRN

## 2022-09-11 MED ORDER — LORAZEPAM 1 MG PO TABS: 2.0000 mg | ORAL_TABLET | Freq: Every evening | ORAL | 3 refills | Status: DC | PRN

## 2022-09-11 NOTE — Assessment & Plan Note (Signed)
A1c today 8.7.  She is working on lifestyle modifications.  On metformin 1000 mg twice daily and Jardiance 25 mg daily.  Avoiding GLP-1's due to history of recurrent pancreatitis.  Previously on tresiba however had to stop due to cost.  We did discuss restarting insulin however she would like to hold off on this for now.  She will continue to work on lifestyle modifications and we will recheck in 3 months.  If A1c is not at goal we will restart basal insulin at that time.  We did discuss referral to endocrinology however she declined.

## 2022-09-11 NOTE — Patient Instructions (Signed)
It was very nice to see you today!  Your A1c today is 8.7.  We need to work on getting this closer to 7.  Please continue to work on diet exercise and we can recheck in 3 months.  I will refill your Ativan today.  I will order a mammogram.  Return in about 3 months (around 12/12/2022).   Take care, Dr Jimmey Ralph  PLEASE NOTE:  If you had any lab tests, please let us know if you have not heard back within a few days. You may see your results on mychart before we have a chance to review them but we will give you a call once they are reviewed by [place].   If we ordered any referrals today, please let us know if you have not heard from their office within the next week.   If you had any urgent prescriptions sent in today, please check with the pharmacy within an hour of our visit to make sure the prescription was transmitted appropriately.   Please try these tips to maintain a healthy lifestyle:  Eat at least 3 REAL meals and 1-2 snacks per day.  Aim for no more than 5 hours between eating.  If you eat breakfast, please do so within one hour of getting up.   Each meal should contain half fruits/vegetables, one quarter protein, and one quarter carbs (no bigger than a computer mouse)  Cut down on sweet beverages. This includes juice, soda, and sweet tea.   Drink at least 1 glass of water with each meal and aim for at least 8 glasses per day  Exercise at least 150 minutes every week.

## 2022-09-11 NOTE — Assessment & Plan Note (Signed)
At goal on Entresto 49-51 twice daily and coreg 6.25 mg twice daily per cardiology.

## 2022-09-11 NOTE — Assessment & Plan Note (Signed)
Her recent flare has resolved.  She has done well the last few weeks.  She will continue to avoid triggering factors.  Consider gastroenterology referral if this continues to be problematic.

## 2022-09-11 NOTE — Addendum Note (Signed)
Addended by: Dyann Kief on: 09/11/2022 11:44 AM   Modules accepted: Orders

## 2022-09-11 NOTE — Assessment & Plan Note (Signed)
She has noticed open the breast pain recently on the right side.  We will refer for diagnostic mammogram.

## 2022-09-11 NOTE — Assessment & Plan Note (Signed)
On lorazepam 2 mg nightly.  Needs refill today however pharmacy is out of stock with a 2 mg tablet and she request that we send in as 1 mg tablet.  Database was reviewed without red flags.  She is aware to not take this with any of her pain medications.  Will refill today.

## 2022-09-11 NOTE — Progress Notes (Signed)
Emily [name] is a 62 y.o. female who presents today for an office visit.  Assessment/Plan:  Chronic Problems Addressed Today: Type 2 diabetes mellitus with neurological complications (HCC) A1c today 8.7.  She is working on lifestyle modifications.  On metformin 1000 mg twice daily and Jardiance 25 mg daily.  Avoiding GLP-1's due to history of recurrent pancreatitis.  Previously on tresiba however had to stop due to cost.  We did discuss restarting insulin however she would like to hold off on this for now.  She will continue to work on lifestyle modifications and we will recheck in 3 months.  If A1c is not at goal we will restart basal insulin at that time.  We did discuss referral to endocrinology however she declined.  Essential hypertension At goal on Entresto 49-51 twice daily and coreg 6.25 mg twice daily per cardiology.   Chronic recurrent pancreatitis (HCC) Her recent flare has resolved.  She has done well the last few weeks.  She will continue to avoid triggering factors.  Consider gastroenterology referral if this continues to be problematic.   History of breast cancer She has noticed open the breast pain recently on the right side.  We will refer for diagnostic mammogram.  Insomnia On lorazepam 2 mg nightly.  Needs refill today however pharmacy is out of stock with a 2 mg tablet and she request that we send in as 1 mg tablet.  Database was reviewed without red flags.  She is aware to not take this with any of her pain medications.  Will refill today.  Abdominal wall hernia Appointment with surgery pending for next week  Breast pain Will check diagnostic mammogram due to history of breast cancer.     Subjective:  HPI:  See Assessment / plan for status of chronic conditions.   Patient here today for hospitalization follow-up.  Went to the ED on 08/25/2022 with abdominal pain.  We had just previously seen her in the office for this as well and had try to manage her as  an outpatient for her chronic recurrent pancreatitis.  Pain did not improve and she went to the emergency room.  In the ED was noted to have mild hypoxemia when she was admitted for conservative management.  . She did get a CT scan while in the ED which did show some stranding to the area of the pancreatic head. She was also noticed to have an abdominal wall hernia with herniation of mesenteric and perinephric fat. Her pain improved while admitted and she was discharged home on hospital day 1. She was referred to surgery for her hernia. She has done well the last couple of weeks in terms of her abdominal pain.  No recurrences.  No nausea or vomiting.  Able to tolerate p.o. well.  Has appointment with surgery next week to discuss her hernia.  She also request referral for diagnostic mammogram.  She has noticed more breast pain on the right side.  She has a history of breast cancer, and would like to have a mammogram.  She did call to schedule mammogram however they told her that she needs an order for this.        Objective:  Physical Exam: BP (!) 92/54   Pulse 75   Temp 97.8 F (36.6 C) (Temporal)   Ht 5\' 5"  (1.651 m)   Wt 143 lb 12.8 oz (65.2 kg)   SpO2 96%   BMI 23.93 kg/m   Gen: No acute distress, resting  comfortably Neuro: Grossly normal, moves all extremities Psych: Normal affect and thought content  Time Spent: 45 minutes of total time was spent on the date of the encounter performing the following actions: chart review prior to seeing the patient including recent hospitalization, obtaining history, performing a medically necessary exam, counseling on the treatment plan, placing orders, and documenting in our EHR.        Katina Degree. Jimmey Ralph, MD 09/11/2022 11:40 AM

## 2022-09-13 ENCOUNTER — Other Ambulatory Visit: Payer: Self-pay | Admitting: Family Medicine

## 2022-09-13 DIAGNOSIS — N644 Mastodynia: Secondary | ICD-10-CM

## 2022-09-15 ENCOUNTER — Other Ambulatory Visit: Payer: Self-pay | Admitting: Family Medicine

## 2022-09-19 ENCOUNTER — Encounter: Payer: Self-pay | Admitting: General Surgery

## 2022-09-19 ENCOUNTER — Ambulatory Visit: Payer: Medicare Other | Admitting: General Surgery

## 2022-09-19 VITALS — BP 89/64 | HR 92 | Temp 98.0°F | Resp 14 | Ht 65.0 in | Wt 145.0 lb

## 2022-09-19 DIAGNOSIS — K458 Other specified abdominal hernia without obstruction or gangrene: Secondary | ICD-10-CM | POA: Diagnosis not present

## 2022-09-19 DIAGNOSIS — R220 Localized swelling, mass and lump, head: Secondary | ICD-10-CM | POA: Diagnosis not present

## 2022-09-19 NOTE — Patient Instructions (Signed)
Will excise the mass on the head 10/26/2022 Call if something changes. Hernia on the left flank has been there since PET 2009.  Will plan to monitor the hernia for now
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
## 2022-09-20 ENCOUNTER — Telehealth: Payer: Self-pay | Admitting: *Deleted

## 2022-09-20 NOTE — Telephone Encounter (Signed)
Referral for breast center faxed on 09/19/2022 Form placed to be scan in patient chart

## 2022-09-21 ENCOUNTER — Other Ambulatory Visit: Payer: Medicare Other

## 2022-09-24 ENCOUNTER — Other Ambulatory Visit: Payer: Self-pay | Admitting: Family Medicine

## 2022-09-25 ENCOUNTER — Ambulatory Visit: Payer: Medicare Other

## 2022-09-25 VITALS — Wt 145.0 lb

## 2022-09-25 DIAGNOSIS — Z122 Encounter for screening for malignant neoplasm of respiratory organs: Secondary | ICD-10-CM | POA: Diagnosis not present

## 2022-09-25 DIAGNOSIS — Z0000 Encounter for general adult medical examination without abnormal findings: Secondary | ICD-10-CM | POA: Diagnosis not present

## 2022-09-25 MED ORDER — CITALOPRAM HYDROBROMIDE 40 MG PO TABS: 40.0000 mg | ORAL_TABLET | Freq: Every day | ORAL | 0 refills | Status: DC
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
## 2022-09-25 NOTE — Patient Instructions (Signed)
Emily [name] , Thank you for taking time to come for your Medicare Wellness Visit. I appreciate your ongoing commitment to your health goals. Please review the following plan we discussed and let me know if I can assist you in the future.   Referrals/Orders/Follow-Ups/Clinician Recommendations: be more active without falls   This is a list of the screening recommended for you and due dates:  Health Maintenance  Topic Date Due   Yearly kidney health urinalysis for diabetes  10/19/2016   Complete foot exam   11/05/2021   Screening for Lung Cancer  12/07/2021   Mammogram  12/18/2021   Flu Shot  11/11/2022   Eye exam for diabetics  12/16/2022   Hemoglobin A1C  03/14/2023   Yearly kidney function blood test for diabetes  08/26/2023   Medicare Annual Wellness Visit  09/25/2023   DEXA scan (bone density measurement)  10/04/2024   Cologuard (Stool DNA test)  08/27/2025   DTaP/Tdap/Td vaccine (3 - Td or Tdap) 11/11/2031   COVID-19 Vaccine  Completed   Hepatitis C Screening  Completed   HIV Screening  Completed   Zoster (Shingles) Vaccine  Completed   HPV Vaccine  Aged Out   Colon Cancer Screening  Discontinued    Advanced directives: (In Chart) A copy of your advanced directives are scanned into your chart should your provider ever need it.  Next Medicare Annual Wellness Visit scheduled for next year: Yes  Preventive Care 40-64 Years, Female Preventive care refers to lifestyle choices and visits with your health care provider that can promote health and wellness. What does preventive care include? A yearly physical exam. This is also called an annual well check. Dental exams once or twice a year. Routine eye exams. Ask your health care provider how often you should have your eyes checked. Personal lifestyle choices, including: Daily care of your teeth and gums. Regular physical activity. Eating a healthy diet. Avoiding tobacco and drug use. Limiting alcohol use. Practicing safe  sex. Taking low-dose aspirin daily starting at age 34. Taking vitamin and mineral supplements as recommended by your health care provider. What happens during an annual well check? The services and screenings done by your health care provider during your annual well check will depend on your age, overall health, lifestyle risk factors, and family history of disease. Counseling  Your health care provider may ask you questions about your: Alcohol use. Tobacco use. Drug use. Emotional well-being. Home and relationship well-being. Sexual activity. Eating habits. Work and work Astronomer. Method of birth control. Menstrual cycle. Pregnancy history. Screening  You may have the following tests or measurements: Height, weight, and BMI. Blood pressure. Lipid and cholesterol levels. These may be checked every 5 years, or more frequently if you are over 61 years old. Skin check. Lung cancer screening. You may have this screening every year starting at age 66 if you have a 30-pack-year history of smoking and currently smoke or have quit within the past 15 years. Fecal occult blood test (FOBT) of the stool. You may have this test every year starting at age 83. Flexible sigmoidoscopy or colonoscopy. You may have a sigmoidoscopy every 5 years or a colonoscopy every 10 years starting at age 11. Hepatitis C blood test. Hepatitis B blood test. Sexually transmitted disease (STD) testing. Diabetes screening. This is done by checking your blood sugar (glucose) after you have not eaten for a while (fasting). You may have this done every 1-3 years. Mammogram. This may be done every 1-2 years.  Talk to your health care provider about when you should start having regular mammograms. This may depend on whether you have a family history of breast cancer. BRCA-related cancer screening. This may be done if you have a family history of breast, ovarian, tubal, or peritoneal cancers. Pelvic exam and Pap test. This  may be done every 3 years starting at age 52. Starting at age 26, this may be done every 5 years if you have a Pap test in combination with an HPV test. Bone density scan. This is done to screen for osteoporosis. You may have this scan if you are at high risk for osteoporosis. Discuss your test results, treatment options, and if necessary, the need for more tests with your health care provider. Vaccines  Your health care provider may recommend certain vaccines, such as: Influenza vaccine. This is recommended every year. Tetanus, diphtheria, and acellular pertussis (Tdap, Td) vaccine. You may need a Td booster every 10 years. Zoster vaccine. You may need this after age 65. Pneumococcal 13-valent conjugate (PCV13) vaccine. You may need this if you have certain conditions and were not previously vaccinated. Pneumococcal polysaccharide (PPSV23) vaccine. You may need one or two doses if you smoke cigarettes or if you have certain conditions. Talk to your health care provider about which screenings and vaccines you need and how often you need them. This information is not intended to replace advice given to you by your health care provider. Make sure you discuss any questions you have with your health care provider. Document Released: 03/12/2015 Document Revised: 11/03/2015 Document Reviewed: 12/15/2014 Elsevier Interactive Patient Education  2017 [employer].    Fall Prevention in the Home Falls can cause injuries. They can happen to people of all ages. There are many things you can do to make your home safe and to help prevent falls. What can I do on the outside of my home? Regularly fix the edges of walkways and driveways and fix any cracks. Remove anything that might make you trip as you walk through a door, such as a raised step or threshold. Trim any bushes or trees on the path to your home. Use bright outdoor lighting. Clear any walking paths of anything that might make someone trip, such  as rocks or tools. Regularly check to see if handrails are loose or broken. Make sure that both sides of any steps have handrails. Any raised decks and porches should have guardrails on the edges. Have any leaves, snow, or ice cleared regularly. Use sand or salt on walking paths during winter. Clean up any spills in your garage right away. This includes oil or grease spills. What can I do in the bathroom? Use night lights. Install grab bars by the toilet and in the tub and shower. Do not use towel bars as grab bars. Use non-skid mats or decals in the tub or shower. If you need to sit down in the shower, use a plastic, non-slip stool. Keep the floor dry. Clean up any water that spills on the floor as soon as it happens. Remove soap buildup in the tub or shower regularly. Attach bath mats securely with double-sided non-slip rug tape. Do not have throw rugs and other things on the floor that can make you trip. What can I do in the bedroom? Use night lights. Make sure that you have a light by your bed that is easy to reach. Do not use any sheets or blankets that are too big for your bed. They should  not hang down onto the floor. Have a firm chair that has side arms. You can use this for support while you get dressed. Do not have throw rugs and other things on the floor that can make you trip. What can I do in the kitchen? Clean up any spills right away. Avoid walking on wet floors. Keep items that you use a lot in easy-to-reach places. If you need to reach something above you, use a strong step stool that has a grab bar. Keep electrical cords out of the way. Do not use floor polish or wax that makes floors slippery. If you must use wax, use non-skid floor wax. Do not have throw rugs and other things on the floor that can make you trip. What can I do with my stairs? Do not leave any items on the stairs. Make sure that there are handrails on both sides of the stairs and use them. Fix  handrails that are broken or loose. Make sure that handrails are as long as the stairways. Check any carpeting to make sure that it is firmly attached to the stairs. Fix any carpet that is loose or worn. Avoid having throw rugs at the top or bottom of the stairs. If you do have throw rugs, attach them to the floor with carpet tape. Make sure that you have a light switch at the top of the stairs and the bottom of the stairs. If you do not have them, ask someone to add them for you. What else can I do to help prevent falls? Wear shoes that: Do not have high heels. Have rubber bottoms. Are comfortable and fit you well. Are closed at the toe. Do not wear sandals. If you use a stepladder: Make sure that it is fully opened. Do not climb a closed stepladder. Make sure that both sides of the stepladder are locked into place. Ask someone to hold it for you, if possible. Clearly mark and make sure that you can see: Any grab bars or handrails. First and last steps. Where the edge of each step is. Use tools that help you move around (mobility aids) if they are needed. These include: Canes. Walkers. Scooters. Crutches. Turn on the lights when you go into a dark area. Replace any light bulbs as soon as they burn out. Set up your furniture so you have a clear path. Avoid moving your furniture around. If any of your floors are uneven, fix them. If there are any pets around you, be aware of where they are. Review your medicines with your doctor. Some medicines can make you feel dizzy. This can increase your chance of falling. Ask your doctor what other things that you can do to help prevent falls. This information is not intended to replace advice given to you by your health care provider. Make sure you discuss any questions you have with your health care provider. Document Released: 12/10/2008 Document Revised: 07/22/2015 Document Reviewed: 03/20/2014 Elsevier Interactive Patient Education  2017  [employer].

## 2022-10-03 ENCOUNTER — Ambulatory Visit
Admission: RE | Admit: 2022-10-03 | Discharge: 2022-10-03 | Disposition: A | Discharge status: Home / Self Care | Payer: Medicare Other | Source: Ambulatory Visit | Attending: Family Medicine | Admitting: Family Medicine

## 2022-10-03 DIAGNOSIS — R928 Other abnormal and inconclusive findings on diagnostic imaging of breast: Secondary | ICD-10-CM | POA: Diagnosis not present

## 2022-10-03 DIAGNOSIS — N644 Mastodynia: Secondary | ICD-10-CM

## 2022-10-03 DIAGNOSIS — Z853 Personal history of malignant neoplasm of breast: Secondary | ICD-10-CM | POA: Diagnosis not present

## 2022-10-03 NOTE — Progress Notes (Signed)
Good news! Her mammogram did not show any significant abnormalities.  She should let us know if pain is not improving and we can refer her to GYN.

## 2022-10-26 ENCOUNTER — Ambulatory Visit (INDEPENDENT_AMBULATORY_CARE_PROVIDER_SITE_OTHER): Payer: Medicare Other | Admitting: General Surgery

## 2022-10-26 ENCOUNTER — Encounter: Payer: Self-pay | Admitting: General Surgery

## 2022-10-26 ENCOUNTER — Other Ambulatory Visit: Payer: Self-pay

## 2022-10-26 ENCOUNTER — Encounter: Payer: Self-pay | Admitting: *Deleted

## 2022-10-26 VITALS — BP 95/65 | HR 85 | Temp 97.8°F | Resp 22 | Ht 65.0 in | Wt 142.0 lb

## 2022-10-26 DIAGNOSIS — C4442 Squamous cell carcinoma of skin of scalp and neck: Secondary | ICD-10-CM

## 2022-10-26 DIAGNOSIS — C4492 Squamous cell carcinoma of skin, unspecified: Secondary | ICD-10-CM | POA: Diagnosis not present

## 2022-10-26 DIAGNOSIS — R220 Localized swelling, mass and lump, head: Secondary | ICD-10-CM

## 2022-10-26 MED ORDER — OXYCODONE HCL 5 MG PO TABS: 5.0000 mg | ORAL_TABLET | ORAL | 0 refills | Status: DC | PRN

## 2022-10-26 NOTE — Patient Instructions (Signed)
Bacitracin and petroleum gauze over area daily or as needed.  Call with issues.  Tylenol, ibuprofen and use the Roxicodone for breakthrough pain

## 2022-10-26 NOTE — Progress Notes (Signed)
[facility] Procedure Note  10/26/22  Pre-procedure Diagnosis:  Scalp mass   Post-procedure Diagnosis: Same   Procedure(s) Performed: Excisional biopsy of the scalp mass, 2cm    Surgeon: Leatrice Jewels. Henreitta Leber, MD   Assistants: No qualified resident was available    Anesthesia: Lidocaine 1%    Specimens:  Scalp mass    Estimated Blood Loss: Minimal  Wound Class: Dirty contaminated    Procedure Indications: Ms. Sehorn is a 62 yo who has a mass on her head that has been growing and we discussed could be a cyst versus something more concerning given her history of cancer.  We discussed risk of bleeding, infection, inability to close the wound, finding something more concerning like cancer and needing more surgery.   Findings: Large 2cm mass with crusting on the skin, thinning hair    Procedure: The patient was taken to the procedure room and placed in a sitting position. The scalp mass was prepared and draped in the usual sterile fashion. Lidocaine 1% was injected. I made an incision around the base realizing that I was not going to be able to close this wound. With sharp excision with scissors and scalp I was able to excise the mass for biopsy, and cauterized it for hemostasis.  Bacitracin was placed in the wound and petroleum gauze was placed onto this.   Final inspection revealed acceptable hemostasis. The patient tolerated the procedure well.   Bacitracin and petroleum gauze over area daily or as needed.  Call with issues.  Tylenol, ibuprofen and use the Roxicodone for breakthrough pain  Use pressure for any bleeding Ok to shower.  Future Appointments  Date Time Provider Department Center  11/08/2022  2:15 PM Lucretia Roers, MD [facility] None  12/18/2022  3:00 PM Bensimhon, Bevelyn Buckles, MD [facility] None     [name], MD [facility] [address] [place], [place] [address] [phone] (office)

## 2022-10-31 LAB — TISSUE SPECIMEN

## 2022-10-31 LAB — PATHOLOGY REPORT

## 2022-11-01 NOTE — Progress Notes (Signed)
I called patient and let her know that the excisional biopsy we did of the scalp lesion was a squamous cell cancer. She will need further excision and I see her next week 9/11. I am going to figure out the best next steps and if she will need ENT/Plastics this week and will have a plan for her next week. She is in agreement. Areas is doing well with the neosporin and petroleum gauze.

## 2022-11-02 NOTE — Progress Notes (Signed)
Thanks so much. I will get her referred to [facility].

## 2022-11-08 ENCOUNTER — Ambulatory Visit (INDEPENDENT_AMBULATORY_CARE_PROVIDER_SITE_OTHER): Payer: Medicare Other | Admitting: General Surgery

## 2022-11-08 ENCOUNTER — Encounter: Payer: Self-pay | Admitting: General Surgery

## 2022-11-08 VITALS — BP 101/72 | HR 92 | Temp 97.7°F | Resp 18 | Ht 65.0 in | Wt 139.0 lb

## 2022-11-08 DIAGNOSIS — C4442 Squamous cell carcinoma of skin of scalp and neck: Secondary | ICD-10-CM

## 2022-11-08 NOTE — Patient Instructions (Addendum)
Referral to [facility] for the squamous cell of the scalp. Referral to Oncology to get re-established with an Oncologist given all of your history.  Call with any questions or concerns.   Area healing, can do neosporin on the area as needed.

## 2022-11-08 NOTE — Progress Notes (Signed)
[facility]  Doing fair. Using some neosporin on the head.   BP 101/72   Pulse 92   Temp 97.7 F (36.5 C) (Oral)   Resp 18   Ht 5\' 5"  (1.651 m)   Wt 139 lb (63 kg)   SpO2 92%   BMI 23.13 kg/m  Scalp mass wound healing, dry and some scabbing, rolled edges noted.   Patient s/p excisional biopsy of the scalp mass. This has come back as squamous cell. She has had several different cancers in the past including prior squamous cell, prior leiomyosarcoma, and prior breast cancer.   [facility] no longer at [facility]. Referral to Dr. Ellin Saba Oncology for surveillance given all her cancers.  I have discussed her case with Dr. Ulice Bold Plastic surgery and given the location and need for further excision, [facility]. Ms. Shaban already has seen Dr. Jeannine Boga there for prior skin cancers. She has an appt in October.  Call with issues.    [name], MD [facility] [address] [place], [place] [address] [phone] (office)

## 2022-11-09 NOTE — Addendum Note (Signed)
Addended by: Phillips Odor on: 11/09/2022 08:39 AM   Modules accepted: Orders

## 2022-11-20 ENCOUNTER — Other Ambulatory Visit: Payer: Self-pay | Admitting: Family Medicine

## 2022-11-20 MED ORDER — OXYCODONE HCL 5 MG PO TABS: 5.0000 mg | ORAL_TABLET | Freq: Four times a day (QID) | ORAL | 0 refills | Status: DC | PRN

## 2022-11-26 DIAGNOSIS — C4442 Squamous cell carcinoma of skin of scalp and neck: Secondary | ICD-10-CM | POA: Insufficient documentation

## 2022-11-27 ENCOUNTER — Inpatient Hospital Stay: Payer: Medicare Other

## 2022-11-27 ENCOUNTER — Inpatient Hospital Stay: Payer: Medicare Other | Attending: Hematology | Admitting: Hematology

## 2022-11-27 VITALS — BP 88/48 | HR 84 | Temp 97.6°F | Resp 18 | Ht 65.0 in | Wt 141.6 lb

## 2022-11-27 DIAGNOSIS — Z803 Family history of malignant neoplasm of breast: Secondary | ICD-10-CM

## 2022-11-27 DIAGNOSIS — Z23 Encounter for immunization: Secondary | ICD-10-CM | POA: Diagnosis not present

## 2022-11-27 DIAGNOSIS — Z85828 Personal history of other malignant neoplasm of skin: Secondary | ICD-10-CM

## 2022-11-27 DIAGNOSIS — F17210 Nicotine dependence, cigarettes, uncomplicated: Secondary | ICD-10-CM

## 2022-11-27 DIAGNOSIS — C4442 Squamous cell carcinoma of skin of scalp and neck: Secondary | ICD-10-CM | POA: Diagnosis not present

## 2022-11-27 DIAGNOSIS — Z853 Personal history of malignant neoplasm of breast: Secondary | ICD-10-CM

## 2022-11-27 DIAGNOSIS — Z8041 Family history of malignant neoplasm of ovary: Secondary | ICD-10-CM

## 2022-11-27 MED ORDER — INFLUENZA VIRUS VACC SPLIT PF (FLUZONE) 0.5 ML IM SUSY
0.5000 mL | PREFILLED_SYRINGE | Freq: Once | INTRAMUSCULAR | Status: AC
  Administered 2022-11-27: 0.5 mL via INTRAMUSCULAR
  Filled 2022-11-27: qty 0.5

## 2022-11-27 NOTE — Patient Instructions (Addendum)
[facility]  Discharge Instructions  You were seen and examined today by Dr. Ellin Saba. Dr. Ellin Saba is a medical oncologist, meaning that he specializes in the treatment of cancer diagnoses. Dr. Ellin Saba discussed your past medical history, family history of cancers, and the events that led to you being here today.  You were referred to Dr. Ellin Saba due to a new diagnosis of squamous cell carcinoma of the scalp.  Dr. Ellin Saba has ordered a PET scan.  Follow-up as scheduled.  Thank you for choosing [facility] to provide your oncology and hematology care.   To afford each patient quality time with our provider, please arrive at least 15 minutes before your scheduled appointment time. You may need to reschedule your appointment if you arrive late (10 or more minutes). Arriving late affects you and other patients whose appointments are after yours.  Also, if you miss three or more appointments without notifying the office, you may be dismissed from the clinic at the provider's discretion.    Again, thank you for choosing [facility].  Our hope is that these requests will decrease the amount of time that you wait before being seen by our physicians.   If you have a lab appointment with the Cancer Center - please note that after April 8th, all labs will be drawn in the cancer center.  You do not have to check in or register with the main entrance as you have in the past but will complete your check-in at the cancer center.            _____________________________________________________________  Should you have questions after your visit to [facility], please contact our office at [phone] and follow the prompts.  Our office hours are 8:00 a.m. to 4:30 p.m. Monday - Thursday and 8:00 a.m. to 2:30 p.m. Friday.  Please note that voicemails left after 4:00 p.m. may not be returned until the following  business day.  We are closed weekends and all major holidays.  You do have access to a nurse 24-7, just call the main number to the clinic [phone] and do not press any options, hold on the line and a nurse will answer the phone.    For prescription refill requests, have your pharmacy contact our office and allow 72 hours.    Masks are no longer required in the cancer centers. If you would like for your care team to wear a mask while they are taking care of you, please let them know. You may have one support person who is at least 64 years old accompany you for your appointments.
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
## 2022-11-27 NOTE — Progress Notes (Signed)
Emily [name] presents today for injection per the provider's orders. Flu shot administration without incident; injection site WNL; see MAR for injection details.  Patient tolerated procedure well and without incident.  No questions or complaints noted at this time.

## 2022-12-02 ENCOUNTER — Other Ambulatory Visit: Payer: Self-pay | Admitting: Family Medicine

## 2022-12-02 ENCOUNTER — Other Ambulatory Visit (HOSPITAL_COMMUNITY): Payer: Self-pay | Admitting: Internal Medicine

## 2022-12-04 DIAGNOSIS — C4442 Squamous cell carcinoma of skin of scalp and neck: Secondary | ICD-10-CM | POA: Diagnosis not present

## 2022-12-07 ENCOUNTER — Encounter (HOSPITAL_COMMUNITY)
Admission: RE | Admit: 2022-12-07 | Discharge: 2022-12-07 | Disposition: A | Discharge status: Home / Self Care | Payer: Medicare Other | Source: Ambulatory Visit | Attending: Hematology | Admitting: Hematology

## 2022-12-07 DIAGNOSIS — C4442 Squamous cell carcinoma of skin of scalp and neck: Secondary | ICD-10-CM | POA: Diagnosis not present

## 2022-12-07 DIAGNOSIS — C4492 Squamous cell carcinoma of skin, unspecified: Secondary | ICD-10-CM | POA: Diagnosis not present

## 2022-12-07 MED ORDER — FLUDEOXYGLUCOSE F - 18 (FDG) INJECTION
7.8000 | Freq: Once | INTRAVENOUS | Status: AC | PRN
  Administered 2022-12-07: 7.8 via INTRAVENOUS

## 2022-12-08 ENCOUNTER — Encounter: Payer: Self-pay | Admitting: *Deleted

## 2022-12-08 NOTE — Progress Notes (Signed)
Please consider scheduling a lab appointment to obtain a urine albumin/creatinine.

## 2022-12-13 DIAGNOSIS — R350 Frequency of micturition: Secondary | ICD-10-CM | POA: Diagnosis not present

## 2022-12-13 DIAGNOSIS — R3912 Poor urinary stream: Secondary | ICD-10-CM | POA: Diagnosis not present
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
## 2022-12-14 ENCOUNTER — Inpatient Hospital Stay: Payer: Medicare Other | Attending: Hematology | Admitting: Hematology

## 2022-12-14 VITALS — BP 100/49 | HR 81 | Temp 97.5°F | Resp 18 | Ht 65.0 in | Wt 139.0 lb

## 2022-12-14 DIAGNOSIS — F17210 Nicotine dependence, cigarettes, uncomplicated: Secondary | ICD-10-CM | POA: Diagnosis not present

## 2022-12-14 DIAGNOSIS — C4442 Squamous cell carcinoma of skin of scalp and neck: Secondary | ICD-10-CM | POA: Insufficient documentation

## 2022-12-14 DIAGNOSIS — Z9221 Personal history of antineoplastic chemotherapy: Secondary | ICD-10-CM | POA: Diagnosis not present

## 2022-12-14 DIAGNOSIS — Z8041 Family history of malignant neoplasm of ovary: Secondary | ICD-10-CM | POA: Insufficient documentation

## 2022-12-14 DIAGNOSIS — Z8052 Family history of malignant neoplasm of bladder: Secondary | ICD-10-CM | POA: Insufficient documentation

## 2022-12-14 DIAGNOSIS — Z803 Family history of malignant neoplasm of breast: Secondary | ICD-10-CM | POA: Insufficient documentation

## 2022-12-14 DIAGNOSIS — Z853 Personal history of malignant neoplasm of breast: Secondary | ICD-10-CM | POA: Diagnosis not present

## 2022-12-14 MED ORDER — OXYCODONE HCL 5 MG PO TABS: 5.0000 mg | ORAL_TABLET | Freq: Four times a day (QID) | ORAL | 0 refills | Status: DC | PRN

## 2022-12-14 NOTE — Patient Instructions (Signed)
[facility]  Discharge Instructions  You were seen and examined today by Dr. Ellin Saba.  Dr. Ellin Saba discussed your most recent PET scan which revealed no unexpected findings. Proceed with your procedure as planned.  Follow-up as scheduled.  Thank you for choosing [facility] to provide your oncology and hematology care.   To afford each patient quality time with our provider, please arrive at least 15 minutes before your scheduled appointment time. You may need to reschedule your appointment if you arrive late (10 or more minutes). Arriving late affects you and other patients whose appointments are after yours.  Also, if you miss three or more appointments without notifying the office, you may be dismissed from the clinic at the provider's discretion.    Again, thank you for choosing [facility].  Our hope is that these requests will decrease the amount of time that you wait before being seen by our physicians.   If you have a lab appointment with the Cancer Center - please note that after April 8th, all labs will be drawn in the cancer center.  You do not have to check in or register with the main entrance as you have in the past but will complete your check-in at the cancer center.            _____________________________________________________________  Should you have questions after your visit to [facility], please contact our office at [phone] and follow the prompts.  Our office hours are 8:00 a.m. to 4:30 p.m. Monday - Thursday and 8:00 a.m. to 2:30 p.m. Friday.  Please note that voicemails left after 4:00 p.m. may not be returned until the following business day.  We are closed weekends and all major holidays.  You do have access to a nurse 24-7, just call the main number to the clinic [phone] and do not press any options, hold on the line and a nurse will answer the phone.    For  prescription refill requests, have your pharmacy contact our office and allow 72 hours.    Masks are no longer required in the cancer centers. If you would like for your care team to wear a mask while they are taking care of you, please let them know. You may have one support person who is at least 64 years old accompany you for your appointments.

## 2022-12-16 ENCOUNTER — Other Ambulatory Visit: Payer: Self-pay | Admitting: Family Medicine

## 2022-12-18 ENCOUNTER — Encounter (HOSPITAL_COMMUNITY): Payer: Medicare Other | Admitting: Internal Medicine

## 2022-12-18 MED ORDER — CITALOPRAM HYDROBROMIDE 40 MG PO TABS: 40.0000 mg | ORAL_TABLET | Freq: Every day | ORAL | 0 refills | Status: DC

## 2022-12-19 DIAGNOSIS — R350 Frequency of micturition: Secondary | ICD-10-CM | POA: Diagnosis not present

## 2022-12-19 DIAGNOSIS — R3912 Poor urinary stream: Secondary | ICD-10-CM | POA: Diagnosis not present

## 2022-12-20 ENCOUNTER — Encounter (HOSPITAL_COMMUNITY): Payer: Self-pay | Admitting: Internal Medicine

## 2022-12-20 ENCOUNTER — Ambulatory Visit (HOSPITAL_COMMUNITY)
Admission: RE | Admit: 2022-12-20 | Discharge: 2022-12-20 | Disposition: A | Discharge status: Home / Self Care | Payer: Medicare Other | Source: Ambulatory Visit | Attending: Internal Medicine | Admitting: Internal Medicine

## 2022-12-20 VITALS — BP 124/80 | HR 84 | Wt 144.6 lb

## 2022-12-20 DIAGNOSIS — F17210 Nicotine dependence, cigarettes, uncomplicated: Secondary | ICD-10-CM | POA: Insufficient documentation

## 2022-12-20 DIAGNOSIS — I6523 Occlusion and stenosis of bilateral carotid arteries: Secondary | ICD-10-CM | POA: Insufficient documentation

## 2022-12-20 DIAGNOSIS — Z853 Personal history of malignant neoplasm of breast: Secondary | ICD-10-CM | POA: Insufficient documentation

## 2022-12-20 DIAGNOSIS — F1491 Cocaine use, unspecified, in remission: Secondary | ICD-10-CM | POA: Diagnosis not present

## 2022-12-20 DIAGNOSIS — I428 Other cardiomyopathies: Secondary | ICD-10-CM | POA: Diagnosis not present

## 2022-12-20 DIAGNOSIS — I5022 Chronic systolic (congestive) heart failure: Secondary | ICD-10-CM | POA: Insufficient documentation

## 2022-12-20 DIAGNOSIS — E119 Type 2 diabetes mellitus without complications: Secondary | ICD-10-CM | POA: Diagnosis not present

## 2022-12-20 DIAGNOSIS — E1169 Type 2 diabetes mellitus with other specified complication: Secondary | ICD-10-CM

## 2022-12-20 DIAGNOSIS — Z8589 Personal history of malignant neoplasm of other organs and systems: Secondary | ICD-10-CM | POA: Diagnosis not present

## 2022-12-20 DIAGNOSIS — Z720 Tobacco use: Secondary | ICD-10-CM | POA: Diagnosis not present

## 2022-12-20 DIAGNOSIS — I2510 Atherosclerotic heart disease of native coronary artery without angina pectoris: Secondary | ICD-10-CM | POA: Insufficient documentation

## 2022-12-20 DIAGNOSIS — J449 Chronic obstructive pulmonary disease, unspecified: Secondary | ICD-10-CM | POA: Insufficient documentation

## 2022-12-20 DIAGNOSIS — Z794 Long term (current) use of insulin: Secondary | ICD-10-CM | POA: Insufficient documentation

## 2022-12-20 NOTE — Progress Notes (Addendum)
Patient ID: Emily [name], female   DOB: [birth year], 62 y.o.   MRN: [id number]   ADVANCED HF CLINIC NOTE  PCP: Ramiro Harvest, Katina Degree, MD HF Cardiologist: Dr. Gala Romney Oncologist: Dr Kendall Flack   Ms. States is a 62 yo woman with a history of ER + R breast cancer s/p R lumpectomy, DM2, cholelithiasis s/p cholecystectomy (02/2013), COPD, chronic recurrent pancreatitis, and systolic CHF due to nonischemic cardiomyopathy.   Patient was admitted in 8/14 with acute pulmonary edema and cardiogenic shock.  LHC showed moderate nonobstructive CAD (40-50% mLAD, 50-70% LCx, 50% PDA).  She was started on milrinone and diuresed.  Cause of cardiomyopathy suspected to be Adriamycin toxicity.    12/05/12 Carotid Dopplers- R 39 % stenosis and L  40-59% stenosis   Had surgery 4/17 in [place] to remove R chest leiomyosarcoma. Margins not clear so underwent repeat resection here with Dr. Donell Beers.    Admitted 5/17 with recurrent HF. Found to have EF 10-15% with NYHA IIIB-IV symptoms. UDS + cocaine. Cath showed non-obstructive CAD with CGS, initial PA sat 34%. Started on milrinone. Diuresed about 5 pounds. Milrinone weaned off and co-ox dropped down to 52% but was feeling ok.   Echo 12/22/19: EF 50-55%   Echo 7/22 EF 55-60%   Admitted 4/23 with COPD exacerbation. Treated with IV steroids, and abx.   Echo 03/02/22: EF 50-55%, RV normal.   Fell in February 24 which resulted in Colles' fracture of left radius.   Has squamous cell carcinoma of the scalp and is planning to proceed with Mohs surgery 10/29.  Here today for HF follow-up. Dyspneic with mild exertion which she attributes to her COPD. Uses a cane due to poor balance. No CP, edema, orthopnea or PND. Has lost about 20 pounds. Saw oncologist and PET scan ok.   Still smoking 1 ppd  Cardidac Studies:  Echo 8/14: EF 20-25% Echo 11/14: EF 30-35% Echo 4/15: EF 45-50% septum dysynnergic Echo 5/17: EF 15-20% Echo 10/17: EF 55-60% Echo 1/19: EF 40-45%. Echo 1/20:  EF 55-60%  Echo 1/24: EF 50-55%  - PFTs 1/19 FEV1 1.23 (45%) FVC 1.69 (48%) DLCO 61%  - Cath 5/17 Ao = 106/72 (87) LV =  108/28 (36) RA =  22 RV = 60/14/25 RA = 67/22 (51) PCW = 31 Fick cardiac output/index = 2.8/1.6 SVR = 1823 PVR = 7.0 WU FA sat = 90% PA sat = 34%, 42%  1. Mild non-obstructive CAD 2. Severe NICM with EF 25% by echo 3. Cardiogenic shock physiology with markedly elevated biventricular filling pressures Lexiscan 09/23/13: EF 50% normal perfusion Holter: SR occasional PVCs   Review of systems complete and found to be negative unless listed in HPI.    Current Outpatient Medications  Medication Sig Dispense Refill   albuterol (VENTOLIN HFA) 108 (90 Base) MCG/ACT inhaler Inhale 2 puffs into the lungs every 6 (six) hours as needed for wheezing or shortness of breath. 1 each 0   aspirin EC 81 MG tablet Take 1 tablet (81 mg total) by mouth daily. 90 tablet 3   atorvastatin (LIPITOR) 20 MG tablet Take 1 tablet by mouth once daily 90 tablet 3   Blood Glucose Monitoring Suppl (ONE TOUCH ULTRA MINI) w/Device KIT Use to test blood sugars daily. Dx: E11.9 1 kit 3   busPIRone (BUSPAR) 5 MG tablet Take 1 tablet (5 mg total) by mouth 2 (two) times daily. 60 tablet 3   carvedilol (COREG) 6.25 MG tablet TAKE 1 TABLET BY MOUTH TWICE DAILY  WITH A MEAL 180 tablet 0   citalopram (CELEXA) 40 MG tablet Take 1 tablet (40 mg total) by mouth daily. 30 tablet 0   empagliflozin (JARDIANCE) 25 MG TABS tablet Take 1 tablet (25 mg total) by mouth daily before breakfast. 100 tablet 3   glucose blood test strip Use as instructed E11.49 100 each 12   hydrOXYzine (ATARAX) 50 MG tablet Take 1 tablet (50 mg total) by mouth 3 (three) times daily as needed for itching. 90 tablet 3   ipratropium-albuterol (DUONEB) 0.5-2.5 (3) MG/3ML SOLN Take 3 mLs by nebulization every 4 (four) hours as needed (wheezing, shortness of breath, cough). 360 mL 1   LORazepam (ATIVAN) 1 MG tablet Take 2 tablets (2 mg  total) by mouth at bedtime as needed for anxiety. Do not take with pain medications. 180 tablet 3   metFORMIN (GLUCOPHAGE-XR) 500 MG 24 hr tablet Take 2 tablets by mouth twice daily 360 tablet 0   montelukast (SINGULAIR) 10 MG tablet TAKE 1 TABLET BY MOUTH AT BEDTIME 30 tablet 0   nystatin cream (MYCOSTATIN) Apply topically 2 (two) times daily.     oxyCODONE (ROXICODONE) 5 MG immediate release tablet Take 1 tablet (5 mg total) by mouth every 6 (six) hours as needed for severe pain (pain score 7-10). 30 tablet 0   sacubitril-valsartan (ENTRESTO) 24-26 MG Take 1 tablet by mouth 2 (two) times daily. 180 tablet 3   triamcinolone cream (KENALOG) 0.1 % Apply topically 2 (two) times daily.     No current facility-administered medications for this encounter.   BP 124/80   Pulse 84   Wt 65.6 kg (144 lb 9.6 oz)   SpO2 93%   BMI 24.06 kg/m   Wt Readings from Last 3 Encounters:  12/20/22 65.6 kg (144 lb 9.6 oz)  12/14/22 63 kg (139 lb)  11/27/22 64.2 kg (141 lb 9.6 oz)    Physical Exam General:  Weak appearing. No resp difficulty HEENT: normal large wound on scalp  Neck: supple. no JVD. Carotids 2+ bilat; no bruits. No lymphadenopathy or thryomegaly appreciated. Cor: PMI nondisplaced. Regular rate & rhythm. No rubs, gallops or murmurs. Lungs: markedly decreased throughout Abdomen: soft, nontender, nondistended. No hepatosplenomegaly. No bruits or masses. Good bowel sounds. Extremities: no cyanosis, clubbing, rash, edema Neuro: alert & orientedx3, cranial nerves grossly intact. moves all 4 extremities w/o difficulty. Affect pleasant   Assessment/Plan: 1. Chronic systolic HF  - NICM, likely Adriamycin toxicity - Echo 8/14 EF 20-25% - Echo 5/17 EF 15% - Echo 1/19 EF 40-45%. - Echo 1/20 EF 55-60% - Echo 10/21 EF 50-55%  - Echo 7/22 EF 55-60% - Echo: 03/02/22 EF 50-55%, RV normal - Stable NYHA II-III symptoms likely more due to COPD than HF - Volume status ok - Continue Entresto 24/26 mg  BID (cut back due to low BP) - Continue carvedilol 6.25 mg bid.  - Continue Jardiance 25 mg daily. No GU symptoms. - Off spiro with hyperK. Will not re-challenge at this point.    2. CAD - Moderate nonobstructive disease. - No s/s ischemia - Continue ASA and statin  3. COPD/tobacco use - Smoking 1 ppd.  - Encouraged cessation  4. Cocaine use - Remains abstinent  5. Breast Cancer - Previously received adriamycin.  - Cancer free for > 5 years. No change.  6. Carotid stenosis  - Asymptomatic  - Mild 1-39% stenosis 2/20  - On statin.   7. R chest leiomyosarcoma: - Had further resections and margins now clear.  -  Sees Dr. Ellin Saba - PET scan ok  8. DM2 - She is on insulin. - A1c 8.7 (7/24) - Continue Jardiance.    [name], MD  2:33 PM

## 2022-12-22 ENCOUNTER — Other Ambulatory Visit: Payer: Self-pay | Admitting: Family Medicine

## 2022-12-26 DIAGNOSIS — R229 Localized swelling, mass and lump, unspecified: Secondary | ICD-10-CM | POA: Diagnosis not present

## 2022-12-26 DIAGNOSIS — L814 Other melanin hyperpigmentation: Secondary | ICD-10-CM | POA: Diagnosis not present

## 2022-12-26 DIAGNOSIS — D229 Melanocytic nevi, unspecified: Secondary | ICD-10-CM | POA: Diagnosis not present

## 2022-12-26 DIAGNOSIS — C44529 Squamous cell carcinoma of skin of other part of trunk: Secondary | ICD-10-CM | POA: Diagnosis not present

## 2022-12-26 DIAGNOSIS — D485 Neoplasm of uncertain behavior of skin: Secondary | ICD-10-CM | POA: Diagnosis not present

## 2022-12-26 DIAGNOSIS — L820 Inflamed seborrheic keratosis: Secondary | ICD-10-CM | POA: Diagnosis not present

## 2022-12-26 DIAGNOSIS — L821 Other seborrheic keratosis: Secondary | ICD-10-CM | POA: Diagnosis not present

## 2022-12-26 DIAGNOSIS — C001 Malignant neoplasm of external lower lip: Secondary | ICD-10-CM | POA: Diagnosis not present

## 2022-12-26 DIAGNOSIS — Z85828 Personal history of other malignant neoplasm of skin: Secondary | ICD-10-CM | POA: Diagnosis not present

## 2022-12-26 DIAGNOSIS — C4442 Squamous cell carcinoma of skin of scalp and neck: Secondary | ICD-10-CM | POA: Diagnosis not present

## 2022-12-26 DIAGNOSIS — Z08 Encounter for follow-up examination after completed treatment for malignant neoplasm: Secondary | ICD-10-CM | POA: Diagnosis not present

## 2022-12-26 DIAGNOSIS — D0472 Carcinoma in situ of skin of left lower limb, including hip: Secondary | ICD-10-CM | POA: Diagnosis not present

## 2022-12-26 DIAGNOSIS — L570 Actinic keratosis: Secondary | ICD-10-CM | POA: Diagnosis not present

## 2022-12-26 DIAGNOSIS — L578 Other skin changes due to chronic exposure to nonionizing radiation: Secondary | ICD-10-CM | POA: Diagnosis not present

## 2022-12-31 ENCOUNTER — Other Ambulatory Visit: Payer: Self-pay | Admitting: Family Medicine

## 2023-01-01 ENCOUNTER — Other Ambulatory Visit: Payer: Self-pay | Admitting: Family Medicine

## 2023-01-04 ENCOUNTER — Other Ambulatory Visit: Payer: Self-pay

## 2023-01-08 ENCOUNTER — Telehealth: Payer: Self-pay | Admitting: Family Medicine

## 2023-01-08 MED ORDER — OXYCODONE HCL 5 MG PO TABS: 5.0000 mg | ORAL_TABLET | Freq: Four times a day (QID) | ORAL | 0 refills | Status: DC | PRN

## 2023-01-08 NOTE — Telephone Encounter (Signed)
Prescription Request  01/08/2023  LOV: 09/11/2022  What is the name of the medication or equipment? glucose blood test strip  oxyCODONE (ROXICODONE) 5 MG immediate release tablet    Have you contacted your pharmacy to request a refill? Yes   Which pharmacy would you like this sent to?  [facility] [address] [place] - [address] [place] [address] Phone: [phone] Fax: [phone]    Patient notified that their request is being sent to the clinical staff for review and that they should receive a response within 2 business days.   Please advise at Mobile [phone] (mobile)

## 2023-01-09 ENCOUNTER — Encounter: Payer: Self-pay | Admitting: Family Medicine

## 2023-01-09 DIAGNOSIS — S0100XA Unspecified open wound of scalp, initial encounter: Secondary | ICD-10-CM | POA: Diagnosis not present

## 2023-01-09 NOTE — Telephone Encounter (Signed)
Oncology refilled this yesterday.  Katina Degree. Jimmey Ralph, MD 01/09/2023 12:16 PM

## 2023-01-10 ENCOUNTER — Other Ambulatory Visit: Payer: Self-pay | Admitting: *Deleted

## 2023-01-10 DIAGNOSIS — R062 Wheezing: Secondary | ICD-10-CM

## 2023-01-10 MED ORDER — GLUCOSE BLOOD VI STRP: ORAL_STRIP | 12 refills | Status: DC

## 2023-01-10 MED ORDER — ALBUTEROL SULFATE HFA 108 (90 BASE) MCG/ACT IN AERS: 2.0000 | INHALATION_SPRAY | Freq: Four times a day (QID) | RESPIRATORY_TRACT | 0 refills | Status: DC | PRN

## 2023-01-10 NOTE — Telephone Encounter (Signed)
See attachment  Rx send to pharmacy

## 2023-01-14 ENCOUNTER — Other Ambulatory Visit: Payer: Self-pay | Admitting: Family Medicine

## 2023-01-18 DIAGNOSIS — L570 Actinic keratosis: Secondary | ICD-10-CM | POA: Diagnosis not present

## 2023-01-18 DIAGNOSIS — S0100XA Unspecified open wound of scalp, initial encounter: Secondary | ICD-10-CM | POA: Diagnosis not present

## 2023-01-18 DIAGNOSIS — C44529 Squamous cell carcinoma of skin of other part of trunk: Secondary | ICD-10-CM | POA: Diagnosis not present

## 2023-01-31 ENCOUNTER — Other Ambulatory Visit: Payer: Self-pay | Admitting: Family Medicine

## 2023-02-01 ENCOUNTER — Other Ambulatory Visit: Payer: Self-pay | Admitting: Family Medicine

## 2023-02-02 DIAGNOSIS — D485 Neoplasm of uncertain behavior of skin: Secondary | ICD-10-CM | POA: Diagnosis not present

## 2023-02-02 DIAGNOSIS — L570 Actinic keratosis: Secondary | ICD-10-CM | POA: Diagnosis not present

## 2023-02-02 DIAGNOSIS — D0472 Carcinoma in situ of skin of left lower limb, including hip: Secondary | ICD-10-CM | POA: Diagnosis not present

## 2023-02-02 DIAGNOSIS — L439 Lichen planus, unspecified: Secondary | ICD-10-CM | POA: Diagnosis not present

## 2023-02-12 ENCOUNTER — Other Ambulatory Visit (HOSPITAL_COMMUNITY): Payer: Self-pay | Admitting: Family Medicine

## 2023-02-12 ENCOUNTER — Other Ambulatory Visit: Payer: Self-pay | Admitting: Family Medicine

## 2023-03-01 ENCOUNTER — Other Ambulatory Visit: Payer: Self-pay | Admitting: Family Medicine

## 2023-03-01 DIAGNOSIS — L578 Other skin changes due to chronic exposure to nonionizing radiation: Secondary | ICD-10-CM | POA: Diagnosis not present

## 2023-03-01 DIAGNOSIS — L570 Actinic keratosis: Secondary | ICD-10-CM | POA: Diagnosis not present

## 2023-03-01 DIAGNOSIS — L820 Inflamed seborrheic keratosis: Secondary | ICD-10-CM | POA: Diagnosis not present

## 2023-03-01 NOTE — Telephone Encounter (Signed)
 Patient should have enough of this medication until she comes in on 03/05/23 for an OV.

## 2023-03-02 ENCOUNTER — Other Ambulatory Visit (HOSPITAL_COMMUNITY): Payer: Self-pay | Admitting: Internal Medicine

## 2023-03-02 ENCOUNTER — Other Ambulatory Visit: Payer: Self-pay | Admitting: Family Medicine

## 2023-03-05 ENCOUNTER — Ambulatory Visit: Payer: Medicare Other | Admitting: Family Medicine

## 2023-03-06 ENCOUNTER — Other Ambulatory Visit: Payer: Self-pay | Admitting: Family Medicine

## 2023-03-12 ENCOUNTER — Encounter: Payer: Self-pay | Admitting: Family Medicine

## 2023-03-12 ENCOUNTER — Ambulatory Visit (INDEPENDENT_AMBULATORY_CARE_PROVIDER_SITE_OTHER): Payer: Medicare Other | Admitting: Family Medicine

## 2023-03-12 VITALS — BP 89/62 | HR 82 | Temp 97.2°F | Ht 65.0 in | Wt 151.6 lb

## 2023-03-12 DIAGNOSIS — F321 Major depressive disorder, single episode, moderate: Secondary | ICD-10-CM

## 2023-03-12 DIAGNOSIS — S22050A Wedge compression fracture of T5-T6 vertebra, initial encounter for closed fracture: Secondary | ICD-10-CM

## 2023-03-12 DIAGNOSIS — E1149 Type 2 diabetes mellitus with other diabetic neurological complication: Secondary | ICD-10-CM | POA: Diagnosis not present

## 2023-03-12 DIAGNOSIS — Z7984 Long term (current) use of oral hypoglycemic drugs: Secondary | ICD-10-CM | POA: Diagnosis not present

## 2023-03-12 LAB — MICROALBUMIN / CREATININE URINE RATIO
Creatinine,U: 29.9 mg/dL
Microalb Creat Ratio: 2.3 mg/g (ref 0.0–30.0)
Microalb, Ur: <0.7 mg/dL (ref 0.0–1.9)

## 2023-03-12 LAB — POCT GLYCOSYLATED HEMOGLOBIN (HGB A1C): Hemoglobin A1C: 9.9 % — AB (ref 4.0–5.6)

## 2023-03-12 MED ORDER — OXYCODONE HCL 5 MG PO TABS: 5.0000 mg | ORAL_TABLET | Freq: Four times a day (QID) | ORAL | 0 refills | Status: DC | PRN

## 2023-03-12 MED ORDER — LORAZEPAM 1 MG PO TABS: 2.0000 mg | ORAL_TABLET | Freq: Every evening | ORAL | 3 refills | Status: DC | PRN

## 2023-03-12 MED ORDER — QUETIAPINE FUMARATE 50 MG PO TABS: 50.0000 mg | ORAL_TABLET | Freq: Every day | ORAL | 30 refills | Status: DC

## 2023-03-12 MED ORDER — TRESIBA FLEXTOUCH 100 UNIT/ML ~~LOC~~ SOPN: 10.0000 [IU] | PEN_INJECTOR | Freq: Every day | SUBCUTANEOUS | 5 refills | Status: DC

## 2023-03-12 MED ORDER — GLUCOSE BLOOD VI STRP: ORAL_STRIP | 12 refills | Status: DC

## 2023-03-12 NOTE — Patient Instructions (Signed)
 It was very nice to see you today!  Please restart your insulin .  We will send in tresiba .  Take 10 units daily.  Please check your sugar first thing in the morning and follow-up with us  in a couple of weeks.  We will try Seroquel  at night to help with your mood.  Let us  know how this is working for you in a few weeks.  I will refill your pain medications.  I will see back in 3 months.  Come back sooner if needed.  Return in about 3 months (around 06/10/2023).   Take care, Dr Kennyth  PLEASE NOTE:  If you had any lab tests, please let us  know if you have not heard back within a few days. You may see your results on mychart before we have a chance to review them but we will give you a call once they are reviewed by us .   If we ordered any referrals today, please let us  know if you have not heard from their office within the next week.   If you had any urgent prescriptions sent in today, please check with the pharmacy within an hour of our visit to make sure the prescription was transmitted appropriately.   Please try these tips to maintain a healthy lifestyle:  Eat at least 3 REAL meals and 1-2 snacks per day.  Aim for no more than 5 hours between eating.  If you eat breakfast, please do so within one hour of getting up.   Each meal should contain half fruits/vegetables, one quarter protein, and one quarter carbs (no bigger than a computer mouse)  Cut down on sweet beverages. This includes juice, soda, and sweet tea.   Drink at least 1 glass of water with each meal and aim for at least 8 glasses per day  Exercise at least 150 minutes every week.

## 2023-03-12 NOTE — Assessment & Plan Note (Signed)
 Uses oxycodone  as needed.  Uses this sporadically.  Last refill was a couple of months ago by her oncologist.  Did discuss that ideally this should be coming from a single provider however there are no discrepancies on her drug database.  Will refill oxycodone .  Medication does help with her pain and ability perform activities of daily living.  She has limited options for pain control otherwise due to her CKD and cardiac history.  Recheck in 3 months.

## 2023-03-12 NOTE — Assessment & Plan Note (Signed)
 Symptoms are not fully controlled.  She does admit to being under more stress recently due to is trying to settle her mother's estate.  She has had some conflict with her brother over this as well.  She does feel like Celexa  is effective.  Will continue this at 40 mg daily.  We have tried augmenting with different medications in the past including Wellbutrin  and Abilify  however these were not particularly effective.  We will start Seroquel .  She is aware of potential side effects.  She will follow-up with us  in a few weeks and we can adjust as needed.  We did discuss referral to psychiatry and psychology however she declined at this point due to cost and transportation concerns.  Did discuss with patient that there are not a whole lot of other options I can offer at this point and if she does not do with Seroquel  we will need to refer her to see a psychiatrist.  She is agreeable to this plan.  We discussed reasons to return to care or seek emergent care.

## 2023-03-12 NOTE — Assessment & Plan Note (Signed)
 A1c elevated 9.9.  She is working on lifestyle modifications though does admit to several dietary indiscretions.  We will continue metformin  1000 mg twice daily and Jardiance  25 mg daily.  She cannot take GLP-1's due to history recurrent pancreatitis.  We did discuss importance of good glycemic control to prevent complications from diabetes.  She is willing to restart insulin  at this point.  Will restart Tresiba  10 units daily.  Previously was on about 20 units before having to discontinue.  She will follow-up with us  in a few days via MyChart and we can adjust dose of her insulin  as needed.  Will refill her diabetic testing supplies.  Recheck A1c in 3 months.

## 2023-03-12 NOTE — Progress Notes (Signed)
 Emily [name] is a 63 y.o. female who presents today for an office visit.  Assessment/Plan:  Chronic Problems Addressed Today: Type 2 diabetes mellitus with neurological complications (HCC) A1c elevated 9.9.  She is working on lifestyle modifications though does admit to several dietary indiscretions.  We will continue metformin  1000 mg twice daily and Jardiance  25 mg daily.  She cannot take GLP-1's due to history recurrent pancreatitis.  We did discuss importance of good glycemic control to prevent complications from diabetes.  She is willing to restart insulin  at this point.  Will restart Tresiba  10 units daily.  Previously was on about 20 units before having to discontinue.  She will follow-up with us  in a few days via MyChart and we can adjust dose of her insulin  as needed.  Will refill her diabetic testing supplies.  Recheck A1c in 3 months.  Moderate single current episode of major depressive disorder (HCC) Symptoms are not fully controlled.  She does admit to being under more stress recently due to is trying to settle her mother's estate.  She has had some conflict with her brother over this as well.  She does feel like Celexa  is effective.  Will continue this at 40 mg daily.  We have tried augmenting with different medications in the past including Wellbutrin  and Abilify  however these were not particularly effective.  We will start Seroquel .  She is aware of potential side effects.  She will follow-up with us  in a few weeks and we can adjust as needed.  We did discuss referral to psychiatry and psychology however she declined at this point due to cost and transportation concerns.  Did discuss with patient that there are not a whole lot of other options I can offer at this point and if she does not do with Seroquel  we will need to refer her to see a psychiatrist.  She is agreeable to this plan.  We discussed reasons to return to care or seek emergent care.  Chronic pain secondary to Thoracic  compression fracture [facility]) Uses oxycodone  as needed.  Uses this sporadically.  Last refill was a couple of months ago by her oncologist.  Did discuss that ideally this should be coming from a single provider however there are no discrepancies on her drug database.  Will refill oxycodone .  Medication does help with her pain and ability perform activities of daily living.  She has limited options for pain control otherwise due to her CKD and cardiac history.  Recheck in 3 months.     Subjective:  HPI:  See Assessment / plan for status of chronic conditions.  Patient is here today for follow-up.  Last saw her about 6 months ago.  At that time A1c was 8.7.  We continued metformin  1000 g twice daily and Jardiance  25 mg daily.  We are avoiding GLP-1's due to history of recurrent pancreatitis.  She was seen on tresiba  however had to discontinue due to cost.  She does admit she has been under a lot more stress recently dealing with settling her mother does state.  Does not think that her depression is fully controlled.  She is not currently seeing a therapist.  She has been compliant with her current medications.  She does admit to several dietary indiscretions and does admit she is not eating like she should be.  She has been compliant with her diabetes medications as well.  She has been following with dermatology since her last visit and has had several  skin cancers removed from scalp and legs.        Objective:  Physical Exam: BP (!) 89/62   Pulse 82   Temp (!) 97.2 F (36.2 C) (Temporal)   Ht 5' 5 (1.651 m)   Wt 151 lb 9.6 oz (68.8 kg)   SpO2 96%   BMI 25.23 kg/m   Gen: No acute distress, resting comfortably Neuro: Grossly normal, moves all extremities Psych: Normal affect and thought content.  No SI or HI.      Worth HERO. Kennyth, MD 03/12/2023 11:27 AM

## 2023-03-13 ENCOUNTER — Other Ambulatory Visit: Payer: Self-pay | Admitting: Family Medicine

## 2023-03-13 NOTE — Progress Notes (Signed)
 Urine sample is normal

## 2023-03-20 ENCOUNTER — Other Ambulatory Visit: Payer: Self-pay | Admitting: Family Medicine

## 2023-03-20 MED ORDER — BUSPIRONE HCL 5 MG PO TABS: 5.0000 mg | ORAL_TABLET | Freq: Two times a day (BID) | ORAL | 2 refills | Status: DC

## 2023-03-25 ENCOUNTER — Encounter: Payer: Self-pay | Admitting: Family Medicine

## 2023-03-26 ENCOUNTER — Other Ambulatory Visit: Payer: Self-pay | Admitting: Family Medicine

## 2023-03-27 NOTE — Telephone Encounter (Signed)
I appreciate the update. Can we verify if the values are coming up or going down? Hard to tell based off the list she sent.  Katina Degree. Jimmey Ralph, MD 03/27/2023 12:54 PM

## 2023-03-28 ENCOUNTER — Other Ambulatory Visit: Payer: Self-pay | Admitting: Family Medicine

## 2023-03-28 NOTE — Telephone Encounter (Signed)
Copied from CRM [id number]. Topic: Clinical - Medication Refill >> Mar 28, 2023  5:21 PM Eunice Blase wrote: Most Recent Primary Care Visit:  Provider: Ardith Dark  Department: [facility]  Visit Type: OFFICE VISIT  Date: 03/12/2023  Medication: LORazepam (ATIVAN) 1 MG tablet  Has the patient contacted their pharmacy? Yes (Agent: If no, request that the patient contact the pharmacy for the refill. If patient does not wish to contact the pharmacy document the reason why and proceed with request.) (Agent: If yes, when and what did the pharmacy advise?)Pharmacy have not received fax.   Is this the correct pharmacy for this prescription? Yes If no, delete pharmacy and type the correct one.  This is the patient's preferred pharmacy:  [facility] [address], [place] - [address] [place] [address] [place] [address] Phone: [phone] Fax: [phone]     Has the prescription been filled recently? Yes  Is the patient out of the medication? Yes  Has the patient been seen for an appointment in the last year OR does the patient have an upcoming appointment? Yes  Can we respond through MyChart? Yes  Agent: Please be advised that Rx refills may take up to 3 business days. We ask that you follow-up with your pharmacy.

## 2023-03-29 ENCOUNTER — Other Ambulatory Visit: Payer: Self-pay | Admitting: Family Medicine

## 2023-03-29 NOTE — Telephone Encounter (Signed)
Copied from CRM [id number]. Topic: Clinical - Prescription Issue >> Mar 29, 2023  5:35 PM Denese Killings wrote: Reason for CRM: Patient stated that [facility] advised medication Lorazepam is not authorized. They still haven't received an order.

## 2023-03-30 MED ORDER — LORAZEPAM 1 MG PO TABS: 2.0000 mg | ORAL_TABLET | Freq: Every evening | ORAL | 3 refills | Status: DC | PRN

## 2023-03-30 NOTE — Telephone Encounter (Signed)
Last OV: 03/12/23  Next OV: None  Last Filled: 03/12/23  Quantity: 180 w/ 3 refills

## 2023-03-30 NOTE — Telephone Encounter (Signed)
Noted. She can increase [place] to 12 units daily and follow up with [place] in a week or two.  Katina Degree. Jimmey Ralph, MD 03/30/2023 3:10 PM

## 2023-04-02 ENCOUNTER — Other Ambulatory Visit (HOSPITAL_COMMUNITY): Payer: Self-pay | Admitting: Internal Medicine

## 2023-04-08 ENCOUNTER — Other Ambulatory Visit: Payer: Self-pay | Admitting: Family Medicine

## 2023-04-16 ENCOUNTER — Other Ambulatory Visit: Payer: Self-pay | Admitting: Family Medicine

## 2023-04-16 NOTE — Telephone Encounter (Signed)
03/12/2023 LOV  03/12/2023 fill date  30/0 refills

## 2023-04-17 MED ORDER — OXYCODONE HCL 5 MG PO TABS: 5.0000 mg | ORAL_TABLET | Freq: Four times a day (QID) | ORAL | 0 refills | Status: DC | PRN

## 2023-05-08 ENCOUNTER — Other Ambulatory Visit: Payer: Self-pay | Admitting: Family Medicine

## 2023-05-14 ENCOUNTER — Encounter (HOSPITAL_COMMUNITY): Payer: Self-pay

## 2023-05-14 ENCOUNTER — Emergency Department (HOSPITAL_COMMUNITY)
Admission: EM | Admit: 2023-05-14 | Discharge: 2023-05-14 | Disposition: A | Discharge status: Home / Self Care | Attending: Emergency Medicine | Admitting: Emergency Medicine

## 2023-05-14 ENCOUNTER — Emergency Department (HOSPITAL_COMMUNITY)

## 2023-05-14 ENCOUNTER — Other Ambulatory Visit: Payer: Self-pay

## 2023-05-14 DIAGNOSIS — S22069A Unspecified fracture of T7-T8 vertebra, initial encounter for closed fracture: Secondary | ICD-10-CM

## 2023-05-14 DIAGNOSIS — M40204 Unspecified kyphosis, thoracic region: Secondary | ICD-10-CM | POA: Diagnosis not present

## 2023-05-14 DIAGNOSIS — S22050A Wedge compression fracture of T5-T6 vertebra, initial encounter for closed fracture: Secondary | ICD-10-CM | POA: Insufficient documentation

## 2023-05-14 DIAGNOSIS — M546 Pain in thoracic spine: Secondary | ICD-10-CM | POA: Diagnosis not present

## 2023-05-14 DIAGNOSIS — S2232XA Fracture of one rib, left side, initial encounter for closed fracture: Secondary | ICD-10-CM | POA: Diagnosis not present

## 2023-05-14 DIAGNOSIS — S22060A Wedge compression fracture of T7-T8 vertebra, initial encounter for closed fracture: Secondary | ICD-10-CM | POA: Insufficient documentation

## 2023-05-14 DIAGNOSIS — E119 Type 2 diabetes mellitus without complications: Secondary | ICD-10-CM | POA: Insufficient documentation

## 2023-05-14 DIAGNOSIS — Z7984 Long term (current) use of oral hypoglycemic drugs: Secondary | ICD-10-CM | POA: Insufficient documentation

## 2023-05-14 DIAGNOSIS — R918 Other nonspecific abnormal finding of lung field: Secondary | ICD-10-CM | POA: Diagnosis not present

## 2023-05-14 DIAGNOSIS — J449 Chronic obstructive pulmonary disease, unspecified: Secondary | ICD-10-CM | POA: Diagnosis not present

## 2023-05-14 DIAGNOSIS — Z794 Long term (current) use of insulin: Secondary | ICD-10-CM | POA: Insufficient documentation

## 2023-05-14 DIAGNOSIS — M47814 Spondylosis without myelopathy or radiculopathy, thoracic region: Secondary | ICD-10-CM | POA: Diagnosis not present

## 2023-05-14 DIAGNOSIS — I509 Heart failure, unspecified: Secondary | ICD-10-CM | POA: Insufficient documentation

## 2023-05-14 DIAGNOSIS — M549 Dorsalgia, unspecified: Secondary | ICD-10-CM | POA: Diagnosis present

## 2023-05-14 DIAGNOSIS — W19XXXA Unspecified fall, initial encounter: Secondary | ICD-10-CM | POA: Diagnosis not present

## 2023-05-14 DIAGNOSIS — I700 Atherosclerosis of aorta: Secondary | ICD-10-CM | POA: Diagnosis not present

## 2023-05-14 DIAGNOSIS — M4854XA Collapsed vertebra, not elsewhere classified, thoracic region, initial encounter for fracture: Secondary | ICD-10-CM | POA: Diagnosis not present

## 2023-05-14 DIAGNOSIS — Z7982 Long term (current) use of aspirin: Secondary | ICD-10-CM | POA: Diagnosis not present

## 2023-05-14 DIAGNOSIS — R0781 Pleurodynia: Secondary | ICD-10-CM | POA: Diagnosis not present

## 2023-05-14 MED ORDER — KETOROLAC TROMETHAMINE 15 MG/ML IJ SOLN
15.0000 mg | Freq: Once | INTRAMUSCULAR | Status: AC
  Administered 2023-05-14: 15 mg via INTRAMUSCULAR
  Filled 2023-05-14: qty 1

## 2023-05-14 MED ORDER — OXYCODONE HCL 5 MG PO TABS
5.0000 mg | ORAL_TABLET | ORAL | Status: AC
  Administered 2023-05-14: 5 mg via ORAL
  Filled 2023-05-14: qty 1

## 2023-05-14 MED ORDER — LIDOCAINE 5 % EX PTCH
1.0000 | MEDICATED_PATCH | CUTANEOUS | Status: DC
  Administered 2023-05-14: 1 via TRANSDERMAL
  Filled 2023-05-14: qty 1

## 2023-05-14 MED ORDER — OXYCODONE HCL 5 MG PO TABS: 5.0000 mg | ORAL_TABLET | Freq: Four times a day (QID) | ORAL | 0 refills | Status: DC | PRN

## 2023-05-14 NOTE — Discharge Instructions (Addendum)
 You were seen for your back pain in the emergency department.  You are found to have a T7 compression fracture.  At home, please use over-the-counter Tylenol, ibuprofen, and lidocaine patches.  You may also use the oxycodone we have prescribed you as needed for pain.  Do not take the oxycodone before driving or operating heavy machinery.  Follow-up with your primary doctor in 2-3 days regarding your visit.  Follow-up with the spine clinic as soon as possible regarding your symptoms.  Return immediately to the emergency department if you experience any of the following: Numbness or weakness of your legs, bowel or bladder incontinence, numbness while wiping after pooping or urinating, or any other concerning symptoms.    Thank you for visiting our Emergency Department. It was a pleasure taking care of you today.

## 2023-05-14 NOTE — ED Triage Notes (Signed)
 Pt states falling and landing on her back. Pt has pain to left shoulder blade and hx of breaking bones easily. Pt states this happened 2-3 weeks ago and has gotten worse, states it's hard to breathe.

## 2023-05-14 NOTE — ED Provider Notes (Signed)
 [place] EMERGENCY DEPARTMENT AT [facility] Provider Note   CSN: [id number] Arrival date & time: 05/14/23  7829     History  Chief Complaint  Patient presents with   Back Pain    Emily [name] is a 63 y.o. female.  63 year old female with a history of diabetes, CHF, COPD, and chronic pain from a thoracic compression fracture who presents emergency department back pain.  Patient reports that 2 to 3 weeks ago she had a mechanical fall.  Says that she landed flat on her back.  Has been having back pain since then.  Says that the back pain is making it difficult to breathe.  No fevers.  Has a chronic cough but no changes recently.  No chest pain.  Wanted to be evaluated today because she has a history of breaking bones easily.  No other injuries as result of the fall and no head strike or LOC.  Denies any paresthesias of her arms or legs.  No numbness or weakness of her arms or legs.  No bowel or bladder incontinence.       Home Medications Prior to Admission medications   Medication Sig Start Date End Date Taking? Authorizing Provider  albuterol (VENTOLIN HFA) 108 (90 Base) MCG/ACT inhaler Inhale 2 puffs into the lungs every 6 (six) hours as needed for wheezing or shortness of breath. 01/10/23   Ardith Dark, MD  aspirin EC 81 MG tablet Take 1 tablet (81 mg total) by mouth daily. 03/27/13   Bensimhon, Bevelyn Buckles, MD  atorvastatin (LIPITOR) 20 MG tablet Take 1 tablet by mouth once daily 07/31/22   Bensimhon, Bevelyn Buckles, MD  Blood Glucose Monitoring Suppl (ONE TOUCH ULTRA MINI) w/Device KIT Use to test blood sugars daily. Dx: E11.9 12/02/20   Ardith Dark, MD  busPIRone (BUSPAR) 5 MG tablet Take 1 tablet (5 mg total) by mouth 2 (two) times daily. 03/20/23   Ardith Dark, MD  carvedilol (COREG) 6.25 MG tablet TAKE 1 TABLET BY MOUTH TWICE DAILY WITH A MEAL 03/02/23   Bensimhon, Bevelyn Buckles, MD  citalopram (CELEXA) 40 MG tablet Take 1 tablet by mouth once daily 05/08/23   Ardith Dark, MD  glucose blood test strip Use as instructed E11.49 03/12/23   Ardith Dark, MD  hydrOXYzine (ATARAX) 50 MG tablet TAKE 1 TABLET BY MOUTH THREE TIMES DAILY AS NEEDED FOR ITCHING 04/09/23   Ardith Dark, MD  insulin degludec (TRESIBA FLEXTOUCH) 100 UNIT/ML FlexTouch Pen Inject 10 Units into the skin daily. 03/12/23   Ardith Dark, MD  ipratropium-albuterol (DUONEB) 0.5-2.5 (3) MG/3ML SOLN Take 3 mLs by nebulization every 4 (four) hours as needed (wheezing, shortness of breath, cough). 06/13/21   Johnson, Clanford L, MD  JARDIANCE 25 MG TABS tablet TAKE 1 TABLET BY MOUTH ONCE DAILY BEFORE BREAKFAST 04/03/23   Bensimhon, Bevelyn Buckles, MD  LORazepam (ATIVAN) 1 MG tablet Take 2 tablets (2 mg total) by mouth at bedtime as needed for anxiety. Do not take with pain medications. 03/30/23   Ardith Dark, MD  metFORMIN (GLUCOPHAGE-XR) 500 MG 24 hr tablet Take 2 tablets by mouth twice daily 03/02/23   Ardith Dark, MD  montelukast (SINGULAIR) 10 MG tablet TAKE 1 TABLET BY MOUTH AT BEDTIME 01/11/22   Ardith Dark, MD  nystatin cream (MYCOSTATIN) Apply topically 2 (two) times daily. 11/24/21   [provider]  oxyCODONE (ROXICODONE) 5 MG immediate release tablet Take 1 tablet (5 mg  total) by mouth every 6 (six) hours as needed for severe pain (pain score 7-10). 05/14/23   Rondel Baton, MD  QUEtiapine (SEROQUEL) 50 MG tablet Take 1 tablet (50 mg total) by mouth at bedtime. 03/12/23   Ardith Dark, MD  sacubitril-valsartan Sherryll Burger) 24-26 MG Take 1 tablet by mouth twice daily 02/12/23   Bensimhon, Bevelyn Buckles, MD  triamcinolone cream (KENALOG) 0.1 % Apply topically 2 (two) times daily. 11/24/21   [provider]      Allergies    Contrast media [iodinated contrast media], Gadolinium, and Iodine-131    Review of Systems   Review of Systems  Physical Exam Updated Vital Signs BP 111/64   Pulse 80   Temp 98.1 F (36.7 C)   Resp 16   Ht 5\' 5"  (1.651 m)   Wt 64 kg   SpO2  94%   BMI 23.46 kg/m  Physical Exam Vitals and nursing note reviewed.  Constitutional:      General: She is not in acute distress.    Appearance: She is well-developed.  HENT:     Head: Normocephalic and atraumatic.     Right Ear: External ear normal.     Left Ear: External ear normal.     Nose: Nose normal.  Eyes:     Extraocular Movements: Extraocular movements intact.     Conjunctiva/sclera: Conjunctivae normal.     Pupils: Pupils are equal, round, and reactive to light.  Cardiovascular:     Rate and Rhythm: Normal rate and regular rhythm.     Heart sounds: No murmur heard. Pulmonary:     Effort: Pulmonary effort is normal. No respiratory distress.     Breath sounds: Normal breath sounds.  Musculoskeletal:     Cervical back: Normal range of motion and neck supple.     Right lower leg: No edema.     Left lower leg: No edema.     Comments: No cervical or lumbar tenderness to palpation in the midline spine.  Tenderness to palpation of thoracic spine at approximately T3.  No step-offs noted.  Tenderness palpation paraspinally as well.  No significant tenderness palpation of the ribs.  Skin:    General: Skin is warm and dry.  Neurological:     Mental Status: She is alert and oriented to person, place, and time. Mental status is at baseline.     Sensory: No sensory deficit.     Motor: No weakness.  Psychiatric:        Mood and Affect: Mood normal.     ED Results / Procedures / Treatments   Labs (all labs ordered are listed, but only abnormal results are displayed) Labs Reviewed - No data to display  EKG None  Radiology CT Chest Wo Contrast Result Date: 05/14/2023 CLINICAL DATA:  fall, back pain, rib pain. EXAM: CT CHEST WITHOUT CONTRAST TECHNIQUE: Multidetector CT imaging of the chest was performed following the standard protocol without IV contrast. RADIATION DOSE REDUCTION: This exam was performed according to the departmental dose-optimization program which includes  automated exposure control, adjustment of the mA and/or kV according to patient size and/or use of iterative reconstruction technique. COMPARISON:  PET-CT scan from 12/07/2022. FINDINGS: Cardiovascular: Normal cardiac size. No pericardial effusion. No aortic aneurysm. There are coronary artery calcifications, in keeping with coronary artery disease. There are also mild-to-moderate peripheral atherosclerotic vascular calcifications of thoracic aorta and its major branches. Mediastinum/Nodes: Visualized thyroid gland appears grossly unremarkable. No solid / cystic mediastinal masses. The esophagus  is nondistended precluding optimal assessment. No mediastinal or axillary lymphadenopathy by size criteria. Evaluation of bilateral hila is limited due to lack on intravenous contrast: however, no large hilar lymphadenopathy identified. Lungs/Pleura: The central tracheo-bronchial tree is patent. There are patchy areas of linear, plate-like atelectasis and/or scarring throughout bilateral lungs. The sharply marginated opacity seen on the chest radiograph performed earlier the same day overlying the left lower lung zone corresponds to peripheral subsegmental atelectasis in the lingular segment of left upper lobe. There is no lung contusion, consolidation or focal mass. No pleural effusion or pneumothorax. There are stable 2 adjacent solid noncalcified nodules in the right lung lower lobe with largest measuring up to 3 x 5 mm. These are unchanged since the prior study from 12/07/2020. No further follow-up is recommended. There is a stable, 5 mm noncalcified opacity in the left major fissure (series 2006, image 54), favored to represent intra fissural lymph node. No new or suspicious lung nodule. Upper Abdomen: Surgically absent gallbladder. Remaining visualized upper abdominal viscera within normal limits. Musculoskeletal: The visualized soft tissues of the chest wall are grossly unremarkable. No suspicious osseous lesions.  There are mild degenerative changes of the thoracic spine. T5 and T7 anterior wedging compression fractures noted. Please refer to same-day acquired CT scan thoracic spine report for details. Old/healed posterior right fifth and sixth rib fractures noted. No acute rib fracture seen. IMPRESSION: 1. No acute traumatic injury to the chest. The sharply marginated opacity seen on the chest radiograph performed earlier the same day overlying the left lower lung zone corresponds to peripheral subsegmental atelectasis in the lingular segment of left upper lobe. There is no lung contusion, consolidation or focal mass. 2. Multiple other nonacute observations, as described above. Aortic Atherosclerosis (ICD10-I70.0). Electronically Signed   By: Jules Schick M.D.   On: 05/14/2023 11:38   CT T-SPINE NO CHARGE Result Date: 05/14/2023 CLINICAL DATA:  Back pain after fall EXAM: CT Thoracic Spine without contrast TECHNIQUE: Multiplanar CT images of the thoracic spine were reconstructed from contemporary CT of the Chest. RADIATION DOSE REDUCTION: This exam was performed according to the departmental dose-optimization program which includes automated exposure control, adjustment of the mA and/or kV according to patient size and/or use of iterative reconstruction technique. CONTRAST:  None COMPARISON:  PET CT 12/07/2022 FINDINGS: Alignment: Exaggerated thoracic kyphosis Vertebrae: T5 and T7 compression fracture with advanced height loss. Mild retropulsion of the posteroinferior corner of T7. The T5 fracture has a chronic CT appearance with completed healing. This T5 fracture was also seen on a 12/07/2022 PET CT with reformats generated in PACS. The T7 fracture is new from that time but is nonacute in appearance with gas containing cleft and sclerosis. Nondisplaced fracture of the T7 spinous process. No listhesis or evidence of bone lesion. Generalized osteopenia. Paraspinal and other soft tissues: Mild paravertebral edema around  the recent fracture. Disc levels: Less than typical degenerative change. There is some foraminal narrowing at T7-10 due to disc height loss. IMPRESSION: 1. Nonacute but unhealed T7 compression fracture with advanced height loss. Gas is seen within the fracture cleft and there is still paravertebral edema. This fracture is new from 12/07/2022 PET-CT. There is an associated nondisplaced fracture through the spinous process of T7. 2. Remote, healed T5 compression fracture with advanced height loss. Electronically Signed   By: Tiburcio Pea M.D.   On: 05/14/2023 11:10   DG Chest 2 View Result Date: 05/14/2023 CLINICAL DATA:  Midthoracic back pain, status post fall. EXAM: CHEST -  2 VIEW COMPARISON:  08/25/2022. FINDINGS: There is relatively sharply marginated approximately 2.9 x 4.0 cm opacity overlying the left lower lung zone. There is no correlate on the lateral film. There is? Minimally displaced fracture of the posterolateral left seventh rib. These constellation of findings in the given context of fall, raises the concern for probable underlying lung contusion. However, superimposed artifact remains in the differential diagnosis. Correlate clinically to determine the need for additional imaging with chest CT scan. Bilateral lung fields are otherwise clear. Bilateral costophrenic angles are clear. Normal cardio-mediastinal silhouette. No other acute osseous abnormalities. Old healed right posterior fifth, sixth rib fractures and vertebral body fractures, as described on the simultaneously acquired thoracic spine report. The soft tissues are within normal limits. IMPRESSION: *There is relatively sharply marginated approximately 2.9 x 4.0 cm opacity overlying the left lower lung zone. There is no correlate on the lateral film. There is ? Minimally displaced fracture of the posterolateral left seventh rib. These constellation of findings in the given context of fall, raises the concern for probable underlying lung  contusion. However, superimposed artifact remains in the differential diagnosis. Correlate clinically to determine the need for additional imaging with chest CT scan. Electronically Signed   By: Jules Schick M.D.   On: 05/14/2023 09:45   DG Thoracic Spine 2 View Result Date: 05/14/2023 CLINICAL DATA:  Midthoracic back pain.  Fall. EXAM: THORACIC SPINE 2 VIEWS COMPARISON:  Chest CT scan from 12/07/2020. FINDINGS: Unremarkable spinal curvature. No spondylolisthesis. There is moderate-to-severe anterior wedging deformity of T5 vertebral body which is sclerosed, similar to the prior study from 12/07/2020. However, there is severe anterior wedging deformity of T7 vertebral body which is sclerosed, age indeterminate but new since the prior CT scan. This is favored chronic in age. Correlate with point tenderness. No significant retropulsion or spinal canal compromise. Remaining vertebral body heights are maintained. No aggressive osseous lesion. Mild multilevel degenerative changes in the form of marginal osteophyte formation. Old healed fractures of posterior right fifth and sixth ribs noted. Visualized soft tissues are within normal limits. IMPRESSION: 1. Severe anterior wedging deformity of T7 vertebral body, age indeterminate but new since the prior study from 2022. This is favored chronic in age. Correlate with point tenderness. No significant retropulsion or spinal canal compromise. 2. Moderate-to-severe anterior wedging deformity of T5 vertebral body is unchanged since the prior study from October 2022. Electronically Signed   By: Jules Schick M.D.   On: 05/14/2023 09:39    Procedures Procedures    Medications Ordered in ED Medications  lidocaine (LIDODERM) 5 % 1 patch (1 patch Transdermal Patch Applied 05/14/23 0849)  oxyCODONE (Oxy IR/ROXICODONE) immediate release tablet 5 mg (5 mg Oral Given 05/14/23 0849)  ketorolac (TORADOL) 15 MG/ML injection 15 mg (15 mg Intramuscular Given 05/14/23 0849)     ED Course/ Medical Decision Making/ A&P                                 Medical Decision Making Amount and/or Complexity of Data Reviewed Radiology: ordered.  Risk Prescription drug management.   ANTIONETTE LUSTER is a 63 y.o. female with comorbidities that complicate the patient evaluation including diabetes, CHF, COPD, and chronic pain from a thoracic compression fracture who presents emergency department back pain.    Initial Ddx:  Thoracic spine fracture, spinal cord compression, rib fracture, pneumonia, pneumothorax  MDM/Course:  Patient has a history of chronic back pain  from a compression fracture that she sustained in the past.  Presents several weeks after a fall with persistent back pain.  Having some difficulty breathing due to the pain.  On exam does have some midline tenderness to palpation but no neurologic deficits to suggest spinal cord compression.  She had an x-ray that showed a possible new compression fracture at T7 with possible lung contusion.  CT was recommended for more complete evaluation by radiology.  She did have a CT scan which does show an age-indeterminate T7 fracture which is likely causing her pain.  Also did not show any rib fractures and showed some atelectasis.  Upon re-evaluation pain was significantly improved after Toradol, oxycodone, and lidocaine patches.  She reports that she has lidocaine patches at home.   Requesting short course of oxycodone which I feel is reasonable given her symptoms.  Will have her follow-up with spine surgery and her primary doctor in several days.  This patient presents to the ED for concern of complaints listed in HPI, this involves an extensive number of treatment options, and is a complaint that carries with it a high risk of complications and morbidity. Disposition including potential need for admission considered.   Dispo: DC Home. Return precautions discussed including, but not limited to, those listed in the AVS.  Allowed pt time to ask questions which were answered fully prior to dc.  Records reviewed Outpatient Clinic Notes I independently reviewed the following imaging with scope of interpretation limited to determining acute life threatening conditions related to emergency care: CT Chest and agree with the radiologist interpretation with the following exceptions: none I personally reviewed and interpreted cardiac monitoring: normal sinus rhythm  I personally reviewed and interpreted the pt's EKG: see above for interpretation  I have reviewed the patients home medications and made adjustments as needed  Portions of this note were generated with Dragon dictation software. Dictation errors may occur despite best attempts at proofreading.     Final Clinical Impression(s) / ED Diagnoses Final diagnoses:  Closed fracture of seventh thoracic vertebra, unspecified fracture morphology, initial encounter [facility])    Rx / DC Orders ED Discharge Orders          Ordered    oxyCODONE (ROXICODONE) 5 MG immediate release tablet  Every 6 hours PRN        05/14/23 1212              Rondel Baton, MD 05/14/23 1234

## 2023-05-14 NOTE — ED Notes (Signed)
 Patient Alert and oriented to baseline. Stable and ambulatory to baseline. Patient verbalized understanding of the discharge instructions.  Patient belongings were taken by the patient.

## 2023-05-14 NOTE — ED Notes (Signed)
 Pt instructed on use of incentive spirometer

## 2023-05-15 ENCOUNTER — Telehealth: Payer: Self-pay

## 2023-05-15 NOTE — Transitions of Care (Post Inpatient/ED Visit) (Signed)
   05/15/2023  Name: Emily [name] MRN: [id number] DOB: 1960-12-10  Today's TOC FU Call Status: Today's TOC FU Call Status:: Successful TOC FU Call Completed TOC FU Call Complete Date: 05/15/23 Patient's Name and Date of Birth confirmed.  Transition Care Management Follow-up Telephone Call Date of Discharge: 05/14/23 [facility] Type of Discharge: Emergency Department Reason for ED Visit: Other: (fx T7-78) How have you been since you were released from the hospital?: Better Any questions or concerns?: No  Items Reviewed: Did you receive and understand the discharge instructions provided?: Yes Medications obtained,verified, and reconciled?: Yes (Medications Reviewed) Any new allergies since your discharge?: No Dietary orders reviewed?: Yes Do you have support at home?: No  Medications Reviewed Today: Medications Reviewed Today   Medications were not reviewed in this encounter     Home Care and Equipment/Supplies: Were Home Health Services Ordered?: NA Any new equipment or medical supplies ordered?: NA  Functional Questionnaire: Do you need assistance with bathing/showering or dressing?: No Do you need assistance with meal preparation?: No Do you need assistance with eating?: No Do you have difficulty maintaining continence: No Do you need assistance with getting out of bed/getting out of a chair/moving?: No Do you have difficulty managing or taking your medications?: No  Follow up appointments reviewed: PCP Follow-up appointment confirmed?: Yes Date of PCP follow-up appointment?: 05/17/23 Follow-up Provider: [facility] Follow-up appointment confirmed?: No (patient declined appt with spine specialist) Do you need transportation to your follow-up appointment?: No Do you understand care options if your condition(s) worsen?: Yes-patient verbalized understanding    SIGNATURE Karena Addison, LPN [facility] Nurse Health Advisor Direct  Dial [phone]

## 2023-05-17 ENCOUNTER — Ambulatory Visit (INDEPENDENT_AMBULATORY_CARE_PROVIDER_SITE_OTHER): Admitting: Family Medicine

## 2023-05-17 ENCOUNTER — Encounter: Payer: Self-pay | Admitting: Family Medicine

## 2023-05-17 VITALS — BP 92/63 | HR 80 | Temp 97.2°F | Ht 65.0 in | Wt 147.6 lb

## 2023-05-17 DIAGNOSIS — M810 Age-related osteoporosis without current pathological fracture: Secondary | ICD-10-CM

## 2023-05-17 DIAGNOSIS — E1149 Type 2 diabetes mellitus with other diabetic neurological complication: Secondary | ICD-10-CM

## 2023-05-17 DIAGNOSIS — R296 Repeated falls: Secondary | ICD-10-CM

## 2023-05-17 DIAGNOSIS — S22060D Wedge compression fracture of T7-T8 vertebra, subsequent encounter for fracture with routine healing: Secondary | ICD-10-CM | POA: Diagnosis not present

## 2023-05-17 DIAGNOSIS — J449 Chronic obstructive pulmonary disease, unspecified: Secondary | ICD-10-CM

## 2023-05-17 DIAGNOSIS — Z7984 Long term (current) use of oral hypoglycemic drugs: Secondary | ICD-10-CM

## 2023-05-17 MED ORDER — QUETIAPINE FUMARATE 50 MG PO TABS: 50.0000 mg | ORAL_TABLET | Freq: Every day | ORAL | 3 refills | Status: AC

## 2023-05-17 MED ORDER — IPRATROPIUM-ALBUTEROL 0.5-2.5 (3) MG/3ML IN SOLN: 3.0000 mL | RESPIRATORY_TRACT | 1 refills | Status: DC | PRN

## 2023-05-17 NOTE — Assessment & Plan Note (Signed)
 Overall stable.  No recent flares.  Will refill her DuoNeb.  She does well with this as needed.

## 2023-05-17 NOTE — Assessment & Plan Note (Signed)
 Too early to recheck A1c today.  We last saw her a couple of months ago for this and her A1c was 9.9.  We continued her on metformin 1000 g twice daily and Jardiance 25 mg daily and started her on insulin 10 units daily.  She does report that her home sugars have been much better.  Will recheck her A1c when she comes back in a month or 2.  She will let us know if she has any elevated sugar readings at home.

## 2023-05-17 NOTE — Progress Notes (Signed)
 Emily [name] is a 63 y.o. female who presents today for an office visit.  Assessment/Plan:  New/Acute Problems: T7 Compression Fracture  Pain is improving.  We did discuss having her follow-up with orthopedics or neurosurgery however she does not wish to do this at this point.  Her pain is currently manageable with current regimen oxycodone and lidocaine patches.  She will let us know if she needs refill of either.  Anticipate this will improve over the next several weeks.  She will let us know if she changes her mind about referral.  Falls  Patient with frequent falls likely due to deconditioning and neuropathy.  We discussed referral to physical therapy however she declined.  She will let us know if she changes her mind.  Chronic Problems Addressed Today: Osteoporosis Patient reports being on Fosamax several years ago however has not been on anything recently.  We did discuss importance of treating her osteoporosis to prevent recurrent fractures such as her compression fracture that she suffered a few weeks ago. We wiill check DEXA with next mammogram.  We did discuss other treatment options including Prolia or Evenity however she would like to hold off on this for now.  We also did discuss referral for her to see osteoporosis clinic however she declined.  Type 2 diabetes mellitus with neurological complications (HCC) Too early to recheck A1c today.  We last saw her a couple of months ago for this and her A1c was 9.9.  We continued her on metformin 1000 g twice daily and Jardiance 25 mg daily and started her on insulin 10 units daily.  She does report that her home sugars have been much better.  Will recheck her A1c when she comes back in a month or 2.  She will let us know if she has any elevated sugar readings at home.  COPD (chronic obstructive pulmonary disease) (HCC) Overall stable.  No recent flares.  Will refill her DuoNeb.  She does well with this as needed.     Subjective:   HPI:  See A/P for status of chronic conditions.  Patient is here today for ED follow-up.  She was the ED 3 days ago after developing back pain after having a fall a few weeks ago.  She had imaging in the ED including CT chest and T-spine which showed age-indeterminate T7 fracture.  She was given Toradol, oxycodone, and lidocaine patches in the ED.  Her symptoms improved and she was discharged home.  She was discharged home with oxycodone.  She thinks pain has improved the last couple of days.  Still having difficulty with deep breaths and a lot of pain with coughing and sneezing but overall improving.  She does not wish to see a back doctor.  She is working with her incentive spirometer.  She request refill on DuoNebs today.       Objective:  Physical Exam: BP 92/63   Pulse 80   Temp (!) 97.2 F (36.2 C) (Temporal)   Ht 5\' 5"  (1.651 m)   Wt 147 lb 9.6 oz (67 kg)   SpO2 100%   BMI 24.56 kg/m   Gen: No acute distress, resting comfortably CV: Regular rate and rhythm with no murmurs appreciated Pulm: Normal work of breathing, clear to auscultation bilaterally with no crackles, wheezes, or rhonchi MUSCULOSKELETAL: Tenderness to palpation along upper back. Neuro: Grossly normal, moves all extremities Psych: Normal affect and thought content  Time Spent: 45 minutes of total time was spent on  the date of the encounter performing the following actions: chart review prior to seeing the patient including recent Emergency Department visit, obtaining history, performing a medically necessary exam, counseling on the treatment plan, placing orders, and documenting in our EHR.        Katina Degree. Jimmey Ralph, MD 05/17/2023 1:25 PM

## 2023-05-17 NOTE — Patient Instructions (Signed)
 It was very nice to see you today!  I will refill your nebulizer today.  We will order your bone density scan.  You can get this done with your mammogram.  Your pain should continue to improve over the next few weeks.  Please let us know if you change your mind about seeing the osteoporosis clinic or seeing physical therapy for your falls.  Return in about 3 months (around 08/17/2023) for Follow Up.   Take care, Dr Jimmey Ralph  PLEASE NOTE:  If you had any lab tests, please let us know if you have not heard back within a few days. You may see your results on mychart before we have a chance to review them but we will give you a call once they are reviewed by [place].   If we ordered any referrals today, please let us know if you have not heard from their office within the next week.   If you had any urgent prescriptions sent in today, please check with the pharmacy within an hour of our visit to make sure the prescription was transmitted appropriately.   Please try these tips to maintain a healthy lifestyle:  Eat at least 3 REAL meals and 1-2 snacks per day.  Aim for no more than 5 hours between eating.  If you eat breakfast, please do so within one hour of getting up.   Each meal should contain half fruits/vegetables, one quarter protein, and one quarter carbs (no bigger than a computer mouse)  Cut down on sweet beverages. This includes juice, soda, and sweet tea.   Drink at least 1 glass of water with each meal and aim for at least 8 glasses per day  Exercise at least 150 minutes every week.

## 2023-05-17 NOTE — Assessment & Plan Note (Signed)
 Patient reports being on Fosamax several years ago however has not been on anything recently.  We did discuss importance of treating her osteoporosis to prevent recurrent fractures such as her compression fracture that she suffered a few weeks ago. We wiill check DEXA with next mammogram.  We did discuss other treatment options including Prolia or Evenity however she would like to hold off on this for now.  We also did discuss referral for her to see osteoporosis clinic however she declined.

## 2023-05-18 ENCOUNTER — Encounter: Payer: Self-pay | Admitting: Family Medicine

## 2023-05-28 ENCOUNTER — Encounter: Payer: Self-pay | Admitting: Family Medicine

## 2023-05-29 NOTE — Telephone Encounter (Signed)
 See note

## 2023-05-30 MED ORDER — OXYCODONE HCL 5 MG PO TABS: 5.0000 mg | ORAL_TABLET | Freq: Four times a day (QID) | ORAL | 0 refills | Status: DC | PRN

## 2023-06-02 ENCOUNTER — Other Ambulatory Visit (HOSPITAL_COMMUNITY): Payer: Self-pay | Admitting: Internal Medicine

## 2023-06-02 ENCOUNTER — Other Ambulatory Visit: Payer: Self-pay | Admitting: Family Medicine

## 2023-06-14 ENCOUNTER — Inpatient Hospital Stay: Payer: Medicare Other | Admitting: Hematology

## 2023-06-14 ENCOUNTER — Inpatient Hospital Stay: Payer: Medicare Other

## 2023-06-24 ENCOUNTER — Other Ambulatory Visit: Payer: Self-pay | Admitting: Family Medicine

## 2023-07-05 MED ORDER — OXYCODONE HCL 5 MG PO TABS: 5.0000 mg | ORAL_TABLET | Freq: Four times a day (QID) | ORAL | 0 refills | Status: DC | PRN

## 2023-07-05 NOTE — Telephone Encounter (Signed)
 Will give 1 more refill however she needs to follow-up with orthopedics or a back specialist if her pain is still not improving.

## 2023-07-15 ENCOUNTER — Other Ambulatory Visit (HOSPITAL_COMMUNITY): Payer: Self-pay | Admitting: Internal Medicine

## 2023-07-20 ENCOUNTER — Ambulatory Visit (HOSPITAL_COMMUNITY)
Admission: RE | Admit: 2023-07-20 | Discharge: 2023-07-20 | Disposition: A | Discharge status: Home / Self Care | Source: Ambulatory Visit | Attending: Family Medicine | Admitting: Family Medicine

## 2023-07-20 DIAGNOSIS — Z780 Asymptomatic menopausal state: Secondary | ICD-10-CM | POA: Diagnosis not present

## 2023-07-20 DIAGNOSIS — M810 Age-related osteoporosis without current pathological fracture: Secondary | ICD-10-CM | POA: Diagnosis not present

## 2023-07-23 ENCOUNTER — Other Ambulatory Visit: Payer: Self-pay | Admitting: Family Medicine

## 2023-07-23 ENCOUNTER — Other Ambulatory Visit (HOSPITAL_COMMUNITY): Payer: Self-pay | Admitting: Internal Medicine

## 2023-07-23 DIAGNOSIS — E785 Hyperlipidemia, unspecified: Secondary | ICD-10-CM

## 2023-07-24 ENCOUNTER — Ambulatory Visit: Payer: Self-pay | Admitting: Family Medicine

## 2023-07-24 ENCOUNTER — Other Ambulatory Visit: Payer: Self-pay | Admitting: *Deleted

## 2023-07-24 DIAGNOSIS — M5440 Lumbago with sciatica, unspecified side: Secondary | ICD-10-CM

## 2023-07-24 NOTE — Telephone Encounter (Signed)
 Ok to place referral.  Jinny Mounts. Daneil Dunker, MD 07/24/2023 10:06 AM

## 2023-07-24 NOTE — Telephone Encounter (Signed)
**Note De-identified  [place] Obfuscation** Please advise 

## 2023-07-24 NOTE — Telephone Encounter (Signed)
 Referral rheumatology placed

## 2023-07-24 NOTE — Progress Notes (Signed)
 Her DEXA scan shows worsening osteoporosis.  Recommend referral to orthopedic osteoporosis clinic to discuss management options.

## 2023-08-04 ENCOUNTER — Other Ambulatory Visit: Payer: Self-pay | Admitting: Family Medicine

## 2023-08-10 ENCOUNTER — Other Ambulatory Visit (HOSPITAL_COMMUNITY): Payer: Self-pay | Admitting: Internal Medicine

## 2023-08-16 ENCOUNTER — Other Ambulatory Visit: Payer: Self-pay | Admitting: Family Medicine

## 2023-08-16 DIAGNOSIS — R062 Wheezing: Secondary | ICD-10-CM

## 2023-08-26 ENCOUNTER — Other Ambulatory Visit: Payer: Self-pay | Admitting: Family Medicine

## 2023-08-27 ENCOUNTER — Encounter: Payer: Self-pay | Admitting: Family Medicine

## 2023-08-27 ENCOUNTER — Other Ambulatory Visit: Payer: Self-pay | Admitting: Family Medicine

## 2023-08-27 NOTE — Telephone Encounter (Signed)
 Spoke with patient, patient aware it possible it will cost more in our clinic Patient will contact insurance

## 2023-09-03 ENCOUNTER — Other Ambulatory Visit: Payer: Self-pay | Admitting: Family Medicine

## 2023-09-04 ENCOUNTER — Other Ambulatory Visit: Payer: Self-pay | Admitting: Family Medicine

## 2023-09-04 ENCOUNTER — Other Ambulatory Visit (HOSPITAL_COMMUNITY): Payer: Self-pay | Admitting: Internal Medicine

## 2023-09-21 ENCOUNTER — Ambulatory Visit: Admitting: Family Medicine

## 2023-09-23 ENCOUNTER — Other Ambulatory Visit: Payer: Self-pay | Admitting: Family Medicine

## 2023-09-28 ENCOUNTER — Other Ambulatory Visit: Payer: Self-pay | Admitting: Family Medicine

## 2023-10-02 ENCOUNTER — Ambulatory Visit: Admitting: Family Medicine

## 2023-10-02 ENCOUNTER — Telehealth: Payer: Self-pay | Admitting: *Deleted

## 2023-10-02 ENCOUNTER — Ambulatory Visit: Admitting: Physician Assistant

## 2023-10-02 VITALS — BP 97/63 | HR 76 | Temp 97.2°F | Ht 65.0 in | Wt 142.4 lb

## 2023-10-02 DIAGNOSIS — Z7984 Long term (current) use of oral hypoglycemic drugs: Secondary | ICD-10-CM | POA: Diagnosis not present

## 2023-10-02 DIAGNOSIS — N390 Urinary tract infection, site not specified: Secondary | ICD-10-CM | POA: Diagnosis not present

## 2023-10-02 DIAGNOSIS — S22050A Wedge compression fracture of T5-T6 vertebra, initial encounter for closed fracture: Secondary | ICD-10-CM | POA: Diagnosis not present

## 2023-10-02 DIAGNOSIS — E1149 Type 2 diabetes mellitus with other diabetic neurological complication: Secondary | ICD-10-CM

## 2023-10-02 DIAGNOSIS — I10 Essential (primary) hypertension: Secondary | ICD-10-CM | POA: Diagnosis not present

## 2023-10-02 LAB — POCT URINALYSIS DIPSTICK
Bilirubin, UA: NEGATIVE
Blood, UA: NEGATIVE
Glucose, UA: NEGATIVE
Ketones, UA: NEGATIVE
Leukocytes, UA: NEGATIVE
Nitrite, UA: NEGATIVE
Protein, UA: NEGATIVE
Spec Grav, UA: 1.015 (ref 1.010–1.025)
Urobilinogen, UA: 0.2 U/dL
pH, UA: 5.5 (ref 5.0–8.0)

## 2023-10-02 LAB — POCT GLYCOSYLATED HEMOGLOBIN (HGB A1C): Hemoglobin A1C: 9.9 % — AB (ref 4.0–5.6)

## 2023-10-02 LAB — MICROALBUMIN / CREATININE URINE RATIO
Creatinine,U: 76 mg/dL
Microalb Creat Ratio: 44 mg/g — ABNORMAL HIGH (ref 0.0–30.0)
Microalb, Ur: 3.3 mg/dL — ABNORMAL HIGH (ref 0.0–1.9)

## 2023-10-02 MED ORDER — AMOXICILLIN 875 MG PO TABS: 875.0000 mg | ORAL_TABLET | Freq: Two times a day (BID) | ORAL | 0 refills | Status: DC

## 2023-10-02 MED ORDER — OXYCODONE HCL 5 MG PO TABS: 5.0000 mg | ORAL_TABLET | Freq: Four times a day (QID) | ORAL | 0 refills | Status: DC | PRN

## 2023-10-02 NOTE — Telephone Encounter (Signed)
 She is already on BuSpar .  We discussed at her office visit today.  She can discuss further with her pharmacist or come back here for another visit if she wishes.

## 2023-10-02 NOTE — Assessment & Plan Note (Signed)
 At goal today on Entresto  49-51 twice daily and Coreg  6.25 mg twice daily.  She will continue to monitor at home.

## 2023-10-02 NOTE — Telephone Encounter (Signed)
 Copied from CRM #[id number]. Topic: Clinical - Prescription Issue >> Oct 01, 2023 11:01 AM Deaijah H wrote: Reason for CRM: [facility] Pharmacist w/ [employer] called in requesting to speak with nurse in regards to medication that increase risk for confusion and sedation. hydrOXYzine  (ATARAX ) 50 MG tablet, oxyCODONE  (ROXICODONE ) 5 MG immediate release tablet, LORazepam  (ATIVAN ) 1 MG tablet. Safer alternatives recommend busPIRone  (BUSPAR ) 5 MG tablet for lorazepam /hydro & for oxyCODONE  (ROXICODONE ) 5 MG immediate release tablet tylenol  recommendation. Please call [phone]   Spoke with pharmacy stated if is ok to change Buspar  for lorazepam  due to side effect,confusion and sedation, Please advise  Mercedes Fort,RMA

## 2023-10-02 NOTE — Patient Instructions (Signed)
 It was very nice to see you today!  VISIT SUMMARY:  Today, we addressed your concerns about blood pressure, medication issues, diabetes management, chronic back pain, anxiety, breathing difficulties, and upcoming dental extractions.  YOUR PLAN:  TYPE 2 DIABETES MELLITUS: Your blood sugar levels are higher than desired, and your A1c is 9.9%. -Increase Tresiba  to 15 units daily. -Monitor your blood sugar consistently, especially fasting sugars. -Recheck A1c in 3 months. -Send a message in 1-2 weeks with your blood sugar readings for further Tresiba  adjustment.  CHRONIC LOW BACK PAIN: Your back pain is managed with oxycodone , which is effective but causes itching. -Refill oxycodone  prescription.  GENERALIZED ANXIETY DISORDER: . -Your lorazepam  prescription is refilled and available for pickup.  DENTAL EXTRACTIONS SCHEDULED: You have significant oral swelling and need antibiotics before your dental procedure. -Prescribe amoxicillin  before dental extractions.  Return in about 3 months (around 01/02/2024) for Follow Up.   Take care, Dr Kennyth  PLEASE NOTE:  If you had any lab tests, please let us  know if you have not heard back within a few days. You may see your results on mychart before we have a chance to review them but we will give you a call once they are reviewed by us .   If we ordered any referrals today, please let us  know if you have not heard from their office within the next week.   If you had any urgent prescriptions sent in today, please check with the pharmacy within an hour of our visit to make sure the prescription was transmitted appropriately.   Please try these tips to maintain a healthy lifestyle:  Eat at least 3 REAL meals and 1-2 snacks per day.  Aim for no more than 5 hours between eating.  If you eat breakfast, please do so within one hour of getting up.   Each meal should contain half fruits/vegetables, one quarter protein, and one quarter carbs (no bigger  than a computer mouse)  Cut down on sweet beverages. This includes juice, soda, and sweet tea.   Drink at least 1 glass of water with each meal and aim for at least 8 glasses per day  Exercise at least 150 minutes every week.

## 2023-10-02 NOTE — Assessment & Plan Note (Signed)
 A1c uncontrolled 9.9.  Has had several dietary indiscretions recently including increased ice cream take.  She is not checking her sugar routinely at home though her fasting sugars usually in the 200s we will increase her Tresiba  to 15 units daily.  Continue metformin  1000 mg twice daily and and Jardiance  25 mg daily.  She will follow-up with us  in a couple weeks we will adjust the dose of tresiba  as needed.  Recheck A1c in 3 months.

## 2023-10-02 NOTE — Progress Notes (Signed)
   Emily [name] is a 63 y.o. female who presents today for an office visit.  Assessment/Plan:  New/Acute Problems: Urinary tract infection Symptoms  Symptoms have resolved with over-the-counter treatment.  Will check UA and urine culture today to confirm eradication of UTI.  Dental Pain  Has appointment next week for dental extraction.  Will send in course of amoxicillin .  She has done well with this in the past.  Chronic Problems Addressed Today: Type 2 diabetes mellitus with neurological complications (HCC) A1c uncontrolled 9.9.  Has had several dietary indiscretions recently including increased ice cream take.  She is not checking her sugar routinely at home though her fasting sugars usually in the 200s we will increase her Tresiba  to 15 units daily.  Continue metformin  1000 mg twice daily and and Jardiance  25 mg daily.  She will follow-up with us  in a couple weeks we will adjust the dose of tresiba  as needed.  Recheck A1c in 3 months.  Essential hypertension At goal today on Entresto  49-51 twice daily and Coreg  6.25 mg twice daily.  She will continue to monitor at home.  Chronic pain secondary to Thoracic compression fracture (HCC) On oxycodone  as needed.  Tolerates well.  Medications help with managing her pain.  She has mild itching with this but otherwise tolerates well.  Medications do help with her pain and ability to perform activities of daily living.  She has limited options for pain due to CKD and cardiac history.  Will refill today.  Last refill was 3 months ago.  Recheck in 3 months.     Subjective:  HPI:  See assessment / plan for status of chronic conditions.   Discussed the use of AI scribe software for clinical note transcription with the patient, who gave verbal consent to proceed.  History of Present Illness Emily [name] is a 63 year old female with diabetes and hypertension who presents for a follow-up visit.  She recently experienced a urinary tract  infection with difficulty urinating and pain, which she self-treated with over-the-counter medication. The symptoms have since resolved.  She is scheduled for a full dental extraction and is experiencing significant oral swelling. She anticipates needing antibiotics prior to the procedure.  She takes oxycodone  for chronic back pain, which is effective but causes itching, particularly on her face. She takes it in the afternoon to manage her pain and allow her to rest.  Her diabetes management includes metformin , Jardiance , and Tresiba  insulin . She does not check her blood sugar as frequently as recommended, with readings often in the 200s. Her A1c is 9.9. She has been consuming more ice cream due to dental issues, affecting her blood sugar control.         Objective:  Physical Exam: BP 97/63   Pulse 76   Temp (!) 97.2 F (36.2 C) (Temporal)   Ht 5' 5 (1.651 m)   Wt 142 lb 6.4 oz (64.6 kg)   SpO2 97%   BMI 23.70 kg/m   Gen: No acute distress, resting comfortably CV: Regular rate and rhythm with no murmurs appreciated Pulm: Normal work of breathing, clear to auscultation bilaterally with no crackles, wheezes, or rhonchi Neuro: Grossly normal, moves all extremities Psych: Normal affect and thought content      Kaci Freel M. Kennyth, MD 10/02/2023 12:11 PM

## 2023-10-02 NOTE — Assessment & Plan Note (Signed)
 On oxycodone  as needed.  Tolerates well.  Medications help with managing her pain.  She has mild itching with this but otherwise tolerates well.  Medications do help with her pain and ability to perform activities of daily living.  She has limited options for pain due to CKD and cardiac history.  Will refill today.  Last refill was 3 months ago.  Recheck in 3 months.

## 2023-10-03 ENCOUNTER — Ambulatory Visit

## 2023-10-03 DIAGNOSIS — E1149 Type 2 diabetes mellitus with other diabetic neurological complication: Secondary | ICD-10-CM
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
## 2023-10-03 NOTE — Patient Instructions (Signed)
 Ms. Ke , Thank you for taking time out of your busy schedule to complete your Annual Wellness Visit with me. I enjoyed our conversation and look forward to speaking with you again next year. I, as well as your care team,  appreciate your ongoing commitment to your health goals. Please review the following plan we discussed and let me know if I can assist you in the future. Your Game plan/ To Do List    Referrals: If you haven't heard from the office you've been referred to, please reach out to them at the phone provided.   Follow up Visits: We will see or speak with you next year for your Next Medicare AWV with our clinical staff Have you seen your provider in the last 6 months (3 months if uncontrolled diabetes)? Yes  Clinician Recommendations:  Each day, aim for 6 glasses of water, plenty of protein in your diet and try to get up and walk/ stretch every hour for 5-10 minutes at a time.  If you wish to quit smoking, help is available. For free tobacco cessation program offerings call the [facility] at 703-143-7831 or Live Well Line at 228-556-4038. You may also visit www.[place].com or email livelifewell@[place] .com for more information on other programs.   You may also call 1-800-QUIT-NOW (707-487-8330) or visit www.[place].ch or www.Become[facility]Ex.org for additional resources on smoking cessation.        This is a list of the screenings recommended for you:  Health Maintenance  Topic Date Due   Pneumococcal Vaccine for age over 75 (3 of 3 - PCV20 or PCV21) 08/05/2020   Complete foot exam   11/05/2021   Eye exam for diabetics  12/16/2022   Yearly kidney function blood test for diabetes  08/26/2023   Medicare Annual Wellness Visit  09/25/2023   Flu Shot  09/28/2023   COVID-19 Vaccine (5 - 2024-25 season) 10/18/2023*   Hepatitis B Vaccine (3 of 3 - 19+ 3-dose series) 11/17/2023   Hemoglobin A1C  04/03/2024   Screening for Lung Cancer  05/13/2024   Yearly  kidney health urinalysis for diabetes  10/01/2024   Mammogram  10/02/2024   Cologuard (Stool DNA test)  08/27/2025   DTaP/Tdap/Td vaccine (3 - Td or Tdap) 11/11/2031   DEXA scan (bone density measurement)  07/19/2033   Hepatitis C Screening  Completed   HIV Screening  Completed   Zoster (Shingles) Vaccine  Completed   HPV Vaccine  Aged Out   Meningitis B Vaccine  Aged Out   Colon Cancer Screening  Discontinued  *Topic was postponed. The date shown is not the original due date.    Advanced directives: (In Chart) A copy of your advanced directives are scanned into your chart should your provider ever need it. Advance Care Planning is important because it:  [x]  Makes sure you receive the medical care that is consistent with your values, goals, and preferences  [x]  It provides guidance to your family and loved ones and reduces their decisional burden about whether or not they are making the right decisions based on your wishes.  Follow the link provided in your after visit summary or read over the paperwork we have mailed to you to help you started getting your Advance Directives in place. If you need assistance in completing these, please reach out to us  so that we can help you!  See attachments for Preventive Care and Fall Prevention Tips.

## 2023-10-04 ENCOUNTER — Ambulatory Visit: Payer: Self-pay | Admitting: Family Medicine

## 2023-10-04 LAB — URINE CULTURE
MICRO NUMBER:: 16789119
SPECIMEN QUALITY:: ADEQUATE

## 2023-10-04 NOTE — Progress Notes (Signed)
 Her urine sample still shows urinary tract infection. Recommend we start Macrobid  100 mg twice daily x 7 days. Please send in new prescription.

## 2023-10-05 ENCOUNTER — Other Ambulatory Visit: Payer: Self-pay | Admitting: Family Medicine

## 2023-10-05 MED ORDER — NITROFURANTOIN MONOHYD MACRO 100 MG PO CAPS: 100.0000 mg | ORAL_CAPSULE | Freq: Two times a day (BID) | ORAL | 0 refills | Status: DC

## 2023-10-19 ENCOUNTER — Encounter: Payer: Self-pay | Admitting: Family Medicine

## 2023-10-19 NOTE — Telephone Encounter (Signed)
 I appreciate the update.  Can we verify that she has been taking Tresiba  15 units?  If so recommend she increase to 18 units and follow-up again with us  in a few weeks.  Please send any refills that she needs.

## 2023-10-19 NOTE — Telephone Encounter (Signed)
See patient note

## 2023-10-22 ENCOUNTER — Other Ambulatory Visit: Payer: Self-pay | Admitting: Family Medicine

## 2023-10-24 ENCOUNTER — Other Ambulatory Visit (HOSPITAL_COMMUNITY): Payer: Self-pay | Admitting: Internal Medicine

## 2023-10-24 DIAGNOSIS — E785 Hyperlipidemia, unspecified: Secondary | ICD-10-CM

## 2023-10-25 ENCOUNTER — Other Ambulatory Visit (HOSPITAL_COMMUNITY): Payer: Self-pay

## 2023-10-25 DIAGNOSIS — I5022 Chronic systolic (congestive) heart failure: Secondary | ICD-10-CM

## 2023-10-25 MED ORDER — EMPAGLIFLOZIN 25 MG PO TABS: 25.0000 mg | ORAL_TABLET | Freq: Every day | ORAL | 1 refills | Status: AC

## 2023-10-28 ENCOUNTER — Encounter: Payer: Self-pay | Admitting: Family Medicine

## 2023-10-30 ENCOUNTER — Other Ambulatory Visit: Payer: Self-pay | Admitting: Family Medicine

## 2023-10-30 NOTE — Telephone Encounter (Signed)
 Rx was send in today 10/30/2023

## 2023-11-01 ENCOUNTER — Other Ambulatory Visit: Payer: Self-pay | Admitting: Family Medicine

## 2023-11-03 ENCOUNTER — Other Ambulatory Visit: Payer: Self-pay | Admitting: Family Medicine

## 2023-11-18 ENCOUNTER — Other Ambulatory Visit: Payer: Self-pay | Admitting: Family Medicine

## 2023-11-29 ENCOUNTER — Other Ambulatory Visit: Payer: Self-pay | Admitting: Family Medicine

## 2023-12-01 ENCOUNTER — Other Ambulatory Visit: Payer: Self-pay | Admitting: Family Medicine

## 2023-12-04 ENCOUNTER — Other Ambulatory Visit: Payer: Self-pay | Admitting: *Deleted

## 2023-12-04 ENCOUNTER — Other Ambulatory Visit: Payer: Self-pay | Admitting: Family Medicine

## 2023-12-04 ENCOUNTER — Encounter: Payer: Self-pay | Admitting: Family Medicine

## 2023-12-04 MED ORDER — TRESIBA FLEXTOUCH 100 UNIT/ML ~~LOC~~ SOPN: 20.0000 [IU] | PEN_INJECTOR | Freq: Every day | SUBCUTANEOUS | 5 refills | Status: DC

## 2023-12-04 NOTE — Telephone Encounter (Signed)
**Note De-identified  [place] Obfuscation** Please advise 

## 2023-12-04 NOTE — Telephone Encounter (Signed)
 Recommend she increase tresiba  to 20 units daily.  Please send in new prescription and make sure she has a follow up appointment sch yeaheduled here.   Worth HERO. Kennyth, MD 12/04/2023 12:32 PM

## 2023-12-06 ENCOUNTER — Other Ambulatory Visit: Payer: Self-pay | Admitting: *Deleted

## 2023-12-06 MED ORDER — GLUCOSE BLOOD VI STRP: ORAL_STRIP | 12 refills | Status: DC

## 2023-12-11 ENCOUNTER — Other Ambulatory Visit: Payer: Self-pay | Admitting: *Deleted

## 2023-12-11 MED ORDER — GLUCOSE BLOOD VI STRP: ORAL_STRIP | 12 refills | Status: DC

## 2023-12-16 ENCOUNTER — Other Ambulatory Visit: Payer: Self-pay | Admitting: Family Medicine

## 2023-12-17 NOTE — Progress Notes (Signed)
 Emily [name]                                          MRN: [id number]   12/17/2023   The [facility] Quality Team Specialist reviewed this patient medical record for the purposes of chart review for care gap closure. The following were reviewed: chart review for care gap closure-diabetic eye exam, glycemic status assessment, and kidney health evaluation for diabetes:eGFR  and uACR. A1c 9.9.    [facility]I Quality Team

## 2023-12-30 ENCOUNTER — Other Ambulatory Visit: Payer: Self-pay | Admitting: Family Medicine

## 2023-12-31 ENCOUNTER — Other Ambulatory Visit: Payer: Self-pay | Admitting: Family Medicine

## 2024-01-02 ENCOUNTER — Other Ambulatory Visit (HOSPITAL_COMMUNITY): Payer: Self-pay

## 2024-01-03 ENCOUNTER — Ambulatory Visit (HOSPITAL_COMMUNITY)
Admission: RE | Admit: 2024-01-03 | Discharge: 2024-01-03 | Disposition: A | Discharge status: Home / Self Care | Source: Ambulatory Visit | Attending: Internal Medicine | Admitting: Internal Medicine

## 2024-01-03 VITALS — BP 128/74 | HR 101 | Wt 150.4 lb

## 2024-01-03 DIAGNOSIS — I6529 Occlusion and stenosis of unspecified carotid artery: Secondary | ICD-10-CM | POA: Diagnosis not present

## 2024-01-03 DIAGNOSIS — I959 Hypotension, unspecified: Secondary | ICD-10-CM | POA: Diagnosis not present

## 2024-01-03 DIAGNOSIS — R0600 Dyspnea, unspecified: Secondary | ICD-10-CM | POA: Insufficient documentation

## 2024-01-03 DIAGNOSIS — E875 Hyperkalemia: Secondary | ICD-10-CM | POA: Diagnosis not present

## 2024-01-03 DIAGNOSIS — I5042 Chronic combined systolic (congestive) and diastolic (congestive) heart failure: Secondary | ICD-10-CM | POA: Diagnosis present

## 2024-01-03 DIAGNOSIS — Z794 Long term (current) use of insulin: Secondary | ICD-10-CM | POA: Diagnosis not present

## 2024-01-03 DIAGNOSIS — E119 Type 2 diabetes mellitus without complications: Secondary | ICD-10-CM | POA: Diagnosis not present

## 2024-01-03 DIAGNOSIS — Z7982 Long term (current) use of aspirin: Secondary | ICD-10-CM | POA: Diagnosis not present

## 2024-01-03 DIAGNOSIS — I2510 Atherosclerotic heart disease of native coronary artery without angina pectoris: Secondary | ICD-10-CM | POA: Diagnosis not present

## 2024-01-03 DIAGNOSIS — Z7984 Long term (current) use of oral hypoglycemic drugs: Secondary | ICD-10-CM | POA: Diagnosis not present

## 2024-01-03 DIAGNOSIS — I5022 Chronic systolic (congestive) heart failure: Secondary | ICD-10-CM | POA: Diagnosis not present

## 2024-01-03 DIAGNOSIS — F17210 Nicotine dependence, cigarettes, uncomplicated: Secondary | ICD-10-CM | POA: Diagnosis not present

## 2024-01-03 DIAGNOSIS — I428 Other cardiomyopathies: Secondary | ICD-10-CM | POA: Diagnosis not present

## 2024-01-03 DIAGNOSIS — Z853 Personal history of malignant neoplasm of breast: Secondary | ICD-10-CM | POA: Diagnosis not present

## 2024-01-03 DIAGNOSIS — J449 Chronic obstructive pulmonary disease, unspecified: Secondary | ICD-10-CM | POA: Insufficient documentation

## 2024-01-03 DIAGNOSIS — Z720 Tobacco use: Secondary | ICD-10-CM

## 2024-01-03 LAB — BASIC METABOLIC PANEL WITH GFR
Anion gap: 13 (ref 5–15)
BUN: 16 mg/dL (ref 8–23)
CO2: 25 mmol/L (ref 22–32)
Calcium: 8.6 mg/dL — ABNORMAL LOW (ref 8.9–10.3)
Chloride: 99 mmol/L (ref 98–111)
Creatinine, Ser: 1.01 mg/dL — ABNORMAL HIGH (ref 0.44–1.00)
GFR, Estimated: >60 mL/min (ref >60)
Glucose, Bld: 169 mg/dL — ABNORMAL HIGH (ref 70–99)
Potassium: 5 mmol/L (ref 3.5–5.1)
Sodium: 137 mmol/L (ref 135–145)

## 2024-01-03 LAB — CBC
HCT: 48.6 % — ABNORMAL HIGH (ref 36.0–46.0)
Hemoglobin: 15.3 g/dL — ABNORMAL HIGH (ref 12.0–15.0)
MCH: 28.1 pg (ref 26.0–34.0)
MCHC: 31.5 g/dL (ref 30.0–36.0)
MCV: 89.3 fL (ref 80.0–100.0)
Platelets: 203 K/uL (ref 150–400)
RBC: 5.44 MIL/uL — ABNORMAL HIGH (ref 3.87–5.11)
RDW: 13.4 % (ref 11.5–15.5)
WBC: 11.9 K/uL — ABNORMAL HIGH (ref 4.0–10.5)
nRBC: 0 % (ref 0.0–0.2)

## 2024-01-03 LAB — BRAIN NATRIURETIC PEPTIDE: B Natriuretic Peptide: 9.4 pg/mL (ref 0.0–100.0)

## 2024-01-03 NOTE — Progress Notes (Signed)
 Patient ID: Emily [name], female   DOB: 1960/12/19, 63 y.o.   MRN: [id number]   ADVANCED HF CLINIC NOTE  PCP: Emily Kitty, Worth HERO, MD HF Cardiologist: Dr. Cherrie Oncologist: Dr Patrcia   Emily [name] is a 63 yo woman with a history of ER + R breast cancer s/p R lumpectomy, DM2, cholelithiasis s/p cholecystectomy (02/2013), COPD, chronic recurrent pancreatitis, and systolic CHF due to nonischemic cardiomyopathy.   Patient was admitted in 8/14 with acute pulmonary edema and cardiogenic shock.  LHC showed moderate nonobstructive CAD (40-50% mLAD, 50-70% LCx, 50% PDA).  She was started on milrinone  and diuresed.  Cause of cardiomyopathy suspected to be Adriamycin toxicity.    12/05/12 Carotid Dopplers- R 39 % stenosis and L  40-59% stenosis   Had surgery 4/17 in [place]  to remove R chest leiomyosarcoma. Margins not clear so underwent repeat resection here with Dr. Aron.    Admitted 5/17 with recurrent HF. Found to have EF 10-15% with NYHA IIIB-IV symptoms. UDS + cocaine. Cath showed non-obstructive CAD with CGS, initial PA sat 34%. Started on milrinone . Diuresed about 5 pounds. Milrinone  weaned off and co-ox dropped down to 52% but was feeling ok.   Echo 12/22/19: EF 50-55%   Echo 7/22 EF 55-60%   Admitted 4/23 with COPD exacerbation. Treated with IV steroids, and abx.   Echo 03/02/22: EF 50-55%, RV normal.   Here today for HF follow-up. Says over the last 2 months much more SOB. Now SOB with ADLs. Weight up 9 pounds. Quit smoking yesterday. + wheezing. No LE edema.    Cardidac Studies:  Echo 8/14: EF 20-25% Echo 11/14: EF 30-35% Echo 4/15: EF 45-50% septum dysynnergic Echo 5/17: EF 15-20% Echo 10/17: EF 55-60% Echo 1/19: EF 40-45%. Echo 1/20: EF 55-60%  Echo 1/24: EF 50-55%  - PFTs 1/19 FEV1 1.23 (45%) FVC 1.69 (48%) DLCO 61%  - Cath 5/17 Ao = 106/72 (87) LV =  108/28 (36) RA =  22 RV = 60/14/25 RA = 67/22 (51) PCW = 31 Fick cardiac output/index = 2.8/1.6 SVR =  1823 PVR = 7.0 WU FA sat = 90% PA sat = 34%, 42%  1. Mild non-obstructive CAD 2. Severe NICM with EF 25% by echo 3. Cardiogenic shock physiology with markedly elevated biventricular filling pressures Lexiscan 09/23/13: EF 50% normal perfusion Holter: SR occasional PVCs   Review of systems complete and found to be negative unless listed in HPI.    Current Outpatient Medications  Medication Sig Dispense Refill   albuterol  (VENTOLIN  HFA) 108 (90 Base) MCG/ACT inhaler INHALE 2 PUFFS BY MOUTH EVERY 6 HOURS AS NEEDED FOR WHEEZING FOR SHORTNESS OF BREATH 9 g 0   aspirin  EC 81 MG tablet Take 1 tablet (81 mg total) by mouth daily. 90 tablet 3   atorvastatin  (LIPITOR) 20 MG tablet Take 1 tablet (20 mg total) by mouth daily. PLEASE SCHEDULE APPOINTMENT FOR MORE REFILLS 90 tablet 0   Blood Glucose Monitoring Suppl (ONE TOUCH ULTRA MINI) w/Device KIT Use to test blood sugars daily. Dx: E11.9 1 kit 3   busPIRone  (BUSPAR ) 5 MG tablet Take 1 tablet by mouth twice daily 60 tablet 0   carvedilol  (COREG ) 6.25 MG tablet TAKE 1 TABLET BY MOUTH TWICE DAILY WITH A MEAL 180 tablet 3   citalopram  (CELEXA ) 40 MG tablet Take 1 tablet by mouth once daily 30 tablet 0   empagliflozin  (JARDIANCE ) 25 MG TABS tablet Take 1 tablet (25 mg total) by mouth daily before breakfast. 90  tablet 1   glucose blood test strip Use as instructed E11.49 100 each 12   hydrOXYzine  (ATARAX ) 50 MG tablet TAKE 1 TABLET BY MOUTH THREE TIMES DAILY AS NEEDED FOR ITCHING 90 tablet 0   insulin  degludec (TRESIBA  FLEXTOUCH) 100 UNIT/ML FlexTouch Pen Inject 20 Units into the skin daily. 6 mL 5   ipratropium-albuterol  (DUONEB) 0.5-2.5 (3) MG/3ML SOLN Take 3 mLs by nebulization every 6 (six) hours as needed.     LORazepam  (ATIVAN ) 1 MG tablet TAKE 2 TABLETS BY MOUTH AT BEDTIME AS NEEDED FOR ANXIETY DO  NOT  TAKE  WITH  PAIN  MEDICATIONS 60 tablet 0   metFORMIN  (GLUCOPHAGE -XR) 500 MG 24 hr tablet Take 2 tablets by mouth twice daily 360 tablet 0    oxyCODONE  (ROXICODONE ) 5 MG immediate release tablet Take 1 tablet (5 mg total) by mouth every 6 (six) hours as needed for severe pain (pain score 7-10). 30 tablet 0   QUEtiapine  (SEROQUEL ) 50 MG tablet Take 1 tablet (50 mg total) by mouth at bedtime. 90 tablet 3   sacubitril -valsartan  (ENTRESTO ) 24-26 MG Take 1 tablet by mouth twice daily 180 tablet 3   triamcinolone  cream (KENALOG ) 0.1 % Apply topically 2 (two) times daily.     amoxicillin  (AMOXIL ) 875 MG tablet Take 1 tablet (875 mg total) by mouth 2 (two) times daily. 14 tablet 0   montelukast  (SINGULAIR ) 10 MG tablet TAKE 1 TABLET BY MOUTH AT BEDTIME (Patient not taking: Reported on 10/03/2023) 30 tablet 0   nitrofurantoin , macrocrystal-monohydrate, (MACROBID ) 100 MG capsule Take 1 capsule (100 mg total) by mouth 2 (two) times daily. 14 capsule 0   nystatin cream (MYCOSTATIN) Apply topically 2 (two) times daily.     No current facility-administered medications for this encounter.   BP 128/74   Pulse (!) 101   Wt 68.2 kg (150 lb 6.4 oz)   SpO2 94%   BMI 25.03 kg/m   Wt Readings from Last 3 Encounters:  01/03/24 68.2 kg (150 lb 6.4 oz)  10/03/23 64.4 kg (142 lb)  10/02/23 64.6 kg (142 lb 6.4 oz)    Physical Exam General:  Sitting up in bed. No resp difficulty HEENT: normal Neck: supple. no JVD.  Cor: Regular rate & rhythm. No rubs, gallops or murmurs. Lungs: decreased throughout Abdomen: soft, nontender, nondistended.Good bowel sounds. Extremities: no cyanosis, clubbing, rash, edema Neuro: alert & orientedx3, cranial nerves grossly intact. moves all 4 extremities w/o difficulty. Affect pleasant  NSR 98 No ST abnormalities Personally reviewed  ReDS 31%  Assessment/Plan:  1. Chronic systolic HF  - NICM, likely Adriamycin toxicity - Echo 8/14 EF 20-25% - Echo 5/17 EF 15% - Echo 1/19 EF 40-45%. - Echo 7/22 EF 55-60% - Echo: 03/02/22 EF 50-55%, RV normal - NYHA III-IIIB  - Volume status ok -ReDS 31% - Continue to think her  dyspnea is more COPD than HF - Continue Entresto  24/26 mg BID (cut back due to low BP) - Continue carvedilol  6.25 mg bid.  - Continue Jardiance  25 mg daily. No GU symptoms. - Off spiro with hyperK. Will not re-challenge at this point.  - Repeat echo  - check labs - Refer back to Pulmonary   2. CAD - Moderate nonobstructive disease. - No s/s ischmeia - Continue ASA and statin  3. COPD/tobacco use - Was smoking 1 PPD - quit yesterday - Needs to f/u with Pulmonary (we will refer again)  4. Cocaine use - Remains abstinent  5. Breast Cancer - Previously received  adriamycin.  - Cancer free for > 5 years - no change  6. Carotid stenosis  - Asymptoamtice - Mild 1-39% stenosis 2/20  - On statin.   7. R chest leiomyosarcoma: - Had further resections and margins now clear.  - Sees Dr. Katragadda - PET scan ok  8. DM2 - She is on insulin . - following with PCP - Continue Jardiance .    Toribio Fuel, MD  2:29 PM

## 2024-01-03 NOTE — Progress Notes (Signed)
 ReDS Vest / Clip - 01/03/24 1400       ReDS Vest / Clip   Station Marker A    Ruler Value 30.5    ReDS Value Range Low volume    ReDS Actual Value 31

## 2024-01-03 NOTE — Patient Instructions (Signed)
 Great to see you today!!!  Medication Changes:  None, continue current medications  Lab Work:  Labs done today, your results will be available in MyChart, we will contact you for abnormal readings.   Testing/Procedures:  Your physician has requested that you have an echocardiogram. Echocardiography is a painless test that uses sound waves to create images of your heart. It provides your doctor with information about the size and shape of your heart and how well your heart's chambers and valves are working. This procedure takes approximately one hour. There are no restrictions for this procedure. Please do NOT wear cologne, perfume, aftershave, or lotions (deodorant is allowed). Please arrive 15 minutes prior to your appointment time.  Please note: We ask at that you not bring children with you during ultrasound (echo/ vascular) testing. Due to room size and safety concerns, children are not allowed in the ultrasound rooms during exams. Our front office staff cannot provide observation of children in our lobby area while testing is being conducted. An adult accompanying a patient to their appointment will only be allowed in the ultrasound room at the discretion of the ultrasound technician under special circumstances. We apologize for any inconvenience.   Referrals:  You have been referred to [facility] in [place], they will call you for an appointment   Special Instructions // Education:  Do the following things EVERYDAY: Weigh yourself in the morning before breakfast. Write it down and keep it in a log. Take your medicines as prescribed Eat low salt foods--Limit salt (sodium) to 2000 mg per day.  Stay as active as you can everyday Limit all fluids for the day to less than 2 liters'  Follow-Up in: 9 months (August 2026), **PLEASE CALL OUR OFFICE IN JUNE TO SCHEDULE THIS APPOINTMENT   At the [facility], you and your health needs are our priority. We  have a designated team specialized in the treatment of Heart Failure. This Care Team includes your primary Heart Failure Specialized Cardiologist (physician), Advanced Practice Providers (APPs- Physician Assistants and Nurse Practitioners), and Pharmacist who all work together to provide you with the care you need, when you need it.   You may see any of the following providers on your designated Care Team at your next follow up:  Dr. Toribio Fuel Dr. Ezra Shuck Dr. Odis Brownie Greig Mosses, NP Caffie Shed, [place] [facility] [place], [place] Beckey Coe, NP Jordan Lee, NP Tinnie Redman, PharmD   Please be sure to bring in all your medications bottles to every appointment.   Need to Contact Us :  If you have any questions or concerns before your next appointment please send us  a message through [place] or call our office at [phone].    TO LEAVE A MESSAGE FOR THE NURSE SELECT OPTION 2, PLEASE LEAVE A MESSAGE INCLUDING: YOUR NAME DATE OF BIRTH CALL BACK NUMBER REASON FOR CALL**this is important as we prioritize the call backs  YOU WILL RECEIVE A CALL BACK THE SAME DAY AS LONG AS YOU CALL BEFORE 4:00 PM

## 2024-01-07 ENCOUNTER — Encounter: Payer: Self-pay | Admitting: Internal Medicine

## 2024-01-07 ENCOUNTER — Ambulatory Visit (HOSPITAL_COMMUNITY)
Admission: RE | Admit: 2024-01-07 | Discharge: 2024-01-07 | Disposition: A | Discharge status: Home / Self Care | Source: Ambulatory Visit | Attending: Internal Medicine | Admitting: Internal Medicine

## 2024-01-07 ENCOUNTER — Ambulatory Visit: Admitting: Internal Medicine

## 2024-01-07 ENCOUNTER — Telehealth: Payer: Self-pay | Admitting: Internal Medicine

## 2024-01-07 VITALS — BP 93/60 | HR 46 | Ht 65.0 in | Wt 148.8 lb

## 2024-01-07 DIAGNOSIS — R0609 Other forms of dyspnea: Secondary | ICD-10-CM | POA: Diagnosis not present

## 2024-01-07 DIAGNOSIS — F17210 Nicotine dependence, cigarettes, uncomplicated: Secondary | ICD-10-CM | POA: Diagnosis not present

## 2024-01-07 NOTE — Telephone Encounter (Signed)
 Need to order pfts
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
## 2024-01-07 NOTE — Patient Instructions (Addendum)
 Please remember to go to the lab department   for your tests - we will call you with the results when they are available.      Please remember to go to the  x-ray department  @  [facility] for your tests - we will call you with the results when they are available      Please schedule a follow up office visit in 4 weeks, sooner if needed  >>> needs repeat TSH if not done in meantime/ consider trial of bronchodilators next ov

## 2024-01-07 NOTE — Assessment & Plan Note (Addendum)
 Smoker with PFT's  03/22/17   FEV1 1.42 (52 % ) ratio 0.79  p 15 % improvement from saba p 0 prior to study with DLCO  15.8 (61%)   and FV curve slt atypical  and ERV 14% at wt 160 lb   - onset aug/sept 2025 in setting of normalization of EF on Entresto  with min assoc cough - 01/07/2024   Walked on RA   x  3  lap(s) =  approx 450  ft  @ mod pace, stopped due to end of study  with lowest 02 sats 91% cc L leg pain at end s sob   - Allergy screen 01/07/2024 >  Eos 0. /  IgE  / Alpha on AT phenotype  Symptoms are markedly disproportionate to objective findings and not clear to what extent this is actually a pulmonary  problem but pt does appear to have difficult to sort out respiratory symptoms of unknown origin for which  DDX  = almost all start with A and  include Adherence, Ace Inhibitors, Acid Reflux, Active Sinus Disease, Alpha 1 Antitripsin deficiency, Anxiety/depression / deconditioning  masquerading as Airways dz,  ABPA,  Allergy(esp in young), Aspiration (esp in elderly), Adverse effects of meds,  Active smoking or Vaping, A bunch of PE's/clot burden (a few small clots can't cause this syndrome unless there is already severe underlying pulm or vascular dz with poor reserve),  Anemia or thyroid  disorder, plus two Bs  = Bronchiectasis and Beta blocker use..and one C= CHF    Dx's of most concern at this point are bolded   >>>  check labs for Alpha one phenotype, ABPA and allergy  >>>  stop all smoking > recheck PFTs next available    >>> ? Adverse drug effects from macrodantin  -  chemo also a concern but is very remote >>>   check ESR and  would avoid further use  in this setting (DOE wit borderline 02 sats with ex)  - comment:   the timing is suspicious since there may be overlap between the last days of the macordantin rx and the first days of the doe   Discussed in detail all the  indications, usual  risks and alternatives  relative to the benefits with patient who agrees to proceed with w/u s  outlined.   Each maintenance medication was reviewed in detail including emphasizing most importantly the difference between maintenance and prns and under what circumstances the prns are to be triggered using an action plan format where appropriate.  Total time for H and P, chart review, counseling,  directly observing portions of ambulatory 02 saturation study/ and generating customized AVS unique to this office visit / same day charting = 62 min complex pt new to me

## 2024-01-08 ENCOUNTER — Ambulatory Visit: Payer: Self-pay | Admitting: Internal Medicine

## 2024-01-09 ENCOUNTER — Other Ambulatory Visit: Payer: Self-pay | Admitting: *Deleted

## 2024-01-09 ENCOUNTER — Other Ambulatory Visit: Payer: Self-pay

## 2024-01-09 DIAGNOSIS — R0609 Other forms of dyspnea: Secondary | ICD-10-CM

## 2024-01-09 MED ORDER — ACCU-CHEK SOFTCLIX LANCETS MISC: 12 refills | Status: DC

## 2024-01-09 NOTE — Telephone Encounter (Signed)
 Spoke with patient regarding the Tuesday 01/22/24 2:00 pm PFT appointment at [facility] time is 1:45 pm --1st floor registration desk for check in--will mail information to patient and she voiced her understanding

## 2024-01-09 NOTE — Telephone Encounter (Signed)
 Ordered

## 2024-01-09 NOTE — Progress Notes (Signed)
 Called and spoke with pt regarding cxr , pt confirmed understanding

## 2024-01-10 ENCOUNTER — Ambulatory Visit (INDEPENDENT_AMBULATORY_CARE_PROVIDER_SITE_OTHER): Admitting: Family Medicine

## 2024-01-10 ENCOUNTER — Encounter: Payer: Self-pay | Admitting: Family Medicine

## 2024-01-10 VITALS — BP 100/60 | HR 89 | Temp 97.1°F | Ht 65.0 in | Wt 151.0 lb

## 2024-01-10 DIAGNOSIS — S22050D Wedge compression fracture of T5-T6 vertebra, subsequent encounter for fracture with routine healing: Secondary | ICD-10-CM | POA: Diagnosis not present

## 2024-01-10 DIAGNOSIS — Z794 Long term (current) use of insulin: Secondary | ICD-10-CM | POA: Diagnosis not present

## 2024-01-10 DIAGNOSIS — K861 Other chronic pancreatitis: Secondary | ICD-10-CM | POA: Diagnosis not present

## 2024-01-10 DIAGNOSIS — S22050A Wedge compression fracture of T5-T6 vertebra, initial encounter for closed fracture: Secondary | ICD-10-CM

## 2024-01-10 DIAGNOSIS — E1165 Type 2 diabetes mellitus with hyperglycemia: Secondary | ICD-10-CM

## 2024-01-10 DIAGNOSIS — N1831 Chronic kidney disease, stage 3a: Secondary | ICD-10-CM

## 2024-01-10 DIAGNOSIS — E1149 Type 2 diabetes mellitus with other diabetic neurological complication: Secondary | ICD-10-CM

## 2024-01-10 DIAGNOSIS — Z7984 Long term (current) use of oral hypoglycemic drugs: Secondary | ICD-10-CM

## 2024-01-10 DIAGNOSIS — E1122 Type 2 diabetes mellitus with diabetic chronic kidney disease: Secondary | ICD-10-CM | POA: Diagnosis not present

## 2024-01-10 LAB — POCT GLYCOSYLATED HEMOGLOBIN (HGB A1C): Hemoglobin A1C: 9.5 % — AB (ref 4.0–5.6)

## 2024-01-10 MED ORDER — BLOOD GLUCOSE MONITORING SUPPL DEVI
1.0000 | 0 refills | Status: AC
Start: 2024-01-10 — End: ?

## 2024-01-10 MED ORDER — TRESIBA FLEXTOUCH 100 UNIT/ML ~~LOC~~ SOPN: 25.0000 [IU] | PEN_INJECTOR | Freq: Every day | SUBCUTANEOUS | Status: DC

## 2024-01-10 NOTE — Assessment & Plan Note (Signed)
 Overall stable with most recent GFR's above 50 - do not need to renally dose metformin  at this time though we will continue to monitor closely.

## 2024-01-10 NOTE — Patient Instructions (Signed)
 It was very nice to see you today!  VISIT SUMMARY: Today, we discussed the management of your diabetes and reviewed your medications. We also addressed your chronic pain and made adjustments to your treatment plan.  YOUR PLAN: TYPE 2 DIABETES MELLITUS WITH DIABETIC NEUROPATHY: Your blood sugar levels are not well controlled, but there has been some improvement. You need a new glucometer to monitor your blood sugar levels at home. -Increase your Tresiba  dose to 25 units daily. -We will prescribe a new Accu-Check glucometer for you. -Please send us  your blood glucose readings after you get the new glucometer. -Schedule a follow-up appointment in three months.  Return in about 3 months (around 04/11/2024) for Follow Up.   Take care, Dr Kennyth  PLEASE NOTE:  If you had any lab tests, please let us  know if you have not heard back within a few days. You may see your results on mychart before we have a chance to review them but we will give you a call once they are reviewed by us .   If we ordered any referrals today, please let us  know if you have not heard from their office within the next week.   If you had any urgent prescriptions sent in today, please check with the pharmacy within an hour of our visit to make sure the prescription was transmitted appropriately.   Please try these tips to maintain a healthy lifestyle:  Eat at least 3 REAL meals and 1-2 snacks per day.  Aim for no more than 5 hours between eating.  If you eat breakfast, please do so within one hour of getting up.   Each meal should contain half fruits/vegetables, one quarter protein, and one quarter carbs (no bigger than a computer mouse)  Cut down on sweet beverages. This includes juice, soda, and sweet tea.   Drink at least 1 glass of water with each meal and aim for at least 8 glasses per day  Exercise at least 150 minutes every week.

## 2024-01-10 NOTE — Assessment & Plan Note (Signed)
 Uses oxycodone  sparingly as needed.  Has not needed a refill since her most recent office visit 3 months ago.  Does not need refill today.

## 2024-01-10 NOTE — Progress Notes (Addendum)
   Emily [name] is a 63 y.o. female who presents today for an office visit.  Assessment/Plan:  Chronic Problems Addressed Today: Type 2 diabetes mellitus with hyperglycemia (HCC) A1c improved slightly to 9.5.  She is still having several dietary indiscretions.  She has had trouble with routinely checking her sugar at home due to issues with her previous glucometer breaking.  Will send in new prescription for new glucometer today.  Will increase her Tresiba  to 25 units daily.  She will follow-up with us  in a few weeks via MyChart and we can adjust the dose of Tresiba  as needed.  She will continue her metformin  1000 mg twice daily and Jardiance  25 mg daily.  She will follow-up up in 3 months to recheck A1c.  Chronic recurrent pancreatitis (HCC) No recent flares but need to avoid GLPs for diabetes control.   Chronic kidney disease, stage 3a (HCC) Overall stable with most recent GFR's above 50 - do not need to renally dose metformin  at this time though we will continue to monitor closely.  Chronic pain secondary to Thoracic compression fracture [facility]) Uses oxycodone  sparingly as needed.  Has not needed a refill since her most recent office visit 3 months ago.  Does not need refill today.     Subjective:  HPI:  See assessment / plan for status of chronic conditions.    Discussed the use of AI scribe software for clinical note transcription with the patient, who gave verbal consent to proceed.  History of Present Illness Emily [name] is a 63 year old female with diabetes who presents for management of her blood sugar levels and medication review.  She has been unable to monitor her blood sugar levels at home due to outdated test strips and a lack of a compatible glucometer. Her insurance no longer covers her previous glucometer, and she requires a new Accu-Check device.  Recent blood work showed elevated blood sugar levels and high white and red blood cell counts. She has not  experienced any recent illnesses but has difficulty breathing, for which she has seen a pulmonologist. She underwent a chest x-ray and respiratory test earlier this week and is awaiting results.  She is currently taking Tresiba  at 20 units daily, Metformin , and Jardiance . Her recent A1c was 9.5, previously 9.9. She attributes some of her blood sugar challenges to dietary habits, especially during the holiday season.         Objective:  Physical Exam: BP 100/60   Pulse 89   Temp (!) 97.1 F (36.2 C) (Temporal)   Ht 5' 5 (1.651 m)   Wt 151 lb (68.5 kg)   SpO2 91%   BMI 25.13 kg/m   Gen: No acute distress, resting comfortably Neuro: Grossly normal, moves all extremities Psych: Normal affect and thought content      Karema Tocci M. Kennyth, MD 01/15/2024 9:10 AM

## 2024-01-10 NOTE — Assessment & Plan Note (Signed)
 A1c improved slightly to 9.5.  She is still having several dietary indiscretions.  She has had trouble with routinely checking her sugar at home due to issues with her previous glucometer breaking.  Will send in new prescription for new glucometer today.  Will increase her Tresiba  to 25 units daily.  She will follow-up with us  in a few weeks via MyChart and we can adjust the dose of Tresiba  as needed.  She will continue her metformin  1000 mg twice daily and Jardiance  25 mg daily.  She will follow-up up in 3 months to recheck A1c.

## 2024-01-10 NOTE — Assessment & Plan Note (Signed)
 No recent flares but need to avoid GLPs for diabetes control.

## 2024-01-11 ENCOUNTER — Encounter (HOSPITAL_COMMUNITY): Admitting: Internal Medicine

## 2024-01-11 ENCOUNTER — Ambulatory Visit: Admitting: Family Medicine

## 2024-01-15 LAB — CBC WITH DIFFERENTIAL/PLATELET
Basophils Absolute: 0.1 x10E3/uL (ref 0.0–0.2)
Basos: 0 %
EOS (ABSOLUTE): 0.4 x10E3/uL (ref 0.0–0.4)
Eos: 3 %
Hematocrit: 49.3 % — ABNORMAL HIGH (ref 34.0–46.6)
Hemoglobin: 15.9 g/dL (ref 11.1–15.9)
Immature Grans (Abs): 0 x10E3/uL (ref 0.0–0.1)
Immature Granulocytes: 0 %
Lymphocytes Absolute: 3.2 x10E3/uL — ABNORMAL HIGH (ref 0.7–3.1)
Lymphs: 26 %
MCH: 28.9 pg (ref 26.6–33.0)
MCHC: 32.3 g/dL (ref 31.5–35.7)
MCV: 90 fL (ref 79–97)
Monocytes Absolute: 0.5 x10E3/uL (ref 0.1–0.9)
Monocytes: 4 %
Neutrophils Absolute: 8 x10E3/uL — ABNORMAL HIGH (ref 1.4–7.0)
Neutrophils: 67 %
Platelets: 207 x10E3/uL (ref 150–450)
RBC: 5.51 x10E6/uL — ABNORMAL HIGH (ref 3.77–5.28)
RDW: 13 % (ref 11.7–15.4)
WBC: 12.2 x10E3/uL — ABNORMAL HIGH (ref 3.4–10.8)

## 2024-01-15 LAB — ALPHA-1-ANTITRYPSIN PHENOTYP

## 2024-01-15 LAB — SEDIMENTATION RATE: Sed Rate: 19 mm/h (ref 0–40)

## 2024-01-15 LAB — IGE: IgE (Immunoglobulin E), Serum: 129 [IU]/mL (ref 6–495)

## 2024-01-15 LAB — D-DIMER, QUANTITATIVE: D-DIMER: 0.38 mg{FEU}/L (ref 0.00–0.49)

## 2024-01-16 ENCOUNTER — Other Ambulatory Visit: Payer: Self-pay | Admitting: Family Medicine

## 2024-01-17 ENCOUNTER — Ambulatory Visit (HOSPITAL_COMMUNITY)
Admission: RE | Admit: 2024-01-17 | Discharge: 2024-01-17 | Disposition: A | Discharge status: Home / Self Care | Source: Ambulatory Visit | Attending: Internal Medicine | Admitting: Internal Medicine

## 2024-01-17 DIAGNOSIS — I5042 Chronic combined systolic (congestive) and diastolic (congestive) heart failure: Secondary | ICD-10-CM | POA: Diagnosis present

## 2024-01-17 LAB — ECHOCARDIOGRAM COMPLETE
AR max vel: 1.42 cm2
AV Area VTI: 1.5 cm2
AV Area mean vel: 1.31 cm2
AV Mean grad: 2 mmHg
AV Peak grad: 4.2 mmHg
Ao pk vel: 1.03 m/s
Area-P 1/2: 5.84 cm2
Calc EF: 51.3 %
MV VTI: 1.5 cm2
S' Lateral: 3.1 cm
Single Plane A2C EF: 56 %
Single Plane A4C EF: 46.4 %

## 2024-01-17 NOTE — Progress Notes (Signed)
 Called and relayed labs to pt - pt confirmed understanding

## 2024-01-17 NOTE — Progress Notes (Signed)
  Echocardiogram 2D Echocardiogram has been performed.  Norleen ORN [facility] 01/17/2024, 10:24 AM

## 2024-01-22 ENCOUNTER — Ambulatory Visit (HOSPITAL_COMMUNITY)
Admission: RE | Admit: 2024-01-22 | Discharge: 2024-01-22 | Disposition: A | Discharge status: Home / Self Care | Source: Ambulatory Visit | Attending: Internal Medicine | Admitting: Internal Medicine

## 2024-01-22 DIAGNOSIS — R0609 Other forms of dyspnea: Secondary | ICD-10-CM | POA: Diagnosis present

## 2024-01-22 LAB — PULMONARY FUNCTION TEST
DL/VA % pred: 89 %
DL/VA: 3.71 ml/min/mmHg/L
DLCO cor % pred: 49 %
DLCO cor: 9.95 ml/min/mmHg
DLCO unc % pred: 51 %
DLCO unc: 10.49 ml/min/mmHg
FEF 25-75 Post: 0.42 L/s
FEF 25-75 Pre: 0.54 L/s
FEF2575-%Change-Post: -21 %
FEF2575-%Pred-Post: 19 %
FEF2575-%Pred-Pre: 24 %
FEV1-%Change-Post: -3 %
FEV1-%Pred-Post: 35 %
FEV1-%Pred-Pre: 36 %
FEV1-Post: 0.89 L
FEV1-Pre: 0.91 L
FEV1FVC-%Change-Post: 2 %
FEV1FVC-%Pred-Pre: 88 %
FEV6-%Change-Post: -3 %
FEV6-%Pred-Post: 40 %
FEV6-%Pred-Pre: 41 %
FEV6-Post: 1.27 L
FEV6-Pre: 1.32 L
FEV6FVC-%Change-Post: 1 %
FEV6FVC-%Pred-Post: 103 %
FEV6FVC-%Pred-Pre: 102 %
FVC-%Change-Post: -5 %
FVC-%Pred-Post: 38 %
FVC-%Pred-Pre: 40 %
FVC-Post: 1.27 L
FVC-Pre: 1.34 L
Post FEV1/FVC ratio: 70 %
Post FEV6/FVC ratio: 100 %
Pre FEV1/FVC ratio: 68 %
Pre FEV6/FVC Ratio: 98 %
RV % pred: 114 %
RV: 2.46 L
TLC % pred: 78 %
TLC: 4.1 L

## 2024-01-22 MED ORDER — ALBUTEROL SULFATE (2.5 MG/3ML) 0.083% IN NEBU
2.5000 mg | INHALATION_SOLUTION | Freq: Once | RESPIRATORY_TRACT | Status: AC
  Administered 2024-01-22: 2.5 mg via RESPIRATORY_TRACT

## 2024-01-23 ENCOUNTER — Other Ambulatory Visit (HOSPITAL_COMMUNITY): Payer: Self-pay | Admitting: Internal Medicine

## 2024-01-23 DIAGNOSIS — E785 Hyperlipidemia, unspecified: Secondary | ICD-10-CM

## 2024-01-25 ENCOUNTER — Ambulatory Visit: Payer: Self-pay | Admitting: Internal Medicine

## 2024-01-26 ENCOUNTER — Other Ambulatory Visit: Payer: Self-pay | Admitting: Family Medicine

## 2024-01-28 ENCOUNTER — Encounter: Payer: Self-pay | Admitting: Family Medicine

## 2024-01-28 NOTE — Telephone Encounter (Signed)
 Please advise on refill. Please and thank you!

## 2024-01-28 NOTE — Progress Notes (Signed)
 Called and relayed results to pt and she confirmed understanding.

## 2024-01-29 ENCOUNTER — Encounter: Payer: Self-pay | Admitting: Family Medicine

## 2024-01-29 MED ORDER — LORAZEPAM 1 MG PO TABS: 1.0000 mg | ORAL_TABLET | Freq: Every day | ORAL | 5 refills | Status: AC

## 2024-01-29 NOTE — Addendum Note (Signed)
 Addended by: Vincentina Sollers M on: 01/29/2024 12:32 PM   Modules accepted: Orders

## 2024-01-31 NOTE — Telephone Encounter (Signed)
 I appreciate the update. Can we clarify if these are fasting levels?

## 2024-02-01 ENCOUNTER — Other Ambulatory Visit: Payer: Self-pay | Admitting: *Deleted

## 2024-02-01 MED ORDER — TRESIBA FLEXTOUCH 100 UNIT/ML ~~LOC~~ SOPN: 30.0000 [IU] | PEN_INJECTOR | Freq: Every day | SUBCUTANEOUS | 1 refills | Status: AC

## 2024-02-01 NOTE — Telephone Encounter (Signed)
 Patient successfully confirmed that the levels are from fasting. Please advise on how to proceed. Please and thank you.

## 2024-02-01 NOTE — Telephone Encounter (Signed)
 Noted. She can increase her Tresiba  to 30 units and follow up with us  in a few weeks.

## 2024-02-04 NOTE — Progress Notes (Signed)
 Emily [name]                                          MRN: [id number]   02/04/2024   The [facility] Quality Team Specialist reviewed this patient medical record for the purposes of chart review for care gap closure. The following were reviewed: abstraction for care gap closure-kidney health evaluation for diabetes:eGFR  and uACR.    [facility] Quality Team

## 2024-02-04 NOTE — Progress Notes (Signed)
 Emily [name]                                          MRN: [id number]   02/04/2024   The [facility] Quality Team Specialist reviewed this patient medical record for the purposes of chart review for care gap closure. The following were reviewed: chart review for care gap closure-glycemic status assessment. A1c out of range at 9.5    [facility] Quality Team

## 2024-02-05 ENCOUNTER — Other Ambulatory Visit: Payer: Self-pay | Admitting: *Deleted

## 2024-02-05 MED ORDER — ACCU-CHEK SOFTCLIX LANCETS MISC: 12 refills | Status: AC

## 2024-02-07 ENCOUNTER — Other Ambulatory Visit: Payer: Self-pay | Admitting: *Deleted

## 2024-02-07 ENCOUNTER — Other Ambulatory Visit: Payer: Self-pay | Admitting: Family Medicine

## 2024-02-07 MED ORDER — GLUCOSE BLOOD VI STRP: ORAL_STRIP | 12 refills | Status: DC

## 2024-02-11 ENCOUNTER — Encounter: Payer: Self-pay | Admitting: Internal Medicine

## 2024-02-11 ENCOUNTER — Telehealth: Payer: Self-pay | Admitting: Internal Medicine

## 2024-02-11 ENCOUNTER — Ambulatory Visit: Admitting: Internal Medicine

## 2024-02-11 VITALS — BP 103/61 | HR 96 | Ht 65.0 in | Wt 156.0 lb

## 2024-02-11 DIAGNOSIS — R0609 Other forms of dyspnea: Secondary | ICD-10-CM

## 2024-02-11 DIAGNOSIS — J449 Chronic obstructive pulmonary disease, unspecified: Secondary | ICD-10-CM

## 2024-02-11 DIAGNOSIS — R058 Other specified cough: Secondary | ICD-10-CM | POA: Insufficient documentation

## 2024-02-11 DIAGNOSIS — F17210 Nicotine dependence, cigarettes, uncomplicated: Secondary | ICD-10-CM | POA: Insufficient documentation

## 2024-02-11 MED ORDER — BUDESONIDE 0.25 MG/2ML IN SUSP: RESPIRATORY_TRACT | 12 refills | Status: AC

## 2024-02-11 MED ORDER — PREDNISONE 10 MG PO TABS: ORAL_TABLET | ORAL | 0 refills | Status: DC

## 2024-02-11 NOTE — Assessment & Plan Note (Addendum)
 4   min discussion re active cigarette smoking in addition to office E&M  Ask about tobacco use:   ongoing  Advise quitting   I took an extended  opportunity with this patient to outline the consequences of continued cigarette use  in airway disorders based on all the data we have from the multiple national lung health studies (perfomed over decades at millions of dollars in cost)  indicating that smoking cessation, not choice of inhalers or pulmonary physicians, is the most important aspect of her  care.   Assess willingness:  Not committed at this point Assist in quit attempt:  Per PCP when ready Arrange follow up:   Follow up per Primary Care planned   Low-dose CT lung cancer screening is recommended for patients who are 108-47 years of age with a 20+ pack-year history of smoking and who are currently smoking or quit <=15 years ago. No coughing up blood  No unintentional weight loss of > 15 pounds in the last 6 months - pt is eligible for scanning yearly until due 04/2024 / reviewed

## 2024-02-11 NOTE — Assessment & Plan Note (Addendum)
 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   These are the same pts (now being labeled as having irritable larynx syndrome by some cough centers) who not infrequently have a history of having failed to tolerate ace inhibitors( The sacubitrilat component of entresto  is not and ACEi but it does lead to higher levels of bradykinin (the culprit in ACEi related cough) because it reduces Neprilysin based clearance of bradykinin. The typical symptoms are dry daytime cough (9% per PI) or complaints of a new sensation of globus or excess PNDS)  ,  dry powder inhalers or biphosphonates or report having atypical/extraesophageal reflux symptoms from LPR (globus, throat clearing)  that don't respond to standard doses of PPI  and are easily confused as having aecopd or asthma flares by even experienced allergists/ pulmonologists (myself included).   >>> for now more of a nuisance than health threat so ok to stay on endtresto if really still needs it.          Each maintenance medication was reviewed in detail including emphasizing most importantly the difference between maintenance and prns and under what circumstances the prns are to be triggered using an action plan format where appropriate.  Total time for H and P, chart review, counseling, reviewing nebulizer device(s) and generating customized AVS unique to this office  visit / same day charting = 35 mn        Add needs  cxr to f/u CT Chest ? Lingular ATX

## 2024-02-11 NOTE — Patient Instructions (Addendum)
 Continue duoneb but change the way you take it.  1) take duoneb 1st thing in am along with budesonide  0.25 mg and repeat it 15 minutes.  2)  As needed breathing treatments   - if you can't catch your breath > try inhaler then nebulizer   - Also  Ok to try albuterol  15 min before an activity (on alternating days)  that you know would usually make you short of breath and see if it makes any difference and if makes none then don't take albuterol  after activity unless you can't catch your breath as this means it's the resting that helps, not the albuterol .  Prednisone  10 mg take  4 each am x 2 days,   2 each am x 2 days,  1 each am x 2 days and stop   Try prilosec otc 20mg   Take 30-60 min before first meal of the day and Pepcid  ac (famotidine ) 20 mg one  after supper until cough is completely gone for at least a week without the need for cough suppression     The key is to stop smoking completely before smoking completely stops you!   Please schedule a follow up visit in 3 months but call sooner if needed - bring inhalers with you

## 2024-02-11 NOTE — Telephone Encounter (Signed)
 Needs to return for cxr   in a week or 10 days to follow up after completing her prednisone    - might as well make an ov as need to recheck sats tool
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
## 2024-02-11 NOTE — Assessment & Plan Note (Addendum)
 Onset aug/sept 2025 in setting of normalization of EF on Entresto  with min assoc cough - 01/07/2024   Walked on RA   x  3  lap(s) =  approx 450  ft  @ mod pace, stopped due to end of study  with lowest 02 sats 91% cc leg pain at end   - Allergy screen 01/07/2024 >  Eos 0.4 /  IgE 129  / Alpha on AT phenotype MM - PFT's  01/22/24   FEV1 0.91 (36 % ) ratio 0.68  p 0 % improvement from saba p neb saba  prior to study with DLCO  10.5 (51%)   and FV curve classic mild concavity     Sats have been borderline which is consistent with dlco < 60% esp with activity so rec escalate rx as follows  Change duoneb to bid with bud 0.25 mg bid plus exrtra neb in between  Prednisone  10 mg take  4 each am x 2 days,   2 each am x 2 days,  1 each am x 2 days and stop

## 2024-02-12 ENCOUNTER — Other Ambulatory Visit: Payer: Self-pay | Admitting: Family Medicine

## 2024-02-12 ENCOUNTER — Other Ambulatory Visit: Payer: Self-pay | Admitting: *Deleted

## 2024-02-19 NOTE — Telephone Encounter (Signed)
 Atc x1 lmtcb

## 2024-02-24 ENCOUNTER — Other Ambulatory Visit: Payer: Self-pay | Admitting: Family Medicine

## 2024-02-26 NOTE — Progress Notes (Signed)
 Emily [name]                                          MRN: [id number]   02/26/2024   The [facility] Quality Team Specialist reviewed this patient medical record for the purposes of chart review for care gap closure. The following were reviewed: chart review for care gap closure-diabetic eye exam and glycemic status assessment.    [facility] Quality Team

## 2024-03-02 ENCOUNTER — Other Ambulatory Visit: Payer: Self-pay | Admitting: Family Medicine

## 2024-03-03 ENCOUNTER — Encounter: Payer: Self-pay | Admitting: Family Medicine

## 2024-03-05 MED ORDER — OXYCODONE HCL 5 MG PO TABS: 5.0000 mg | ORAL_TABLET | Freq: Four times a day (QID) | ORAL | 0 refills | Status: AC | PRN

## 2024-03-11 ENCOUNTER — Other Ambulatory Visit (HOSPITAL_COMMUNITY): Payer: Self-pay

## 2024-03-12 NOTE — Telephone Encounter (Signed)
 Atc x2. lmtcb

## 2024-03-15 ENCOUNTER — Other Ambulatory Visit: Payer: Self-pay

## 2024-03-15 ENCOUNTER — Inpatient Hospital Stay (HOSPITAL_COMMUNITY)
Admission: EM | Admit: 2024-03-15 | Discharge: 2024-03-17 | DRG: 190 | Disposition: A | Discharge status: Home / Self Care | Attending: Internal Medicine | Admitting: Internal Medicine

## 2024-03-15 ENCOUNTER — Emergency Department (HOSPITAL_COMMUNITY)

## 2024-03-15 DIAGNOSIS — Z90710 Acquired absence of both cervix and uterus: Secondary | ICD-10-CM

## 2024-03-15 DIAGNOSIS — Z9221 Personal history of antineoplastic chemotherapy: Secondary | ICD-10-CM

## 2024-03-15 DIAGNOSIS — G894 Chronic pain syndrome: Secondary | ICD-10-CM | POA: Diagnosis present

## 2024-03-15 DIAGNOSIS — I5022 Chronic systolic (congestive) heart failure: Secondary | ICD-10-CM

## 2024-03-15 DIAGNOSIS — Z85828 Personal history of other malignant neoplasm of skin: Secondary | ICD-10-CM

## 2024-03-15 DIAGNOSIS — F419 Anxiety disorder, unspecified: Secondary | ICD-10-CM | POA: Diagnosis present

## 2024-03-15 DIAGNOSIS — G90521 Complex regional pain syndrome I of right lower limb: Secondary | ICD-10-CM | POA: Diagnosis present

## 2024-03-15 DIAGNOSIS — E782 Mixed hyperlipidemia: Secondary | ICD-10-CM | POA: Diagnosis present

## 2024-03-15 DIAGNOSIS — Z981 Arthrodesis status: Secondary | ICD-10-CM

## 2024-03-15 DIAGNOSIS — Z91041 Radiographic dye allergy status: Secondary | ICD-10-CM

## 2024-03-15 DIAGNOSIS — Z85831 Personal history of malignant neoplasm of soft tissue: Secondary | ICD-10-CM

## 2024-03-15 DIAGNOSIS — I10 Essential (primary) hypertension: Secondary | ICD-10-CM | POA: Diagnosis present

## 2024-03-15 DIAGNOSIS — Z923 Personal history of irradiation: Secondary | ICD-10-CM | POA: Diagnosis not present

## 2024-03-15 DIAGNOSIS — Z7982 Long term (current) use of aspirin: Secondary | ICD-10-CM

## 2024-03-15 DIAGNOSIS — Z794 Long term (current) use of insulin: Secondary | ICD-10-CM | POA: Diagnosis not present

## 2024-03-15 DIAGNOSIS — M4854XA Collapsed vertebra, not elsewhere classified, thoracic region, initial encounter for fracture: Secondary | ICD-10-CM | POA: Diagnosis present

## 2024-03-15 DIAGNOSIS — Z1152 Encounter for screening for COVID-19: Secondary | ICD-10-CM | POA: Diagnosis not present

## 2024-03-15 DIAGNOSIS — Z8052 Family history of malignant neoplasm of bladder: Secondary | ICD-10-CM

## 2024-03-15 DIAGNOSIS — M810 Age-related osteoporosis without current pathological fracture: Secondary | ICD-10-CM | POA: Diagnosis present

## 2024-03-15 DIAGNOSIS — E875 Hyperkalemia: Secondary | ICD-10-CM | POA: Diagnosis present

## 2024-03-15 DIAGNOSIS — J9601 Acute respiratory failure with hypoxia: Secondary | ICD-10-CM | POA: Diagnosis present

## 2024-03-15 DIAGNOSIS — Z79899 Other long term (current) drug therapy: Secondary | ICD-10-CM

## 2024-03-15 DIAGNOSIS — Z888 Allergy status to other drugs, medicaments and biological substances status: Secondary | ICD-10-CM | POA: Diagnosis not present

## 2024-03-15 DIAGNOSIS — I110 Hypertensive heart disease with heart failure: Secondary | ICD-10-CM | POA: Diagnosis present

## 2024-03-15 DIAGNOSIS — E1165 Type 2 diabetes mellitus with hyperglycemia: Secondary | ICD-10-CM | POA: Diagnosis present

## 2024-03-15 DIAGNOSIS — Z7984 Long term (current) use of oral hypoglycemic drugs: Secondary | ICD-10-CM | POA: Diagnosis not present

## 2024-03-15 DIAGNOSIS — Z853 Personal history of malignant neoplasm of breast: Secondary | ICD-10-CM

## 2024-03-15 DIAGNOSIS — J441 Chronic obstructive pulmonary disease with (acute) exacerbation: Secondary | ICD-10-CM | POA: Diagnosis present

## 2024-03-15 DIAGNOSIS — K219 Gastro-esophageal reflux disease without esophagitis: Secondary | ICD-10-CM | POA: Diagnosis present

## 2024-03-15 DIAGNOSIS — E785 Hyperlipidemia, unspecified: Secondary | ICD-10-CM

## 2024-03-15 DIAGNOSIS — Z8041 Family history of malignant neoplasm of ovary: Secondary | ICD-10-CM

## 2024-03-15 DIAGNOSIS — S22000A Wedge compression fracture of unspecified thoracic vertebra, initial encounter for closed fracture: Secondary | ICD-10-CM | POA: Diagnosis present

## 2024-03-15 DIAGNOSIS — Z822 Family history of deafness and hearing loss: Secondary | ICD-10-CM

## 2024-03-15 DIAGNOSIS — Z8249 Family history of ischemic heart disease and other diseases of the circulatory system: Secondary | ICD-10-CM

## 2024-03-15 DIAGNOSIS — F17210 Nicotine dependence, cigarettes, uncomplicated: Secondary | ICD-10-CM | POA: Diagnosis present

## 2024-03-15 DIAGNOSIS — Z8261 Family history of arthritis: Secondary | ICD-10-CM

## 2024-03-15 DIAGNOSIS — E1140 Type 2 diabetes mellitus with diabetic neuropathy, unspecified: Secondary | ICD-10-CM | POA: Diagnosis present

## 2024-03-15 DIAGNOSIS — Z86008 Personal history of in-situ neoplasm of other site: Secondary | ICD-10-CM

## 2024-03-15 DIAGNOSIS — I5042 Chronic combined systolic (congestive) and diastolic (congestive) heart failure: Secondary | ICD-10-CM | POA: Diagnosis present

## 2024-03-15 DIAGNOSIS — Z833 Family history of diabetes mellitus: Secondary | ICD-10-CM | POA: Diagnosis not present

## 2024-03-15 DIAGNOSIS — Z811 Family history of alcohol abuse and dependence: Secondary | ICD-10-CM

## 2024-03-15 DIAGNOSIS — Z803 Family history of malignant neoplasm of breast: Secondary | ICD-10-CM

## 2024-03-15 DIAGNOSIS — Z9049 Acquired absence of other specified parts of digestive tract: Secondary | ICD-10-CM

## 2024-03-15 LAB — BASIC METABOLIC PANEL WITH GFR
Anion gap: 12 (ref 5–15)
BUN: 13 mg/dL (ref 8–23)
CO2: 25 mmol/L (ref 22–32)
Calcium: 8.8 mg/dL — ABNORMAL LOW (ref 8.9–10.3)
Chloride: 100 mmol/L (ref 98–111)
Creatinine, Ser: 0.85 mg/dL (ref 0.44–1.00)
GFR, Estimated: >60 mL/min
Glucose, Bld: 159 mg/dL — ABNORMAL HIGH (ref 70–99)
Potassium: 5.7 mmol/L — ABNORMAL HIGH (ref 3.5–5.1)
Sodium: 136 mmol/L (ref 135–145)

## 2024-03-15 LAB — POTASSIUM: Potassium: 5.4 mmol/L — ABNORMAL HIGH (ref 3.5–5.1)

## 2024-03-15 LAB — CBC WITH DIFFERENTIAL/PLATELET
Abs Immature Granulocytes: 0.04 K/uL (ref 0.00–0.07)
Basophils Absolute: 0.1 K/uL (ref 0.0–0.1)
Basophils Relative: 1 %
Eosinophils Absolute: 0.3 K/uL (ref 0.0–0.5)
Eosinophils Relative: 3 %
HCT: 46.8 % — ABNORMAL HIGH (ref 36.0–46.0)
Hemoglobin: 14.9 g/dL (ref 12.0–15.0)
Immature Granulocytes: 0 %
Lymphocytes Relative: 27 %
Lymphs Abs: 2.6 K/uL (ref 0.7–4.0)
MCH: 28.6 pg (ref 26.0–34.0)
MCHC: 31.8 g/dL (ref 30.0–36.0)
MCV: 89.8 fL (ref 80.0–100.0)
Monocytes Absolute: 0.5 K/uL (ref 0.1–1.0)
Monocytes Relative: 6 %
Neutro Abs: 6.1 K/uL (ref 1.7–7.7)
Neutrophils Relative %: 63 %
Platelets: 201 K/uL (ref 150–400)
RBC: 5.21 MIL/uL — ABNORMAL HIGH (ref 3.87–5.11)
RDW: 14.5 % (ref 11.5–15.5)
WBC: 9.6 K/uL (ref 4.0–10.5)
nRBC: 0 % (ref 0.0–0.2)

## 2024-03-15 LAB — TROPONIN T, HIGH SENSITIVITY
Troponin T High Sensitivity: 19 ng/L (ref 0–19)
Troponin T High Sensitivity: 17 ng/L (ref 0–19)

## 2024-03-15 LAB — GLUCOSE, CAPILLARY: Glucose-Capillary: 327 mg/dL — ABNORMAL HIGH (ref 70–99)

## 2024-03-15 LAB — RESP PANEL BY RT-PCR (RSV, FLU A&B, COVID)  RVPGX2
Influenza A by PCR: NEGATIVE
Influenza B by PCR: NEGATIVE
Resp Syncytial Virus by PCR: NEGATIVE
SARS Coronavirus 2 by RT PCR: NEGATIVE

## 2024-03-15 LAB — PRO BRAIN NATRIURETIC PEPTIDE: Pro Brain Natriuretic Peptide: 71.5 pg/mL

## 2024-03-15 MED ORDER — PANTOPRAZOLE SODIUM 40 MG PO TBEC
40.0000 mg | DELAYED_RELEASE_TABLET | Freq: Every day | ORAL | Status: DC
  Administered 2024-03-15 – 2024-03-17 (×3): 40 mg via ORAL
  Filled 2024-03-15 (×3): qty 1

## 2024-03-15 MED ORDER — OXYCODONE HCL 5 MG PO TABS
5.0000 mg | ORAL_TABLET | Freq: Once | ORAL | Status: AC
  Administered 2024-03-15: 5 mg via ORAL
  Filled 2024-03-15: qty 1

## 2024-03-15 MED ORDER — IPRATROPIUM-ALBUTEROL 0.5-2.5 (3) MG/3ML IN SOLN
3.0000 mL | RESPIRATORY_TRACT | Status: DC
  Administered 2024-03-15: 3 mL via RESPIRATORY_TRACT
  Filled 2024-03-15: qty 3

## 2024-03-15 MED ORDER — HYDROCODONE-ACETAMINOPHEN 5-325 MG PO TABS
1.0000 | ORAL_TABLET | ORAL | Status: DC | PRN
  Administered 2024-03-15: 1 via ORAL
  Filled 2024-03-15: qty 1

## 2024-03-15 MED ORDER — CITALOPRAM HYDROBROMIDE 20 MG PO TABS
40.0000 mg | ORAL_TABLET | Freq: Every day | ORAL | Status: DC
  Administered 2024-03-16 – 2024-03-17 (×2): 40 mg via ORAL
  Filled 2024-03-15 (×2): qty 2

## 2024-03-15 MED ORDER — EMPAGLIFLOZIN 25 MG PO TABS
25.0000 mg | ORAL_TABLET | Freq: Every day | ORAL | Status: DC
  Administered 2024-03-16 – 2024-03-17 (×2): 25 mg via ORAL
  Filled 2024-03-15 (×3): qty 1

## 2024-03-15 MED ORDER — INSULIN DEGLUDEC 100 UNIT/ML ~~LOC~~ SOPN: 20.0000 [IU] | PEN_INJECTOR | Freq: Every day | SUBCUTANEOUS | Status: DC

## 2024-03-15 MED ORDER — CARVEDILOL 3.125 MG PO TABS
6.2500 mg | ORAL_TABLET | Freq: Two times a day (BID) | ORAL | Status: DC
  Administered 2024-03-16 – 2024-03-17 (×3): 6.25 mg via ORAL
  Filled 2024-03-15 (×3): qty 2

## 2024-03-15 MED ORDER — IPRATROPIUM-ALBUTEROL 0.5-2.5 (3) MG/3ML IN SOLN
3.0000 mL | Freq: Once | RESPIRATORY_TRACT | Status: AC
  Administered 2024-03-15: 3 mL via RESPIRATORY_TRACT
  Filled 2024-03-15: qty 3

## 2024-03-15 MED ORDER — INSULIN GLARGINE 100 UNIT/ML ~~LOC~~ SOLN
20.0000 [IU] | Freq: Every day | SUBCUTANEOUS | Status: DC
  Administered 2024-03-16 – 2024-03-17 (×2): 20 [IU] via SUBCUTANEOUS
  Filled 2024-03-15 (×3): qty 0.2

## 2024-03-15 MED ORDER — SACUBITRIL-VALSARTAN 24-26 MG PO TABS: 1.0000 | ORAL_TABLET | Freq: Two times a day (BID) | ORAL | Status: DC

## 2024-03-15 MED ORDER — GUAIFENESIN ER 600 MG PO TB12
600.0000 mg | ORAL_TABLET | Freq: Two times a day (BID) | ORAL | Status: DC
  Administered 2024-03-15 – 2024-03-17 (×4): 600 mg via ORAL
  Filled 2024-03-15 (×4): qty 1

## 2024-03-15 MED ORDER — SODIUM CHLORIDE 0.9 % IV SOLN
1.0000 g | INTRAVENOUS | Status: DC
  Administered 2024-03-15 – 2024-03-16 (×2): 1 g via INTRAVENOUS
  Filled 2024-03-15 (×2): qty 10

## 2024-03-15 MED ORDER — METHYLPREDNISOLONE SODIUM SUCC 125 MG IJ SOLR
125.0000 mg | Freq: Every day | INTRAMUSCULAR | Status: DC
  Administered 2024-03-16: 125 mg via INTRAVENOUS
  Filled 2024-03-15: qty 2

## 2024-03-15 MED ORDER — ASPIRIN 81 MG PO TBEC
81.0000 mg | DELAYED_RELEASE_TABLET | Freq: Every day | ORAL | Status: DC
  Administered 2024-03-16 – 2024-03-17 (×2): 81 mg via ORAL
  Filled 2024-03-15 (×2): qty 1

## 2024-03-15 MED ORDER — HYDRALAZINE HCL 20 MG/ML IJ SOLN: 5.0000 mg | INTRAMUSCULAR | Status: DC | PRN

## 2024-03-15 MED ORDER — SODIUM ZIRCONIUM CYCLOSILICATE 5 G PO PACK
10.0000 g | PACK | Freq: Once | ORAL | Status: AC
  Administered 2024-03-15: 10 g via ORAL
  Filled 2024-03-15: qty 2

## 2024-03-15 MED ORDER — BUSPIRONE HCL 5 MG PO TABS
5.0000 mg | ORAL_TABLET | Freq: Two times a day (BID) | ORAL | Status: DC
  Administered 2024-03-15 – 2024-03-17 (×4): 5 mg via ORAL
  Filled 2024-03-15 (×4): qty 1

## 2024-03-15 MED ORDER — PREDNISONE 10 MG PO TABS: 40.0000 mg | ORAL_TABLET | Freq: Every day | ORAL | 0 refills | Status: DC

## 2024-03-15 MED ORDER — BUDESONIDE 0.25 MG/2ML IN SUSP
0.2500 mg | Freq: Two times a day (BID) | RESPIRATORY_TRACT | Status: DC
  Administered 2024-03-15 – 2024-03-17 (×4): 0.25 mg via RESPIRATORY_TRACT
  Filled 2024-03-15 (×3): qty 2

## 2024-03-15 MED ORDER — IPRATROPIUM-ALBUTEROL 0.5-2.5 (3) MG/3ML IN SOLN
3.0000 mL | Freq: Three times a day (TID) | RESPIRATORY_TRACT | Status: DC
  Administered 2024-03-16 – 2024-03-17 (×5): 3 mL via RESPIRATORY_TRACT
  Filled 2024-03-15 (×6): qty 3

## 2024-03-15 MED ORDER — ALBUTEROL SULFATE (2.5 MG/3ML) 0.083% IN NEBU: 2.5000 mg | INHALATION_SOLUTION | RESPIRATORY_TRACT | Status: DC | PRN

## 2024-03-15 MED ORDER — ENOXAPARIN SODIUM 40 MG/0.4ML IJ SOSY
40.0000 mg | PREFILLED_SYRINGE | INTRAMUSCULAR | Status: DC
  Administered 2024-03-16 – 2024-03-17 (×2): 40 mg via SUBCUTANEOUS
  Filled 2024-03-15 (×2): qty 0.4

## 2024-03-15 MED ORDER — MAGNESIUM SULFATE 2 GM/50ML IV SOLN
2.0000 g | Freq: Once | INTRAVENOUS | Status: AC
  Administered 2024-03-15: 2 g via INTRAVENOUS
  Filled 2024-03-15: qty 50

## 2024-03-15 MED ORDER — ATORVASTATIN CALCIUM 10 MG PO TABS
20.0000 mg | ORAL_TABLET | Freq: Every day | ORAL | Status: DC
  Administered 2024-03-16 – 2024-03-17 (×2): 20 mg via ORAL
  Filled 2024-03-15 (×2): qty 2

## 2024-03-15 MED ORDER — METHYLPREDNISOLONE SODIUM SUCC 125 MG IJ SOLR
125.0000 mg | Freq: Once | INTRAMUSCULAR | Status: AC
  Administered 2024-03-15: 125 mg via INTRAVENOUS
  Filled 2024-03-15: qty 2

## 2024-03-15 MED ORDER — INSULIN ASPART 100 UNIT/ML IJ SOLN
0.0000 [IU] | Freq: Three times a day (TID) | INTRAMUSCULAR | Status: DC
  Administered 2024-03-16: 15 [IU] via SUBCUTANEOUS
  Administered 2024-03-16: 8 [IU] via SUBCUTANEOUS
  Administered 2024-03-16: 5 [IU] via SUBCUTANEOUS
  Administered 2024-03-17: 3 [IU] via SUBCUTANEOUS
  Administered 2024-03-17: 5 [IU] via SUBCUTANEOUS
  Filled 2024-03-15 (×5): qty 1

## 2024-03-15 MED ORDER — ALBUTEROL SULFATE (2.5 MG/3ML) 0.083% IN NEBU
10.0000 mg/h | INHALATION_SOLUTION | Freq: Once | RESPIRATORY_TRACT | Status: AC
  Administered 2024-03-15: 10 mg/h via RESPIRATORY_TRACT
  Filled 2024-03-15: qty 12

## 2024-03-15 MED ORDER — INSULIN ASPART 100 UNIT/ML IJ SOLN
0.0000 [IU] | Freq: Every day | INTRAMUSCULAR | Status: DC
  Administered 2024-03-15: 4 [IU] via SUBCUTANEOUS
  Filled 2024-03-15: qty 1

## 2024-03-15 NOTE — ED Provider Notes (Signed)
 Signout from Dr. Norlin.  64 year old female history of COPD here with increased shortness of breath.  Has received multiple breathing treatments and steroids.  Saturations have been okay.  Plan is to follow-up on labs and response to treatment.  Anticipate will be able to be discharged. Physical Exam  BP 103/83   Pulse 89   Temp 98.3 F (36.8 C) (Oral)   Resp (!) 29   Ht 5' 5 (1.651 m)   Wt 72.6 kg   SpO2 92%   BMI 26.63 kg/m   Physical Exam  Procedures  Procedures  ED Course / MDM    Medical Decision Making Amount and/or Complexity of Data Reviewed Labs: ordered. Radiology: ordered.  Risk Prescription drug management. Decision regarding hospitalization.   Patient desatted with ambulation.  Given continuous neb and magnesium .  Ambulated again and desatted to 88%.  Discussed with [facility] hospitalist Dr. Sherlon for admission.       Towana Ozell BROCKS, MD 03/16/24 [phone]
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
## 2024-03-15 NOTE — ED Provider Notes (Signed)
 " [place] EMERGENC[facility] DEPARTMENT AT [facility] Provider Note   CSN: [id number] Arrival date & time: 03/15/24  1433     Patient presents with: Shortness of Breath   Emily [name] is a 64 y.o. female.   64 year old female presents for evaluation of shortness of breath.  She states has been going on for 3 days now.  She does have a history of COPD.  States she has been doing her breathing treatments without much relief.  She did 1 earlier today and EMS gave her 1 on the way here.  She is not on oxygen at home.  States she is also had some coughing and chest tightness as well.  Denies any other symptoms or concerns.   Shortness of Breath Associated symptoms: cough   Associated symptoms: no abdominal pain, no chest pain, no ear pain, no fever, no rash, no sore throat and no vomiting        Prior to Admission medications  Medication Sig Start Date End Date Taking? Authorizing Provider  predniSONE  (DELTASONE ) 10 MG tablet Take 4 tablets (40 mg total) by mouth daily for 4 days. 03/15/24 03/19/24 Yes Hadley Soileau L, DO  Accu-Chek Softclix Lancets lancets Use to check glucose TID or PRN 02/05/24   Kennyth Worth HERO, MD  albuterol  (VENTOLIN  HFA) 108 (90 Base) MCG/ACT inhaler INHALE 2 PUFFS BY MOUTH EVERY 6 HOURS AS NEEDED FOR WHEEZING FOR SHORTNESS OF BREATH 08/17/23   Kennyth Worth HERO, MD  aspirin  EC 81 MG tablet Take 1 tablet (81 mg total) by mouth daily. 03/27/13   Bensimhon, Toribio SAUNDERS, MD  atorvastatin  (LIPITOR) 20 MG tablet TAKE 1 TABLET BY MOUTH ONCE DAILY . APPOINTMENT REQUIRED FOR FUTURE REFILLS 01/23/24   Bensimhon, Toribio SAUNDERS, MD  Blood Glucose Monitoring Suppl DEVI 1 each by Does not apply route as directed. Dispense based on patient and insurance preference. Use up to four times daily as directed. (FOR ICD-10 E10.9, E11.9). 01/10/24   Kennyth Worth HERO, MD  budesonide  (PULMICORT ) 0.25 MG/2ML nebulizer solution Combine with albuterol  in nebulizer twice daily 02/11/24   Wert, Michael  B, MD  busPIRone  (BUSPAR ) 5 MG tablet Take 1 tablet by mouth twice daily 02/12/24   [name], Caleb M, MD  carvedilol  (COREG ) 6.25 MG tablet TAKE 1 TABLET BY MOUTH TWICE DAILY WITH A MEAL 09/04/23   Bensimhon, Toribio SAUNDERS, MD  citalopram  (CELEXA ) 40 MG tablet Take 1 tablet by mouth once daily 02/25/24   [name], Caleb M, MD  empagliflozin  (JARDIANCE ) 25 MG TABS tablet Take 1 tablet (25 mg total) by mouth daily before breakfast. 10/25/23   Bensimhon, Toribio SAUNDERS, MD  glucose blood (ACCU-CHEK GUIDE TEST) test strip Used TID or PRN for glucose checks Dx E11.65 02/07/24   Kennyth Worth HERO, MD  hydrOXYzine  (ATARAX ) 50 MG tablet TAKE 1 TABLET BY MOUTH THREE TIMES DAILY AS NEEDED FOR ITCHING 04/09/23   Kennyth Worth HERO, MD  insulin  degludec (TRESIBA  FLEXTOUCH) 100 UNIT/ML FlexTouch Pen Inject 30 Units into the skin daily. 02/01/24   Kennyth Worth HERO, MD  ipratropium-albuterol  (DUONEB) 0.5-2.5 (3) MG/3ML SOLN Take 3 mLs by nebulization every 6 (six) hours as needed.    [provider]  LORazepam  (ATIVAN ) 1 MG tablet Take 1-2 tablets (1-2 mg total) by mouth at bedtime. Do not take with pain medications. 01/29/24   Kennyth Worth HERO, MD  metFORMIN  (GLUCOPHAGE -XR) 500 MG 24 hr tablet Take 2 tablets by mouth twice daily 03/03/24   [name], Caleb M,  MD  oxyCODONE  (ROXICODONE ) 5 MG immediate release tablet Take 1 tablet (5 mg total) by mouth every 6 (six) hours as needed for severe pain (pain score 7-10). 03/05/24   Kennyth Worth HERO, MD  predniSONE  (DELTASONE ) 10 MG tablet Take  4 each am x 2 days,   2 each am x 2 days,  1 each am x 2 days and stop 02/11/24   Wert, Michael B, MD  QUEtiapine  (SEROQUEL ) 50 MG tablet Take 1 tablet (50 mg total) by mouth at bedtime. 05/17/23   Kennyth Worth HERO, MD  sacubitril -valsartan  (ENTRESTO ) 24-26 MG Take 1 tablet by mouth twice daily 08/13/23   Bensimhon, Daniel R, MD  triamcinolone  cream (KENALOG ) 0.1 % Apply topically 2 (two) times daily. 11/24/21   [provider]    Allergies:  Contrast media [iodinated contrast media], Gadolinium, and Iodine-131    Review of Systems  Constitutional:  Negative for chills and fever.  HENT:  Negative for ear pain and sore throat.   Eyes:  Negative for pain and visual disturbance.  Respiratory:  Positive for cough and shortness of breath.   Cardiovascular:  Negative for chest pain and palpitations.  Gastrointestinal:  Negative for abdominal pain and vomiting.  Genitourinary:  Negative for dysuria and hematuria.  Musculoskeletal:  Negative for arthralgias and back pain.  Skin:  Negative for color change and rash.  Neurological:  Negative for seizures and syncope.  All other systems reviewed and are negative.   Updated Vital Signs BP 103/83   Pulse 89   Temp 98.3 F (36.8 C) (Oral)   Resp (!) 29   Ht 5' 5 (1.651 m)   Wt 72.6 kg   SpO2 92%   BMI 26.63 kg/m   Physical Exam Vitals and nursing note reviewed.  Constitutional:      General: She is not in acute distress.    Appearance: She is well-developed. She is not ill-appearing.  HENT:     Head: Normocephalic and atraumatic.  Eyes:     Conjunctiva/sclera: Conjunctivae normal.  Cardiovascular:     Rate and Rhythm: Normal rate and regular rhythm.     Heart sounds: No murmur heard. Pulmonary:     Effort: Pulmonary effort is normal. Tachypnea present. No respiratory distress.     Breath sounds: Wheezing present.  Abdominal:     Palpations: Abdomen is soft.     Tenderness: There is no abdominal tenderness.  Musculoskeletal:        General: No swelling.     Cervical back: Neck supple.  Skin:    General: Skin is warm and dry.     Capillary Refill: Capillary refill takes less than 2 seconds.  Neurological:     Mental Status: She is alert.  Psychiatric:        Mood and Affect: Mood normal.     (all labs ordered are listed, but only abnormal results are displayed) Labs Reviewed  BASIC METABOLIC PANEL WITH GFR  PRO BRAIN NATRIURETIC PEPTIDE  CBC WITH  DIFFERENTIAL/PLATELET  TROPONIN T, HIGH SENSITIVITY    EKG: EKG Interpretation Date/Time:  Saturday March 15 2024 14:45:19 EST Ventricular Rate:  86 PR Interval:  145 QRS Duration:  87 QT Interval:  362 QTC Calculation: 433 R Axis:   43  Text Interpretation: Sinus rhythm Low voltage, extremity and precordial leads Probable anteroseptal infarct, old Compared with prior EKG from 01/03/2024 Confirmed by Gennaro Bouchard ([id number]) on 03/15/2024 2:51:30 PM  Radiology: No results found.   Procedures  Medications Ordered in the ED  ipratropium-albuterol  (DUONEB) 0.5-2.5 (3) MG/3ML nebulizer solution 3 mL (3 mLs Nebulization Given 03/15/24 1503)  methylPREDNISolone  sodium succinate (SOLU-MEDROL ) 125 mg/2 mL injection 125 mg (125 mg Intravenous Given 03/15/24 1456)                                    Medical Decision Making Cardiac monitor interpretation: sinus rhythm, no ectopy   Patient here for likely COPD exacerbation. Feeling better after a duoneb from EMS but still wheezing. Vitals and o2 stable. Will give her one more breathing treatment and IV steroids. Imaging and lab workup pending. Patient signed out to oncoming provider at 3:00 PM pending remainder of workup and ultimate disposition.   Problems Addressed: COPD exacerbation (HCC): acute illness or injury  Amount and/or Complexity of Data Reviewed External Data Reviewed: notes.    Details: Outpatient records reviewed and patient seen 02/11/2024 by pulmonology in the office for COPD  Labs: ordered. Decision-making details documented in ED Course.    Details: Ordered and pending Radiology: ordered and independent interpretation performed. Decision-making details documented in ED Course.    Details: Ordered and interpreted by me independently of radiology Chest x-ray: Shows no acute abnormality ECG/medicine tests: ordered and independent interpretation performed. Decision-making details documented in ED Course.    Details:  Ordered and interpreted by me in the absence of cardiology and shows sinus rhythm, no STEMI, or significant change when compared to prior EKG  Risk OTC drugs. Prescription drug management. Drug therapy requiring intensive monitoring for toxicity.    Final diagnoses:  COPD exacerbation [facility])    ED Discharge Orders          Ordered    predniSONE  (DELTASONE ) 10 MG tablet  Daily        03/15/24 1510               Lisanne Ponce, Duwaine L, DO 03/15/24 1513  "

## 2024-03-15 NOTE — ED Notes (Signed)
 While ambulating, pt dropped to 87% and felt dizzy. Pt told to breathe through nose and O2 came back to 90% but pt still felt light headed.

## 2024-03-15 NOTE — ED Triage Notes (Signed)
 Pt arrived via [facility]. Pt called EMS because she's been having shortness of breath for a week. Pt diagnosed with COPD. Pt took two duoneb treatments at home. Dizzy upon ambulation. Pt received one duoneb via EMS. Pt was dizzy when ambulating to ED bed.

## 2024-03-15 NOTE — Discharge Instructions (Addendum)
 Continue your breathing treatments every 4-6 hours as needed. Take your steroids as prescribed. Follow up with your primary care doctor and your pulmonologist in 1-2 weeks.

## 2024-03-16 ENCOUNTER — Encounter (HOSPITAL_COMMUNITY): Payer: Self-pay | Admitting: Internal Medicine

## 2024-03-16 DIAGNOSIS — J441 Chronic obstructive pulmonary disease with (acute) exacerbation: Secondary | ICD-10-CM

## 2024-03-16 LAB — CBC
HCT: 42.3 % (ref 36.0–46.0)
Hemoglobin: 13.7 g/dL (ref 12.0–15.0)
MCH: 29.1 pg (ref 26.0–34.0)
MCHC: 32.4 g/dL (ref 30.0–36.0)
MCV: 89.8 fL (ref 80.0–100.0)
Platelets: 166 K/uL (ref 150–400)
RBC: 4.71 MIL/uL (ref 3.87–5.11)
RDW: 14.2 % (ref 11.5–15.5)
WBC: 11.5 K/uL — ABNORMAL HIGH (ref 4.0–10.5)
nRBC: 0 % (ref 0.0–0.2)

## 2024-03-16 LAB — BASIC METABOLIC PANEL WITH GFR
Anion gap: 17 — ABNORMAL HIGH (ref 5–15)
BUN: 23 mg/dL (ref 8–23)
CO2: 22 mmol/L (ref 22–32)
Calcium: 8.8 mg/dL — ABNORMAL LOW (ref 8.9–10.3)
Chloride: 97 mmol/L — ABNORMAL LOW (ref 98–111)
Creatinine, Ser: 0.93 mg/dL (ref 0.44–1.00)
GFR, Estimated: >60 mL/min
Glucose, Bld: 277 mg/dL — ABNORMAL HIGH (ref 70–99)
Potassium: 4.6 mmol/L (ref 3.5–5.1)
Sodium: 136 mmol/L (ref 135–145)

## 2024-03-16 LAB — GLUCOSE, CAPILLARY
Glucose-Capillary: 268 mg/dL — ABNORMAL HIGH (ref 70–99)
Glucose-Capillary: 208 mg/dL — ABNORMAL HIGH (ref 70–99)
Glucose-Capillary: 357 mg/dL — ABNORMAL HIGH (ref 70–99)
Glucose-Capillary: 432 mg/dL — ABNORMAL HIGH (ref 70–99)

## 2024-03-16 LAB — HEMOGLOBIN A1C
Hgb A1c MFr Bld: 9.9 % — ABNORMAL HIGH (ref 4.8–5.6)
Mean Plasma Glucose: 237.43 mg/dL

## 2024-03-16 LAB — HIV ANTIBODY (ROUTINE TESTING W REFLEX): HIV Screen 4th Generation wRfx: NONREACTIVE

## 2024-03-16 MED ORDER — OXYCODONE HCL 5 MG PO TABS
5.0000 mg | ORAL_TABLET | ORAL | Status: DC | PRN
  Filled 2024-03-16: qty 1

## 2024-03-16 MED ORDER — HYDROXYZINE HCL 25 MG PO TABS
50.0000 mg | ORAL_TABLET | Freq: Three times a day (TID) | ORAL | Status: DC | PRN
  Administered 2024-03-16: 50 mg via ORAL
  Filled 2024-03-16: qty 2

## 2024-03-16 MED ORDER — INSULIN ASPART 100 UNIT/ML IJ SOLN
6.0000 [IU] | Freq: Once | INTRAMUSCULAR | Status: AC
  Administered 2024-03-16: 6 [IU] via SUBCUTANEOUS
  Filled 2024-03-16: qty 1

## 2024-03-16 MED ORDER — QUETIAPINE FUMARATE 25 MG PO TABS
50.0000 mg | ORAL_TABLET | Freq: Every day | ORAL | Status: DC
  Administered 2024-03-16: 50 mg via ORAL
  Filled 2024-03-16: qty 2

## 2024-03-16 MED ORDER — LORAZEPAM 1 MG PO TABS
1.0000 mg | ORAL_TABLET | Freq: Every day | ORAL | Status: DC
  Administered 2024-03-16: 1 mg via ORAL
  Filled 2024-03-16: qty 1

## 2024-03-16 MED ORDER — METHYLPREDNISOLONE SODIUM SUCC 125 MG IJ SOLR
40.0000 mg | Freq: Two times a day (BID) | INTRAMUSCULAR | Status: DC
  Administered 2024-03-17: 40 mg via INTRAVENOUS
  Filled 2024-03-16: qty 2

## 2024-03-16 NOTE — Progress Notes (Signed)
 " PROGRESS NOTE    Emily [name]  FMW:[id number] DOB: 1960-06-19 DOA: 03/15/2024 PCP: Kennyth Worth HERO, MD   Brief Narrative:     Emily [name]  is a 64 y.o. female, with medical history significant of hypertension, hyperlipidemia, T2DM GERD, CHF, COPD, history of with liver disease and chronic pain due to complex regional pain syndrome of right lower extremity . - Patient presents to ED secondary to complaints of shortness of breath, cough, congestion, symptoms ongoing for last 3 days, patient reports she stopped smoking a week ago, and was only smoking 1 cigarette/day till then, she presents with progressive dyspnea, cough, reporting gray/yellow in color, she does report some chest tightness related to cough and dyspnea.  Patient was admitted with acute hypoxemic respiratory failure in the setting of acute COPD exacerbation.  Assessment & Plan:   Principal Problem:   COPD exacerbation (HCC) Active Problems:   Type 2 diabetes mellitus with hyperglycemia (HCC)   GERD (gastroesophageal reflux disease)   Chronic combined systolic and diastolic CHF (congestive heart failure) (HCC)   Essential hypertension   Chronic pain secondary to Thoracic compression fracture (HCC)  Assessment and Plan:   Acute exacerbation of COPD Acute respiratory failure with hypoxia - Presents with dyspnea, wheezing, hypoxic 87% with activity, upon my evaluation she was 87% at rest -Continue with home budesonide , will change DuoNebs. - Reflectively will keep on Rocephin  - Continue with IV Solu-Medrol  40 mg IV twice daily - Continue with scheduled DuoNebs. - Continue with as needed albuterol . - Wean oxygen as tolerated - Respiratory panel negative   Essential hypertension -Continue with Coreg , will add as needed hydralazine . - Hold Entresto  due to hyperkalemia   Hyperkalemia-resolved - Continue to follow BMP   Mixed hyperlipidemia -Continue with home statin   Chronic combined systolic and diastolic  CHF -Appears to be euvolemic . - Continue with Coreg , Jardiance , will hold Entresto  due to hyperkalemia .   GERD Continue Protonix    Chronic pain syndrome - Tried Vicodin without relief, resume home oxycodone  along with Atarax  and Ativan    Type 2 diabetes mellitus with hyperglycemia -Will continue with Lantus  at a lower dose, will add insulin  sliding scale -Continue with Jardiance    History tobacco abuse -Reports she stopped smoking at new year, she was congratulated    DVT prophylaxis:Lovenox  Code Status: Full Family Communication: None at bedside Disposition Plan:  Status is: Inpatient Remains inpatient appropriate because: Need for IV medications.   Consultants:  None  Procedures:  None  Antimicrobials:  Anti-infectives (From admission, onward)    Start     Dose/Rate Route Frequency Ordered Stop   03/15/24 1830  cefTRIAXone  (ROCEPHIN ) 1 g in sodium chloride  0.9 % 100 mL IVPB        1 g 200 mL/hr over 30 Minutes Intravenous Every 24 hours 03/15/24 1825        Subjective: Patient seen and evaluated today with ongoing shortness of breath, but is slowly improving.  She is still having fits of coughing.  Objective: Vitals:   03/15/24 1745 03/15/24 1847 03/15/24 2203 03/16/24 0159  BP:  117/71 135/83 111/79  Pulse: (!) 101 (!) 104 (!) 108 (!) 101  Resp: 19 18    Temp:  99.2 F (37.3 C) 97.7 F (36.5 C) 97.9 F (36.6 C)  TempSrc:  Oral Oral Oral  SpO2: 90% 90% 91% 90%  Weight:      Height:  5' 5 (1.651 m)      Intake/Output Summary (Last 24 hours)  at 03/16/2024 1232 Last data filed at 03/16/2024 0525 Gross per 24 hour  Intake 720 ml  Output --  Net 720 ml   Filed Weights   03/15/24 1446  Weight: 72.6 kg    Examination:  General exam: Appears calm and comfortable  Respiratory system: Mild wheezing bilaterally. Respiratory effort normal.  2.5 L nasal cannula. Cardiovascular system: S1 & S2 heard, RRR.  Gastrointestinal system: Abdomen is  soft Central nervous system: Alert and awake Extremities: No edema Skin: No significant lesions noted Psychiatry: Flat affect.    Data Reviewed: I have personally reviewed following labs and imaging studies  CBC: Recent Labs  Lab 03/15/24 1451 03/16/24 0343  WBC 9.6 11.5*  NEUTROABS 6.1  --   HGB 14.9 13.7  HCT 46.8* 42.3  MCV 89.8 89.8  PLT 201 166   Basic Metabolic Panel: Recent Labs  Lab 03/15/24 1451 03/15/24 1554 03/16/24 0343  NA 136  --  136  K 5.7* 5.4* 4.6  CL 100  --  97*  CO2 25  --  22  GLUCOSE 159*  --  277*  BUN 13  --  23  CREATININE 0.85  --  0.93  CALCIUM  8.8*  --  8.8*   GFR: Estimated Creatinine Clearance: 60.2 mL/min (by C-G formula based on SCr of 0.93 mg/dL). Liver Function Tests: No results for input(s): AST, ALT, ALKPHOS, BILITOT, PROT, ALBUMIN in the last 168 hours. No results for input(s): LIPASE, AMYLASE in the last 168 hours. No results for input(s): AMMONIA in the last 168 hours. Coagulation Profile: No results for input(s): INR, PROTIME in the last 168 hours. Cardiac Enzymes: No results for input(s): CKTOTAL, CKMB, CKMBINDEX, TROPONINI in the last 168 hours. BNP (last 3 results) Recent Labs    03/15/24 1451  PROBNP 71.5   HbA1C: Recent Labs    03/16/24 0343  HGBA1C 9.9*   CBG: Recent Labs  Lab 03/15/24 2018 03/16/24 0709 03/16/24 1103  GLUCAP 327* 268* 208*   Lipid Profile: No results for input(s): CHOL, HDL, LDLCALC, TRIG, CHOLHDL, LDLDIRECT in the last 72 hours. Thyroid  Function Tests: No results for input(s): TSH, T4TOTAL, FREET4, T3FREE, THYROIDAB in the last 72 hours. Anemia Panel: No results for input(s): VITAMINB12, FOLATE, FERRITIN, TIBC, IRON, RETICCTPCT in the last 72 hours. Sepsis Labs: No results for input(s): PROCALCITON, LATICACIDVEN in the last 168 hours.  Recent Results (from the past 240 hours)  Resp panel by RT-PCR (RSV, Flu  A&B, Covid) Anterior Nasal Swab     Status: None   Collection Time: 03/15/24  4:38 PM   Specimen: Anterior Nasal Swab  Result Value Ref Range Status   SARS Coronavirus 2 by RT PCR NEGATIVE NEGATIVE Final    Comment: (NOTE) SARS-CoV-2 target nucleic acids are NOT DETECTED.  The SARS-CoV-2 RNA is generally detectable in upper respiratory specimens during the acute phase of infection. The lowest concentration of SARS-CoV-2 viral copies this assay can detect is 138 copies/mL. A negative result does not preclude SARS-Cov-2 infection and should not be used as the sole basis for treatment or other patient management decisions. A negative result may occur with  improper specimen collection/handling, submission of specimen other than nasopharyngeal swab, presence of viral mutation(s) within the areas targeted by this assay, and inadequate number of viral copies(<138 copies/mL). A negative result must be combined with clinical observations, patient history, and epidemiological information. The expected result is Negative.  Fact Sheet for Patients:  bloggercourse.com  Fact Sheet for Healthcare Providers:  seriousbroker.it  This test is no t yet approved or cleared by the [place]  FDA and  has been authorized for detection and/or diagnosis of SARS-CoV-2 by FDA under an Emergency Use Authorization (EUA). This EUA will remain  in effect (meaning this test can be used) for the duration of the COVID-19 declaration under Section 564(b)(1) of the Act, 21 U.S.C.section 360bbb-3(b)(1), unless the authorization is terminated  or revoked sooner.       Influenza A by PCR NEGATIVE NEGATIVE Final   Influenza B by PCR NEGATIVE NEGATIVE Final    Comment: (NOTE) The Xpert Xpress SARS-CoV-2/FLU/RSV plus assay is intended as an aid in the diagnosis of influenza from Nasopharyngeal swab specimens and should not be used as a sole basis for treatment.  Nasal washings and aspirates are unacceptable for Xpert Xpress SARS-CoV-2/FLU/RSV testing.  Fact Sheet for Patients: bloggercourse.com  Fact Sheet for Healthcare Providers: seriousbroker.it  This test is not yet approved or cleared by the [place]  FDA and has been authorized for detection and/or diagnosis of SARS-CoV-2 by FDA under an Emergency Use Authorization (EUA). This EUA will remain in effect (meaning this test can be used) for the duration of the COVID-19 declaration under Section 564(b)(1) of the Act, 21 U.S.C. section 360bbb-3(b)(1), unless the authorization is terminated or revoked.     Resp Syncytial Virus by PCR NEGATIVE NEGATIVE Final    Comment: (NOTE) Fact Sheet for Patients: bloggercourse.com  Fact Sheet for Healthcare Providers: seriousbroker.it  This test is not yet approved or cleared by the [place]  FDA and has been authorized for detection and/or diagnosis of SARS-CoV-2 by FDA under an Emergency Use Authorization (EUA). This EUA will remain in effect (meaning this test can be used) for the duration of the COVID-19 declaration under Section 564(b)(1) of the Act, 21 U.S.C. section 360bbb-3(b)(1), unless the authorization is terminated or revoked.  Performed at [facility], [address], [place], [place] [address]          Radiology Studies: DG Chest 1 View Result Date: 03/15/2024 EXAM: 1 VIEW(S) XRAY OF THE CHEST 03/15/2024 02:55:00 PM COMPARISON: None available. CLINICAL HISTORY: sob FINDINGS: LUNGS AND PLEURA: Left mid lung linear atelectasis/scarring. Coarsened interstitial markings without pulmonary edema. No pleural effusion. No pneumothorax. HEART AND MEDIASTINUM: Atherosclerotic plaque. No acute abnormality of the cardiac and mediastinal silhouettes. BONES AND SOFT TISSUES: Old healed right rib fractures. IMPRESSION: 1. No acute  cardiopulmonary abnormality. 2. Left mid lung linear atelectasis/scarring. Electronically signed by: Greig Pique MD 03/15/2024 03:15 PM EST RP Workstation: [id number]        Scheduled Meds:  aspirin  EC  81 mg Oral Daily   atorvastatin   20 mg Oral Daily   budesonide   0.25 mg Nebulization BID   busPIRone   5 mg Oral BID   carvedilol   6.25 mg Oral BID WC   citalopram   40 mg Oral Daily   empagliflozin   25 mg Oral QAC breakfast   enoxaparin  (LOVENOX ) injection  40 mg Subcutaneous Q24H   guaiFENesin   600 mg Oral BID   insulin  aspart  0-15 Units Subcutaneous TID WC   insulin  aspart  0-5 Units Subcutaneous QHS   insulin  glargine  20 Units Subcutaneous Daily   ipratropium-albuterol   3 mL Nebulization TID   LORazepam   1-2 mg Oral QHS   [START ON 03/17/2024] methylPREDNISolone  sodium succinate  40 mg Intravenous Q12H   pantoprazole   40 mg Oral Daily   QUEtiapine   50 mg Oral QHS   Continuous Infusions:  cefTRIAXone  (ROCEPHIN )  IV 1 g (03/15/24 2022)     LOS: 1 day    Time spent: 55 minutes    Haylee Mcanany D Maree, DO [facility]  If 7PM-7AM, please contact night-coverage [email or web address] 03/16/2024, 12:32 PM   "

## 2024-03-16 NOTE — Plan of Care (Signed)

## 2024-03-16 NOTE — Plan of Care (Signed)
   Problem: Activity: Goal: Risk for activity intolerance will decrease Outcome: Progressing   Problem: Coping: Goal: Level of anxiety will decrease Outcome: Progressing

## 2024-03-16 NOTE — Plan of Care (Signed)

## 2024-03-17 DIAGNOSIS — J441 Chronic obstructive pulmonary disease with (acute) exacerbation: Secondary | ICD-10-CM | POA: Diagnosis not present

## 2024-03-17 LAB — BASIC METABOLIC PANEL WITH GFR
Anion gap: 10 (ref 5–15)
BUN: 30 mg/dL — ABNORMAL HIGH (ref 8–23)
CO2: 29 mmol/L (ref 22–32)
Calcium: 8.7 mg/dL — ABNORMAL LOW (ref 8.9–10.3)
Chloride: 101 mmol/L (ref 98–111)
Creatinine, Ser: 0.97 mg/dL (ref 0.44–1.00)
GFR, Estimated: >60 mL/min
Glucose, Bld: 146 mg/dL — ABNORMAL HIGH (ref 70–99)
Potassium: 4.4 mmol/L (ref 3.5–5.1)
Sodium: 140 mmol/L (ref 135–145)

## 2024-03-17 LAB — CBC
HCT: 40.6 % (ref 36.0–46.0)
Hemoglobin: 13.2 g/dL (ref 12.0–15.0)
MCH: 29.1 pg (ref 26.0–34.0)
MCHC: 32.5 g/dL (ref 30.0–36.0)
MCV: 89.6 fL (ref 80.0–100.0)
Platelets: 164 K/uL (ref 150–400)
RBC: 4.53 MIL/uL (ref 3.87–5.11)
RDW: 14.6 % (ref 11.5–15.5)
WBC: 14.1 K/uL — ABNORMAL HIGH (ref 4.0–10.5)
nRBC: 0 % (ref 0.0–0.2)

## 2024-03-17 LAB — GLUCOSE, CAPILLARY
Glucose-Capillary: 159 mg/dL — ABNORMAL HIGH (ref 70–99)
Glucose-Capillary: 241 mg/dL — ABNORMAL HIGH (ref 70–99)

## 2024-03-17 LAB — MAGNESIUM: Magnesium: 2.6 mg/dL — ABNORMAL HIGH (ref 1.7–2.4)

## 2024-03-17 MED ORDER — INSULIN ASPART 100 UNIT/ML IJ SOLN
4.0000 [IU] | Freq: Three times a day (TID) | INTRAMUSCULAR | Status: DC
  Administered 2024-03-17: 4 [IU] via SUBCUTANEOUS
  Filled 2024-03-17: qty 1

## 2024-03-17 MED ORDER — LIVING WELL WITH DIABETES BOOK: Freq: Once | Status: AC

## 2024-03-17 MED ORDER — PREDNISONE 10 MG PO TABS: 40.0000 mg | ORAL_TABLET | Freq: Every day | ORAL | 0 refills | Status: AC

## 2024-03-17 NOTE — Discharge Summary (Signed)
 Physician Discharge Summary  Emily [name] FMW:[id number] DOB: 1960-09-10 DOA: 03/15/2024  PCP: Kennyth Worth HERO, MD  Admit date: 03/15/2024  Discharge date: 03/17/2024  Admitted From:Home  Disposition:  Home  Recommendations for Outpatient Follow-up:  Follow up with PCP in 1-2 weeks Remain on prednisone  as prescribed for 5 days Use breathing treatments at home as needed Continue home medications as prior  Home Health: None  Equipment/Devices: None  Discharge Condition:Stable  CODE STATUS: Full  Diet recommendation: Heart Healthy/carb modified  Brief/Interim Summary: Emily [name]  is a 64 y.o. female, with medical history significant of hypertension, hyperlipidemia, T2DM GERD, CHF, COPD, history of with liver disease and chronic pain due to complex regional pain syndrome of right lower extremity . - Patient presents to ED secondary to complaints of shortness of breath, cough, congestion, symptoms ongoing for last 3 days, patient reports she stopped smoking a week ago, and was only smoking 1 cigarette/day till then, she presents with progressive dyspnea, cough, reporting gray/yellow in color, she does report some chest tightness related to cough and dyspnea.  Patient was admitted with acute hypoxemic respiratory failure in the setting of acute COPD exacerbation.  She has now been weaned off of her oxygen requirements and is stable for discharge with home oral prednisone  and breathing treatments as needed.  No other acute events or concerns noted.  Discharge Diagnoses:  Principal Problem:   COPD exacerbation (HCC) Active Problems:   Type 2 diabetes mellitus with hyperglycemia (HCC)   GERD (gastroesophageal reflux disease)   Chronic combined systolic and diastolic CHF (congestive heart failure) (HCC)   Essential hypertension   Chronic pain secondary to Thoracic compression fracture [facility])  Principal discharge diagnosis: Acute hypoxemic respiratory failure secondary to acute COPD  exacerbation.  Discharge Instructions  Discharge Instructions     Increase activity slowly   Complete by: As directed       Allergies as of 03/17/2024       Reactions   Contrast Media [iodinated Contrast Media] Anaphylaxis   Gadolinium Shortness Of Breath, Other (See Comments)    Code: SOB, Onset Date: 91967988   Iodine-131    Other reaction(s): Angioedema (ALLERGY/intolerance)        Medication List     TAKE these medications    Accu-Chek Guide Test test strip Generic drug: glucose blood Used TID or PRN for glucose checks Dx E11.65   Accu-Chek Softclix Lancets lancets Use to check glucose TID or PRN   albuterol  108 (90 Base) MCG/ACT inhaler Commonly known as: VENTOLIN  HFA INHALE 2 PUFFS BY MOUTH EVERY 6 HOURS AS NEEDED FOR WHEEZING FOR SHORTNESS OF BREATH What changed: See the new instructions.   aspirin  EC 81 MG tablet Take 1 tablet (81 mg total) by mouth daily.   atorvastatin  20 MG tablet Commonly known as: LIPITOR TAKE 1 TABLET BY MOUTH ONCE DAILY . APPOINTMENT REQUIRED FOR FUTURE REFILLS   Blood Glucose Monitoring Suppl Devi 1 each by Does not apply route as directed. Dispense based on patient and insurance preference. Use up to four times daily as directed. (FOR ICD-10 E10.9, E11.9).   budesonide  0.25 MG/2ML nebulizer solution Commonly known as: Pulmicort  Combine with albuterol  in nebulizer twice daily   busPIRone  5 MG tablet Commonly known as: BUSPAR  Take 1 tablet by mouth twice daily   carvedilol  6.25 MG tablet Commonly known as: COREG  TAKE 1 TABLET BY MOUTH TWICE DAILY WITH A MEAL   citalopram  40 MG tablet Commonly known as: CELEXA  Take 1 tablet by  mouth once daily   empagliflozin  25 MG Tabs tablet Commonly known as: Jardiance  Take 1 tablet (25 mg total) by mouth daily before breakfast.   Entresto  24-26 MG Generic drug: sacubitril -valsartan  Take 1 tablet by mouth twice daily   hydrOXYzine  50 MG tablet Commonly known as: ATARAX  TAKE 1  TABLET BY MOUTH THREE TIMES DAILY AS NEEDED FOR ITCHING   ipratropium-albuterol  0.5-2.5 (3) MG/3ML Soln Commonly known as: DUONEB Take 3 mLs by nebulization every 6 (six) hours as needed.   LORazepam  1 MG tablet Commonly known as: ATIVAN  Take 1-2 tablets (1-2 mg total) by mouth at bedtime. Do not take with pain medications. What changed: how much to take   metFORMIN  500 MG 24 hr tablet Commonly known as: GLUCOPHAGE -XR Take 2 tablets by mouth twice daily   oxyCODONE  5 MG immediate release tablet Commonly known as: Roxicodone  Take 1 tablet (5 mg total) by mouth every 6 (six) hours as needed for severe pain (pain score 7-10).   predniSONE  10 MG tablet Commonly known as: DELTASONE  Take  4 each am x 2 days,   2 each am x 2 days,  1 each am x 2 days and stop What changed: Another medication with the same name was added. Make sure you understand how and when to take each.   predniSONE  10 MG tablet Commonly known as: DELTASONE  Take 4 tablets (40 mg total) by mouth daily for 5 days. What changed: You were already taking a medication with the same name, and this prescription was added. Make sure you understand how and when to take each.   QUEtiapine  50 MG tablet Commonly known as: SEROquel  Take 1 tablet (50 mg total) by mouth at bedtime.   Tresiba  FlexTouch 100 UNIT/ML FlexTouch Pen Generic drug: insulin  degludec Inject 30 Units into the skin daily.   triamcinolone  cream 0.1 % Commonly known as: KENALOG  Apply topically 2 (two) times daily.        Follow-up Information     Kennyth Worth HERO, MD. Schedule an appointment as soon as possible for a visit in 1 week.   Specialty: Family Medicine Contact information: [address] [place] [address] [phone]                Allergies[1]  Consultations: None   Procedures/Studies: DG Chest 1 View Result Date: 03/15/2024 EXAM: 1 VIEW(S) XRAY OF THE CHEST 03/15/2024 02:55:00 PM COMPARISON: None available. CLINICAL  HISTORY: sob FINDINGS: LUNGS AND PLEURA: Left mid lung linear atelectasis/scarring. Coarsened interstitial markings without pulmonary edema. No pleural effusion. No pneumothorax. HEART AND MEDIASTINUM: Atherosclerotic plaque. No acute abnormality of the cardiac and mediastinal silhouettes. BONES AND SOFT TISSUES: Old healed right rib fractures. IMPRESSION: 1. No acute cardiopulmonary abnormality. 2. Left mid lung linear atelectasis/scarring. Electronically signed by: Greig Pique MD 03/15/2024 03:15 PM EST RP Workstation: [id number]     Discharge Exam: Vitals:   03/17/24 0837 03/17/24 1059  BP: 113/76   Pulse: 100   Resp:  20  Temp: 97.8 F (36.6 C)   SpO2: 90% 93%   Vitals:   03/17/24 0517 03/17/24 0704 03/17/24 0837 03/17/24 1059  BP: (!) 107/58  113/76   Pulse: 87  100   Resp: 18   20  Temp: 98.2 F (36.8 C)  97.8 F (36.6 C)   TempSrc: Oral  Oral   SpO2: 95% 95% 90% 93%  Weight:      Height:        General: Pt is alert, awake, not in acute  distress Cardiovascular: RRR, S1/S2 +, no rubs, no gallops Respiratory: CTA bilaterally, no wheezing, no rhonchi Abdominal: Soft, NT, ND, bowel sounds + Extremities: no edema, no cyanosis    The results of significant diagnostics from this hospitalization (including imaging, microbiology, ancillary and laboratory) are listed below for reference.     Microbiology: Recent Results (from the past 240 hours)  Resp panel by RT-PCR (RSV, Flu A&B, Covid) Anterior Nasal Swab     Status: None   Collection Time: 03/15/24  4:38 PM   Specimen: Anterior Nasal Swab  Result Value Ref Range Status   SARS Coronavirus 2 by RT PCR NEGATIVE NEGATIVE Final    Comment: (NOTE) SARS-CoV-2 target nucleic acids are NOT DETECTED.  The SARS-CoV-2 RNA is generally detectable in upper respiratory specimens during the acute phase of infection. The lowest concentration of SARS-CoV-2 viral copies this assay can detect is 138 copies/mL. A negative result does  not preclude SARS-Cov-2 infection and should not be used as the sole basis for treatment or other patient management decisions. A negative result may occur with  improper specimen collection/handling, submission of specimen other than nasopharyngeal swab, presence of viral mutation(s) within the areas targeted by this assay, and inadequate number of viral copies(<138 copies/mL). A negative result must be combined with clinical observations, patient history, and epidemiological information. The expected result is Negative.  Fact Sheet for Patients:  bloggercourse.com  Fact Sheet for Healthcare Providers:  seriousbroker.it  This test is no t yet approved or cleared by the [place]  FDA and  has been authorized for detection and/or diagnosis of SARS-CoV-2 by FDA under an Emergency Use Authorization (EUA). This EUA will remain  in effect (meaning this test can be used) for the duration of the COVID-19 declaration under Section 564(b)(1) of the Act, 21 U.S.C.section 360bbb-3(b)(1), unless the authorization is terminated  or revoked sooner.       Influenza A by PCR NEGATIVE NEGATIVE Final   Influenza B by PCR NEGATIVE NEGATIVE Final    Comment: (NOTE) The Xpert Xpress SARS-CoV-2/FLU/RSV plus assay is intended as an aid in the diagnosis of influenza from Nasopharyngeal swab specimens and should not be used as a sole basis for treatment. Nasal washings and aspirates are unacceptable for Xpert Xpress SARS-CoV-2/FLU/RSV testing.  Fact Sheet for Patients: bloggercourse.com  Fact Sheet for Healthcare Providers: seriousbroker.it  This test is not yet approved or cleared by the [place]  FDA and has been authorized for detection and/or diagnosis of SARS-CoV-2 by FDA under an Emergency Use Authorization (EUA). This EUA will remain in effect (meaning this test can be used) for the  duration of the COVID-19 declaration under Section 564(b)(1) of the Act, 21 U.S.C. section 360bbb-3(b)(1), unless the authorization is terminated or revoked.     Resp Syncytial Virus by PCR NEGATIVE NEGATIVE Final    Comment: (NOTE) Fact Sheet for Patients: bloggercourse.com  Fact Sheet for Healthcare Providers: seriousbroker.it  This test is not yet approved or cleared by the [place]  FDA and has been authorized for detection and/or diagnosis of SARS-CoV-2 by FDA under an Emergency Use Authorization (EUA). This EUA will remain in effect (meaning this test can be used) for the duration of the COVID-19 declaration under Section 564(b)(1) of the Act, 21 U.S.C. section 360bbb-3(b)(1), unless the authorization is terminated or revoked.  Performed at [facility], [address], [place], [place] [address]      Labs: BNP (last 3 results) Recent Labs    01/03/24 1444  BNP 9.4  Basic Metabolic Panel: Recent Labs  Lab 03/15/24 1451 03/15/24 1554 03/16/24 0343 03/17/24 0501  NA 136  --  136 140  K 5.7* 5.4* 4.6 4.4  CL 100  --  97* 101  CO2 25  --  22 29  GLUCOSE 159*  --  277* 146*  BUN 13  --  23 30*  CREATININE 0.85  --  0.93 0.97  CALCIUM  8.8*  --  8.8* 8.7*  MG  --   --   --  2.6*   Liver Function Tests: No results for input(s): AST, ALT, ALKPHOS, BILITOT, PROT, ALBUMIN in the last 168 hours. No results for input(s): LIPASE, AMYLASE in the last 168 hours. No results for input(s): AMMONIA in the last 168 hours. CBC: Recent Labs  Lab 03/15/24 1451 03/16/24 0343 03/17/24 0501  WBC 9.6 11.5* 14.1*  NEUTROABS 6.1  --   --   HGB 14.9 13.7 13.2  HCT 46.8* 42.3 40.6  MCV 89.8 89.8 89.6  PLT 201 166 164   Cardiac Enzymes: No results for input(s): CKTOTAL, CKMB, CKMBINDEX, TROPONINI in the last 168 hours. BNP: Invalid input(s): POCBNP CBG: Recent Labs  Lab 03/16/24 1103  03/16/24 1617 03/16/24 2039 03/17/24 0727 03/17/24 1145  GLUCAP 208* 357* 432* 159* 241*   D-Dimer No results for input(s): DDIMER in the last 72 hours. Hgb A1c Recent Labs    03/16/24 0343  HGBA1C 9.9*   Lipid Profile No results for input(s): CHOL, HDL, LDLCALC, TRIG, CHOLHDL, LDLDIRECT in the last 72 hours. Thyroid  function studies No results for input(s): TSH, T4TOTAL, T3FREE, THYROIDAB in the last 72 hours.  Invalid input(s): FREET3 Anemia work up No results for input(s): VITAMINB12, FOLATE, FERRITIN, TIBC, IRON, RETICCTPCT in the last 72 hours. Urinalysis    Component Value Date/Time   COLORURINE YELLOW 08/25/2022 1133   APPEARANCEUR CLEAR 08/25/2022 1133   LABSPEC 1.021 08/25/2022 1133   PHURINE 5.0 08/25/2022 1133   GLUCOSEU >=500 (A) 08/25/2022 1133   GLUCOSEU >=1000 (A) 08/22/2022 0935   HGBUR NEGATIVE 08/25/2022 1133   BILIRUBINUR negative 10/02/2023 1149   BILIRUBINUR neg 11/08/2011 0000   KETONESUR NEGATIVE 08/25/2022 1133   PROTEINUR Negative 10/02/2023 1149   PROTEINUR NEGATIVE 08/25/2022 1133   UROBILINOGEN 0.2 10/02/2023 1149   UROBILINOGEN 0.2 08/22/2022 0935   NITRITE negative 10/02/2023 1149   NITRITE NEGATIVE 08/25/2022 1133   LEUKOCYTESUR Negative 10/02/2023 1149   LEUKOCYTESUR NEGATIVE 08/25/2022 1133   LEUKOCYTESUR Trace 11/08/2011 0000   Sepsis Labs Recent Labs  Lab 03/15/24 1451 03/16/24 0343 03/17/24 0501  WBC 9.6 11.5* 14.1*   Microbiology Recent Results (from the past 240 hours)  Resp panel by RT-PCR (RSV, Flu A&B, Covid) Anterior Nasal Swab     Status: None   Collection Time: 03/15/24  4:38 PM   Specimen: Anterior Nasal Swab  Result Value Ref Range Status   SARS Coronavirus 2 by RT PCR NEGATIVE NEGATIVE Final    Comment: (NOTE) SARS-CoV-2 target nucleic acids are NOT DETECTED.  The SARS-CoV-2 RNA is generally detectable in upper respiratory specimens during the acute phase of infection.  The lowest concentration of SARS-CoV-2 viral copies this assay can detect is 138 copies/mL. A negative result does not preclude SARS-Cov-2 infection and should not be used as the sole basis for treatment or other patient management decisions. A negative result may occur with  improper specimen collection/handling, submission of specimen other than nasopharyngeal swab, presence of viral mutation(s) within the areas targeted by this assay, and inadequate  number of viral copies(<138 copies/mL). A negative result must be combined with clinical observations, patient history, and epidemiological information. The expected result is Negative.  Fact Sheet for Patients:  bloggercourse.com  Fact Sheet for Healthcare Providers:  seriousbroker.it  This test is no t yet approved or cleared by the [place]  FDA and  has been authorized for detection and/or diagnosis of SARS-CoV-2 by FDA under an Emergency Use Authorization (EUA). This EUA will remain  in effect (meaning this test can be used) for the duration of the COVID-19 declaration under Section 564(b)(1) of the Act, 21 U.S.C.section 360bbb-3(b)(1), unless the authorization is terminated  or revoked sooner.       Influenza A by PCR NEGATIVE NEGATIVE Final   Influenza B by PCR NEGATIVE NEGATIVE Final    Comment: (NOTE) The Xpert Xpress SARS-CoV-2/FLU/RSV plus assay is intended as an aid in the diagnosis of influenza from Nasopharyngeal swab specimens and should not be used as a sole basis for treatment. Nasal washings and aspirates are unacceptable for Xpert Xpress SARS-CoV-2/FLU/RSV testing.  Fact Sheet for Patients: bloggercourse.com  Fact Sheet for Healthcare Providers: seriousbroker.it  This test is not yet approved or cleared by the [place]  FDA and has been authorized for detection and/or diagnosis of SARS-CoV-2 by FDA  under an Emergency Use Authorization (EUA). This EUA will remain in effect (meaning this test can be used) for the duration of the COVID-19 declaration under Section 564(b)(1) of the Act, 21 U.S.C. section 360bbb-3(b)(1), unless the authorization is terminated or revoked.     Resp Syncytial Virus by PCR NEGATIVE NEGATIVE Final    Comment: (NOTE) Fact Sheet for Patients: bloggercourse.com  Fact Sheet for Healthcare Providers: seriousbroker.it  This test is not yet approved or cleared by the [place]  FDA and has been authorized for detection and/or diagnosis of SARS-CoV-2 by FDA under an Emergency Use Authorization (EUA). This EUA will remain in effect (meaning this test can be used) for the duration of the COVID-19 declaration under Section 564(b)(1) of the Act, 21 U.S.C. section 360bbb-3(b)(1), unless the authorization is terminated or revoked.  Performed at [facility], [address] [place], [place] [address]      Time coordinating discharge: 35 minutes  SIGNED:   Adron JONETTA Fairly, DO [facility] 03/17/2024, 12:54 PM  If 7PM-7AM, please contact night-coverage [email or web address]     [1]  Allergies Allergen Reactions   Contrast Media [Iodinated Contrast Media] Anaphylaxis   Gadolinium Shortness Of Breath and Other (See Comments)     Code: SOB, Onset Date: 91967988    Iodine-131     Other reaction(s): Angioedema (ALLERGY/intolerance)

## 2024-03-17 NOTE — Progress Notes (Signed)
 " PROGRESS NOTE    Emily [name]  FMW:[id number] DOB: 1960/08/22 DOA: 03/15/2024 PCP: Kennyth Worth HERO, MD   Brief Narrative:     Emily [name]  is a 64 y.o. female, with medical history significant of hypertension, hyperlipidemia, T2DM GERD, CHF, COPD, history of with liver disease and chronic pain due to complex regional pain syndrome of right lower extremity . - Patient presents to ED secondary to complaints of shortness of breath, cough, congestion, symptoms ongoing for last 3 days, patient reports she stopped smoking a week ago, and was only smoking 1 cigarette/day till then, she presents with progressive dyspnea, cough, reporting gray/yellow in color, she does report some chest tightness related to cough and dyspnea.  Patient was admitted with acute hypoxemic respiratory failure in the setting of acute COPD exacerbation.  She continues to have an oxygen requirement and typically is on room air at home.  Assessment & Plan:   Principal Problem:   COPD exacerbation (HCC) Active Problems:   Type 2 diabetes mellitus with hyperglycemia (HCC)   GERD (gastroesophageal reflux disease)   Chronic combined systolic and diastolic CHF (congestive heart failure) (HCC)   Essential hypertension   Chronic pain secondary to Thoracic compression fracture (HCC)  Assessment and Plan:   Acute exacerbation of COPD Acute respiratory failure with hypoxia - Presents with dyspnea, wheezing, hypoxic 87% with activity, upon my evaluation she was 87% at rest -Continue with home budesonide , will change DuoNebs. - Reflectively will keep on Rocephin  - Continue with IV Solu-Medrol  40 mg IV twice daily - Continue with scheduled DuoNebs. - Continue with as needed albuterol . - Wean oxygen as tolerated - Respiratory panel negative   Essential hypertension -Continue with Coreg , will add as needed hydralazine . - Hold Entresto  due to hyperkalemia   Hyperkalemia-resolved - Continue to follow BMP   Mixed  hyperlipidemia -Continue with home statin   Chronic combined systolic and diastolic CHF -Appears to be euvolemic . - Continue with Coreg , Jardiance , will hold Entresto  due to hyperkalemia .   GERD Continue Protonix    Chronic pain syndrome - Tried Vicodin without relief, resume home oxycodone  along with Atarax  and Ativan    Type 2 diabetes mellitus with hyperglycemia -Will continue with Lantus  at a lower dose, will add insulin  sliding scale -Continue with Jardiance    History tobacco abuse -Reports she stopped smoking at new year, she was congratulated    DVT prophylaxis:Lovenox  Code Status: Full Family Communication: None at bedside Disposition Plan:  Status is: Inpatient Remains inpatient appropriate because: Need for IV medications.   Consultants:  None  Procedures:  None  Antimicrobials:  Anti-infectives (From admission, onward)    Start     Dose/Rate Route Frequency Ordered Stop   03/15/24 1830  cefTRIAXone  (ROCEPHIN ) 1 g in sodium chloride  0.9 % 100 mL IVPB        1 g 200 mL/hr over 30 Minutes Intravenous Every 24 hours 03/15/24 1825        Subjective: Patient seen and evaluated today with improvements in shortness of breath, but she has not been weaned off of oxygen as of yet.  Coughing is improving.  Objective: Vitals:   03/17/24 0517 03/17/24 0704 03/17/24 0837 03/17/24 1059  BP: (!) 107/58  113/76   Pulse: 87  100   Resp: 18   20  Temp: 98.2 F (36.8 C)  97.8 F (36.6 C)   TempSrc: Oral  Oral   SpO2: 95% 95% 90% 93%  Weight:      Height:  Intake/Output Summary (Last 24 hours) at 03/17/2024 1141 Last data filed at 03/17/2024 0800 Gross per 24 hour  Intake 400 ml  Output --  Net 400 ml   Filed Weights   03/15/24 1446  Weight: 72.6 kg    Examination:  General exam: Appears calm and comfortable  Respiratory system: Mild wheezing bilaterally. Respiratory effort normal.  2.5 L nasal cannula. Cardiovascular system: S1 & S2 heard, RRR.   Gastrointestinal system: Abdomen is soft Central nervous system: Alert and awake Extremities: No edema Skin: No significant lesions noted Psychiatry: Flat affect.    Data Reviewed: I have personally reviewed following labs and imaging studies  CBC: Recent Labs  Lab 03/15/24 1451 03/16/24 0343 03/17/24 0501  WBC 9.6 11.5* 14.1*  NEUTROABS 6.1  --   --   HGB 14.9 13.7 13.2  HCT 46.8* 42.3 40.6  MCV 89.8 89.8 89.6  PLT 201 166 164   Basic Metabolic Panel: Recent Labs  Lab 03/15/24 1451 03/15/24 1554 03/16/24 0343 03/17/24 0501  NA 136  --  136 140  K 5.7* 5.4* 4.6 4.4  CL 100  --  97* 101  CO2 25  --  22 29  GLUCOSE 159*  --  277* 146*  BUN 13  --  23 30*  CREATININE 0.85  --  0.93 0.97  CALCIUM  8.8*  --  8.8* 8.7*  MG  --   --   --  2.6*   GFR: Estimated Creatinine Clearance: 57.7 mL/min (by C-G formula based on SCr of 0.97 mg/dL). Liver Function Tests: No results for input(s): AST, ALT, ALKPHOS, BILITOT, PROT, ALBUMIN in the last 168 hours. No results for input(s): LIPASE, AMYLASE in the last 168 hours. No results for input(s): AMMONIA in the last 168 hours. Coagulation Profile: No results for input(s): INR, PROTIME in the last 168 hours. Cardiac Enzymes: No results for input(s): CKTOTAL, CKMB, CKMBINDEX, TROPONINI in the last 168 hours. BNP (last 3 results) Recent Labs    03/15/24 1451  PROBNP 71.5   HbA1C: Recent Labs    03/16/24 0343  HGBA1C 9.9*   CBG: Recent Labs  Lab 03/16/24 0709 03/16/24 1103 03/16/24 1617 03/16/24 2039 03/17/24 0727  GLUCAP 268* 208* 357* 432* 159*   Lipid Profile: No results for input(s): CHOL, HDL, LDLCALC, TRIG, CHOLHDL, LDLDIRECT in the last 72 hours. Thyroid  Function Tests: No results for input(s): TSH, T4TOTAL, FREET4, T3FREE, THYROIDAB in the last 72 hours. Anemia Panel: No results for input(s): VITAMINB12, FOLATE, FERRITIN, TIBC, IRON,  RETICCTPCT in the last 72 hours. Sepsis Labs: No results for input(s): PROCALCITON, LATICACIDVEN in the last 168 hours.  Recent Results (from the past 240 hours)  Resp panel by RT-PCR (RSV, Flu A&B, Covid) Anterior Nasal Swab     Status: None   Collection Time: 03/15/24  4:38 PM   Specimen: Anterior Nasal Swab  Result Value Ref Range Status   SARS Coronavirus 2 by RT PCR NEGATIVE NEGATIVE Final    Comment: (NOTE) SARS-CoV-2 target nucleic acids are NOT DETECTED.  The SARS-CoV-2 RNA is generally detectable in upper respiratory specimens during the acute phase of infection. The lowest concentration of SARS-CoV-2 viral copies this assay can detect is 138 copies/mL. A negative result does not preclude SARS-Cov-2 infection and should not be used as the sole basis for treatment or other patient management decisions. A negative result may occur with  improper specimen collection/handling, submission of specimen other than nasopharyngeal swab, presence of viral mutation(s) within the areas targeted by  this assay, and inadequate number of viral copies(<138 copies/mL). A negative result must be combined with clinical observations, patient history, and epidemiological information. The expected result is Negative.  Fact Sheet for Patients:  bloggercourse.com  Fact Sheet for Healthcare Providers:  seriousbroker.it  This test is no t yet approved or cleared by the [place]  FDA and  has been authorized for detection and/or diagnosis of SARS-CoV-2 by FDA under an Emergency Use Authorization (EUA). This EUA will remain  in effect (meaning this test can be used) for the duration of the COVID-19 declaration under Section 564(b)(1) of the Act, 21 U.S.C.section 360bbb-3(b)(1), unless the authorization is terminated  or revoked sooner.       Influenza A by PCR NEGATIVE NEGATIVE Final   Influenza B by PCR NEGATIVE NEGATIVE Final     Comment: (NOTE) The Xpert Xpress SARS-CoV-2/FLU/RSV plus assay is intended as an aid in the diagnosis of influenza from Nasopharyngeal swab specimens and should not be used as a sole basis for treatment. Nasal washings and aspirates are unacceptable for Xpert Xpress SARS-CoV-2/FLU/RSV testing.  Fact Sheet for Patients: bloggercourse.com  Fact Sheet for Healthcare Providers: seriousbroker.it  This test is not yet approved or cleared by the [place]  FDA and has been authorized for detection and/or diagnosis of SARS-CoV-2 by FDA under an Emergency Use Authorization (EUA). This EUA will remain in effect (meaning this test can be used) for the duration of the COVID-19 declaration under Section 564(b)(1) of the Act, 21 U.S.C. section 360bbb-3(b)(1), unless the authorization is terminated or revoked.     Resp Syncytial Virus by PCR NEGATIVE NEGATIVE Final    Comment: (NOTE) Fact Sheet for Patients: bloggercourse.com  Fact Sheet for Healthcare Providers: seriousbroker.it  This test is not yet approved or cleared by the [place]  FDA and has been authorized for detection and/or diagnosis of SARS-CoV-2 by FDA under an Emergency Use Authorization (EUA). This EUA will remain in effect (meaning this test can be used) for the duration of the COVID-19 declaration under Section 564(b)(1) of the Act, 21 U.S.C. section 360bbb-3(b)(1), unless the authorization is terminated or revoked.  Performed at [facility], [address] [place], [place] [address]          Radiology Studies: DG Chest 1 View Result Date: 03/15/2024 EXAM: 1 VIEW(S) XRAY OF THE CHEST 03/15/2024 02:55:00 PM COMPARISON: None available. CLINICAL HISTORY: sob FINDINGS: LUNGS AND PLEURA: Left mid lung linear atelectasis/scarring. Coarsened interstitial markings without pulmonary edema. No pleural effusion. No  pneumothorax. HEART AND MEDIASTINUM: Atherosclerotic plaque. No acute abnormality of the cardiac and mediastinal silhouettes. BONES AND SOFT TISSUES: Old healed right rib fractures. IMPRESSION: 1. No acute cardiopulmonary abnormality. 2. Left mid lung linear atelectasis/scarring. Electronically signed by: Greig Pique MD 03/15/2024 03:15 PM EST RP Workstation: [id number]        Scheduled Meds:  aspirin  EC  81 mg Oral Daily   atorvastatin   20 mg Oral Daily   budesonide   0.25 mg Nebulization BID   busPIRone   5 mg Oral BID   carvedilol   6.25 mg Oral BID WC   citalopram   40 mg Oral Daily   empagliflozin   25 mg Oral QAC breakfast   enoxaparin  (LOVENOX ) injection  40 mg Subcutaneous Q24H   guaiFENesin   600 mg Oral BID   insulin  aspart  0-15 Units Subcutaneous TID WC   insulin  aspart  0-5 Units Subcutaneous QHS   insulin  aspart  4 Units Subcutaneous TID WC   insulin  glargine  20 Units Subcutaneous  Daily   ipratropium-albuterol   3 mL Nebulization TID   LORazepam   1-2 mg Oral QHS   methylPREDNISolone  sodium succinate  40 mg Intravenous Q12H   pantoprazole   40 mg Oral Daily   QUEtiapine   50 mg Oral QHS   Continuous Infusions:  cefTRIAXone  (ROCEPHIN )  IV 1 g (03/16/24 1703)     LOS: 2 days    Time spent: 55 minutes    Ahaan Zobrist D Maree, DO [facility]  If 7PM-7AM, please contact night-coverage [email or web address] 03/17/2024, 11:41 AM   "

## 2024-03-17 NOTE — Progress Notes (Signed)
Patient discharged home with instructions given on medications and follow up visits,verbalized understanding .Prescriptions sent to Pharmacy of choice documented on AVS.IV discontinued,catheter intact. Accompanied by staff to an awaiting vehicle.

## 2024-03-17 NOTE — Inpatient Diabetes Management (Addendum)
 Inpatient Diabetes Program Recommendations  AACE/ADA: New Consensus Statement on Inpatient Glycemic Control (2015)  Target Ranges:  Prepandial:   less than 140 mg/dL      Peak postprandial:   less than 180 mg/dL (1-2 hours)      Critically ill patients:  140 - 180 mg/dL   Lab Results  Component Value Date   GLUCAP 159 (H) 03/17/2024   HGBA1C 9.9 (H) 03/16/2024    Latest Reference Range & Units 03/16/24 07:09 03/16/24 11:03 03/16/24 16:17 03/16/24 20:39 03/17/24 07:27  Glucose-Capillary 70 - 99 mg/dL 731 (H) 791 (H) 642 (H) 432 (H) 159 (H)  (H): Data is abnormally high  Diabetes history: DM2 Outpatient Diabetes medications:  Tresiba  30 units daily Jardiance  25 mg daily Metformin  1 gm bid Current orders for Inpatient glycemic control: Lantus  20 units daily Novolog  0-15 units tid, 0-5 units hs Jardiance  25 mg daily  Solumedrol 40 mg q 12 hrs.  Inpatient Diabetes Program Recommendations:   While on steroids, please consider: -Add Novolog  4 units tid meal coverage if eats 50%  Living Well With Diabetes booklet ordered for patient review.  Thank you, Justyce Yeater E. Wafa Martes, RN, MSN, CNS, CDCES  Diabetes Coordinator Inpatient Glycemic Control Team Team Pager [phone] (8am-5pm) 03/17/2024 9:18 AM

## 2024-03-17 NOTE — Progress Notes (Signed)
 Mobility Specialist Progress Note:    03/17/24 1228  Mobility  Activity Ambulated with assistance  Level of Assistance Contact guard assist, steadying assist  Assistive Device None  Distance Ambulated (ft) 140 ft  Range of Motion/Exercises Active;All extremities  Activity Response Tolerated well  Mobility Referral Yes  Mobility visit 1 Mobility  Mobility Specialist Start Time (ACUTE ONLY) 1228  Mobility Specialist Stop Time (ACUTE ONLY) 1248  Mobility Specialist Time Calculation (min) (ACUTE ONLY) 20 min   Pt received in bed, agreeable to mobility. Required CGA to stand and ambulate with no AD. Tolerated well, denies SOB. SpO2 92% on RA at rest, SpO2 88-91% on RA during ambulation, once O2 dropped to 88% we took a short rest break and O2 recovered to 91%. Left sitting EOB, all needs met.  Cloris Flippo Mobility Specialist Please contact via Special Educational Needs Teacher or  Rehab office at [phone]

## 2024-03-17 NOTE — Progress Notes (Signed)
 Patient admitted for COPD Exac, Inpatient Care Manager (ICM) conducted chart review and visited with patient at bedside to complete brief admission assessment.    Patient confirmed she lives alone in handicapped accessible Apt at [employer], endorses independent with ADL's with only intermittent use of her Cane and Nebulizer, denied any additional DME at home. Patient can drive but states care broke down so uncle drives her to appts and she utilizes grocery delivery for food.   No immediate discharge needs identified at this time. However, IPCM team will continue to follow along and monitor patient advancement through interdisciplinary progression rounds. If transition of care needs arise, please enter a ICM consult to prompt IPCM team to follow up.     03/17/24 1234  TOC Brief Assessment  Insurance and Status Reviewed  Patient has primary care physician Yes  Home environment has been reviewed Home Alone  Prior level of function: Independent with intermittent use of cane  Prior/Current Home Services No current home services  Social Drivers of Health Review SDOH reviewed no interventions necessary  Readmission risk has been reviewed Yes  Transition of care needs no transition of care needs at this time

## 2024-03-17 NOTE — Progress Notes (Signed)
 Patient giving Living Well with Diabetes booklet.Plan of care on going.

## 2024-03-18 ENCOUNTER — Telehealth: Payer: Self-pay | Admitting: *Deleted

## 2024-03-18 NOTE — Transitions of Care (Post Inpatient/ED Visit) (Signed)
" ° °  03/18/2024  Name: Emily [name] MRN: [id number] DOB: 27-Dec-1960  Today's TOC FU Call Status: Today's TOC FU Call Status:: Unsuccessful Call (1st Attempt) Unsuccessful Call (1st Attempt) Date: 03/18/24  Attempted to reach the patient regarding the most recent Inpatient/ED visit.  Follow Up Plan: Additional outreach attempts will be made to reach the patient to complete the Transitions of Care (Post Inpatient/ED visit) call.   Cathlean Headland BSN RN [facility] Health Care Management Coordinator [email or web address] Direct Dial: [phone]  Fax: [phone] Website: [email or web address]  "

## 2024-03-20 ENCOUNTER — Telehealth: Payer: Self-pay

## 2024-03-20 NOTE — Transitions of Care (Post Inpatient/ED Visit) (Signed)
" ° °  03/20/2024  Name: Emily [name] MRN: [id number] DOB: 03-12-1960  Today's TOC FU Call Status: Today's TOC FU Call Status:: Successful TOC FU Call Completed TOC FU Call Complete Date: 03/20/24  Patient's Name and Date of Birth confirmed. Name, DOB  Transition Care Management Follow-up Telephone Call Date of Discharge: 03/17/24 [facility] Type of Discharge: Inpatient Admission Primary Inpatient Discharge Diagnosis:: COPD exacerbation How have you been since you were released from the hospital?: Better Any questions or concerns?: No  Items Reviewed: Did you receive and understand the discharge instructions provided?: Yes Medications obtained,verified, and reconciled?: Yes (Medications Reviewed) Any new allergies since your discharge?: No  Medications Reviewed Today:  verbally states that she has all her medicatiosn- declines review. Medications Reviewed Today   Medications were not reviewed in this encounter       Follow up appointments reviewed: PCP Follow-up appointment confirmed?: Yes Date of PCP follow-up appointment?: 03/25/24 Follow-up Provider: PCP Do you need transportation to your follow-up appointment?: No  Placed call to patient for [facility] call. Explained purpose of call and patient states that she is not interested. States she has her medication and hospital follow up scheduled.  Alan Ee, RN, BSN, CEN Population Health- Transition of Care Team.  [facility] [phone]      "

## 2024-03-24 ENCOUNTER — Ambulatory Visit: Payer: Self-pay

## 2024-03-24 NOTE — Telephone Encounter (Signed)
 FYI Only or Action Required?: Action required by provider: request for appointment and update on patient condition.  Patient was last seen in primary care on 01/10/2024 by Kennyth Worth HERO, MD.  Called Nurse Triage reporting Shortness of Breath.  Symptoms began 03/13/24.  Interventions attempted: Prescription medications: Albuterol  and Pulmicort .  Symptoms are: gradually worsening.  Triage Disposition: See PCP When Office is Open (Within 3 Days)  Patient/caregiver understands and will follow disposition?: Message from Emily [name] sent at 03/24/2024  2:36 PM EST  Reason for Triage: Pt stated that she is still experiencing some SOB and wanted to speak with someone in regards to this.   Reason for Disposition  [1] MODERATE longstanding difficulty breathing (e.g., speaks in phrases, SOB even at rest, pulse 100-120) AND [2] SAME as normal  Answer Assessment - Initial Assessment Questions Pt wanted to be seen sooner than when PCP available. Rescheduled pt to a sooner appt different provider       1. RESPIRATORY STATUS: Describe your breathing? (e.g., wheezing, shortness of breath, unable to speak, severe coughing)      SOB at rest and with activity 2. ONSET: When did this breathing problem begin?      1 month  3. PATTERN Does the difficult breathing come and go, or has it been constant since it started?      constant 4. SEVERITY: How bad is your breathing? (e.g., mild, moderate, severe)      Moderate  5. RECURRENT SYMPTOM: Have you had difficulty breathing before? If Yes, ask: When was the last time? and What happened that time?      Yes recent hospitalization  for a few days  6. CARDIAC HISTORY: Do you have any history of heart disease? (e.g., heart attack, angina, bypass surgery, angioplasty)      CHF 7. LUNG HISTORY: Do you have any history of lung disease?  (e.g., pulmonary embolus, asthma, emphysema)     COPD 8. CAUSE: What do you think is causing the breathing  problem?      SOB 9. OTHER SYMPTOMS: Do you have any other symptoms? (e.g., chest pain, cough, dizziness, fever, runny nose)     Intermittent wheezing, cough,  10. O2 SATURATION MONITOR:  Do you use an oxygen saturation monitor (pulse oximeter) at home? If Yes, ask: What is your reading (oxygen level) today? What is your usual oxygen saturation reading? (e.g., 95%)       91 % R/A  Protocols used: Breathing Difficulty-A-AH

## 2024-03-25 ENCOUNTER — Encounter: Payer: Self-pay | Admitting: Physician Assistant

## 2024-03-25 ENCOUNTER — Other Ambulatory Visit: Payer: Self-pay | Admitting: Family Medicine

## 2024-03-25 ENCOUNTER — Inpatient Hospital Stay: Admitting: Family Medicine

## 2024-03-25 ENCOUNTER — Ambulatory Visit (INDEPENDENT_AMBULATORY_CARE_PROVIDER_SITE_OTHER): Admitting: Physician Assistant

## 2024-03-25 ENCOUNTER — Ambulatory Visit

## 2024-03-25 ENCOUNTER — Ambulatory Visit: Payer: Self-pay | Admitting: Internal Medicine

## 2024-03-25 VITALS — BP 100/60 | HR 104 | Temp 97.9°F | Ht 65.0 in | Wt 162.0 lb

## 2024-03-25 DIAGNOSIS — R0602 Shortness of breath: Secondary | ICD-10-CM

## 2024-03-25 DIAGNOSIS — I5042 Chronic combined systolic (congestive) and diastolic (congestive) heart failure: Secondary | ICD-10-CM

## 2024-03-25 DIAGNOSIS — J449 Chronic obstructive pulmonary disease, unspecified: Secondary | ICD-10-CM | POA: Diagnosis not present

## 2024-03-25 LAB — COMPREHENSIVE METABOLIC PANEL WITH GFR
ALT: 20 U/L (ref 3–35)
AST: 13 U/L (ref 5–37)
Albumin: 3.9 g/dL (ref 3.5–5.2)
Alkaline Phosphatase: 54 U/L (ref 39–117)
BUN: 29 mg/dL — ABNORMAL HIGH (ref 6–23)
CO2: 31 meq/L (ref 19–32)
Calcium: 9.1 mg/dL (ref 8.4–10.5)
Chloride: 95 meq/L — ABNORMAL LOW (ref 96–112)
Creatinine, Ser: 1.16 mg/dL (ref 0.40–1.20)
GFR: 49.43 mL/min — ABNORMAL LOW
Glucose, Bld: 363 mg/dL — ABNORMAL HIGH (ref 70–99)
Potassium: 4.5 meq/L (ref 3.5–5.1)
Sodium: 135 meq/L (ref 135–145)
Total Bilirubin: 0.5 mg/dL (ref 0.2–1.2)
Total Protein: 6.7 g/dL (ref 6.0–8.3)

## 2024-03-25 LAB — CBC WITH DIFFERENTIAL/PLATELET
Basophils Absolute: 0.1 10*3/uL (ref 0.0–0.1)
Basophils Relative: 0.5 % (ref 0.0–3.0)
Eosinophils Absolute: 0.3 10*3/uL (ref 0.0–0.7)
Eosinophils Relative: 2.2 % (ref 0.0–5.0)
HCT: 44.7 % (ref 36.0–46.0)
Hemoglobin: 14.8 g/dL (ref 12.0–15.0)
Lymphocytes Relative: 19.6 % (ref 12.0–46.0)
Lymphs Abs: 2.5 10*3/uL (ref 0.7–4.0)
MCHC: 33.1 g/dL (ref 30.0–36.0)
MCV: 88.7 fl (ref 78.0–100.0)
Monocytes Absolute: 0.5 10*3/uL (ref 0.1–1.0)
Monocytes Relative: 4.3 % (ref 3.0–12.0)
Neutro Abs: 9.3 10*3/uL — ABNORMAL HIGH (ref 1.4–7.7)
Neutrophils Relative %: 73.4 % (ref 43.0–77.0)
Platelets: 200 10*3/uL (ref 150.0–400.0)
RBC: 5.04 Mil/uL (ref 3.87–5.11)
RDW: 14.8 % (ref 11.5–15.5)
WBC: 12.6 10*3/uL — ABNORMAL HIGH (ref 4.0–10.5)

## 2024-03-25 LAB — TSH: TSH: 1.22 u[IU]/mL (ref 0.35–5.50)

## 2024-03-25 LAB — BRAIN NATRIURETIC PEPTIDE: Pro B Natriuretic peptide (BNP): 12 pg/mL (ref 1.0–100.0)

## 2024-03-25 MED ORDER — SPACER/AERO-HOLDING CHAMBERS DEVI: 0 refills | Status: AC

## 2024-03-25 MED ORDER — BREZTRI AEROSPHERE 160-9-4.8 MCG/ACT IN AERO: 2.0000 | INHALATION_SPRAY | Freq: Two times a day (BID) | RESPIRATORY_TRACT | 11 refills | Status: AC
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
## 2024-03-25 NOTE — Telephone Encounter (Signed)
 Patient called to schedule transfer of care appt. No triage needed  Message from [place] [facility] sent at 03/25/2024  4:08 PM EST  Reason for Triage: Patient [id number] has COPD and wants to a second opinion, symptoms are trouble breathing, shortness of breath, wheezing, back pain, denies dizziness, nor fever. Patient see Dr. Darlean and wants to see a provider in [place]. Please advise.

## 2024-03-25 NOTE — Patient Instructions (Addendum)
 It was great to see you!  Start daily Breztri  -- Oral inhalation: 2 inhalations twice daily; maximum dose: 2 inhalations twice daily.   I am going to try to send in a spacer for you to administer with you inhaler  I will place new referral to different pulmonary group  We will get chest xray and blood work today  Continue to weigh yourself daily  Call MD: Anytime you have any of the following symptoms: 1) 3 pound weight gain in 24 hours or 5 pounds in 1 week 2) shortness of breath, with or without a dry hacking cough 3) swelling in the hands, feet or stomach 4) if you have to sleep on extra pillows at night in order to breathe.   If you feel any worsening symptom(s), please go to the ER  Take care,  Brinley Treanor PA-C

## 2024-03-25 NOTE — Telephone Encounter (Signed)
 Appt today

## 2024-03-26 ENCOUNTER — Ambulatory Visit: Payer: Self-pay | Admitting: Physician Assistant

## 2024-03-26 NOTE — Telephone Encounter (Signed)
 Called and spoke with the pt  She is asking to switch providers  Wants to come to [facility]  Dr Darlean- please advise if this is okay, thanks!

## 2024-03-26 NOTE — Telephone Encounter (Signed)
 Emily Ozell NOVAK, MD to Me     03/26/24 10:02 AM Fine with me  I called and spoke with the pt and scheduled with [name] 04/14/24  I advised her to seek emergent care sooner if needed should her symptoms persist  She verbalized understanding  States was seen by PCP yesterday  Nothing further needed

## 2024-03-28 ENCOUNTER — Other Ambulatory Visit (HOSPITAL_COMMUNITY): Payer: Self-pay | Admitting: Cardiology

## 2024-03-28 ENCOUNTER — Encounter: Payer: Self-pay | Admitting: Family Medicine

## 2024-03-28 MED ORDER — BREZTRI AEROSPHERE 160-9-4.8 MCG/ACT IN AERO: 2.0000 | INHALATION_SPRAY | Freq: Two times a day (BID) | RESPIRATORY_TRACT | 11 refills | Status: AC

## 2024-03-28 MED ORDER — SACUBITRIL-VALSARTAN 24-26 MG PO TABS: 1.0000 | ORAL_TABLET | Freq: Two times a day (BID) | ORAL | 3 refills | Status: AC

## 2024-03-28 MED ORDER — AZITHROMYCIN 250 MG PO TABS: ORAL_TABLET | ORAL | 0 refills | Status: AC

## 2024-03-28 NOTE — Telephone Encounter (Signed)
 Please advise.

## 2024-03-31 ENCOUNTER — Inpatient Hospital Stay: Admitting: Family Medicine

## 2024-04-01 ENCOUNTER — Other Ambulatory Visit: Payer: Self-pay | Admitting: Family Medicine

## 2024-04-01 ENCOUNTER — Encounter: Payer: Self-pay | Admitting: Family Medicine

## 2024-04-02 ENCOUNTER — Other Ambulatory Visit: Payer: Self-pay | Admitting: *Deleted

## 2024-04-02 MED ORDER — BUSPIRONE HCL 5 MG PO TABS: 5.0000 mg | ORAL_TABLET | Freq: Two times a day (BID) | ORAL | 0 refills | Status: AC

## 2024-04-14 ENCOUNTER — Encounter

## 2024-04-15 ENCOUNTER — Ambulatory Visit: Admitting: Family Medicine

## 2024-05-13 ENCOUNTER — Ambulatory Visit: Admitting: Internal Medicine

## 2024-10-07 ENCOUNTER — Ambulatory Visit
